# Patient Record
Sex: Female | Born: 1935
Health system: Southern US, Community
[De-identification: ages and names within clinical notes are randomized; demographics above are authoritative.]

## PROBLEM LIST (undated history)

## (undated) DIAGNOSIS — M6790 Unspecified disorder of synovium and tendon, unspecified site: Secondary | ICD-10-CM

## (undated) DIAGNOSIS — E039 Hypothyroidism, unspecified: Secondary | ICD-10-CM

## (undated) DIAGNOSIS — I160 Hypertensive urgency: Secondary | ICD-10-CM

## (undated) DIAGNOSIS — T8459XA Infection and inflammatory reaction due to other internal joint prosthesis, initial encounter: Secondary | ICD-10-CM

## (undated) DIAGNOSIS — F410 Panic disorder [episodic paroxysmal anxiety] without agoraphobia: Secondary | ICD-10-CM

## (undated) DIAGNOSIS — Z96649 Presence of unspecified artificial hip joint: Secondary | ICD-10-CM

## (undated) DIAGNOSIS — J40 Bronchitis, not specified as acute or chronic: Secondary | ICD-10-CM

## (undated) DIAGNOSIS — M9701XA Periprosthetic fracture around internal prosthetic right hip joint, initial encounter: Secondary | ICD-10-CM

## (undated) DIAGNOSIS — Z8719 Personal history of other diseases of the digestive system: Secondary | ICD-10-CM

## (undated) DIAGNOSIS — R112 Nausea with vomiting, unspecified: Secondary | ICD-10-CM

## (undated) DIAGNOSIS — M7989 Other specified soft tissue disorders: Secondary | ICD-10-CM

## (undated) DIAGNOSIS — T8859XA Other complications of anesthesia, initial encounter: Secondary | ICD-10-CM

## (undated) DIAGNOSIS — E785 Hyperlipidemia, unspecified: Secondary | ICD-10-CM

## (undated) DIAGNOSIS — T847XXA Infection and inflammatory reaction due to other internal orthopedic prosthetic devices, implants and grafts, initial encounter: Secondary | ICD-10-CM

## (undated) DIAGNOSIS — M1990 Unspecified osteoarthritis, unspecified site: Secondary | ICD-10-CM

## (undated) DIAGNOSIS — I4891 Unspecified atrial fibrillation: Principal | ICD-10-CM

## (undated) DIAGNOSIS — I10 Essential (primary) hypertension: Secondary | ICD-10-CM

## (undated) DIAGNOSIS — E119 Type 2 diabetes mellitus without complications: Secondary | ICD-10-CM

## (undated) DIAGNOSIS — A498 Other bacterial infections of unspecified site: Secondary | ICD-10-CM

## (undated) DIAGNOSIS — G4700 Insomnia, unspecified: Secondary | ICD-10-CM

## (undated) DIAGNOSIS — K219 Gastro-esophageal reflux disease without esophagitis: Secondary | ICD-10-CM

## (undated) DIAGNOSIS — G709 Myoneural disorder, unspecified: Secondary | ICD-10-CM

## (undated) DIAGNOSIS — F32A Depression, unspecified: Secondary | ICD-10-CM

## (undated) DIAGNOSIS — T4145XA Adverse effect of unspecified anesthetic, initial encounter: Secondary | ICD-10-CM

## (undated) DIAGNOSIS — Z9289 Personal history of other medical treatment: Secondary | ICD-10-CM

## (undated) DIAGNOSIS — S7290XA Unspecified fracture of unspecified femur, initial encounter for closed fracture: Secondary | ICD-10-CM

## (undated) DIAGNOSIS — B029 Zoster without complications: Secondary | ICD-10-CM

## (undated) DIAGNOSIS — S72141A Displaced intertrochanteric fracture of right femur, initial encounter for closed fracture: Secondary | ICD-10-CM

## (undated) DIAGNOSIS — C44300 Unspecified malignant neoplasm of skin of unspecified part of face: Secondary | ICD-10-CM

## (undated) DIAGNOSIS — Z9889 Other specified postprocedural states: Secondary | ICD-10-CM

## (undated) DIAGNOSIS — F329 Major depressive disorder, single episode, unspecified: Secondary | ICD-10-CM

## (undated) DIAGNOSIS — Z974 Presence of external hearing-aid: Secondary | ICD-10-CM

## (undated) HISTORY — DX: Other bacterial infections of unspecified site: A49.8

## (undated) HISTORY — DX: Infection and inflammatory reaction due to other internal orthopedic prosthetic devices, implants and grafts, initial encounter: T84.7XXA

## (undated) HISTORY — DX: Presence of unspecified artificial hip joint: Z96.649

## (undated) HISTORY — DX: Hypertensive urgency: I16.0

## (undated) HISTORY — DX: Unspecified disorder of synovium and tendon, unspecified site: M67.90

## (undated) HISTORY — PX: BREAST BIOPSY: SHX20

## (undated) HISTORY — PX: CARPAL TUNNEL RELEASE: SHX101

## (undated) HISTORY — PX: EYE SURGERY: SHX253

## (undated) HISTORY — PX: JOINT REPLACEMENT: SHX530

## (undated) HISTORY — DX: Infection and inflammatory reaction due to other internal joint prosthesis, initial encounter: T84.59XA

---
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
## 1998-12-12 HISTORY — PX: CATARACT EXTRACTION, BILATERAL: SHX1313

## 1999-04-12 ENCOUNTER — Ambulatory Visit (HOSPITAL_COMMUNITY): Admission: RE | Admit: 1999-04-12 | Discharge: 1999-04-12 | Payer: Self-pay | Admitting: Specialist

## 1999-06-21 ENCOUNTER — Ambulatory Visit (HOSPITAL_COMMUNITY): Admission: RE | Admit: 1999-06-21 | Discharge: 1999-06-21 | Payer: Self-pay | Admitting: Specialist

## 1999-08-13 DIAGNOSIS — C44300 Unspecified malignant neoplasm of skin of unspecified part of face: Secondary | ICD-10-CM

## 1999-08-13 HISTORY — DX: Unspecified malignant neoplasm of skin of unspecified part of face: C44.300

## 1999-08-13 HISTORY — PX: SKIN CANCER EXCISION: SHX779

## 2000-05-05 ENCOUNTER — Other Ambulatory Visit: Admission: RE | Admit: 2000-05-05 | Discharge: 2000-05-05 | Payer: Self-pay | Admitting: *Deleted

## 2001-02-14 ENCOUNTER — Ambulatory Visit (HOSPITAL_COMMUNITY): Admission: RE | Admit: 2001-02-14 | Discharge: 2001-02-14 | Payer: Self-pay | Admitting: Internal Medicine

## 2001-02-14 ENCOUNTER — Encounter: Payer: Self-pay | Admitting: Internal Medicine

## 2003-02-27 ENCOUNTER — Encounter: Payer: Self-pay | Admitting: Internal Medicine

## 2003-02-27 ENCOUNTER — Ambulatory Visit (HOSPITAL_COMMUNITY): Admission: RE | Admit: 2003-02-27 | Discharge: 2003-02-27 | Payer: Self-pay | Admitting: Internal Medicine

## 2003-12-13 HISTORY — PX: TOTAL KNEE ARTHROPLASTY: SHX125

## 2004-09-29 ENCOUNTER — Inpatient Hospital Stay (HOSPITAL_COMMUNITY): Admission: RE | Admit: 2004-09-29 | Discharge: 2004-10-02 | Payer: Self-pay | Admitting: Orthopedic Surgery

## 2004-09-29 ENCOUNTER — Ambulatory Visit: Payer: Self-pay | Admitting: Physical Medicine & Rehabilitation

## 2004-10-02 ENCOUNTER — Inpatient Hospital Stay
Admission: RE | Admit: 2004-10-02 | Discharge: 2004-10-07 | Payer: Self-pay | Admitting: Physical Medicine & Rehabilitation

## 2004-12-07 HISTORY — PX: OTHER SURGICAL HISTORY: SHX169

## 2004-12-14 ENCOUNTER — Ambulatory Visit (HOSPITAL_COMMUNITY): Admission: RE | Admit: 2004-12-14 | Discharge: 2004-12-14 | Payer: Self-pay | Admitting: *Deleted

## 2004-12-23 ENCOUNTER — Ambulatory Visit (HOSPITAL_COMMUNITY): Admission: RE | Admit: 2004-12-23 | Discharge: 2004-12-23 | Payer: Self-pay | Admitting: *Deleted

## 2006-01-03 ENCOUNTER — Ambulatory Visit (HOSPITAL_COMMUNITY): Admission: RE | Admit: 2006-01-03 | Discharge: 2006-01-03 | Payer: Self-pay | Admitting: Internal Medicine

## 2011-12-07 HISTORY — PX: DOPPLER ECHOCARDIOGRAPHY: SHX263

## 2011-12-07 HISTORY — PX: CARDIOVASCULAR STRESS TEST: SHX262

## 2011-12-13 DIAGNOSIS — J40 Bronchitis, not specified as acute or chronic: Secondary | ICD-10-CM

## 2011-12-13 HISTORY — DX: Bronchitis, not specified as acute or chronic: J40

## 2011-12-19 ENCOUNTER — Encounter (HOSPITAL_COMMUNITY): Payer: Self-pay

## 2011-12-26 ENCOUNTER — Other Ambulatory Visit (HOSPITAL_COMMUNITY): Payer: Self-pay

## 2011-12-30 ENCOUNTER — Ambulatory Visit (HOSPITAL_COMMUNITY): Admission: RE | Admit: 2011-12-30 | Payer: Medicare Other | Source: Ambulatory Visit | Admitting: Orthopedic Surgery

## 2011-12-30 ENCOUNTER — Encounter (HOSPITAL_COMMUNITY): Admission: RE | Payer: Self-pay | Source: Ambulatory Visit

## 2011-12-30 SURGERY — ARTHROPLASTY, KNEE, TOTAL
Anesthesia: Regional | Laterality: Left

## 2012-02-20 ENCOUNTER — Encounter (HOSPITAL_COMMUNITY): Payer: Self-pay | Admitting: Pharmacy Technician

## 2012-02-22 ENCOUNTER — Other Ambulatory Visit: Payer: Self-pay | Admitting: Physician Assistant

## 2012-02-22 NOTE — Pre-Procedure Instructions (Signed)
Leah Holland  02/22/2012   Your procedure is scheduled on:  Friday March 22  Report to [facility] at 5:30 AM.  Call this number if you have problems the morning of surgery: [phone]   Remember:   Do not eat food:After Midnight.  May have clear liquids: up to 4 Hours before arrival.  Clear liquids include soda, tea, black coffee, apple or grape juice, broth.  Take these medicines the morning of surgery with A SIP OF WATER: Amlodipine, Norco (pain pill), Synthroid, Metoprolol, Prilosec   Do not wear jewelry, make-up or nail polish.  Do not wear lotions, powders, or perfumes. You may wear deodorant.  Do not shave 48 hours prior to surgery.  Do not bring valuables to the hospital.  Contacts, dentures or bridgework may not be worn into surgery.  Leave suitcase in the car. After surgery it may be brought to your room.  For patients admitted to the hospital, checkout time is 11:00 AM the day of discharge.   Patients discharged the day of surgery will not be allowed to drive home.  Name and phone number of your driver: Venie Montesinos [phone]  Special Instructions: Incentive Spirometry - Practice and bring it with you on the day of surgery. and CHG Shower Use Special Wash: 1/2 bottle night before surgery and 1/2 bottle morning of surgery.   Please read over the following fact sheets that you were given: Pain Booklet, Coughing and Deep Breathing, Blood Transfusion Information, Total Joint Packet and Surgical Site Infection Prevention

## 2012-02-23 ENCOUNTER — Ambulatory Visit (HOSPITAL_COMMUNITY)
Admission: RE | Admit: 2012-02-23 | Discharge: 2012-02-23 | Disposition: A | Discharge status: Home / Self Care | Payer: Medicare Other | Source: Ambulatory Visit | Attending: Anesthesiology | Admitting: Anesthesiology

## 2012-02-23 ENCOUNTER — Encounter (HOSPITAL_COMMUNITY)
Admission: RE | Admit: 2012-02-23 | Discharge: 2012-02-23 | Disposition: A | Discharge status: Home / Self Care | Payer: Medicare Other | Source: Ambulatory Visit | Attending: Orthopedic Surgery | Admitting: Orthopedic Surgery

## 2012-02-23 ENCOUNTER — Encounter (HOSPITAL_COMMUNITY): Payer: Self-pay

## 2012-02-23 DIAGNOSIS — Z01812 Encounter for preprocedural laboratory examination: Secondary | ICD-10-CM | POA: Insufficient documentation

## 2012-02-23 DIAGNOSIS — I10 Essential (primary) hypertension: Secondary | ICD-10-CM | POA: Insufficient documentation

## 2012-02-23 DIAGNOSIS — Z01818 Encounter for other preprocedural examination: Secondary | ICD-10-CM | POA: Insufficient documentation

## 2012-02-23 DIAGNOSIS — K219 Gastro-esophageal reflux disease without esophagitis: Secondary | ICD-10-CM | POA: Insufficient documentation

## 2012-02-23 HISTORY — DX: Major depressive disorder, single episode, unspecified: F32.9

## 2012-02-23 HISTORY — DX: Unspecified osteoarthritis, unspecified site: M19.90

## 2012-02-23 HISTORY — DX: Adverse effect of unspecified anesthetic, initial encounter: T41.45XA

## 2012-02-23 HISTORY — DX: Other complications of anesthesia, initial encounter: T88.59XA

## 2012-02-23 HISTORY — DX: Gastro-esophageal reflux disease without esophagitis: K21.9

## 2012-02-23 HISTORY — DX: Myoneural disorder, unspecified: G70.9

## 2012-02-23 HISTORY — DX: Bronchitis, not specified as acute or chronic: J40

## 2012-02-23 HISTORY — DX: Hypothyroidism, unspecified: E03.9

## 2012-02-23 HISTORY — DX: Hyperlipidemia, unspecified: E78.5

## 2012-02-23 HISTORY — DX: Other specified soft tissue disorders: M79.89

## 2012-02-23 HISTORY — DX: Nausea with vomiting, unspecified: Z98.890

## 2012-02-23 HISTORY — DX: Insomnia, unspecified: G47.00

## 2012-02-23 HISTORY — DX: Nausea with vomiting, unspecified: R11.2

## 2012-02-23 HISTORY — DX: Essential (primary) hypertension: I10

## 2012-02-23 HISTORY — DX: Depression, unspecified: F32.A

## 2012-02-23 LAB — APTT: aPTT: 27 seconds (ref 24–37)

## 2012-02-23 LAB — DIFFERENTIAL
Basophils Relative: 1 % (ref 0–1)
Lymphocytes Relative: 39 % (ref 12–46)
Lymphs Abs: 3.1 10*3/uL (ref 0.7–4.0)
Monocytes Absolute: 0.6 10*3/uL (ref 0.1–1.0)
Monocytes Relative: 7 % (ref 3–12)
Neutro Abs: 4 10*3/uL (ref 1.7–7.7)
Neutrophils Relative %: 49 % (ref 43–77)

## 2012-02-23 LAB — PROTIME-INR: INR: 0.92 (ref 0.00–1.49)

## 2012-02-23 LAB — URINALYSIS, ROUTINE W REFLEX MICROSCOPIC
Bilirubin Urine: NEGATIVE
Ketones, ur: NEGATIVE mg/dL
Nitrite: NEGATIVE
Protein, ur: NEGATIVE mg/dL
pH: 7 (ref 5.0–8.0)

## 2012-02-23 LAB — COMPREHENSIVE METABOLIC PANEL
AST: 19 U/L (ref 0–37)
Albumin: 4.1 g/dL (ref 3.5–5.2)
BUN: 19 mg/dL (ref 6–23)
Chloride: 99 mEq/L (ref 96–112)
Creatinine, Ser: 0.84 mg/dL (ref 0.50–1.10)
Total Protein: 7.5 g/dL (ref 6.0–8.3)

## 2012-02-23 LAB — SURGICAL PCR SCREEN
MRSA, PCR: NEGATIVE
Staphylococcus aureus: NEGATIVE

## 2012-02-23 LAB — CBC
HCT: 43.2 % (ref 36.0–46.0)
MCHC: 34.5 g/dL (ref 30.0–36.0)
MCV: 90.8 fL (ref 78.0–100.0)
Platelets: 235 10*3/uL (ref 150–400)
RDW: 13.5 % (ref 11.5–15.5)
WBC: 8 10*3/uL (ref 4.0–10.5)

## 2012-02-23 NOTE — Progress Notes (Signed)
Requested stress test, echo, ekg, and last office visit notes from Dr. Larey Seat (cardiology)  office [phone].

## 2012-02-24 LAB — URINE CULTURE
Colony Count: 15000
Culture  Setup Time: 201303141223

## 2012-03-01 MED ORDER — CEFAZOLIN SODIUM 1-5 GM-% IV SOLN
1.0000 g | INTRAVENOUS | Status: AC
  Administered 2012-03-02: 1 g via INTRAVENOUS
  Filled 2012-03-01: qty 50

## 2012-03-01 MED ORDER — ACETAMINOPHEN 10 MG/ML IV SOLN
1000.0000 mg | Freq: Four times a day (QID) | INTRAVENOUS | Status: DC
  Administered 2012-03-02: 1000 mg via INTRAVENOUS
  Filled 2012-03-01 (×4): qty 100

## 2012-03-01 NOTE — H&P (Signed)
 NAME: Leah Holland MRN: #[id number] DATE: February 16, 2012 DOB: 01/13/1922  CHIEF COMPLAINT: Follow-up left knee osteoarthritis.   HPI: The patient is an 78-year-old female with a history of left knee osteoarthritis. She has failed several conservative treatments, NSAIDs, steroid injections, visco-supplementation, and bracing. She is here today to discuss further possible total knee arthroplasty. The left knee pain is significantly affecting her quality of life and activities of daily living. It does awaken her from sleep at night.   CURRENT MEDICATION: Hydrocodone 5/325 one half to one p.r.n. pain, Synthroid 100 mg daily, atorvastatin 40 mg daily, benazepril HCL 40 mg daily, amlodipine 10 mg daily, Prilosec 20 mg daily, metoprolol 100 mg one half b.i.d., furosemide 40 mg 1-1/2 daily so a total of 60 mg, Klor-Con M20 2 daily, baby aspirin daily, Macular Protect Complete, Xanax 0.25 mg as needed for sleep, flaxseed oil 100 mg twice a day. No known drug allergies.   PAST MEDICAL HISTORY: Hypertension, cataracts, whooping cough, thyroid problems, and osteoarthritis of the knees.   PAST SURGICAL HISTORY: Hospitalizations x3 for childbirth in 1947, 1948 and 1950. Surgical history includes right total knee replacement by Dr. Caffrey in 2005. She is doing well with this. Also cataract surgeries x2 in 2000.   REVIEW OF SYSTEMS: A 10 point review of systems was obtained and is positive for cataracts, glasses/contacts, decreased hearing, ringing in the ears, dentures, high blood pressure, whooping cough, thyroid problems.   FAMILY HISTORY: Positive for heart attack in her mother, brother and sister. High blood pressure in mother and father. Stroke in her father and cancer in her brother.   SOCIAL HISTORY: The patient is a nonsmoker, nondrinker. She lives alone and is widowed. Two living children, one deceased.   PHYSICAL EXAM: The patient is seated in the exam room in no acute distress. Alert and oriented.  Appears appropriate age. Height 5 foot 9 inches, weight 145 pounds. BMI calculated at 21.5. Temperature 95.3, pulse 60, respiration 18, blood pressure 144/75. Skin: no rashes, no lesions. HEENT: pupils round and reactive to light. She does have upper and lower dentures and hearing aids in both ears. Neck is supple with good range of motion.   Chest: normal breath sounds. Lungs clear to auscultation bilaterally. Heart: regular rate and rhythm. No signs of murmur. Abdomen: soft, nontender, active bowel sounds in all quadrants. Rectal/breast: not indicated for surgery. Neuro: cranial nerves grossly intact. Musculoskeletal examination of the left lower extremity shows that she is neurovascularly intact. She has tenderness to palpation along the medial joint line. Positive patellofemoral crepitus. Limited extension of the knee.   Imaging: No images were obtained today. X-rays taken of the left knee on 06/20/2011 show bone-on-bone osteoarthritis.   ASSESSMENT:  1.    Left knee osteoarthritis end-stage. 2.    Hypertension. 3.    GERD. 4.    Hiatal hernia. 5.    Hypothyroidism.   PLAN: The risks and benefits of left total knee arthroplasty were discussed again today and patient wishes to proceed. She has a preadmission appointment scheduled on 02/23/2012. Surgery is scheduled for 03/02/2012. She may require some rehabilitation in a skilled nursing facility following surgery due to the fact that she does live alone. Otherwise Home Health would be set up with [facility]. She obtained cardiac clearance from her cardiologist Dr. Jonathan Barry, and medical clearance from her primary care physician Dr. Spear who had obtained chest x-ray showing no active lung disease, stable, slight hyperaeration as well as pulmonary function tests   show an FEV1 at 77% predicted. She was given preoperative instructions. We also discussed postoperative DVT prophylaxis and perioperative antibiotics. She will be given Lovenox and Ancef.  She was instructed to discontinue the baby aspirin 7-10 days prior to surgery. If she has any further questions or concerns prior to surgery she may contact our office.    Josh Abryana Lykens P.A.-C/[id number]  Auto-Authenticated by Josh Giordana Weinheimer P.A.-C 

## 2012-03-02 ENCOUNTER — Ambulatory Visit (HOSPITAL_COMMUNITY): Payer: Medicare Other

## 2012-03-02 ENCOUNTER — Encounter (HOSPITAL_COMMUNITY): Payer: Self-pay | Admitting: Anesthesiology

## 2012-03-02 ENCOUNTER — Encounter (HOSPITAL_COMMUNITY)
Admission: RE | Disposition: A | Discharge status: Skilled Nursing Facility | Payer: Self-pay | Source: Ambulatory Visit | Attending: Orthopedic Surgery

## 2012-03-02 ENCOUNTER — Ambulatory Visit (HOSPITAL_COMMUNITY): Payer: Medicare Other | Admitting: Anesthesiology

## 2012-03-02 ENCOUNTER — Encounter (HOSPITAL_COMMUNITY): Payer: Self-pay | Admitting: Surgery

## 2012-03-02 ENCOUNTER — Inpatient Hospital Stay (HOSPITAL_COMMUNITY)
Admission: RE | Admit: 2012-03-02 | Discharge: 2012-03-06 | DRG: 470 | Disposition: A | Discharge status: Skilled Nursing Facility | Payer: Medicare Other | Source: Ambulatory Visit | Attending: Orthopedic Surgery | Admitting: Orthopedic Surgery

## 2012-03-02 DIAGNOSIS — K449 Diaphragmatic hernia without obstruction or gangrene: Secondary | ICD-10-CM | POA: Diagnosis present

## 2012-03-02 DIAGNOSIS — K5900 Constipation, unspecified: Secondary | ICD-10-CM | POA: Diagnosis present

## 2012-03-02 DIAGNOSIS — M216X9 Other acquired deformities of unspecified foot: Secondary | ICD-10-CM | POA: Diagnosis present

## 2012-03-02 DIAGNOSIS — D62 Acute posthemorrhagic anemia: Secondary | ICD-10-CM | POA: Diagnosis not present

## 2012-03-02 DIAGNOSIS — Z823 Family history of stroke: Secondary | ICD-10-CM

## 2012-03-02 DIAGNOSIS — K219 Gastro-esophageal reflux disease without esophagitis: Secondary | ICD-10-CM | POA: Diagnosis present

## 2012-03-02 DIAGNOSIS — Z886 Allergy status to analgesic agent status: Secondary | ICD-10-CM

## 2012-03-02 DIAGNOSIS — Z79899 Other long term (current) drug therapy: Secondary | ICD-10-CM

## 2012-03-02 DIAGNOSIS — G4700 Insomnia, unspecified: Secondary | ICD-10-CM | POA: Diagnosis present

## 2012-03-02 DIAGNOSIS — E039 Hypothyroidism, unspecified: Secondary | ICD-10-CM | POA: Diagnosis present

## 2012-03-02 DIAGNOSIS — I10 Essential (primary) hypertension: Secondary | ICD-10-CM | POA: Diagnosis present

## 2012-03-02 DIAGNOSIS — M1990 Unspecified osteoarthritis, unspecified site: Secondary | ICD-10-CM | POA: Diagnosis present

## 2012-03-02 DIAGNOSIS — Z8249 Family history of ischemic heart disease and other diseases of the circulatory system: Secondary | ICD-10-CM

## 2012-03-02 DIAGNOSIS — Z85820 Personal history of malignant melanoma of skin: Secondary | ICD-10-CM

## 2012-03-02 DIAGNOSIS — M1710 Unilateral primary osteoarthritis, unspecified knee: Principal | ICD-10-CM | POA: Diagnosis present

## 2012-03-02 HISTORY — PX: TOTAL KNEE ARTHROPLASTY: SHX125

## 2012-03-02 SURGERY — ARTHROPLASTY, KNEE, TOTAL
Anesthesia: General | Site: Knee | Laterality: Left | Wound class: Clean

## 2012-03-02 MED ORDER — POTASSIUM CHLORIDE CRYS ER 20 MEQ PO TBCR
20.0000 meq | EXTENDED_RELEASE_TABLET | Freq: Two times a day (BID) | ORAL | Status: DC
  Administered 2012-03-02 – 2012-03-06 (×9): 20 meq via ORAL
  Filled 2012-03-02 (×12): qty 1

## 2012-03-02 MED ORDER — POLYETHYLENE GLYCOL 3350 17 G PO PACK: 17.0000 g | PACK | Freq: Every day | ORAL | Status: DC | PRN

## 2012-03-02 MED ORDER — GLYCOPYRROLATE 0.2 MG/ML IJ SOLN
INTRAMUSCULAR | Status: DC | PRN
  Administered 2012-03-02: .5 mg via INTRAVENOUS

## 2012-03-02 MED ORDER — PROPOFOL 10 MG/ML IV EMUL
INTRAVENOUS | Status: DC | PRN
  Administered 2012-03-02: 90 mg via INTRAVENOUS

## 2012-03-02 MED ORDER — MIDAZOLAM HCL 5 MG/5ML IJ SOLN
INTRAMUSCULAR | Status: DC | PRN
  Administered 2012-03-02: 1 mg via INTRAVENOUS

## 2012-03-02 MED ORDER — EPHEDRINE SULFATE 50 MG/ML IJ SOLN
INTRAMUSCULAR | Status: DC | PRN
  Administered 2012-03-02 (×2): 10 mg via INTRAVENOUS

## 2012-03-02 MED ORDER — LACTATED RINGERS IV SOLN: INTRAVENOUS | Status: DC

## 2012-03-02 MED ORDER — ALPRAZOLAM 0.25 MG PO TABS
0.1250 mg | ORAL_TABLET | Freq: Every evening | ORAL | Status: DC | PRN
  Administered 2012-03-02: 0.125 mg via ORAL
  Filled 2012-03-02: qty 1

## 2012-03-02 MED ORDER — LACTATED RINGERS IV SOLN
INTRAVENOUS | Status: DC | PRN
  Administered 2012-03-02: 07:00:00 via INTRAVENOUS

## 2012-03-02 MED ORDER — METOPROLOL TARTRATE 50 MG PO TABS
50.0000 mg | ORAL_TABLET | Freq: Two times a day (BID) | ORAL | Status: DC
  Administered 2012-03-02 – 2012-03-06 (×8): 50 mg via ORAL
  Filled 2012-03-02 (×10): qty 1

## 2012-03-02 MED ORDER — PANTOPRAZOLE SODIUM 40 MG PO TBEC
40.0000 mg | DELAYED_RELEASE_TABLET | Freq: Every day | ORAL | Status: DC
  Administered 2012-03-03 – 2012-03-05 (×3): 40 mg via ORAL
  Filled 2012-03-02 (×4): qty 1

## 2012-03-02 MED ORDER — CHLORHEXIDINE GLUCONATE 4 % EX LIQD: 60.0000 mL | Freq: Once | CUTANEOUS | Status: DC

## 2012-03-02 MED ORDER — ACETAMINOPHEN 10 MG/ML IV SOLN
1000.0000 mg | Freq: Four times a day (QID) | INTRAVENOUS | Status: DC
  Administered 2012-03-02: 1000 mg via INTRAVENOUS
  Filled 2012-03-02 (×5): qty 100

## 2012-03-02 MED ORDER — ROCURONIUM BROMIDE 100 MG/10ML IV SOLN
INTRAVENOUS | Status: DC | PRN
  Administered 2012-03-02: 50 mg via INTRAVENOUS

## 2012-03-02 MED ORDER — NEOSTIGMINE METHYLSULFATE 1 MG/ML IJ SOLN
INTRAMUSCULAR | Status: DC | PRN
  Administered 2012-03-02: 3 mg via INTRAVENOUS

## 2012-03-02 MED ORDER — ONDANSETRON HCL 4 MG/2ML IJ SOLN
INTRAMUSCULAR | Status: DC | PRN
  Administered 2012-03-02: 4 mg via INTRAVENOUS

## 2012-03-02 MED ORDER — CEFAZOLIN SODIUM 1-5 GM-% IV SOLN
1.0000 g | Freq: Four times a day (QID) | INTRAVENOUS | Status: AC
  Administered 2012-03-02 – 2012-03-03 (×3): 1 g via INTRAVENOUS
  Filled 2012-03-02 (×3): qty 50

## 2012-03-02 MED ORDER — ATORVASTATIN CALCIUM 40 MG PO TABS
40.0000 mg | ORAL_TABLET | Freq: Every day | ORAL | Status: DC
  Administered 2012-03-02 – 2012-03-06 (×5): 40 mg via ORAL
  Filled 2012-03-02 (×5): qty 1

## 2012-03-02 MED ORDER — AMLODIPINE BESYLATE 10 MG PO TABS
10.0000 mg | ORAL_TABLET | Freq: Every day | ORAL | Status: DC
  Administered 2012-03-03 – 2012-03-06 (×4): 10 mg via ORAL
  Filled 2012-03-02 (×5): qty 1

## 2012-03-02 MED ORDER — SODIUM CHLORIDE 0.9 % IR SOLN
Status: DC | PRN
  Administered 2012-03-02: 1000 mL

## 2012-03-02 MED ORDER — FUROSEMIDE 40 MG PO TABS
60.0000 mg | ORAL_TABLET | Freq: Every day | ORAL | Status: DC
  Administered 2012-03-02 – 2012-03-06 (×5): 60 mg via ORAL
  Filled 2012-03-02 (×5): qty 1

## 2012-03-02 MED ORDER — METOCLOPRAMIDE HCL 10 MG PO TABS: 5.0000 mg | ORAL_TABLET | Freq: Three times a day (TID) | ORAL | Status: DC | PRN

## 2012-03-02 MED ORDER — OXYCODONE HCL 5 MG PO TABS
5.0000 mg | ORAL_TABLET | ORAL | Status: DC | PRN
  Administered 2012-03-02 (×2): 5 mg via ORAL
  Administered 2012-03-02 – 2012-03-03 (×3): 10 mg via ORAL
  Filled 2012-03-02 (×3): qty 2
  Filled 2012-03-02 (×2): qty 1

## 2012-03-02 MED ORDER — MENTHOL 3 MG MT LOZG
1.0000 | LOZENGE | OROMUCOSAL | Status: DC | PRN
  Filled 2012-03-02: qty 9

## 2012-03-02 MED ORDER — ACETAMINOPHEN 10 MG/ML IV SOLN
INTRAVENOUS | Status: AC
  Filled 2012-03-02: qty 100

## 2012-03-02 MED ORDER — BUPIVACAINE-EPINEPHRINE PF 0.5-1:200000 % IJ SOLN
INTRAMUSCULAR | Status: DC | PRN
  Administered 2012-03-02: 30 mL

## 2012-03-02 MED ORDER — DOCUSATE SODIUM 100 MG PO CAPS
100.0000 mg | ORAL_CAPSULE | Freq: Two times a day (BID) | ORAL | Status: DC
  Administered 2012-03-02 (×2): 100 mg via ORAL
  Filled 2012-03-02 (×4): qty 1

## 2012-03-02 MED ORDER — SUFENTANIL CITRATE 50 MCG/ML IV SOLN
INTRAVENOUS | Status: DC | PRN
  Administered 2012-03-02: 20 ug via INTRAVENOUS
  Administered 2012-03-02: 10 ug via INTRAVENOUS

## 2012-03-02 MED ORDER — SODIUM CHLORIDE 0.9 % IV SOLN: INTRAVENOUS | Status: DC

## 2012-03-02 MED ORDER — SCOPOLAMINE 1 MG/3DAYS TD PT72
1.0000 | MEDICATED_PATCH | TRANSDERMAL | Status: AC
  Administered 2012-03-02: 1 via TRANSDERMAL
  Filled 2012-03-02: qty 1

## 2012-03-02 MED ORDER — SODIUM CHLORIDE 0.9 % IV SOLN
INTRAVENOUS | Status: DC
  Administered 2012-03-02: 16:00:00 via INTRAVENOUS

## 2012-03-02 MED ORDER — LEVOTHYROXINE SODIUM 100 MCG PO TABS
100.0000 ug | ORAL_TABLET | Freq: Every day | ORAL | Status: DC
  Administered 2012-03-03 – 2012-03-06 (×4): 100 ug via ORAL
  Filled 2012-03-02 (×5): qty 1

## 2012-03-02 MED ORDER — METOCLOPRAMIDE HCL 5 MG/ML IJ SOLN: 5.0000 mg | Freq: Three times a day (TID) | INTRAMUSCULAR | Status: DC | PRN

## 2012-03-02 MED ORDER — ONDANSETRON HCL 4 MG PO TABS: 4.0000 mg | ORAL_TABLET | Freq: Four times a day (QID) | ORAL | Status: DC | PRN

## 2012-03-02 MED ORDER — BENAZEPRIL HCL 40 MG PO TABS
40.0000 mg | ORAL_TABLET | Freq: Every day | ORAL | Status: DC
  Administered 2012-03-03 – 2012-03-06 (×4): 40 mg via ORAL
  Filled 2012-03-02 (×5): qty 1

## 2012-03-02 MED ORDER — ENOXAPARIN SODIUM 30 MG/0.3ML ~~LOC~~ SOLN
30.0000 mg | Freq: Two times a day (BID) | SUBCUTANEOUS | Status: DC
  Administered 2012-03-02 – 2012-03-06 (×8): 30 mg via SUBCUTANEOUS
  Filled 2012-03-02 (×9): qty 0.3

## 2012-03-02 MED ORDER — PHENOL 1.4 % MT LIQD: 1.0000 | OROMUCOSAL | Status: DC | PRN

## 2012-03-02 MED ORDER — FLEET ENEMA 7-19 GM/118ML RE ENEM: 1.0000 | ENEMA | Freq: Once | RECTAL | Status: AC | PRN

## 2012-03-02 MED ORDER — HYDROMORPHONE HCL PF 1 MG/ML IJ SOLN
0.2500 mg | INTRAMUSCULAR | Status: DC | PRN
  Administered 2012-03-02: 0.5 mg via INTRAVENOUS

## 2012-03-02 MED ORDER — ACETAMINOPHEN 10 MG/ML IV SOLN
1000.0000 mg | Freq: Four times a day (QID) | INTRAVENOUS | Status: AC
  Administered 2012-03-02 – 2012-03-03 (×3): 1000 mg via INTRAVENOUS
  Filled 2012-03-02 (×3): qty 100

## 2012-03-02 MED ORDER — ONDANSETRON HCL 4 MG/2ML IJ SOLN
4.0000 mg | Freq: Four times a day (QID) | INTRAMUSCULAR | Status: DC | PRN
  Administered 2012-03-03: 4 mg via INTRAVENOUS
  Filled 2012-03-02: qty 2

## 2012-03-02 MED ORDER — HETASTARCH-ELECTROLYTES 6 % IV SOLN
INTRAVENOUS | Status: DC | PRN
  Administered 2012-03-02: 08:00:00 via INTRAVENOUS

## 2012-03-02 SURGICAL SUPPLY — 60 items
BANDAGE ELASTIC 4 VELCRO ST LF (GAUZE/BANDAGES/DRESSINGS) ×1 IMPLANT
BANDAGE ELASTIC 6 VELCRO ST LF (GAUZE/BANDAGES/DRESSINGS) ×1 IMPLANT
BANDAGE ESMARK 6X9 LF (GAUZE/BANDAGES/DRESSINGS) ×1 IMPLANT
BLADE SAGITTAL 25.0X1.19X90 (BLADE) ×2 IMPLANT
BLADE SAW SAG 90X13X1.27 (BLADE) ×2 IMPLANT
BNDG CMPR 9X6 STRL LF SNTH (GAUZE/BANDAGES/DRESSINGS) ×1
BNDG ESMARK 6X9 LF (GAUZE/BANDAGES/DRESSINGS) ×2
BOWL SMART MIX CTS (DISPOSABLE) ×2 IMPLANT
CEMENT HV SMART SET (Cement) ×4 IMPLANT
CLOTH BEACON ORANGE TIMEOUT ST (SAFETY) ×2 IMPLANT
COVER BACK TABLE 24X17X13 BIG (DRAPES) IMPLANT
COVER SURGICAL LIGHT HANDLE (MISCELLANEOUS) ×2 IMPLANT
CUFF TOURNIQUET SINGLE 34IN LL (TOURNIQUET CUFF) ×2 IMPLANT
CUFF TOURNIQUET SINGLE 44IN (TOURNIQUET CUFF) IMPLANT
DRAPE INCISE IOBAN 66X45 STRL (DRAPES) ×1 IMPLANT
DRAPE ORTHO SPLIT 77X108 STRL (DRAPES) ×4
DRAPE SURG ORHT 6 SPLT 77X108 (DRAPES) ×2 IMPLANT
DRAPE U-SHAPE 47X51 STRL (DRAPES) ×2 IMPLANT
DRSG ADAPTIC 3X8 NADH LF (GAUZE/BANDAGES/DRESSINGS) ×2 IMPLANT
DRSG PAD ABDOMINAL 8X10 ST (GAUZE/BANDAGES/DRESSINGS) ×2 IMPLANT
DURAPREP 26ML APPLICATOR (WOUND CARE) ×2 IMPLANT
ELECT REM PT RETURN 9FT ADLT (ELECTROSURGICAL) ×2
ELECTRODE REM PT RTRN 9FT ADLT (ELECTROSURGICAL) ×1 IMPLANT
EVACUATOR 1/8 PVC DRAIN (DRAIN) ×2 IMPLANT
FACESHIELD LNG OPTICON STERILE (SAFETY) ×3 IMPLANT
FLOSEAL 10ML (HEMOSTASIS) IMPLANT
GLOVE BIOGEL PI IND STRL 8 (GLOVE) ×4 IMPLANT
GLOVE BIOGEL PI INDICATOR 8 (GLOVE) ×4
GLOVE ORTHO TXT STRL SZ7.5 (GLOVE) ×6 IMPLANT
GLOVE SURG ORTHO 8.0 STRL STRW (GLOVE) ×6 IMPLANT
GOWN PREVENTION PLUS XLARGE (GOWN DISPOSABLE) ×5 IMPLANT
GOWN PREVENTION PLUS XXLARGE (GOWN DISPOSABLE) ×2 IMPLANT
GOWN STRL NON-REIN LRG LVL3 (GOWN DISPOSABLE) ×3 IMPLANT
HANDPIECE INTERPULSE COAX TIP (DISPOSABLE) ×2
HOOD PEEL AWAY FACE SHEILD DIS (HOOD) ×2 IMPLANT
IMMOBILIZER KNEE 22 UNIV (SOFTGOODS) ×1 IMPLANT
KIT BASIN OR (CUSTOM PROCEDURE TRAY) ×2 IMPLANT
KIT ROOM TURNOVER OR (KITS) ×2 IMPLANT
MANIFOLD NEPTUNE II (INSTRUMENTS) ×2 IMPLANT
NEEDLE 22X1 1/2 (OR ONLY) (NEEDLE) IMPLANT
NS IRRIG 1000ML POUR BTL (IV SOLUTION) ×2 IMPLANT
PACK TOTAL JOINT (CUSTOM PROCEDURE TRAY) ×2 IMPLANT
PAD ARMBOARD 7.5X6 YLW CONV (MISCELLANEOUS) ×4 IMPLANT
PAD CAST 4YDX4 CTTN HI CHSV (CAST SUPPLIES) ×1 IMPLANT
PADDING CAST COTTON 4X4 STRL (CAST SUPPLIES) ×2
PADDING CAST COTTON 6X4 STRL (CAST SUPPLIES) ×2 IMPLANT
SET HNDPC FAN SPRY TIP SCT (DISPOSABLE) ×1 IMPLANT
SPONGE GAUZE 4X4 12PLY (GAUZE/BANDAGES/DRESSINGS) ×2 IMPLANT
STAPLER VISISTAT 35W (STAPLE) ×2 IMPLANT
SUCTION FRAZIER TIP 10 FR DISP (SUCTIONS) ×2 IMPLANT
SUT ETHIBOND NAB CT1 #1 30IN (SUTURE) ×4 IMPLANT
SUT VIC AB 0 CT1 27 (SUTURE) ×4
SUT VIC AB 0 CT1 27XBRD ANBCTR (SUTURE) ×2 IMPLANT
SUT VIC AB 2-0 CT1 27 (SUTURE) ×4
SUT VIC AB 2-0 CT1 TAPERPNT 27 (SUTURE) ×2 IMPLANT
SYR CONTROL 10ML LL (SYRINGE) IMPLANT
TOWEL OR 17X24 6PK STRL BLUE (TOWEL DISPOSABLE) ×2 IMPLANT
TOWEL OR 17X26 10 PK STRL BLUE (TOWEL DISPOSABLE) ×2 IMPLANT
TRAY FOLEY CATH 14FR (SET/KITS/TRAYS/PACK) ×2 IMPLANT
WATER STERILE IRR 1000ML POUR (IV SOLUTION) ×6 IMPLANT

## 2012-03-02 NOTE — Progress Notes (Signed)
Orthopedic Tech Progress Note Patient Details:  Leah Holland [birth year] [id number]  CPM Left Knee CPM Left Knee: On Left Knee Flexion (Degrees): 90  Left Knee Extension (Degrees): 0    [name] T 03/02/2012, 11:11 AM

## 2012-03-02 NOTE — Preoperative (Signed)
Beta Blockers   Reason not to administer Beta Blockers:pt took BB 450 today

## 2012-03-02 NOTE — Progress Notes (Signed)
Lunch relief provided by [employer].

## 2012-03-02 NOTE — Progress Notes (Signed)
Pt transferred to 5007 from PACU via bed. Pt A&O. PIV to L hand intact with LR @75 . CPM on left leg. ACE wrap to L leg clean, dry and intact. Hemovac to L leg intact. 2L O2 via [place]. Oriented pt to room and placed call bell in reach. Pt denied pain or concerns. Will continue to monitor pt closely.

## 2012-03-02 NOTE — Transfer of Care (Signed)
Immediate Anesthesia Transfer of Care Note  Patient: Leah Holland  Procedure(s) Performed: Procedure(s) (LRB): TOTAL KNEE ARTHROPLASTY (Left)  Patient Location: PACU  Anesthesia Type: General and Regional  Level of Consciousness: awake, alert , oriented, sedated and patient cooperative  Airway & Oxygen Therapy: Patient Spontanous Breathing and Patient connected to nasal cannula oxygen  Post-op Assessment: Report given to PACU RN, Post -op Vital signs reviewed and stable and Patient moving all extremities  Post vital signs: Reviewed and stable  Complications: No apparent anesthesia complications

## 2012-03-02 NOTE — Consult Note (Signed)
Clinical Social Work Department BRIEF PSYCHOSOCIAL ASSESSMENT 03/02/2012  Patient:  Leah Holland, Leah Holland     Account Number:  [id number]     Admit date:  03/02/2012  Clinical Social Worker:  Thomasene Mohair  Date/Time:  03/02/2012 03:00 PM  Referred by:  Physician  Date Referred:  03/02/2012 Referred for  SNF Placement   Other Referral:   Interview type:  Patient Other interview type:    PSYCHOSOCIAL DATA Living Status:  ALONE Admitted from facility:   Level of care:   Primary support name:  [name]/ [phone]  [place]/ [phone] Primary support relationship to patient:  FAMILY Degree of support available:   strong and local    CURRENT CONCERNS Current Concerns  Post-Acute Placement   Other Concerns:    SOCIAL WORK ASSESSMENT / PLAN CSW was referred to Pt to assist with dc to snf.  Pt has talked to [facility] and plans to go there for short-term rehab.  CSW contacted SNF and they are making arrangements for Pt.  CSW will continue to f/u to facilitate transfer at DC.   Assessment/plan status:   Other assessment/ plan:   Information/referral to community resources:    PATIENT'S/FAMILY'S RESPONSE TO PLAN OF CARE: Pt is very pleasant, has good insight into rehab needs, and is agreeable to short-term snf.     Frederico Hamman, LCSW [phone] covering for Bonnye Fava, LCSW

## 2012-03-02 NOTE — Op Note (Signed)
Dictated [phone]

## 2012-03-02 NOTE — Anesthesia Postprocedure Evaluation (Signed)
  Anesthesia Post-op Note  Patient: Leah Holland  Procedure(s) Performed: Procedure(s) (LRB): TOTAL KNEE ARTHROPLASTY (Left)  Patient Location: PACU  Anesthesia Type: GA combined with regional for post-op pain  Level of Consciousness: awake, alert  and oriented  Airway and Oxygen Therapy: Patient Spontanous Breathing and Patient connected to nasal cannula oxygen  Post-op Pain: none  Post-op Assessment: Post-op Vital signs reviewed, Patient's Cardiovascular Status Stable, Respiratory Function Stable, Patent Airway, No signs of Nausea or vomiting and Pain level controlled  Post-op Vital Signs: Reviewed and stable  Complications: No apparent anesthesia complications

## 2012-03-02 NOTE — Op Note (Signed)
NAME:  Leah Holland, Leah Holland              ACCOUNT NO.:  [id number]  MEDICAL RECORD NO.:  [id number]  LOCATION:  5007                         FACILITY:  [facility]  PHYSICIAN:  Dyke Brackett, M.D.    DATE OF BIRTH:  09-08-1922  DATE OF PROCEDURE: DATE OF DISCHARGE:                              OPERATIVE REPORT   INDICATIONS:  This is an 78 year old with intractable knee pain left, status post right total knee, followed for many years with knee with multiple injections, etc., not responding to conservative treatment, thought to be amenable for inpatient hospitalization for a left total knee replacement.  PREOPERATIVE DIAGNOSIS:  Severe osteoarthritis with varus deformity, left knee.  POSTOPERATIVE DIAGNOSIS:  Severe osteoarthritis with varus deformity, left knee.  OPERATION:  Left total knee replacement ([employer] Sigma knee, size 3 tibia and femur, 10-mm bearing, all poly patella).  SURGEON:  Dyke Brackett, MD  ASSISTANTS: 1. Margart Sickles, PA-C 2. Su Hilt, PA  TOURNIQUET TIME:  1 hour and 8 minutes.  DESCRIPTION OF PROCEDURE:  Sterile prep and drape, exsanguination of the leg after Foley catheter inserted and insufflation to 350.  A straight skin incision and medial parapatellar approach to the knee was made. Identified the most diseased varus compartment with stripping of the medial side of the knee including the MCL.  Small osteophytes were removed.  We then cut 2 mm below the most diseased medial compartment. Prior to that, we did a 10-mm, 5-degree valgus cut on the femur.  We balanced this at 0 degrees with a 10-mm spacer.  Attention was next directed to the femur.  We sized the femur to be a size 3.  We inserted the jig to set the rotation and the anterior- posterior cutting dimensions on the femur, followed by placement of 2 pins and then the final cutting block for the anterior and posterior cuts and chamfers.  These were accomplished without difficulty. Osteophytes were  removed from the posteromedial femur and capsule was stripped due to the varus deformity of the knee.  Attention was next directed to the tibia, size 3 tibia was elected with the keel being cut for that.  Trial tibia was placed.  We then cut the box for the femoral component and placed the femoral trial.  The patella was cut leaving about 60 mm of native patella for a 35-mm patella, all poly.  All trials were placed.  Full extension was obtained on the table.  We flexed easily to 90 with no tendency to bearing spin out. Good balancing between medial and lateral ligaments was obtained.  The trials were removed.  The pulsatile lavage was used to irrigate the femur, surfaces were dried.  All surfaces of the components were coated with cement.  Two batches of cement was placed on the components in the doughy state.  Final components were inserted with a trial bearing on the tibia.  Cement was allowed to harden.  Excess cement was removed. We then removed the trial bearing, tourniquet was released, no excessive cement was noted prior to this in the back of the knee nor any excess bleeding.  The final bearing was placed.  Once again, all parameters were deemed to be  acceptable.  After Hemovac was placed, drain was placed superolaterally and then closure was effected with 1 Ethibond, 2-0 Vicryl, and skin clips. Lightly compressive sterile dressing, knee immobilized and applied. Taken to recovery room in stable condition.     Dyke Brackett, M.D.     WDC/MEDQ  D:  03/02/2012  T:  03/02/2012  Job:  [id number]

## 2012-03-02 NOTE — Progress Notes (Signed)
a  CARE MANAGEMENT NOTE 03/02/2012  Patient:  Leah Holland, Leah Holland   Account Number:  [id number]  Date Initiated:  03/02/2012  Documentation initiated by:  Johny Shock  Subjective/Objective Assessment:   Order or rehab     Action/Plan:   Pt to d/c to SNF, CSW working with pt   Anticipated DC Date:  03/05/2012   Anticipated DC Plan:  SKILLED NURSING FACILITY         Choice offered to / List presented to:             Status of service:  Completed, signed off Medicare Important Message given?   (If response is "NO", the following Medicare IM given date fields will be blank) Date Medicare IM given:   Date Additional Medicare IM given:    Discharge Disposition:  SKILLED NURSING FACILITY  Per UR Regulation:  Reviewed for med. necessity/level of care/duration of stay  If discussed at Long Length of Stay Meetings, dates discussed:    Comments:

## 2012-03-02 NOTE — H&P (View-Only) (Signed)
NAME: Leah Holland MRN: #[id number] DATE: February 16, 2012 DOB: 1922-11-28  CHIEF COMPLAINT: Follow-up left knee osteoarthritis.   HPI: The patient is an 78 year old female with a history of left knee osteoarthritis. She has failed several conservative treatments, NSAIDs, steroid injections, visco-supplementation, and bracing. She is here today to discuss further possible total knee arthroplasty. The left knee pain is significantly affecting her quality of life and activities of daily living. It does awaken her from sleep at night.   CURRENT MEDICATION: Hydrocodone 5/325 one half to one p.r.n. pain, Synthroid 100 mg daily, atorvastatin 40 mg daily, benazepril HCL 40 mg daily, amlodipine 10 mg daily, Prilosec 20 mg daily, metoprolol 100 mg one half b.i.d., furosemide 40 mg 1-1/2 daily so a total of 60 mg, Klor-Con M20 2 daily, baby aspirin daily, Macular Protect Complete, Xanax 0.25 mg as needed for sleep, flaxseed oil 100 mg twice a day. No known drug allergies.   PAST MEDICAL HISTORY: Hypertension, cataracts, whooping cough, thyroid problems, and osteoarthritis of the knees.   PAST SURGICAL HISTORY: Hospitalizations x3 for childbirth in 27, 5 and 47. Surgical history includes right total knee replacement by Dr. Madelon Lips in 2005. She is doing well with this. Also cataract surgeries x2 in 2000.   REVIEW OF SYSTEMS: A 10 point review of systems was obtained and is positive for cataracts, glasses/contacts, decreased hearing, ringing in the ears, dentures, high blood pressure, whooping cough, thyroid problems.   FAMILY HISTORY: Positive for heart attack in her mother, brother and sister. High blood pressure in mother and father. Stroke in her father and cancer in her brother.   SOCIAL HISTORY: The patient is a nonsmoker, nondrinker. She lives alone and is widowed. Two living children, one deceased.   PHYSICAL EXAM: The patient is seated in the exam room in no acute distress. Alert and oriented.  Appears appropriate age. Height 5 foot 9 inches, weight 145 pounds. BMI calculated at 21.5. Temperature 95.3, pulse 60, respiration 18, blood pressure 144/75. Skin: no rashes, no lesions. HEENT: pupils round and reactive to light. She does have upper and lower dentures and hearing aids in both ears. Neck is supple with good range of motion.   Chest: normal breath sounds. Lungs clear to auscultation bilaterally. Heart: regular rate and rhythm. No signs of murmur. Abdomen: soft, nontender, active bowel sounds in all quadrants. Rectal/breast: not indicated for surgery. Neuro: cranial nerves grossly intact. Musculoskeletal examination of the left lower extremity shows that she is neurovascularly intact. She has tenderness to palpation along the medial joint line. Positive patellofemoral crepitus. Limited extension of the knee.   Imaging: No images were obtained today. X-rays taken of the left knee on 06/20/2011 show bone-on-bone osteoarthritis.   ASSESSMENT:  1.    Left knee osteoarthritis end-stage. 2.    Hypertension. 3.    GERD. 4.    Hiatal hernia. 5.    Hypothyroidism.   PLAN: The risks and benefits of left total knee arthroplasty were discussed again today and patient wishes to proceed. She has a preadmission appointment scheduled on 02/23/2012. Surgery is scheduled for 03/02/2012. She may require some rehabilitation in a skilled nursing facility following surgery due to the fact that she does live alone. Otherwise Home Health would be set up with [place]. She obtained cardiac clearance from her cardiologist Dr. York Ram, and medical clearance from her primary care physician Dr. Collins Scotland who had obtained chest x-ray showing no active lung disease, stable, slight hyperaeration as well as pulmonary function tests  show an FEV1 at 77% predicted. She was given preoperative instructions. We also discussed postoperative DVT prophylaxis and perioperative antibiotics. She will be given Lovenox and Ancef.  She was instructed to discontinue the baby aspirin 7-10 days prior to surgery. If she has any further questions or concerns prior to surgery she may contact our office.    Josh Javonn Gauger P.A.-C/[id number]  Auto-Authenticated by Estanislado Spire P.A.-C

## 2012-03-02 NOTE — Anesthesia Preprocedure Evaluation (Addendum)
Anesthesia Evaluation  Patient identified by MRN, date of birth, ID band Patient awake    Reviewed: Allergy & Precautions, H&P , NPO status , Patient's Chart, lab work & pertinent test results, reviewed documented beta blocker date and time   History of Anesthesia Complications (+) PONV  Airway Mallampati: II TM Distance: >3 FB Neck ROM: Full    Dental  (+) Edentulous Upper and Edentulous Lower   Pulmonary neg pulmonary ROS,  breath sounds clear to auscultation  Pulmonary exam normal       Cardiovascular hypertension, Pt. on medications and Pt. on home beta blockers Rhythm:Regular Rate:Normal  12/12 myoview:  Normal LVF, no ischemia, EF 86%   Neuro/Psych PSYCHIATRIC DISORDERS Depression negative neurological ROS     GI/Hepatic Neg liver ROS, GERD-  Medicated and Controlled,  Endo/Other  Hypothyroidism   Renal/GU negative Renal ROS     Musculoskeletal   Abdominal   Peds  Hematology negative hematology ROS (+)   Anesthesia Other Findings   Reproductive/Obstetrics                           Anesthesia Physical Anesthesia Plan  ASA: II  Anesthesia Plan: General   Post-op Pain Management:    Induction: Intravenous  Airway Management Planned: Oral ETT  Additional Equipment:   Intra-op Plan:   Post-operative Plan: Extubation in OR  Informed Consent: I have reviewed the patients History and Physical, chart, labs and discussed the procedure including the risks, benefits and alternatives for the proposed anesthesia with the patient or authorized representative who has indicated his/her understanding and acceptance.   Dental advisory given  Plan Discussed with: Surgeon and CRNA  Anesthesia Plan Comments: (Plan routine monitors, GETA with femoral nerve block for post op analgesia)        Anesthesia Quick Evaluation

## 2012-03-02 NOTE — Anesthesia Procedure Notes (Signed)
Anesthesia Regional Block:  Femoral nerve block  Pre-Anesthetic Checklist: ,, timeout performed, Correct Patient, Correct Site, Correct Laterality, Correct Procedure, Correct Position, site marked, Risks and benefits discussed,  Surgical consent,  Pre-op evaluation,  At surgeon's request and post-op pain management  Laterality: Left  Prep: chloraprep       Needles:  Injection technique: Single-shot  Needle Type: Stimulator Needle - 40     Needle Length:cm 4 cm Needle Gauge: 22 and 22 G    Additional Needles:  Procedures: nerve stimulator Femoral nerve block  Nerve Stimulator or Paresthesia:  Response: patellar twitch, 0.45 mA, 0.1 ms,   Additional Responses:   Narrative:  Start time: 03/02/2012 7:05 AM End time: 03/02/2012 7:39 AM Injection made incrementally with aspirations every 5 mL.  Performed by: Personally  Anesthesiologist: Sandford Craze, MD  Additional Notes: Pt identified in Holding room.  Monitors applied. Working IV access confirmed. Sterile prep L groin.  #22ga PNS to patellar twitch at 0.79mA threshold.  30cc 0.5% Bupivacaine with 1:200k epi injected incrementally after negative test dose.  Patient asymptomatic, VSS, no heme aspirated, tolerated well.   Sandford Craze, MD  Femoral nerve block

## 2012-03-02 NOTE — Interval H&P Note (Signed)
History and Physical Interval Note:  03/02/2012 7:18 AM  Leah Holland  has presented today for surgery, with the diagnosis of osteoarthritis left knee  The various methods of treatment have been discussed with the patient and family. After consideration of risks, benefits and other options for treatment, the patient has consented to  Procedure(s) (LRB): TOTAL KNEE ARTHROPLASTY (Left) as a surgical intervention .  The patients' history has been reviewed, patient examined, no change in status, stable for surgery.  I have reviewed the patients' chart and labs.  Questions were answered to the patient's satisfaction.     Gwynneth Fabio JR,W D

## 2012-03-03 DIAGNOSIS — E785 Hyperlipidemia, unspecified: Secondary | ICD-10-CM | POA: Insufficient documentation

## 2012-03-03 DIAGNOSIS — I10 Essential (primary) hypertension: Secondary | ICD-10-CM | POA: Diagnosis present

## 2012-03-03 DIAGNOSIS — E039 Hypothyroidism, unspecified: Secondary | ICD-10-CM | POA: Insufficient documentation

## 2012-03-03 DIAGNOSIS — R112 Nausea with vomiting, unspecified: Secondary | ICD-10-CM | POA: Insufficient documentation

## 2012-03-03 DIAGNOSIS — H269 Unspecified cataract: Secondary | ICD-10-CM | POA: Insufficient documentation

## 2012-03-03 DIAGNOSIS — K219 Gastro-esophageal reflux disease without esophagitis: Secondary | ICD-10-CM | POA: Diagnosis present

## 2012-03-03 DIAGNOSIS — D62 Acute posthemorrhagic anemia: Secondary | ICD-10-CM | POA: Diagnosis not present

## 2012-03-03 DIAGNOSIS — M1990 Unspecified osteoarthritis, unspecified site: Secondary | ICD-10-CM | POA: Diagnosis present

## 2012-03-03 DIAGNOSIS — M7989 Other specified soft tissue disorders: Secondary | ICD-10-CM | POA: Insufficient documentation

## 2012-03-03 DIAGNOSIS — G4700 Insomnia, unspecified: Secondary | ICD-10-CM | POA: Diagnosis present

## 2012-03-03 LAB — BASIC METABOLIC PANEL
CO2: 30 mEq/L (ref 19–32)
Calcium: 8.4 mg/dL (ref 8.4–10.5)
Glucose, Bld: 140 mg/dL — ABNORMAL HIGH (ref 70–99)
Potassium: 4 mEq/L (ref 3.5–5.1)
Sodium: 135 mEq/L (ref 135–145)

## 2012-03-03 LAB — CBC
Hemoglobin: 9.4 g/dL — ABNORMAL LOW (ref 12.0–15.0)
MCH: 29.7 pg (ref 26.0–34.0)
Platelets: 162 10*3/uL (ref 150–400)
RBC: 3.16 MIL/uL — ABNORMAL LOW (ref 3.87–5.11)
WBC: 9.4 10*3/uL (ref 4.0–10.5)

## 2012-03-03 MED ORDER — METOCLOPRAMIDE HCL 5 MG PO TABS
5.0000 mg | ORAL_TABLET | Freq: Three times a day (TID) | ORAL | Status: DC
  Administered 2012-03-03 – 2012-03-05 (×4): 10 mg via ORAL
  Administered 2012-03-05 – 2012-03-06 (×3): 5 mg via ORAL
  Filled 2012-03-03 (×12): qty 2

## 2012-03-03 MED ORDER — METOCLOPRAMIDE HCL 5 MG/ML IJ SOLN
5.0000 mg | Freq: Three times a day (TID) | INTRAMUSCULAR | Status: DC
  Filled 2012-03-03 (×9): qty 2

## 2012-03-03 MED ORDER — ALPRAZOLAM 0.25 MG PO TABS
0.2500 mg | ORAL_TABLET | Freq: Every evening | ORAL | Status: DC | PRN
  Administered 2012-03-06: 0.25 mg via ORAL
  Filled 2012-03-03: qty 1

## 2012-03-03 MED ORDER — BISACODYL 5 MG PO TBEC
10.0000 mg | DELAYED_RELEASE_TABLET | Freq: Every day | ORAL | Status: DC
  Administered 2012-03-03 – 2012-03-05 (×2): 10 mg via ORAL
  Filled 2012-03-03 (×2): qty 2

## 2012-03-03 MED ORDER — SODIUM CHLORIDE 0.9 % IV BOLUS (SEPSIS)
500.0000 mL | Freq: Once | INTRAVENOUS | Status: AC
  Administered 2012-03-03: 500 mL via INTRAVENOUS

## 2012-03-03 MED ORDER — DOCUSATE SODIUM 100 MG PO CAPS
100.0000 mg | ORAL_CAPSULE | Freq: Two times a day (BID) | ORAL | Status: DC
  Administered 2012-03-03 – 2012-03-06 (×6): 100 mg via ORAL
  Filled 2012-03-03 (×6): qty 1

## 2012-03-03 MED ORDER — SODIUM CHLORIDE 0.9 % IV BOLUS (SEPSIS)
500.0000 mL | Freq: Once | INTRAVENOUS | Status: AC
  Administered 2012-03-03: 1000 mL via INTRAVENOUS

## 2012-03-03 MED ORDER — OXYCODONE HCL 5 MG PO TABS
15.0000 mg | ORAL_TABLET | ORAL | Status: DC | PRN
  Administered 2012-03-03 – 2012-03-05 (×8): 15 mg via ORAL
  Filled 2012-03-03 (×8): qty 3

## 2012-03-03 NOTE — Progress Notes (Signed)
PT EVALUATION NOTE  03/03/12 0924  PT Visit Information  Last PT Received On 03/03/12  Patient Stated Goals  Goal #1 return to busy lifestyle  Precautions  Precautions Knee  Precaution Booklet Issued Yes (comment)  Precaution Comments discussed knee precautions  Required Braces or Orthoses No  Restrictions  Weight Bearing Restrictions Yes  LLE Weight Bearing WBAT  Home Living  Lives With Alone;Other (Comment) (pt. plans to go to SNF for rehab before home)  Receives Help From Family  Prior Function  Level of Independence Independent with basic ADLs;Independent with homemaking with ambulation;Independent with gait;Independent with transfers  Able to Take Stairs? Yes  Driving Yes  Cognition  Arousal/Alertness Awake/alert  Overall Cognitive Status Appears within functional limits for tasks assessed  Orientation Level Oriented X4  Sensation  Light Touch Appears Intact (lower legs)  Bed Mobility  Bed Mobility Yes  Supine to Sit 4: Min assist;HOB elevated (Comment degrees);With rails  Supine to Sit Details (indicate cue type and reason) safe technique cues  Transfers  Transfers Yes  Sit to Stand 1: +2 Total assist;Patient percentage (comment);From bed;With upper extremity assist;Other (comment) (pt. 70%)  Sit to Stand Details (indicate cue type and reason) vc's for safe technique and for sequence  Stand to Sit 3: Mod assist;To chair/3-in-1;With upper extremity assist;With armrests  Stand to Sit Details vc's for safe technique and controlled descent  Ambulation/Gait  Ambulation/Gait Yes  Ambulation/Gait Assistance 4: Min assist  Ambulation/Gait Assistance Details (indicate cue type and reason) vc's for safety and for gait sequence  Ambulation Distance (Feet) 12 Feet  Assistive device Rolling walker  Gait Pattern Step-to pattern;Decreased hip/knee flexion - left  RUE Assessment  RUE Assessment Not tested (appears grossly [facility] for use of RW)  LUE Assessment  LUE Assessment Not  tested (appears grossly [facility] for use of RW)  RLE Assessment  RLE Assessment WFL  LLE Assessment  LLE Assessment (good ankle pump; fair quad set; AAROM -10 to 50 degrees)  Exercises  Exercises Total Joint  Total Joint Exercises  Ankle Circles/Pumps AROM;Both;Supine;10 reps  Quad Sets AROM;Left;10 reps;Supine  Knee Flexion AAROM;Left;10 reps;Seated  PT - End of Session  Equipment Utilized During Treatment Gait belt  Activity Tolerance Patient tolerated treatment well;Patient limited by fatigue;Patient limited by pain  Patient left in chair;with call bell in reach  Nurse Communication Mobility status for transfers;Mobility status for ambulation;Weight bearing status  General  Behavior During Session [facility] for tasks performed  Cognition [facility] for tasks performed  PT Assessment  Clinical Impression Statement Pt. is s/p L TKR and has decreased functional mobility, gait, ROM and strength which necessitates PT.  PT Recommendation/Assessment Patient will need skilled PT in the acute care venue  PT Problem List Decreased strength;Decreased range of motion;Decreased activity tolerance;Decreased mobility;Decreased knowledge of use of DME;Decreased knowledge of precautions;Pain  Barriers to Discharge Decreased caregiver support  PT Therapy Diagnosis  Difficulty walking;Acute pain  PT Plan  PT Frequency 7X/week  PT Treatment/Interventions Functional mobility training;Therapeutic activities;Therapeutic exercise;Patient/family education  PT Recommendation  Follow Up Recommendations Skilled nursing facility  Equipment Recommended Defer to next venue  Individuals Consulted  Consulted and Agree with Results and Recommendations Patient  Acute Rehab PT Goals  PT Goal Formulation With patient  Time For Goal Achievement 7 days  Pt will go Supine/Side to Sit with supervision  PT Goal: Supine/Side to Sit - Progress Goal set today  Pt will go Sit to Supine/Side with supervision  PT Goal: Sit to Supine/Side  - Progress  Goal set today  Pt will go Sit to Stand with min assist  PT Goal: Sit to Stand - Progress Goal set today  Pt will Ambulate 51 - 150 feet;with min assist;with rolling walker  PT Goal: Ambulate - Progress Goal set today  Pt will Perform Home Exercise Program with supervision, verbal cues required/provided  PT Goal: Perform Home Exercise Program - Progress Goal set today   Weldon Picking PT Acute Rehab Services [phone] Beeper [phone]

## 2012-03-03 NOTE — Progress Notes (Signed)
[name], female   DOB: [birth year], 78 y.o.   MRN: [id number] [name]  MRN: [id number]  DOB/AGE:  14-Nov-1922 / 78 y.o.  1 Day Post-Op Procedure(s) (LRB): TOTAL KNEE ARTHROPLASTY (Left)    PROGRESS NOTE Subjective: Patient is alert, oriented, yes Nausea, no Vomiting, no passing gas, no Bowel Movement. Taking PO minimal Denies SOB, Chest or Calf Pain. Using Incentive Spirometer, PAS in place. Ambulate to the door, CPM 0-50 Patient reports pain as 6 on 0-10 scale  .    Objective: Vital signs in last 24 hours: Filed Vitals:   03/02/12 1255 03/02/12 1318 03/02/12 2251 03/03/12 0558  BP: 117/64  125/50 161/70  Pulse: 65  75 89  Temp:   97.9 F (36.6 C) 98.1 F (36.7 C)  TempSrc:   Oral   Resp: 16  17 18   SpO2: 92% 92% 98% 93%      Intake/Output from previous day: I/O last 3 completed shifts: In: 2570 [P.O.:220; I.V.:1800; IV Piggyback:550] Out: 1870 [Urine:970; Drains:700; Blood:200]   Intake/Output this shift:     LABORATORY DATA:  Basename 03/03/12 0500  WBC 9.4  HGB 9.4*  HCT 28.8*  PLT 162  NA 135  K 4.0  CL 100  CO2 30  BUN 12  CREATININE 0.95  GLUCOSE 140*  GLUCAP --  INR --  CALCIUM 8.4    Examination: ABD soft Neurovascular intact Sensation intact distally Intact pulses distally Dorsiflexion/Plantar flexion intact Incision: dressing C/D/I}  Assessment:   1 Day Post-Op Procedure(s) (LRB): TOTAL KNEE ARTHROPLASTY (Left) ADDITIONAL DIAGNOSIS:  Acute Blood Loss Anemia, Hypertension and GERD, constipation, Insomnia,   Plan: PT/OT WBAT, CPM 5/hrs day until ROM 0-90 degrees, then D/C CPM DVT Prophylaxis:  SCDx72hr  Lovenox DISCHARGE PLAN: Skilled Nursing Facility/Rehab DISCHARGE NEEDS: to SNF on Monday Colace, dulcolax, fluid bolus,saline lock IV, dressing change, xanax at night for sleep like she does at home     [facility] J 03/03/2012, 10:04 AM

## 2012-03-03 NOTE — Progress Notes (Signed)
Physical Therapy Treatment Patient Details Name: Leah Holland MRN: [id number] DOB: Jun 07, 1922 Today's Date: 03/03/2012  PT Assessment/Plan  PT - Assessment/Plan Comments on Treatment Session: Pt admitted s/p left TKA and is progressing.  Pt able to tolerate left LE therapeutic exercises this pm.   PT Plan: Discharge plan remains appropriate;Frequency remains appropriate PT Frequency: 7X/week Follow Up Recommendations: Skilled nursing facility Equipment Recommended: Defer to next venue PT Goals  Acute Rehab PT Goals PT Goal Formulation: With patient Time For Goal Achievement: 7 days PT Goal: Perform Home Exercise Program - Progress: Progressing toward goal  PT Treatment Precautions/Restrictions  Precautions Precautions: Knee Precaution Booklet Issued: No Precaution Comments: discussed knee precautions Required Braces or Orthoses: No Restrictions Weight Bearing Restrictions: Yes LLE Weight Bearing: Weight bearing as tolerated Pain 4/10 in left knee with exercises.  Pt repositioned. Mobility (including Balance) Bed Mobility Bed Mobility: No Transfers Transfers: No Ambulation/Gait Ambulation/Gait: No Stairs: No Wheelchair Mobility Wheelchair Mobility: No  Posture/Postural Control Posture/Postural Control: No significant limitations Balance Balance Assessed: No Exercise  Total Joint Exercises Ankle Circles/Pumps: AROM;Both;Supine;10 reps Quad Sets: AROM;Left;10 reps;Supine Heel Slides: AAROM;Left;Supine;5 reps Hip ABduction/ADduction: AAROM;Left;10 reps;Supine Straight Leg Raises: AAROM;Left;10 reps;Supine End of Session PT - End of Session Activity Tolerance: Patient tolerated treatment well;Patient limited by fatigue;Patient limited by pain Patient left: in bed;in CPM;with call bell in reach (CPM 0-35 degrees.  CNA aware.) General Behavior During Session: [facility] for tasks performed Cognition: [facility] for tasks performed  Cephus Shelling 03/03/2012, 3:03  PM  03/03/2012 Cephus Shelling, PT, DPT [phone]

## 2012-03-04 LAB — CBC
Hemoglobin: 8.9 g/dL — ABNORMAL LOW (ref 12.0–15.0)
MCV: 91.8 fL (ref 78.0–100.0)
Platelets: 155 10*3/uL (ref 150–400)
RBC: 2.94 MIL/uL — ABNORMAL LOW (ref 3.87–5.11)
WBC: 13.5 10*3/uL — ABNORMAL HIGH (ref 4.0–10.5)

## 2012-03-04 MED ORDER — BISACODYL 10 MG RE SUPP
10.0000 mg | Freq: Two times a day (BID) | RECTAL | Status: DC
  Administered 2012-03-04: 10 mg via RECTAL
  Filled 2012-03-04 (×3): qty 1

## 2012-03-04 NOTE — Progress Notes (Signed)
Physical Therapy Treatment Patient Details Name: Leah Holland MRN: [id number] DOB: 08-12-1922 Today's Date: 03/04/2012  PT Assessment/Plan  PT - Assessment/Plan Comments on Treatment Session: Increased gait distance today and less assistance with mobility required today. PT Plan: Discharge plan remains appropriate;Frequency remains appropriate PT Frequency: 7X/week Follow Up Recommendations: Skilled nursing facility Equipment Recommended: Defer to next venue PT Goals  Acute Rehab PT Goals PT Goal: Supine/Side to Sit - Progress: Progressing toward goal PT Goal: Sit to Stand - Progress: Met PT Goal: Ambulate - Progress: Progressing toward goal PT Goal: Perform Home Exercise Program - Progress: Progressing toward goal  PT Treatment Precautions/Restrictions  Precautions Precautions: Knee Precaution Booklet Issued: No Precaution Comments: discussed knee precautions Required Braces or Orthoses: No Restrictions Weight Bearing Restrictions: Yes LLE Weight Bearing: Weight bearing as tolerated Mobility (including Balance) Bed Mobility Supine to Sit: 4: Min assist;HOB flat Supine to Sit Details (indicate cue type and reason): no rails. assist for left LE only with moving to edge of bed and for support of left LE once edge of bed and scooting out. pt able to elevated trunk and scoot self to edge of bed with min cues for technique. Transfers Sit to Stand: 4: Min assist;From bed;With upper extremity assist (min guard assist) Sit to Stand Details (indicate cue type and reason): min cues for hand placement and for left LE placement with standing. cues for ant wt shift to assist with standing. Stand to Sit: 3: Mod assist;To chair/3-in-1;With upper extremity assist Stand to Sit Details: assit to control descent with sitting down. cues to reach back for chair to use arms to assist with transfers and cues to back all the way to the chair before sitting. Ambulation/Gait Ambulation/Gait  Assistance: 4: Min assist Ambulation/Gait Assistance Details (indicate cue type and reason): mod cues for sequency and to not get too close to RW front with gait. cues for posture, to increase left LE wt bearing and stance time and to increase right LE step length. Ambulation Distance (Feet): 40 Feet Assistive device: Rolling walker Gait Pattern: Step-to pattern;Decreased step length - right;Decreased stance time - left;Decreased stride length;Trunk flexed;Antalgic  Posture/Postural Control Posture/Postural Control: No significant limitations Exercise  Total Joint Exercises Ankle Circles/Pumps: AROM;Both;10 reps;Supine Quad Sets: AAROM;AROM;Strengthening;Left;10 reps;Supine Heel Slides: AAROM;Left;10 reps;Supine;Strengthening (AROM supine 10-70* flexion) Hip ABduction/ADduction: AAROM;Strengthening;Left;10 reps;Supine Straight Leg Raises: AAROM;Strengthening;Left;10 reps;Supine End of Session PT - End of Session Equipment Utilized During Treatment: Gait belt Activity Tolerance: Patient tolerated treatment well Patient left: in chair;with call bell in reach Nurse Communication: Mobility status for transfers;Mobility status for ambulation;Weight bearing status General Behavior During Session: [facility] for tasks performed Cognition: [facility] for tasks performed  Sallyanne Kuster 03/04/2012, 12:19 PM  Sallyanne Kuster, PTA Office- [phone] Pager- [phone]

## 2012-03-04 NOTE — Progress Notes (Signed)
Physical Therapy Treatment Patient Details Name: Leah Holland MRN: [id number] DOB: 1922-10-30 Today's Date: 03/04/2012  PT Assessment/Plan  PT - Assessment/Plan Comments on Treatment Session: Pt with reports of feeling tired this pm and with increased knee pain, therfore limited ambulation performed (from bsc to bed) only.  PT Plan: Discharge plan remains appropriate;Frequency remains appropriate PT Frequency: 7X/week Follow Up Recommendations: Skilled nursing facility Equipment Recommended: Defer to next venue PT Goals  Acute Rehab PT Goals PT Goal: Supine/Side to Sit - Progress: Progressing toward goal PT Goal: Sit to Supine/Side - Progress: Progressing toward goal PT Goal: Sit to Stand - Progress: Progressing toward goal PT Goal: Ambulate - Progress: Progressing toward goal PT Goal: Perform Home Exercise Program - Progress: Progressing toward goal  PT Treatment Precautions/Restrictions  Precautions Precautions: Knee Precaution Booklet Issued: No Precaution Comments: discussed knee precautions Required Braces or Orthoses: No Restrictions Weight Bearing Restrictions: Yes LLE Weight Bearing: Weight bearing as tolerated Mobility (including Balance) Bed Mobility Sit to Supine: 4: Min assist;HOB flat Sit to Supine - Details (indicate cue type and reason): assist to clear bed surface with left LE only, pt able to lower trunk down and raise right LE onto bed without assitance. Scooting to [facility]: 5: Supervision Scooting to [facility] Details (indicate cue type and reason): pt able to use arms and right LE to scoot self up in bed. Transfers Sit to Stand: 4: Min assist;From chair/3-in-1;With upper extremity assist;With armrests (min guard assist) Sit to Stand Details (indicate cue type and reason): VC x1 for left LE placement with standing Stand to Sit: 4: Min assist;To bed;With upper extremity assist (min guard assist) Stand to Sit Details: VC x1 to reach back for bed surface to control  descent with sitting down. Ambulation/Gait Ambulation/Gait Assistance: 4: Min assist Ambulation/Gait Assistance Details (indicate cue type and reason): cues for posture and sequency with gait. Ambulation Distance (Feet): 6 Feet Assistive device: Rolling walker Gait Pattern: Step-to pattern;Decreased stance time - left;Decreased step length - right;Antalgic  Posture/Postural Control Posture/Postural Control: No significant limitations Exercise  Total Joint Exercises Ankle Circles/Pumps: AROM;Both;10 reps;Supine Quad Sets: AROM;Strengthening;Left;10 reps;Supine Heel Slides: AAROM;Strengthening;Left;10 reps;Supine Hip ABduction/ADduction: AAROM;Strengthening;Left;10 reps;Supine Straight Leg Raises: AAROM;Strengthening;Left;10 reps;Supine End of Session PT - End of Session Equipment Utilized During Treatment: Gait belt Activity Tolerance: Patient tolerated treatment well;Patient limited by fatigue Patient left: in bed;with call bell in reach Nurse Communication: Mobility status for transfers;Mobility status for ambulation General Behavior During Session: [facility] for tasks performed Cognition: [facility] for tasks performed  Sallyanne Kuster 03/04/2012, 2:46 PM  Sallyanne Kuster, PTA Office- [phone] Pager- [phone]

## 2012-03-04 NOTE — Progress Notes (Signed)
[name], female   DOB: 02-Oct-1922, 78 y.o.   MRN: [id number] [name]  MRN: [id number]  DOB/AGE:  11-28-1922 / 78 y.o.  2 Days Post-Op Procedure(s) (LRB): TOTAL KNEE ARTHROPLASTY (Left)    PROGRESS NOTE Subjective: Patient is alert, oriented, no Nausea, no Vomiting, yes} passing gas, no Bowel Movement. Taking PO yes. Denies SOB, Chest or Calf Pain. Using Incentive Spirometer, PAS in place. Ambulate well, CPM 0-70 Patient reports pain as 4 on 0-10 scale  .    Objective: Vital signs in last 24 hours: Filed Vitals:   03/03/12 0558 03/03/12 1353 03/03/12 2155 03/04/12 0540  BP: 161/70 135/67 133/60 132/57  Pulse: 89 88 96 97  Temp: 98.1 F (36.7 C) 97.6 F (36.4 C) 98.9 F (37.2 C) 99.5 F (37.5 C)  TempSrc:  Oral    Resp: 18 18 16 16   SpO2: 93% 95% 92% 91%      Intake/Output from previous day: I/O last 3 completed shifts: In: 1200 [P.O.:600; I.V.:600] Out: 1450 [Urine:1400; Drains:50]   Intake/Output this shift:     LABORATORY DATA:  Basename 03/04/12 0520 03/03/12 0500  WBC 13.5* 9.4  HGB 8.9* 9.4*  HCT 27.0* 28.8*  PLT 155 162  NA -- 135  K -- 4.0  CL -- 100  CO2 -- 30  BUN -- 12  CREATININE -- 0.95  GLUCOSE -- 140*  GLUCAP -- --  INR -- --  CALCIUM -- 8.4    Examination: Neurologically intact ABD soft Sensation intact distally Intact pulses distally Dorsiflexion/Plantar flexion intact Incision: dressing C/D/I and scant drainage}  Assessment:   2 Days Post-Op Procedure(s) (LRB): TOTAL KNEE ARTHROPLASTY (Left) ADDITIONAL DIAGNOSIS:  Acute Blood Loss Anemia, Hypertension and GERD, insomnia, constipation  Plan: PT/OT WBAT, CPM 5/hrs day until ROM 0-90 degrees, then D/C CPM DVT Prophylaxis:  SCDx72hr and Lovenox DISCHARGE PLAN: Skilled Nursing Facility/Rehab DISCHARGE NEEDS: dulcolax suppository today, encourage incentive spirometry.  Dr Mahala Menghini to see her tomorrow.     Kaylene Dawn J 03/04/2012,  9:48 AM

## 2012-03-04 NOTE — Progress Notes (Signed)
Occupational Therapy Evaluation Patient Details Name: Leah Holland MRN: [id number] DOB: 21-Sep-1922 Today's Date: 03/04/2012  Problem List:  Patient Active Problem List  Diagnoses  . PONV (postoperative nausea and vomiting)  . Hypertension  . Hyperlipidemia  . Swelling of extremity  . Cataract  . Hypothyroidism  . GERD (gastroesophageal reflux disease)  . Arthritis  . Insomnia  . Postoperative anemia due to acute blood loss    Past Medical History:  Past Medical History  Diagnosis Date  . Complication of anesthesia   . PONV (postoperative nausea and vomiting)   . Hypertension   . Hyperlipidemia   . Swelling of extremity     sees Dr. Allyson Sabal, cardiologist 1 x year [phone]  . Cataract     bilaterally  . Bronchitis     Jan 2013  . Hypothyroidism     takes synthroid  . GERD (gastroesophageal reflux disease)   . Neuromuscular disorder     carpal tunnel in right hand  . Cancer     on face, removed  . Arthritis   . Depression     2005, was on medication at that time  . Insomnia    Past Surgical History:  Past Surgical History  Procedure Date  . Knee arthroplasty     right knee in 2005  . Carpal tunnel release     right side  . Eye surgery     bilateral cataract surgery 2000  . Breast biopsy     left  . Cardiovascular stress test     done 12/07/11  . Doppler echocardiography     12/07/11    OT Assessment/Plan/Recommendation OT Assessment Clinical Impression Statement: Pt s/p L TKA thus affecting PLOF.  Will benefit from acute OT services to addrses below problem list in prep for d/c to SNF (pt requesting [facility]). OT Recommendation/Assessment: Patient will need skilled OT in the acute care venue OT Problem List: Decreased activity tolerance;Pain;Decreased knowledge of use of DME or AE Barriers to Discharge: Decreased caregiver support OT Therapy Diagnosis : Acute pain OT Plan OT Frequency: Min 2X/week OT Treatment/Interventions: Self-care/ADL  training;DME and/or AE instruction;Therapeutic activities;Patient/family education OT Recommendation Follow Up Recommendations: Skilled nursing facility Equipment Recommended: Defer to next venue Individuals Consulted Consulted and Agree with Results and Recommendations: Patient OT Goals Acute Rehab OT Goals OT Goal Formulation: With patient Time For Goal Achievement: 7 days ADL Goals Pt Will Perform Grooming: with supervision;Standing at sink ADL Goal: Grooming - Progress: Goal set today Pt Will Perform Lower Body Bathing: with supervision;Sit to stand from chair;Sit to stand from bed ADL Goal: Lower Body Bathing - Progress: Goal set today Pt Will Transfer to Toilet: with supervision;with DME;3-in-1;Ambulation ADL Goal: Toilet Transfer - Progress: Goal set today Pt Will Perform Toileting - Clothing Manipulation: with supervision;Standing ADL Goal: Toileting - Clothing Manipulation - Progress: Goal set today  OT Evaluation Precautions/Restrictions  Precautions Precautions: Knee Precaution Booklet Issued: No Precaution Comments: discussed knee precautions Required Braces or Orthoses: No Restrictions Weight Bearing Restrictions: Yes LLE Weight Bearing: Weight bearing as tolerated Prior Functioning Home Living Lives With: Alone;Other (Comment) (Pt planning to go to SNF for rehab before home) Receives Help From: Family Type of Home: House Home Layout: One level Bathroom Shower/Tub: Engineer, manufacturing systems: Standard Prior Function Level of Independence: Independent with basic ADLs;Independent with homemaking with ambulation;Independent with gait;Independent with transfers Able to Take Stairs?: Yes Driving: Yes ADL ADL Lower Body Dressing: Performed;Minimal assistance Lower Body Dressing Details (indicate cue type  and reason): Pt required min assist to don bil. shoes.  Pt reports that she typically uses her shoe horn. Where Assessed - Lower Body Dressing: Sitting,  bed Toilet Transfer: Simulated;Moderate assistance Toilet Transfer Details (indicate cue type and reason): assist for anterior translation from sit to stand. cueing for sequencing and hand placement. Toilet Transfer Method: Surveyor, minerals: Other (comment) (bed) Equipment Used: Rolling walker Vision/Perception    Cognition Cognition Arousal/Alertness: Awake/alert Overall Cognitive Status: Appears within functional limits for tasks assessed Orientation Level: Oriented X4 Sensation/Coordination   Extremity Assessment RUE Assessment RUE Assessment: Within Functional Limits LUE Assessment LUE Assessment: Within Functional Limits Mobility  Bed Mobility Bed Mobility: Yes Supine to Sit: 4: Min assist;HOB flat Supine to Sit Details (indicate cue type and reason): assist for LLE OOB. Sitting - Scoot to Edge of Bed: 5: Supervision Sit to Supine: 4: Min assist;HOB flat Sit to Supine - Details (indicate cue type and reason): assist to LLE into bed.   Scooting to [facility]: 5: Supervision Scooting to [facility] Details (indicate cue type and reason): pt able to use arms and right LE to scoot self up in bed. Transfers Sit to Stand: From bed;With upper extremity assist;3: Mod assist Sit to Stand Details (indicate cue type and reason): cueing for sequencing and hand placement.  assist for anterior translation. Stand to Sit: 4: Min assist;To bed;With upper extremity assist Stand to Sit Details: cueing for safe hand placement and to control descent Exercises  End of Session OT - End of Session Equipment Utilized During Treatment: Gait belt Activity Tolerance: Patient limited by fatigue;Patient limited by pain Patient left: in bed;with call bell in reach General Behavior During Session: [facility] for tasks performed Cognition: [facility] for tasks performed  3:25 PM  03/04/2012 Cipriano Mile OTR/L Pager [phone] Office [phone]

## 2012-03-05 LAB — CBC
HCT: 23.1 % — ABNORMAL LOW (ref 36.0–46.0)
MCHC: 32.9 g/dL (ref 30.0–36.0)
Platelets: 145 10*3/uL — ABNORMAL LOW (ref 150–400)
Platelets: 152 10*3/uL (ref 150–400)
RBC: 3.09 MIL/uL — ABNORMAL LOW (ref 3.87–5.11)
RDW: 14.4 % (ref 11.5–15.5)
RDW: 14.7 % (ref 11.5–15.5)
WBC: 12.3 10*3/uL — ABNORMAL HIGH (ref 4.0–10.5)
WBC: 11.4 10*3/uL — ABNORMAL HIGH (ref 4.0–10.5)

## 2012-03-05 MED ORDER — OXYCODONE HCL 5 MG PO TABS
ORAL_TABLET | ORAL | Status: DC
Start: 2012-03-05 — End: 2013-04-26

## 2012-03-05 MED ORDER — ENOXAPARIN SODIUM 30 MG/0.3ML ~~LOC~~ SOLN: 30.0000 mg | Freq: Two times a day (BID) | SUBCUTANEOUS | Status: DC

## 2012-03-05 MED ORDER — DSS 100 MG PO CAPS: 100.0000 mg | ORAL_CAPSULE | Freq: Two times a day (BID) | ORAL | Status: AC

## 2012-03-05 NOTE — Discharge Summary (Signed)
PATIENT ID:      Leah Holland  MRN:     [id number] DOB/AGE:    09/02/1922 / 78 y.o.     DISCHARGE SUMMARY  ADMISSION DATE:    03/02/2012 DISCHARGE DATE:   03/05/2012   ADMISSION DIAGNOSIS: osteoarthritis left knee  (osteoarthritis left knee)  DISCHARGE DIAGNOSIS:  osteoarthritis left knee    SECONDARY DIAGNOSIS: acute blood loss anemia  ADDITIONAL DIAGNOSIS: Principal Problem:  *Arthritis Active Problems:  Hypertension  GERD (gastroesophageal reflux disease)  Insomnia  Postoperative anemia due to acute blood loss  Past Medical History  Diagnosis Date  . Complication of anesthesia   . PONV (postoperative nausea and vomiting)   . Hypertension   . Hyperlipidemia   . Swelling of extremity     sees Dr. Allyson Sabal, cardiologist 1 x year [phone]  . Cataract     bilaterally  . Bronchitis     Jan 2013  . Hypothyroidism     takes synthroid  . GERD (gastroesophageal reflux disease)   . Neuromuscular disorder     carpal tunnel in right hand  . Cancer     on face, removed  . Arthritis   . Depression     2005, was on medication at that time  . Insomnia     PROCEDURE: Procedure(s): TOTAL KNEE ARTHROPLASTY on 03/02/2012  CONSULTS:   None  HISTORY:  See H&P in chart  HOSPITAL COURSE:  Leah Holland is a 78 y.o. admitted on 03/02/2012 and found to have a diagnosis of osteoarthritis left knee.  After appropriate laboratory studies were obtained  they were taken to the operating room on 03/02/2012 and underwent Procedure(s): TOTAL KNEE ARTHROPLASTY.   They were given perioperative antibiotics:  Anti-infectives     Start     Dose/Rate Route Frequency Ordered Stop   03/02/12 1400   ceFAZolin (ANCEF) IVPB 1 g/50 mL premix        1 g 100 mL/hr over 30 Minutes Intravenous Every 6 hours 03/02/12 1317 03/03/12 0309   03/01/12 1416   ceFAZolin (ANCEF) IVPB 1 g/50 mL premix        1 g 100 mL/hr over 30 Minutes Intravenous 60 min pre-op 03/01/12 1416 03/02/12 0740         .  Tolerated the procedure well.  Placed with a foley intraoperatively.  Given Ofirmev at induction and for 48 hours.  PCA for analgesia.   POD #1, allowed out of bed to a chair.  PT for ambulation and exercise program.  Foley D/C'd in morning.  IV saline locked.  O2 discontionued.  POD #2, continued PT and ambulation  POD#3, HGB at 7.6 decided to transfuse 2 units PRBCs  POD#4, HGB improved s/p transfusion . The remainder of the hospital course was dedicated to ambulation and strengthening.   The patient was discharged on 4 days post op in  Stable condition.  Blood products given:2 units PRBCs  DIAGNOSTIC STUDIES: Recent vital signs: Patient Vitals for the past 24 hrs:  BP Temp Temp src Pulse Resp SpO2  03/05/12 0629 134/70 mmHg 99.6 F (37.6 C) - 100  18  96 %  03/05/12 0020 - 99.1 F (37.3 C) Oral - - -  03/04/12 2134 111/54 mmHg 101.2 F (38.4 C) Oral 96  18  93 %  03/04/12 2115 135/55 mmHg - - 88  - -  03/04/12 1356 113/56 mmHg 99 F (37.2 C) Oral 87  18  -  Recent laboratory studies:  Basename 03/05/12 0615 2012/03/31 0520 03/03/12 0500  WBC 12.3* 13.5* 9.4  HGB 7.6* 8.9* 9.4*  HCT 23.1* 27.0* 28.8*  PLT 145* 155 162    Basename 03/03/12 0500  NA 135  K 4.0  CL 100  CO2 30  BUN 12  CREATININE 0.95  GLUCOSE 140*  CALCIUM 8.4   Lab Results  Component Value Date   INR 0.92 02/23/2012     Recent Radiographic Studies :  Dg Chest 2 View  02/23/2012  *RADIOLOGY REPORT*  Clinical Data: Preoperative evaluation for left total knee arthroplasty.  Hypertension and gastroesophageal reflux disease. No current chest complaints  CHEST - 2 VIEW  Comparison: 12/21/2011  Findings: Changes of underlying COPD are identified with a stable degree of hyperinflation noted.  Taking this into consideration, heart size is within normal limits.  A normal mediastinal configuration is seen with the exception of calcification in the thoracic and upper abdominal aorta.  The lung  fields appear clear with no signs of focal infiltrate or congestive failure.  No pleural fluid or significant peribronchial cuffing is seen.  Bony structures demonstrate degenerative osteophytosis of the lower cervical spine and mid and lower thoracic spine.  IMPRESSION: Stable emphysematous changes with no new worrisome focal or acute cardiopulmonary abnormality suggested.  Original Report Authenticated By: Bertha Stakes, M.D.   X-ray Knee Left Port  03/02/2012  *RADIOLOGY REPORT*  Clinical Data: Status post knee replacement.  PORTABLE LEFT KNEE - 1-2 VIEW  Comparison: None.  Findings: The patient has a new left total knee replacement.  The device is located and there is no fracture.  Surgical drain and staples are noted.  IMPRESSION: Left total knee replacement without evidence of complication.  Original Report Authenticated By: Bernadene Bell. D'ALESSIO, M.D.    DISCHARGE INSTRUCTIONS:   DISCHARGE MEDICATIONS:   Medication List  As of 03/05/2012  1:12 PM   STOP taking these medications         aspirin EC 81 MG tablet      HYDROcodone-acetaminophen 5-325 MG per tablet         TAKE these medications         ALPRAZolam 0.25 MG tablet   Commonly known as: XANAX   Take 0.125 mg by mouth at bedtime as needed. For sleep.      amLODipine 10 MG tablet   Commonly known as: NORVASC   Take 10 mg by mouth daily.      atorvastatin 40 MG tablet   Commonly known as: LIPITOR   Take 40 mg by mouth daily.      benazepril 40 MG tablet   Commonly known as: LOTENSIN   Take 40 mg by mouth daily.      DSS 100 MG Caps   Take 100 mg by mouth 2 (two) times daily.      enoxaparin 30 MG/0.3ML injection   Commonly known as: LOVENOX   Inject 0.3 mLs (30 mg total) into the skin every 12 (twelve) hours.      FLAXSEED OIL PO   Take 1 tablet by mouth 2 (two) times daily.      furosemide 40 MG tablet   Commonly known as: LASIX   Take 60 mg by mouth daily.      levothyroxine 100 MCG tablet   Commonly  known as: SYNTHROID, LEVOTHROID   Take 100 mcg by mouth daily.      MACULAR VITAMIN BENEFIT PO   Take 1 tablet by mouth  2 (two) times daily.      metoprolol 100 MG tablet   Commonly known as: LOPRESSOR   Take 50 mg by mouth 2 (two) times daily.      omeprazole 10 MG capsule   Commonly known as: PRILOSEC   Take 10 mg by mouth daily.      oxyCODONE 5 MG immediate release tablet   Commonly known as: Oxy IR/ROXICODONE   Take 1-2 tabs po q4-6hrs prn pain      potassium chloride SA 20 MEQ tablet   Commonly known as: K-DUR,KLOR-CON   Take 20 mEq by mouth 2 (two) times daily.            FOLLOW UP VISIT:  already set up for 2 weeks post op with Dr. Madelon Lips  DISPOSITION:  Skilled nursing facility    CONDITION:  Stable   Spyridon Hornstein, Ivin Booty 03/05/2012, 1:12 PM

## 2012-03-05 NOTE — Progress Notes (Signed)
Subjective: 3 Days Post-Op Procedure(s) (LRB): TOTAL KNEE ARTHROPLASTY (Left) Patient reports pain as mild.  Denies CP, SOB, dizziness/light headedness, fatigue, confusion. HGB at 7.6 today down from 8.9 yesterday.  Objective: Vital signs in last 24 hours: Temp:  [99 F (37.2 C)-101.2 F (38.4 C)] 99.6 F (37.6 C) (03/25 0629) Pulse Rate:  [87-100] 100  (03/25 0629) Resp:  [18] 18  (03/25 0629) BP: (111-135)/(54-70) 134/70 mmHg (03/25 0629) SpO2:  [93 %-96 %] 96 % (03/25 0629)  Intake/Output from previous day: 03/24 0701 - 03/25 0700 In: 1080 [P.O.:1080] Out: -  Intake/Output this shift:     Basename 03/05/12 0615 03/04/12 0520 03/03/12 0500  HGB 7.6* 8.9* 9.4*    Basename 03/05/12 0615 03/04/12 0520  WBC 12.3* 13.5*  RBC 2.54* 2.94*  HCT 23.1* 27.0*  PLT 145* 155    Basename 03/03/12 0500  NA 135  K 4.0  CL 100  CO2 30  BUN 12  CREATININE 0.95  GLUCOSE 140*  CALCIUM 8.4   No results found for this basename: LABPT:2,INR:2 in the last 72 hours  Neurovascular intact Sensation intact distally Intact pulses distally Dorsiflexion/Plantar flexion intact Incision: dressing C/D/I No cellulitis present  Assessment/Plan: 3 Days Post-Op Procedure(s) (LRB): TOTAL KNEE ARTHROPLASTY (Left) Discharge to SNF Transfuse 2 units RBCs for acute blood loss anemia recheck labs may still d/c today if doing well s/p transfusion.  Plan discussed with Dr. Madelon Lips and patient.   Margart Sickles 03/05/2012, 1:00 PM

## 2012-03-05 NOTE — Progress Notes (Signed)
CSW talked with patient's nurse Sharyl Nimrod regarding a blood transfusion that had been ordered for patient before her discharge to [facility].  CSW advised that transfusion began 3:20 pm and it will take 5 hrs for the transfusion, then the patient will need lab work. Donne Hazel, admissions director with [facility] contacted and situation discussed and it was decided that due to the lateness of the hr when transfusion and lab work will be completed, patient will discharge Tuesday morning.  RN will notify MD and CSW contacted patient's son by phone and talked with him. Ms. Blanda will discharge to [facility] on Tuesday morning and be transported by ambulance.  Genelle Bal, MSW, LCSW [phone]

## 2012-03-05 NOTE — Progress Notes (Signed)
OT Cancellation Note  Treatment cancelled today due to patient's refusal to participate. Pt planning to D/C to facility  [facility], OTR/L  [phone] 03/05/2012 03/05/2012, 2:54 PM

## 2012-03-05 NOTE — Progress Notes (Signed)
Physical Therapy Treatment Patient Details Name: HIRA TRENT MRN: [id number] DOB: 04-23-1922 Today's Date: 03/05/2012  PT Assessment/Plan  PT - Assessment/Plan Comments on Treatment Session: Pt very willing to work despite pain; she is hopeful for going to rehab soon PT Plan: Discharge plan remains appropriate;Frequency remains appropriate PT Frequency: 7X/week Follow Up Recommendations: Skilled nursing facility Equipment Recommended: Defer to next venue PT Goals  Acute Rehab PT Goals Time For Goal Achievement: 7 days Pt will go Supine/Side to Sit: with supervision PT Goal: Supine/Side to Sit - Progress: Progressing toward goal Pt will go Sit to Supine/Side: with supervision PT Goal: Sit to Supine/Side - Progress: Progressing toward goal Pt will go Sit to Stand: with min assist PT Goal: Sit to Stand - Progress: Met Pt will Ambulate: 51 - 150 feet;with min assist;with rolling walker PT Goal: Ambulate - Progress: Partly met Pt will Perform Home Exercise Program: with supervision, verbal cues required/provided PT Goal: Perform Home Exercise Program - Progress: Progressing toward goal  PT Treatment Precautions/Restrictions  Precautions Precautions: Knee Precaution Booklet Issued: No Precaution Comments: discussed knee precautions Required Braces or Orthoses:  (KI was in room, and we used it to boost pt') Restrictions Weight Bearing Restrictions: Yes LLE Weight Bearing: Weight bearing as tolerated Mobility (including Balance) Bed Mobility Supine to Sit: 4: Min assist (guard assist) Supine to Sit Details (indicate cue type and reason): cues for safe technique Sitting - Scoot to Edge of Bed: 5: Supervision Sitting - Scoot to [place] of Bed Details (indicate cue type and reason): smooth transition Sit to Supine: 4: Min assist;HOB flat (guard assist) Sit to Supine - Details (indicate cue type and reason): did not require physical assist t get LLE in bed (KI was on) Transfers Sit  to Stand: 4: Min assist;From bed;With upper extremity assist (guared assist) Sit to Stand Details (indicate cue type and reason): cues for safe technique Stand to Sit: 4: Min assist;To bed (guard assist) Stand to Sit Details: cues for control, technique, hand placement Ambulation/Gait Ambulation/Gait: Yes Ambulation/Gait Assistance: 4: Min assist (Guard assist) Ambulation/Gait Assistance Details (indicate cue type and reason): cues for posture and sequence Ambulation Distance (Feet): 25 Feet Assistive device: Rolling walker Gait Pattern: Step-to pattern    Exercise  Total Joint Exercises Quad Sets: AROM;Strengthening;Left;10 reps;Supine Short Arc Quad: AAROM;Strengthening;Left;10 reps;Supine Heel Slides: AAROM;Strengthening;Left;10 reps;Supine Straight Leg Raises: AAROM;Strengthening;Left;10 reps;Supine End of Session PT - End of Session Equipment Utilized During Treatment: Gait belt Activity Tolerance: Patient tolerated treatment well Patient left: in bed;with call bell in reach Nurse Communication: Mobility status for transfers;Mobility status for ambulation General Behavior During Session: [facility] for tasks performed Cognition: [facility] for tasks performed  Van Clines [facility] [place], [place] [phone]  03/05/2012, 12:44 PM

## 2012-03-05 NOTE — Progress Notes (Signed)
UR COMPLETED  

## 2012-03-06 ENCOUNTER — Encounter (HOSPITAL_COMMUNITY): Payer: Self-pay | Admitting: Orthopedic Surgery

## 2012-03-06 LAB — TYPE AND SCREEN
ABO/RH(D): O NEG
Antibody Screen: NEGATIVE
Unit division: 0

## 2012-03-06 LAB — BASIC METABOLIC PANEL
Calcium: 8.4 mg/dL (ref 8.4–10.5)
Chloride: 99 mEq/L (ref 96–112)
Creatinine, Ser: 1 mg/dL (ref 0.50–1.10)
GFR calc Af Amer: 57 mL/min — ABNORMAL LOW (ref >90)
Sodium: 135 mEq/L (ref 135–145)

## 2012-03-06 LAB — CBC
MCH: 29.5 pg (ref 26.0–34.0)
MCV: 88.6 fL (ref 78.0–100.0)
Platelets: 138 10*3/uL — ABNORMAL LOW (ref 150–400)
RDW: 15 % (ref 11.5–15.5)
WBC: 9.7 10*3/uL (ref 4.0–10.5)

## 2012-03-06 NOTE — Progress Notes (Signed)
Physical Therapy Treatment Note   03/06/12 1126  PT Visit Information  Last PT Received On 03/06/12  Precautions  Precautions Knee  Restrictions  LLE Weight Bearing WBAT  Bed Mobility  Bed Mobility (pt received sitting on BSC)  Transfers  Sit to Stand 4: Min assist  Sit to Stand Details (indicate cue type and reason) v/cs for hand placement and safety with RW  Stand to Sit 4: Min assist  Stand to Sit Details cues to reach back for chair to control descent  Ambulation/Gait  Ambulation/Gait Assistance 4: Min assist  Ambulation/Gait Assistance Details (indicate cue type and reason) increased R knee flexion but no episodes of buckling  Ambulation Distance (Feet) 50 Feet  Assistive device Rolling walker  Gait Pattern Step-to pattern;Decreased step length - left;Decreased stance time - left;Antalgic  Gait velocity wfl for surgery  Stairs No  PT - End of Session  Equipment Utilized During Treatment Gait belt  Activity Tolerance Patient tolerated treatment well  Patient left in chair;with call bell in reach (EMT arrived to transport patient to [facility])  General  Behavior During Session [facility] for tasks performed  Cognition [facility] for tasks performed  PT - Assessment/Plan  Comments on Treatment Session Patient with significant increase in ambulation tolerance this date however remains unable to achieve full L knee extension and amb with increased L knee flexion. Patient leaving for [facility] today.  PT Plan Discharge plan remains appropriate  PT Frequency 7X/week  Follow Up Recommendations Skilled nursing facility  Acute Rehab PT Goals  PT Goal: Sit to Supine/Side - Progress Progressing toward goal  PT Goal: Sit to Stand - Progress Met  PT Goal: Ambulate - Progress Progressing toward goal  PT Goal: Perform Home Exercise Program - Progress Progressing toward goal    Pain: 4/10 L knee pain  Lewis Shock, PT, DPT Pager #: [phone] Office #: [phone]

## 2012-03-06 NOTE — Discharge Instructions (Signed)
Diet: As you were doing prior to hospitalization  Activity:  Increase activity slowly as tolerated                  No lifting or driving for 6 weeks  Shower:  May shower without a dressing once there is no drainage from your wound.                   Dressing:  You may change your dressing on Wednesday                    Then change the dressing daily with sterile 4"x4"s gauze dressing                     And TED hose for knees.    Weight Bearing:   weight bearing as taught in physical therapy.  Use a walker or                    Crutches as instructed.  To prevent constipation: you may use a stool softener such as -               Colace ( over the counter) 100 mg by mouth twice a day                Drink plenty of fluids ( prune juice may be helpful) and high fiber foods                Miralax ( over the counter) for constipation as needed.    Precautions:  If you experience chest pain or shortness of breath - call 911 immediately               For transfer to the hospital emergency department!!               If you develop a fever greater that 101 F, purulent drainage from wound,                             increased redness or drainage from wound, or calf pain -- Call the office at                                                 [phone].  Follow- Up Appointment:  Please call for an appointment to be seen as scheduled 2 weeks post op               [facility] - [phone]               [facility] - [phone]               [place] - [phone]

## 2012-03-06 NOTE — Progress Notes (Signed)
Subjective: 4 Days Post-Op Procedure(s) (LRB): TOTAL KNEE ARTHROPLASTY (Left) Patient reports pain as mild.  Improved HGB post transfusion.  Objective: Vital signs in last 24 hours: Temp:  [98.2 F (36.8 C)-100.1 F (37.8 C)] 98.8 F (37.1 C) (03/26 0537) Pulse Rate:  [83-98] 98  (03/26 0537) Resp:  [17-18] 18  (03/26 0537) BP: (109-159)/(60-72) 144/64 mmHg (03/26 0537) SpO2:  [94 %] 94 % (03/26 0537)  Intake/Output from previous day: 03/25 0701 - 03/26 0700 In: 730 [P.O.:480; I.V.:250] Out: -  Intake/Output this shift:     Basename 03/06/12 0515 03/05/12 2134 03/05/12 0615 03/04/12 0520  HGB 9.3* 9.4* 7.6* 8.9*    Basename 03/06/12 0515 03/05/12 2134  WBC 9.7 11.4*  RBC 3.15* 3.09*  HCT 27.9* 27.4*  PLT 138* 152    Basename 03/06/12 0515  NA 135  K 4.0  CL 99  CO2 30  BUN 16  CREATININE 1.00  GLUCOSE 115*  CALCIUM 8.4   No results found for this basename: LABPT:2,INR:2 in the last 72 hours  Incision: dressing C/D/I  Assessment/Plan: 4 Days Post-Op Procedure(s) (LRB): TOTAL KNEE ARTHROPLASTY (Left) Discharge to SNF  Leah Holland 03/06/2012, 7:11 AM

## 2013-04-26 ENCOUNTER — Observation Stay (HOSPITAL_COMMUNITY)
Admission: EM | Admit: 2013-04-26 | Discharge: 2013-04-27 | Disposition: A | Discharge status: Home / Self Care | Payer: Medicare Other | Attending: Internal Medicine | Admitting: Internal Medicine

## 2013-04-26 ENCOUNTER — Encounter (HOSPITAL_COMMUNITY): Payer: Self-pay | Admitting: Neurology

## 2013-04-26 ENCOUNTER — Emergency Department (HOSPITAL_COMMUNITY): Payer: Medicare Other

## 2013-04-26 DIAGNOSIS — J449 Chronic obstructive pulmonary disease, unspecified: Secondary | ICD-10-CM | POA: Insufficient documentation

## 2013-04-26 DIAGNOSIS — R0789 Other chest pain: Principal | ICD-10-CM | POA: Insufficient documentation

## 2013-04-26 DIAGNOSIS — K219 Gastro-esophageal reflux disease without esophagitis: Secondary | ICD-10-CM | POA: Diagnosis present

## 2013-04-26 DIAGNOSIS — E039 Hypothyroidism, unspecified: Secondary | ICD-10-CM | POA: Diagnosis present

## 2013-04-26 DIAGNOSIS — J4489 Other specified chronic obstructive pulmonary disease: Secondary | ICD-10-CM | POA: Insufficient documentation

## 2013-04-26 DIAGNOSIS — E785 Hyperlipidemia, unspecified: Secondary | ICD-10-CM | POA: Diagnosis present

## 2013-04-26 DIAGNOSIS — I10 Essential (primary) hypertension: Secondary | ICD-10-CM | POA: Diagnosis present

## 2013-04-26 DIAGNOSIS — M7989 Other specified soft tissue disorders: Secondary | ICD-10-CM | POA: Diagnosis present

## 2013-04-26 DIAGNOSIS — I4891 Unspecified atrial fibrillation: Secondary | ICD-10-CM

## 2013-04-26 DIAGNOSIS — R079 Chest pain, unspecified: Secondary | ICD-10-CM

## 2013-04-26 DIAGNOSIS — G4700 Insomnia, unspecified: Secondary | ICD-10-CM | POA: Diagnosis present

## 2013-04-26 DIAGNOSIS — R0602 Shortness of breath: Secondary | ICD-10-CM | POA: Insufficient documentation

## 2013-04-26 LAB — TROPONIN I: Troponin I: <0.3 ng/mL (ref <0.30)

## 2013-04-26 LAB — HEPATIC FUNCTION PANEL
Alkaline Phosphatase: 113 U/L (ref 39–117)
Bilirubin, Direct: 0.2 mg/dL (ref 0.0–0.3)
Indirect Bilirubin: 0.7 mg/dL (ref 0.3–0.9)
Total Bilirubin: 0.9 mg/dL (ref 0.3–1.2)
Total Protein: 6.5 g/dL (ref 6.0–8.3)

## 2013-04-26 LAB — CBC
Hemoglobin: 13.7 g/dL (ref 12.0–15.0)
RBC: 4.5 MIL/uL (ref 3.87–5.11)
WBC: 7.9 10*3/uL (ref 4.0–10.5)

## 2013-04-26 LAB — BASIC METABOLIC PANEL
CO2: 29 mEq/L (ref 19–32)
Chloride: 106 mEq/L (ref 96–112)
GFR calc non Af Amer: 53 mL/min — ABNORMAL LOW (ref >90)
Glucose, Bld: 114 mg/dL — ABNORMAL HIGH (ref 70–99)
Potassium: 4 mEq/L (ref 3.5–5.1)
Sodium: 143 mEq/L (ref 135–145)

## 2013-04-26 LAB — LIPID PANEL
LDL Cholesterol: 60 mg/dL (ref 0–99)
VLDL: 11 mg/dL (ref 0–40)

## 2013-04-26 MED ORDER — BENAZEPRIL HCL 40 MG PO TABS
40.0000 mg | ORAL_TABLET | Freq: Every day | ORAL | Status: DC
  Administered 2013-04-26 – 2013-04-27 (×2): 40 mg via ORAL
  Filled 2013-04-26 (×2): qty 1

## 2013-04-26 MED ORDER — ATORVASTATIN CALCIUM 80 MG PO TABS
80.0000 mg | ORAL_TABLET | Freq: Every day | ORAL | Status: DC
  Filled 2013-04-26: qty 1

## 2013-04-26 MED ORDER — AMLODIPINE BESYLATE 10 MG PO TABS
10.0000 mg | ORAL_TABLET | Freq: Every day | ORAL | Status: DC
  Administered 2013-04-26 – 2013-04-27 (×2): 10 mg via ORAL
  Filled 2013-04-26 (×2): qty 1

## 2013-04-26 MED ORDER — ASPIRIN EC 81 MG PO TBEC
81.0000 mg | DELAYED_RELEASE_TABLET | Freq: Every day | ORAL | Status: DC
  Administered 2013-04-27: 81 mg via ORAL
  Filled 2013-04-26 (×2): qty 1

## 2013-04-26 MED ORDER — SODIUM CHLORIDE 0.9 % IJ SOLN
3.0000 mL | Freq: Two times a day (BID) | INTRAMUSCULAR | Status: DC
  Administered 2013-04-26: 3 mL via INTRAVENOUS

## 2013-04-26 MED ORDER — ASPIRIN 81 MG PO CHEW
324.0000 mg | CHEWABLE_TABLET | Freq: Once | ORAL | Status: AC
  Administered 2013-04-26: 324 mg via ORAL
  Filled 2013-04-26: qty 4

## 2013-04-26 MED ORDER — SODIUM CHLORIDE 0.9 % IJ SOLN
3.0000 mL | Freq: Two times a day (BID) | INTRAMUSCULAR | Status: DC
  Administered 2013-04-27: 3 mL via INTRAVENOUS

## 2013-04-26 MED ORDER — ATORVASTATIN CALCIUM 80 MG PO TABS
80.0000 mg | ORAL_TABLET | Freq: Every day | ORAL | Status: DC
  Administered 2013-04-27: 80 mg via ORAL
  Filled 2013-04-26: qty 1

## 2013-04-26 MED ORDER — ENOXAPARIN SODIUM 40 MG/0.4ML ~~LOC~~ SOLN
40.0000 mg | SUBCUTANEOUS | Status: DC
  Administered 2013-04-26: 40 mg via SUBCUTANEOUS
  Filled 2013-04-26 (×2): qty 0.4

## 2013-04-26 MED ORDER — LEVOTHYROXINE SODIUM 100 MCG PO TABS
100.0000 ug | ORAL_TABLET | Freq: Every day | ORAL | Status: DC
  Administered 2013-04-27: 100 ug via ORAL
  Filled 2013-04-26 (×2): qty 1

## 2013-04-26 MED ORDER — ZOLPIDEM TARTRATE 5 MG PO TABS: 5.0000 mg | ORAL_TABLET | Freq: Every evening | ORAL | Status: DC | PRN

## 2013-04-26 MED ORDER — ATORVASTATIN CALCIUM 80 MG PO TABS
80.0000 mg | ORAL_TABLET | Freq: Once | ORAL | Status: AC
  Administered 2013-04-26: 80 mg via ORAL
  Filled 2013-04-26: qty 1

## 2013-04-26 MED ORDER — SODIUM CHLORIDE 0.9 % IV SOLN: 250.0000 mL | INTRAVENOUS | Status: DC | PRN

## 2013-04-26 MED ORDER — METOPROLOL TARTRATE 50 MG PO TABS
50.0000 mg | ORAL_TABLET | Freq: Two times a day (BID) | ORAL | Status: DC
  Administered 2013-04-26 – 2013-04-27 (×2): 50 mg via ORAL
  Filled 2013-04-26 (×3): qty 1

## 2013-04-26 MED ORDER — ONDANSETRON HCL 4 MG PO TABS: 4.0000 mg | ORAL_TABLET | Freq: Four times a day (QID) | ORAL | Status: DC | PRN

## 2013-04-26 MED ORDER — ALPRAZOLAM 0.25 MG PO TABS
0.1250 mg | ORAL_TABLET | Freq: Every evening | ORAL | Status: DC | PRN
  Administered 2013-04-26: 0.125 mg via ORAL
  Filled 2013-04-26: qty 1

## 2013-04-26 MED ORDER — PANTOPRAZOLE SODIUM 40 MG PO TBEC
40.0000 mg | DELAYED_RELEASE_TABLET | Freq: Every day | ORAL | Status: DC
  Administered 2013-04-26 – 2013-04-27 (×2): 40 mg via ORAL
  Filled 2013-04-26 (×2): qty 1

## 2013-04-26 MED ORDER — SODIUM CHLORIDE 0.9 % IJ SOLN: 3.0000 mL | INTRAMUSCULAR | Status: DC | PRN

## 2013-04-26 MED ORDER — LEVOTHYROXINE SODIUM 100 MCG PO TABS
100.0000 ug | ORAL_TABLET | Freq: Every day | ORAL | Status: DC
  Filled 2013-04-26: qty 1

## 2013-04-26 MED ORDER — FUROSEMIDE 40 MG PO TABS
60.0000 mg | ORAL_TABLET | Freq: Every day | ORAL | Status: DC
  Administered 2013-04-26 – 2013-04-27 (×2): 60 mg via ORAL
  Filled 2013-04-26 (×2): qty 1

## 2013-04-26 MED ORDER — ONDANSETRON HCL 4 MG/2ML IJ SOLN: 4.0000 mg | Freq: Four times a day (QID) | INTRAMUSCULAR | Status: DC | PRN

## 2013-04-26 NOTE — ED Provider Notes (Signed)
History     CSN: [id number]  Arrival date & time 04/26/13  1050   First MD Initiated Contact with Patient 04/26/13 1148      Chief Complaint  Patient presents with  . Chest Pain     HPI Pt was seen at 1155.  Per pt, c/o gradual onset and persistence of multiple intermittent episodes of chest "pain" since yesterday.  Pt states approx 10am yesterday she developed left sided chest pain that radiated into her mid-back.  States she took ASA, motrin and placed a heating pad on her back.  Pt states the discomfort resolved after several hours, but returned later on yesterday evening several hours after eatingdinner.  States the discomfort again lasted several hours until she went to sleep.  Pt woke up this morning without discomfort. Describes the discomfort as "a grabbing pain." Denies palpitations, no SOB/cough, no abd pain, no N/V/D, no flank pain, no fevers, no rash.    Past Medical History  Diagnosis Date  . Complication of anesthesia   . PONV (postoperative nausea and vomiting)   . Hypertension   . Hyperlipidemia   . Swelling of extremity     sees Dr. Allyson Sabal, cardiologist 1 x year [phone]  . Cataract     bilaterally  . Bronchitis     Jan 2013  . Hypothyroidism     takes synthroid  . GERD (gastroesophageal reflux disease)   . Neuromuscular disorder     carpal tunnel in right hand  . Cancer     on face, removed  . Arthritis   . Depression     2005, was on medication at that time  . Insomnia     Past Surgical History  Procedure Laterality Date  . Knee arthroplasty      right knee in 2005  . Carpal tunnel release      right side  . Eye surgery      bilateral cataract surgery 2000  . Breast biopsy      left  . Cardiovascular stress test      done 12/07/11  . Doppler echocardiography      12/07/11  . Total knee arthroplasty  03/02/2012    Procedure: TOTAL KNEE ARTHROPLASTY;  Surgeon: Thera Flake., MD;  Location: [facility] OR;  Service: Orthopedics;  Laterality: Left;      History  Substance Use Topics  . Smoking status: Never Smoker   . Smokeless tobacco: Not on file  . Alcohol Use: No      Review of Systems ROS: Statement: All systems negative except as marked or noted in the HPI; Constitutional: Negative for fever and chills. ; ; Eyes: Negative for eye pain, redness and discharge. ; ; ENMT: Negative for ear pain, hoarseness, nasal congestion, sinus pressure and sore throat. ; ; Cardiovascular: +CP. Negative for palpitations, diaphoresis, dyspnea and peripheral edema. ; ; Respiratory: Negative for cough, wheezing and stridor. ; ; Gastrointestinal: Negative for nausea, vomiting, diarrhea, abdominal pain, blood in stool, hematemesis, jaundice and rectal bleeding. . ; ; Genitourinary: Negative for dysuria, flank pain and hematuria. ; ; Musculoskeletal: Negative for back pain and neck pain. Negative for swelling and trauma.; ; Skin: Negative for pruritus, rash, abrasions, blisters, bruising and skin lesion.; ; Neuro: Negative for headache, lightheadedness and neck stiffness. Negative for weakness, altered level of consciousness , altered mental status, extremity weakness, paresthesias, involuntary movement, seizure and syncope.       Allergies  Morphine and related  Home Medications  Current Outpatient Rx  Name  Route  Sig  Dispense  Refill  . ALPRAZolam (XANAX) 0.25 MG tablet   Oral   Take 0.125 mg by mouth at bedtime as needed. For sleep.         Marland Kitchen amLODipine (NORVASC) 10 MG tablet   Oral   Take 10 mg by mouth daily.          Marland Kitchen aspirin EC 81 MG tablet   Oral   Take 81 mg by mouth daily.         Marland Kitchen atorvastatin (LIPITOR) 40 MG tablet   Oral   Take 80 mg by mouth daily.          . benazepril (LOTENSIN) 40 MG tablet   Oral   Take 40 mg by mouth daily.           . eszopiclone (LUNESTA) 2 MG TABS   Oral   Take 2 mg by mouth at bedtime. Take immediately before bedtime         . Flaxseed, Linseed, (FLAXSEED OIL PO)   Oral    Take 1 tablet by mouth 2 (two) times daily.          . furosemide (LASIX) 40 MG tablet   Oral   Take 60 mg by mouth daily.          Marland Kitchen ibuprofen (ADVIL,MOTRIN) 200 MG tablet   Oral   Take 400 mg by mouth every 4 (four) hours as needed for pain.         Marland Kitchen levothyroxine (SYNTHROID, LEVOTHROID) 100 MCG tablet   Oral   Take 100 mcg by mouth daily.          . metoprolol (LOPRESSOR) 50 MG tablet   Oral   Take 50 mg by mouth 2 (two) times daily.         . Multiple Vitamins-Minerals (MACULAR VITAMIN BENEFIT PO)   Oral   Take 1 tablet by mouth 4 (four) times daily.          Marland Kitchen omeprazole (PRILOSEC) 10 MG capsule   Oral   Take 10 mg by mouth daily.          . potassium chloride SA (K-DUR,KLOR-CON) 20 MEQ tablet   Oral   Take 20 mEq by mouth 2 (two) times daily.             BP 176/75  Pulse 82  Temp(Src) 97.5 F (36.4 C) (Oral)  Resp 23  SpO2 98%  Physical Exam 1200: Physical examination:  Nursing notes reviewed; Vital signs and O2 SAT reviewed;  Constitutional: Well developed, Well nourished, Well hydrated, In no acute distress; Head:  Normocephalic, atraumatic; Eyes: EOMI, PERRL, No scleral icterus; ENMT: Mouth and pharynx normal, Mucous membranes moist; Neck: Supple, Full range of motion, No lymphadenopathy; Cardiovascular: Regular rate and rhythm, No gallop; Respiratory: Breath sounds clear & equal bilaterally, No rales, rhonchi, wheezes.  Speaking full sentences with ease, Normal respiratory effort/excursion; Chest: Nontender, Movement normal; Abdomen: Soft, Nontender, Nondistended, Normal bowel sounds; Genitourinary: No CVA tenderness; Spine:  No midline CS, TS, LS tenderness. No rash. No paraspinal tenderness to palp.;; Extremities: Pulses normal, No tenderness, No edema, No calf edema or asymmetry.; Neuro: AA&Ox3, Major CN grossly intact.  Speech clear. No gross focal motor or sensory deficits in extremities.; Skin: Color normal, Warm, Dry.   ED Course   Procedures     MDM  MDM Reviewed: previous chart, nursing note and vitals Interpretation: ECG, labs and  x-ray    Date: 04/26/2013  Rate: 66  Rhythm: normal sinus rhythm  QRS Axis: normal  Intervals: normal  ST/T Wave abnormalities: normal  Conduction Disutrbances:none  Narrative Interpretation:   Old EKG Reviewed: none available.  Results for orders placed during the hospital encounter of 04/26/13  CBC      Result Value Range   WBC 7.9  4.0 - 10.5 K/uL   RBC 4.50  3.87 - 5.11 MIL/uL   Hemoglobin 13.7  12.0 - 15.0 g/dL   HCT 30.8  65.7 - 84.6 %   MCV 90.7  78.0 - 100.0 fL   MCH 30.4  26.0 - 34.0 pg   MCHC 33.6  30.0 - 36.0 g/dL   RDW 96.2  95.2 - 84.1 %   Platelets 168  150 - 400 K/uL  BASIC METABOLIC PANEL      Result Value Range   Sodium 143  135 - 145 mEq/L   Potassium 4.0  3.5 - 5.1 mEq/L   Chloride 106  96 - 112 mEq/L   CO2 29  19 - 32 mEq/L   Glucose, Bld 114 (*) 70 - 99 mg/dL   BUN 12  6 - 23 mg/dL   Creatinine, Ser 3.24  0.50 - 1.10 mg/dL   Calcium 9.8  8.4 - 40.1 mg/dL   GFR calc non Af Amer 53 (*) >90 mL/min   GFR calc Af Amer 61 (*) >90 mL/min  POCT I-STAT TROPONIN I      Result Value Range   Troponin i, poc 0.00  0.00 - 0.08 ng/mL   Comment 3            Dg Chest 2 View 04/26/2013   *RADIOLOGY REPORT*  Clinical Data: Chest pain  CHEST - 2 VIEW  Comparison: 02/23/2012  Findings: COPD with pulmonary hyperinflation.  Lungs are clear without infiltrate or effusion.  No mass lesion.  Negative for heart failure.  Aortic calcification.  IMPRESSION: Chronic lung disease.  No acute abnormality.   Original Report Authenticated By: Janeece Riggers, M.D.    1425:  Pt continues to deny CP while in the ED.  ASA already given. Dx and testing d/w pt and family.  Questions answered.  Verb understanding, agreeable to observation admit.  T/C to [facility] Resident, case discussed, including:  HPI, pertinent PM/SHx, VS/PE, dx testing, ED course and treatment:  Agreeable to  observation admit.          Laray Anger, DO 04/28/13 2237

## 2013-04-26 NOTE — ED Notes (Signed)
Per EMS- Pt reports cp since yesterday at 1000. Intermittent, central, radiation to back. Gave herself aspirin, ibuprofen with no relief. Went to PCP today, was having CP, when EMS arrived no CP present. EKG SR. 18 Left AC. BP 154/74, HR 68. Pt ambulatory from stretcher to bed. Denies any pain at this time. Skin warm and dry.

## 2013-04-26 NOTE — H&P (Signed)
Hospital Admission Note Date: 04/26/2013  Patient name: Leah Holland Medical record number: [id number] Date of birth: [birth year] Age: 79 y.o. Gender: female PCP: Herb Grays, MD  Medical Service: IMTS  Attending physician:  Dr. Kem Kays    1st Contact: Dr. Burtis Junes   Pager: [phone] 2nd Contact: Dr. Dierdre Searles    Pager: [phone] After 5 pm or weekends: 1st Contact:      Pager: [phone] 2nd Contact:      Pager: [phone]  Chief Complaint: CP  History of Present Illness: Leah Holland is an 79 yo woman pmh of HTN, HLD, and PTSD presents after 3 days of CP. CP extends from right diaphragm to left w/o extension into jaw or arms, 5/10 intensity, no exacerbated or associated with activity and not changed with rest, and constant for 3 days with full/partial relief with heat and NSAIDS. No associated SOB, diaphoresis, or nausea. Pt stated that she visited her sister whom has dementia at the nursing home who was recently diagnosed with PNA and therefore pt had concern for PNA. She denied any symptoms of fever/chills/nausea/vomiting/diarrhea/sob/diaphoresis/HA or blurry vision. Pt has been in usual, if "not best" state of health even during CP incident. She followed up with PCP office where she continued to complain of some CP and was sent to ED for evaluation. Since being in the ED pt has not had pain and feels better.   Meds: Current Outpatient Rx  Name  Route  Sig  Dispense  Refill  . ALPRAZolam (XANAX) 0.25 MG tablet   Oral   Take 0.125 mg by mouth at bedtime as needed. For sleep.         Marland Kitchen amLODipine (NORVASC) 10 MG tablet   Oral   Take 10 mg by mouth daily.          Marland Kitchen aspirin EC 81 MG tablet   Oral   Take 81 mg by mouth daily.         Marland Kitchen atorvastatin (LIPITOR) 40 MG tablet   Oral   Take 80 mg by mouth daily.          . benazepril (LOTENSIN) 40 MG tablet   Oral   Take 40 mg by mouth daily.           . eszopiclone (LUNESTA) 2 MG TABS   Oral   Take 2 mg by mouth at bedtime. Take  immediately before bedtime         . Flaxseed, Linseed, (FLAXSEED OIL PO)   Oral   Take 1 tablet by mouth 2 (two) times daily.          . furosemide (LASIX) 40 MG tablet   Oral   Take 60 mg by mouth daily.          Marland Kitchen ibuprofen (ADVIL,MOTRIN) 200 MG tablet   Oral   Take 400 mg by mouth every 4 (four) hours as needed for pain.         Marland Kitchen levothyroxine (SYNTHROID, LEVOTHROID) 100 MCG tablet   Oral   Take 100 mcg by mouth daily.          . metoprolol (LOPRESSOR) 50 MG tablet   Oral   Take 50 mg by mouth 2 (two) times daily.         . Multiple Vitamins-Minerals (MACULAR VITAMIN BENEFIT PO)   Oral   Take 1 tablet by mouth 4 (four) times daily.          Marland Kitchen omeprazole (PRILOSEC) 10 MG capsule  Oral   Take 10 mg by mouth daily.          . potassium chloride SA (K-DUR,KLOR-CON) 20 MEQ tablet   Oral   Take 20 mEq by mouth 2 (two) times daily.             Allergies: Allergies as of 04/26/2013 - Review Complete 04/26/2013  Allergen Reaction Noted  . Morphine and related Other (See Comments) 12/19/2011   Past Medical History  Diagnosis Date  . Complication of anesthesia   . PONV (postoperative nausea and vomiting)   . Hypertension   . Hyperlipidemia   . Swelling of extremity     sees Dr. Allyson Sabal, cardiologist 1 x year [phone]  . Cataract     bilaterally  . Bronchitis     Jan 2013  . Hypothyroidism     takes synthroid  . GERD (gastroesophageal reflux disease)   . Neuromuscular disorder     carpal tunnel in right hand  . Cancer     on face, removed  . Arthritis   . Depression     2005, was on medication at that time  . Insomnia    Past Surgical History  Procedure Laterality Date  . Knee arthroplasty      right knee in 2005  . Carpal tunnel release      right side  . Eye surgery      bilateral cataract surgery 2000  . Breast biopsy      left  . Cardiovascular stress test      done 12/07/11  . Doppler echocardiography      12/07/11  .  Total knee arthroplasty  03/02/2012    Procedure: TOTAL KNEE ARTHROPLASTY;  Surgeon: Thera Flake., MD;  Location: [facility] OR;  Service: Orthopedics;  Laterality: Left;   No family history on file. History   Social History  . Marital Status: Widowed    Spouse Name: N/A    Number of Children: N/A  . Years of Education: N/A   Occupational History  . Not on file.   Social History Main Topics  . Smoking status: Never Smoker   . Smokeless tobacco: Not on file  . Alcohol Use: No  . Drug Use: No  . Sexually Active:    Other Topics Concern  . Not on file   Social History Narrative  . No narrative on file    Review of Systems: Pertinent items are noted in HPI.  Physical Exam: Blood pressure 176/75, pulse 82, temperature 97.5 F (36.4 C), temperature source Oral, resp. rate 23, SpO2 98.00%. General: resting in bed, NAD, very pleasant HEENT: PERRL, EOMI, no scleral icterus, MMM Cardiac: RRR, no rubs, murmurs or gallops, slight pain upon palpation across right diaphragm and left pectoralis muscle Pulm: clear to auscultation bilaterally, moving normal volumes of air Abd: soft, nontender, nondistended, BS present Ext: warm and well perfused, no pedal edema Neuro: alert and oriented X3, cranial nerves II-XII grossly intact  Lab results: Basic Metabolic Panel:  Recent Labs  09/81/19 1122  NA 143  K 4.0  CL 106  CO2 29  GLUCOSE 114*  BUN 12  CREATININE 0.94  CALCIUM 9.8   CBC:  Recent Labs  04/26/13 1122  WBC 7.9  HGB 13.7  HCT 40.8  MCV 90.7  PLT 168    Imaging results:  Dg Chest 2 View  04/26/2013   *RADIOLOGY REPORT*  Clinical Data: Chest pain  CHEST - 2 VIEW  Comparison: 02/23/2012  Findings: COPD with pulmonary hyperinflation.  Lungs are clear without infiltrate or effusion.  No mass lesion.  Negative for heart failure.  Aortic calcification.  IMPRESSION: Chronic lung disease.  No acute abnormality.   Original Report Authenticated By: Janeece Riggers, M.D.     Other results: EKG: normal EKG, normal sinus rhythm, unchanged from previous tracings, low voltage.  Assessment & Plan by Problem: 1. Chest Pain: Pt pretest probability for typical CP very low given hx and description of pain along with initial negative troponin. Most likely MSK in nature given relief with heat and NSAIDS. Pt also has a hx of GERD but reports compliance with meds. EKG with no acute changes from baseline and no new signs of ischemia or pericarditis. Pt reports having had full cardiology workup for TKR in 2013 by her cardiologist Dr. Gery Pray (these records are not available). Pt has not had any recent prolonged travel, hx of DVT/PE, or any hormonal medications. CXR showed some hyperinflation but no infiltrates or acute cardiopulmonary process along with not meeting SIRS criteria makes PNA less likely although pt had direct exposure. -EKG in AM -trops x3 -tele  2. HTN: pt has hx of known HTN especially whitecoat associated per the patient.  -cont home meds  3. Insomnia: pt self reports long standing hx of PTSD relating to death of son in [place] war and death of husband from traumatic ICU admission and needing to use Xanax to sleep. Pt has anxiety about being left alone as well.  -Xanax 0.5mg  QHS  Dispo: Disposition is deferred at this time, awaiting improvement of current medical problems. Anticipated discharge in approximately 1 day(s).   The patient does have a current PCP (SPEAR, TAMMY, MD), therefore will be requiring [facility] follow-up after discharge.   The patient does not have transportation limitations that hinder transportation to clinic appointments.  Signed: Christen Bame 04/26/2013, 2:50 PM  Pgr: [phone]

## 2013-04-26 NOTE — ED Notes (Addendum)
Attempted to give report, nurse to call back in 5 mins.

## 2013-04-27 ENCOUNTER — Observation Stay (HOSPITAL_COMMUNITY): Payer: Medicare Other

## 2013-04-27 DIAGNOSIS — R079 Chest pain, unspecified: Secondary | ICD-10-CM

## 2013-04-27 DIAGNOSIS — I10 Essential (primary) hypertension: Secondary | ICD-10-CM

## 2013-04-27 LAB — TSH: TSH: 1.812 u[IU]/mL (ref 0.350–4.500)

## 2013-04-27 LAB — CBC
MCH: 30.6 pg (ref 26.0–34.0)
MCHC: 34.2 g/dL (ref 30.0–36.0)
MCV: 89.5 fL (ref 78.0–100.0)
Platelets: 166 10*3/uL (ref 150–400)
RDW: 13.3 % (ref 11.5–15.5)

## 2013-04-27 LAB — BASIC METABOLIC PANEL
BUN: 12 mg/dL (ref 6–23)
Calcium: 9.2 mg/dL (ref 8.4–10.5)
Creatinine, Ser: 0.82 mg/dL (ref 0.50–1.10)
GFR calc Af Amer: 72 mL/min — ABNORMAL LOW (ref >90)

## 2013-04-27 MED ORDER — POTASSIUM CHLORIDE 10 MEQ/100ML IV SOLN
10.0000 meq | INTRAVENOUS | Status: AC
  Administered 2013-04-27 (×6): 10 meq via INTRAVENOUS
  Filled 2013-04-27 (×6): qty 100

## 2013-04-27 MED ORDER — ENSURE COMPLETE PO LIQD: 237.0000 mL | Freq: Two times a day (BID) | ORAL | Status: DC

## 2013-04-27 MED ORDER — POTASSIUM CITRATE ER 10 MEQ (1080 MG) PO TBCR
20.0000 meq | EXTENDED_RELEASE_TABLET | Freq: Three times a day (TID) | ORAL | Status: DC
  Administered 2013-04-27: 20 meq via ORAL
  Filled 2013-04-27 (×5): qty 2

## 2013-04-27 MED ORDER — IOHEXOL 350 MG/ML SOLN
100.0000 mL | Freq: Once | INTRAVENOUS | Status: AC | PRN
  Administered 2013-04-27: 100 mL via INTRAVENOUS

## 2013-04-27 MED ORDER — MAGNESIUM SULFATE 40 MG/ML IJ SOLN
2.0000 g | Freq: Once | INTRAMUSCULAR | Status: AC
  Administered 2013-04-27: 2 g via INTRAVENOUS
  Filled 2013-04-27: qty 50

## 2013-04-27 MED ORDER — ALPRAZOLAM 0.25 MG PO TABS
0.1250 mg | ORAL_TABLET | Freq: Once | ORAL | Status: AC
  Administered 2013-04-27: 0.125 mg via ORAL
  Filled 2013-04-27: qty 1

## 2013-04-27 NOTE — Consult Note (Addendum)
CONSULT NOTE  Date: 04/27/2013               Patient Name:  Leah Holland MRN: [id number]  DOB: 06-15-1922 Age / Sex: 79 y.o., female        PCP: Herb Grays Primary Cardiologist: Andree Coss, MD            Referring Physician: Dr. Thomos Lemons, DO              Reason for Consult: Chest pain            History of Present Illness: Patient is a 79 y.o. female with a PMHx of HTN, hyperlipidemia , who was admitted to [facility] on 04/26/2013 for evaluation of an episode of chest pressure that occurred yesterday. The chest pain started when she was sitting down in the car. The pain radiated across her chest.  It was like a quick catch in her chest.   The pain last all morning.  She went home and applied a heating pad.  She took a nap, woke up and the pain was still there.  She took several Advil.  The pain lasted 5 hours.    She ate dinner.  The pain returned and lasted for 12 hours.   Not bad pain - just knew the discomfort was there.  She went to Dr. Alda Berthold office.  There was no  shortness of breath. She had never experienced this pain before.   No cough, no fever.   She has had intermittant leg swelling in the past.  She sees Dr. Andree Coss once a year.    She did not have any EKG changes. Her cardiac enzymes are negative.   + family history of heart problems on her mothers side.   Medications: Outpatient medications: Prescriptions prior to admission  Medication Sig Dispense Refill  . ALPRAZolam (XANAX) 0.25 MG tablet Take 0.125 mg by mouth at bedtime as needed. For sleep.      Marland Kitchen amLODipine (NORVASC) 10 MG tablet Take 10 mg by mouth daily.       Marland Kitchen aspirin EC 81 MG tablet Take 81 mg by mouth daily.      Marland Kitchen atorvastatin (LIPITOR) 40 MG tablet Take 80 mg by mouth daily.       . benazepril (LOTENSIN) 40 MG tablet Take 40 mg by mouth daily.        . eszopiclone (LUNESTA) 2 MG TABS Take 2 mg by mouth at bedtime. Take immediately before bedtime      . Flaxseed, Linseed, (FLAXSEED  OIL PO) Take 1 tablet by mouth 2 (two) times daily.       . furosemide (LASIX) 40 MG tablet Take 60 mg by mouth daily.       Marland Kitchen ibuprofen (ADVIL,MOTRIN) 200 MG tablet Take 400 mg by mouth every 4 (four) hours as needed for pain.      Marland Kitchen levothyroxine (SYNTHROID, LEVOTHROID) 100 MCG tablet Take 100 mcg by mouth daily.       . metoprolol (LOPRESSOR) 50 MG tablet Take 50 mg by mouth 2 (two) times daily.      . Multiple Vitamins-Minerals (MACULAR VITAMIN BENEFIT PO) Take 1 tablet by mouth 4 (four) times daily.       Marland Kitchen omeprazole (PRILOSEC) 10 MG capsule Take 10 mg by mouth daily.       . potassium chloride SA (K-DUR,KLOR-CON) 20 MEQ tablet Take 20 mEq by mouth 2 (two) times daily.  Current medications: Current Facility-Administered Medications  Medication Dose Route Frequency Provider Last Rate Last Dose  . 0.9 %  sodium chloride infusion  250 mL Intravenous PRN Na Dierdre Searles, MD      . ALPRAZolam Prudy Feeler) tablet 0.125 mg  0.125 mg Oral QHS PRN Na Dierdre Searles, MD   0.125 mg at 04/26/13 2246  . amLODipine (NORVASC) tablet 10 mg  10 mg Oral Daily Na Li, MD   10 mg at 04/27/13 0932  . aspirin EC tablet 81 mg  81 mg Oral Daily Na Li, MD   81 mg at 04/27/13 0932  . atorvastatin (LIPITOR) tablet 80 mg  80 mg Oral q1800 Lars Mage, MD      . benazepril (LOTENSIN) tablet 40 mg  40 mg Oral Daily Na Li, MD   40 mg at 04/27/13 0936  . enoxaparin (LOVENOX) injection 40 mg  40 mg Subcutaneous Q24H Na Li, MD   40 mg at 04/26/13 1839  . feeding supplement (ENSURE COMPLETE) liquid 237 mL  237 mL Oral BID BM Heather Cornelison Pitts, RD      . furosemide (LASIX) tablet 60 mg  60 mg Oral Daily Na Li, MD   60 mg at 04/27/13 0932  . levothyroxine (SYNTHROID, LEVOTHROID) tablet 100 mcg  100 mcg Oral QAC breakfast Lars Mage, MD   100 mcg at 04/27/13 0834  . metoprolol (LOPRESSOR) tablet 50 mg  50 mg Oral BID Na Li, MD   50 mg at 04/27/13 0935  . ondansetron (ZOFRAN) tablet 4 mg  4 mg Oral Q6H PRN Na Li, MD       Or  .  ondansetron (ZOFRAN) injection 4 mg  4 mg Intravenous Q6H PRN Na Li, MD      . pantoprazole (PROTONIX) EC tablet 40 mg  40 mg Oral Daily Na Li, MD   40 mg at 04/27/13 0932  . potassium chloride 10 mEq in 100 mL IVPB  10 mEq Intravenous Q1 Hr x 6 Alejandro Paya, DO   10 mEq at 04/27/13 1349  . sodium chloride 0.9 % injection 3 mL  3 mL Intravenous Q12H Na Li, MD   3 mL at 04/26/13 2246  . sodium chloride 0.9 % injection 3 mL  3 mL Intravenous Q12H Na Li, MD   3 mL at 04/27/13 0835  . sodium chloride 0.9 % injection 3 mL  3 mL Intravenous PRN Na Li, MD      . zolpidem (AMBIEN) tablet 5 mg  5 mg Oral QHS PRN Dede Query, MD         Allergies  Allergen Reactions  . Morphine And Related Other (See Comments)    Made pt "wild"     Past Medical History  Diagnosis Date  . Complication of anesthesia   . PONV (postoperative nausea and vomiting)   . Hypertension   . Hyperlipidemia   . Swelling of extremity     sees Dr. Allyson Sabal, cardiologist 1 x year [phone]  . Cataract     bilaterally  . Bronchitis     Jan 2013  . Hypothyroidism     takes synthroid  . GERD (gastroesophageal reflux disease)   . Neuromuscular disorder     carpal tunnel in right hand  . Arthritis   . Depression     2005, was on medication at that time  . Insomnia     Past Surgical History  Procedure Laterality Date  . Knee arthroplasty      right knee  in 2005  . Carpal tunnel release      right side  . Eye surgery      bilateral cataract surgery 2000  . Breast biopsy      left  . Cardiovascular stress test      done 12/07/11  . Doppler echocardiography      12/07/11  . Total knee arthroplasty  03/02/2012    Procedure: TOTAL KNEE ARTHROPLASTY;  Surgeon: Thera Flake., MD;  Location: [facility] OR;  Service: Orthopedics;  Laterality: Left;    No family history on file.  Social History:  reports that she has never smoked. She does not have any smokeless tobacco history on file. She reports that she does not drink  alcohol or use illicit drugs.   Review of Systems: Constitutional:  denies fever, chills, diaphoresis, appetite change and fatigue.  HEENT: denies photophobia, eye pain, redness, hearing loss, ear pain, congestion, sore throat, rhinorrhea, sneezing, neck pain, neck stiffness and tinnitus.  Respiratory: denies SOB, DOE, cough, chest tightness, and wheezing.  Cardiovascular: admits to chest pain,    Gastrointestinal: denies nausea, vomiting, abdominal pain, diarrhea, constipation, blood in stool.  Genitourinary: denies dysuria, urgency, frequency, hematuria, flank pain and difficulty urinating.  Musculoskeletal: denies  myalgias, back pain, joint swelling, arthralgias and gait problem.   Skin: denies pallor, rash and wound.  Neurological: denies dizziness, seizures, syncope, weakness, light-headedness, numbness and headaches.   Hematological: denies adenopathy, easy bruising, personal or family bleeding history.  Psychiatric/ Behavioral: denies suicidal ideation, mood changes, confusion, nervousness, sleep disturbance and agitation.    Physical Exam: BP 129/55  Pulse 66  Temp(Src) 98 F (36.7 C) (Oral)  Resp 17  Ht 5\' 4"  (1.626 m)  Wt 124 lb 14.4 oz (56.654 kg)  BMI 21.43 kg/m2  SpO2 93%  General: Vital signs reviewed and noted. Well-developed, well-nourished, in no acute distress; alert, appropriate and cooperative throughout examination.  Head: Normocephalic, atraumatic, sclera anicteric, mucus membranes are moist  Neck: Supple. Negative for carotid bruits. JVD not elevated.  Lungs:  Clear bilaterally to auscultation without wheezes, rales, or rhonchi. Breathing is unlabored.  Heart: RRR with S1 S2. No murmurs, rubs, or gallops appreciated.  Abdomen:  Soft, non-tender, non-distended with normoactive bowel sounds. No hepatomegaly. No rebound/guarding. No obvious abdominal masses  MSK: Strength and the appear normal for age.  Extremities: No clubbing or cyanosis. No edema.  Distal  pedal pulses are 2+ and equal bilaterally.  Neurologic: Alert and oriented X 3. Moves all extremities spontaneously.  Psych: Responds to questions appropriately with a normal affect.    Lab results: Basic Metabolic Panel:  Recent Labs Lab 04/26/13 1122 04/26/13 1905 04/27/13 0120  NA 143  --  143  K 4.0  --  2.9*  CL 106  --  104  CO2 29  --  30  GLUCOSE 114*  --  102*  BUN 12  --  12  CREATININE 0.94  --  0.82  CALCIUM 9.8  --  9.2  MG  --  1.7  --     Liver Function Tests:  Recent Labs Lab 04/26/13 1905  AST 13  ALT 19  ALKPHOS 113  BILITOT 0.9  PROT 6.5  ALBUMIN 4.1   No results found for this basename: LIPASE, AMYLASE,  in the last 168 hours No results found for this basename: AMMONIA,  in the last 168 hours  CBC:  Recent Labs Lab 04/26/13 1122 04/27/13 0120  WBC 7.9 9.1  HGB 13.7  12.8  HCT 40.8 37.4  MCV 90.7 89.5  PLT 168 166    Cardiac Enzymes:  Recent Labs Lab 04/26/13 1922 04/27/13 0121  TROPONINI <0.30 <0.30    BNP: No components found with this basename: POCBNP,   CBG: No results found for this basename: GLUCAP,  in the last 168 hours  Coagulation Studies: No results found for this basename: LABPROT, INR,  in the last 72 hours   Other results: EKG NSR  , normal, no ST or T wave changes.   Imaging: Dg Chest 2 View  04/26/2013   *RADIOLOGY REPORT*  Clinical Data: Chest pain  CHEST - 2 VIEW  Comparison: 02/23/2012  Findings: COPD with pulmonary hyperinflation.  Lungs are clear without infiltrate or effusion.  No mass lesion.  Negative for heart failure.  Aortic calcification.  IMPRESSION: Chronic lung disease.  No acute abnormality.   Original Report Authenticated By: Janeece Riggers, M.D.   Ct Angio Chest Pe W/cm &/or Wo Cm  04/27/2013   *RADIOLOGY REPORT*  Clinical Data: 79 year old female with chest pain and shortness of breath.  CT ANGIOGRAPHY CHEST  Technique:  Multidetector CT imaging of the chest using the standard protocol  during bolus administration of intravenous contrast. Multiplanar reconstructed images including MIPs were obtained and reviewed to evaluate the vascular anatomy.  Contrast: OMNIPAQUE IOHEXOL 350 MG/ML SOLN  Comparison: 04/26/2013 and prior chest radiographs  Findings: This is a technically satisfactory study.  No pulmonary emboli are identified. Cardiomegaly is identified. Coronary artery and thoracic aortic atherosclerotic calcifications are present without thoracic aortic aneurysm. There are no pleural or pericardial effusions. No enlarged lymph nodes are identified.  Minimal dependent and basilar atelectasis noted. There is no evidence of airspace disease, consolidation, suspicious nodule, mass or endobronchial/endotracheal lesion.  The visualized upper abdomen is unremarkable. No acute or suspicious bony abnormalities are identified.  IMPRESSION: No evidence of pulmonary emboli or thoracic aortic aneurysm.  Minimal dependent and bibasilar atelectasis/scarring.  Cardiomegaly and coronary artery disease.   Original Report Authenticated By: Harmon Pier, M.D.      Assessment & Plan:  1. Chest tightness:   Ms. Smart presents with episodes of atypical chest pain. She had about 5 hours of chest pain on Thursday and another 12 hours of chest pain on Thursday night. Despite this, her cardiac enzymes are negative.  The chest pain is fairly atypical. She does have a family history of chest pain  She's   not having any further episodes of chest pain at this time.  Using the HEART scoring system, she has a score of 3. (2 points for being age 22, 1 point for  hyperlipidemia/ HTN)   I think she could go home an be followed [place] as OP.  She sees Dr. Andree Coss  at [facility] yearly and should see him in follow up if needed  2. Hypokalemia / hypomagnesemia  The medical team is addressing  3. HTN:  Well controlled.    DVT PPX -   Alvia Grove., MD, [facility] 04/27/2013, 2:03  PM

## 2013-04-27 NOTE — Progress Notes (Signed)
INITIAL NUTRITION ASSESSMENT  DOCUMENTATION CODES Per approved criteria  -Not Applicable   INTERVENTION: 1. Ensure Complete po BID, each supplement provides 350 kcal and 13 grams of protein.  2. Encouraged pt to disucss menu alternates with [facility] and to provide them feedback on current menu offerings.   NUTRITION DIAGNOSIS: Inadequate oral intake related to decreased appetite as evidenced by meal completion < 50%.   Goal: Pt to meet >/= 90% of their estimated nutrition needs.   Monitor:  PO intake, supplement acceptance, weight trend, labs  Reason for Assessment: Pt identified as at nutrition risk on the Malnutrition Screen Tool  79 y.o. female  Admitting Dx: Chest pain  ASSESSMENT: Pt reports weight loss after having knee replacement in 02/2012. Pt then moved out of her home she lived in for 35 years and into [facility]. Pt has not gained back much of the weight she lost due to not liking the food at [facility]. Pt reports being a stressful person who loses weight quickly.  Pt with 13% weight loss x 14 months.  Nutrition Focused Physical Exam:  Subcutaneous Fat:  Orbital Region: WNL Upper Arm Region: WNL - large skin flap likely due to weight loss Thoracic and Lumbar Region: WNL  Muscle:  Temple Region: mild-moderate wasting Clavicle Bone Region: severe wasting Clavicle and Acromion Bone Region: severe wasting Scapular Bone Region: severe wasting Dorsal Hand: WNL Patellar Region: WNL Anterior Thigh Region: mild-moderate wasting Posterior Calf Region: mild-moderate wasting  Edema: not present   Height: Ht Readings from Last 1 Encounters:  04/26/13 5\' 4"  (1.626 m)    Weight: Wt Readings from Last 1 Encounters:  04/27/13 124 lb 14.4 oz (56.654 kg)    Ideal Body Weight: 54.5 kg  % Ideal Body Weight: 104%  Wt Readings from Last 10 Encounters:  04/27/13 124 lb 14.4 oz (56.654 kg)  02/23/12 142 lb 14.4 oz (64.819 kg)    Usual Body Weight: 142  lb   % Usual Body Weight: 87%  BMI:  Body mass index is 21.43 kg/(m^2). WNL   Estimated Nutritional Needs: Kcal: 1300-1500 Protein: 65-75 grams Fluid: >1.5 L/day  Skin: no issues noted  Diet Order: Cardiac Meal Completion: 40%   EDUCATION NEEDS: -No education needs identified at this time   Intake/Output Summary (Last 24 hours) at 04/27/13 0913 Last data filed at 04/26/13 1900  Gross per 24 hour  Intake    240 ml  Output      0 ml  Net    240 ml    Last BM: PTA   Labs:   Recent Labs Lab 04/26/13 1122 04/26/13 1905 04/27/13 0120  NA 143  --  143  K 4.0  --  2.9*  CL 106  --  104  CO2 29  --  30  BUN 12  --  12  CREATININE 0.94  --  0.82  CALCIUM 9.8  --  9.2  MG  --  1.7  --   GLUCOSE 114*  --  102*    CBG (last 3)  No results found for this basename: GLUCAP,  in the last 72 hours  Scheduled Meds: . amLODipine  10 mg Oral Daily  . aspirin EC  81 mg Oral Daily  . atorvastatin  80 mg Oral q1800  . benazepril  40 mg Oral Daily  . enoxaparin (LOVENOX) injection  40 mg Subcutaneous Q24H  . furosemide  60 mg Oral Daily  . levothyroxine  100 mcg Oral QAC  breakfast  . magnesium sulfate 1 - 4 g bolus IVPB  2 g Intravenous Once  . metoprolol  50 mg Oral BID  . pantoprazole  40 mg Oral Daily  . potassium chloride  10 mEq Intravenous Q1 Hr x 6  . sodium chloride  3 mL Intravenous Q12H  . sodium chloride  3 mL Intravenous Q12H    Continuous Infusions:   Past Medical History  Diagnosis Date  . Complication of anesthesia   . PONV (postoperative nausea and vomiting)   . Hypertension   . Hyperlipidemia   . Swelling of extremity     sees Dr. Allyson Sabal, cardiologist 1 x year [phone]  . Cataract     bilaterally  . Bronchitis     Jan 2013  . Hypothyroidism     takes synthroid  . GERD (gastroesophageal reflux disease)   . Neuromuscular disorder     carpal tunnel in right hand  . Arthritis   . Depression     2005, was on medication at that time  .  Insomnia     Past Surgical History  Procedure Laterality Date  . Knee arthroplasty      right knee in 2005  . Carpal tunnel release      right side  . Eye surgery      bilateral cataract surgery 2000  . Breast biopsy      left  . Cardiovascular stress test      done 12/07/11  . Doppler echocardiography      12/07/11  . Total knee arthroplasty  03/02/2012    Procedure: TOTAL KNEE ARTHROPLASTY;  Surgeon: Thera Flake., MD;  Location: [facility] OR;  Service: Orthopedics;  Laterality: Left;    Kendell Bane RD, LDN, CNSC [phone] Pager [phone] After Hours Pager

## 2013-04-27 NOTE — Progress Notes (Signed)
Notified Dr. Zada Girt of K level 2.9. No critical call was received from the lab. New orders received. Patient has remained NSR with no ectopy. Will continue to monitor. Earnest Conroy RN

## 2013-04-27 NOTE — Progress Notes (Signed)
UR Completed Phoebie Shad Graves-Bigelow, RN,BSN [phone]  

## 2013-04-27 NOTE — Discharge Summary (Signed)
Physician Discharge Summary  Patient ID: AXEL MEAS MRN: [id number] DOB/AGE: 77/02/1922 77 y.o.  Admit date: 04/26/2013 Discharge date: 04/27/2013  Admission Diagnoses: Atypical Chest Pain  Discharge Diagnoses:  Principal Problem:   Chest pain Active Problems:   Hypertension   Hyperlipidemia   Swelling of extremity   Hypothyroidism   GERD (gastroesophageal reflux disease)   Insomnia   Discharged Condition: good  Hospital Course: 79 yr. Old WF w/ hx of HTN, HL, PTSD?, presented with CP. She states she noticed an abrupt onset on pressure like CP while she was sitting down talking in her car. She was ruled out for ACS while in the hospital. A CTA of her chest was performed which did not show any evidence of PE. She was evaluated by cardiology Dr. Melburn Popper who feels she can follow up with her cardiologist Dr. Allyson Sabal as an outpatient. She has not had any further episodes of CP and is hemodynamically stable.   Consults: cardiology  Significant Diagnostic Studies: labs:  Cardiac Panel (last 3 results)  Recent Labs  04/26/13 1922 04/27/13 0121  TROPONINI <0.30 <0.30    and radiology: Dg Chest 2 View  04/26/2013   *RADIOLOGY REPORT*  Clinical Data: Chest pain  CHEST - 2 VIEW  Comparison: 02/23/2012  Findings: COPD with pulmonary hyperinflation.  Lungs are clear without infiltrate or effusion.  No mass lesion.  Negative for heart failure.  Aortic calcification.  IMPRESSION: Chronic lung disease.  No acute abnormality.   Original Report Authenticated By: Janeece Riggers, M.D.   Ct Angio Chest Pe W/cm &/or Wo Cm  04/27/2013   *RADIOLOGY REPORT*  Clinical Data: 79 year old female with chest pain and shortness of breath.  CT ANGIOGRAPHY CHEST  Technique:  Multidetector CT imaging of the chest using the standard protocol during bolus administration of intravenous contrast. Multiplanar reconstructed images including MIPs were obtained and reviewed to evaluate the vascular anatomy.  Contrast:  OMNIPAQUE IOHEXOL 350 MG/ML SOLN  Comparison: 04/26/2013 and prior chest radiographs  Findings: This is a technically satisfactory study.  No pulmonary emboli are identified. Cardiomegaly is identified. Coronary artery and thoracic aortic atherosclerotic calcifications are present without thoracic aortic aneurysm. There are no pleural or pericardial effusions. No enlarged lymph nodes are identified.  Minimal dependent and basilar atelectasis noted. There is no evidence of airspace disease, consolidation, suspicious nodule, mass or endobronchial/endotracheal lesion.  The visualized upper abdomen is unremarkable. No acute or suspicious bony abnormalities are identified.  IMPRESSION: No evidence of pulmonary emboli or thoracic aortic aneurysm.  Minimal dependent and bibasilar atelectasis/scarring.  Cardiomegaly and coronary artery disease.   Original Report Authenticated By: Harmon Pier, M.D.     Discharge Exam: Blood pressure 129/55, pulse 66, temperature 98 F (36.7 C), temperature source Oral, resp. rate 17, height 5\' 4"  (1.626 m), weight 124 lb 14.4 oz (56.654 kg), SpO2 93.00%.  Disposition: home  Discharge Orders   Future Orders Complete By Expires     Discharge patient  As directed     Comments:      After potassium is replaced IV.        Medication List    TAKE these medications       ALPRAZolam 0.25 MG tablet  Commonly known as:  XANAX  Take 0.125 mg by mouth at bedtime as needed. For sleep.     amLODipine 10 MG tablet  Commonly known as:  NORVASC  Take 10 mg by mouth daily.     aspirin EC 81 MG tablet  Take 81 mg by mouth daily.     atorvastatin 40 MG tablet  Commonly known as:  LIPITOR  Take 80 mg by mouth daily.     benazepril 40 MG tablet  Commonly known as:  LOTENSIN  Take 40 mg by mouth daily.     eszopiclone 2 MG Tabs  Commonly known as:  LUNESTA  Take 2 mg by mouth at bedtime. Take immediately before bedtime     FLAXSEED OIL PO  Take 1 tablet by mouth 2  (two) times daily.     furosemide 40 MG tablet  Commonly known as:  LASIX  Take 60 mg by mouth daily.     ibuprofen 200 MG tablet  Commonly known as:  ADVIL,MOTRIN  Take 400 mg by mouth every 4 (four) hours as needed for pain.     levothyroxine 100 MCG tablet  Commonly known as:  SYNTHROID, LEVOTHROID  Take 100 mcg by mouth daily.     MACULAR VITAMIN BENEFIT PO  Take 1 tablet by mouth 4 (four) times daily.     metoprolol 50 MG tablet  Commonly known as:  LOPRESSOR  Take 50 mg by mouth 2 (two) times daily.     omeprazole 10 MG capsule  Commonly known as:  PRILOSEC  Take 10 mg by mouth daily.     potassium chloride SA 20 MEQ tablet  Commonly known as:  K-DUR,KLOR-CON  Take 20 mEq by mouth 2 (two) times daily.           Follow-up Information   Follow up with Herb Grays, MD.   Contact information:   [address] [place] [address] [phone]       Follow up with Runell Gess, MD In 2 weeks.   Contact information:   [address] [place] [address] [phone]       Signed: Jonah Blue 04/27/2013, 2:47 PM

## 2013-04-27 NOTE — H&P (Signed)
INTERNAL MEDICINE TEACHING SERVICE Attending Admission Note  Date: 04/27/2013  Patient name: Leah Holland  Medical record number: [id number]  Date of birth: 06/17/1922    I have seen and evaluated [name] and discussed their care with the Residency Team.  89 yr. Old WF w/ hx of HTN, HL, PTSD?, presented with CP. She states she noticed an abrupt onset on pressure like CP while she was sitting down talking in her car.  She states the pain radiated from her right shoulder/arm, across her chest, to her left shoulder/arm. She states no real SOB, but the pain was odd and she has never had this before. She stated she got up to walk towards her apartment and her pain got worse. It slowly went away over time with rest. She states she tried some OTC analgesics and a hot pack to her back and got some relief.  She was seen by her PCP who recommended she be seen in the ED right away. On admission, she was noted to have no significant ST changes on EKG. No tachycardia, but she is on a BB.  She was noted to be tachypneic up to 28 bpm.  No hypoxia noted.  She states she has not "really" had CP overnight. She has had three negative Trop I. I discussed with her the possible etiologies of her pain. Her TIMI Risk score is 3. Her revised Geneva score is 4. She agrees to undergo a CTA of her chest and to discuss with cardiology stress testing if this is negative.  Physical Exam: Blood pressure 155/69, pulse 86, temperature 98.3 F (36.8 C), temperature source Oral, resp. rate 18, height 5\' 4"  (1.626 m), weight 124 lb 14.4 oz (56.654 kg), SpO2 95.00%.  General: Vital signs reviewed and noted. Well-developed, well-nourished, in no acute distress; alert, appropriate and cooperative throughout examination.  Head: Normocephalic, atraumatic.  Eyes: PERRL, EOMI, No signs of anemia or jaundince.  Nose: Mucous membranes moist, not inflammed, nonerythematous.  Throat: Oropharynx nonerythematous, no exudate  appreciated.   Neck: No deformities, masses, or tenderness noted.Supple, No carotid Bruits, no JVD.  Lungs:  Normal respiratory effort. Clear to auscultation BL without crackles or wheezes.  Heart: RRR. S1 and S2 normal without gallop, murmur, or rubs.  Abdomen:  BS normoactive. Soft, Nondistended, non-tender.  No masses or organomegaly.  Extremities: No pretibial edema.  Neurologic: A&O X3, CN II - XII are grossly intact. Motor strength is 5/5 in the all 4 extremities, Sensations intact to light touch, Cerebellar signs negative.  Skin: No visible rashes, scars.    Lab results: Results for orders placed during the hospital encounter of 04/26/13 (from the past 24 hour(s))  CBC     Status: None   Collection Time    04/26/13 11:22 AM      Result Value Range   WBC 7.9  4.0 - 10.5 K/uL   RBC 4.50  3.87 - 5.11 MIL/uL   Hemoglobin 13.7  12.0 - 15.0 g/dL   HCT 62.9  52.8 - 41.3 %   MCV 90.7  78.0 - 100.0 fL   MCH 30.4  26.0 - 34.0 pg   MCHC 33.6  30.0 - 36.0 g/dL   RDW 24.4  01.0 - 27.2 %   Platelets 168  150 - 400 K/uL  BASIC METABOLIC PANEL     Status: Abnormal   Collection Time    04/26/13 11:22 AM      Result Value Range   Sodium 143  135 - 145 mEq/L   Potassium 4.0  3.5 - 5.1 mEq/L   Chloride 106  96 - 112 mEq/L   CO2 29  19 - 32 mEq/L   Glucose, Bld 114 (*) 70 - 99 mg/dL   BUN 12  6 - 23 mg/dL   Creatinine, Ser 1.47  0.50 - 1.10 mg/dL   Calcium 9.8  8.4 - 82.9 mg/dL   GFR calc non Af Amer 53 (*) >90 mL/min   GFR calc Af Amer 61 (*) >90 mL/min  POCT I-STAT TROPONIN I     Status: None   Collection Time    04/26/13 11:35 AM      Result Value Range   Troponin i, poc 0.00  0.00 - 0.08 ng/mL   Comment 3           MAGNESIUM     Status: None   Collection Time    04/26/13  7:05 PM      Result Value Range   Magnesium 1.7  1.5 - 2.5 mg/dL  HEPATIC FUNCTION PANEL     Status: None   Collection Time    04/26/13  7:05 PM      Result Value Range   Total Protein 6.5  6.0 - 8.3 g/dL    Albumin 4.1  3.5 - 5.2 g/dL   AST 13  0 - 37 U/L   ALT 19  0 - 35 U/L   Alkaline Phosphatase 113  39 - 117 U/L   Total Bilirubin 0.9  0.3 - 1.2 mg/dL   Bilirubin, Direct 0.2  0.0 - 0.3 mg/dL   Indirect Bilirubin 0.7  0.3 - 0.9 mg/dL  TSH     Status: None   Collection Time    04/26/13  7:05 PM      Result Value Range   TSH 1.812  0.350 - 4.500 uIU/mL  HEMOGLOBIN A1C     Status: Abnormal   Collection Time    04/26/13  7:05 PM      Result Value Range   Hemoglobin A1C 5.8 (*) <5.7 %   Mean Plasma Glucose 120 (*) <117 mg/dL  TROPONIN I     Status: None   Collection Time    04/26/13  7:22 PM      Result Value Range   Troponin I <0.30  <0.30 ng/mL  LIPID PANEL     Status: None   Collection Time    04/26/13  8:59 PM      Result Value Range   Cholesterol 116  0 - 200 mg/dL   Triglycerides 56  <562 mg/dL   HDL 45  >13 mg/dL   Total CHOL/HDL Ratio 2.6     VLDL 11  0 - 40 mg/dL   LDL Cholesterol 60  0 - 99 mg/dL  CBC     Status: None   Collection Time    04/27/13  1:20 AM      Result Value Range   WBC 9.1  4.0 - 10.5 K/uL   RBC 4.18  3.87 - 5.11 MIL/uL   Hemoglobin 12.8  12.0 - 15.0 g/dL   HCT 08.6  57.8 - 46.9 %   MCV 89.5  78.0 - 100.0 fL   MCH 30.6  26.0 - 34.0 pg   MCHC 34.2  30.0 - 36.0 g/dL   RDW 62.9  52.8 - 41.3 %   Platelets 166  150 - 400 K/uL  BASIC METABOLIC PANEL     Status: Abnormal  Collection Time    04/27/13  1:20 AM      Result Value Range   Sodium 143  135 - 145 mEq/L   Potassium 2.9 (*) 3.5 - 5.1 mEq/L   Chloride 104  96 - 112 mEq/L   CO2 30  19 - 32 mEq/L   Glucose, Bld 102 (*) 70 - 99 mg/dL   BUN 12  6 - 23 mg/dL   Creatinine, Ser 1.61  0.50 - 1.10 mg/dL   Calcium 9.2  8.4 - 09.6 mg/dL   GFR calc non Af Amer 62 (*) >90 mL/min   GFR calc Af Amer 72 (*) >90 mL/min  TROPONIN I     Status: None   Collection Time    04/27/13  1:21 AM      Result Value Range   Troponin I <0.30  <0.30 ng/mL    Imaging results:  Dg Chest 2 View  04/26/2013    *RADIOLOGY REPORT*  Clinical Data: Chest pain  CHEST - 2 VIEW  Comparison: 02/23/2012  Findings: COPD with pulmonary hyperinflation.  Lungs are clear without infiltrate or effusion.  No mass lesion.  Negative for heart failure.  Aortic calcification.  IMPRESSION: Chronic lung disease.  No acute abnormality.   Original Report Authenticated By: Janeece Riggers, M.D.     Assessment and Plan: I agree with the formulated Assessment and Plan with the following changes: 89 yr. Old WF w/ hx of HTN, HL, PTSD?, presented with CP. -History is concerning for cardiac origin given worsening of pain with exertion. We must also exclude PE at this time given her intermediate score. Agrees to CTA today and possibly cardiology consult. We will discuss later today possible discharge if not significant findings. -rest per resident note.   The treatment plan was discussed in detail with the patient.  Alternatives to treatment, side effects, risks and benefits, and complications were discussed with the patient. Informed consent was obtained. The patient agrees to proceed with the current treatment plan.  Jonah Blue, DO 5/17/201410:36 AM

## 2013-08-03 ENCOUNTER — Emergency Department (HOSPITAL_COMMUNITY)
Admission: EM | Admit: 2013-08-03 | Discharge: 2013-08-03 | Disposition: A | Discharge status: Home / Self Care | Payer: Medicare Other | Attending: Emergency Medicine | Admitting: Emergency Medicine

## 2013-08-03 ENCOUNTER — Encounter (HOSPITAL_COMMUNITY): Payer: Self-pay

## 2013-08-03 ENCOUNTER — Emergency Department (HOSPITAL_COMMUNITY): Payer: Medicare Other

## 2013-08-03 DIAGNOSIS — Z79899 Other long term (current) drug therapy: Secondary | ICD-10-CM | POA: Insufficient documentation

## 2013-08-03 DIAGNOSIS — R197 Diarrhea, unspecified: Secondary | ICD-10-CM | POA: Insufficient documentation

## 2013-08-03 DIAGNOSIS — N390 Urinary tract infection, site not specified: Secondary | ICD-10-CM | POA: Insufficient documentation

## 2013-08-03 DIAGNOSIS — F329 Major depressive disorder, single episode, unspecified: Secondary | ICD-10-CM | POA: Insufficient documentation

## 2013-08-03 DIAGNOSIS — Z8669 Personal history of other diseases of the nervous system and sense organs: Secondary | ICD-10-CM | POA: Insufficient documentation

## 2013-08-03 DIAGNOSIS — E039 Hypothyroidism, unspecified: Secondary | ICD-10-CM | POA: Insufficient documentation

## 2013-08-03 DIAGNOSIS — F3289 Other specified depressive episodes: Secondary | ICD-10-CM | POA: Insufficient documentation

## 2013-08-03 DIAGNOSIS — I10 Essential (primary) hypertension: Secondary | ICD-10-CM | POA: Insufficient documentation

## 2013-08-03 DIAGNOSIS — E785 Hyperlipidemia, unspecified: Secondary | ICD-10-CM | POA: Insufficient documentation

## 2013-08-03 DIAGNOSIS — Z8709 Personal history of other diseases of the respiratory system: Secondary | ICD-10-CM | POA: Insufficient documentation

## 2013-08-03 DIAGNOSIS — Z7982 Long term (current) use of aspirin: Secondary | ICD-10-CM | POA: Insufficient documentation

## 2013-08-03 DIAGNOSIS — M129 Arthropathy, unspecified: Secondary | ICD-10-CM | POA: Insufficient documentation

## 2013-08-03 DIAGNOSIS — K219 Gastro-esophageal reflux disease without esophagitis: Secondary | ICD-10-CM | POA: Insufficient documentation

## 2013-08-03 LAB — CBC WITH DIFFERENTIAL/PLATELET
Basophils Absolute: 0.1 10*3/uL (ref 0.0–0.1)
Basophils Relative: 1 % (ref 0–1)
HCT: 36.8 % (ref 36.0–46.0)
Hemoglobin: 12.4 g/dL (ref 12.0–15.0)
Lymphocytes Relative: 9 % — ABNORMAL LOW (ref 12–46)
Monocytes Absolute: 0.5 10*3/uL (ref 0.1–1.0)
Neutro Abs: 4.7 10*3/uL (ref 1.7–7.7)
Neutrophils Relative %: 79 % — ABNORMAL HIGH (ref 43–77)
RDW: 13.7 % (ref 11.5–15.5)
WBC: 6 10*3/uL (ref 4.0–10.5)

## 2013-08-03 LAB — COMPREHENSIVE METABOLIC PANEL
ALT: 49 U/L — ABNORMAL HIGH (ref 0–35)
AST: 39 U/L — ABNORMAL HIGH (ref 0–37)
Alkaline Phosphatase: 131 U/L — ABNORMAL HIGH (ref 39–117)
CO2: 25 mEq/L (ref 19–32)
Chloride: 106 mEq/L (ref 96–112)
GFR calc non Af Amer: 57 mL/min — ABNORMAL LOW (ref >90)
Sodium: 140 mEq/L (ref 135–145)
Total Bilirubin: 0.7 mg/dL (ref 0.3–1.2)

## 2013-08-03 LAB — URINALYSIS, ROUTINE W REFLEX MICROSCOPIC
Glucose, UA: NEGATIVE mg/dL
Hgb urine dipstick: NEGATIVE
Specific Gravity, Urine: 1.027 (ref 1.005–1.030)

## 2013-08-03 LAB — URINE MICROSCOPIC-ADD ON

## 2013-08-03 MED ORDER — ESOMEPRAZOLE MAGNESIUM 40 MG PO CPDR: DELAYED_RELEASE_CAPSULE | ORAL | Status: DC

## 2013-08-03 MED ORDER — GI COCKTAIL ~~LOC~~
30.0000 mL | Freq: Once | ORAL | Status: AC
  Administered 2013-08-03: 30 mL via ORAL
  Filled 2013-08-03: qty 30

## 2013-08-03 MED ORDER — CEPHALEXIN 500 MG PO CAPS
500.0000 mg | ORAL_CAPSULE | Freq: Once | ORAL | Status: AC
  Administered 2013-08-03: 500 mg via ORAL
  Filled 2013-08-03: qty 1

## 2013-08-03 MED ORDER — FAMOTIDINE 20 MG PO TABS
20.0000 mg | ORAL_TABLET | Freq: Once | ORAL | Status: AC
  Administered 2013-08-03: 20 mg via ORAL
  Filled 2013-08-03: qty 1

## 2013-08-03 MED ORDER — CEPHALEXIN 500 MG PO CAPS: 500.0000 mg | ORAL_CAPSULE | Freq: Three times a day (TID) | ORAL | Status: DC

## 2013-08-03 NOTE — ED Notes (Signed)
Patient refused food at this time but wanted apple juice.

## 2013-08-03 NOTE — ED Notes (Addendum)
PER EMS pt from [facility]. Called 911 for the chest burning sensation that has been going on for 3 days. Taking mylanta which relieves it for short periods. Thursday also running fever, having diarrhea, decreased apetitte. Received mylanta before EMS arrived which relieved burning.

## 2013-08-03 NOTE — ED Notes (Addendum)
Patient received 324mg  ASA, 1000mg  of Tylenol PO. NS bolus. cbg 111. Fever 101.5 po

## 2013-08-03 NOTE — ED Notes (Signed)
Patient states that she just recently moved to a living facility. It has been difficult to adjust to the food and watching older people eat. She also has concerns about some property that she owns that may have chemical on it causing her possibly not being able to lease/rent it out. She states that she worries about that.

## 2013-08-03 NOTE — ED Provider Notes (Signed)
CSN: [id number]     Arrival date & time 08/03/13  9528 History     First MD Initiated Contact with Patient 08/03/13 [id number]     Chief Complaint  Patient presents with  . Chest Pain  . Gastrophageal Reflux   (Consider location/radiation/quality/duration/timing/severity/associated sxs/prior Treatment) HPI  Patient reports she has been on Prilosec and other stomach acid medications for years, most recently Prilosec. She states the last few weeks she's been noticing she is getting minor burning in her lower chest. She states 2 days ago in the morning she had a temperature of 101.3 and started getting persistent burning in her lower chest. She denies reflux coming up into her throat. She reports by noon her temp is 99.4. Yesterday the following day she felt okay however when she went to dinner last night and about 30 minutes after eating macaroni and cheese, fried okra, and apples she started having the lower chest burning again. She states it felt a little bit better when she drank cold tea. She was given Mylanta about 12:30 last night and was finally able to fall asleep. She states sometimes when she takes a big deep breath she gets a pain in the same area and she states sometimes the pain hurts when she moves a certain way. She reports she has no energy. She states she does not have black stools. She states she did have diarrhea recently however she took probiotics and that helped however yesterday she did have 5 episodes of watery diarrhea. She states that since she moved to independent living from her private home she has lost weight and has lost appetite because she does not like the food that is served.  She states she wants all the testing to be done today to find out what's wrong with her including endoscopy and colonoscopy. She feels like she is having an ulcer.   PCP Dr Hildred Alamin GI Dr Renelda Mom last colonscopy was in 2006  Past Medical History  Diagnosis Date  . Complication of anesthesia   .  PONV (postoperative nausea and vomiting)   . Hypertension   . Hyperlipidemia   . Swelling of extremity     sees Dr. Allyson Sabal, cardiologist 1 x year [phone]  . Cataract     bilaterally  . Bronchitis     Jan 2013  . Hypothyroidism     takes synthroid  . GERD (gastroesophageal reflux disease)   . Neuromuscular disorder     carpal tunnel in right hand  . Arthritis   . Depression     2005, was on medication at that time  . Insomnia   . Hyperlipidemia    Past Surgical History  Procedure Laterality Date  . Knee arthroplasty      right knee in 2005  . Carpal tunnel release      right side  . Eye surgery      bilateral cataract surgery 2000  . Breast biopsy      left  . Cardiovascular stress test      done 12/07/11  . Doppler echocardiography      12/07/11  . Total knee arthroplasty  03/02/2012    Procedure: TOTAL KNEE ARTHROPLASTY;  Surgeon: Thera Flake., MD;  Location: [facility] OR;  Service: Orthopedics;  Laterality: Left;  . Joint replacement      bilateral knee replacement   No family history on file. History  Substance Use Topics  . Smoking status: Never Smoker   . Smokeless tobacco: Not  on file  . Alcohol Use: No  lives in independent living since Feb   OB History   Grav Para Term Preterm Abortions TAB SAB Ect Mult Living                 Review of Systems  All other systems reviewed and are negative.    Allergies  Morphine and related  Home Medications   Current Outpatient Rx  Name  Route  Sig  Dispense  Refill  . ALPRAZolam (XANAX) 0.25 MG tablet   Oral   Take 0.125 mg by mouth at bedtime. For sleep.         Marland Kitchen amLODipine (NORVASC) 10 MG tablet   Oral   Take 10 mg by mouth daily.          Marland Kitchen aspirin EC 81 MG tablet   Oral   Take 81 mg by mouth daily.         Marland Kitchen atorvastatin (LIPITOR) 40 MG tablet   Oral   Take 40 mg by mouth daily.          . benazepril (LOTENSIN) 40 MG tablet   Oral   Take 40 mg by mouth daily.           .  Flaxseed, Linseed, (FLAXSEED OIL PO)   Oral   Take 1 tablet by mouth daily.          . furosemide (LASIX) 40 MG tablet   Oral   Take 60 mg by mouth daily.          Marland Kitchen ibuprofen (ADVIL,MOTRIN) 200 MG tablet   Oral   Take 400 mg by mouth every 4 (four) hours as needed for pain.         Marland Kitchen levothyroxine (SYNTHROID, LEVOTHROID) 100 MCG tablet   Oral   Take 100 mcg by mouth daily.          . metoprolol (LOPRESSOR) 50 MG tablet   Oral   Take 50 mg by mouth 2 (two) times daily.         . Multiple Vitamins-Minerals (MACULAR VITAMIN BENEFIT PO)   Oral   Take 1 tablet by mouth 4 (four) times daily.          Marland Kitchen omeprazole (PRILOSEC) 10 MG capsule   Oral   Take 10 mg by mouth daily.          . potassium chloride SA (K-DUR,KLOR-CON) 20 MEQ tablet   Oral   Take 20 mEq by mouth daily.           BP 103/56  Pulse 72  Temp(Src) 99.4 F (37.4 C) (Oral)  Resp 15  SpO2 94%  Vital signs normal   Physical Exam  Nursing note and vitals reviewed. Constitutional: She is oriented to person, place, and time.  Non-toxic appearance. She does not appear ill. No distress.  Elderly frail female  HENT:  Head: Normocephalic and atraumatic.  Right Ear: External ear normal.  Left Ear: External ear normal.  Nose: Nose normal. No mucosal edema or rhinorrhea.  Mouth/Throat: Oropharynx is clear and moist and mucous membranes are normal. No dental abscesses or edematous.  Eyes: Conjunctivae and EOM are normal. Pupils are equal, round, and reactive to light.  Neck: Normal range of motion and full passive range of motion without pain. Neck supple.  Cardiovascular: Normal rate, regular rhythm and normal heart sounds.  Exam reveals no gallop and no friction rub.   No murmur heard. Pulmonary/Chest: Effort normal and  breath sounds normal. No respiratory distress. She has no wheezes. She has no rhonchi. She has no rales. She exhibits no tenderness and no crepitus.    Area of pain noted    Abdominal: Soft. Normal appearance and bowel sounds are normal. She exhibits no distension. There is no tenderness. There is no rebound and no guarding.  Musculoskeletal: Normal range of motion. She exhibits no edema and no tenderness.  Moves all extremities well.   Neurological: She is alert and oriented to person, place, and time. She has normal strength. No cranial nerve deficit.  Skin: Skin is warm, dry and intact. No rash noted. No erythema. No pallor.  Psychiatric: She has a normal mood and affect. Her speech is normal and behavior is normal. Her mood appears not anxious.    ED Course    Medications  cephALEXin (KEFLEX) capsule 500 mg (not administered)  gi cocktail (Maalox,Lidocaine,Donnatal) (30 mLs Oral Given 08/03/13 1026)  famotidine (PEPCID) tablet 20 mg (20 mg Oral Given 08/03/13 1026)       Procedures (including critical care time)  Review of her records show she was admitted to the hospital in May for chest pain and had a cardiac violation that was normal at that time. She also had a CT in June of chest on May 17 that showed no pulmonary embolus and basically no acute changes.   10:45 pt informed of need for 2 view Chest Xray--son here, states patient has some SOB when walking up a small hill from the garden to his house when she was visiting him and taking care of her beans.  11:37 D/W Dr Tyron Russell about her Chest Xray, pt had CT angio chest in May, he reviewed her chest CT and recommends repeat CXR in a few months instead of chest CT today.   ` Discharge although patient states she has been on Prilosec for many years she also states she was on Protonix recently and it made her have diarrhea. She states she feels like the Prilosec is not working anymore. She has been at [facility] in the past and wants to go back to be seen. She states her pain is gone.   Results for orders placed during the hospital encounter of 08/03/13  CBC WITH DIFFERENTIAL      Result Value Range   WBC  6.0  4.0 - 10.5 K/uL   RBC 4.06  3.87 - 5.11 MIL/uL   Hemoglobin 12.4  12.0 - 15.0 g/dL   HCT 78.2  95.6 - 21.3 %   MCV 90.6  78.0 - 100.0 fL   MCH 30.5  26.0 - 34.0 pg   MCHC 33.7  30.0 - 36.0 g/dL   RDW 08.6  57.8 - 46.9 %   Platelets 143 (*) 150 - 400 K/uL   Neutrophils Relative % 79 (*) 43 - 77 %   Neutro Abs 4.7  1.7 - 7.7 K/uL   Lymphocytes Relative 9 (*) 12 - 46 %   Lymphs Abs 0.6 (*) 0.7 - 4.0 K/uL   Monocytes Relative 9  3 - 12 %   Monocytes Absolute 0.5  0.1 - 1.0 K/uL   Eosinophils Relative 3  0 - 5 %   Eosinophils Absolute 0.2  0.0 - 0.7 K/uL   Basophils Relative 1  0 - 1 %   Basophils Absolute 0.1  0.0 - 0.1 K/uL  COMPREHENSIVE METABOLIC PANEL      Result Value Range   Sodium 140  135 - 145  mEq/L   Potassium 3.5  3.5 - 5.1 mEq/L   Chloride 106  96 - 112 mEq/L   CO2 25  19 - 32 mEq/L   Glucose, Bld 134 (*) 70 - 99 mg/dL   BUN 12  6 - 23 mg/dL   Creatinine, Ser 4.09  0.50 - 1.10 mg/dL   Calcium 8.8  8.4 - 81.1 mg/dL   Total Protein 5.8 (*) 6.0 - 8.3 g/dL   Albumin 2.9 (*) 3.5 - 5.2 g/dL   AST 39 (*) 0 - 37 U/L   ALT 49 (*) 0 - 35 U/L   Alkaline Phosphatase 131 (*) 39 - 117 U/L   Total Bilirubin 0.7  0.3 - 1.2 mg/dL   GFR calc non Af Amer 57 (*) >90 mL/min   GFR calc Af Amer 66 (*) >90 mL/min  MAGNESIUM      Result Value Range   Magnesium 1.5  1.5 - 2.5 mg/dL  TROPONIN I      Result Value Range   Troponin I <0.30  <0.30 ng/mL  URINALYSIS, ROUTINE W REFLEX MICROSCOPIC      Result Value Range   Color, Urine YELLOW  YELLOW   APPearance CLEAR  CLEAR   Specific Gravity, Urine 1.027  1.005 - 1.030   pH 5.0  5.0 - 8.0   Glucose, UA NEGATIVE  NEGATIVE mg/dL   Hgb urine dipstick NEGATIVE  NEGATIVE   Bilirubin Urine NEGATIVE  NEGATIVE   Ketones, ur NEGATIVE  NEGATIVE mg/dL   Protein, ur NEGATIVE  NEGATIVE mg/dL   Urobilinogen, UA 0.2  0.0 - 1.0 mg/dL   Nitrite NEGATIVE  NEGATIVE   Leukocytes, UA SMALL (*) NEGATIVE  URINE MICROSCOPIC-ADD ON      Result Value  Range   Squamous Epithelial / LPF RARE  RARE   WBC, UA 3-6  <3 WBC/hpf   Bacteria, UA RARE  RARE   Laboratory interpretation all normal except  Possible uti   Dg Chest 2 View  08/03/2013   *RADIOLOGY REPORT*  Clinical Data: Epigastric and chest pain, gastroesophageal reflux, abnormal portable chest radiograph  CHEST - 2 VIEW  Comparison: Preceding 08/03/2013 portable chest radiograph at 1029 hours  Findings: Normal heart size. Calcified tortuous aorta. Hila appear less prominent in size versus preceding portable chest radiograph, question prominence of the central pulmonary arteries versus mild adenopathy. Lungs clear. No pleural effusion or pneumothorax. Bones demineralized.  IMPRESSION: Emphysematous changes consistent with COPD. Mild hilar prominence bilaterally could reflect prominent central pulmonary arteries or adenopathy particularly on the left; recommend CT with contrast to exclude left hilar adenopathy.   Original Report Authenticated By: Ulyses Southward, M.D.   Dg Chest Portable 1 View  08/03/2013   *RADIOLOGY REPORT*  Clinical Data: Epigastric and chest pain  PORTABLE CHEST - 1 VIEW  Comparison: 04/26/2013  Findings: New right hilar enlargement suggesting adenopathy. Patchy right perihilar airspace opacities.  Heart size upper limits normal.  Atheromatous aortic arch.  Left lung clear.  No effusion.  IMPRESSION:  Right perihilar airspace infiltrates with possible right hilar adenopathy.  Consider CT chest versus a standard two-view CXR exam once the  patient is stable for further evaluation.   Original Report Authenticated By: D. Andria Rhein, MD    Date: 08/03/2013  Rate: 78  Rhythm: normal sinus rhythm, PAC's  QRS Axis: normal  Intervals: normal  ST/T Wave abnormalities: normal  Conduction Disutrbances:none  Narrative Interpretation:   Old EKG Reviewed: unchanged from 04/27/2013    1. GERD (  gastroesophageal reflux disease)   2. UTI (urinary tract infection)      New  Prescriptions   CEPHALEXIN (KEFLEX) 500 MG CAPSULE    Take 1 capsule (500 mg total) by mouth 3 (three) times daily.   ESOMEPRAZOLE (NEXIUM) 40 MG CAPSULE    Take 1 po BID x 2 weeks then once a day    Plan discharge   Devoria Albe, MD, FACEP    MDM    Ward Givens, MD 08/03/13 [id number]

## 2013-08-13 ENCOUNTER — Observation Stay (HOSPITAL_COMMUNITY)
Admission: AD | Admit: 2013-08-13 | Discharge: 2013-08-14 | Disposition: A | Discharge status: Home / Self Care | Payer: Medicare Other | Source: Ambulatory Visit | Attending: Cardiovascular Disease | Admitting: Cardiovascular Disease

## 2013-08-13 ENCOUNTER — Ambulatory Visit (INDEPENDENT_AMBULATORY_CARE_PROVIDER_SITE_OTHER): Payer: Medicare Other | Admitting: Physician Assistant

## 2013-08-13 ENCOUNTER — Telehealth: Payer: Self-pay | Admitting: Cardiovascular Disease

## 2013-08-13 ENCOUNTER — Encounter: Payer: Self-pay | Admitting: Physician Assistant

## 2013-08-13 ENCOUNTER — Encounter (HOSPITAL_COMMUNITY): Payer: Self-pay | Admitting: General Practice

## 2013-08-13 VITALS — BP 102/64 | HR 137 | Ht 63.0 in | Wt 120.5 lb

## 2013-08-13 DIAGNOSIS — K219 Gastro-esophageal reflux disease without esophagitis: Secondary | ICD-10-CM | POA: Insufficient documentation

## 2013-08-13 DIAGNOSIS — R0789 Other chest pain: Secondary | ICD-10-CM

## 2013-08-13 DIAGNOSIS — E785 Hyperlipidemia, unspecified: Secondary | ICD-10-CM | POA: Insufficient documentation

## 2013-08-13 DIAGNOSIS — I4891 Unspecified atrial fibrillation: Secondary | ICD-10-CM

## 2013-08-13 DIAGNOSIS — I10 Essential (primary) hypertension: Secondary | ICD-10-CM | POA: Insufficient documentation

## 2013-08-13 HISTORY — DX: Personal history of other medical treatment: Z92.89

## 2013-08-13 HISTORY — DX: Unspecified malignant neoplasm of skin of unspecified part of face: C44.300

## 2013-08-13 HISTORY — DX: Personal history of other diseases of the digestive system: Z87.19

## 2013-08-13 HISTORY — DX: Unspecified atrial fibrillation: I48.91

## 2013-08-13 HISTORY — DX: Type 2 diabetes mellitus without complications: E11.9

## 2013-08-13 HISTORY — DX: Panic disorder (episodic paroxysmal anxiety): F41.0

## 2013-08-13 LAB — CBC
HCT: 35.7 % — ABNORMAL LOW (ref 36.0–46.0)
Platelets: 321 10*3/uL (ref 150–400)
RBC: 4.04 MIL/uL (ref 3.87–5.11)
RDW: 14.7 % (ref 11.5–15.5)
WBC: 8.4 10*3/uL (ref 4.0–10.5)

## 2013-08-13 LAB — BASIC METABOLIC PANEL
CO2: 24 mEq/L (ref 19–32)
Calcium: 9.2 mg/dL (ref 8.4–10.5)
Chloride: 103 mEq/L (ref 96–112)
Creatinine, Ser: 1.09 mg/dL (ref 0.50–1.10)
GFR calc Af Amer: 51 mL/min — ABNORMAL LOW (ref >90)
Sodium: 139 mEq/L (ref 135–145)

## 2013-08-13 MED ORDER — DILTIAZEM LOAD VIA INFUSION
10.0000 mg | Freq: Once | INTRAVENOUS | Status: AC
  Administered 2013-08-13: 10 mg via INTRAVENOUS
  Filled 2013-08-13: qty 10

## 2013-08-13 MED ORDER — ONDANSETRON HCL 4 MG/2ML IJ SOLN: 4.0000 mg | Freq: Four times a day (QID) | INTRAMUSCULAR | Status: DC | PRN

## 2013-08-13 MED ORDER — TEMAZEPAM 15 MG PO CAPS: 30.0000 mg | ORAL_CAPSULE | Freq: Every evening | ORAL | Status: DC | PRN

## 2013-08-13 MED ORDER — ZOLPIDEM TARTRATE 5 MG PO TABS: 5.0000 mg | ORAL_TABLET | Freq: Every evening | ORAL | Status: DC | PRN

## 2013-08-13 MED ORDER — ASPIRIN EC 81 MG PO TBEC
81.0000 mg | DELAYED_RELEASE_TABLET | Freq: Every day | ORAL | Status: DC
  Administered 2013-08-13 – 2013-08-14 (×2): 81 mg via ORAL
  Filled 2013-08-13 (×2): qty 1

## 2013-08-13 MED ORDER — TEMAZEPAM 15 MG PO CAPS
30.0000 mg | ORAL_CAPSULE | Freq: Every evening | ORAL | Status: DC | PRN
  Administered 2013-08-13: 30 mg via ORAL
  Filled 2013-08-13: qty 2

## 2013-08-13 MED ORDER — METOPROLOL TARTRATE 50 MG PO TABS
50.0000 mg | ORAL_TABLET | Freq: Two times a day (BID) | ORAL | Status: DC
  Administered 2013-08-13 – 2013-08-14 (×2): 50 mg via ORAL
  Filled 2013-08-13 (×4): qty 1

## 2013-08-13 MED ORDER — APIXABAN 2.5 MG PO TABS
2.5000 mg | ORAL_TABLET | Freq: Two times a day (BID) | ORAL | Status: DC
  Administered 2013-08-13 – 2013-08-14 (×2): 2.5 mg via ORAL
  Filled 2013-08-13 (×4): qty 1

## 2013-08-13 MED ORDER — BENAZEPRIL HCL 40 MG PO TABS
40.0000 mg | ORAL_TABLET | Freq: Every day | ORAL | Status: DC
  Administered 2013-08-14: 40 mg via ORAL
  Filled 2013-08-13 (×2): qty 1

## 2013-08-13 MED ORDER — ATORVASTATIN CALCIUM 80 MG PO TABS
80.0000 mg | ORAL_TABLET | Freq: Every day | ORAL | Status: DC
  Administered 2013-08-14: 80 mg via ORAL
  Filled 2013-08-13 (×2): qty 1

## 2013-08-13 MED ORDER — LEVOTHYROXINE SODIUM 100 MCG PO TABS
100.0000 ug | ORAL_TABLET | Freq: Every day | ORAL | Status: DC
Start: 2013-08-14 — End: 2013-08-14
  Administered 2013-08-14: 100 ug via ORAL
  Filled 2013-08-13 (×2): qty 1

## 2013-08-13 MED ORDER — ACETAMINOPHEN 325 MG PO TABS: 650.0000 mg | ORAL_TABLET | ORAL | Status: DC | PRN

## 2013-08-13 MED ORDER — FLUOXETINE HCL 10 MG PO CAPS
10.0000 mg | ORAL_CAPSULE | Freq: Every day | ORAL | Status: DC
  Administered 2013-08-14: 10 mg via ORAL
  Filled 2013-08-13 (×2): qty 1

## 2013-08-13 MED ORDER — PANTOPRAZOLE SODIUM 40 MG PO TBEC
40.0000 mg | DELAYED_RELEASE_TABLET | Freq: Every day | ORAL | Status: DC
  Administered 2013-08-13 – 2013-08-14 (×2): 40 mg via ORAL
  Filled 2013-08-13 (×2): qty 1

## 2013-08-13 MED ORDER — DILTIAZEM HCL 100 MG IV SOLR
5.0000 mg/h | INTRAVENOUS | Status: DC
  Administered 2013-08-13: 15 mg/h via INTRAVENOUS
  Administered 2013-08-13: 5 mg/h via INTRAVENOUS
  Filled 2013-08-13: qty 100

## 2013-08-13 NOTE — Patient Instructions (Signed)
Go directly to [facility] for admission.

## 2013-08-13 NOTE — Progress Notes (Signed)
The patient has arrived from [facility] to [facility] for admission for treatment of new onset atrial fibrillation w/ RVR. Pt was seen by Wilburt Finlay, PA-C, earlier today. Full H&P is pending.  She is roomed and stable. Her only complaint currently is fatigue. She denies CP, SOB, dizziness and syncope/near syncope   Will place on telemetry and IV Cardizem for rate control. Full admission orders will be completed by Wilburt Finlay.  Allayne Butcher, PA-C [facility]

## 2013-08-13 NOTE — Progress Notes (Signed)
When pt arrived, hr in the 150's. I initiated diltizem gtt at a rate of 5. After 30 minutes, hr didn't improve but to 130s so I increased gtt rate to 10. BP at that time 124/78. No orders for bolus due to blood pressure being on the lower end of normal on admission. I paged Nada Boozer to see if any change in plan. Pt asymptomatic. Will continue to monitor

## 2013-08-13 NOTE — H&P (Signed)
Date:  08/13/2013   ID:  Leah Holland, DOB September 07, 1922, MRN [id number]  PCP:  Herb Grays, MD  Primary Cardiologist:  Allyson Sabal     History of Present Illness: Leah Holland is a 79 y.o. Leah Holland is a 79 y.o. widowed, Caucasian female, mother of two living children (one deceased), grandmother to five grandchildren, who was last seen by Dr. Allyson Sabal on 07/30/12. She has a history of hypertension and hyperlipidemia, as well as family history of heart disease.  She had a remote right total knee replacement and a recent left one by Dr. Madelon Lips. She had a Myoview stress test performed in our office on August 07, 2011, which was low risk. Recent lab work performed by Dr. Yehuda Budd revealed a total cholesterol of 133, HDL of 71, and HDL of 39.   Patient presents today with apparent new onset atrial fibrillation with RVR.  She was just seen at [facility] on 08/03/2013 for chest pain.  She was diagnosed with gastroesophageal reflux disease and a UTI.  At that time she was in normal sinus rhythm at a rate of 79 beats per minute and with PACs.   She reports dizziness, feeling tired and a racing HR since ~Sat/Sun.  She otherwise denies nausea, vomiting, fever, chest pain, dizziness, PND, cough, congestion, abdominal pain, hematochezia, melena, lower extremity edema, claudication.  No recent GIB.    Wt Readings from Last 3 Encounters:  08/13/13 120 lb 8 oz (54.658 kg)  04/27/13 124 lb 14.4 oz (56.654 kg)  02/23/12 142 lb 14.4 oz (64.819 kg)    Family History  Problem Relation Age of Onset  . Arrhythmia Mother   . Arrhythmia Sister   . Arrhythmia Brother        Past Medical History  Diagnosis Date  . Complication of anesthesia   . PONV (postoperative nausea and vomiting)   . Hypertension   . Hyperlipidemia   . Swelling of extremity     sees Dr. Allyson Sabal, cardiologist 1 x year [phone]  . Cataract     bilaterally  . Bronchitis     Jan 2013  . Hypothyroidism     takes  synthroid  . GERD (gastroesophageal reflux disease)   . Neuromuscular disorder     carpal tunnel in right hand  . Arthritis   . Depression     2005, was on medication at that time  . Insomnia   . Hyperlipidemia     No current outpatient prescriptions on file.   No current facility-administered medications for this visit.    Allergies:    Allergies  Allergen Reactions  . Morphine And Related Other (See Comments)    Made pt "wild"    Social History:  The patient  reports that she has never smoked. She does not have any smokeless tobacco history on file. She reports that she does not drink alcohol or use illicit drugs.   Family history:   Family History  Problem Relation Age of Onset  . Arrhythmia Mother   . Arrhythmia Sister   . Arrhythmia Brother     ROS:  Please see the history of present illness.  All other systems reviewed and negative.   PHYSICAL EXAM: VS:  BP 102/64  Pulse 137  Ht 5\' 3"  (1.6 m)  Wt 120 lb 8 oz (54.658 kg)  BMI 21.35 kg/m2 Well nourished, well developed, in no acute distress HEENT: Pupils are equal round react to light accommodation extraocular  movements are intact.  Neck: no JVDNo cervical lymphadenopathy. Cardiac: Irregular rate and rhythm without murmurs rubs or gallops. Lungs:  clear to auscultation bilaterally, no wheezing, rhonchi or rales Abd: soft, nontender, positive bowel sounds all quadrants, no hepatosplenomegaly Ext: no lower extremity edema.  2+ radial and dorsalis pedis pulses. Skin: warm and dry Neuro:  Grossly normal  EKG:    Atrial fibrillation with rapid ventricular response 137 beats per minute   ASSESSMENT AND PLAN:  Problem List Items Addressed This Visit   Atrial fibrillation with rapid ventricular response     The patient will be admitted to [facility] to the telemetry unit. We'll start her on IV diltiazem and discontinue amlodipine as well as make other dosage adjustments to her other hypertension  medicine. We'll also start her on Eliquis for stroke prophylaxis.  Hopefully she will convert on her own otherwise, we'll need to consider TEE/DCCv the     Other Visit Diagnoses   Atrial fibrillation with RVR    -  Primary      HAGER, BRYAN 4:29 PM  I have seen and evaluated the patient this PM along with Wilburt Finlay, PA. I agree with his findings, examination as well as impression recommendations.  Very pleasant elderly lady who now presents with a new Dx of Afib - with RVR.  She is quite symptomatic & has probably been in the rhythm for ~ 48 hrs + based upon onset of symptoms.  Based upon her symptoms & age, I agree that the best plan is to admit for rate control with IV CCB, d/c Amlodipine & initiate anticoagulation with Eliquis for CVA prophylaxis.  I did spent at least 15 minutes in direct consultation with the patient & her niece explaining Afib, the Rx - rate vs. Rhythm control & CVA prophylaxis.  I explained that Eliquis is quite likely the safest option for anticoagulation.    MD Time with pt: 15 min  Michoel Kunin W, M.D., M.S. [facility] [address] [place], [place]  [address]  [phone] Pager # [phone] 08/13/2013 6:01 PM

## 2013-08-13 NOTE — Progress Notes (Signed)
Date:  08/13/2013   ID:  Leah Holland, DOB November 22, 1922, MRN [id number]  PCP:  Herb Grays, MD  Primary Cardiologist:  Allyson Sabal     History of Present Illness: Leah Holland is a 79 y.o. Leah Holland is a 79 y.o. widowed, Caucasian female, mother of two living children (one deceased), grandmother to five grandchildren, who was last seen by Dr. Allyson Sabal on 07/30/12. She has a history of hypertension and hyperlipidemia, as well as family history of heart disease.  She had a remote right total knee replacement and a recent left one by Dr. Madelon Lips. She had a Myoview stress test performed in our office on August 07, 2011, which was low risk. Recent lab work performed by Dr. Yehuda Budd revealed a total cholesterol of 133, HDL of 71, and HDL of 39.   Patient presents today with apparent new onset atrial fibrillation with RVR.  She was just seen at [facility] on 08/03/2013 for chest pain.  She was diagnosed with gastroesophageal reflux disease and a UTI.  At that time she was in normal sinus rhythm at a rate of 79 beats per minute and with PACs.   She reports dizziness, feeling tired and a racing HR since ~Sat/Sun.  She otherwise denies nausea, vomiting, fever, chest pain, dizziness, PND, cough, congestion, abdominal pain, hematochezia, melena, lower extremity edema, claudication.  No recent GIB.    Wt Readings from Last 3 Encounters:  08/13/13 120 lb 8 oz (54.658 kg)  04/27/13 124 lb 14.4 oz (56.654 kg)  02/23/12 142 lb 14.4 oz (64.819 kg)    Family History  Problem Relation Age of Onset  . Arrhythmia Mother   . Arrhythmia Sister   . Arrhythmia Brother        Past Medical History  Diagnosis Date  . Complication of anesthesia   . PONV (postoperative nausea and vomiting)   . Hypertension   . Hyperlipidemia   . Swelling of extremity     sees Dr. Allyson Sabal, cardiologist 1 x year [phone]  . Cataract     bilaterally  . Bronchitis     Jan 2013  . Hypothyroidism     takes  synthroid  . GERD (gastroesophageal reflux disease)   . Neuromuscular disorder     carpal tunnel in right hand  . Arthritis   . Depression     2005, was on medication at that time  . Insomnia   . Hyperlipidemia     No current outpatient prescriptions on file.   No current facility-administered medications for this visit.    Allergies:    Allergies  Allergen Reactions  . Morphine And Related Other (See Comments)    Made pt "wild"    Social History:  The patient  reports that she has never smoked. She does not have any smokeless tobacco history on file. She reports that she does not drink alcohol or use illicit drugs.   Family history:   Family History  Problem Relation Age of Onset  . Arrhythmia Mother   . Arrhythmia Sister   . Arrhythmia Brother     ROS:  Please see the history of present illness.  All other systems reviewed and negative.   PHYSICAL EXAM: VS:  BP 102/64  Pulse 137  Ht 5\' 3"  (1.6 m)  Wt 120 lb 8 oz (54.658 kg)  BMI 21.35 kg/m2 Well nourished, well developed, in no acute distress HEENT: Pupils are equal round react to light accommodation extraocular movements  are intact.  Neck: no JVDNo cervical lymphadenopathy. Cardiac: Irregular rate and rhythm without murmurs rubs or gallops. Lungs:  clear to auscultation bilaterally, no wheezing, rhonchi or rales Abd: soft, nontender, positive bowel sounds all quadrants, no hepatosplenomegaly Ext: no lower extremity edema.  2+ radial and dorsalis pedis pulses. Skin: warm and dry Neuro:  Grossly normal  EKG:    Atrial fibrillation with rapid ventricular response 137 beats per minute   ASSESSMENT AND PLAN:  Problem List Items Addressed This Visit   Atrial fibrillation with rapid ventricular response     The patient will be admitted to [facility] to the telemetry unit. We'll start her on IV diltiazem and discontinue amlodipine as well as make other dosage adjustments to her other hypertension  medicine. We'll also start her on Eliquis for stroke prophylaxis.  Hopefully she will convert on her own otherwise, we'll need to consider TEE/DCCv the     Other Visit Diagnoses   Atrial fibrillation with RVR    -  Primary

## 2013-08-13 NOTE — Assessment & Plan Note (Addendum)
The patient will be admitted to [facility] to the telemetry unit. We'll start her on IV diltiazem and discontinue amlodipine as well as make other dosage adjustments to her other hypertension medicine. We'll also start her on Eliquis for stroke prophylaxis.  Hopefully she will convert on her own otherwise, we'll need to consider TEE/DCCv the

## 2013-08-13 NOTE — Telephone Encounter (Signed)
Appointment requested today for new onset a-fib. Patient will see Judie Grieve today for evaluation.

## 2013-08-14 ENCOUNTER — Telehealth: Payer: Self-pay | Admitting: *Deleted

## 2013-08-14 DIAGNOSIS — I10 Essential (primary) hypertension: Secondary | ICD-10-CM

## 2013-08-14 LAB — BASIC METABOLIC PANEL
CO2: 27 mEq/L (ref 19–32)
Chloride: 103 mEq/L (ref 96–112)
GFR calc Af Amer: 50 mL/min — ABNORMAL LOW (ref >90)
Potassium: 3.8 mEq/L (ref 3.5–5.1)

## 2013-08-14 MED ORDER — APIXABAN 2.5 MG PO TABS: 2.5000 mg | ORAL_TABLET | Freq: Two times a day (BID) | ORAL | Status: DC

## 2013-08-14 MED ORDER — DILTIAZEM HCL ER COATED BEADS 120 MG PO CP24: 120.0000 mg | ORAL_CAPSULE | Freq: Every day | ORAL | Status: DC

## 2013-08-14 MED ORDER — POTASSIUM CHLORIDE ER 10 MEQ PO TBCR: 10.0000 meq | EXTENDED_RELEASE_TABLET | ORAL | Status: DC | PRN

## 2013-08-14 MED ORDER — DILTIAZEM HCL ER COATED BEADS 120 MG PO CP24
120.0000 mg | ORAL_CAPSULE | Freq: Every day | ORAL | Status: DC
  Administered 2013-08-14: 120 mg via ORAL
  Filled 2013-08-14: qty 1

## 2013-08-14 MED ORDER — FUROSEMIDE 20 MG PO TABS: 20.0000 mg | ORAL_TABLET | ORAL | Status: DC | PRN

## 2013-08-14 NOTE — Telephone Encounter (Signed)
PA for Eliquis 2.5mg  tablets BID approved until 08/14/2014 under Medicare Part D benefit.

## 2013-08-14 NOTE — Discharge Summary (Signed)
Physician Discharge Summary  [name] MRN: [id number] DOB/AGE: February 23, 1922 79 y.o.  Admit date: 08/13/2013 Discharge date: 08/14/2013  Admission Diagnoses:  Atrial fib with RVR  Discharge Diagnoses:  Active Problems:   Atrial fibrillation with rapid ventricular response   Discharged Condition: stable  Hospital Course:  Leah Holland is a 79 y.o. Leah Holland is a 79 y.o. widowed, Caucasian female, mother of two living children (one deceased), grandmother to five grandchildren, who was last seen by Dr. Allyson Sabal on 07/30/12. She has a history of hypertension and hyperlipidemia, as well as family history of heart disease. She had a remote right total knee replacement and a recent left one by Dr. Madelon Lips. She had a Myoview stress test performed in our office on August 07, 2011, which was low risk. Recent lab work performed by Dr. Yehuda Budd revealed a total cholesterol of 133, HDL of 71, and HDL of 39.   Patient presented with apparent new onset atrial fibrillation with RVR. She was just seen at [facility] on 08/03/2013 for chest pain. She was diagnosed with gastroesophageal reflux disease and a UTI. At that time she was in normal sinus rhythm at a rate of 79 beats per minute and with PACs. She reported dizziness, feeling tired and a racing HR since ~Sat/Sun. She otherwise denied nausea, vomiting, fever, chest pain, dizziness, PND, cough, congestion, abdominal pain, hematochezia, melena, lower extremity edema, claudication. No recent GIB.   She was admitted and started on IV diltiazem  5mg /hr and Eliquis 2.5mg  twice daily.  She converted to NSR at 0246hrs.  She was changed to PO Cardizem 120mg  daily.  She's been taking lasix 60mg  daily for LEE for a long time.  This was changed to 20mg  daily as needed a long with as needed potassium. This may be easier on her kidneys.  The patient was seen by Dr. Herbie Baltimore who felt she was stable for DC home.  FU was  arranged.   Consults: None  Significant Diagnostic Studies:  CBC    Component Value Date/Time   WBC 8.4 08/13/2013 2006   RBC 4.04 08/13/2013 2006   HGB 12.4 08/13/2013 2006   HCT 35.7* 08/13/2013 2006   PLT 321 08/13/2013 2006   MCV 88.4 08/13/2013 2006   MCH 30.7 08/13/2013 2006   MCHC 34.7 08/13/2013 2006   RDW 14.7 08/13/2013 2006    BMET    Component Value Date/Time   NA 139 08/14/2013 0537   K 3.8 08/14/2013 0537   CL 103 08/14/2013 0537   CO2 27 08/14/2013 0537   GLUCOSE 107* 08/14/2013 0537   BUN 13 08/14/2013 0537   CREATININE 1.11* 08/14/2013 0537   CALCIUM 9.1 08/14/2013 0537   GFRNONAA 43* 08/14/2013 0537   GFRAA 50* 08/14/2013 0537     Treatments: See above  Discharge Exam: Blood pressure 116/54, pulse 66, temperature 98.5 F (36.9 C), temperature source Oral, resp. rate 18, height 5\' 3"  (1.6 m), weight 120 lb 8 oz (54.658 kg), SpO2 94.00%.   Disposition: 01-Home or Self Care  Discharge Orders   Future Appointments Provider Department Dept Phone   09/05/2013 9:45 AM Runell Gess, MD [facility] [place] [phone]   Future Orders Complete By Expires   Diet - low sodium heart healthy  As directed        Medication List    STOP taking these medications       amLODipine 10 MG tablet  Commonly  known as:  NORVASC      TAKE these medications       apixaban 2.5 MG Tabs tablet  Commonly known as:  ELIQUIS  Take 1 tablet (2.5 mg total) by mouth 2 (two) times daily.     aspirin EC 81 MG tablet  Take 81 mg by mouth daily.     atorvastatin 40 MG tablet  Commonly known as:  LIPITOR  Take 80 mg by mouth every evening.     benazepril 40 MG tablet  Commonly known as:  LOTENSIN  Take 40 mg by mouth every evening.     diltiazem 120 MG 24 hr capsule  Commonly known as:  CARDIZEM CD  Take 1 capsule (120 mg total) by mouth daily.     esomeprazole 40 MG capsule  Commonly known as:  NEXIUM  Take 40 mg by mouth 2 (two) times daily. Take 1 po BID x  2 weeks then once a day     FLAXSEED OIL PO  Take 1 tablet by mouth daily.     FLUoxetine 10 MG capsule  Commonly known as:  PROZAC  Take 10 mg by mouth daily.     furosemide 20 MG tablet  Commonly known as:  LASIX  Take 1 tablet (20 mg total) by mouth as needed for edema.     levofloxacin 500 MG tablet  Commonly known as:  LEVAQUIN  Take 500 mg by mouth daily. Has 2 days left     levothyroxine 100 MCG tablet  Commonly known as:  SYNTHROID, LEVOTHROID  Take 100 mcg by mouth daily.     MACULAR VITAMIN BENEFIT PO  Take 1 tablet by mouth 4 (four) times daily.     metoprolol 50 MG tablet  Commonly known as:  LOPRESSOR  Take 50 mg by mouth 2 (two) times daily.     potassium chloride 10 MEQ tablet  Commonly known as:  K-DUR  Take 1 tablet (10 mEq total) by mouth as needed.     PROBIOTIC Caps  Take 1 capsule by mouth daily.     temazepam 30 MG capsule  Commonly known as:  RESTORIL  Take 30 mg by mouth at bedtime as needed for sleep.     TYLENOL 325 MG tablet  Generic drug:  acetaminophen  Take 650 mg by mouth every 6 (six) hours as needed for pain.           Follow-up Information   Follow up with Runell Gess, MD On 09/05/2013. (9:45 am.  Your appt for 08/16/13 was change to the 25th.)    Specialty:  Cardiology   Contact information:   [address] [place] [address] [phone]       Signed: Wilburt Finlay 08/14/2013, 11:38 AM

## 2013-08-14 NOTE — H&P (Addendum)
I saw & examined the patient @ [facility] along with [name]. Please refer to his Clinic "progress note" -- H&P was done, but currently not in chart.  79 y/o woman ~48 hr into new onset Afib with RVR - profoundly weak & sob.   Exam otherwise benign.  Agree with plan to admit for rate control with hope for spontaneous conversion.   With CHA2DS2Vasc Score - 4, will need long term Anticoagulation.  Safety & efficacy would dictate that Eliquis (low dose) would be best option.  She was very concerned about the potential need for warfarin due to monitoring, dietary & medication interactions.  Leah Lex, MD 08/13/2013 1445

## 2013-08-14 NOTE — Discharge Summary (Signed)
Admitted for o/n obs to Rx Afib RVR - spontaneously converted on IV Diltiazem.   AC with Eliquis.  Feels much better in NSR.  OK for d/c.  I agree with the d/c summary.  Marykay Lex, MD

## 2013-08-14 NOTE — Care Management Note (Addendum)
  Page 1 of 1   08/14/2013     10:12:02 AM   CARE MANAGEMENT NOTE 08/14/2013  Patient:  Leah Holland, Leah Holland   Account Number:  [id number]  Date Initiated:  08/14/2013  Documentation initiated by:  GRAVES-BIGELOW,Jodiann Ognibene  Subjective/Objective Assessment:   Pt admitted for new onset Afib with RVR - profoundly weak & sob. Pt initiated on cardizem gtt andconverting to NSR. Pt planned for d/c on Eliquis.     Action/Plan:   Pt uses Eliquis and she gets her meds from [facility]. Medication is available.   Anticipated DC Date:  08/14/2013   Anticipated DC Plan:  HOME/SELF CARE      DC Planning Services  CM consult      Choice offered to / List presented to:             Status of service:  Completed, signed off Medicare Important Message given?   (If response is "NO", the following Medicare IM given date fields will be blank) Date Medicare IM given:   Date Additional Medicare IM given:    Discharge Disposition:  HOME/SELF CARE  Per UR Regulation:  Reviewed for med. necessity/level of care/duration of stay  If discussed at Long Length of Stay Meetings, dates discussed:    Comments:   Eliquis $40 / copay for a 30 day supply - Prior Berkley Harvey is required - phone # to call is [phone] - must use preferred drug to get that price. Wilburt Finlay aware to call. Pt notified of cost. No further needs from CM at this time. Tomi Bamberger, RN,BSN [phone]   08-14-13 [address], [place] [phone] CM did provide pt with a 30 day free eliquis card. MD please write Rx for 30 day free no refills and then the original Rx with refills. Benefits check in process. CM will make pt aware of cost once complete.  No further needs from CM at this time.

## 2013-08-14 NOTE — Progress Notes (Signed)
Subjective: Feels better.  Objective: Vital signs in last 24 hours: Temp:  [98.2 F (36.8 C)-98.8 F (37.1 C)] 98.5 F (36.9 C) (09/03 0451) Pulse Rate:  [66-139] 66 (09/03 0451) Resp:  [18-20] 18 (09/03 0451) BP: (102-126)/(54-79) 116/54 mmHg (09/03 0451) SpO2:  [94 %-96 %] 94 % (09/03 0451) Weight:  [120 lb 8 oz (54.658 kg)] 120 lb 8 oz (54.658 kg) (09/02 1945) Last BM Date: 08/13/13  Intake/Output from previous day: 09/02 0701 - 09/03 0700 In: 1198.3 [I.V.:958.3] Out: -  Intake/Output this shift:    Medications Current Facility-Administered Medications  Medication Dose Route Frequency Provider Last Rate Last Dose  . acetaminophen (TYLENOL) tablet 650 mg  650 mg Oral Q4H PRN Wilburt Finlay, PA-C      . apixaban (ELIQUIS) tablet 2.5 mg  2.5 mg Oral BID Wilburt Finlay, PA-C   2.5 mg at 08/13/13 2100  . aspirin EC tablet 81 mg  81 mg Oral Daily Wilburt Finlay, PA-C   81 mg at 08/13/13 1742  . atorvastatin (LIPITOR) tablet 80 mg  80 mg Oral Daily Wilburt Finlay, PA-C      . benazepril (LOTENSIN) tablet 40 mg  40 mg Oral Daily Wilburt Finlay, PA-C      . diltiazem (CARDIZEM) 100 mg in dextrose 5 % 100 mL infusion  5-20 mg/hr Intravenous Titrated Wilburt Finlay, PA-C 5 mL/hr at 08/14/13 0453 5 mg/hr at 08/14/13 0453  . FLUoxetine (PROZAC) capsule 10 mg  10 mg Oral Daily Wilburt Finlay, PA-C      . levothyroxine (SYNTHROID, LEVOTHROID) tablet 100 mcg  100 mcg Oral QAC breakfast Wilburt Finlay, PA-C      . metoprolol (LOPRESSOR) tablet 50 mg  50 mg Oral BID Wilburt Finlay, PA-C   50 mg at 08/13/13 2100  . ondansetron (ZOFRAN) injection 4 mg  4 mg Intravenous Q6H PRN Wilburt Finlay, PA-C      . pantoprazole (PROTONIX) EC tablet 40 mg  40 mg Oral Daily Wilburt Finlay, PA-C   40 mg at 08/13/13 1715  . temazepam (RESTORIL) capsule 30 mg  30 mg Oral QHS PRN Thurmon Fair, MD   30 mg at 08/13/13 2148    PE: General appearance: alert, cooperative and no distress Lungs: clear to auscultation bilaterally Heart:  regular rate and rhythm, S1, S2 normal, no murmur, click, rub or gallop Extremities: No LEE Pulses: 2+ and symmetric  Lab Results:   Recent Labs  08/13/13 2006  WBC 8.4  HGB 12.4  HCT 35.7*  PLT 321   BMET  Recent Labs  08/13/13 2006 08/14/13 0537  NA 139 139  K 3.8 3.8  CL 103 103  CO2 24 27  GLUCOSE 200* 107*  BUN 14 13  CREATININE 1.09 1.11*  CALCIUM 9.2 9.1     Assessment/Plan   Active Problems:   Atrial fibrillation with rapid ventricular response  Plan:  She converted to NSR at 0246 hrs.  Change to cardizem CD 120mg .  Continue Eliquis 2.5mg .  BP stable.  She has been on 60mg  of lasix for a long time for LEE.  I am going to change that to 20mg  PRN plus of K+.  This would be better for her renal function and potentially give [place] more room with BP for cardizem.  DC today FU in 2-3 weeks .   LOS: 1 day    HAGER, BRYAN 08/14/2013 9:18 AM  I have seen and evaluated the patient this AM along with  Wilburt Finlay, PA.  I agree with his findings, examination as well as impression recommendations.  She converted to NSR ~ 2:46 AM - feels much better this AM. Has been ambulating without difficulty.  I agree with converting to PO Diltiazem - will need to monitor briefly after.  Needs Eliquis Rx card for d/c.   Should be ready for d/c by mid afternoon if she remains in NSR & stable.  Can ROV with Mr. Leron Croak next week, then [facility] after.   MD Time with pt: 15 min  HARDING,DAVID W, M.D., M.S. [facility] [address] [place], [place]  [address]  [phone] Pager # [phone] 08/14/2013 10:38 AM

## 2013-08-14 NOTE — Progress Notes (Signed)
Nutrition Brief Note  Patient identified on the Malnutrition Screening Tool (MST) Report. Pt reports recent poor appetite 2/2 UTI but that her intake is now improving. Discussed ways to increase oral intake when d/c - small frequent meals, oral nutrition supplements, etc.   Pt and chart state that pt is leaving to go home today.  Wt Readings from Last 15 Encounters:  08/13/13 120 lb 8 oz (54.658 kg)  08/13/13 120 lb 8 oz (54.658 kg)  04/27/13 124 lb 14.4 oz (56.654 kg)  02/23/12 142 lb 14.4 oz (64.819 kg)    Body mass index is 21.35 kg/(m^2). Patient meets criteria for WNL based on current BMI.   Current diet order is Heart Healthy, patient is consuming approximately 50% of meals at this time. Labs and medications reviewed.   No nutrition interventions warranted at this time. If nutrition issues arise, please consult RD.   Jarold Motto MS, RD, LDN Pager: [phone] After-hours pager: [phone],

## 2013-08-16 ENCOUNTER — Encounter: Payer: Self-pay | Admitting: Cardiovascular Disease

## 2013-08-16 ENCOUNTER — Ambulatory Visit: Payer: Medicare Other | Admitting: Cardiovascular Disease

## 2013-08-20 ENCOUNTER — Other Ambulatory Visit: Payer: Self-pay | Admitting: Gastroenterology

## 2013-08-20 DIAGNOSIS — R1013 Epigastric pain: Secondary | ICD-10-CM

## 2013-08-28 ENCOUNTER — Other Ambulatory Visit: Payer: Self-pay | Admitting: Gastroenterology

## 2013-08-28 ENCOUNTER — Ambulatory Visit
Admission: RE | Admit: 2013-08-28 | Discharge: 2013-08-28 | Disposition: A | Discharge status: Home / Self Care | Payer: Medicare Other | Source: Ambulatory Visit | Attending: Gastroenterology | Admitting: Gastroenterology

## 2013-08-28 DIAGNOSIS — R1013 Epigastric pain: Secondary | ICD-10-CM

## 2013-09-05 ENCOUNTER — Telehealth: Payer: Self-pay | Admitting: Cardiovascular Disease

## 2013-09-05 ENCOUNTER — Ambulatory Visit: Payer: Medicare Other | Admitting: Cardiovascular Disease

## 2013-09-05 NOTE — Telephone Encounter (Signed)
Please call-blood pressure been running high.

## 2013-09-05 NOTE — Telephone Encounter (Signed)
Take another Diltiazem 120 mg now. Keep appointment with Dr Allyson Sabal in am.

## 2013-09-05 NOTE — Telephone Encounter (Signed)
Returned call and informed pt per instructions by MD/PA.  Pt verbalized understanding and agreed w/ plan.  

## 2013-09-05 NOTE — Telephone Encounter (Signed)
Returned call.  Pt stated she has an appt w/ Dr. Allyson Sabal on Monday.  Stated since she's been out of the hospital her BP has been creeping up.  See following BPs.  Su: 174/81 Mo: 174/82 Tu: 163/71 W: 192/77 Th: 194/87  Pt wanted to know if it's okay for he to wait until Monday or if she should see Dr. Allyson Sabal sooner.  Pt also c/o dull HA that she stated feels like pressure.  Pt advised to be seen sooner and appt scheduled for tomorrow at 2pm w/ Dr. Allyson Sabal.  Pt also informed provider will be notified to review BPs since they have been quite elevated for the past week and may want to advise med adjustment.  Pt verbalized understanding and agreed w/ plan.  Message forwarded to Hinda Glatter, PA-C for further instructions.

## 2013-09-06 ENCOUNTER — Encounter: Payer: Self-pay | Admitting: Cardiovascular Disease

## 2013-09-06 ENCOUNTER — Ambulatory Visit (INDEPENDENT_AMBULATORY_CARE_PROVIDER_SITE_OTHER): Payer: Medicare Other | Admitting: Cardiovascular Disease

## 2013-09-06 VITALS — BP 200/80 | HR 67 | Ht 63.0 in | Wt 123.0 lb

## 2013-09-06 DIAGNOSIS — I10 Essential (primary) hypertension: Secondary | ICD-10-CM

## 2013-09-06 DIAGNOSIS — E785 Hyperlipidemia, unspecified: Secondary | ICD-10-CM

## 2013-09-06 DIAGNOSIS — I4891 Unspecified atrial fibrillation: Secondary | ICD-10-CM

## 2013-09-06 DIAGNOSIS — R079 Chest pain, unspecified: Secondary | ICD-10-CM

## 2013-09-06 MED ORDER — APIXABAN 2.5 MG PO TABS: 2.5000 mg | ORAL_TABLET | Freq: Two times a day (BID) | ORAL | Status: DC

## 2013-09-06 MED ORDER — DILTIAZEM HCL ER COATED BEADS 180 MG PO CP24: 180.0000 mg | ORAL_CAPSULE | Freq: Every day | ORAL | Status: DC

## 2013-09-06 NOTE — Assessment & Plan Note (Signed)
Admitted to the hospital 08/13/13 discharge the following day. She converted to sinus rhythm on IV diltiazem. Eliquis anticoagulation was begun and her amlodipine was changed to diltiazem. She has maintained sinus rhythm.

## 2013-09-06 NOTE — Progress Notes (Signed)
09/06/2013 Leah Holland   21-Nov-1922  [id number]  Primary Physician Herb Grays, MD Primary Cardiologist: Runell Gess MD [place], [place]   HPI: Leah Holland is a 79 y.o. Leah Holland is a 79 y.o. widowed, Caucasian female, mother of two living children (one deceased), grandmother to five grandchildren, who was last seen by Dr. Allyson Sabal on 07/30/12. She has a history of hypertension and hyperlipidemia, as well as family history of heart disease. She had a remote right total knee replacement and a recent left one by Dr. Madelon Lips. She had a Myoview stress test performed in our office on August 07, 2011, which was low risk. Recent lab work performed by Dr. Yehuda Budd revealed a total cholesterol of 133, HDL of 71, and HDL of 39.  Patient presented with apparent new onset atrial fibrillation with RVR. She was just seen [facility] on 08/03/2013 for chest pain. She was diagnosed with gastroesophageal reflux disease and a UTI. At that time she was in normal sinus rhythm at a rate of 79 beats per minute and with PACs. She reported dizziness, feeling tired and a racing HR since ~Sat/Sun. She otherwise denied nausea, vomiting, fever, chest pain, dizziness, PND, cough, congestion, abdominal pain, hematochezia, melena, lower extremity edema, claudication. No recent GIB.  She was admitted and started on IV diltiazem 5mg /hr and Eliquis 2.5mg  twice daily. She converted to NSR at 0246hrs. She was changed to PO Cardizem 120mg  daily. She's been taking lasix 60mg  daily for LEE for a long time. This was changed to 20mg  daily as needed a long with as needed potassium. This may be easier on her kidneys. Since discharge from the hospital she's remained fairly stable and maintaining sinus rhythm. Her blood pressure however has been significantly elevated as she does admit to anxiety related to situations at home.  Current Outpatient Prescriptions  Medication Sig Dispense Refill  .  acetaminophen (TYLENOL) 325 MG tablet Take 650 mg by mouth every 6 (six) hours as needed for pain.      Marland Kitchen apixaban (ELIQUIS) 2.5 MG TABS tablet Take 1 tablet (2.5 mg total) by mouth 2 (two) times daily.  90 tablet  3  . aspirin EC 81 MG tablet Take 81 mg by mouth daily.      Marland Kitchen atorvastatin (LIPITOR) 40 MG tablet Take 80 mg by mouth every evening.      . benazepril (LOTENSIN) 40 MG tablet Take 40 mg by mouth every evening.      . diltiazem (CARDIZEM CD) 180 MG 24 hr capsule Take 1 capsule (180 mg total) by mouth daily.  30 capsule  5  . esomeprazole (NEXIUM) 40 MG capsule Take 40 mg by mouth 2 (two) times daily. Take 1 po BID x 2 weeks then once a day      . Flaxseed, Linseed, (FLAXSEED OIL PO) Take 1 tablet by mouth daily.       Marland Kitchen FLUoxetine (PROZAC) 10 MG capsule Take 10 mg by mouth daily.      . furosemide (LASIX) 20 MG tablet Take 1 tablet (20 mg total) by mouth as needed for edema.  30 tablet  5  . levothyroxine (SYNTHROID, LEVOTHROID) 100 MCG tablet Take 100 mcg by mouth daily.       . metoprolol (LOPRESSOR) 50 MG tablet Take 50 mg by mouth 2 (two) times daily.      . Multiple Vitamins-Minerals (MACULAR VITAMIN BENEFIT PO) Take 1 tablet by mouth 4 (four) times daily.       Marland Kitchen  potassium chloride (K-DUR) 10 MEQ tablet Take 1 tablet (10 mEq total) by mouth as needed.  30 tablet  5  . PROBIOTIC CAPS Take 1 capsule by mouth daily.      . temazepam (RESTORIL) 30 MG capsule Take 30 mg by mouth at bedtime as needed for sleep.       No current facility-administered medications for this visit.    Allergies  Allergen Reactions  . Morphine And Related Other (See Comments)    Made pt "wild"    History   Social History  . Marital Status: Widowed    Spouse Name: N/A    Number of Children: N/A  . Years of Education: N/A   Occupational History  . Not on file.   Social History Main Topics  . Smoking status: Never Smoker   . Smokeless tobacco: Never Used  . Alcohol Use: No  . Drug Use: No    . Sexual Activity: No   Other Topics Concern  . Not on file   Social History Narrative  . No narrative on file     Review of Systems: General: negative for chills, fever, night sweats or weight changes.  Cardiovascular: negative for chest pain, dyspnea on exertion, edema, orthopnea, palpitations, paroxysmal nocturnal dyspnea or shortness of breath Dermatological: negative for rash Respiratory: negative for cough or wheezing Urologic: negative for hematuria Abdominal: negative for nausea, vomiting, diarrhea, bright red blood per rectum, melena, or hematemesis Neurologic: negative for visual changes, syncope, or dizziness All other systems reviewed and are otherwise negative except as noted above.    Blood pressure 200/80, pulse 67, height 5\' 3"  (1.6 m), weight 123 lb (55.792 kg).  General appearance: alert and no distress  EKG normal sinus rhythm at 67 without ST or T wave changes  ASSESSMENT AND PLAN:   Atrial fibrillation with rapid ventricular response Admitted to the hospital 08/13/13 discharge the following day. She converted to sinus rhythm on IV diltiazem. Eliquis anticoagulation was begun and her amlodipine was changed to diltiazem. She has maintained sinus rhythm.  Hypertension Poorly controlled today the systolic blood pressure 200. I'm going to increase her diltiazem from 120 mg to 180 mg. She will check daily blood pressures become and will be seen by Belenda Cruise , or Pharm.D., in one month for BP check and medicine titration.  Hyperlipidemia On statin therapy with recent lipid profile performed 04/26/13 revealing a total pressure 116, LDL of 60 and HDL of 45      Runell Gess MD [facility], [facility] 09/06/2013 2:25 PM

## 2013-09-06 NOTE — Assessment & Plan Note (Signed)
On statin therapy with recent lipid profile performed 04/26/13 revealing a total pressure 116, LDL of 60 and HDL of 45

## 2013-09-06 NOTE — Patient Instructions (Addendum)
Follow up with Belenda Cruise in 1 month for blood pressure check.  Follow up with an extender in 3 months and 6 months with Dr Allyson Sabal.  Increase the Cardizem to 180mg  daily

## 2013-09-06 NOTE — Assessment & Plan Note (Signed)
Poorly controlled today the systolic blood pressure 200. I'm going to increase her diltiazem from 120 mg to 180 mg. She will check daily blood pressures become and will be seen by Belenda Cruise , or Pharm.D., in one month for BP check and medicine titration.

## 2013-09-09 ENCOUNTER — Ambulatory Visit: Payer: Medicare Other | Admitting: Cardiovascular Disease

## 2013-10-07 ENCOUNTER — Encounter: Payer: Self-pay | Admitting: Pharmacist Clinician (PhC)/ Clinical Pharmacy Specialist

## 2013-10-07 ENCOUNTER — Ambulatory Visit (INDEPENDENT_AMBULATORY_CARE_PROVIDER_SITE_OTHER): Payer: Medicare Other | Admitting: Pharmacist Clinician (PhC)/ Clinical Pharmacy Specialist

## 2013-10-07 VITALS — BP 172/74 | HR 64

## 2013-10-07 DIAGNOSIS — I10 Essential (primary) hypertension: Secondary | ICD-10-CM

## 2013-10-07 MED ORDER — CHLORTHALIDONE 25 MG PO TABS: 25.0000 mg | ORAL_TABLET | Freq: Every day | ORAL | Status: DC

## 2013-10-07 NOTE — Progress Notes (Signed)
10/07/2013 Leah Holland [birth year] [id number]   HPI:  Leah Holland is a 79 y.o. female with a PMH below who presents today for a blood pressure check.  Per patient she has a long history of hypertension, going back to her 20s-30s.   She had been well controlled for many years on benazepril and amlodipine, but after developing atrial fibrillation almost 2 months ago, the amlodipine was switched to diltiazem for better rate control.  Since then her BP has been elevated, with home readings between 150-180 systolic and mostly 70s diastolic.  She has a strong family history of heart disease, including 5 of her 9 siblings and both of her sons had MIs while in their 52s.  She is compliant with her medications and exercises approx 30 minutes every other day in the gym at [facility].  She states that her stress levels have been fairly high this year, moving to [facility] in February, then having some issues with a family business.  She is very alert.     Current Outpatient Prescriptions  Medication Sig Dispense Refill  . acetaminophen (TYLENOL) 325 MG tablet Take 650 mg by mouth every 6 (six) hours as needed for pain.      Marland Kitchen apixaban (ELIQUIS) 2.5 MG TABS tablet Take 1 tablet (2.5 mg total) by mouth 2 (two) times daily.  90 tablet  3  . aspirin EC 81 MG tablet Take 81 mg by mouth daily.      Marland Kitchen atorvastatin (LIPITOR) 40 MG tablet Take 80 mg by mouth every evening.      . benazepril (LOTENSIN) 40 MG tablet Take 40 mg by mouth every evening.      . chlorthalidone (HYGROTON) 25 MG tablet Take 1 tablet (25 mg total) by mouth daily.  30 tablet  3  . diltiazem (CARDIZEM CD) 180 MG 24 hr capsule Take 1 capsule (180 mg total) by mouth daily.  30 capsule  5  . esomeprazole (NEXIUM) 40 MG capsule Take 40 mg by mouth 2 (two) times daily. Take 1 po BID x 2 weeks then once a day      . Flaxseed, Linseed, (FLAXSEED OIL PO) Take 1 tablet by mouth daily.       Marland Kitchen FLUoxetine (PROZAC) 10 MG capsule Take 10  mg by mouth daily.      . furosemide (LASIX) 20 MG tablet Take 1 tablet (20 mg total) by mouth as needed for edema.  30 tablet  5  . levothyroxine (SYNTHROID, LEVOTHROID) 100 MCG tablet Take 100 mcg by mouth daily.       . metoprolol (LOPRESSOR) 50 MG tablet Take 50 mg by mouth 2 (two) times daily.      . Multiple Vitamins-Minerals (MACULAR VITAMIN BENEFIT PO) Take 1 tablet by mouth 4 (four) times daily.       . potassium chloride (K-DUR) 10 MEQ tablet Take 1 tablet (10 mEq total) by mouth as needed.  30 tablet  5  . PROBIOTIC CAPS Take 1 capsule by mouth daily.      . temazepam (RESTORIL) 30 MG capsule Take 30 mg by mouth at bedtime as needed for sleep.       No current facility-administered medications for this visit.    Allergies  Allergen Reactions  . Morphine And Related Other (See Comments)    Made pt "wild"    Past Medical History  Diagnosis Date  . Complication of anesthesia   . PONV (postoperative nausea and  vomiting)   . Hypertension   . Hyperlipidemia   . Swelling of extremity     sees Dr. Allyson Sabal, cardiologist 1 x year [phone]  . Bronchitis 12/2011  . Hypothyroidism     takes synthroid  . GERD (gastroesophageal reflux disease)   . Neuromuscular disorder     carpal tunnel in right hand  . Insomnia   . Hyperlipidemia   . Skin cancer of face 2000's    "one on each side of my face and one on my nose" (08/13/2013)  . Diabetes mellitus type 2, diet-controlled   . History of blood transfusion     "w/both knee OR's" (08/13/2013)  . H/O hiatal hernia   . Arthritis     "probably" (08/13/2013)  . Atrial fibrillation     new onset/pt report 08/13/2013  . Depression   . Anxiety attack     "take RX prn" (08/13/2013)    Blood pressure 172/74, pulse 64. Weight 123 lb   ASSESSMENT AND PLAN:  [name]'s blood pressure is still not adequately controlled.  At her last visit with Dr. Allyson Sabal, the diltiazem was increased from 120mg  to 180mg , however this did not affect her BP.  I  will add chlorthalidone 25 mg once daily (am) and have her get a BMP in 2 weeks at [facility].  She bought a new blood pressure cuff this morning (her old one "died" yesterday) and it is accurate.  I have asked her to continue with daily BP checks at home and will have her return in 1 month for a follow up check.

## 2013-10-07 NOTE — Patient Instructions (Signed)
Come back in 4 weeks for a blood pressure check.  Go to the lab in 2 weeks for blood work.  Your blood pressure today is high at 172/74.  We will add chlorthalidone 25mg  once each morning to your schedule Check your blood pressure at home daily (if able) and keep record of the readings.   Bring all of your meds, your BP cuff and your record of home blood pressures to your next appointment.  Exercise as you're able, try to walk approximately 30 minutes per day.  Keep salt intake to a minimum, especially watch canned and prepared boxed foods.  Eat more fresh fruits and vegetables and fewer canned items.  Avoid eating in fast food restaurants.   HOW TO TAKE YOUR BLOOD PRESSURE:   Rest 5 minutes before taking your blood pressure.    Don't smoke or drink caffeinated beverages for at least 30 minutes before.   Take your blood pressure before (not after) you eat.   Sit comfortably with your back supported and both feet on the floor (don't cross your legs).   Elevate your arm to heart level on a table or a desk.   Use the proper sized cuff. It should fit smoothly and snugly around your bare upper arm. There should be enough room to slip a fingertip under the cuff. The bottom edge of the cuff should be 1 inch above the crease of the elbow. Ideally, take 3 measurements at one sitting and record the average.

## 2013-11-11 ENCOUNTER — Encounter: Payer: Medicare Other | Admitting: Pharmacist Clinician (PhC)/ Clinical Pharmacy Specialist

## 2013-11-18 ENCOUNTER — Encounter: Payer: Self-pay | Admitting: Physician Assistant

## 2013-11-18 ENCOUNTER — Ambulatory Visit (INDEPENDENT_AMBULATORY_CARE_PROVIDER_SITE_OTHER): Payer: Medicare Other | Admitting: Physician Assistant

## 2013-11-18 VITALS — BP 170/80 | HR 62 | Ht 64.0 in | Wt 124.0 lb

## 2013-11-18 DIAGNOSIS — I10 Essential (primary) hypertension: Secondary | ICD-10-CM

## 2013-11-18 DIAGNOSIS — E785 Hyperlipidemia, unspecified: Secondary | ICD-10-CM

## 2013-11-18 DIAGNOSIS — R0989 Other specified symptoms and signs involving the circulatory and respiratory systems: Secondary | ICD-10-CM | POA: Insufficient documentation

## 2013-11-18 DIAGNOSIS — I4891 Unspecified atrial fibrillation: Secondary | ICD-10-CM

## 2013-11-18 NOTE — Assessment & Plan Note (Addendum)
BP is elevated today but she brought in a diary showing her pressures over the last month are well controlled for the most part.  She will check her BP at home and if it is elevated, will call me back so I call adjust her medications.

## 2013-11-18 NOTE — Assessment & Plan Note (Signed)
She has a loud right carotid bruit and as near I can tell, no prior doppler evaluation.  We will schedule the study at the patient's request to early January.  Follow up with Dr. Allyson Sabal in 6 months or sooner if the study warrants it.

## 2013-11-18 NOTE — Assessment & Plan Note (Signed)
Hr is controlled and regular.  We discussed what symptoms night warrant a trip to the ER and when she can call in to the office.

## 2013-11-18 NOTE — Progress Notes (Signed)
Date:  11/18/2013   ID:  Leah Holland, DOB [birth year], MRN [id number]  PCP:  Herb Grays, MD  Primary Cardiologist:  Allyson Sabal     History of Present Illness: Leah Holland is a 79 y.o.  widowed, Caucasian female, mother of two living children (one deceased), grandmother to five grandchildren, who was last seen by Dr. Allyson Sabal on 07/30/12. She has a history of hypertension and hyperlipidemia, as well as family history of heart disease. She had a remote right total knee replacement and a recent left one by Dr. Madelon Lips. She had a Myoview stress test performed in our office on August 07, 2011, which was low risk. Recent lab work performed by Dr. Yehuda Budd revealed a total cholesterol of 133, HDL of 71, and HDL of 39.   Patient presented with apparent new onset atrial fibrillation with RVR. She was just seen at [facility] on 08/03/2013 for chest pain. She was diagnosed with gastroesophageal reflux disease and a UTI. At that time she was in normal sinus rhythm at a rate of 79 beats per minute and with PACs. She reported dizziness, feeling tired and a racing HR since ~Sat/Sun. She otherwise denied nausea, vomiting, fever, chest pain, dizziness, PND, cough, congestion, abdominal pain, hematochezia, melena, lower extremity edema, claudication. No recent GIB.   She was admitted and started on IV diltiazem 5mg /hr and Eliquis 2.5mg  twice daily. She converted to NSR at 0246hrs. She was changed to PO Cardizem 120mg  daily. She's been taking lasix 60mg  daily for LEE for a long time. This was changed to 20mg  daily as needed a long with as needed potassium. This may be easier on her kidneys.   Since discharge from the hospital she's remained fairly stable and maintaining sinus rhythm.   The patient presents today for six month evaluation.  She states she feels great and is very active and on the go.  No episodes of palpitations, SOB or dizziness.  She also denies nausea, vomiting, fever, chest pain,  shortness of breath, orthopnea, dizziness, PND, cough, congestion, abdominal pain, hematochezia, melena, lower extremity edema, claudication.  Wt Readings from Last 3 Encounters:  11/18/13 124 lb (56.246 kg)  09/06/13 123 lb (55.792 kg)  08/13/13 120 lb 8 oz (54.658 kg)     Past Medical History  Diagnosis Date  . Complication of anesthesia   . PONV (postoperative nausea and vomiting)   . Hypertension   . Hyperlipidemia   . Swelling of extremity     sees Dr. Allyson Sabal, cardiologist 1 x year [phone]  . Bronchitis 12/2011  . Hypothyroidism     takes synthroid  . GERD (gastroesophageal reflux disease)   . Neuromuscular disorder     carpal tunnel in right hand  . Insomnia   . Hyperlipidemia   . Skin cancer of face 2000's    "one on each side of my face and one on my nose" (08/13/2013)  . Diabetes mellitus type 2, diet-controlled   . History of blood transfusion     "w/both knee OR's" (08/13/2013)  . H/O hiatal hernia   . Arthritis     "probably" (08/13/2013)  . Atrial fibrillation     new onset/pt report 08/13/2013  . Depression   . Anxiety attack     "take RX prn" (08/13/2013)    Current Outpatient Prescriptions  Medication Sig Dispense Refill  . acetaminophen (TYLENOL) 325 MG tablet Take 650 mg by mouth every 6 (six) hours as needed for pain.      Marland Kitchen  apixaban (ELIQUIS) 2.5 MG TABS tablet Take 1 tablet (2.5 mg total) by mouth 2 (two) times daily.  90 tablet  3  . aspirin EC 81 MG tablet Take 81 mg by mouth daily.      Marland Kitchen atorvastatin (LIPITOR) 40 MG tablet Take 80 mg by mouth every evening.      . benazepril (LOTENSIN) 40 MG tablet Take 40 mg by mouth every evening.      . chlorthalidone (HYGROTON) 25 MG tablet Take 1 tablet (25 mg total) by mouth daily.  30 tablet  3  . diltiazem (CARDIZEM CD) 180 MG 24 hr capsule Take 1 capsule (180 mg total) by mouth daily.  30 capsule  5  . esomeprazole (NEXIUM) 40 MG capsule Take 40 mg by mouth 2 (two) times daily. Take 1 po BID x 2 weeks then  once a day      . Flaxseed, Linseed, (FLAXSEED OIL PO) Take 1 tablet by mouth daily.       Marland Kitchen FLUoxetine (PROZAC) 10 MG capsule Take 10 mg by mouth daily.      . furosemide (LASIX) 20 MG tablet Take 1 tablet (20 mg total) by mouth as needed for edema.  30 tablet  5  . levothyroxine (SYNTHROID, LEVOTHROID) 100 MCG tablet Take 100 mcg by mouth daily.       . metoprolol (LOPRESSOR) 50 MG tablet Take 50 mg by mouth 2 (two) times daily.      . Multiple Vitamins-Minerals (MACULAR VITAMIN BENEFIT PO) Take 1 tablet by mouth 4 (four) times daily.       . potassium chloride (K-DUR) 10 MEQ tablet Take 1 tablet (10 mEq total) by mouth as needed.  30 tablet  5  . PROBIOTIC CAPS Take 1 capsule by mouth daily.      . temazepam (RESTORIL) 30 MG capsule Take 30 mg by mouth at bedtime as needed for sleep.       No current facility-administered medications for this visit.    Allergies:    Allergies  Allergen Reactions  . Morphine And Related Other (See Comments)    Made pt "wild"    Social History:  The patient  reports that she has never smoked. She has never used smokeless tobacco. She reports that she does not drink alcohol or use illicit drugs.   Family history:   Family History  Problem Relation Age of Onset  . Arrhythmia Mother   . Heart disease Mother   . Arrhythmia Sister   . Arrhythmia Brother   . Heart disease Brother   . Stroke Father 39  . Heart disease Brother   . Heart disease Brother   . Cancer Brother     Colon cancer  . Heart disease Sister   . Heart disease Sister   . Stroke Sister   . Diabetes Sister   . Heart disease Child 46  . Heart disease Child 43    ROS:  Please see the history of present illness.  All other systems reviewed and negative.   PHYSICAL EXAM: VS:  BP 170/80  Pulse 62  Ht 5\' 4"  (1.626 m)  Wt 124 lb (56.246 kg)  BMI 21.27 kg/m2 Well nourished, well developed, in no acute distress HEENT: Pupils are equal round react to light accommodation  extraocular movements are intact.  Neck: no JVDNo cervical lymphadenopathy.  Loud right carotid Bruit. Cardiac: Regular rate and rhythm without murmurs rubs or gallops. Lungs:  clear to auscultation bilaterally, no wheezing, rhonchi or  rales Ext: no lower extremity edema.  2+ radial and dorsalis pedis pulses. Skin: warm and dry Neuro:  Grossly normal    ASSESSMENT AND PLAN:  Problem List Items Addressed This Visit   Atrial fibrillation with rapid ventricular response     Hr is controlled and regular.  We discussed what symptoms night warrant a trip to the ER and when she can call in to the office.    Hypertension     BP is elevated today but she brought in a diary showing her pressures over the last month are well controlled for the most part.  She will check her BP at home and if it is elevated, will call me back so I call adjust her medications.      Hyperlipidemia   Right carotid bruit - Primary     She has a loud right carotid bruit and as near I can tell, no prior doppler evaluation.  We will schedule the study at the patient's request to early January.  Follow up with Dr. Allyson Sabal in 6 months or sooner if the study warrants it.    Relevant Orders      Carotid duplex

## 2013-11-18 NOTE — Patient Instructions (Signed)
1.  Check blood pressure at home.  If it is around 130/80 or less, great.  If it is still 160's or higher on the top number, call the office and I will adjust your medications.  2.  I will also schedule a ultrasound of your neck.  3.  Follow up with Dr. Allyson Sabal in 6 months or we will schedule it sooner depending on the results of the study.

## 2013-12-17 ENCOUNTER — Ambulatory Visit (HOSPITAL_COMMUNITY)
Admission: RE | Admit: 2013-12-17 | Discharge: 2013-12-17 | Disposition: A | Discharge status: Home / Self Care | Payer: Medicare Other | Source: Ambulatory Visit | Attending: Cardiovascular Disease | Admitting: Cardiovascular Disease

## 2013-12-17 DIAGNOSIS — R0989 Other specified symptoms and signs involving the circulatory and respiratory systems: Secondary | ICD-10-CM | POA: Diagnosis not present

## 2013-12-17 NOTE — Progress Notes (Signed)
Carotid Duplex Completed. Chastin Riesgo, BS, RDMS, RVT  

## 2013-12-25 ENCOUNTER — Encounter: Payer: Self-pay | Admitting: *Deleted

## 2013-12-31 DIAGNOSIS — I10 Essential (primary) hypertension: Secondary | ICD-10-CM | POA: Diagnosis not present

## 2013-12-31 DIAGNOSIS — E785 Hyperlipidemia, unspecified: Secondary | ICD-10-CM | POA: Diagnosis not present

## 2014-01-01 ENCOUNTER — Telehealth: Payer: Self-pay | Admitting: Cardiovascular Disease

## 2014-01-01 NOTE — Telephone Encounter (Signed)
Left message that the BP reading that she got is ok. If she is in a-fib, then it is possible that all of the beats were not being recorded. As long as she isn't experiencing any abnormal symptoms she should be fine. If the dyastolic # continues to drop she is to call back to inform. Also left message that a letter with her test results has been mailed out to her. If she does not receive it within a few days she can feel free to call back to get verbal results.

## 2014-01-01 NOTE — Telephone Encounter (Signed)
Calling about her bp .Leah KitchenHer bp 138/54 and is concerned that the bottom number is low , also had some test done 12/17/13 and has not heard anything .Leah Holland Please call  Can leave message if she is not there ...    Thanks

## 2014-01-06 DIAGNOSIS — H353190 Nonexudative age-related macular degeneration, unspecified eye, stage unspecified: Secondary | ICD-10-CM | POA: Diagnosis not present

## 2014-02-04 ENCOUNTER — Other Ambulatory Visit: Payer: Self-pay | Admitting: *Deleted

## 2014-02-04 MED ORDER — CHLORTHALIDONE 25 MG PO TABS: 25.0000 mg | ORAL_TABLET | Freq: Every day | ORAL | Status: DC

## 2014-03-05 ENCOUNTER — Encounter: Payer: Self-pay | Admitting: Cardiovascular Disease

## 2014-03-05 ENCOUNTER — Ambulatory Visit (INDEPENDENT_AMBULATORY_CARE_PROVIDER_SITE_OTHER): Payer: Medicare Other | Admitting: Cardiovascular Disease

## 2014-03-05 VITALS — BP 132/60 | HR 57 | Ht 64.0 in | Wt 131.9 lb

## 2014-03-05 DIAGNOSIS — I10 Essential (primary) hypertension: Secondary | ICD-10-CM | POA: Diagnosis not present

## 2014-03-05 DIAGNOSIS — E785 Hyperlipidemia, unspecified: Secondary | ICD-10-CM | POA: Diagnosis not present

## 2014-03-05 DIAGNOSIS — M129 Arthropathy, unspecified: Secondary | ICD-10-CM | POA: Diagnosis not present

## 2014-03-05 DIAGNOSIS — M1990 Unspecified osteoarthritis, unspecified site: Secondary | ICD-10-CM

## 2014-03-05 DIAGNOSIS — I4891 Unspecified atrial fibrillation: Secondary | ICD-10-CM | POA: Diagnosis not present

## 2014-03-05 MED ORDER — DILTIAZEM HCL ER COATED BEADS 180 MG PO CP24: 180.0000 mg | ORAL_CAPSULE | Freq: Every day | ORAL | Status: DC

## 2014-03-05 NOTE — Assessment & Plan Note (Signed)
Well-controlled on current medications 

## 2014-03-05 NOTE — Patient Instructions (Signed)
Your physician wants you to follow-up in: 6 months with Bryan Hager and 1 year with Dr.Berry. You will receive a reminder letter in the mail two months in advance. If you don't receive a letter, please call our office to schedule the follow-up appointment.  

## 2014-03-05 NOTE — Assessment & Plan Note (Signed)
On statin therapy with her most recent lipid profile performed 04/26/13 revealing a glucose of 116, LDL 60 and HDL of 45

## 2014-03-05 NOTE — Progress Notes (Signed)
03/05/2014 Leah Holland   1922-07-19  [id number]  Primary Physician Florina Ou, MD Primary Cardiologist: Lorretta Harp MD [place], [place]   HPI:  Leah Holland is a 80y.o. Leah Holland is a 80 y.o. widowed, Caucasian female, mother of two living children (one deceased), grandmother to five grandchildren, who was last seen by Dr. Gwenlyn Found on 07/30/12.she is the youngest of 10 children. Her sister, [name] was also placed in mind that has passed away. She had one son who died again on, one son who is now a retired Lexicographer in one son who is a Biomedical scientist in [employer]. She has a history of hypertension and hyperlipidemia, as well as family history of heart disease. She had a remote right total knee replacement and a recent left one by Dr. French Ana. She had a Myoview stress test performed in our office on August 07, 2011, which was low risk. Recent lab work performed by Dr. Greta Doom revealed a total cholesterol of 133, HDL of 71, and HDL of 39.  Patient presented with apparent new onset atrial fibrillation with RVR. She was just [facility] on 08/03/2013 for chest pain. She was diagnosed with gastroesophageal reflux disease and a UTI. At that time she was in normal sinus rhythm at a rate of 79 beats per minute and with PACs. She reported dizziness, feeling tired and a racing HR since ~Sat/Sun. She otherwise denied nausea, vomiting, fever, chest pain, dizziness, PND, cough, congestion, abdominal pain, hematochezia, melena, lower extremity edema, claudication. No recent GIB.  She was admitted and started on IV diltiazem 5mg /hr and Eliquis 2.5mg  twice daily. She converted to NSR at 0246hrs. She was changed to PO Cardizem 120mg  daily. She's been taking lasix 60mg  daily for LEE for a long time. This was changed to 20mg  daily as needed a long with 75meq as needed potassium. This may be easier on her kidneys.  I last saw her in the office 9/26 was 14.  She was seen since that time by Tenny Craw PA-C on 11/18/13. She has remained completely asymptomatic and maintain sinus rhythm.     Current Outpatient Prescriptions  Medication Sig Dispense Refill  . acetaminophen (TYLENOL) 325 MG tablet Take 650 mg by mouth every 6 (six) hours as needed for pain.      Marland Kitchen apixaban (ELIQUIS) 2.5 MG TABS tablet Take 1 tablet (2.5 mg total) by mouth 2 (two) times daily.  90 tablet  3  . aspirin EC 81 MG tablet Take 81 mg by mouth daily.      Marland Kitchen atorvastatin (LIPITOR) 80 MG tablet Take 80 mg by mouth daily.      . benazepril (LOTENSIN) 40 MG tablet Take 40 mg by mouth every evening.      . chlorthalidone (HYGROTON) 25 MG tablet Take 1 tablet (25 mg total) by mouth daily.  30 tablet  5  . diltiazem (CARDIZEM CD) 180 MG 24 hr capsule Take 1 capsule (180 mg total) by mouth daily.  30 capsule  5  . esomeprazole (NEXIUM) 40 MG capsule Take 40 mg by mouth 2 (two) times daily. Take 1 po BID x 2 weeks then once a day      . Flaxseed, Linseed, (FLAXSEED OIL PO) Take 1 tablet by mouth daily.       Marland Kitchen FLUoxetine (PROZAC) 10 MG capsule Take 10 mg by mouth daily.      . furosemide (LASIX) 20 MG tablet Take 1  tablet (20 mg total) by mouth as needed for edema.  30 tablet  5  . levothyroxine (SYNTHROID, LEVOTHROID) 100 MCG tablet Take 100 mcg by mouth daily.       . metoprolol (LOPRESSOR) 50 MG tablet Take 50 mg by mouth 2 (two) times daily.      . Multiple Vitamins-Minerals (MACULAR VITAMIN BENEFIT PO) Take 1 tablet by mouth 4 (four) times daily.       . potassium chloride (K-DUR) 10 MEQ tablet Take 1 tablet (10 mEq total) by mouth as needed.  30 tablet  5  . PROBIOTIC CAPS Take 1 capsule by mouth daily.      . temazepam (RESTORIL) 30 MG capsule Take 30 mg by mouth at bedtime as needed for sleep.       No current facility-administered medications for this visit.    Allergies  Allergen Reactions  . Morphine And Related Other (See Comments)    Made pt "wild"    History    Social History  . Marital Status: Widowed    Spouse Name: N/A    Number of Children: N/A  . Years of Education: N/A   Occupational History  . Not on file.   Social History Main Topics  . Smoking status: Never Smoker   . Smokeless tobacco: Never Used  . Alcohol Use: No  . Drug Use: No  . Sexual Activity: No   Other Topics Concern  . Not on file   Social History Narrative  . No narrative on file     Review of Systems: General: negative for chills, fever, night sweats or weight changes.  Cardiovascular: negative for chest pain, dyspnea on exertion, edema, orthopnea, palpitations, paroxysmal nocturnal dyspnea or shortness of breath Dermatological: negative for rash Respiratory: negative for cough or wheezing Urologic: negative for hematuria Abdominal: negative for nausea, vomiting, diarrhea, bright red blood per rectum, melena, or hematemesis Neurologic: negative for visual changes, syncope, or dizziness All other systems reviewed and are otherwise negative except as noted above.    Blood pressure 132/60, pulse 57, height 5\' 4"  (1.626 m), weight 131 lb 14.4 oz (59.829 kg).  General appearance: alert and no distress Neck: no adenopathy, no carotid bruit, no JVD, supple, symmetrical, trachea midline and thyroid not enlarged, symmetric, no tenderness/mass/nodules Lungs: clear to auscultation bilaterally Heart: regular rate and rhythm, S1, S2 normal, no murmur, click, rub or gallop Extremities: extremities normal, atraumatic, no cyanosis or edema  EKG sinus bradycardia at 57 without ST or T wave changes  ASSESSMENT AND PLAN:   Arthritis Dictating sinus rhythm on Eliquis anticoagulation  Hyperlipidemia On statin therapy with her most recent lipid profile performed 04/26/13 revealing a glucose of 116, LDL 60 and HDL of 45  Hypertension Well-controlled on current medications      Lorretta Harp MD [facility], [facility] [place] [facility] 03/05/2014 2:05 PM

## 2014-03-05 NOTE — Assessment & Plan Note (Signed)
Dictating sinus rhythm on Eliquis anticoagulation

## 2014-03-11 ENCOUNTER — Other Ambulatory Visit: Payer: Self-pay | Admitting: *Deleted

## 2014-03-12 ENCOUNTER — Other Ambulatory Visit: Payer: Self-pay

## 2014-03-12 DIAGNOSIS — I4891 Unspecified atrial fibrillation: Secondary | ICD-10-CM

## 2014-03-12 MED ORDER — APIXABAN 2.5 MG PO TABS: 2.5000 mg | ORAL_TABLET | Freq: Two times a day (BID) | ORAL | Status: DC

## 2014-03-12 NOTE — Telephone Encounter (Deleted)
Rx was sent to pharmacy electronically. 

## 2014-03-25 DIAGNOSIS — F3289 Other specified depressive episodes: Secondary | ICD-10-CM | POA: Diagnosis not present

## 2014-03-25 DIAGNOSIS — F4001 Agoraphobia with panic disorder: Secondary | ICD-10-CM | POA: Diagnosis not present

## 2014-03-25 DIAGNOSIS — I10 Essential (primary) hypertension: Secondary | ICD-10-CM | POA: Diagnosis not present

## 2014-03-25 DIAGNOSIS — F329 Major depressive disorder, single episode, unspecified: Secondary | ICD-10-CM | POA: Diagnosis not present

## 2014-03-25 DIAGNOSIS — G4700 Insomnia, unspecified: Secondary | ICD-10-CM | POA: Diagnosis not present

## 2014-07-10 DIAGNOSIS — H353190 Nonexudative age-related macular degeneration, unspecified eye, stage unspecified: Secondary | ICD-10-CM | POA: Diagnosis not present

## 2014-07-21 ENCOUNTER — Telehealth: Payer: Self-pay | Admitting: Cardiovascular Disease

## 2014-07-21 NOTE — Telephone Encounter (Signed)
Closed encounter °

## 2014-07-22 DIAGNOSIS — E785 Hyperlipidemia, unspecified: Secondary | ICD-10-CM | POA: Diagnosis not present

## 2014-07-22 DIAGNOSIS — H9190 Unspecified hearing loss, unspecified ear: Secondary | ICD-10-CM | POA: Diagnosis not present

## 2014-07-22 DIAGNOSIS — I10 Essential (primary) hypertension: Secondary | ICD-10-CM | POA: Diagnosis not present

## 2014-07-22 DIAGNOSIS — E038 Other specified hypothyroidism: Secondary | ICD-10-CM | POA: Diagnosis not present

## 2014-07-28 ENCOUNTER — Other Ambulatory Visit: Payer: Self-pay | Admitting: Cardiovascular Disease

## 2014-07-29 NOTE — Telephone Encounter (Signed)
Rx was sent to pharmacy electronically. 

## 2014-08-14 DIAGNOSIS — H9319 Tinnitus, unspecified ear: Secondary | ICD-10-CM | POA: Diagnosis not present

## 2014-08-14 DIAGNOSIS — H903 Sensorineural hearing loss, bilateral: Secondary | ICD-10-CM | POA: Diagnosis not present

## 2014-08-25 ENCOUNTER — Other Ambulatory Visit: Payer: Self-pay | Admitting: Cardiovascular Disease

## 2014-08-25 NOTE — Telephone Encounter (Signed)
Rx refill sent to patient pharmacy   

## 2014-09-01 ENCOUNTER — Ambulatory Visit (INDEPENDENT_AMBULATORY_CARE_PROVIDER_SITE_OTHER): Payer: Medicare Other | Admitting: Physician Assistant

## 2014-09-01 ENCOUNTER — Encounter: Payer: Self-pay | Admitting: Physician Assistant

## 2014-09-01 VITALS — BP 171/71 | HR 53 | Ht 64.0 in | Wt 139.3 lb

## 2014-09-01 DIAGNOSIS — I480 Paroxysmal atrial fibrillation: Secondary | ICD-10-CM | POA: Insufficient documentation

## 2014-09-01 DIAGNOSIS — E785 Hyperlipidemia, unspecified: Secondary | ICD-10-CM

## 2014-09-01 DIAGNOSIS — I4891 Unspecified atrial fibrillation: Secondary | ICD-10-CM | POA: Diagnosis not present

## 2014-09-01 DIAGNOSIS — I10 Essential (primary) hypertension: Secondary | ICD-10-CM | POA: Diagnosis not present

## 2014-09-01 NOTE — Assessment & Plan Note (Signed)
On statin.

## 2014-09-01 NOTE — Progress Notes (Signed)
Date:  09/01/2014   ID:  Leah Holland, DOB 01-Sep-1922, MRN [id number]  PCP:  Florina Ou, MD  Primary Cardiologist:  Gwenlyn Found    History of Present Illness:  Leah Holland is a 80 y.o. female widowed, Caucasian female, mother of two living children (one deceased), grandmother to five grandchildren, who was last seen by Dr. Gwenlyn Found on 03/05/14.  She is the youngest of 10 children.  She had one son who died again on, one son who is now a retired Lexicographer in one son who is a Biomedical scientist in [employer]. She has a history of hypertension and hyperlipidemia, as well as family history of heart disease. She had a remote right total knee replacement and a recent left one by Dr. French Ana. She had a Myoview stress test performed in our office on August 07, 2011, which was low risk. Recent lab work performed by Dr. Greta Doom revealed a total cholesterol of 133, HDL of 71, and HDL of 39.   Patient presented with apparent new onset atrial fibrillation with RVR. She was just seen at [facility] on 08/03/2013 for chest pain. She was diagnosed with gastroesophageal reflux disease and a UTI. At that time she was in normal sinus rhythm at a rate of 79 beats per minute and with PACs.   Patient presents today for her six-month evaluation. She reports doing quite well her and her children recently went for a long walk [place].  She had no problems with walking.  She is staying very active and is having her 89th birthday party this coming weekend.  She currently denies nausea, vomiting, fever, chest pain, shortness of breath, orthopnea, dizziness, PND, cough, congestion, abdominal pain, hematochezia, melena, lower extremity edema, claudication.  Wt Readings from Last 3 Encounters:  09/01/14 139 lb 4.8 oz (63.186 kg)  03/05/14 131 lb 14.4 oz (59.829 kg)  11/18/13 124 lb (56.246 kg)     Past Medical History  Diagnosis Date  . Complication of anesthesia   .  PONV (postoperative nausea and vomiting)   . Hypertension   . Hyperlipidemia   . Swelling of extremity     sees Dr. Gwenlyn Found, cardiologist 1 x year [phone]  . Bronchitis 12/2011  . Hypothyroidism     takes synthroid  . GERD (gastroesophageal reflux disease)   . Neuromuscular disorder     carpal tunnel in right hand  . Insomnia   . Hyperlipidemia   . Skin cancer of face 2000's    "one on each side of my face and one on my nose" (08/13/2013)  . Diabetes mellitus type 2, diet-controlled   . History of blood transfusion     "w/both knee OR's" (08/13/2013)  . H/O hiatal hernia   . Arthritis     "probably" (08/13/2013)  . Atrial fibrillation     new onset/pt report 08/13/2013  . Depression   . Anxiety attack     "take RX prn" (08/13/2013)    Current Outpatient Prescriptions  Medication Sig Dispense Refill  . acetaminophen (TYLENOL) 325 MG tablet Take 650 mg by mouth every 6 (six) hours as needed for pain.      Marland Kitchen apixaban (ELIQUIS) 2.5 MG TABS tablet Take 1 tablet (2.5 mg total) by mouth 2 (two) times daily.  180 tablet  2  . aspirin EC 81 MG tablet Take 81 mg by mouth daily.      Marland Kitchen atorvastatin (LIPITOR)  80 MG tablet Take 80 mg by mouth daily.      . benazepril (LOTENSIN) 40 MG tablet Take 40 mg by mouth every evening.      . chlorthalidone (HYGROTON) 25 MG tablet TAKE 1 TABLET ONCE DAILY.  30 tablet  7  . diltiazem (CARDIZEM CD) 180 MG 24 hr capsule TAKE 1 CAPSULE DAILY.  30 capsule  5  . esomeprazole (NEXIUM) 40 MG capsule Take 40 mg by mouth 2 (two) times daily. Take 1 po BID x 2 weeks then once a day      . Flaxseed, Linseed, (FLAXSEED OIL PO) Take 1 tablet by mouth daily.       Marland Kitchen FLUoxetine (PROZAC) 10 MG capsule Take 10 mg by mouth daily.      . furosemide (LASIX) 20 MG tablet Take 1 tablet (20 mg total) by mouth as needed for edema.  30 tablet  5  . levothyroxine (SYNTHROID, LEVOTHROID) 100 MCG tablet Take 100 mcg by mouth daily.       . metoprolol (LOPRESSOR) 50 MG tablet Take 50  mg by mouth 2 (two) times daily.      . Multiple Vitamins-Minerals (MACULAR VITAMIN BENEFIT PO) Take 1 tablet by mouth 4 (four) times daily.       . potassium chloride (K-DUR) 10 MEQ tablet Take 1 tablet (10 mEq total) by mouth as needed.  30 tablet  5  . PROBIOTIC CAPS Take 1 capsule by mouth daily.      . temazepam (RESTORIL) 30 MG capsule Take 30 mg by mouth at bedtime as needed for sleep.       No current facility-administered medications for this visit.    Allergies:    Allergies  Allergen Reactions  . Morphine And Related Other (See Comments)    Made pt "wild"    Social History:  The patient  reports that she has never smoked. She has never used smokeless tobacco. She reports that she does not drink alcohol or use illicit drugs.   Family history:   Family History  Problem Relation Age of Onset  . Arrhythmia Mother   . Heart disease Mother   . Arrhythmia Sister   . Arrhythmia Brother   . Heart disease Brother   . Stroke Father 26  . Heart disease Brother   . Heart disease Brother   . Cancer Brother     Colon cancer  . Heart disease Sister   . Heart disease Sister   . Stroke Sister   . Diabetes Sister   . Heart disease Child 19  . Heart disease Child 43    ROS:  Please see the history of present illness.  All other systems reviewed and negative.   PHYSICAL EXAM: VS:  BP 171/71  Pulse 53  Ht 5\' 4"  (1.626 m)  Wt 139 lb 4.8 oz (63.186 kg)  BMI 23.90 kg/m2 Well nourished, well developed, in no acute distress HEENT: Pupils are equal round react to light accommodation extraocular movements are intact.  Neck: no JVDNo cervical lymphadenopathy. Cardiac: Regular rate and rhythm without murmurs rubs or gallops. Lungs:  clear to auscultation bilaterally, no wheezing, rhonchi or rales Ext: no lower extremity edema.  2+ radial and dorsalis pedis pulses. Skin: warm and dry Neuro:  Grossly normal  EKG:  Sinus bradycardia 54 beats per minute  ASSESSMENT AND  PLAN:  Problem List Items Addressed This Visit   Hypertension     Blood pressure is elevated today, however, it ranges 124-145/50-62  at home for the last 4 days.  no changes    Hyperlipidemia     On statin    Paroxysmal atrial fibrillation - Primary     Maintain a sinus bradycardia rate of 54 beats per minute. Continue current therapy    Relevant Orders      EKG 12-Lead

## 2014-09-01 NOTE — Assessment & Plan Note (Signed)
Blood pressure is elevated today, however, it ranges 124-145/50-62 at home for the last 4 days.  no changes

## 2014-09-01 NOTE — Assessment & Plan Note (Signed)
Maintain a sinus bradycardia rate of 54 beats per minute. Continue current therapy

## 2014-09-01 NOTE — Patient Instructions (Signed)
1.  Follow up with Dr. Berry in 6 months. 

## 2014-10-06 DIAGNOSIS — R899 Unspecified abnormal finding in specimens from other organs, systems and tissues: Secondary | ICD-10-CM | POA: Diagnosis not present

## 2014-10-07 DIAGNOSIS — R05 Cough: Secondary | ICD-10-CM | POA: Diagnosis not present

## 2014-10-07 DIAGNOSIS — R0981 Nasal congestion: Secondary | ICD-10-CM | POA: Diagnosis not present

## 2014-10-13 DIAGNOSIS — I700 Atherosclerosis of aorta: Secondary | ICD-10-CM | POA: Diagnosis not present

## 2014-10-13 DIAGNOSIS — I509 Heart failure, unspecified: Secondary | ICD-10-CM | POA: Diagnosis not present

## 2014-10-14 ENCOUNTER — Telehealth: Payer: Self-pay | Admitting: Cardiovascular Disease

## 2014-10-14 DIAGNOSIS — R079 Chest pain, unspecified: Secondary | ICD-10-CM | POA: Diagnosis not present

## 2014-10-14 DIAGNOSIS — M940 Chondrocostal junction syndrome [Tietze]: Secondary | ICD-10-CM | POA: Diagnosis not present

## 2014-10-14 NOTE — Telephone Encounter (Signed)
She can see her primary care physician to evaluate and if they feel she needs cardiac eval I be happy to say

## 2014-10-14 NOTE — Telephone Encounter (Signed)
Patient states that she has had chest pressure x 2 weeks.   She states that she had a chest x-ray yesterday but has not gotten the results.

## 2014-10-14 NOTE — Telephone Encounter (Signed)
Spoke with patient. She states for 2 weeks she has had a "light" chest pressure that is described as feeling like congestion and fullness. She states it feels like it is breaking up - after recommendation from D. Cobb (APP) at Dr. Teofilo Pod office to use mucus 12 hr extended release and flonase twice daily. She states she had some lab work and a CXR ordered by PCP office and has not heard from results. She has NO fever, denies chest pain, denies shortness of breath, denies lightheadedness/dizziness. She c/o dull headache in AM.   RN advised her to either schedule appmt with PCP or use walk-in clinic near her house [facility] [address]) for what sounds like some sort of upper respiratory issue.   Will defer to Dr. Gwenlyn Found to advise further as necessary.

## 2014-10-23 DIAGNOSIS — I10 Essential (primary) hypertension: Secondary | ICD-10-CM | POA: Diagnosis not present

## 2014-10-23 DIAGNOSIS — I4891 Unspecified atrial fibrillation: Secondary | ICD-10-CM | POA: Diagnosis not present

## 2014-10-23 DIAGNOSIS — Z23 Encounter for immunization: Secondary | ICD-10-CM | POA: Diagnosis not present

## 2014-10-23 DIAGNOSIS — F329 Major depressive disorder, single episode, unspecified: Secondary | ICD-10-CM | POA: Diagnosis not present

## 2014-10-23 DIAGNOSIS — G4700 Insomnia, unspecified: Secondary | ICD-10-CM | POA: Diagnosis not present

## 2014-10-23 DIAGNOSIS — R600 Localized edema: Secondary | ICD-10-CM | POA: Diagnosis not present

## 2014-10-23 DIAGNOSIS — R079 Chest pain, unspecified: Secondary | ICD-10-CM | POA: Diagnosis not present

## 2014-10-23 DIAGNOSIS — E785 Hyperlipidemia, unspecified: Secondary | ICD-10-CM | POA: Diagnosis not present

## 2014-12-15 ENCOUNTER — Other Ambulatory Visit: Payer: Self-pay | Admitting: Cardiovascular Disease

## 2014-12-29 DIAGNOSIS — R300 Dysuria: Secondary | ICD-10-CM | POA: Diagnosis not present

## 2015-01-23 DIAGNOSIS — E039 Hypothyroidism, unspecified: Secondary | ICD-10-CM | POA: Diagnosis not present

## 2015-01-23 DIAGNOSIS — I10 Essential (primary) hypertension: Secondary | ICD-10-CM | POA: Diagnosis not present

## 2015-01-23 DIAGNOSIS — G4700 Insomnia, unspecified: Secondary | ICD-10-CM | POA: Diagnosis not present

## 2015-01-23 DIAGNOSIS — N9489 Other specified conditions associated with female genital organs and menstrual cycle: Secondary | ICD-10-CM | POA: Diagnosis not present

## 2015-01-23 DIAGNOSIS — E785 Hyperlipidemia, unspecified: Secondary | ICD-10-CM | POA: Diagnosis not present

## 2015-01-23 DIAGNOSIS — I4891 Unspecified atrial fibrillation: Secondary | ICD-10-CM | POA: Diagnosis not present

## 2015-01-23 DIAGNOSIS — F329 Major depressive disorder, single episode, unspecified: Secondary | ICD-10-CM | POA: Diagnosis not present

## 2015-01-29 DIAGNOSIS — H3531 Nonexudative age-related macular degeneration: Secondary | ICD-10-CM | POA: Diagnosis not present

## 2015-01-29 DIAGNOSIS — H524 Presbyopia: Secondary | ICD-10-CM | POA: Diagnosis not present

## 2015-01-29 DIAGNOSIS — H04123 Dry eye syndrome of bilateral lacrimal glands: Secondary | ICD-10-CM | POA: Diagnosis not present

## 2015-02-23 ENCOUNTER — Other Ambulatory Visit: Payer: Self-pay | Admitting: Cardiovascular Disease

## 2015-02-23 NOTE — Telephone Encounter (Signed)
Rx has been sent to the pharmacy electronically. ° °

## 2015-03-02 DIAGNOSIS — N952 Postmenopausal atrophic vaginitis: Secondary | ICD-10-CM | POA: Diagnosis not present

## 2015-03-02 DIAGNOSIS — R339 Retention of urine, unspecified: Secondary | ICD-10-CM | POA: Diagnosis not present

## 2015-03-02 DIAGNOSIS — N393 Stress incontinence (female) (male): Secondary | ICD-10-CM | POA: Diagnosis not present

## 2015-03-03 ENCOUNTER — Ambulatory Visit (INDEPENDENT_AMBULATORY_CARE_PROVIDER_SITE_OTHER): Payer: Medicare Other | Admitting: Cardiovascular Disease

## 2015-03-03 ENCOUNTER — Encounter: Payer: Self-pay | Admitting: Cardiovascular Disease

## 2015-03-03 VITALS — BP 146/68 | HR 53 | Ht 64.0 in | Wt 141.9 lb

## 2015-03-03 DIAGNOSIS — I10 Essential (primary) hypertension: Secondary | ICD-10-CM

## 2015-03-03 DIAGNOSIS — I480 Paroxysmal atrial fibrillation: Secondary | ICD-10-CM

## 2015-03-03 DIAGNOSIS — E785 Hyperlipidemia, unspecified: Secondary | ICD-10-CM | POA: Diagnosis not present

## 2015-03-03 DIAGNOSIS — I4891 Unspecified atrial fibrillation: Secondary | ICD-10-CM | POA: Diagnosis not present

## 2015-03-03 NOTE — Patient Instructions (Signed)
Your physician recommends that you schedule a follow-up appointment in: One year with Dr.Berry   

## 2015-03-03 NOTE — Assessment & Plan Note (Signed)
History of hyperlipidemia on atorvastatin 80 mg a day followed by her PCP 

## 2015-03-03 NOTE — Assessment & Plan Note (Signed)
History of PAF maintaining normal sinus rhythm on Eliquis t oral and a coagulation.

## 2015-03-03 NOTE — Progress Notes (Signed)
03/03/2015 LYNORE COSCIA   1922-03-22  [id number]  Primary Physician Florina Ou, MD Primary Cardiologist: Lorretta Harp MD [place], [place]   HPI:   Leah Holland is a 81y.o. Leah Holland is a 81 y.o. widowed, Caucasian female, mother of two living children (one deceased), grandmother to five grandchildren, who I last saw 03/05/14. She is the youngest of 10 children. Her sister, [name] was also placed in mind that has passed away. She had one son who died again on, one son who is now a retired Lexicographer in one son who is a Biomedical scientist in [employer]. She has a history of hypertension and hyperlipidemia, as well as family history of heart disease. She had a remote right total knee replacement and a recent left one by Dr. French Ana. She had a Myoview stress test performed in our office on August 07, 2011, which was low risk. Recent lab work performed by Dr. Greta Doom revealed a total cholesterol of 133, HDL of 71, and HDL of 39.  Patient presented with apparent new onset atrial fibrillation with RVR. She was just [facility] on 08/03/2013 for chest pain. She was diagnosed with gastroesophageal reflux disease and a UTI. At that time she was in normal sinus rhythm at a rate of 79 beats per minute and with PACs. She reported dizziness, feeling tired and a racing HR since ~Sat/Sun. She otherwise denied nausea, vomiting, fever, chest pain, dizziness, PND, cough, congestion, abdominal pain, hematochezia, melena, lower extremity edema, claudication. No recent GIB.  She was admitted and started on IV diltiazem 5mg /hr and Eliquis 2.5mg  twice daily. She converted to NSR at 0246hrs. She was changed to PO Cardizem 120mg  daily. She's been taking lasix 60mg  daily for LEE for a long time. This was changed to 20mg  daily as needed a long with 50meq as needed potassium.  Since I saw her a year ago she's remained clinically stable specifically denying chest pain or  shortness of breath.   Current Outpatient Prescriptions  Medication Sig Dispense Refill  . acetaminophen (TYLENOL) 325 MG tablet Take 650 mg by mouth every 6 (six) hours as needed for pain.    Marland Kitchen aspirin EC 81 MG tablet Take 81 mg by mouth daily.    Marland Kitchen atorvastatin (LIPITOR) 80 MG tablet Take 80 mg by mouth daily.    . benazepril (LOTENSIN) 40 MG tablet Take 40 mg by mouth every evening.    . chlorthalidone (HYGROTON) 25 MG tablet TAKE 1 TABLET ONCE DAILY. 30 tablet 7  . diltiazem (CARDIZEM CD) 180 MG 24 hr capsule TAKE 1 CAPSULE DAILY. 30 capsule 2  . ELIQUIS 2.5 MG TABS tablet TAKE 1 TABLET TWICE DAILY. 180 tablet 1  . Flaxseed, Linseed, (FLAXSEED OIL PO) Take 1 tablet by mouth daily.     Marland Kitchen FLUoxetine (PROZAC) 20 MG capsule Take 1 capsule by mouth daily.    . furosemide (LASIX) 20 MG tablet Take 1 tablet (20 mg total) by mouth as needed for edema. 30 tablet 5  . levothyroxine (SYNTHROID, LEVOTHROID) 100 MCG tablet Take 100 mcg by mouth daily.     . metoprolol (LOPRESSOR) 50 MG tablet Take 50 mg by mouth 2 (two) times daily.    . Multiple Vitamins-Minerals (MACULAR VITAMIN BENEFIT PO) Take 1 tablet by mouth 2 (two) times daily.     . potassium chloride (K-DUR) 10 MEQ tablet Take 1 tablet (10 mEq total) by mouth as needed. 30 tablet 5  .  PROBIOTIC CAPS Take 1 capsule by mouth daily as needed (diarrhea).     . temazepam (RESTORIL) 30 MG capsule Take 30 mg by mouth at bedtime as needed for sleep.     No current facility-administered medications for this visit.    Allergies  Allergen Reactions  . Morphine And Related Other (See Comments)    Made pt "wild"    History   Social History  . Marital Status: Widowed    Spouse Name: N/A  . Number of Children: N/A  . Years of Education: N/A   Occupational History  . Not on file.   Social History Main Topics  . Smoking status: Never Smoker   . Smokeless tobacco: Never Used  . Alcohol Use: No  . Drug Use: No  . Sexual Activity: No    Other Topics Concern  . Not on file   Social History Narrative     Review of Systems: General: negative for chills, fever, night sweats or weight changes.  Cardiovascular: negative for chest pain, dyspnea on exertion, edema, orthopnea, palpitations, paroxysmal nocturnal dyspnea or shortness of breath Dermatological: negative for rash Respiratory: negative for cough or wheezing Urologic: negative for hematuria Abdominal: negative for nausea, vomiting, diarrhea, bright red blood per rectum, melena, or hematemesis Neurologic: negative for visual changes, syncope, or dizziness All other systems reviewed and are otherwise negative except as noted above.    Blood pressure 146/68, pulse 53, height 5\' 4"  (1.626 m), weight 141 lb 14.4 oz (64.365 kg).  General appearance: alert and no distress Neck: no adenopathy, no carotid bruit, no JVD, supple, symmetrical, trachea midline and thyroid not enlarged, symmetric, no tenderness/mass/nodules Lungs: clear to auscultation bilaterally Heart: regular rate and rhythm, S1, S2 normal, no murmur, click, rub or gallop Extremities: extremities normal, atraumatic, no cyanosis or edema  EKG sinus bradycardia 53 without ST or T-wave changes. I personally reviewed this EKG  ASSESSMENT AND PLAN:   Paroxysmal atrial fibrillation History of PAF maintaining normal sinus rhythm on Eliquis t oral and a coagulation.   Hypertension History of hypertension with blood pressure measurements at 146/68. She is on Lotensin, chlorthalidone, diltiazem and metoprolol. Continue current meds at current dosing   Hyperlipidemia History of hyperlipidemia on atorvastatin 80 mg a day followed by her PCP       Lorretta Harp MD [facility], [facility] 03/03/2015 3:16 PM

## 2015-03-03 NOTE — Assessment & Plan Note (Signed)
History of hypertension with blood pressure measurements at 146/68. She is on Lotensin, chlorthalidone, diltiazem and metoprolol. Continue current meds at current dosing

## 2015-03-30 ENCOUNTER — Other Ambulatory Visit: Payer: Self-pay | Admitting: Cardiovascular Disease

## 2015-03-30 NOTE — Telephone Encounter (Signed)
Rx refill sent to patient pharmacy   

## 2015-04-27 ENCOUNTER — Other Ambulatory Visit: Payer: Self-pay | Admitting: Cardiovascular Disease

## 2015-04-27 NOTE — Telephone Encounter (Signed)
Rx has been sent to the pharmacy electronically. ° °

## 2015-05-25 ENCOUNTER — Other Ambulatory Visit: Payer: Self-pay | Admitting: Cardiovascular Disease

## 2015-05-25 NOTE — Telephone Encounter (Signed)
E-SENT TO PHARMACY AND SPOKE TO PHARMACIST. DILTIAZEM REFILLED CHLORTHALIDONE DECLINED , REFILLED 03/2015 WITH 10 REFILLS

## 2015-06-09 DIAGNOSIS — I4891 Unspecified atrial fibrillation: Secondary | ICD-10-CM | POA: Diagnosis not present

## 2015-06-09 DIAGNOSIS — G4700 Insomnia, unspecified: Secondary | ICD-10-CM | POA: Diagnosis not present

## 2015-06-16 ENCOUNTER — Other Ambulatory Visit: Payer: Self-pay | Admitting: Cardiovascular Disease

## 2015-07-02 DIAGNOSIS — N952 Postmenopausal atrophic vaginitis: Secondary | ICD-10-CM | POA: Diagnosis not present

## 2015-07-02 DIAGNOSIS — N393 Stress incontinence (female) (male): Secondary | ICD-10-CM | POA: Diagnosis not present

## 2015-07-16 DIAGNOSIS — H3531 Nonexudative age-related macular degeneration: Secondary | ICD-10-CM | POA: Diagnosis not present

## 2015-07-30 ENCOUNTER — Inpatient Hospital Stay (HOSPITAL_COMMUNITY)
Admission: EM | Admit: 2015-07-30 | Discharge: 2015-08-03 | DRG: 481 | Disposition: A | Discharge status: Skilled Nursing Facility | Payer: Medicare Other | Attending: Internal Medicine | Admitting: Internal Medicine

## 2015-07-30 ENCOUNTER — Encounter (HOSPITAL_COMMUNITY): Payer: Self-pay | Admitting: *Deleted

## 2015-07-30 ENCOUNTER — Emergency Department (HOSPITAL_COMMUNITY): Payer: Medicare Other

## 2015-07-30 DIAGNOSIS — Z85828 Personal history of other malignant neoplasm of skin: Secondary | ICD-10-CM

## 2015-07-30 DIAGNOSIS — S72001D Fracture of unspecified part of neck of right femur, subsequent encounter for closed fracture with routine healing: Secondary | ICD-10-CM | POA: Diagnosis not present

## 2015-07-30 DIAGNOSIS — R1311 Dysphagia, oral phase: Secondary | ICD-10-CM | POA: Diagnosis not present

## 2015-07-30 DIAGNOSIS — Z9889 Other specified postprocedural states: Secondary | ICD-10-CM | POA: Insufficient documentation

## 2015-07-30 DIAGNOSIS — Z96653 Presence of artificial knee joint, bilateral: Secondary | ICD-10-CM | POA: Diagnosis present

## 2015-07-30 DIAGNOSIS — N179 Acute kidney failure, unspecified: Secondary | ICD-10-CM | POA: Diagnosis not present

## 2015-07-30 DIAGNOSIS — H269 Unspecified cataract: Secondary | ICD-10-CM | POA: Diagnosis present

## 2015-07-30 DIAGNOSIS — F411 Generalized anxiety disorder: Secondary | ICD-10-CM | POA: Diagnosis present

## 2015-07-30 DIAGNOSIS — E870 Hyperosmolality and hypernatremia: Secondary | ICD-10-CM | POA: Diagnosis not present

## 2015-07-30 DIAGNOSIS — R1313 Dysphagia, pharyngeal phase: Secondary | ICD-10-CM | POA: Diagnosis not present

## 2015-07-30 DIAGNOSIS — S72001A Fracture of unspecified part of neck of right femur, initial encounter for closed fracture: Secondary | ICD-10-CM | POA: Diagnosis not present

## 2015-07-30 DIAGNOSIS — Z885 Allergy status to narcotic agent status: Secondary | ICD-10-CM | POA: Diagnosis not present

## 2015-07-30 DIAGNOSIS — I10 Essential (primary) hypertension: Secondary | ICD-10-CM | POA: Diagnosis present

## 2015-07-30 DIAGNOSIS — E785 Hyperlipidemia, unspecified: Secondary | ICD-10-CM | POA: Diagnosis present

## 2015-07-30 DIAGNOSIS — S8991XA Unspecified injury of right lower leg, initial encounter: Secondary | ICD-10-CM | POA: Diagnosis not present

## 2015-07-30 DIAGNOSIS — Z791 Long term (current) use of non-steroidal anti-inflammatories (NSAID): Secondary | ICD-10-CM | POA: Diagnosis not present

## 2015-07-30 DIAGNOSIS — Z8249 Family history of ischemic heart disease and other diseases of the circulatory system: Secondary | ICD-10-CM

## 2015-07-30 DIAGNOSIS — S72141A Displaced intertrochanteric fracture of right femur, initial encounter for closed fracture: Secondary | ICD-10-CM | POA: Diagnosis not present

## 2015-07-30 DIAGNOSIS — T4145XA Adverse effect of unspecified anesthetic, initial encounter: Secondary | ICD-10-CM | POA: Diagnosis not present

## 2015-07-30 DIAGNOSIS — Z79899 Other long term (current) drug therapy: Secondary | ICD-10-CM

## 2015-07-30 DIAGNOSIS — G4700 Insomnia, unspecified: Secondary | ICD-10-CM | POA: Diagnosis present

## 2015-07-30 DIAGNOSIS — T148 Other injury of unspecified body region: Secondary | ICD-10-CM | POA: Diagnosis not present

## 2015-07-30 DIAGNOSIS — Z7982 Long term (current) use of aspirin: Secondary | ICD-10-CM | POA: Diagnosis not present

## 2015-07-30 DIAGNOSIS — D72829 Elevated white blood cell count, unspecified: Secondary | ICD-10-CM | POA: Diagnosis not present

## 2015-07-30 DIAGNOSIS — I480 Paroxysmal atrial fibrillation: Secondary | ICD-10-CM | POA: Diagnosis not present

## 2015-07-30 DIAGNOSIS — D500 Iron deficiency anemia secondary to blood loss (chronic): Secondary | ICD-10-CM | POA: Diagnosis present

## 2015-07-30 DIAGNOSIS — E119 Type 2 diabetes mellitus without complications: Secondary | ICD-10-CM | POA: Diagnosis present

## 2015-07-30 DIAGNOSIS — W19XXXA Unspecified fall, initial encounter: Secondary | ICD-10-CM | POA: Diagnosis present

## 2015-07-30 DIAGNOSIS — S72101A Unspecified trochanteric fracture of right femur, initial encounter for closed fracture: Secondary | ICD-10-CM | POA: Diagnosis not present

## 2015-07-30 DIAGNOSIS — M84359S Stress fracture, hip, unspecified, sequela: Secondary | ICD-10-CM | POA: Diagnosis not present

## 2015-07-30 DIAGNOSIS — E860 Dehydration: Secondary | ICD-10-CM | POA: Diagnosis not present

## 2015-07-30 DIAGNOSIS — R498 Other voice and resonance disorders: Secondary | ICD-10-CM | POA: Diagnosis not present

## 2015-07-30 DIAGNOSIS — D689 Coagulation defect, unspecified: Secondary | ICD-10-CM | POA: Diagnosis not present

## 2015-07-30 DIAGNOSIS — C44300 Unspecified malignant neoplasm of skin of unspecified part of face: Secondary | ICD-10-CM | POA: Diagnosis present

## 2015-07-30 DIAGNOSIS — T8859XA Other complications of anesthesia, initial encounter: Secondary | ICD-10-CM

## 2015-07-30 DIAGNOSIS — R531 Weakness: Secondary | ICD-10-CM | POA: Diagnosis not present

## 2015-07-30 DIAGNOSIS — E039 Hypothyroidism, unspecified: Secondary | ICD-10-CM | POA: Diagnosis present

## 2015-07-30 DIAGNOSIS — M25551 Pain in right hip: Secondary | ICD-10-CM | POA: Diagnosis not present

## 2015-07-30 DIAGNOSIS — S5011XA Contusion of right forearm, initial encounter: Secondary | ICD-10-CM | POA: Diagnosis not present

## 2015-07-30 DIAGNOSIS — M6281 Muscle weakness (generalized): Secondary | ICD-10-CM | POA: Diagnosis not present

## 2015-07-30 DIAGNOSIS — S50811A Abrasion of right forearm, initial encounter: Secondary | ICD-10-CM | POA: Diagnosis not present

## 2015-07-30 DIAGNOSIS — F319 Bipolar disorder, unspecified: Secondary | ICD-10-CM | POA: Diagnosis present

## 2015-07-30 DIAGNOSIS — R2681 Unsteadiness on feet: Secondary | ICD-10-CM | POA: Diagnosis not present

## 2015-07-30 DIAGNOSIS — Z967 Presence of other bone and tendon implants: Secondary | ICD-10-CM | POA: Diagnosis not present

## 2015-07-30 DIAGNOSIS — S299XXA Unspecified injury of thorax, initial encounter: Secondary | ICD-10-CM | POA: Diagnosis not present

## 2015-07-30 DIAGNOSIS — K219 Gastro-esophageal reflux disease without esophagitis: Secondary | ICD-10-CM | POA: Diagnosis present

## 2015-07-30 DIAGNOSIS — Z8781 Personal history of (healed) traumatic fracture: Secondary | ICD-10-CM

## 2015-07-30 DIAGNOSIS — E038 Other specified hypothyroidism: Secondary | ICD-10-CM | POA: Diagnosis not present

## 2015-07-30 DIAGNOSIS — S72141D Displaced intertrochanteric fracture of right femur, subsequent encounter for closed fracture with routine healing: Secondary | ICD-10-CM | POA: Diagnosis not present

## 2015-07-30 DIAGNOSIS — R0989 Other specified symptoms and signs involving the circulatory and respiratory systems: Secondary | ICD-10-CM | POA: Diagnosis present

## 2015-07-30 HISTORY — DX: Displaced intertrochanteric fracture of right femur, initial encounter for closed fracture: S72.141A

## 2015-07-30 LAB — URINALYSIS, ROUTINE W REFLEX MICROSCOPIC
Bilirubin Urine: NEGATIVE
GLUCOSE, UA: NEGATIVE mg/dL
Hgb urine dipstick: NEGATIVE
KETONES UR: NEGATIVE mg/dL
LEUKOCYTES UA: NEGATIVE
Nitrite: NEGATIVE
PH: 7.5 (ref 5.0–8.0)
Protein, ur: NEGATIVE mg/dL
SPECIFIC GRAVITY, URINE: 1.018 (ref 1.005–1.030)
Urobilinogen, UA: 1 mg/dL (ref 0.0–1.0)

## 2015-07-30 LAB — CBC WITH DIFFERENTIAL/PLATELET
Basophils Absolute: 0 10*3/uL (ref 0.0–0.1)
Basophils Relative: 0 % (ref 0–1)
EOS PCT: 1 % (ref 0–5)
Eosinophils Absolute: 0.1 10*3/uL (ref 0.0–0.7)
HEMATOCRIT: 42.8 % (ref 36.0–46.0)
Hemoglobin: 14.5 g/dL (ref 12.0–15.0)
LYMPHS ABS: 1.5 10*3/uL (ref 0.7–4.0)
LYMPHS PCT: 12 % (ref 12–46)
MCH: 30.7 pg (ref 26.0–34.0)
MCHC: 33.9 g/dL (ref 30.0–36.0)
MCV: 90.7 fL (ref 78.0–100.0)
MONO ABS: 0.7 10*3/uL (ref 0.1–1.0)
Monocytes Relative: 5 % (ref 3–12)
Neutro Abs: 10.2 10*3/uL — ABNORMAL HIGH (ref 1.7–7.7)
Neutrophils Relative %: 82 % — ABNORMAL HIGH (ref 43–77)
PLATELETS: 187 10*3/uL (ref 150–400)
RBC: 4.72 MIL/uL (ref 3.87–5.11)
RDW: 12.8 % (ref 11.5–15.5)
WBC: 12.4 10*3/uL — ABNORMAL HIGH (ref 4.0–10.5)

## 2015-07-30 LAB — COMPREHENSIVE METABOLIC PANEL
ALBUMIN: 4.3 g/dL (ref 3.5–5.0)
ALT: 21 U/L (ref 14–54)
AST: 25 U/L (ref 15–41)
Alkaline Phosphatase: 104 U/L (ref 38–126)
Anion gap: 10 (ref 5–15)
BUN: 20 mg/dL (ref 6–20)
CHLORIDE: 96 mmol/L — AB (ref 101–111)
CO2: 30 mmol/L (ref 22–32)
Calcium: 10 mg/dL (ref 8.9–10.3)
Creatinine, Ser: 0.83 mg/dL (ref 0.44–1.00)
GFR calc Af Amer: >60 mL/min (ref >60)
GFR calc non Af Amer: >60 mL/min (ref >60)
GLUCOSE: 138 mg/dL — AB (ref 65–99)
POTASSIUM: 3.5 mmol/L (ref 3.5–5.1)
SODIUM: 136 mmol/L (ref 135–145)
Total Bilirubin: 1.5 mg/dL — ABNORMAL HIGH (ref 0.3–1.2)
Total Protein: 7.2 g/dL (ref 6.5–8.1)

## 2015-07-30 LAB — PROTIME-INR
INR: 1.12 (ref 0.00–1.49)
Prothrombin Time: 14.6 seconds (ref 11.6–15.2)

## 2015-07-30 MED ORDER — LEVOTHYROXINE SODIUM 100 MCG PO TABS
100.0000 ug | ORAL_TABLET | Freq: Every day | ORAL | Status: DC
  Administered 2015-07-31 – 2015-08-03 (×3): 100 ug via ORAL
  Filled 2015-07-30 (×5): qty 1

## 2015-07-30 MED ORDER — APIXABAN 2.5 MG PO TABS: 2.5000 mg | ORAL_TABLET | Freq: Two times a day (BID) | ORAL | Status: DC

## 2015-07-30 MED ORDER — METOPROLOL TARTRATE 50 MG PO TABS
50.0000 mg | ORAL_TABLET | Freq: Two times a day (BID) | ORAL | Status: DC
  Administered 2015-07-30 – 2015-08-02 (×5): 50 mg via ORAL
  Filled 2015-07-30 (×7): qty 1

## 2015-07-30 MED ORDER — MORPHINE SULFATE (PF) 2 MG/ML IV SOLN: 0.5000 mg | INTRAVENOUS | Status: DC | PRN

## 2015-07-30 MED ORDER — HYDROCODONE-ACETAMINOPHEN 5-325 MG PO TABS
1.0000 | ORAL_TABLET | Freq: Four times a day (QID) | ORAL | Status: DC | PRN
  Administered 2015-07-30 – 2015-08-01 (×5): 1 via ORAL
  Filled 2015-07-30: qty 1
  Filled 2015-07-30: qty 2
  Filled 2015-07-30 (×3): qty 1

## 2015-07-30 MED ORDER — RISAQUAD PO CAPS
1.0000 | ORAL_CAPSULE | Freq: Every day | ORAL | Status: DC | PRN
  Filled 2015-07-30: qty 1

## 2015-07-30 MED ORDER — ASPIRIN EC 81 MG PO TBEC
81.0000 mg | DELAYED_RELEASE_TABLET | Freq: Every day | ORAL | Status: DC
  Administered 2015-07-31 – 2015-08-03 (×3): 81 mg via ORAL
  Filled 2015-07-30 (×4): qty 1

## 2015-07-30 MED ORDER — POLYETHYLENE GLYCOL 3350 17 G PO PACK: 17.0000 g | PACK | Freq: Every day | ORAL | Status: DC | PRN

## 2015-07-30 MED ORDER — SORBITOL 70 % SOLN
30.0000 mL | Freq: Every day | Status: DC | PRN
Start: 2015-07-30 — End: 2015-08-01

## 2015-07-30 MED ORDER — FUROSEMIDE 20 MG PO TABS: 20.0000 mg | ORAL_TABLET | ORAL | Status: DC | PRN

## 2015-07-30 MED ORDER — PANTOPRAZOLE SODIUM 40 MG PO TBEC
80.0000 mg | DELAYED_RELEASE_TABLET | Freq: Every day | ORAL | Status: DC
  Administered 2015-07-31 – 2015-08-03 (×4): 80 mg via ORAL
  Filled 2015-07-30 (×4): qty 2

## 2015-07-30 MED ORDER — ONDANSETRON HCL 4 MG/5ML PO SOLN
4.0000 mg | Freq: Once | ORAL | Status: DC
  Administered 2015-07-30: 4 mg via ORAL
  Filled 2015-07-30: qty 5

## 2015-07-30 MED ORDER — FENTANYL CITRATE (PF) 100 MCG/2ML IJ SOLN
50.0000 ug | Freq: Once | INTRAMUSCULAR | Status: AC
  Administered 2015-07-30: 50 ug via INTRAVENOUS
  Filled 2015-07-30: qty 2

## 2015-07-30 MED ORDER — DILTIAZEM HCL ER COATED BEADS 180 MG PO CP24
180.0000 mg | ORAL_CAPSULE | Freq: Every day | ORAL | Status: DC
  Administered 2015-07-31 – 2015-08-03 (×3): 180 mg via ORAL
  Filled 2015-07-30 (×4): qty 1

## 2015-07-30 MED ORDER — DOCUSATE SODIUM 100 MG PO CAPS
100.0000 mg | ORAL_CAPSULE | Freq: Two times a day (BID) | ORAL | Status: DC
  Administered 2015-07-30 – 2015-07-31 (×3): 100 mg via ORAL
  Filled 2015-07-30 (×5): qty 1

## 2015-07-30 MED ORDER — ONDANSETRON 8 MG PO TBDP: 8.0000 mg | ORAL_TABLET | Freq: Once | ORAL | Status: DC

## 2015-07-30 MED ORDER — ACETAMINOPHEN 325 MG PO TABS
650.0000 mg | ORAL_TABLET | Freq: Once | ORAL | Status: AC
  Administered 2015-07-30: 650 mg via ORAL
  Filled 2015-07-30: qty 2

## 2015-07-30 MED ORDER — FLAXSEED OIL 1200 MG PO CAPS: ORAL_CAPSULE | Freq: Two times a day (BID) | ORAL | Status: DC

## 2015-07-30 MED ORDER — HYDROCODONE-ACETAMINOPHEN 5-325 MG PO TABS: 1.0000 | ORAL_TABLET | Freq: Four times a day (QID) | ORAL | Status: DC | PRN

## 2015-07-30 MED ORDER — FLUOXETINE HCL 20 MG PO CAPS
20.0000 mg | ORAL_CAPSULE | Freq: Every day | ORAL | Status: DC
  Administered 2015-07-31 – 2015-08-03 (×3): 20 mg via ORAL
  Filled 2015-07-30 (×4): qty 1

## 2015-07-30 MED ORDER — ACETAMINOPHEN 325 MG PO TABS
650.0000 mg | ORAL_TABLET | Freq: Four times a day (QID) | ORAL | Status: DC | PRN
  Administered 2015-07-31: 650 mg via ORAL
  Filled 2015-07-30: qty 2

## 2015-07-30 MED ORDER — BENAZEPRIL HCL 40 MG PO TABS
40.0000 mg | ORAL_TABLET | Freq: Every evening | ORAL | Status: DC
  Administered 2015-07-30 – 2015-08-01 (×3): 40 mg via ORAL
  Filled 2015-07-30: qty 1
  Filled 2015-07-30: qty 4
  Filled 2015-07-30 (×4): qty 1

## 2015-07-30 MED ORDER — CHLORTHALIDONE 25 MG PO TABS
25.0000 mg | ORAL_TABLET | Freq: Every day | ORAL | Status: DC
  Administered 2015-07-31 – 2015-08-03 (×3): 25 mg via ORAL
  Filled 2015-07-30 (×4): qty 1

## 2015-07-30 MED ORDER — BISACODYL 5 MG PO TBEC: 5.0000 mg | DELAYED_RELEASE_TABLET | Freq: Every day | ORAL | Status: DC | PRN

## 2015-07-30 MED ORDER — FLEET ENEMA 7-19 GM/118ML RE ENEM: 1.0000 | ENEMA | Freq: Once | RECTAL | Status: DC | PRN

## 2015-07-30 MED ORDER — FENTANYL CITRATE (PF) 100 MCG/2ML IJ SOLN: 50.0000 ug | Freq: Once | INTRAMUSCULAR | Status: DC

## 2015-07-30 MED ORDER — ATORVASTATIN CALCIUM 80 MG PO TABS
80.0000 mg | ORAL_TABLET | Freq: Every day | ORAL | Status: DC
  Administered 2015-07-30 – 2015-08-02 (×4): 80 mg via ORAL
  Filled 2015-07-30 (×2): qty 1
  Filled 2015-07-30: qty 2
  Filled 2015-07-30 (×2): qty 1

## 2015-07-30 MED ORDER — TEMAZEPAM 15 MG PO CAPS
30.0000 mg | ORAL_CAPSULE | Freq: Every evening | ORAL | Status: DC | PRN
  Administered 2015-07-30 – 2015-08-02 (×4): 30 mg via ORAL
  Filled 2015-07-30 (×4): qty 2

## 2015-07-30 MED ORDER — ONDANSETRON HCL 4 MG/2ML IJ SOLN
4.0000 mg | INTRAMUSCULAR | Status: AC | PRN
  Administered 2015-07-30: 4 mg via INTRAVENOUS
  Filled 2015-07-30: qty 2

## 2015-07-30 MED ORDER — PROBIOTIC PO CAPS: 1.0000 | ORAL_CAPSULE | Freq: Every day | ORAL | Status: DC | PRN

## 2015-07-30 NOTE — ED Provider Notes (Signed)
CSN: [id number]     Arrival date & time 07/30/15  1335 History   First MD Initiated Contact with Patient 07/30/15 1441     Chief Complaint  Patient presents with  . Hip Pain    right, post fall     (Consider location/radiation/quality/duration/timing/severity/associated sxs/prior Treatment) HPI Comments: Very pleasant 81-yo patient suffering a mechanical fall just PTA after tripping on a curb while stepping up onto it. She fell onto her right side hitting her forearm and hip. She denies hitting her head and there was no LOC. She denies abdominal/chest/neck pain. There is a right forearm wound with swelling but no other lacerations/abrasions. She complains of right forearm and hip pain only. She has a history of atrial fibrillation on Eliquis, HYN, GERD, T2DM (diet controlled) and has had a right knee replacement (2013, Dr. French Ana).   Patient is a 81 y.o. female presenting with hip pain. The history is provided by the patient and a relative. No language interpreter was used.  Hip Pain This is a new problem. The current episode started today. Pertinent negatives include no abdominal pain, chest pain, chills, fever, headaches or neck pain.    Past Medical History  Diagnosis Date  . Complication of anesthesia   . PONV (postoperative nausea and vomiting)   . Hypertension   . Hyperlipidemia   . Swelling of extremity     sees Dr. Gwenlyn Found, cardiologist 1 x year [phone]  . Bronchitis 12/2011  . Hypothyroidism     takes synthroid  . GERD (gastroesophageal reflux disease)   . Neuromuscular disorder     carpal tunnel in right hand  . Insomnia   . Hyperlipidemia   . Skin cancer of face 2000's    "one on each side of my face and one on my nose" (08/13/2013)  . Diabetes mellitus type 2, diet-controlled   . History of blood transfusion     "w/both knee OR's" (08/13/2013)  . H/O hiatal hernia   . Arthritis     "probably" (08/13/2013)  . Atrial fibrillation     new onset/pt report 08/13/2013  .  Depression   . Anxiety attack     "take RX prn" (08/13/2013)   Past Surgical History  Procedure Laterality Date  . Total knee arthroplasty Right 2005  . Carpal tunnel release Right ~ 2005  . Eye surgery    . Breast biopsy Left ?1970's  . Cardiovascular stress test  12/07/11  . Doppler echocardiography  12/07/11  . Total knee arthroplasty  03/02/2012    Procedure: TOTAL KNEE ARTHROPLASTY;  Surgeon: Yvette Rack., MD;  Location: [facility];  Service: Orthopedics;  Laterality: Left;  . Joint replacement      bilateral knee replacement  . Cataract extraction, bilateral Bilateral 2000  . Skin cancer excision  2000's    "off my nose" (08/13/2013)  . Dilation and curettage of uterus  1950's  . Cardiovascular stress test  12/07/2011    LEXISCAN, normal scan, no significant wall abnormalities noted  . 2d echocardiogram  12/07/2004    EF 48%, moderate pulmonary hypertension   Family History  Problem Relation Age of Onset  . Arrhythmia Mother   . Heart disease Mother   . Arrhythmia Sister   . Arrhythmia Brother   . Heart disease Brother   . Stroke Father 21  . Heart disease Brother   . Heart disease Brother   . Cancer Brother     Colon cancer  . Heart disease Sister   .  Heart disease Sister   . Stroke Sister   . Diabetes Sister   . Heart disease Child 46  . Heart disease Child 28   Social History  Substance Use Topics  . Smoking status: Never Smoker   . Smokeless tobacco: Never Used  . Alcohol Use: No   OB History    No data available     Review of Systems  Constitutional: Negative for fever and chills.  Respiratory: Negative.  Negative for shortness of breath.   Cardiovascular: Negative.  Negative for chest pain.  Gastrointestinal: Negative.  Negative for abdominal pain.  Musculoskeletal: Negative for back pain and neck pain.       See HPI.  Skin: Positive for wound.  Neurological: Negative.  Negative for syncope and headaches.  Hematological: Bruises/bleeds easily.   Psychiatric/Behavioral: Negative for confusion.      Allergies  Morphine and related  Home Medications   Prior to Admission medications   Medication Sig Start Date End Date Taking? Authorizing Provider  acetaminophen (TYLENOL) 325 MG tablet Take 650 mg by mouth every 6 (six) hours as needed for pain.   Yes Historical Provider, MD  aspirin EC 81 MG tablet Take 81 mg by mouth daily.   Yes Historical Provider, MD  atorvastatin (LIPITOR) 80 MG tablet Take 80 mg by mouth daily.   Yes Historical Provider, MD  benazepril (LOTENSIN) 40 MG tablet Take 40 mg by mouth every evening.   Yes Historical Provider, MD  chlorthalidone (HYGROTON) 25 MG tablet TAKE 1 TABLET ONCE DAILY. 03/30/15  Yes Lorretta Harp, MD  diltiazem (CARDIZEM CD) 180 MG 24 hr capsule TAKE 1 CAPSULE DAILY. 05/25/15  Yes Lorretta Harp, MD  ELIQUIS 2.5 MG TABS tablet TAKE 1 TABLET TWICE DAILY. 06/17/15  Yes Lorretta Harp, MD  esomeprazole (NEXIUM) 20 MG capsule Take 20 mg by mouth daily at 12 noon.   Yes Historical Provider, MD  Flaxseed, Linseed, (FLAXSEED OIL PO) Take 1 tablet by mouth 2 (two) times daily.    Yes Historical Provider, MD  FLUoxetine (PROZAC) 20 MG capsule Take 1 capsule by mouth daily. 01/12/15  Yes Historical Provider, MD  furosemide (LASIX) 20 MG tablet Take 1 tablet (20 mg total) by mouth as needed for edema. Patient taking differently: Take 20 mg by mouth as needed for edema (gain 3LBS or more).  08/14/13  Yes Brett Canales, PA-C  levothyroxine (SYNTHROID, LEVOTHROID) 100 MCG tablet Take 100 mcg by mouth daily.    Yes Historical Provider, MD  metoprolol (LOPRESSOR) 50 MG tablet Take 50 mg by mouth 2 (two) times daily.   Yes Historical Provider, MD  Multiple Vitamins-Minerals (MACULAR VITAMIN BENEFIT PO) Take 1 tablet by mouth 2 (two) times daily.    Yes Historical Provider, MD  Polyethyl Glycol-Propyl Glycol (SYSTANE) 0.4-0.3 % SOLN Apply 1 drop to eye 3 (three) times daily as needed (dry eye).   Yes  Historical Provider, MD  potassium chloride (K-DUR) 10 MEQ tablet Take 1 tablet (10 mEq total) by mouth as needed. Patient taking differently: Take 10 mEq by mouth as needed (when taking lasix).  08/14/13  Yes Brett Canales, PA-C  PROBIOTIC CAPS Take 1 capsule by mouth daily as needed (diarrhea).    Yes Historical Provider, MD  temazepam (RESTORIL) 30 MG capsule Take 30 mg by mouth at bedtime as needed for sleep.   Yes Historical Provider, MD   BP 174/69 mmHg  Pulse 58  Resp 18  SpO2 92% Physical Exam  Constitutional: She is oriented to person, place, and time. She appears well-developed and well-nourished. No distress.  HENT:  Head: Normocephalic and atraumatic.  Hearing aids present.  Eyes: Conjunctivae are normal.  Neck: Normal range of motion. Neck supple.  Cardiovascular: Normal rate, regular rhythm and intact distal pulses.   No murmur heard. Pulmonary/Chest: Effort normal and breath sounds normal. She has no wheezes.  Abdominal: Soft. Bowel sounds are normal. There is no tenderness. There is no rebound and no guarding.  Musculoskeletal: Normal range of motion.  Right lower extremity shortened and externally rotated. FROM of distal LE. Right forearm has a deep abrasion coursing from dorsal wrist to lateral proximal arm with associated hematoma without bony deformity. FROM.   Neurological: She is alert and oriented to person, place, and time. Coordination normal.  Neurovascularly intact.  Skin: Skin is warm and dry. No rash noted.  Psychiatric: She has a normal mood and affect.    ED Course  Procedures (including critical care time) Labs Review Labs Reviewed  COMPREHENSIVE METABOLIC PANEL  CBC WITH DIFFERENTIAL/PLATELET  URINALYSIS, ROUTINE W REFLEX MICROSCOPIC (NOT AT [facility])  PROTIME-INR   Results for orders placed or performed during the hospital encounter of 07/30/15  MRSA PCR Screening  Result Value Ref Range   MRSA by PCR NEGATIVE NEGATIVE  Surgical pcr screen   Result Value Ref Range   MRSA, PCR NEGATIVE NEGATIVE   Staphylococcus aureus NEGATIVE NEGATIVE  Comprehensive metabolic panel  Result Value Ref Range   Sodium 136 135 - 145 mmol/L   Potassium 3.5 3.5 - 5.1 mmol/L   Chloride 96 (L) 101 - 111 mmol/L   CO2 30 22 - 32 mmol/L   Glucose, Bld 138 (H) 65 - 99 mg/dL   BUN 20 6 - 20 mg/dL   Creatinine, Ser 0.83 0.44 - 1.00 mg/dL   Calcium 10.0 8.9 - 10.3 mg/dL   Total Protein 7.2 6.5 - 8.1 g/dL   Albumin 4.3 3.5 - 5.0 g/dL   AST 25 15 - 41 U/L   ALT 21 14 - 54 U/L   Alkaline Phosphatase 104 38 - 126 U/L   Total Bilirubin 1.5 (H) 0.3 - 1.2 mg/dL   GFR calc non Af Amer >60 >60 mL/min   GFR calc Af Amer >60 >60 mL/min   Anion gap 10 5 - 15  CBC with Differential  Result Value Ref Range   WBC 12.4 (H) 4.0 - 10.5 K/uL   RBC 4.72 3.87 - 5.11 MIL/uL   Hemoglobin 14.5 12.0 - 15.0 g/dL   HCT 42.8 36.0 - 46.0 %   MCV 90.7 78.0 - 100.0 fL   MCH 30.7 26.0 - 34.0 pg   MCHC 33.9 30.0 - 36.0 g/dL   RDW 12.8 11.5 - 15.5 %   Platelets 187 150 - 400 K/uL   Neutrophils Relative % 82 (H) 43 - 77 %   Neutro Abs 10.2 (H) 1.7 - 7.7 K/uL   Lymphocytes Relative 12 12 - 46 %   Lymphs Abs 1.5 0.7 - 4.0 K/uL   Monocytes Relative 5 3 - 12 %   Monocytes Absolute 0.7 0.1 - 1.0 K/uL   Eosinophils Relative 1 0 - 5 %   Eosinophils Absolute 0.1 0.0 - 0.7 K/uL   Basophils Relative 0 0 - 1 %   Basophils Absolute 0.0 0.0 - 0.1 K/uL  Urinalysis, Routine w reflex microscopic (not at [facility])  Result Value Ref Range   Color, Urine YELLOW YELLOW   APPearance  CLEAR CLEAR   Specific Gravity, Urine 1.018 1.005 - 1.030   pH 7.5 5.0 - 8.0   Glucose, UA NEGATIVE NEGATIVE mg/dL   Hgb urine dipstick NEGATIVE NEGATIVE   Bilirubin Urine NEGATIVE NEGATIVE   Ketones, ur NEGATIVE NEGATIVE mg/dL   Protein, ur NEGATIVE NEGATIVE mg/dL   Urobilinogen, UA 1.0 0.0 - 1.0 mg/dL   Nitrite NEGATIVE NEGATIVE   Leukocytes, UA NEGATIVE NEGATIVE  Protime-INR  Result Value Ref Range    Prothrombin Time 14.6 11.6 - 15.2 seconds   INR 1.12 0.00 - 1.49  CBC  Result Value Ref Range   WBC 11.9 (H) 4.0 - 10.5 K/uL   RBC 4.31 3.87 - 5.11 MIL/uL   Hemoglobin 13.3 12.0 - 15.0 g/dL   HCT 39.4 36.0 - 46.0 %   MCV 91.4 78.0 - 100.0 fL   MCH 30.9 26.0 - 34.0 pg   MCHC 33.8 30.0 - 36.0 g/dL   RDW 13.1 11.5 - 15.5 %   Platelets 189 150 - 400 K/uL  Basic metabolic panel  Result Value Ref Range   Sodium 134 (L) 135 - 145 mmol/L   Potassium 3.5 3.5 - 5.1 mmol/L   Chloride 94 (L) 101 - 111 mmol/L   CO2 33 (H) 22 - 32 mmol/L   Glucose, Bld 137 (H) 65 - 99 mg/dL   BUN 18 6 - 20 mg/dL   Creatinine, Ser 0.79 0.44 - 1.00 mg/dL   Calcium 9.6 8.9 - 10.3 mg/dL   GFR calc non Af Amer >60 >60 mL/min   GFR calc Af Amer >60 >60 mL/min   Anion gap 7 5 - 15  Glucose, capillary  Result Value Ref Range   Glucose-Capillary 138 (H) 65 - 99 mg/dL  Glucose, capillary  Result Value Ref Range   Glucose-Capillary 120 (H) 65 - 99 mg/dL  Glucose, capillary  Result Value Ref Range   Glucose-Capillary 155 (H) 65 - 99 mg/dL  CBC  Result Value Ref Range   WBC 11.4 (H) 4.0 - 10.5 K/uL   RBC 3.13 (L) 3.87 - 5.11 MIL/uL   Hemoglobin 9.8 (L) 12.0 - 15.0 g/dL   HCT 29.2 (L) 36.0 - 46.0 %   MCV 93.3 78.0 - 100.0 fL   MCH 31.3 26.0 - 34.0 pg   MCHC 33.6 30.0 - 36.0 g/dL   RDW 13.6 11.5 - 15.5 %   Platelets 129 (L) 150 - 400 K/uL  Basic metabolic panel  Result Value Ref Range   Sodium 131 (L) 135 - 145 mmol/L   Potassium 3.8 3.5 - 5.1 mmol/L   Chloride 92 (L) 101 - 111 mmol/L   CO2 33 (H) 22 - 32 mmol/L   Glucose, Bld 125 (H) 65 - 99 mg/dL   BUN 24 (H) 6 - 20 mg/dL   Creatinine, Ser 1.15 (H) 0.44 - 1.00 mg/dL   Calcium 8.9 8.9 - 10.3 mg/dL   GFR calc non Af Amer 41 (L) >60 mL/min   GFR calc Af Amer 47 (L) >60 mL/min   Anion gap 6 5 - 15  Osmolality  Result Value Ref Range   Osmolality 281 275 - 300 mOsm/kg  Sodium, urine, random  Result Value Ref Range   Sodium, Ur <10 mmol/L  Uric acid   Result Value Ref Range   Uric Acid, Serum 5.1 2.3 - 6.6 mg/dL  Urinalysis, Routine w reflex microscopic (not at [facility])  Result Value Ref Range   Color, Urine YELLOW YELLOW   APPearance CLEAR  CLEAR   Specific Gravity, Urine 1.019 1.005 - 1.030   pH 5.0 5.0 - 8.0   Glucose, UA NEGATIVE NEGATIVE mg/dL   Hgb urine dipstick NEGATIVE NEGATIVE   Bilirubin Urine NEGATIVE NEGATIVE   Ketones, ur NEGATIVE NEGATIVE mg/dL   Protein, ur NEGATIVE NEGATIVE mg/dL   Urobilinogen, UA 0.2 0.0 - 1.0 mg/dL   Nitrite NEGATIVE NEGATIVE   Leukocytes, UA NEGATIVE NEGATIVE  Osmolality, urine  Result Value Ref Range   Osmolality, Ur 501 390 - 1090 mOsm/kg  Creatinine, urine, random  Result Value Ref Range   Creatinine, Urine 163.62 mg/dL     Imaging Review Dg Chest 1 View  07/30/2015   CLINICAL DATA:  Status post fall today.  Hip pain.  EXAM: CHEST  1 VIEW  COMPARISON:  PA and lateral chest 08/03/2013.  CT chest 04/27/2013.  FINDINGS: The lungs are clear. Heart size is normal. No pneumothorax or pleural effusion. No focal bony abnormality is seen. Aortic atherosclerosis is noted.  IMPRESSION: No acute disease.   Electronically Signed   By: Inge Rise M.D.   On: 07/30/2015 15:22   Dg Forearm Right  07/30/2015   CLINICAL DATA:  Patient fell earlier today landing on right arm.  EXAM: RIGHT FOREARM - 2 VIEW  COMPARISON:  None.  FINDINGS: Frontal and lateral views were obtained. There are areas of soft tissue prominence dorsally, most likely representing soft tissue hematomas. No acute fracture or dislocation is apparent. There is extensive calcification in the radiocarpal joint. There is extensive osteoarthritic change in the scaphotrapezial and first carpal -metacarpal joints. There are foci of arterial vascular calcification.  IMPRESSION: No acute fracture or dislocation. Soft tissue hematomas dorsally. 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 Foci of arterial vascular calcification, primarily in  the ulnar artery.   Electronically Signed   By: Lowella Grip III M.D.   On: 07/30/2015 15:20   Dg Knee Complete 4 Views Right  07/30/2015   CLINICAL DATA:  Fall today. Right hip pain. Acute intertrochanteric fracture of the right hip.  EXAM: RIGHT KNEE - COMPLETE 4+ VIEW  COMPARISON:  09/29/2004  FINDINGS: Right total knee prosthesis observed without visible fracture in the vicinity of the knee. No hardware complication identified.  IMPRESSION: 1. Total knee prosthesis, without complicating feature observed.   Electronically Signed   By: Van Clines M.D.   On: 07/30/2015 15:21   Dg Hips Bilat With Pelvis 3-4 Views  07/30/2015   CLINICAL DATA:  Right hip pain and limited range of motion after falling when walking into a restaurant today.  EXAM: DG HIP (WITH OR WITHOUT PELVIS) 3-4V BILAT  COMPARISON:  None.  FINDINGS: Comminuted right intertrochanteric fracture with varus angulation and mild distraction of the major fragments. Mild left hip degenerative changes. Lower lumbar spine degenerative changes.  IMPRESSION: 1. Comminuted right intertrochanteric fracture with varus angulation. 2. Mild left hip and lower lumbar spine degenerative changes.   Electronically Signed   By: Claudie Revering M.D.   On: 07/30/2015 15:19   I have personally reviewed and evaluated these images and lab results as part of my medical decision-making.   EKG Interpretation None      MDM   Final diagnoses:  Fall  Right hip fracture, intertrochanteric Right forearm abrasion.   The patient is in NAD, is completely oriented and comfortable. She has had surgery in the past by Dr. French Ana who will be consulted for current right hip fracture. Will admit to medicine ([facility]) per hip  fracture protocol. VSS. EKG showing NSR, no current a-fib.      Charlann Lange, PA-C 08/03/15 [place], MD 08/08/15 [phone]

## 2015-07-30 NOTE — H&P (Signed)
[facility] History and Physical  DARRYL BLUMENSTEIN DGU:[id number] DOB: 04-Aug-1922 DOA: 07/30/2015  Referring physician: ED-[place], PA PCP: Florina Ou, MD  Specialists:  Ortho consulted by ED  81 y/o ? P Afib with some bradycardia since 08/03/13 Chad2Vasc2 > 4 on elliquis Htn Hld Dm ty 2 on  Gerd Insomnia Bipolar myoview stress 8/26/12low risk S/p Multiple knee surgeries Dr. French Ana  Very independent still driving and doing complete IADLs at home without assistance  Sr. passed away 08/20/23. She was with family going to new restaurant [employer] at around 1:00 this afternoon after the service with family members and stepped wrong with her high heel shoes into one of the cracks on the pavement and fell backward onto her right forearm as well as onto her right hip. She sustained immediate pain. She did not have any syncope or presyncopal episodes, did not feel weak and did not feel any chest pain or shortness of breath or any other issues at that time This was purely a mechanical fall  She states that her pain is currently 5/10  - Chills, - fever, - rash, - melanoma, - hematemesis, - cough, - recent fall, - blurred vision, recent double vision, - chest pain, - other symptoms she does have headache however   Past Medical History  Diagnosis Date  . Complication of anesthesia   . PONV (postoperative nausea and vomiting)   . Hypertension   . Hyperlipidemia   . Swelling of extremity     sees Dr. Gwenlyn Found, cardiologist 1 x year [phone]  . Bronchitis 12/2011  . Hypothyroidism     takes synthroid  . GERD (gastroesophageal reflux disease)   . Neuromuscular disorder     carpal tunnel in right hand  . Insomnia   . Hyperlipidemia   . Skin cancer of face 2000's    "one on each side of my face and one on my nose" (08/13/2013)  . Diabetes mellitus type 2, diet-controlled   . History of blood transfusion     "w/both knee OR's" (08/13/2013)  . H/O hiatal hernia   . Arthritis      "probably" (08/13/2013)  . Atrial fibrillation     new onset/pt report 08/13/2013  . Depression   . Anxiety attack     "take RX prn" (08/13/2013)   Past Surgical History  Procedure Laterality Date  . Total knee arthroplasty Right 2005  . Carpal tunnel release Right ~ 2005  . Eye surgery    . Breast biopsy Left ?1970's  . Cardiovascular stress test  12/07/11  . Doppler echocardiography  12/07/11  . Total knee arthroplasty  03/02/2012    Procedure: TOTAL KNEE ARTHROPLASTY;  Surgeon: Yvette Rack., MD;  Location: [facility];  Service: Orthopedics;  Laterality: Left;  . Joint replacement      bilateral knee replacement  . Cataract extraction, bilateral Bilateral 2000  . Skin cancer excision  2000's    "off my nose" (08/13/2013)  . Dilation and curettage of uterus  1950's  . Cardiovascular stress test  12/07/2011    LEXISCAN, normal scan, no significant wall abnormalities noted  . 2d echocardiogram  12/07/2004    EF 48%, moderate pulmonary hypertension   Social History:  Social History   Social History Narrative    Allergies  Allergen Reactions  . Morphine And Related Other (See Comments)    Made pt "wild"    Family History  Problem Relation Age of Onset  . Arrhythmia Mother   .  Heart disease Mother   . Arrhythmia Sister   . Arrhythmia Brother   . Heart disease Brother   . Stroke Father 45  . Heart disease Brother   . Heart disease Brother   . Cancer Brother     Colon cancer  . Heart disease Sister   . Heart disease Sister   . Stroke Sister   . Diabetes Sister   . Heart disease Child 60  . Heart disease Child 43    Prior to Admission medications   Medication Sig Start Date End Date Taking? Authorizing Provider  acetaminophen (TYLENOL) 325 MG tablet Take 650 mg by mouth every 6 (six) hours as needed for pain.   Yes Historical Provider, MD  aspirin EC 81 MG tablet Take 81 mg by mouth daily.   Yes Historical Provider, MD  atorvastatin (LIPITOR) 80 MG tablet Take 80 mg  by mouth daily.   Yes Historical Provider, MD  benazepril (LOTENSIN) 40 MG tablet Take 40 mg by mouth every evening.   Yes Historical Provider, MD  chlorthalidone (HYGROTON) 25 MG tablet TAKE 1 TABLET ONCE DAILY. 03/30/15  Yes Lorretta Harp, MD  diltiazem (CARDIZEM CD) 180 MG 24 hr capsule TAKE 1 CAPSULE DAILY. 05/25/15  Yes Lorretta Harp, MD  ELIQUIS 2.5 MG TABS tablet TAKE 1 TABLET TWICE DAILY. 06/17/15  Yes Lorretta Harp, MD  esomeprazole (NEXIUM) 20 MG capsule Take 20 mg by mouth daily at 12 noon.   Yes Historical Provider, MD  Flaxseed, Linseed, (FLAXSEED OIL PO) Take 1 tablet by mouth 2 (two) times daily.    Yes Historical Provider, MD  FLUoxetine (PROZAC) 20 MG capsule Take 1 capsule by mouth daily. 01/12/15  Yes Historical Provider, MD  furosemide (LASIX) 20 MG tablet Take 1 tablet (20 mg total) by mouth as needed for edema. Patient taking differently: Take 20 mg by mouth as needed for edema (gain 3LBS or more).  08/14/13  Yes Brett Canales, PA-C  levothyroxine (SYNTHROID, LEVOTHROID) 100 MCG tablet Take 100 mcg by mouth daily.    Yes Historical Provider, MD  metoprolol (LOPRESSOR) 50 MG tablet Take 50 mg by mouth 2 (two) times daily.   Yes Historical Provider, MD  Multiple Vitamins-Minerals (MACULAR VITAMIN BENEFIT PO) Take 1 tablet by mouth 2 (two) times daily.    Yes Historical Provider, MD  Polyethyl Glycol-Propyl Glycol (SYSTANE) 0.4-0.3 % SOLN Apply 1 drop to eye 3 (three) times daily as needed (dry eye).   Yes Historical Provider, MD  potassium chloride (K-DUR) 10 MEQ tablet Take 1 tablet (10 mEq total) by mouth as needed. Patient taking differently: Take 10 mEq by mouth as needed (when taking lasix).  08/14/13  Yes Brett Canales, PA-C  PROBIOTIC CAPS Take 1 capsule by mouth daily as needed (diarrhea).    Yes Historical Provider, MD  temazepam (RESTORIL) 30 MG capsule Take 30 mg by mouth at bedtime as needed for sleep.   Yes Historical Provider, MD   Physical Exam: Filed Vitals:    07/30/15 1526 07/30/15 1602  BP: 174/69 177/68  Pulse: 58 62  Resp: 18 17  SpO2: 92% 95%   EOMI NCAT no pallor no icterus pleasant euthymic No JVD, no bruit, S1-S2 regular rate rhythm with no murmur Abdomen soft nontender nondistended no rebound Her right arm has a 10 cm laceration with losing however disc closed by primary intention and there is a hematoma over the glucagon process as well as over the radial process it is  not painful to her however Her right lower extremity is externally rotated   Labs on Admission:  Basic Metabolic Panel:  Recent Labs Lab 07/30/15 1539  NA 136  K 3.5  CL 96*  CO2 30  GLUCOSE 138*  BUN 20  CREATININE 0.83  CALCIUM 10.0   Liver Function Tests:  Recent Labs Lab 07/30/15 1539  AST 25  ALT 21  ALKPHOS 104  BILITOT 1.5*  PROT 7.2  ALBUMIN 4.3   No results for input(s): LIPASE, AMYLASE in the last 168 hours. No results for input(s): AMMONIA in the last 168 hours. CBC:  Recent Labs Lab 07/30/15 1539  WBC 12.4*  NEUTROABS 10.2*  HGB 14.5  HCT 42.8  MCV 90.7  PLT 187   Cardiac Enzymes: No results for input(s): CKTOTAL, CKMB, CKMBINDEX, TROPONINI in the last 168 hours.  BNP (last 3 results) No results for input(s): BNP in the last 8760 hours.  ProBNP (last 3 results) No results for input(s): PROBNP in the last 8760 hours.  CBG: No results for input(s): GLUCAP in the last 168 hours.  Radiological Exams on Admission: Dg Chest 1 View  07/30/2015   CLINICAL DATA:  Status post fall today.  Hip pain.  EXAM: CHEST  1 VIEW  COMPARISON:  PA and lateral chest 08/03/2013.  CT chest 04/27/2013.  FINDINGS: The lungs are clear. Heart size is normal. No pneumothorax or pleural effusion. No focal bony abnormality is seen. Aortic atherosclerosis is noted.  IMPRESSION: No acute disease.   Electronically Signed   By: Inge Rise M.D.   On: 07/30/2015 15:22   Dg Forearm Right  07/30/2015   CLINICAL DATA:  Patient fell earlier today  landing on right arm.  EXAM: RIGHT FOREARM - 2 VIEW  COMPARISON:  None.  FINDINGS: Frontal and lateral views were obtained. There are areas of soft tissue prominence dorsally, most likely representing soft tissue hematomas. No acute fracture or dislocation is apparent. There is extensive calcification in the radiocarpal joint. There is extensive osteoarthritic change in the scaphotrapezial and first carpal -metacarpal joints. There are foci of arterial vascular calcification.  IMPRESSION: No acute fracture or dislocation. Soft tissue hematomas dorsally. 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 Foci of arterial vascular calcification, primarily in the ulnar artery.   Electronically Signed   By: Lowella Grip III M.D.   On: 07/30/2015 15:20   Dg Knee Complete 4 Views Right  07/30/2015   CLINICAL DATA:  Fall today. Right hip pain. Acute intertrochanteric fracture of the right hip.  EXAM: RIGHT KNEE - COMPLETE 4+ VIEW  COMPARISON:  09/29/2004  FINDINGS: Right total knee prosthesis observed without visible fracture in the vicinity of the knee. No hardware complication identified.  IMPRESSION: 1. Total knee prosthesis, without complicating feature observed.   Electronically Signed   By: Van Clines M.D.   On: 07/30/2015 15:21   Dg Hips Bilat With Pelvis 3-4 Views  07/30/2015   CLINICAL DATA:  Right hip pain and limited range of motion after falling when walking into a restaurant today.  EXAM: DG HIP (WITH OR WITHOUT PELVIS) 3-4V BILAT  COMPARISON:  None.  FINDINGS: Comminuted right intertrochanteric fracture with varus angulation and mild distraction of the major fragments. Mild left hip degenerative changes. Lower lumbar spine degenerative changes.  IMPRESSION: 1. Comminuted right intertrochanteric fracture with varus angulation. 2. Mild left hip and lower lumbar spine degenerative changes.   Electronically Signed   By: Claudie Revering M.D.   On: 07/30/2015  15:19    EKG: Independently  reviewed. Rate controlled  Assessment/Plan Active Problems:   Hypertension   Cataract   Hypothyroidism   Insomnia   Right carotid bruit   Paroxysmal atrial fibrillation   Coagulopathy   Skin cancer of face   Hip fracture   Complication of anesthesia   Right hip fracture -Dr. French Ana of orthopedics has been consulted and will evaluate the patient for potential repair -Elliquis will wash out ~ 24 hours so patient does not need anticoagulation at present but should get SCDs  -She is high functioning and performs at least 3 met with of exercise daily-Risk of surgery is not prohibitive but is moderate risk  1. per discretion Ortho services-appreciate their input into   Anticoagulation post-op  Weight bearing tolerance for therapy services  Wound care  Pain management  Follow-up services required  Paroxysmal atrial fibrillation, [place] score above 4 on anticoagulation -Continue home medications of Cardizem 180 every 24, metoprolol 50 -She is currently in sinus rhythm with sinus and will need to be on telemetry  Hypertension -Continue chlorthalidone 25 daily -Continue Lotensin 40 daily  Diabetes mellitus type 2- Appears to be diet-controlled? Not on any medication currently Cover her sugars if they go above 180 with sliding scale otherwise will not check aggressively  Bipolar with insomnia -Continue fluoxetine 20 daily -Continue temazepam 30 daily at bedtime when necessary bedtime  Hyperlipidemia -Continue Lipitor 80 daily high intensity dosing -Continue flaxseed oil  Lower extremity swelling -Patient takes when necessary Lasix we will continue the same

## 2015-07-30 NOTE — ED Notes (Addendum)
Per EMS - patient comes from a local restaurant after falling at a [employer].  Patient got her shoe stuck in a hole on the sidewalk.  Patient fell on her right side, striking hip against walk.  Patient c/o right hip pain.  No obvious rotation noted.  Patient also scraped her right arm on sidewalk as well.  She has small skin tear to right forearm.  Bleeding controlled.  Vitals on scene, 164/77, HR 60, RR 20.  CBG 122.  Patient in on Eliquis for a-fib.  Patient denies LOC or hitting her head during fall.

## 2015-07-30 NOTE — Progress Notes (Signed)
PHARMACIST - PHYSICIAN ORDER COMMUNICATION  CONCERNING: P&T Medication Policy on Herbal Medications  DESCRIPTION:  This patient's order for:  Flaxseed Oil  has been noted.  This product(s) is classified as an "herbal" or natural product. Due to a lack of definitive safety studies or FDA approval, nonstandard manufacturing practices, plus the potential risk of unknown drug-drug interactions while on inpatient medications, the Pharmacy and Therapeutics Committee does not permit the use of "herbal" or natural products of this type within [facility].   ACTION TAKEN: The pharmacy department is unable to verify this order at this time and your patient has been informed of this safety policy. Please reevaluate patient's clinical condition at discharge and address if the herbal or natural product(s) should be resumed at that time.   

## 2015-07-30 NOTE — Progress Notes (Signed)
Spoke to Dr Cherie Dark was to go to [employer] for [employer]. Awaiting Orders

## 2015-07-30 NOTE — ED Provider Notes (Signed)
MSE was initiated and I personally evaluated the patient and placed orders (if any) at  2:39 PM on July 30, 2015.    HPI: Patient is a pleasant 81 year old female who presents after sustaining a fall while walking into a restaurant. Transported via EMS.  She states she did no hit her head and had no LOC.  She states she landed on her right arm as well as her right hip.  Pain in right forearm and right hip with limited movement.  Denies vomiting, numbness, or tingling.  Physical Exam: General: Alert and no acute distress HEENT: atraumatic Respiratory: normal effort, no retractions, no use of accessory muscles Cardiac: regular rate and rhythm Abdominal: nondistended, soft Neuro: no gross deficit Musculoskeletal: Palpable pulse on right, capillary refill less than 3 seconds, foot is warm.  Full range of motion with dorsiflexion and plantar flexion.  Full range of motion with flexion and extension of right wrist and elbow.  Skin: Moderate hematoma on right wrist.  Multiple abrasions to the right arm. Psych: normal speech, appropriate mood and affect        Plan: -Right forearm, hip, and knee xrays ordered and results pending.  CXR for possible surgical clearance.  -50 mcg fentanyl ordered for pain management -Zofran ordered for vague nausea -Unable to visualize right hip and knee, patient is still clothed and in hallway bed.  Re-exam once in room.   The patient appears stable so that the remainder of the MSE may be completed by another provider.  Gloriann Loan, [place] 07/30/15 1507  Charlesetta Shanks, MD 08/04/15 1726

## 2015-07-30 NOTE — ED Notes (Signed)
Patient states she fell outside of a local restaurant earlier after catching her foot in an indentation on the sidewalk.  Patient fell, landing on her right side.  Patient scraped her right elbow and struck right hip on walk.  Patient presents with some external rotation of right leg.  +2 pulse palpated to RLE.  Patient denies hitting head and LOC.  Patient denies dizziness.  Patient states she was mildly nauseated earlier, but denies currently.

## 2015-07-30 NOTE — Progress Notes (Signed)
81 yr old female Leah Holland pt Living a friends home after sustaining a fall while walking into a restaurant. Transported via EMS. She states she did not hit her head and had no LOC. She states she landed on her right arm as well as her right hip. Pain in right forearm and right hip with limited movement.  CM spoke with pt to see what DME she has Pt confirms having a cane and a borrowed standard walker at home Pt interested in a rolator

## 2015-07-30 NOTE — Consult Note (Signed)
Reason for Consult:R intertroch hip fracture Referring Physician: D[facility]  Leah Holland is an 81 y.o. female.  HPI: Very pleasant 65-yo patient history of knee replacement with Dr. French Ana, suffering a mechanical fall just PTA after tripping on a curb while stepping up onto it. She fell onto her right side hitting her forearm and hip. She denies hitting her head and there was no LOC. She denies abdominal/chest/neck pain. There is a right forearm wound with swelling but no other lacerations/abrasions. She complains of right forearm and hip pain only. She has a history of atrial fibrillation on Eliquis diagnosed within the last year, HTN, GERD, T2DM (diet controlled) and has had a right knee replacement (2013, Dr. French Ana).   Hip Pain This is a new problem. The current episode started today. Pertinent negatives include no abdominal pain, chest pain, chills, fever, headaches or neck pain.   Past Medical History  Diagnosis Date  . Complication of anesthesia   . PONV (postoperative nausea and vomiting)   . Hypertension   . Hyperlipidemia   . Swelling of extremity     sees Dr. Gwenlyn Found, cardiologist 1 x year [phone]  . Bronchitis 12/2011  . Hypothyroidism     takes synthroid  . GERD (gastroesophageal reflux disease)   . Neuromuscular disorder     carpal tunnel in right hand  . Insomnia   . Hyperlipidemia   . Skin cancer of face 2000's    "one on each side of my face and one on my nose" (08/13/2013)  . Diabetes mellitus type 2, diet-controlled   . History of blood transfusion     "w/both knee OR's" (08/13/2013)  . H/O hiatal hernia   . Arthritis     "probably" (08/13/2013)  . Atrial fibrillation     new onset/pt report 08/13/2013  . Depression   . Anxiety attack     "take RX prn" (08/13/2013)    Past Surgical History  Procedure Laterality Date  . Total knee arthroplasty Right 2005  . Carpal tunnel release Right ~ 2005  . Eye surgery    . Breast biopsy Left ?1970's  . Cardiovascular  stress test  12/07/11  . Doppler echocardiography  12/07/11  . Total knee arthroplasty  03/02/2012    Procedure: TOTAL KNEE ARTHROPLASTY;  Surgeon: Yvette Rack., MD;  Location: [facility];  Service: Orthopedics;  Laterality: Left;  . Joint replacement      bilateral knee replacement  . Cataract extraction, bilateral Bilateral 2000  . Skin cancer excision  2000's    "off my nose" (08/13/2013)  . Dilation and curettage of uterus  1950's  . Cardiovascular stress test  12/07/2011    LEXISCAN, normal scan, no significant wall abnormalities noted  . 2d echocardiogram  12/07/2004    EF 48%, moderate pulmonary hypertension    Family History  Problem Relation Age of Onset  . Arrhythmia Mother   . Heart disease Mother   . Arrhythmia Sister   . Arrhythmia Brother   . Heart disease Brother   . Stroke Father 41  . Heart disease Brother   . Heart disease Brother   . Cancer Brother     Colon cancer  . Heart disease Sister   . Heart disease Sister   . Stroke Sister   . Diabetes Sister   . Heart disease Child 51  . Heart disease Child 33    Social History:  reports that she has never smoked. She has never used smokeless tobacco.  She reports that she does not drink alcohol or use illicit drugs.  Allergies:  Allergies  Allergen Reactions  . Morphine And Related Other (See Comments)    Made pt "wild"    Medications: I have reviewed the patient's current medications.  Results for orders placed or performed during the hospital encounter of 07/30/15 (from the past 48 hour(s))  Comprehensive metabolic panel     Status: Abnormal   Collection Time: 07/30/15  3:39 PM  Result Value Ref Range   Sodium 136 135 - 145 mmol/L   Potassium 3.5 3.5 - 5.1 mmol/L   Chloride 96 (L) 101 - 111 mmol/L   CO2 30 22 - 32 mmol/L   Glucose, Bld 138 (H) 65 - 99 mg/dL   BUN 20 6 - 20 mg/dL   Creatinine, Ser 0.83 0.44 - 1.00 mg/dL   Calcium 10.0 8.9 - 10.3 mg/dL   Total Protein 7.2 6.5 - 8.1 g/dL   Albumin 4.3  3.5 - 5.0 g/dL   AST 25 15 - 41 U/L   ALT 21 14 - 54 U/L   Alkaline Phosphatase 104 38 - 126 U/L   Total Bilirubin 1.5 (H) 0.3 - 1.2 mg/dL   GFR calc non Af Amer >60 >60 mL/min   GFR calc Af Amer >60 >60 mL/min    Comment: (NOTE) The eGFR has been calculated using the CKD EPI equation. This calculation has not been validated in all clinical situations. eGFR's persistently <60 mL/min signify possible Chronic Kidney Disease.    Anion gap 10 5 - 15  CBC with Differential     Status: Abnormal   Collection Time: 07/30/15  3:39 PM  Result Value Ref Range   WBC 12.4 (H) 4.0 - 10.5 K/uL   RBC 4.72 3.87 - 5.11 MIL/uL   Hemoglobin 14.5 12.0 - 15.0 g/dL   HCT 42.8 36.0 - 46.0 %   MCV 90.7 78.0 - 100.0 fL   MCH 30.7 26.0 - 34.0 pg   MCHC 33.9 30.0 - 36.0 g/dL   RDW 12.8 11.5 - 15.5 %   Platelets 187 150 - 400 K/uL   Neutrophils Relative % 82 (H) 43 - 77 %   Neutro Abs 10.2 (H) 1.7 - 7.7 K/uL   Lymphocytes Relative 12 12 - 46 %   Lymphs Abs 1.5 0.7 - 4.0 K/uL   Monocytes Relative 5 3 - 12 %   Monocytes Absolute 0.7 0.1 - 1.0 K/uL   Eosinophils Relative 1 0 - 5 %   Eosinophils Absolute 0.1 0.0 - 0.7 K/uL   Basophils Relative 0 0 - 1 %   Basophils Absolute 0.0 0.0 - 0.1 K/uL  Protime-INR     Status: None   Collection Time: 07/30/15  3:51 PM  Result Value Ref Range   Prothrombin Time 14.6 11.6 - 15.2 seconds   INR 1.12 0.00 - 1.49    Dg Chest 1 View  07/30/2015   CLINICAL DATA:  Status post fall today.  Hip pain.  EXAM: CHEST  1 VIEW  COMPARISON:  PA and lateral chest 08/03/2013.  CT chest 04/27/2013.  FINDINGS: The lungs are clear. Heart size is normal. No pneumothorax or pleural effusion. No focal bony abnormality is seen. Aortic atherosclerosis is noted.  IMPRESSION: No acute disease.   Electronically Signed   By: Inge Rise M.D.   On: 07/30/2015 15:22   Dg Forearm Right  07/30/2015   CLINICAL DATA:  Patient fell earlier today landing on right arm.  EXAM: RIGHT FOREARM - 2  VIEW  COMPARISON:  None.  FINDINGS: Frontal and lateral views were obtained. There are areas of soft tissue prominence dorsally, most likely representing soft tissue hematomas. No acute fracture or dislocation is apparent. There is extensive calcification in the radiocarpal joint. There is extensive osteoarthritic change in the scaphotrapezial and first carpal -metacarpal joints. There are foci of arterial vascular calcification.  IMPRESSION: No acute fracture or dislocation. Soft tissue hematomas dorsally. 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 Foci of arterial vascular calcification, primarily in the ulnar artery.   Electronically Signed   By: Lowella Grip III M.D.   On: 07/30/2015 15:20   Dg Knee Complete 4 Views Right  07/30/2015   CLINICAL DATA:  Fall today. Right hip pain. Acute intertrochanteric fracture of the right hip.  EXAM: RIGHT KNEE - COMPLETE 4+ VIEW  COMPARISON:  09/29/2004  FINDINGS: Right total knee prosthesis observed without visible fracture in the vicinity of the knee. No hardware complication identified.  IMPRESSION: 1. Total knee prosthesis, without complicating feature observed.   Electronically Signed   By: Van Clines M.D.   On: 07/30/2015 15:21   Dg Hips Bilat With Pelvis 3-4 Views  07/30/2015   CLINICAL DATA:  Right hip pain and limited range of motion after falling when walking into a restaurant today.  EXAM: DG HIP (WITH OR WITHOUT PELVIS) 3-4V BILAT  COMPARISON:  None.  FINDINGS: Comminuted right intertrochanteric fracture with varus angulation and mild distraction of the major fragments. Mild left hip degenerative changes. Lower lumbar spine degenerative changes.  IMPRESSION: 1. Comminuted right intertrochanteric fracture with varus angulation. 2. Mild left hip and lower lumbar spine degenerative changes.   Electronically Signed   By: Claudie Revering M.D.   On: 07/30/2015 15:19    Review of Systems  Constitutional: Negative for fever and chills.   HENT: Negative for congestion, nosebleeds and sore throat.   Eyes: Negative for blurred vision and double vision.  Respiratory: Negative.  Negative for cough, hemoptysis, sputum production, shortness of breath and wheezing.   Cardiovascular: Negative for chest pain, palpitations, orthopnea and leg swelling.  Gastrointestinal: Positive for nausea. Negative for vomiting and abdominal pain.  Genitourinary: Negative for dysuria and hematuria.  Musculoskeletal: Positive for myalgias, joint pain and falls.  Skin: Negative.   Neurological: Negative for dizziness, tingling, sensory change, seizures, loss of consciousness and headaches.  Endo/Heme/Allergies: Bruises/bleeds easily.  Psychiatric/Behavioral: Negative.    Blood pressure 184/72, pulse 66, resp. rate 17, SpO2 98 %. Physical Exam  Constitutional: She is oriented to person, place, and time. She appears well-developed and well-nourished. No distress.  HENT:  Head: Normocephalic and atraumatic.  Nose: Nose normal.  Eyes: Conjunctivae and EOM are normal. Pupils are equal, round, and reactive to light.  Neck: Normal range of motion. Neck supple.  Cardiovascular: Intact distal pulses.  Bradycardia present.   Respiratory: Effort normal.  GI: Soft. She exhibits no distension. There is no tenderness.  Musculoskeletal:  RUE- NVI, TTP right forearm, abrasions/bruising/hematomas present, Wrist ROM intact without pain, good grip strength, shoulder/elbow exam no acute findings.    RLE- NVI, strong distal pulses, no calf TTP, dorsiflexion/plantarflexion ankle intact without pain, knee no acute findings, leg is slightly shortened and externally rotated, slight ecchymosis post hip which is TTP.  C-spine, LUE, LLE- good ROM no TTP, no c/o pain  Lymphadenopathy:    She has no cervical adenopathy.  Neurological: She is alert and oriented to person, place, and time.  Skin: Skin is warm and dry. Abrasion, bruising and ecchymosis noted. No rash noted. No  erythema.  Abrasions/bruising to right forearm, which was cleaned and dressed, hematomas present  Psychiatric: She has a normal mood and affect. Her behavior is normal.    Assessment/Plan: Right 3 part intertrochanteric hip fracture following a fall today  Admitted to med per hip fracture protocol NWB, bedrest SCD's for dvt proph, hold eliquis, will likely restart after surgery  Will plan on transfer to [facility] hospital for surgery possibly tomorrow or Sat. once she has been adequately off eliquis (likely right hip trochanteric IM femoral nail.)  Risks and benefits of surgery discussed and patient wishes to proceed as soon as possible  Pain med as ordered prn Patient discussed with Dr. French Ana as well as Dr. [name] who is on call for our group this weekend.   NPO after midnight tonight for potential surgery tomorrow Discharge planning:  Lives at [facility] she feels there may be rehab available there, she was sent to [facility] for short stay following her last knee replacement in 2013.  Chriss Czar 07/30/2015, 5:20 PM     Patient seen and agree with above  Johnny Bridge, MD 7:40 PM 07/31/2015

## 2015-07-31 DIAGNOSIS — C44310 Basal cell carcinoma of skin of unspecified parts of face: Secondary | ICD-10-CM

## 2015-07-31 DIAGNOSIS — H269 Unspecified cataract: Secondary | ICD-10-CM

## 2015-07-31 LAB — MRSA PCR SCREENING: MRSA BY PCR: NEGATIVE

## 2015-07-31 LAB — CBC
HCT: 39.4 % (ref 36.0–46.0)
HEMOGLOBIN: 13.3 g/dL (ref 12.0–15.0)
MCH: 30.9 pg (ref 26.0–34.0)
MCHC: 33.8 g/dL (ref 30.0–36.0)
MCV: 91.4 fL (ref 78.0–100.0)
Platelets: 189 10*3/uL (ref 150–400)
RBC: 4.31 MIL/uL (ref 3.87–5.11)
RDW: 13.1 % (ref 11.5–15.5)
WBC: 11.9 10*3/uL — ABNORMAL HIGH (ref 4.0–10.5)

## 2015-07-31 LAB — BASIC METABOLIC PANEL
Anion gap: 7 (ref 5–15)
BUN: 18 mg/dL (ref 6–20)
CHLORIDE: 94 mmol/L — AB (ref 101–111)
CO2: 33 mmol/L — ABNORMAL HIGH (ref 22–32)
Calcium: 9.6 mg/dL (ref 8.9–10.3)
Creatinine, Ser: 0.79 mg/dL (ref 0.44–1.00)
GFR calc Af Amer: >60 mL/min (ref >60)
GFR calc non Af Amer: >60 mL/min (ref >60)
GLUCOSE: 137 mg/dL — AB (ref 65–99)
POTASSIUM: 3.5 mmol/L (ref 3.5–5.1)
Sodium: 134 mmol/L — ABNORMAL LOW (ref 135–145)

## 2015-07-31 LAB — GLUCOSE, CAPILLARY: GLUCOSE-CAPILLARY: 138 mg/dL — AB (ref 65–99)

## 2015-07-31 MED ORDER — CEFAZOLIN SODIUM-DEXTROSE 2-3 GM-% IV SOLR
2.0000 g | INTRAVENOUS | Status: AC
  Administered 2015-08-01: 2 g via INTRAVENOUS
  Filled 2015-07-31: qty 50

## 2015-07-31 MED ORDER — KETOROLAC TROMETHAMINE 15 MG/ML IJ SOLN
15.0000 mg | Freq: Four times a day (QID) | INTRAMUSCULAR | Status: DC | PRN
  Administered 2015-07-31 – 2015-08-02 (×3): 15 mg via INTRAVENOUS
  Filled 2015-07-31 (×3): qty 1

## 2015-07-31 NOTE — Progress Notes (Signed)
Report called to [facility] Spoke to [name] pt to go to room #29C.Dr Verlon Au here to see pt discussed POC

## 2015-07-31 NOTE — Progress Notes (Signed)
CSW received consult that patient was admitted from facility - CSW confirmed that patient is from [facility] at [facility] (ph#: [phone]).   If patient will likely need higher level of care at discharge, please order PT evaluation.      Raynaldo Opitz, [facility] Clinical Social Worker cell #: [phone]

## 2015-07-31 NOTE — Progress Notes (Signed)
[facility] here to transport to [facility] son at bedside.Pain med given for transport.

## 2015-07-31 NOTE — Progress Notes (Signed)
Report Given to [facility].Son at bedside.

## 2015-07-31 NOTE — Progress Notes (Signed)
Patient just arrived to 3e29 via [facility] transport.  Pt stable. Denies pain and discomfort.

## 2015-07-31 NOTE — Progress Notes (Signed)
Leah Holland OVF:[id number] DOB: [birth year] DOA: 07/30/2015 PCP: Florina Ou, MD  Brief narrative:  81 y/o ? P Afib with some bradycardia since 08/03/13 Chad2Vasc2 > 4 on elliquis Htn Hld Dm ty 2 on  Gerd Insomnia Bipolar myoview stress 8/26/12low risk S/p Multiple knee surgeries Dr. French Ana  Very independent still driving and doing complete IADLs at home without assistance  Sustained a mechanical fall 8/18 with a refracture of her right hip-she had previously had a repair by Dr. French Ana in the past on the same hip No prodrome nor symptoms suggestive of neurocardiogenic etiology this was a simple mechanical fall  Orthopedics requested patient transferred to [facility] for further management    Consultants:  Orthopedics  Procedures:  None yet  Antibiotics:  None   Subjective  Doing fair. Pain level = 4/10. No nausea no vomiting. Has not eaten this morning Awaiting surgery   Objective    Interim History:   Telemetry:    Objective: Filed Vitals:   07/30/15 1754 07/30/15 2127 07/31/15 0152 07/31/15 0527  BP:  159/62 162/59 154/61  Pulse:  65 65 63  Temp:  98.1 F (36.7 C) 97.5 F (36.4 C) 98 F (36.7 C)  TempSrc:  Oral Oral Oral  Resp:  '18 18 20  ' Height:      Weight: 63 kg (138 lb 14.2 oz)     SpO2:  91% 97% 95%    Intake/Output Summary (Last 24 hours) at 07/31/15 0931 Last data filed at 07/31/15 0714  Gross per 24 hour  Intake    360 ml  Output    350 ml  Net     10 ml    Exam:  General: EOMI NCAT Cardiovascular: S1-S2 no murmur rub or gallop, telemetry benign with some PVCs only Respiratory: Clinically clear with no added sound Abdomen: Abdomen soft nontender nondistended no rebound no guarding Skin no lower extremity edema Neuro neurologically intact  Data Reviewed: Basic Metabolic Panel:  Recent Labs Lab 07/30/15 1539 07/31/15 0526  NA 136 134*  K 3.5 3.5  CL 96* 94*  CO2 30 33*  GLUCOSE 138* 137*  BUN 20  18  CREATININE 0.83 0.79  CALCIUM 10.0 9.6   Liver Function Tests:  Recent Labs Lab 07/30/15 1539  AST 25  ALT 21  ALKPHOS 104  BILITOT 1.5*  PROT 7.2  ALBUMIN 4.3   No results for input(s): LIPASE, AMYLASE in the last 168 hours. No results for input(s): AMMONIA in the last 168 hours. CBC:  Recent Labs Lab 07/30/15 1539 07/31/15 0526  WBC 12.4* 11.9*  NEUTROABS 10.2*  --   HGB 14.5 13.3  HCT 42.8 39.4  MCV 90.7 91.4  PLT 187 189   Cardiac Enzymes: No results for input(s): CKTOTAL, CKMB, CKMBINDEX, TROPONINI in the last 168 hours. BNP: Invalid input(s): POCBNP CBG: No results for input(s): GLUCAP in the last 168 hours.  No results found for this or any previous visit (from the past 240 hour(s)).   Studies:              All Imaging reviewed and is as per above notation   Scheduled Meds: . aspirin EC  81 mg Oral Daily  . atorvastatin  80 mg Oral q1800  . benazepril  40 mg Oral QPM  . chlorthalidone  25 mg Oral Daily  . diltiazem  180 mg Oral Daily  . docusate sodium  100 mg Oral BID  . FLUoxetine  20 mg Oral Daily  .  levothyroxine  100 mcg Oral QAC breakfast  . metoprolol  50 mg Oral BID  . pantoprazole  80 mg Oral Q1200   Continuous Infusions:    Assessment/Plan:  Right hip fracture -Dr. French Ana of orthopedics has been consulte -Dr. [name] of orthopedics will perform operative repair on 08/01/15 and [facility]? Right hip trochanteric and femoral nailing -We will feed the patient today -Elliquis will wash out ~ 24 hours so patient does not need anticoagulation at present but should get SCDs  -She is high functioning and performs at least 3 met with of exercise daily-Risk of surgery is not prohibitive but is moderate risk  1. per discretion Ortho services-appreciate their input into   Anticoagulation post-op  Weight bearing tolerance for therapy services  Wound care  Pain management  Follow-up services required  Paroxysmal atrial  fibrillation, [place] score above 4 on anticoagulation -Continue home medications of Cardizem 180 every 24, metoprolol 50 -She is currently in sinus rhythm telemetry has been benign so I have discontinued her telemetryaily -Continue Lotensin 40 daily  Diabetes mellitus type 2- Appears to be diet-controlled? Not on any medication currently Cover her sugars if they go above 180 with sliding scale otherwise will not check aggressively  Bipolar with insomnia -Continue fluoxetine 20 daily -Continue temazepam 30 daily at bedtime when necessary bedtime  Hyperlipidemia -Continue Lipitor 80 daily high intensity dosing -Continue flaxseed oil  Lower extremity swelling -Patient takes when necessary Lasix we will continue the same  Appt with PCP: Requested Code Status:  Full CODE STATUS confirmed at bedside Family Communication: no family present  Disposition PProbably will need to return to SNF Prphylaxis SCD Consultants: Ortho  Verneita Griffes, MD  [facility] Pager [phone] 07/31/2015, 9:31 AM    LOS: 1 day

## 2015-07-31 NOTE — Progress Notes (Signed)
Confirmed w/ Dr [name] that surgery tomorrow 08/01/15 at [facility] hospital.Dr Samtani made aware and called nurse Quetta at receiving hsop to make aware.Pt updated.

## 2015-07-31 NOTE — Progress Notes (Signed)
Patient seen and examined.  Right intertrochanteric hip fracture.  Plan is for surgical intervention tomorrow, once she has been optimized.  The risks benefits and alternatives were discussed with the patient and her family including but not limited to the risks of nonoperative treatment, versus surgical intervention including infection, bleeding, nerve injury, malunion, nonunion, the need for revision surgery, hardware prominence, hardware failure, the need for hardware removal, blood clots, cardiopulmonary complications, morbidity, mortality, among others, and they were willing to proceed.     Leah Bridge, MD

## 2015-08-01 ENCOUNTER — Inpatient Hospital Stay (HOSPITAL_COMMUNITY): Payer: Medicare Other

## 2015-08-01 ENCOUNTER — Encounter (HOSPITAL_COMMUNITY): Payer: Self-pay | Admitting: Orthopedic Surgery

## 2015-08-01 ENCOUNTER — Inpatient Hospital Stay (HOSPITAL_COMMUNITY): Payer: Medicare Other | Admitting: Certified Registered Nurse Anesthetist

## 2015-08-01 ENCOUNTER — Encounter (HOSPITAL_COMMUNITY)
Admission: EM | Disposition: A | Discharge status: Skilled Nursing Facility | Payer: Self-pay | Source: Home / Self Care | Attending: Internal Medicine

## 2015-08-01 DIAGNOSIS — S72141A Displaced intertrochanteric fracture of right femur, initial encounter for closed fracture: Principal | ICD-10-CM

## 2015-08-01 HISTORY — PX: FEMUR IM NAIL: SHX1597

## 2015-08-01 LAB — SURGICAL PCR SCREEN
MRSA, PCR: NEGATIVE
Staphylococcus aureus: NEGATIVE

## 2015-08-01 LAB — GLUCOSE, CAPILLARY
GLUCOSE-CAPILLARY: 120 mg/dL — AB (ref 65–99)
GLUCOSE-CAPILLARY: 155 mg/dL — AB (ref 65–99)

## 2015-08-01 SURGERY — INSERTION, INTRAMEDULLARY ROD, FEMUR
Anesthesia: General | Site: Hip | Laterality: Right

## 2015-08-01 MED ORDER — DOCUSATE SODIUM 100 MG PO CAPS
100.0000 mg | ORAL_CAPSULE | Freq: Two times a day (BID) | ORAL | Status: DC
  Administered 2015-08-01 – 2015-08-03 (×5): 100 mg via ORAL
  Filled 2015-08-01 (×4): qty 1

## 2015-08-01 MED ORDER — HYDROCODONE-ACETAMINOPHEN 5-325 MG PO TABS: 1.0000 | ORAL_TABLET | Freq: Four times a day (QID) | ORAL | Status: DC | PRN

## 2015-08-01 MED ORDER — NEOSTIGMINE METHYLSULFATE 10 MG/10ML IV SOLN
INTRAVENOUS | Status: DC | PRN
  Administered 2015-08-01: 4 mg via INTRAVENOUS

## 2015-08-01 MED ORDER — SENNA-DOCUSATE SODIUM 8.6-50 MG PO TABS
2.0000 | ORAL_TABLET | Freq: Every day | ORAL | Status: DC
Start: 2015-08-01 — End: 2016-03-16

## 2015-08-01 MED ORDER — CEFAZOLIN SODIUM-DEXTROSE 2-3 GM-% IV SOLR
2.0000 g | Freq: Four times a day (QID) | INTRAVENOUS | Status: AC
  Administered 2015-08-01 (×2): 2 g via INTRAVENOUS
  Filled 2015-08-01 (×2): qty 50

## 2015-08-01 MED ORDER — APIXABAN 2.5 MG PO TABS
2.5000 mg | ORAL_TABLET | Freq: Two times a day (BID) | ORAL | Status: DC
  Administered 2015-08-01 – 2015-08-03 (×5): 2.5 mg via ORAL
  Filled 2015-08-01 (×6): qty 1

## 2015-08-01 MED ORDER — LIDOCAINE HCL (CARDIAC) 20 MG/ML IV SOLN
INTRAVENOUS | Status: AC
  Filled 2015-08-01: qty 5

## 2015-08-01 MED ORDER — EPHEDRINE SULFATE 50 MG/ML IJ SOLN
INTRAMUSCULAR | Status: DC | PRN
  Administered 2015-08-01: 15 mg via INTRAVENOUS

## 2015-08-01 MED ORDER — LACTATED RINGERS IV SOLN
INTRAVENOUS | Status: DC | PRN
  Administered 2015-08-01: 08:00:00 via INTRAVENOUS

## 2015-08-01 MED ORDER — NEOSTIGMINE METHYLSULFATE 10 MG/10ML IV SOLN
INTRAVENOUS | Status: AC
  Filled 2015-08-01: qty 1

## 2015-08-01 MED ORDER — GLYCOPYRROLATE 0.2 MG/ML IJ SOLN
INTRAMUSCULAR | Status: DC | PRN
  Administered 2015-08-01: .6 mg via INTRAVENOUS

## 2015-08-01 MED ORDER — FENTANYL CITRATE (PF) 250 MCG/5ML IJ SOLN
INTRAMUSCULAR | Status: DC | PRN
  Administered 2015-08-01: 100 ug via INTRAVENOUS

## 2015-08-01 MED ORDER — BISACODYL 10 MG RE SUPP
10.0000 mg | Freq: Every day | RECTAL | Status: DC | PRN
  Administered 2015-08-02: 10 mg via RECTAL
  Filled 2015-08-01: qty 1

## 2015-08-01 MED ORDER — FENTANYL CITRATE (PF) 100 MCG/2ML IJ SOLN: 25.0000 ug | INTRAMUSCULAR | Status: DC | PRN

## 2015-08-01 MED ORDER — 0.9 % SODIUM CHLORIDE (POUR BTL) OPTIME
TOPICAL | Status: DC | PRN
  Administered 2015-08-01: 1000 mL

## 2015-08-01 MED ORDER — SUCCINYLCHOLINE CHLORIDE 20 MG/ML IJ SOLN
INTRAMUSCULAR | Status: AC
  Filled 2015-08-01: qty 1

## 2015-08-01 MED ORDER — DEXTROSE 5 % IV SOLN
10.0000 mg | INTRAVENOUS | Status: DC | PRN
  Administered 2015-08-01: 20 ug/min via INTRAVENOUS

## 2015-08-01 MED ORDER — PROPOFOL 10 MG/ML IV BOLUS
INTRAVENOUS | Status: DC | PRN
  Administered 2015-08-01: 150 mg via INTRAVENOUS

## 2015-08-01 MED ORDER — SODIUM CHLORIDE 0.9 % IV SOLN
75.0000 mL/h | INTRAVENOUS | Status: DC
Start: 2015-08-01 — End: 2015-08-02

## 2015-08-01 MED ORDER — POLYETHYLENE GLYCOL 3350 17 G PO PACK: 17.0000 g | PACK | Freq: Every day | ORAL | Status: DC | PRN

## 2015-08-01 MED ORDER — MORPHINE SULFATE (PF) 2 MG/ML IV SOLN: 2.0000 mg | INTRAVENOUS | Status: DC | PRN

## 2015-08-01 MED ORDER — ONDANSETRON HCL 4 MG/2ML IJ SOLN
INTRAMUSCULAR | Status: DC | PRN
  Administered 2015-08-01: 4 mg via INTRAVENOUS

## 2015-08-01 MED ORDER — PROPOFOL 10 MG/ML IV BOLUS
INTRAVENOUS | Status: AC
  Filled 2015-08-01: qty 20

## 2015-08-01 MED ORDER — GLYCOPYRROLATE 0.2 MG/ML IJ SOLN
INTRAMUSCULAR | Status: AC
  Filled 2015-08-01: qty 4

## 2015-08-01 MED ORDER — FERROUS SULFATE 325 (65 FE) MG PO TABS
325.0000 mg | ORAL_TABLET | Freq: Three times a day (TID) | ORAL | Status: DC
  Administered 2015-08-01 – 2015-08-03 (×6): 325 mg via ORAL
  Filled 2015-08-01 (×8): qty 1

## 2015-08-01 MED ORDER — PHENOL 1.4 % MT LIQD: 1.0000 | OROMUCOSAL | Status: DC | PRN

## 2015-08-01 MED ORDER — ACETAMINOPHEN 325 MG PO TABS: 650.0000 mg | ORAL_TABLET | Freq: Four times a day (QID) | ORAL | Status: DC | PRN

## 2015-08-01 MED ORDER — MAGNESIUM CITRATE PO SOLN: 1.0000 | Freq: Once | ORAL | Status: DC | PRN

## 2015-08-01 MED ORDER — ONDANSETRON HCL 4 MG/2ML IJ SOLN: 4.0000 mg | Freq: Four times a day (QID) | INTRAMUSCULAR | Status: DC | PRN

## 2015-08-01 MED ORDER — CEFAZOLIN SODIUM-DEXTROSE 2-3 GM-% IV SOLR
INTRAVENOUS | Status: AC
  Filled 2015-08-01: qty 50

## 2015-08-01 MED ORDER — PROMETHAZINE HCL 25 MG/ML IJ SOLN: 6.2500 mg | INTRAMUSCULAR | Status: DC | PRN

## 2015-08-01 MED ORDER — MENTHOL 3 MG MT LOZG
1.0000 | LOZENGE | OROMUCOSAL | Status: DC | PRN
  Filled 2015-08-01: qty 9

## 2015-08-01 MED ORDER — ALUM & MAG HYDROXIDE-SIMETH 200-200-20 MG/5ML PO SUSP
30.0000 mL | ORAL | Status: DC | PRN
  Administered 2015-08-02: 30 mL via ORAL
  Filled 2015-08-01: qty 30

## 2015-08-01 MED ORDER — APIXABAN 2.5 MG PO TABS: 2.5000 mg | ORAL_TABLET | Freq: Two times a day (BID) | ORAL | Status: DC

## 2015-08-01 MED ORDER — LACTATED RINGERS IV SOLN: INTRAVENOUS | Status: DC

## 2015-08-01 MED ORDER — HYDROCODONE-ACETAMINOPHEN 5-325 MG PO TABS
1.0000 | ORAL_TABLET | Freq: Four times a day (QID) | ORAL | Status: DC | PRN
  Administered 2015-08-01 (×2): 1 via ORAL
  Administered 2015-08-02 – 2015-08-03 (×3): 2 via ORAL
  Filled 2015-08-01: qty 2
  Filled 2015-08-01 (×2): qty 1
  Filled 2015-08-01: qty 2
  Filled 2015-08-01 (×2): qty 1

## 2015-08-01 MED ORDER — ROCURONIUM BROMIDE 100 MG/10ML IV SOLN
INTRAVENOUS | Status: DC | PRN
  Administered 2015-08-01: 35 mg via INTRAVENOUS

## 2015-08-01 MED ORDER — SENNA 8.6 MG PO TABS
1.0000 | ORAL_TABLET | Freq: Two times a day (BID) | ORAL | Status: DC
  Administered 2015-08-01 – 2015-08-03 (×5): 8.6 mg via ORAL
  Filled 2015-08-01 (×6): qty 1

## 2015-08-01 MED ORDER — ACETAMINOPHEN 650 MG RE SUPP: 650.0000 mg | Freq: Four times a day (QID) | RECTAL | Status: DC | PRN

## 2015-08-01 MED ORDER — FENTANYL CITRATE (PF) 250 MCG/5ML IJ SOLN
INTRAMUSCULAR | Status: AC
  Filled 2015-08-01: qty 5

## 2015-08-01 MED ORDER — MEPERIDINE HCL 25 MG/ML IJ SOLN: 6.2500 mg | INTRAMUSCULAR | Status: DC | PRN

## 2015-08-01 MED ORDER — ONDANSETRON HCL 4 MG PO TABS: 4.0000 mg | ORAL_TABLET | Freq: Four times a day (QID) | ORAL | Status: DC | PRN

## 2015-08-01 MED ORDER — LIDOCAINE HCL (CARDIAC) 20 MG/ML IV SOLN
INTRAVENOUS | Status: DC | PRN
  Administered 2015-08-01: 60 mg via INTRAVENOUS

## 2015-08-01 MED ORDER — PHENYLEPHRINE 40 MCG/ML (10ML) SYRINGE FOR IV PUSH (FOR BLOOD PRESSURE SUPPORT)
PREFILLED_SYRINGE | INTRAVENOUS | Status: AC
  Filled 2015-08-01: qty 10

## 2015-08-01 MED ORDER — ROCURONIUM BROMIDE 50 MG/5ML IV SOLN
INTRAVENOUS | Status: AC
  Filled 2015-08-01: qty 1

## 2015-08-01 SURGICAL SUPPLY — 42 items
APL SKNCLS STERI-STRIP NONHPOA (GAUZE/BANDAGES/DRESSINGS) ×1
BENZOIN TINCTURE PRP APPL 2/3 (GAUZE/BANDAGES/DRESSINGS) ×3 IMPLANT
BLADE HELICAL TFNA STRL (Anchor) ×2 IMPLANT
BOOTCOVER CLEANROOM LRG (PROTECTIVE WEAR) ×2 IMPLANT
CLOSURE STERI-STRIP 1/2X4 (GAUZE/BANDAGES/DRESSINGS) ×1
CLSR STERI-STRIP ANTIMIC 1/2X4 (GAUZE/BANDAGES/DRESSINGS) ×2 IMPLANT
COVER PERINEAL POST (MISCELLANEOUS) ×3 IMPLANT
COVER SURGICAL LIGHT HANDLE (MISCELLANEOUS) ×3 IMPLANT
DRAPE STERI IOBAN 125X83 (DRAPES) ×3 IMPLANT
DRSG MEPILEX BORDER 4X4 (GAUZE/BANDAGES/DRESSINGS) ×5 IMPLANT
DRSG MEPILEX BORDER 4X8 (GAUZE/BANDAGES/DRESSINGS) ×2 IMPLANT
DURAPREP 26ML APPLICATOR (WOUND CARE) ×3 IMPLANT
ELECT CAUTERY BLADE 6.4 (BLADE) ×3 IMPLANT
ELECT REM PT RETURN 9FT ADLT (ELECTROSURGICAL) ×3
ELECTRODE REM PT RTRN 9FT ADLT (ELECTROSURGICAL) ×1 IMPLANT
EVACUATOR 1/8 PVC DRAIN (DRAIN) IMPLANT
FACESHIELD WRAPAROUND (MASK) IMPLANT
FACESHIELD WRAPAROUND OR TEAM (MASK) ×2 IMPLANT
GAUZE XEROFORM 5X9 LF (GAUZE/BANDAGES/DRESSINGS) ×1 IMPLANT
GLOVE BIOGEL PI ORTHO PRO SZ8 (GLOVE) ×4
GLOVE ORTHO TXT STRL SZ7.5 (GLOVE) ×4 IMPLANT
GLOVE PI ORTHO PRO STRL SZ8 (GLOVE) ×2 IMPLANT
GLOVE SURG ORTHO 8.0 STRL STRW (GLOVE) ×4 IMPLANT
GOWN STRL REUS W/ TWL XL LVL3 (GOWN DISPOSABLE) ×1 IMPLANT
GOWN STRL REUS W/TWL 2XL LVL3 (GOWN DISPOSABLE) ×2 IMPLANT
GOWN STRL REUS W/TWL XL LVL3 (GOWN DISPOSABLE) ×3
KIT ROOM TURNOVER OR (KITS) ×3 IMPLANT
LINER BOOT UNIVERSAL DISP (MISCELLANEOUS) ×3 IMPLANT
MANIFOLD NEPTUNE II (INSTRUMENTS) ×3 IMPLANT
NAIL TI CANN 11MM 130D HIP (Nail) ×2 IMPLANT
NS IRRIG 1000ML POUR BTL (IV SOLUTION) ×3 IMPLANT
PACK GENERAL/GYN (CUSTOM PROCEDURE TRAY) ×3 IMPLANT
PAD ARMBOARD 7.5X6 YLW CONV (MISCELLANEOUS) ×6 IMPLANT
STAPLER VISISTAT 35W (STAPLE) ×1 IMPLANT
SUT VIC AB 0 CTB1 27 (SUTURE) ×3 IMPLANT
SUT VIC AB 2-0 FS1 27 (SUTURE) ×3 IMPLANT
SUT VIC AB 2-0 SH 27 (SUTURE)
SUT VIC AB 2-0 SH 27XBRD (SUTURE) IMPLANT
SUT VIC AB 3-0 SH 8-18 (SUTURE) ×3 IMPLANT
TOWEL OR 17X24 6PK STRL BLUE (TOWEL DISPOSABLE) ×3 IMPLANT
TOWEL OR 17X26 10 PK STRL BLUE (TOWEL DISPOSABLE) ×3 IMPLANT
WATER STERILE IRR 1000ML POUR (IV SOLUTION) ×3 IMPLANT

## 2015-08-01 NOTE — Progress Notes (Signed)
ANTICOAGULATION CONSULT NOTE - Initial Consult  Pharmacy Consult for Eliquis Indication: atrial fibrillation  Allergies  Allergen Reactions  . Morphine And Related Other (See Comments)    Made pt "wild"    Patient Measurements: Height: 5\' 4"  (162.6 cm) Weight: 138 lb 14 oz (62.994 kg) IBW/kg (Calculated) : 54.7 Heparin Dosing Weight:    Vital Signs: Temp: 97.7 F (36.5 C) (08/20 1100) Temp Source: Oral (08/20 0624) BP: 146/56 mmHg (08/20 1054) Pulse Rate: 63 (08/20 1100)  Labs:  Recent Labs  07/30/15 1539 07/30/15 1551 07/31/15 0526  HGB 14.5  --  13.3  HCT 42.8  --  39.4  PLT 187  --  189  LABPROT  --  14.6  --   INR  --  1.12  --   CREATININE 0.83  --  0.79    Estimated Creatinine Clearance: 40.4 mL/min (by C-G formula based on Cr of 0.79).   Medical History: Past Medical History  Diagnosis Date  . Complication of anesthesia   . PONV (postoperative nausea and vomiting)   . Hypertension   . Hyperlipidemia   . Swelling of extremity     sees Dr. Gwenlyn Found, cardiologist 1 x year [phone]  . Bronchitis 12/2011  . Hypothyroidism     takes synthroid  . GERD (gastroesophageal reflux disease)   . Neuromuscular disorder     carpal tunnel in right hand  . Insomnia   . Hyperlipidemia   . Skin cancer of face 2000's    "one on each side of my face and one on my nose" (08/13/2013)  . Diabetes mellitus type 2, diet-controlled   . History of blood transfusion     "w/both knee OR's" (08/13/2013)  . H/O hiatal hernia   . Arthritis     "probably" (08/13/2013)  . Atrial fibrillation     new onset/pt report 08/13/2013  . Depression   . Anxiety attack     "take RX prn" (08/13/2013)  . Intertrochanteric fracture of right hip 07/30/2015    Medications:  Prescriptions prior to admission  Medication Sig Dispense Refill Last Dose  . acetaminophen (TYLENOL) 325 MG tablet Take 650 mg by mouth every 6 (six) hours as needed for pain.   PRN  . aspirin EC 81 MG tablet Take 81 mg by  mouth daily.   07/30/2015 at Unknown time  . atorvastatin (LIPITOR) 80 MG tablet Take 80 mg by mouth daily.   07/29/2015 at Unknown time  . benazepril (LOTENSIN) 40 MG tablet Take 40 mg by mouth every evening.   07/29/2015 at Unknown time  . chlorthalidone (HYGROTON) 25 MG tablet TAKE 1 TABLET ONCE DAILY. 30 tablet 10 07/30/2015 at Unknown time  . diltiazem (CARDIZEM CD) 180 MG 24 hr capsule TAKE 1 CAPSULE DAILY. 30 capsule 6 07/30/2015 at Unknown time  . esomeprazole (NEXIUM) 20 MG capsule Take 20 mg by mouth daily at 12 noon.   07/30/2015 at Unknown time  . Flaxseed, Linseed, (FLAXSEED OIL PO) Take 1 tablet by mouth 2 (two) times daily.    07/30/2015 at Unknown time  . FLUoxetine (PROZAC) 20 MG capsule Take 1 capsule by mouth daily.   07/30/2015 at Unknown time  . furosemide (LASIX) 20 MG tablet Take 1 tablet (20 mg total) by mouth as needed for edema. (Patient taking differently: Take 20 mg by mouth as needed for edema (gain 3LBS or more). ) 30 tablet 5 07/29/2015 at Unknown time  . levothyroxine (SYNTHROID, LEVOTHROID) 100 MCG tablet Take 100 mcg  by mouth daily.    07/30/2015 at Unknown time  . metoprolol (LOPRESSOR) 50 MG tablet Take 50 mg by mouth 2 (two) times daily.   07/30/2015 at [place]  . Multiple Vitamins-Minerals (MACULAR VITAMIN BENEFIT PO) Take 1 tablet by mouth 2 (two) times daily.    07/30/2015 at Unknown time  . Polyethyl Glycol-Propyl Glycol (SYSTANE) 0.4-0.3 % SOLN Apply 1 drop to eye 3 (three) times daily as needed (dry eye).   Past Week at Unknown time  . potassium chloride (K-DUR) 10 MEQ tablet Take 1 tablet (10 mEq total) by mouth as needed. (Patient taking differently: Take 10 mEq by mouth as needed (when taking lasix). ) 30 tablet 5 07/29/2015 at Unknown time  . PROBIOTIC CAPS Take 1 capsule by mouth daily as needed (diarrhea).    PRN  . temazepam (RESTORIL) 30 MG capsule Take 30 mg by mouth at bedtime as needed for sleep.   07/29/2015 at Unknown time  . [DISCONTINUED] ELIQUIS 2.5 MG TABS  tablet TAKE 1 TABLET TWICE DAILY. 180 tablet 1 07/30/2015 at 0745    Assessment: Transferred from [facility] to [facility] for ORIF hip 8/20 s/p fall.  Anticoagulation: Pta Eliquis for PAF 2.5mg  BID, but her med rec says this was discontinued? Hgb 13.3 post-op. Would still need x 35d for VTE prophx s/p THR.  Goal of Therapy:  Therapeutic oral anticoagulation Monitor platelets by anticoagulation protocol: Yes   Plan:  Eliquis 2.5mg  BID    Varvara Legault S. Alford Highland, PharmD, BCPS Clinical Staff Pharmacist Pager [phone]  Eilene Ghazi Stillinger 08/01/2015,12:24 PM

## 2015-08-01 NOTE — Transfer of Care (Signed)
Immediate Anesthesia Transfer of Care Note  Patient: Leah Holland  Procedure(s) Performed: Procedure(s): INTRAMEDULLARY (IM) NAIL FEMORAL (Right)  Patient Location: PACU  Anesthesia Type:General  Level of Consciousness: awake, alert  and oriented  Airway & Oxygen Therapy: Patient Spontanous Breathing and Patient connected to nasal cannula oxygen  Post-op Assessment: Report given to RN and Post -op Vital signs reviewed and stable  Post vital signs: Reviewed and stable  Last Vitals:  Filed Vitals:   08/01/15 0624  BP: 152/68  Pulse: 60  Temp: 36.8 C  Resp: 18    Complications: No apparent anesthesia complications

## 2015-08-01 NOTE — Anesthesia Preprocedure Evaluation (Addendum)
Anesthesia Evaluation  Patient identified by MRN, date of birth, ID band Patient awake    Reviewed: Allergy & Precautions, NPO status , Patient's Chart, lab work & pertinent test results, reviewed documented beta blocker date and time   History of Anesthesia Complications (+) PONV  Airway Mallampati: I  TM Distance: >3 FB Neck ROM: Full    Dental  (+) Lower Dentures, Upper Dentures   Pulmonary neg pulmonary ROS,  breath sounds clear to auscultation        Cardiovascular hypertension, Pt. on medications Rhythm:Regular Rate:Normal     Neuro/Psych PSYCHIATRIC DISORDERS Anxiety Depression  Neuromuscular disease    GI/Hepatic Neg liver ROS, hiatal hernia, GERD-  Medicated,  Endo/Other  diabetes, Well Controlled, Type 2Hypothyroidism   Renal/GU negative Renal ROS  negative genitourinary   Musculoskeletal  (+) Arthritis -, Osteoarthritis,    Abdominal   Peds negative pediatric ROS (+)  Hematology   Anesthesia Other Findings   Reproductive/Obstetrics negative OB ROS                            Lab Results  Component Value Date   WBC 11.9* 07/31/2015   HGB 13.3 07/31/2015   HCT 39.4 07/31/2015   MCV 91.4 07/31/2015   PLT 189 07/31/2015   Lab Results  Component Value Date   CREATININE 0.79 07/31/2015   BUN 18 07/31/2015   NA 134* 07/31/2015   K 3.5 07/31/2015   CL 94* 07/31/2015   CO2 33* 07/31/2015   Lab Results  Component Value Date   INR 1.12 07/30/2015   INR 0.92 02/23/2012   07/30/2015  EKG: normal sinus rhythm.  Most recent echo 2005.  Anesthesia Physical Anesthesia Plan  ASA: III  Anesthesia Plan: General   Post-op Pain Management:    Induction: Intravenous  Airway Management Planned: Oral ETT  Additional Equipment:   Intra-op Plan:   Post-operative Plan: Extubation in OR  Informed Consent: I have reviewed the patients History and Physical, chart, labs and  discussed the procedure including the risks, benefits and alternatives for the proposed anesthesia with the patient or authorized representative who has indicated his/her understanding and acceptance.   Dental advisory given  Plan Discussed with: CRNA  Anesthesia Plan Comments:         Anesthesia Quick Evaluation

## 2015-08-01 NOTE — Anesthesia Procedure Notes (Signed)
Procedure Name: Intubation Performed by: Mariea Clonts Patient Re-evaluated:Patient Re-evaluated prior to inductionOxygen Delivery Method: Circle system utilized Preoxygenation: Pre-oxygenation with 100% oxygen Intubation Type: IV induction Ventilation: Mask ventilation without difficulty Laryngoscope Size: Miller and 2 Grade View: Grade I Tube type: Oral Tube size: 7.0 mm Number of attempts: 1 Placement Confirmation: breath sounds checked- equal and bilateral,  positive ETCO2 and ETT inserted through vocal cords under direct vision Tube secured with: Tape Dental Injury: Teeth and Oropharynx as per pre-operative assessment

## 2015-08-01 NOTE — Progress Notes (Signed)
Patient Demographics:    Leah Holland, is a 81 y.o. female, DOB - [birth year], DSK:[id number]  Admit date - 07/30/2015   Admitting Physician Nita Sells, MD  Outpatient Primary MD for the patient is Florina Ou, MD  LOS - 2   Chief Complaint  Patient presents with  . Hip Pain    right, post fall        Subjective:    [name] today has, No headache, No chest pain, No abdominal pain - No Nausea, No new weakness tingling or numbness, No Cough - SOB.     Assessment  & Plan :     1. Mechanical fall with right femoral intertrochanteric fracture. Status post open reduction internal fixation by Dr. [name] on 08/01/2015, seems to have tolerated the procedure well. Weightbearing as tolerated, Eliquis for DVT prophylaxis confirmed with Dr. [name] to be started now. PT. may require SNF.   2. Reactionary leukocytosis due to #1 above. Stable no acute issues. Remains afebrile with monitor.   3. Proximal atrial fibrillation with [place] Vasc 2 of > 3 - resume diltiazem, resume beta blocker. Eliquis to be restarted confirmed with Dr. [name]. Pharmacy to monitor.   4. Dyslipidemia. Continue home dose statin.   5. Essential hypertension. Stable on combination of Cardizem, beta blocker along with diuretic and ACE inhibitor.   6. Bipolar disorder. Continue fluoxetine and temazepam.   7. DM type II diet controlled.  CBG (last 3)   Recent Labs  07/31/15 1012 08/01/15 0802 08/01/15 1025  GLUCAP 138* 120* 155*      Code Status : Full  Family Communication  : None present  Disposition Plan  : SNF  Consults  :  Orthopedics  Procedures  :   Right hip open reduction internal fixation by Dr. [name] 08/01/2015  DVT Prophylaxis  :  Eliquis  Lab Results  Component Value Date   PLT 189 07/31/2015    Inpatient Medications  Scheduled Meds: . aspirin EC  81 mg Oral Daily  . atorvastatin  80 mg Oral q1800  . benazepril  40 mg Oral QPM  . chlorthalidone  25 mg Oral Daily  . diltiazem  180 mg Oral Daily  . docusate sodium  100 mg Oral BID  . FLUoxetine  20 mg Oral Daily  . levothyroxine  100 mcg Oral QAC breakfast  . metoprolol  50 mg Oral BID  . pantoprazole  80 mg Oral Q1200   Continuous Infusions:  PRN Meds:.acetaminophen, acidophilus, bisacodyl, furosemide, HYDROcodone-acetaminophen, ketorolac, menthol-cetylpyridinium, polyethylene glycol, sodium phosphate, sorbitol, temazepam  Antibiotics  :    Anti-infectives    Start     Dose/Rate Route Frequency Ordered Stop   08/01/15 0800  [MAR Hold]  ceFAZolin (ANCEF) IVPB 2 g/50 mL premix     (MAR Hold since 08/01/15 0741)   2 g 100 mL/hr over 30 Minutes Intravenous To [facility] 07/31/15 1945 08/01/15 0858        Objective:   Filed Vitals:   08/01/15 1039 08/01/15 1045 08/01/15 1054 08/01/15 1100  BP: 136/49  146/56   Pulse: 60 59 61 63  Temp:    97.7 F (36.5 C)  TempSrc:      Resp: 15 15 15 18   Height:  Weight:      SpO2: 97% 97% 97% 94%    Wt Readings from Last 3 Encounters:  08/01/15 62.994 kg (138 lb 14 oz)  03/03/15 64.365 kg (141 lb 14.4 oz)  09/01/14 63.186 kg (139 lb 4.8 oz)     Intake/Output Summary (Last 24 hours) at 08/01/15 1217 Last data filed at 08/01/15 1106  Gross per 24 hour  Intake   1500 ml  Output    750 ml  Net    750 ml     Physical Exam  Awake Alert, Oriented X 3, No new F.N deficits, Normal affect [place].AT,PERRAL Supple Neck,No JVD, No cervical lymphadenopathy appriciated.  Symmetrical Chest wall movement, Good air movement bilaterally, CTAB RRR,No Gallops,Rubs or new Murmurs, No Parasternal Heave +ve B.Sounds, Abd Soft, No tenderness, No organomegaly appriciated, No rebound - guarding or rigidity. No Cyanosis, Clubbing or edema, No new Rash or  bruise, right hip post op site appears clean    Data Review:   Micro Results Recent Results (from the past 240 hour(s))  MRSA PCR Screening     Status: None   Collection Time: 07/31/15  4:54 AM  Result Value Ref Range Status   MRSA by PCR NEGATIVE NEGATIVE Final    Comment:        The GeneXpert MRSA Assay (FDA approved for NASAL specimens only), is one component of a comprehensive MRSA colonization surveillance program. It is not intended to diagnose MRSA infection nor to guide or monitor treatment for MRSA infections.   Surgical pcr screen     Status: None   Collection Time: 08/01/15  7:30 AM  Result Value Ref Range Status   MRSA, PCR NEGATIVE NEGATIVE Final   Staphylococcus aureus NEGATIVE NEGATIVE Final    Comment:        The Xpert SA Assay (FDA approved for NASAL specimens in patients over 63 years of age), is one component of a comprehensive surveillance program.  Test performance has been validated by [facility] for patients greater than or equal to 30 year old. It is not intended to diagnose infection nor to guide or monitor treatment.     Radiology Reports Dg Chest 1 View  07/30/2015   CLINICAL DATA:  Status post fall today.  Hip pain.  EXAM: CHEST  1 VIEW  COMPARISON:  PA and lateral chest 08/03/2013.  CT chest 04/27/2013.  FINDINGS: The lungs are clear. Heart size is normal. No pneumothorax or pleural effusion. No focal bony abnormality is seen. Aortic atherosclerosis is noted.  IMPRESSION: No acute disease.   Electronically Signed   By: Inge Rise M.D.   On: 07/30/2015 15:22   Dg Forearm Right  07/30/2015   CLINICAL DATA:  Patient fell earlier today landing on right arm.  EXAM: RIGHT FOREARM - 2 VIEW  COMPARISON:  None.  FINDINGS: Frontal and lateral views were obtained. There are areas of soft tissue prominence dorsally, most likely representing soft tissue hematomas. No acute fracture or dislocation is apparent. There is extensive calcification in  the radiocarpal joint. There is extensive osteoarthritic change in the scaphotrapezial and first carpal -metacarpal joints. There are foci of arterial vascular calcification.  IMPRESSION: No acute fracture or dislocation. Soft tissue hematomas dorsally. 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 Foci of arterial vascular calcification, primarily in the ulnar artery.   Electronically Signed   By: Lowella Grip III M.D.   On: 07/30/2015 15:20   Pelvis Portable  08/01/2015   CLINICAL DATA:  Status  post ORIF  EXAM: PORTABLE PELVIS 1-2 VIEWS  COMPARISON:  None.  FINDINGS: Right medullary rod with proximal fixation screw is noted. The fracture fragments are in near anatomic alignment. No other focal abnormality is seen.  IMPRESSION: Status post ORIF of proximal right femoral fracture.   Electronically Signed   By: Inez Catalina M.D.   On: 08/01/2015 11:32   Dg Knee Complete 4 Views Right  07/30/2015   CLINICAL DATA:  Fall today. Right hip pain. Acute intertrochanteric fracture of the right hip.  EXAM: RIGHT KNEE - COMPLETE 4+ VIEW  COMPARISON:  09/29/2004  FINDINGS: Right total knee prosthesis observed without visible fracture in the vicinity of the knee. No hardware complication identified.  IMPRESSION: 1. Total knee prosthesis, without complicating feature observed.   Electronically Signed   By: Van Clines M.D.   On: 07/30/2015 15:21   Dg Hip Operative Unilat With Pelvis Right  08/01/2015   CLINICAL DATA:  Right proximal femoral fracture  EXAM: OPERATIVE right HIP (WITH PELVIS IF PERFORMED) 4 VIEWS  TECHNIQUE: Fluoroscopic spot image(s) were submitted for interpretation post-operatively.  COMPARISON:  None.  FINDINGS: Spot films were obtained reveal a medullary rod with fixation screw in the proximal femur. Fracture fragments are in near anatomic alignment.  IMPRESSION: Status post ORIF of proximal right femoral fracture.   Electronically Signed   By: Inez Catalina M.D.   On: 08/01/2015  10:27   Dg Femur Port, 1v Right  08/01/2015   CLINICAL DATA:  Intra medullary nail  EXAM: RIGHT FEMUR PORTABLE 1 VIEW  COMPARISON:  07/30/2015  FINDINGS: An intra medullary rod is now in place across and intertrochanteric femur fracture. The fracture is not clearly visualized on this study. Total knee arthroplasty is also noted.  IMPRESSION: ORIF right intertrochanteric femur fracture.   Electronically Signed   By: Marybelle Killings M.D.   On: 08/01/2015 11:23   Dg Hips Bilat With Pelvis 3-4 Views  07/30/2015   CLINICAL DATA:  Right hip pain and limited range of motion after falling when walking into a restaurant today.  EXAM: DG HIP (WITH OR WITHOUT PELVIS) 3-4V BILAT  COMPARISON:  None.  FINDINGS: Comminuted right intertrochanteric fracture with varus angulation and mild distraction of the major fragments. Mild left hip degenerative changes. Lower lumbar spine degenerative changes.  IMPRESSION: 1. Comminuted right intertrochanteric fracture with varus angulation. 2. Mild left hip and lower lumbar spine degenerative changes.   Electronically Signed   By: Claudie Revering M.D.   On: 07/30/2015 15:19     CBC  Recent Labs Lab 07/30/15 1539 07/31/15 0526  WBC 12.4* 11.9*  HGB 14.5 13.3  HCT 42.8 39.4  PLT 187 189  MCV 90.7 91.4  MCH 30.7 30.9  MCHC 33.9 33.8  RDW 12.8 13.1  LYMPHSABS 1.5  --   MONOABS 0.7  --   EOSABS 0.1  --   BASOSABS 0.0  --     Chemistries   Recent Labs Lab 07/30/15 1539 07/31/15 0526  NA 136 134*  K 3.5 3.5  CL 96* 94*  CO2 30 33*  GLUCOSE 138* 137*  BUN 20 18  CREATININE 0.83 0.79  CALCIUM 10.0 9.6  AST 25  --   ALT 21  --   ALKPHOS 104  --   BILITOT 1.5*  --    ------------------------------------------------------------------------------------------------------------------ estimated creatinine clearance is 40.4 mL/min (by C-G formula based on Cr of  0.79). ------------------------------------------------------------------------------------------------------------------ No results for input(s): HGBA1C in the last 72 hours. ------------------------------------------------------------------------------------------------------------------  No results for input(s): CHOL, HDL, LDLCALC, TRIG, CHOLHDL, LDLDIRECT in the last 72 hours. ------------------------------------------------------------------------------------------------------------------ No results for input(s): TSH, T4TOTAL, T3FREE, THYROIDAB in the last 72 hours.  Invalid input(s): FREET3 ------------------------------------------------------------------------------------------------------------------ No results for input(s): VITAMINB12, FOLATE, FERRITIN, TIBC, IRON, RETICCTPCT in the last 72 hours.  Coagulation profile  Recent Labs Lab 07/30/15 1551  INR 1.12    No results for input(s): DDIMER in the last 72 hours.  Cardiac Enzymes No results for input(s): CKMB, TROPONINI, MYOGLOBIN in the last 168 hours.  Invalid input(s): CK ------------------------------------------------------------------------------------------------------------------ Invalid input(s): POCBNP   Time Spent in minutes   35   SINGH,PRASHANT K M.D on 08/01/2015 at 12:17 PM  Between 7am to 7pm - Pager - [phone]  After 7pm go to [email or web address] - password [facility]  [facility] -  Office  [phone]

## 2015-08-01 NOTE — Clinical Social Work Note (Signed)
Clinical Social Work Assessment  Patient Details  Name: Leah Holland MRN: [id number] Date of Birth: 1922-07-29  Date of referral:  08/01/15               Reason for consult:  Facility Placement                Permission sought to share information with:  Facility Art therapist granted to share information::     Name::        Agency::     Relationship::   ([facility] facilities)  Contact Information:     Housing/Transportation Living arrangements for the past 2 months:  Charity fundraiser of Information:  Adult Children Patient Interpreter Needed:  None Criminal Activity/Legal Involvement Pertinent to Current Situation/Hospitalization:  No - Comment as needed Significant Relationships:  Adult Children, Friend, [place] Lives with:  Self Do you feel safe going back to the place where you live?  No Need for family participation in patient care:  No (Coment)  Care giving concerns: Pt lives alone in [place] at [facility].  Pt will need rehab before returning to her apartment.They will not have a SNF bed for pt when she is d/c, but there may be a bed at [facility].  Social Worker assessment / plan: CSW discussed role of CSW with pt's son while pt was in surgery.  Per sons, pt has had rehab in the past at [facility] and would be agreeable to her returning there at d/c, if [facility] did not have a bed for her.  CSW will begin bed search for pt and facilitate NH tx as appropriate. Employment status:  Retired Forensic scientist:  Medicare PT Recommendations:    Information / Referral to community resources:     Patient/Family's Response to care:  [name] agreeable to [employer] with [employer] and [employer] being their first and second choices.  Patient/Family's Understanding of and Emotional Response to Diagnosis, Current Treatment, and Prognosis: Pt's sons realize that pt will need rehab prior to returning home alone.  They describe pt as being very  independent and active in her daily life and expect her to be able to return to this level of independence after rehab. Emotional Assessment Appearance:   (pt in surgery) Attitude/Demeanor/Rapport:    Affect (typically observed):  Unable to Assess Orientation:  Oriented to Self, Oriented to Place, Oriented to Situation Alcohol / Substance use:  Not Applicable Psych involvement (Current and /or in the community):  No (Comment)  Discharge Needs  Concerns to be addressed:  Discharge Planning Concerns Readmission within the last 30 days:  No Current discharge risk:  None Barriers to Discharge:  No Barriers Identified   Leah Holland M, LCSW 08/01/2015, 4:20 PM

## 2015-08-01 NOTE — Op Note (Signed)
DATE OF SURGERY:  08/01/2015  TIME: 10:15 AM  PATIENT NAME:  Leah Holland  AGE: 81 y.o.  PRE-OPERATIVE DIAGNOSIS:  RIGHT FEMUR FRACTURE  POST-OPERATIVE DIAGNOSIS:  SAME  PROCEDURE:  INTRAMEDULLARY (IM) NAIL FEMORAL  SURGEON:  Nilani Hugill P  ASSISTANT:  Joya Gaskins, OPA-C, present and scrubbed throughout the case, critical for assistance with exposure, retraction, instrumentation, and closure.  OPERATIVE IMPLANTS: Synthes trochanteric femoral nail with interlocking helical blade into the femoral head.  UNIQUE ASPECTS OF THE CASE:  She had a distal total knee replacement, and I did not perform a distal lock to minimize stress risers distally and I felt the fracture was rotationally stable, she did have fairly good diaphyseal interference fit, with some conferred rotational stability.  PREOPERATIVE INDICATIONS:  Leah Holland is a 81 y.o. year old who fell and suffered a hip fracture. She was brought into the ER and then admitted and optimized and then elected for surgical intervention.    The risks benefits and alternatives were discussed with the patient including but not limited to the risks of nonoperative treatment, versus surgical intervention including infection, bleeding, nerve injury, malunion, nonunion, hardware prominence, hardware failure, need for hardware removal, blood clots, cardiopulmonary complications, morbidity, mortality, among others, and they were willing to proceed.    OPERATIVE PROCEDURE:  The patient was brought to the operating room and placed in the supine position. General anesthesia was administered, with a foley. She was placed on the fracture table.  Closed reduction was performed under C-arm guidance. The length of the femur was also measured using fluoroscopy. Time out was then performed after sterile prep and drape. She received preoperative antibiotics.  Incision was made proximal to the greater trochanter. A guidewire was placed in the  appropriate position. Confirmation was made on AP and lateral views. The above-named nail was opened. I opened the proximal femur with a reamer. I then placed the nail by hand easily down. I did not need to ream the femur.  Once the nail was completely seated, I placed a guidepin into the femoral head into the center center position. I measured the length, and then reamed the lateral cortex and up into the head. I then placed the helical blade. Slight compression was applied. Anatomic fixation achieved. Bone quality was mediocre.  I then secured the proximal interlocking bolt, and took off a half a turn, and then removed the instruments, and took final C-arm pictures AP and lateral the entire length of the leg. Anatomic reconstruction was achieved, and the wounds were irrigated copiously and closed with Vicryl followed by Steri-Strips and sterile gauze for the skin. The patient was awakened and returned to PACU in stable and satisfactory condition. There no complications and the patient tolerated the procedure well.  She will be weightbearing as tolerated, and will be on Lovenox  for a period of three weeks after discharge.   Marchia Bond, M.D.

## 2015-08-01 NOTE — Anesthesia Postprocedure Evaluation (Signed)
  Anesthesia Post-op Note  Patient: Leah Holland  Procedure(s) Performed: Procedure(s): INTRAMEDULLARY (IM) NAIL FEMORAL (Right)  Patient Location: PACU  Anesthesia Type:General  Level of Consciousness: awake and alert   Airway and Oxygen Therapy: Patient Spontanous Breathing  Post-op Pain: mild  Post-op Assessment: Post-op Vital signs reviewed and Patient's Cardiovascular Status Stable     RLE Motor Response: Purposeful movement RLE Sensation: Full sensation      Post-op Vital Signs: Reviewed and stable  Last Vitals:  Filed Vitals:   08/01/15 1015  BP:   Pulse: 65  Temp:   Resp: 14    Complications: No apparent anesthesia complications

## 2015-08-01 NOTE — Clinical Social Work Placement (Signed)
   CLINICAL SOCIAL WORK PLACEMENT  NOTE  Date:  08/01/2015  Patient Details  Name: YLIANNA ALMANZAR MRN: [id number] Date of Birth: Oct 24, 1922  Clinical Social Work is seeking post-discharge placement for this patient at the [place] level of care (*CSW will initial, date and re-position this form in  chart as items are completed):  Yes   Patient/family provided with [facility] Work Department's list of facilities offering this level of care within the geographic area requested by the patient (or if unable, by the patient's family).  Yes   Patient/family informed of their freedom to choose among providers that offer the needed level of care, that participate in Medicare, Medicaid or managed care program needed by the patient, have an available bed and are willing to accept the patient.  Yes   Patient/family informed of [facility]'s ownership interest in [facility] and [facility], as well as of the fact that they are under no obligation to receive care at these facilities.  PASRR submitted to EDS on       PASRR number received on  (previously issued)     Existing PASRR number confirmed on       FL2 transmitted to all facilities in geographic area requested by pt/family on       FL2 transmitted to all facilities within larger geographic area on 08/01/15     Patient informed that his/her managed care company has contracts with or will negotiate with certain facilities, including the following:            Patient/family informed of bed offers received.  Patient chooses bed at       Physician recommends and patient chooses bed at      Patient to be transferred to   on  .  Patient to be transferred to facility by       Patient family notified on   of transfer.  Name of family member notified:        PHYSICIAN       Additional Comment:    _______________________________________________ Roanna Raider, LCSW 08/01/2015, 4:27 PM

## 2015-08-01 NOTE — Progress Notes (Signed)
The patient has been re-examined, and the chart reviewed, and there have been no interval changes to the documented history and physical.    The risks, benefits, and alternatives have been discussed at length, and the patient is willing to proceed.    Johnny Bridge, MD

## 2015-08-01 NOTE — Progress Notes (Signed)
Pt returned from OR, report received at the bedside pt AAOx3 family at the bedside.

## 2015-08-02 ENCOUNTER — Encounter (HOSPITAL_COMMUNITY): Payer: Self-pay | Admitting: Orthopedic Surgery

## 2015-08-02 DIAGNOSIS — I480 Paroxysmal atrial fibrillation: Secondary | ICD-10-CM

## 2015-08-02 DIAGNOSIS — E038 Other specified hypothyroidism: Secondary | ICD-10-CM

## 2015-08-02 DIAGNOSIS — I10 Essential (primary) hypertension: Secondary | ICD-10-CM

## 2015-08-02 LAB — CBC
HEMATOCRIT: 29.2 % — AB (ref 36.0–46.0)
HEMOGLOBIN: 9.8 g/dL — AB (ref 12.0–15.0)
MCH: 31.3 pg (ref 26.0–34.0)
MCHC: 33.6 g/dL (ref 30.0–36.0)
MCV: 93.3 fL (ref 78.0–100.0)
Platelets: 129 10*3/uL — ABNORMAL LOW (ref 150–400)
RBC: 3.13 MIL/uL — ABNORMAL LOW (ref 3.87–5.11)
RDW: 13.6 % (ref 11.5–15.5)
WBC: 11.4 10*3/uL — AB (ref 4.0–10.5)

## 2015-08-02 LAB — URINALYSIS, ROUTINE W REFLEX MICROSCOPIC
Bilirubin Urine: NEGATIVE
Glucose, UA: NEGATIVE mg/dL
Hgb urine dipstick: NEGATIVE
Ketones, ur: NEGATIVE mg/dL
LEUKOCYTES UA: NEGATIVE
NITRITE: NEGATIVE
PROTEIN: NEGATIVE mg/dL
SPECIFIC GRAVITY, URINE: 1.019 (ref 1.005–1.030)
UROBILINOGEN UA: 0.2 mg/dL (ref 0.0–1.0)
pH: 5 (ref 5.0–8.0)

## 2015-08-02 LAB — BASIC METABOLIC PANEL
ANION GAP: 6 (ref 5–15)
BUN: 24 mg/dL — ABNORMAL HIGH (ref 6–20)
CHLORIDE: 92 mmol/L — AB (ref 101–111)
CO2: 33 mmol/L — AB (ref 22–32)
Calcium: 8.9 mg/dL (ref 8.9–10.3)
Creatinine, Ser: 1.15 mg/dL — ABNORMAL HIGH (ref 0.44–1.00)
GFR calc non Af Amer: 41 mL/min — ABNORMAL LOW (ref >60)
GFR, EST AFRICAN AMERICAN: 47 mL/min — AB (ref >60)
Glucose, Bld: 125 mg/dL — ABNORMAL HIGH (ref 65–99)
POTASSIUM: 3.8 mmol/L (ref 3.5–5.1)
Sodium: 131 mmol/L — ABNORMAL LOW (ref 135–145)

## 2015-08-02 LAB — URIC ACID: Uric Acid, Serum: 5.1 mg/dL (ref 2.3–6.6)

## 2015-08-02 LAB — OSMOLALITY, URINE: OSMOLALITY UR: 501 mosm/kg (ref 390–1090)

## 2015-08-02 LAB — OSMOLALITY: OSMOLALITY: 281 mosm/kg (ref 275–300)

## 2015-08-02 LAB — SODIUM, URINE, RANDOM

## 2015-08-02 LAB — CREATININE, URINE, RANDOM: CREATININE, URINE: 163.62 mg/dL

## 2015-08-02 MED ORDER — METOPROLOL TARTRATE 25 MG PO TABS
25.0000 mg | ORAL_TABLET | Freq: Two times a day (BID) | ORAL | Status: DC
  Administered 2015-08-02 – 2015-08-03 (×2): 25 mg via ORAL
  Filled 2015-08-02 (×2): qty 1

## 2015-08-02 MED ORDER — SODIUM CHLORIDE 0.9 % IV SOLN
INTRAVENOUS | Status: DC
  Administered 2015-08-02: 11:00:00 via INTRAVENOUS

## 2015-08-02 NOTE — Progress Notes (Signed)
Patient Demographics:    Leah Holland, is a 81 y.o. female, DOB - 06-05-1922, CWU:[id number]  Admit date - 07/30/2015   Admitting Physician Nita Sells, MD  Outpatient Primary MD for the patient is Florina Ou, MD  LOS - 3   Chief Complaint  Patient presents with  . Hip Pain    right, post fall        Subjective:    Leah Holland today has, No headache, No chest pain, No abdominal pain - No Nausea, No new weakness tingling or numbness, No Cough - SOB.     Assessment  & Plan :     1. Mechanical fall with right femoral intertrochanteric fracture. Status post open reduction internal fixation by Dr. [name] on 08/01/2015, seems to have tolerated the procedure well. Weightbearing as tolerated, Eliquis for DVT prophylaxis confirmed with Dr. [name] to be started now. PT. Will require SNF.   2. Reactionary leukocytosis due to #1 above. Stable no acute issues. Remains afebrile with monitor.   3. Proximal atrial fibrillation with [place] Vasc 2 of > 3 - resume diltiazem, resume beta blocker. Eliquis to be restarted confirmed with Dr. [name]. Pharmacy to monitor.   4. Dyslipidemia. Continue home dose statin.   5. Essential hypertension. Stable on combination of Cardizem, beta blocker along with diuretic and ACE inhibitor.   6. Bipolar disorder. Continue fluoxetine and temazepam.   7. Hypernatremia with mild AKI due to dehydration. Urine sodium less than 10, gentle normal saline and repeat BMP in the morning.    8. DM type II diet controlled.  CBG (last 3)   Recent Labs  07/31/15 1012 08/01/15 0802 08/01/15 1025  GLUCAP 138* 120* 155*      Code Status : Full  Family Communication  : None present  Disposition Plan  : SNF  Consults  :  Orthopedics  Procedures  :   Right  hip open reduction internal fixation by Dr. [name] 08/01/2015  DVT Prophylaxis  :  Eliquis  Lab Results  Component Value Date   PLT 129* 08/02/2015    Inpatient Medications  Scheduled Meds: . apixaban  2.5 mg Oral BID  . aspirin EC  81 mg Oral Daily  . atorvastatin  80 mg Oral q1800  . benazepril  40 mg Oral QPM  . chlorthalidone  25 mg Oral Daily  . diltiazem  180 mg Oral Daily  . docusate sodium  100 mg Oral BID  . ferrous sulfate  325 mg Oral TID PC  . FLUoxetine  20 mg Oral Daily  . levothyroxine  100 mcg Oral QAC breakfast  . metoprolol  50 mg Oral BID  . pantoprazole  80 mg Oral Q1200  . senna  1 tablet Oral BID   Continuous Infusions:  PRN Meds:.acetaminophen **OR** acetaminophen, acidophilus, alum & mag hydroxide-simeth, bisacodyl, HYDROcodone-acetaminophen, ketorolac, magnesium citrate, menthol-cetylpyridinium **OR** phenol, menthol-cetylpyridinium, morphine injection, ondansetron **OR** ondansetron (ZOFRAN) IV, polyethylene glycol, temazepam  Antibiotics  :    Anti-infectives    Start     Dose/Rate Route Frequency Ordered Stop   08/01/15 1545  ceFAZolin (ANCEF) IVPB 2 g/50 mL premix     2 g 100 mL/hr over 30 Minutes Intravenous Every 6 hours 08/01/15 1531 08/01/15 2338  08/01/15 0800  [MAR Hold]  ceFAZolin (ANCEF) IVPB 2 g/50 mL premix     (MAR Hold since 08/01/15 0741)   2 g 100 mL/hr over 30 Minutes Intravenous To [facility] 07/31/15 1945 08/01/15 0858        Objective:   Filed Vitals:   08/02/15 0135 08/02/15 0500 08/02/15 0905 08/02/15 0954  BP: 133/50 122/41 115/40 104/42  Pulse: 74 69 76 73  Temp: 97.8 F (36.6 C) 98.1 F (36.7 C)  98.2 F (36.8 C)  TempSrc: Oral Oral  Oral  Resp: 20 20 20    Height:      Weight:  66.5 kg (146 lb 9.7 oz)    SpO2: 95% 99% 91% 96%    Wt Readings from Last 3 Encounters:  08/02/15 66.5 kg (146 lb 9.7 oz)  03/03/15 64.365 kg (141 lb 14.4 oz)  09/01/14 63.186 kg (139 lb 4.8 oz)     Intake/Output  Summary (Last 24 hours) at 08/02/15 1058 Last data filed at 08/02/15 1040  Gross per 24 hour  Intake    680 ml  Output    450 ml  Net    230 ml     Physical Exam  Awake Alert, Oriented X 3, No new F.N deficits, Normal affect [place].AT,PERRAL Supple Neck,No JVD, No cervical lymphadenopathy appriciated.  Symmetrical Chest wall movement, Good air movement bilaterally, CTAB RRR,No Gallops,Rubs or new Murmurs, No Parasternal Heave +ve B.Sounds, Abd Soft, No tenderness, No organomegaly appriciated, No rebound - guarding or rigidity. No Cyanosis, Clubbing or edema, No new Rash or bruise, right hip post op site appears clean    Data Review:   Micro Results Recent Results (from the past 240 hour(s))  MRSA PCR Screening     Status: None   Collection Time: 07/31/15  4:54 AM  Result Value Ref Range Status   MRSA by PCR NEGATIVE NEGATIVE Final    Comment:        The GeneXpert MRSA Assay (FDA approved for NASAL specimens only), is one component of a comprehensive MRSA colonization surveillance program. It is not intended to diagnose MRSA infection nor to guide or monitor treatment for MRSA infections.   Surgical pcr screen     Status: None   Collection Time: 08/01/15  7:30 AM  Result Value Ref Range Status   MRSA, PCR NEGATIVE NEGATIVE Final   Staphylococcus aureus NEGATIVE NEGATIVE Final    Comment:        The Xpert SA Assay (FDA approved for NASAL specimens in patients over 14 years of age), is one component of a comprehensive surveillance program.  Test performance has been validated by [facility] for patients greater than or equal to 23 year old. It is not intended to diagnose infection nor to guide or monitor treatment.     Radiology Reports Dg Chest 1 View  07/30/2015   CLINICAL DATA:  Status post fall today.  Hip pain.  EXAM: CHEST  1 VIEW  COMPARISON:  PA and lateral chest 08/03/2013.  CT chest 04/27/2013.  FINDINGS: The lungs are clear. Heart size is normal. No  pneumothorax or pleural effusion. No focal bony abnormality is seen. Aortic atherosclerosis is noted.  IMPRESSION: No acute disease.   Electronically Signed   By: Inge Rise M.D.   On: 07/30/2015 15:22   Dg Forearm Right  07/30/2015   CLINICAL DATA:  Patient fell earlier today landing on right arm.  EXAM: RIGHT FOREARM - 2 VIEW  COMPARISON:  None.  FINDINGS: Frontal and lateral views were obtained. There are areas of soft tissue prominence dorsally, most likely representing soft tissue hematomas. No acute fracture or dislocation is apparent. There is extensive calcification in the radiocarpal joint. There is extensive osteoarthritic change in the scaphotrapezial and first carpal -metacarpal joints. There are foci of arterial vascular calcification.  IMPRESSION: No acute fracture or dislocation. Soft tissue hematomas dorsally. 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 Foci of arterial vascular calcification, primarily in the ulnar artery.   Electronically Signed   By: Lowella Grip III M.D.   On: 07/30/2015 15:20   Pelvis Portable  08/01/2015   CLINICAL DATA:  Status post ORIF  EXAM: PORTABLE PELVIS 1-2 VIEWS  COMPARISON:  None.  FINDINGS: Right medullary rod with proximal fixation screw is noted. The fracture fragments are in near anatomic alignment. No other focal abnormality is seen.  IMPRESSION: Status post ORIF of proximal right femoral fracture.   Electronically Signed   By: Inez Catalina M.D.   On: 08/01/2015 11:32   Dg Knee Complete 4 Views Right  07/30/2015   CLINICAL DATA:  Fall today. Right hip pain. Acute intertrochanteric fracture of the right hip.  EXAM: RIGHT KNEE - COMPLETE 4+ VIEW  COMPARISON:  09/29/2004  FINDINGS: Right total knee prosthesis observed without visible fracture in the vicinity of the knee. No hardware complication identified.  IMPRESSION: 1. Total knee prosthesis, without complicating feature observed.   Electronically Signed   By: Van Clines M.D.    On: 07/30/2015 15:21   Dg Hip Operative Unilat With Pelvis Right  08/01/2015   CLINICAL DATA:  Right proximal femoral fracture  EXAM: OPERATIVE right HIP (WITH PELVIS IF PERFORMED) 4 VIEWS  TECHNIQUE: Fluoroscopic spot image(s) were submitted for interpretation post-operatively.  COMPARISON:  None.  FINDINGS: Spot films were obtained reveal a medullary rod with fixation screw in the proximal femur. Fracture fragments are in near anatomic alignment.  IMPRESSION: Status post ORIF of proximal right femoral fracture.   Electronically Signed   By: Inez Catalina M.D.   On: 08/01/2015 10:27   Dg Femur Port, 1v Right  08/01/2015   CLINICAL DATA:  Intra medullary nail  EXAM: RIGHT FEMUR PORTABLE 1 VIEW  COMPARISON:  07/30/2015  FINDINGS: An intra medullary rod is now in place across and intertrochanteric femur fracture. The fracture is not clearly visualized on this study. Total knee arthroplasty is also noted.  IMPRESSION: ORIF right intertrochanteric femur fracture.   Electronically Signed   By: Marybelle Killings M.D.   On: 08/01/2015 11:23   Dg Hips Bilat With Pelvis 3-4 Views  07/30/2015   CLINICAL DATA:  Right hip pain and limited range of motion after falling when walking into a restaurant today.  EXAM: DG HIP (WITH OR WITHOUT PELVIS) 3-4V BILAT  COMPARISON:  None.  FINDINGS: Comminuted right intertrochanteric fracture with varus angulation and mild distraction of the major fragments. Mild left hip degenerative changes. Lower lumbar spine degenerative changes.  IMPRESSION: 1. Comminuted right intertrochanteric fracture with varus angulation. 2. Mild left hip and lower lumbar spine degenerative changes.   Electronically Signed   By: Claudie Revering M.D.   On: 07/30/2015 15:19     CBC  Recent Labs Lab 07/30/15 1539 07/31/15 0526 08/02/15 0418  WBC 12.4* 11.9* 11.4*  HGB 14.5 13.3 9.8*  HCT 42.8 39.4 29.2*  PLT 187 189 129*  MCV 90.7 91.4 93.3  MCH 30.7 30.9 31.3  MCHC 33.9 33.8 33.6  RDW 12.8 13.1  13.6   LYMPHSABS 1.5  --   --   MONOABS 0.7  --   --   EOSABS 0.1  --   --   BASOSABS 0.0  --   --     Chemistries   Recent Labs Lab 07/30/15 1539 07/31/15 0526 08/02/15 0418  NA 136 134* 131*  K 3.5 3.5 3.8  CL 96* 94* 92*  CO2 30 33* 33*  GLUCOSE 138* 137* 125*  BUN 20 18 24*  CREATININE 0.83 0.79 1.15*  CALCIUM 10.0 9.6 8.9  AST 25  --   --   ALT 21  --   --   ALKPHOS 104  --   --   BILITOT 1.5*  --   --    ------------------------------------------------------------------------------------------------------------------ estimated creatinine clearance is 30.5 mL/min (by C-G formula based on Cr of 1.15). ------------------------------------------------------------------------------------------------------------------ No results for input(s): HGBA1C in the last 72 hours. ------------------------------------------------------------------------------------------------------------------ No results for input(s): CHOL, HDL, LDLCALC, TRIG, CHOLHDL, LDLDIRECT in the last 72 hours. ------------------------------------------------------------------------------------------------------------------ No results for input(s): TSH, T4TOTAL, T3FREE, THYROIDAB in the last 72 hours.  Invalid input(s): FREET3 ------------------------------------------------------------------------------------------------------------------ No results for input(s): VITAMINB12, FOLATE, FERRITIN, TIBC, IRON, RETICCTPCT in the last 72 hours.  Coagulation profile  Recent Labs Lab 07/30/15 1551  INR 1.12    No results for input(s): DDIMER in the last 72 hours.  Cardiac Enzymes No results for input(s): CKMB, TROPONINI, MYOGLOBIN in the last 168 hours.  Invalid input(s): CK ------------------------------------------------------------------------------------------------------------------ Invalid input(s): POCBNP   Time Spent in minutes   35   Avyaan Summer K M.D on 08/02/2015 at 10:58 AM  Between 7am to  7pm - Pager - [phone]  After 7pm go to [email or web address] - password [facility]  [facility] -  Office  [phone]

## 2015-08-02 NOTE — Discharge Instructions (Addendum)
Shower:  May shower but keep the wounds dry, use an occlusive plastic wrap, NO SOAKING IN TUB.  If the bandage gets wet, change with a clean dry gauze.  If you have a splint on, leave the splint in place and keep the splint dry with a plastic bag.  Dressing:  You may change your dressing 3-5 days after surgery, unless you have a splint.  If you have a splint, then just leave the splint in place and we will change your bandages during your first follow-up appointment.    If you had hand or foot surgery, we will plan to remove your stitches in about 2 weeks in the office.  For all other surgeries, there are sticky tapes (steri-strips) on your wounds and all the stitches are absorbable.  Leave the steri-strips in place when changing your dressings, they will peel off with time, usually 2-3 weeks.  Activity:  Increase activity slowly as tolerated, but follow the weight bearing instructions below.  The rules on driving is that you can not be taking narcotics while you drive, and you must feel in control of the vehicle.    Weight Bearing:   As tolerated.    Follow with Primary Leah Holland Leah Holland, TAMMY, Leah Holland in 7 days   Get CBC, CMP, 2 view Chest X ray checked  by SNF Leah Holland in 4 days.   Disposition SNF   Diet: Heart Healthy  with feeding assistance and aspiration precautions.  For Heart failure patients - Check your Weight same time everyday, if you gain over 2 pounds, or you develop in leg swelling, experience more shortness of breath or chest pain, call your Primary Leah Holland immediately. Follow Cardiac Low Salt Diet and 1.5 lit/day fluid restriction.   On your next visit with your primary care physician please Get Medicines reviewed and adjusted.   Please request your Prim.Leah Holland to go over all Hospital Tests and Procedure/Radiological results at the follow up, please get all Hospital records sent to your Prim Leah Holland by signing hospital release before you go home.   If you experience worsening of your admission symptoms,  develop shortness of breath, life threatening emergency, suicidal or homicidal thoughts you must seek medical attention immediately by calling 911 or calling your Leah Holland immediately  if symptoms less severe.  You Must read complete instructions/literature along with all the possible adverse reactions/side effects for all the Medicines you take and that have been prescribed to you. Take any new Medicines after you have completely understood and accpet all the possible adverse reactions/side effects.   Do not drive, operating heavy machinery, perform activities at heights, swimming or participation in water activities or provide baby sitting services if your were admitted for syncope or siezures until you have seen by Primary Leah Holland or a Neurologist and advised to do so again.  Do not drive when taking Pain medications.    Do not take more than prescribed Pain, Sleep and Anxiety Medications  Special Instructions: If you have smoked or chewed Tobacco  in the last 2 yrs please stop smoking, stop any regular Alcohol  and or any Recreational drug use.  Wear Seat belts while driving.   Please note  You were cared for by a hospitalist during your hospital stay. If you have any questions about your discharge medications or the care you received while you were in the hospital after you are discharged, you can call the unit and asked to speak with the hospitalist on call if the hospitalist that took  care of you is not available. Once you are discharged, your primary care physician will handle any further medical issues. Please note that NO REFILLS for any discharge medications will be authorized once you are discharged, as it is imperative that you return to your primary care physician (or establish a relationship with a primary care physician if you do not have one) for your aftercare needs so that they can reassess your need for medications and monitor your lab values.

## 2015-08-02 NOTE — Evaluation (Signed)
Physical Therapy Evaluation Patient Details Name: Leah Holland MRN: [id number] DOB: Nov 09, 1922 Today's Date: 08/02/2015   History of Present Illness  Patient is a 81 yo female admitted 07/30/15 following mechanical fall, with Rt hip fx.  Patient now s/p IM nailing on 08/01/15.    PMH:  Afib, HTN, DM  Clinical Impression  Patient presents with problems listed below.  Will benefit from acute PT to maximize functional mobility prior to discharge.  Patient very active and independent pta.  Patient lives alone. Recommend SNF at discharge for continued therapy.    Follow Up Recommendations SNF;Supervision/Assistance - 24 hour    Equipment Recommendations  3in1 (PT)    Recommendations for Other Services       Precautions / Restrictions Precautions Precautions: Fall Restrictions Weight Bearing Restrictions: Yes RLE Weight Bearing: Weight bearing as tolerated      Mobility  Bed Mobility Overal bed mobility: Needs Assistance Bed Mobility: Supine to Sit     Supine to sit: Min assist     General bed mobility comments: Verbal cues for technique.  Assist to move RLE to edge of bed.  Assist to raise trunk to sitting position.  Transfers Overall transfer level: Needs assistance Equipment used: Rolling walker (2 wheeled) Transfers: Sit to/from [facility] Sit to Stand: Max assist Stand pivot transfers: Mod assist;+2 physical assistance       General transfer comment: Verbal cues for hand placement and technique.  Patient required max assist to power up to standing and to maintain balance.  Patient with posterior lean in stance.  Required mod assist of 2 to pivot toward chair.  Patient with difficulty using UE's on RW to help with stepping.  Assist to control descent into chair.  Patient reported increased pain and nausea during transfer.  Ambulation/Gait                Stairs            Wheelchair Mobility    Modified Rankin (Stroke Patients  Only)       Balance Overall balance assessment: Needs assistance Sitting-balance support: Bilateral upper extremity supported;Feet supported Sitting balance-Leahy Scale: Fair     Standing balance support: Bilateral upper extremity supported Standing balance-Leahy Scale: Poor Standing balance comment: Patient with posterior lean during stance.                             Pertinent Vitals/Pain Pain Assessment: 0-10 Pain Score: 10-Worst pain ever (During transfers/mobility) Pain Location: Rt hip Pain Descriptors / Indicators: Aching;Sore;Sharp Pain Intervention(s): Limited activity within patient's tolerance;Monitored during session;Premedicated before session;Repositioned    Home Living Family/patient expects to be discharged to:: Skilled nursing facility Living Arrangements: Alone (In ILF)             Home Equipment: Walker - 2 wheels;Cane - single point;Grab bars - tub/shower;Other (comment) (Life Alert)      Prior Function Level of Independence: Independent with assistive device(s)         Comments: Patient used cane outside on unlevel surface (in grass or garden)     Hand Dominance        Extremity/Trunk Assessment   Upper Extremity Assessment: Generalized weakness           Lower Extremity Assessment: Generalized weakness;RLE deficits/detail RLE Deficits / Details: Decreased strength and ROM due to injury/pain    Cervical / Trunk Assessment: Normal  Communication   Communication: No difficulties  Cognition  Arousal/Alertness: Awake/alert Behavior During Therapy: Anxious Overall Cognitive Status: Within Functional Limits for tasks assessed                      General Comments      Exercises General Exercises - Lower Extremity Ankle Circles/Pumps: AROM;Both;10 reps;Seated      Assessment/Plan    PT Assessment Patient needs continued PT services  PT Diagnosis Difficulty walking;Generalized weakness;Acute pain   PT  Problem List Decreased strength;Decreased activity tolerance;Decreased balance;Decreased range of motion;Decreased mobility;Decreased knowledge of use of DME;Pain  PT Treatment Interventions DME instruction;Gait training;Functional mobility training;Therapeutic activities;Therapeutic exercise;Patient/family education   PT Goals (Current goals can be found in the Care Plan section) Acute Rehab PT Goals Patient Stated Goal: To decrease pain PT Goal Formulation: With patient Time For Goal Achievement: 08/16/15 Potential to Achieve Goals: Good    Frequency Min 5X/week   Barriers to discharge Decreased caregiver support Patient lives alone.    Co-evaluation               End of Session Equipment Utilized During Treatment: Gait belt Activity Tolerance: Patient limited by pain;Patient limited by fatigue Patient left: in chair;with call bell/phone within reach Nurse Communication: Mobility status         Time: 4481-8563 PT Time Calculation (min) (ACUTE ONLY): 20 min   Charges:   PT Evaluation $Initial PT Evaluation Tier I: 1 Procedure     PT G CodesDespina Pole 08/09/15, 3:23 PM Carita Pian. Sanjuana Kava, [place] Pager [phone]

## 2015-08-02 NOTE — Progress Notes (Signed)
Pt OOB to [facility] pt stated she was dizzy and blacked out for a few moments. Pt returned back to bed.  BP 115/40 Spo2 91% 2L HR 76.

## 2015-08-02 NOTE — Progress Notes (Signed)
Utilization Review Completed.Leah Holland T8/21/2016  

## 2015-08-02 NOTE — Progress Notes (Signed)
[name], female   DOB: [birth year], 81 y.o.   MRN: [id number]     Subjective:  Patient reports pain as mild to moderate.  Patient reports improvement but continues to have pain  Objective:   VITALS:   Filed Vitals:   08/02/15 0135 08/02/15 0500 08/02/15 0905 08/02/15 0954  BP: 133/50 122/41 115/40 104/42  Pulse: 74 69 76 73  Temp: 97.8 F (36.6 C) 98.1 F (36.7 C)  98.2 F (36.8 C)  TempSrc: Oral Oral  Oral  Resp: 20 20 20    Height:      Weight:  66.5 kg (146 lb 9.7 oz)    SpO2: 95% 99% 91% 96%    ABD soft Sensation intact distally Dorsiflexion/Plantar flexion intact Incision: dressing C/D/I and scant drainage Good foot and ankle motion  Lab Results  Component Value Date   WBC 11.4* 08/02/2015   HGB 9.8* 08/02/2015   HCT 29.2* 08/02/2015   MCV 93.3 08/02/2015   PLT 129* 08/02/2015   BMET    Component Value Date/Time   NA 131* 08/02/2015 0418   K 3.8 08/02/2015 0418   CL 92* 08/02/2015 0418   CO2 33* 08/02/2015 0418   GLUCOSE 125* 08/02/2015 0418   BUN 24* 08/02/2015 0418   CREATININE 1.15* 08/02/2015 0418   CALCIUM 8.9 08/02/2015 0418   GFRNONAA 41* 08/02/2015 0418   GFRAA 47* 08/02/2015 0418     Assessment/Plan: 1 Day Post-Op   Principal Problem:   Intertrochanteric fracture of right hip Active Problems:   Hypertension   Cataract   Hypothyroidism   Insomnia   Right carotid bruit   Paroxysmal atrial fibrillation   Coagulopathy   Skin cancer of face   Complication of anesthesia   Advance diet Up with therapy Continue plan per medicine  WBAT Dry dressing PRN    DOUGLAS PARRY, BRANDON 08/02/2015, 11:08 AM  Discussed and agree with above.   Marchia Bond, MD Cell [phone]

## 2015-08-03 DIAGNOSIS — D689 Coagulation defect, unspecified: Secondary | ICD-10-CM

## 2015-08-03 DIAGNOSIS — Z8781 Personal history of (healed) traumatic fracture: Secondary | ICD-10-CM | POA: Diagnosis not present

## 2015-08-03 DIAGNOSIS — R2681 Unsteadiness on feet: Secondary | ICD-10-CM | POA: Diagnosis not present

## 2015-08-03 DIAGNOSIS — M84359S Stress fracture, hip, unspecified, sequela: Secondary | ICD-10-CM | POA: Diagnosis not present

## 2015-08-03 DIAGNOSIS — S72141A Displaced intertrochanteric fracture of right femur, initial encounter for closed fracture: Secondary | ICD-10-CM | POA: Insufficient documentation

## 2015-08-03 DIAGNOSIS — Z7901 Long term (current) use of anticoagulants: Secondary | ICD-10-CM | POA: Diagnosis not present

## 2015-08-03 DIAGNOSIS — M25551 Pain in right hip: Secondary | ICD-10-CM | POA: Diagnosis not present

## 2015-08-03 DIAGNOSIS — F3161 Bipolar disorder, current episode mixed, mild: Secondary | ICD-10-CM | POA: Diagnosis not present

## 2015-08-03 DIAGNOSIS — E871 Hypo-osmolality and hyponatremia: Secondary | ICD-10-CM | POA: Diagnosis not present

## 2015-08-03 DIAGNOSIS — Z967 Presence of other bone and tendon implants: Secondary | ICD-10-CM

## 2015-08-03 DIAGNOSIS — R1311 Dysphagia, oral phase: Secondary | ICD-10-CM | POA: Diagnosis not present

## 2015-08-03 DIAGNOSIS — E785 Hyperlipidemia, unspecified: Secondary | ICD-10-CM | POA: Diagnosis not present

## 2015-08-03 DIAGNOSIS — E119 Type 2 diabetes mellitus without complications: Secondary | ICD-10-CM | POA: Diagnosis not present

## 2015-08-03 DIAGNOSIS — R4189 Other symptoms and signs involving cognitive functions and awareness: Secondary | ICD-10-CM | POA: Diagnosis not present

## 2015-08-03 DIAGNOSIS — M1990 Unspecified osteoarthritis, unspecified site: Secondary | ICD-10-CM | POA: Diagnosis not present

## 2015-08-03 DIAGNOSIS — I480 Paroxysmal atrial fibrillation: Secondary | ICD-10-CM | POA: Diagnosis not present

## 2015-08-03 DIAGNOSIS — K219 Gastro-esophageal reflux disease without esophagitis: Secondary | ICD-10-CM | POA: Diagnosis not present

## 2015-08-03 DIAGNOSIS — R0602 Shortness of breath: Secondary | ICD-10-CM | POA: Diagnosis not present

## 2015-08-03 DIAGNOSIS — I10 Essential (primary) hypertension: Secondary | ICD-10-CM | POA: Diagnosis not present

## 2015-08-03 DIAGNOSIS — G4700 Insomnia, unspecified: Secondary | ICD-10-CM | POA: Diagnosis not present

## 2015-08-03 DIAGNOSIS — E039 Hypothyroidism, unspecified: Secondary | ICD-10-CM | POA: Diagnosis not present

## 2015-08-03 DIAGNOSIS — Z4789 Encounter for other orthopedic aftercare: Secondary | ICD-10-CM | POA: Diagnosis not present

## 2015-08-03 DIAGNOSIS — D62 Acute posthemorrhagic anemia: Secondary | ICD-10-CM | POA: Diagnosis not present

## 2015-08-03 DIAGNOSIS — R498 Other voice and resonance disorders: Secondary | ICD-10-CM | POA: Diagnosis not present

## 2015-08-03 DIAGNOSIS — S72141D Displaced intertrochanteric fracture of right femur, subsequent encounter for closed fracture with routine healing: Secondary | ICD-10-CM | POA: Diagnosis not present

## 2015-08-03 DIAGNOSIS — Z9889 Other specified postprocedural states: Secondary | ICD-10-CM | POA: Insufficient documentation

## 2015-08-03 DIAGNOSIS — K5900 Constipation, unspecified: Secondary | ICD-10-CM | POA: Diagnosis not present

## 2015-08-03 DIAGNOSIS — M6281 Muscle weakness (generalized): Secondary | ICD-10-CM | POA: Diagnosis not present

## 2015-08-03 DIAGNOSIS — R29898 Other symptoms and signs involving the musculoskeletal system: Secondary | ICD-10-CM | POA: Diagnosis not present

## 2015-08-03 DIAGNOSIS — R1313 Dysphagia, pharyngeal phase: Secondary | ICD-10-CM | POA: Diagnosis not present

## 2015-08-03 DIAGNOSIS — R531 Weakness: Secondary | ICD-10-CM | POA: Diagnosis not present

## 2015-08-03 LAB — BASIC METABOLIC PANEL
Anion gap: 7 (ref 5–15)
BUN: 30 mg/dL — AB (ref 6–20)
CHLORIDE: 94 mmol/L — AB (ref 101–111)
CO2: 30 mmol/L (ref 22–32)
CREATININE: 1.13 mg/dL — AB (ref 0.44–1.00)
Calcium: 8.2 mg/dL — ABNORMAL LOW (ref 8.9–10.3)
GFR, EST AFRICAN AMERICAN: 48 mL/min — AB (ref >60)
GFR, EST NON AFRICAN AMERICAN: 41 mL/min — AB (ref >60)
Glucose, Bld: 117 mg/dL — ABNORMAL HIGH (ref 65–99)
POTASSIUM: 3.3 mmol/L — AB (ref 3.5–5.1)
SODIUM: 131 mmol/L — AB (ref 135–145)

## 2015-08-03 LAB — VITAMIN B12: VITAMIN B 12: 512 pg/mL (ref 180–914)

## 2015-08-03 LAB — URINE CULTURE: Culture: NO GROWTH

## 2015-08-03 LAB — IRON AND TIBC
Iron: 16 ug/dL — ABNORMAL LOW (ref 28–170)
Saturation Ratios: 7 % — ABNORMAL LOW (ref 10.4–31.8)
TIBC: 238 ug/dL — ABNORMAL LOW (ref 250–450)
UIBC: 222 ug/dL

## 2015-08-03 LAB — CBC
HCT: 22.5 % — ABNORMAL LOW (ref 36.0–46.0)
HEMOGLOBIN: 7.8 g/dL — AB (ref 12.0–15.0)
MCH: 31.3 pg (ref 26.0–34.0)
MCHC: 34.7 g/dL (ref 30.0–36.0)
MCV: 90.4 fL (ref 78.0–100.0)
PLATELETS: 116 10*3/uL — AB (ref 150–400)
RBC: 2.49 MIL/uL — AB (ref 3.87–5.11)
RDW: 13.4 % (ref 11.5–15.5)
WBC: 9.9 10*3/uL (ref 4.0–10.5)

## 2015-08-03 LAB — RETICULOCYTES
RBC.: 2.62 MIL/uL — AB (ref 3.87–5.11)
RETIC COUNT ABSOLUTE: 57.6 10*3/uL (ref 19.0–186.0)
RETIC CT PCT: 2.2 % (ref 0.4–3.1)

## 2015-08-03 LAB — FERRITIN: Ferritin: 151 ng/mL (ref 11–307)

## 2015-08-03 LAB — PREPARE RBC (CROSSMATCH)

## 2015-08-03 LAB — FOLATE: FOLATE: 20.5 ng/mL (ref >5.9)

## 2015-08-03 MED ORDER — ACETAMINOPHEN 325 MG PO TABS
650.0000 mg | ORAL_TABLET | Freq: Once | ORAL | Status: AC
  Administered 2015-08-03: 650 mg via ORAL
  Filled 2015-08-03: qty 2

## 2015-08-03 MED ORDER — FLAXSEED OIL 1000 MG PO CAPS: ORAL_CAPSULE | ORAL | Status: DC

## 2015-08-03 MED ORDER — FERROUS SULFATE 325 (65 FE) MG PO TABS: 325.0000 mg | ORAL_TABLET | Freq: Two times a day (BID) | ORAL | Status: DC

## 2015-08-03 MED ORDER — HYDROCODONE-ACETAMINOPHEN 5-325 MG PO TABS: 1.0000 | ORAL_TABLET | Freq: Four times a day (QID) | ORAL | Status: DC | PRN

## 2015-08-03 MED ORDER — FUROSEMIDE 10 MG/ML IJ SOLN
20.0000 mg | Freq: Once | INTRAMUSCULAR | Status: AC
  Administered 2015-08-03: 20 mg via INTRAVENOUS
  Filled 2015-08-03: qty 2

## 2015-08-03 MED ORDER — POTASSIUM CHLORIDE CRYS ER 20 MEQ PO TBCR
40.0000 meq | EXTENDED_RELEASE_TABLET | Freq: Once | ORAL | Status: AC
  Administered 2015-08-03: 40 meq via ORAL
  Filled 2015-08-03: qty 2

## 2015-08-03 MED ORDER — BENAZEPRIL HCL 40 MG PO TABS: 40.0000 mg | ORAL_TABLET | Freq: Every evening | ORAL | Status: DC

## 2015-08-03 MED ORDER — MACULAR VITAMIN BENEFIT PO TABS
ORAL_TABLET | ORAL | Status: DC
Start: 2015-08-03 — End: 2017-02-03

## 2015-08-03 MED ORDER — TEMAZEPAM 30 MG PO CAPS: 30.0000 mg | ORAL_CAPSULE | Freq: Every evening | ORAL | Status: DC | PRN

## 2015-08-03 MED ORDER — SODIUM CHLORIDE 0.9 % IV SOLN
Freq: Once | INTRAVENOUS | Status: AC
  Administered 2015-08-03: 10 mL/h via INTRAVENOUS

## 2015-08-03 MED ORDER — ESOMEPRAZOLE MAGNESIUM 40 MG PO CPDR: 40.0000 mg | DELAYED_RELEASE_CAPSULE | Freq: Every day | ORAL | Status: DC

## 2015-08-03 MED ORDER — ALUM & MAG HYDROXIDE-SIMETH 200-200-20 MG/5ML PO SUSP
30.0000 mL | Freq: Once | ORAL | Status: AC
  Administered 2015-08-03: 30 mL via ORAL
  Filled 2015-08-03: qty 30

## 2015-08-03 NOTE — Clinical Social Work Placement (Signed)
   CLINICAL SOCIAL WORK PLACEMENT  NOTE  Date:  08/03/2015  Patient Details  Name: Leah Holland MRN: [id number] Date of Birth: [birth year]  Clinical Social Work is seeking post-discharge placement for this patient at the [place] level of care (*CSW will initial, date and re-position this form in  chart as items are completed):  Yes   Patient/family provided with [facility] [place] Work Department's list of facilities offering this level of care within the geographic area requested by the patient (or if unable, by the patient's family).  Yes   Patient/family informed of their freedom to choose among providers that offer the needed level of care, that participate in Medicare, Medicaid or managed care program needed by the patient, have an available bed and are willing to accept the patient.  Yes   Patient/family informed of [facility]'s ownership interest in [facility] and [facility], as well as of the fact that they are under no obligation to receive care at these facilities.  PASRR submitted to EDS on       PASRR number received on  (previously issued)     Existing PASRR number confirmed on 08/03/15     FL2 transmitted to all facilities in geographic area requested by pt/family on 08/03/15     FL2 transmitted to all facilities within larger geographic area on 08/01/15     Patient informed that his/her managed care company has contracts with or will negotiate with certain facilities, including the following:        Yes   Patient/family informed of bed offers received.  Patient chooses bed at [facility] [address]     Physician recommends and patient chooses bed at      Patient to be transferred to [facility] on 08/03/15.  Patient to be transferred to facility by [facility]     Patient family notified on 08/03/15 of transfer.  Name of family member notified:  Ponciano Ort     PHYSICIAN       Additional Comment:     _______________________________________________ [name], [place]

## 2015-08-03 NOTE — Progress Notes (Signed)
OT Cancellation Note  Patient Details Name: Leah Holland MRN: [id number] DOB: 03/27/1922   Cancelled Treatment:    Reason Eval/Treat Not Completed: Other (comment) Pt is Medicare and current D/C plan is SNF. No apparent immediate acute care OT needs, therefore will defer OT to SNF. If OT eval is needed please call Acute Rehab Dept. at [phone] or text page OT at [phone].    Almon Register [id number] 08/03/2015, 7:03 AM

## 2015-08-03 NOTE — Discharge Summary (Addendum)
Leah Holland, is a 81 y.o. female  DOB 08-08-1922  MRN [id number].  Admission date:  07/30/2015  Admitting Physician  Nita Sells, MD  Discharge Date:  08/03/2015   Primary MD  Florina Ou, MD  Recommendations for primary care physician for things to follow:   Check CBC, BMP in 4-5 days. Monitor blood pressure closely.  Check anemia panel in 1 week.   Admission Diagnosis  Fall [W19.XXXA] Fracture, intertrochanteric, right femur, closed, initial encounter [S72.141A]   Discharge Diagnosis  Fall [W19.XXXA] Fracture, intertrochanteric, right femur, closed, initial encounter [S72.141A]     Principal Problem:   Intertrochanteric fracture of right hip Active Problems:   Hypertension   Cataract   Hypothyroidism   Insomnia   Right carotid bruit   Paroxysmal atrial fibrillation   Coagulopathy   Skin cancer of face   Complication of anesthesia   Fracture, intertrochanteric, right femur   S/P ORIF (open reduction internal fixation) fracture      Past Medical History  Diagnosis Date  . Complication of anesthesia   . PONV (postoperative nausea and vomiting)   . Hypertension   . Hyperlipidemia   . Swelling of extremity     sees Dr. Gwenlyn Found, cardiologist 1 x year [phone]  . Bronchitis 12/2011  . Hypothyroidism     takes synthroid  . GERD (gastroesophageal reflux disease)   . Neuromuscular disorder     carpal tunnel in right hand  . Insomnia   . Hyperlipidemia   . Skin cancer of face 2000's    "one on each side of my face and one on my nose" (08/13/2013)  . Diabetes mellitus type 2, diet-controlled   . History of blood transfusion     "w/both knee OR's" (08/13/2013)  . H/O hiatal hernia   . Arthritis     "probably" (08/13/2013)  . Atrial fibrillation     new onset/pt report 08/13/2013  .  Depression   . Anxiety attack     "take RX prn" (08/13/2013)  . Intertrochanteric fracture of right hip 07/30/2015    Past Surgical History  Procedure Laterality Date  . Total knee arthroplasty Right 2005  . Carpal tunnel release Right ~ 2005  . Eye surgery    . Breast biopsy Left ?1970's  . Cardiovascular stress test  12/07/11  . Doppler echocardiography  12/07/11  . Total knee arthroplasty  03/02/2012    Procedure: TOTAL KNEE ARTHROPLASTY;  Surgeon: Yvette Rack., MD;  Location: [facility];  Service: Orthopedics;  Laterality: Left;  . Joint replacement      bilateral knee replacement  . Cataract extraction, bilateral Bilateral 2000  . Skin cancer excision  2000's    "off my nose" (08/13/2013)  . Dilation and curettage of uterus  1950's  . Cardiovascular stress test  12/07/2011    LEXISCAN, normal scan, no significant wall abnormalities noted  . 2d echocardiogram  12/07/2004    EF 48%, moderate pulmonary hypertension  . Femur im nail Right 08/01/2015    Procedure:  INTRAMEDULLARY (IM) NAIL FEMORAL;  Surgeon: Marchia Bond, MD;  Location: [facility];  Service: Orthopedics;  Laterality: Right;       HPI  from the history and physical done on the day of admission:    She was with family going to new restaurant [employer]" at around 1:00 this afternoon after the service with family members and stepped wrong with her high heel shoes into one of the cracks on the pavement and fell backward onto her right forearm as well as onto her right hip. She sustained immediate pain. She did not have any syncope or presyncopal episodes, did not feel weak and did not feel any chest pain or shortness of breath or any other issues at that time, This was purely a mechanical fall.     Hospital Course:     1. Mechanical fall with right femoral intertrochanteric fracture. Status post open reduction internal fixation by Dr. [name] on 08/01/2015, seems to have tolerated the procedure well. Weightbearing as  tolerated, Eliquis for DVT prophylaxis confirmed with Dr. [name] to be started now. PT. Will require SNF.   Did have some preoperative blood loss anemia, also element of hemodilution from IV fluids. No bleeding around the urge to go scar hemoglobin still close to 7.8. We'll place on oral iron. Request SNF M.D. to check CBC and BMP in the next 3-4 days. Repeat anemia panel in a week.   2. Reactionary leukocytosis due to #1 above. Stable no acute issues. Remained afebrile with stable UA and chest x-ray. Leukocytosis has resolved.   3. Proximal atrial fibrillation with [place] Vasc 2 of > 3 - resume diltiazem, resume beta blocker. Eliquis to be restarted confirmed with Dr. [name]. Pharmacy to monitor.   4. Dyslipidemia. Continue home dose statin.   5. Essential hypertension. Stable on combination of Cardizem, beta blocker along with diuretic and ACE inhibitor.   6. Bipolar disorder. Continue fluoxetine and temazepam.   7. Hypernatremia with mild AKI due to dehydration. Urine sodium less than 10, which improved with hydration, skip ACE inhibitor another 3 days. Monitor blood pressure and BMP closely.   8. DM type II diet controlled. Stable.       Discharge Condition: Stable  Follow UP  Follow-up Information    Follow up with Johnny Bridge, MD. Schedule an appointment as soon as possible for a visit in 2 weeks.   Specialty:  Orthopedic Surgery   Contact information:   [address] [place] [id number] [phone]       Follow up with Florina Ou, MD. Schedule an appointment as soon as possible for a visit in 1 week.   Specialty:  Family Medicine   Contact information:   [address] [place] [address] [phone]       Please follow up.   Why:  MD AT SNF WILL FOLLOW       Consults obtained - Ortho  Diet and Activity recommendation: See Discharge Instructions below  Discharge Instructions           Discharge Instructions    Diet - low sodium heart healthy    Complete by:  As directed      Discharge instructions    Complete by:  As directed   Shower:  May shower but keep the wounds dry, use an occlusive plastic wrap, NO SOAKING IN TUB.  If the bandage gets wet, change with a clean dry gauze.  If you have a  splint on, leave the splint in place and keep the splint dry with a plastic bag.  Dressing:  You may change your dressing 3-5 days after surgery, unless you have a splint.  If you have a splint, then just leave the splint in place and we will change your bandages during your first follow-up appointment.    If you had hand or foot surgery, we will plan to remove your stitches in about 2 weeks in the office.  For all other surgeries, there are sticky tapes (steri-strips) on your wounds and all the stitches are absorbable.  Leave the steri-strips in place when changing your dressings, they will peel off with time, usually 2-3 weeks.  Activity:  Increase activity slowly as tolerated, but follow the weight bearing instructions below.  The rules on driving is that you can not be taking narcotics while you drive, and you must feel in control of the vehicle.    Weight Bearing:   As tolerated.    Follow with Primary MD Modena Morrow, TAMMY, MD in 7 days   Get CBC, CMP, 2 view Chest X ray checked  by SNF MD in 4 days.   Disposition SNF   Diet: Heart Healthy  with feeding assistance and aspiration precautions.  For Heart failure patients - Check your Weight same time everyday, if you gain over 2 pounds, or you develop in leg swelling, experience more shortness of breath or chest pain, call your Primary MD immediately. Follow Cardiac Low Salt Diet and 1.5 lit/day fluid restriction.   On your next visit with your primary care physician please Get Medicines reviewed and adjusted.   Please request your Prim.MD to go over all Hospital Tests and Procedure/Radiological results at the follow up, please  get all Hospital records sent to your Prim MD by signing hospital release before you go home.   If you experience worsening of your admission symptoms, develop shortness of breath, life threatening emergency, suicidal or homicidal thoughts you must seek medical attention immediately by calling 911 or calling your MD immediately  if symptoms less severe.  You Must read complete instructions/literature along with all the possible adverse reactions/side effects for all the Medicines you take and that have been prescribed to you. Take any new Medicines after you have completely understood and accpet all the possible adverse reactions/side effects.   Do not drive, operating heavy machinery, perform activities at heights, swimming or participation in water activities or provide baby sitting services if your were admitted for syncope or siezures until you have seen by Primary MD or a Neurologist and advised to do so again.  Do not drive when taking Pain medications.    Do not take more than prescribed Pain, Sleep and Anxiety Medications  Special Instructions: If you have smoked or chewed Tobacco  in the last 2 yrs please stop smoking, stop any regular Alcohol  and or any Recreational drug use.  Wear Seat belts while driving.   Please note  You were cared for by a hospitalist during your hospital stay. If you have any questions about your discharge medications or the care you received while you were in the hospital after you are discharged, you can call the unit and asked to speak with the hospitalist on call if the hospitalist that took care of you is not available. Once you are discharged, your primary care physician will handle any further medical issues. Please note that NO REFILLS for any discharge medications will be authorized once you are  discharged, as it is imperative that you return to your primary care physician (or establish a relationship with a primary care physician if you do not have  one) for your aftercare needs so that they can reassess your need for medications and monitor your lab values.     Increase activity slowly    Complete by:  As directed      Weight bearing as tolerated    Complete by:  As directed              Discharge Medications       Medication List    TAKE these medications        apixaban 2.5 MG Tabs tablet  Commonly known as:  ELIQUIS  Take 1 tablet (2.5 mg total) by mouth 2 (two) times daily.     aspirin EC 81 MG tablet  Take 81 mg by mouth daily.     atorvastatin 80 MG tablet  Commonly known as:  LIPITOR  Take 80 mg by mouth daily.     benazepril 40 MG tablet  Commonly known as:  LOTENSIN  Take 1 tablet (40 mg total) by mouth every evening.  Start taking on:  08/06/2015     chlorthalidone 25 MG tablet  Commonly known as:  HYGROTON  TAKE 1 TABLET ONCE DAILY.     diltiazem 180 MG 24 hr capsule  Commonly known as:  CARDIZEM CD  TAKE 1 CAPSULE DAILY.     esomeprazole 40 MG capsule  Commonly known as:  NEXIUM  Take 1 capsule (40 mg total) by mouth daily at 12 noon.     ferrous sulfate 325 (65 FE) MG tablet  Take 1 tablet (325 mg total) by mouth 2 (two) times daily with a meal.     Flaxseed Oil 1000 MG Caps  1 pill po bid     FLUoxetine 20 MG capsule  Commonly known as:  PROZAC  Take 1 capsule by mouth daily.     furosemide 20 MG tablet  Commonly known as:  LASIX  Take 1 tablet (20 mg total) by mouth as needed for edema.     HYDROcodone-acetaminophen 5-325 MG per tablet  Commonly known as:  NORCO  Take 1 tablet by mouth every 6 (six) hours as needed for moderate pain. MAXIMUM TOTAL ACETAMINOPHEN DOSE IS 4000 MG PER DAY     levothyroxine 100 MCG tablet  Commonly known as:  SYNTHROID, LEVOTHROID  Take 100 mcg by mouth daily.     MACULAR VITAMIN BENEFIT Tabs  1 pill by mouth twice a day. There is no dose for multivitamin.     metoprolol 50 MG tablet  Commonly known as:  LOPRESSOR  Take 50 mg by mouth 2 (two)  times daily.     potassium chloride 10 MEQ tablet  Commonly known as:  K-DUR  Take 1 tablet (10 mEq total) by mouth as needed.     Probiotic Caps  Take 1 capsule by mouth daily as needed (diarrhea).     sennosides-docusate sodium 8.6-50 MG tablet  Commonly known as:  SENOKOT-S  Take 2 tablets by mouth daily.     SYSTANE 0.4-0.3 % Soln  Generic drug:  Polyethyl Glycol-Propyl Glycol  Apply 1 drop to eye 3 (three) times daily as needed (dry eye).     temazepam 30 MG capsule  Commonly known as:  RESTORIL  Take 1 capsule (30 mg total) by mouth at bedtime as needed for sleep.     TYLENOL  325 MG tablet  Generic drug:  acetaminophen  Take 650 mg by mouth every 6 (six) hours as needed for pain.        Major procedures and Radiology Reports - PLEASE review detailed and final reports for all details, in brief -   ORIF R Femur   Dg Chest 1 View  07/30/2015   CLINICAL DATA:  Status post fall today.  Hip pain.  EXAM: CHEST  1 VIEW  COMPARISON:  PA and lateral chest 08/03/2013.  CT chest 04/27/2013.  FINDINGS: The lungs are clear. Heart size is normal. No pneumothorax or pleural effusion. No focal bony abnormality is seen. Aortic atherosclerosis is noted.  IMPRESSION: No acute disease.   Electronically Signed   By: Inge Rise M.D.   On: 07/30/2015 15:22   Dg Forearm Right  07/30/2015   CLINICAL DATA:  Patient fell earlier today landing on right arm.  EXAM: RIGHT FOREARM - 2 VIEW  COMPARISON:  None.  FINDINGS: Frontal and lateral views were obtained. There are areas of soft tissue prominence dorsally, most likely representing soft tissue hematomas. No acute fracture or dislocation is apparent. There is extensive calcification in the radiocarpal joint. There is extensive osteoarthritic change in the scaphotrapezial and first carpal -metacarpal joints. There are foci of arterial vascular calcification.  IMPRESSION: No acute fracture or dislocation. Soft tissue hematomas dorsally. Multifocal  osteoarthritic change in the wrist region. Foci of arterial vascular calcification, primarily in the ulnar artery.   Electronically Signed   By: Lowella Grip III M.D.   On: 07/30/2015 15:20   Pelvis Portable  08/01/2015   CLINICAL DATA:  Status post ORIF  EXAM: PORTABLE PELVIS 1-2 VIEWS  COMPARISON:  None.  FINDINGS: Right medullary rod with proximal fixation screw is noted. The fracture fragments are in near anatomic alignment. No other focal abnormality is seen.  IMPRESSION: Status post ORIF of proximal right femoral fracture.   Electronically Signed   By: Inez Catalina M.D.   On: 08/01/2015 11:32   Dg Knee Complete 4 Views Right  07/30/2015   CLINICAL DATA:  Fall today. Right hip pain. Acute intertrochanteric fracture of the right hip.  EXAM: RIGHT KNEE - COMPLETE 4+ VIEW  COMPARISON:  09/29/2004  FINDINGS: Right total knee prosthesis observed without visible fracture in the vicinity of the knee. No hardware complication identified.  IMPRESSION: 1. Total knee prosthesis, without complicating feature observed.   Electronically Signed   By: Van Clines M.D.   On: 07/30/2015 15:21   Dg Hip Operative Unilat With Pelvis Right  08/01/2015   CLINICAL DATA:  Right proximal femoral fracture  EXAM: OPERATIVE right HIP (WITH PELVIS IF PERFORMED) 4 VIEWS  TECHNIQUE: Fluoroscopic spot image(s) were submitted for interpretation post-operatively.  COMPARISON:  None.  FINDINGS: Spot films were obtained reveal a medullary rod with fixation screw in the proximal femur. Fracture fragments are in near anatomic alignment.  IMPRESSION: Status post ORIF of proximal right femoral fracture.   Electronically Signed   By: Inez Catalina M.D.   On: 08/01/2015 10:27   Dg Femur Port, 1v Right  08/01/2015   CLINICAL DATA:  Intra medullary nail  EXAM: RIGHT FEMUR PORTABLE 1 VIEW  COMPARISON:  07/30/2015  FINDINGS: An intra medullary rod is now in place across and intertrochanteric femur fracture. The fracture is not clearly  visualized on this study. Total knee arthroplasty is also noted.  IMPRESSION: ORIF right intertrochanteric femur fracture.   Electronically Signed   By: Arnell Sieving  Hoss M.D.   On: 08/01/2015 11:23   Dg Hips Bilat With Pelvis 3-4 Views  07/30/2015   CLINICAL DATA:  Right hip pain and limited range of motion after falling when walking into a restaurant today.  EXAM: DG HIP (WITH OR WITHOUT PELVIS) 3-4V BILAT  COMPARISON:  None.  FINDINGS: Comminuted right intertrochanteric fracture with varus angulation and mild distraction of the major fragments. Mild left hip degenerative changes. Lower lumbar spine degenerative changes.  IMPRESSION: 1. Comminuted right intertrochanteric fracture with varus angulation. 2. Mild left hip and lower lumbar spine degenerative changes.   Electronically Signed   By: Claudie Revering M.D.   On: 07/30/2015 15:19    Micro Results      Recent Results (from the past 240 hour(s))  MRSA PCR Screening     Status: None   Collection Time: 07/31/15  4:54 AM  Result Value Ref Range Status   MRSA by PCR NEGATIVE NEGATIVE Final    Comment:        The GeneXpert MRSA Assay (FDA approved for NASAL specimens only), is one component of a comprehensive MRSA colonization surveillance program. It is not intended to diagnose MRSA infection nor to guide or monitor treatment for MRSA infections.   Surgical pcr screen     Status: None   Collection Time: 08/01/15  7:30 AM  Result Value Ref Range Status   MRSA, PCR NEGATIVE NEGATIVE Final   Staphylococcus aureus NEGATIVE NEGATIVE Final    Comment:        The Xpert SA Assay (FDA approved for NASAL specimens in patients over 67 years of age), is one component of a comprehensive surveillance program.  Test performance has been validated by [facility] for patients greater than or equal to 48 year old. It is not intended to diagnose infection nor to guide or monitor treatment.        Today   Subjective    Giannina Bartolome  today has no headache,no chest abdominal pain,no new weakness tingling or numbness, feels much better.    Objective   Blood pressure 117/45, pulse 68, temperature 98.3 F (36.8 C), temperature source Oral, resp. rate 18, height 5\' 4"  (1.626 m), weight 65.93 kg (145 lb 5.6 oz), SpO2 99 %.   Intake/Output Summary (Last 24 hours) at 08/03/15 1137 Last data filed at 08/03/15 1041  Gross per 24 hour  Intake    595 ml  Output    303 ml  Net    292 ml    Exam Awake Alert, Oriented x 3, No new F.N deficits, Normal affect [place].AT,PERRAL Supple Neck,No JVD, No cervical lymphadenopathy appriciated.  Symmetrical Chest wall movement, Good air movement bilaterally, CTAB RRR,No Gallops,Rubs or new Murmurs, No Parasternal Heave +ve B.Sounds, Abd Soft, Non tender, No organomegaly appriciated, No rebound -guarding or rigidity. No Cyanosis, Clubbing or edema, No new Rash or bruise, right hip postop scar appears clean   Data Review   CBC w Diff:  Lab Results  Component Value Date   WBC 9.9 08/03/2015   HGB 7.8* 08/03/2015   HCT 22.5* 08/03/2015   PLT 116* 08/03/2015   LYMPHOPCT 12 07/30/2015   MONOPCT 5 07/30/2015   EOSPCT 1 07/30/2015   BASOPCT 0 07/30/2015    CMP:  Lab Results  Component Value Date   NA 131* 08/03/2015   K 3.3* 08/03/2015   CL 94* 08/03/2015   CO2 30 08/03/2015   BUN 30* 08/03/2015   CREATININE 1.13* 08/03/2015   PROT  7.2 07/30/2015   ALBUMIN 4.3 07/30/2015   BILITOT 1.5* 07/30/2015   ALKPHOS 104 07/30/2015   AST 25 07/30/2015   ALT 21 07/30/2015  .   Total Time in preparing paper work, data evaluation and todays exam - 35 minutes  Thurnell Lose M.D on 08/03/2015 at 11:37 AM  [facility]   Office  [phone]

## 2015-08-04 ENCOUNTER — Encounter: Payer: Self-pay | Admitting: Nurse Practitioner

## 2015-08-04 ENCOUNTER — Non-Acute Institutional Stay (SKILLED_NURSING_FACILITY): Payer: Medicare Other | Admitting: Nurse Practitioner

## 2015-08-04 DIAGNOSIS — K219 Gastro-esophageal reflux disease without esophagitis: Secondary | ICD-10-CM

## 2015-08-04 DIAGNOSIS — I10 Essential (primary) hypertension: Secondary | ICD-10-CM | POA: Diagnosis not present

## 2015-08-04 DIAGNOSIS — I480 Paroxysmal atrial fibrillation: Secondary | ICD-10-CM | POA: Diagnosis not present

## 2015-08-04 DIAGNOSIS — E039 Hypothyroidism, unspecified: Secondary | ICD-10-CM | POA: Diagnosis not present

## 2015-08-04 DIAGNOSIS — D62 Acute posthemorrhagic anemia: Secondary | ICD-10-CM

## 2015-08-04 DIAGNOSIS — F3161 Bipolar disorder, current episode mixed, mild: Secondary | ICD-10-CM | POA: Diagnosis not present

## 2015-08-04 DIAGNOSIS — Z8781 Personal history of (healed) traumatic fracture: Secondary | ICD-10-CM | POA: Diagnosis not present

## 2015-08-04 DIAGNOSIS — E871 Hypo-osmolality and hyponatremia: Secondary | ICD-10-CM | POA: Diagnosis not present

## 2015-08-04 DIAGNOSIS — Z967 Presence of other bone and tendon implants: Secondary | ICD-10-CM | POA: Diagnosis not present

## 2015-08-04 DIAGNOSIS — K5900 Constipation, unspecified: Secondary | ICD-10-CM | POA: Diagnosis not present

## 2015-08-04 DIAGNOSIS — Z9889 Other specified postprocedural states: Secondary | ICD-10-CM

## 2015-08-04 DIAGNOSIS — E119 Type 2 diabetes mellitus without complications: Secondary | ICD-10-CM | POA: Diagnosis not present

## 2015-08-04 DIAGNOSIS — F319 Bipolar disorder, unspecified: Secondary | ICD-10-CM | POA: Insufficient documentation

## 2015-08-04 DIAGNOSIS — E785 Hyperlipidemia, unspecified: Secondary | ICD-10-CM | POA: Diagnosis not present

## 2015-08-04 LAB — TYPE AND SCREEN
ABO/RH(D): O NEG
ANTIBODY SCREEN: NEGATIVE
UNIT DIVISION: 0
UNIT DIVISION: 0

## 2015-08-04 NOTE — Assessment & Plan Note (Signed)
Take Synthroid, update TSH in setting of mild low Na and hx of DMII

## 2015-08-04 NOTE — Assessment & Plan Note (Signed)
Stable, continue Nexium.  °

## 2015-08-04 NOTE — Assessment & Plan Note (Signed)
Essential hypertension. Stable on combination of Cardizem, beta blocker along with diuretic and ACE inhibitor.

## 2015-08-04 NOTE — Progress Notes (Signed)
[name], female   DOB: July 17, 1922, 81 y.o.   MRN: [id number]  Location:  SNF [facility] Provider:  Marlana Latus NP  Code Status:  DNR Goals of care: Advanced Directive information    Chief Complaint  Patient presents with  . Medical Management of Chronic Issues  . Acute Visit  . Hospitalization Follow-up     HPI: Patient is a 81 y.o. female seen in the SNF at [facility] today for evaluation of  S/p R femur fx ORIF, past op anemia, Afib, HTN, bipolar disorder, GERD, and hypothyroidism.   The patient was hospitalized from 07/30/15 to 08/03/15 for the right femur fx, s/p ORIF, here for rehab, [place] to return [place] when [place] able. .  Review of Systems:  Review of Systems  Constitutional: Negative for fever, chills, weight loss, malaise/fatigue and diaphoresis.  HENT: Positive for hearing loss. Negative for congestion, ear discharge, ear pain, nosebleeds, sore throat and tinnitus.   Eyes: Negative for blurred vision, double vision, photophobia, pain, discharge and redness.  Respiratory: Negative for cough, hemoptysis, sputum production, shortness of breath, wheezing and stridor.   Cardiovascular: Positive for leg swelling. Negative for chest pain, palpitations, orthopnea and claudication.       Trace edema RLE  Gastrointestinal: Positive for constipation. Negative for heartburn, nausea, vomiting, abdominal pain, diarrhea and blood in stool.       Hx of GERD  Genitourinary: Negative for dysuria, urgency, frequency, hematuria and flank pain.  Musculoskeletal: Positive for joint pain. Negative for myalgias, back pain and neck pain.       R hip  Skin: Negative for itching and rash.       R+L knee surgical scars, right hip surgical incision intact, mild erythema peri wound, right fore arm to elbow bruise.   Neurological: Negative for dizziness, tingling, tremors, sensory change, speech change, focal weakness, seizures, loss of consciousness, weakness and headaches.    Endo/Heme/Allergies: Negative for environmental allergies and polydipsia. Does not bruise/bleed easily.  Psychiatric/Behavioral: Negative for depression, suicidal ideas, hallucinations, memory loss and substance abuse. The patient is nervous/anxious and has insomnia.     Past Medical History  Diagnosis Date  . Complication of anesthesia   . PONV (postoperative nausea and vomiting)   . Hypertension   . Hyperlipidemia   . Swelling of extremity     sees Dr. Gwenlyn Found, cardiologist 1 x year [phone]  . Bronchitis 12/2011  . Hypothyroidism     takes synthroid  . GERD (gastroesophageal reflux disease)   . Neuromuscular disorder     carpal tunnel in right hand  . Insomnia   . Hyperlipidemia   . Skin cancer of face 2000's    "one on each side of my face and one on my nose" (08/13/2013)  . Diabetes mellitus type 2, diet-controlled   . History of blood transfusion     "w/both knee OR's" (08/13/2013)  . H/O hiatal hernia   . Arthritis     "probably" (08/13/2013)  . Atrial fibrillation     new onset/pt report 08/13/2013  . Depression   . Anxiety attack     "take RX prn" (08/13/2013)  . Intertrochanteric fracture of right hip 07/30/2015    Patient Active Problem List   Diagnosis Date Noted  . Bipolar disorder 08/04/2015  . Hyponatremia 08/04/2015  . Constipation 08/04/2015  . Fracture, intertrochanteric, right femur   . S/P ORIF (open reduction internal fixation) fracture   . Coagulopathy 07/30/2015  . Intertrochanteric  fracture of right hip 07/30/2015  . Complication of anesthesia 07/30/2015  . Diabetes mellitus type 2, diet-controlled   . Skin cancer of face   . Paroxysmal atrial fibrillation 09/01/2014  . Right carotid bruit 11/18/2013  . Atrial fibrillation with rapid ventricular response 08/13/2013  . Chest pain 04/26/2013  . Postoperative anemia due to acute blood loss 03/03/2012  . PONV (postoperative nausea and vomiting)   . Hypertension   . Hyperlipidemia   . Swelling of  extremity   . Cataract   . Hypothyroidism   . GERD (gastroesophageal reflux disease)   . Arthritis   . Insomnia   . Bronchitis 12/13/2011    Allergies  Allergen Reactions  . Morphine And Related Other (See Comments)    Made pt "wild"    Medications: Patient's Medications  New Prescriptions   No medications on file  Previous Medications   ACETAMINOPHEN (TYLENOL) 325 MG TABLET    Take 650 mg by mouth every 6 (six) hours as needed for pain.   APIXABAN (ELIQUIS) 2.5 MG TABS TABLET    Take 1 tablet (2.5 mg total) by mouth 2 (two) times daily.   ASPIRIN EC 81 MG TABLET    Take 81 mg by mouth daily.   ATORVASTATIN (LIPITOR) 80 MG TABLET    Take 80 mg by mouth daily.   BENAZEPRIL (LOTENSIN) 40 MG TABLET    Take 1 tablet (40 mg total) by mouth every evening.   CHLORTHALIDONE (HYGROTON) 25 MG TABLET    TAKE 1 TABLET ONCE DAILY.   DILTIAZEM (CARDIZEM CD) 180 MG 24 HR CAPSULE    TAKE 1 CAPSULE DAILY.   ESOMEPRAZOLE (NEXIUM) 40 MG CAPSULE    Take 1 capsule (40 mg total) by mouth daily at 12 noon.   FERROUS SULFATE 325 (65 FE) MG TABLET    Take 1 tablet (325 mg total) by mouth 2 (two) times daily with a meal.   FLAXSEED, LINSEED, (FLAXSEED OIL) 1000 MG CAPS    1 pill po bid   FLUOXETINE (PROZAC) 20 MG CAPSULE    Take 1 capsule by mouth daily.   FUROSEMIDE (LASIX) 20 MG TABLET    Take 1 tablet (20 mg total) by mouth as needed for edema.   HYDROCODONE-ACETAMINOPHEN (NORCO) 5-325 MG PER TABLET    Take 1 tablet by mouth every 6 (six) hours as needed for moderate pain. MAXIMUM TOTAL ACETAMINOPHEN DOSE IS 4000 MG PER DAY   LEVOTHYROXINE (SYNTHROID, LEVOTHROID) 100 MCG TABLET    Take 100 mcg by mouth daily.    METOPROLOL (LOPRESSOR) 50 MG TABLET    Take 50 mg by mouth 2 (two) times daily.   MULTIPLE VITAMINS-MINERALS (MACULAR VITAMIN BENEFIT) TABS    1 pill by mouth twice a day. There is no dose for multivitamin.   POLYETHYL GLYCOL-PROPYL GLYCOL (SYSTANE) 0.4-0.3 % SOLN    Apply 1 drop to eye 3  (three) times daily as needed (dry eye).   POTASSIUM CHLORIDE (K-DUR) 10 MEQ TABLET    Take 1 tablet (10 mEq total) by mouth as needed.   PROBIOTIC CAPS    Take 1 capsule by mouth daily as needed (diarrhea).    SENNOSIDES-DOCUSATE SODIUM (SENOKOT-S) 8.6-50 MG TABLET    Take 2 tablets by mouth daily.   TEMAZEPAM (RESTORIL) 30 MG CAPSULE    Take 1 capsule (30 mg total) by mouth at bedtime as needed for sleep.  Modified Medications   No medications on file  Discontinued Medications   No medications  on file    Physical Exam: Filed Vitals:   08/04/15 1258  BP: 120/52  Pulse: 75  Temp: 98.6 F (37 C)  TempSrc: Tympanic  Resp: 18   There is no weight on file to calculate BMI.  Physical Exam  Constitutional: She is oriented to person, place, and time. She appears well-developed and well-nourished. No distress.  HENT:  Head: Normocephalic and atraumatic.  Right Ear: External ear normal.  Left Ear: External ear normal.  Nose: Nose normal.  Mouth/Throat: Oropharynx is clear and moist.  Eyes: Conjunctivae and EOM are normal. Pupils are equal, round, and reactive to light.  Neck: Normal range of motion. Neck supple. No JVD present. No tracheal deviation present. No thyromegaly present.  Cardiovascular: Normal rate, regular rhythm, normal heart sounds and intact distal pulses.   Pulmonary/Chest: Effort normal and breath sounds normal. No stridor. No respiratory distress. She has no wheezes. She has no rales. She exhibits no tenderness.  Abdominal: Bowel sounds are normal. She exhibits no distension and no mass. There is no tenderness. There is no rebound and no guarding. No hernia.  Genitourinary: Guaiac negative stool. No vaginal discharge found.  Musculoskeletal: She exhibits edema and tenderness.  RLE mild edema. R hip pain with ROM and weight bearing. Prn Norco available to her  Lymphadenopathy:    She has no cervical adenopathy.  Neurological: She is alert and oriented to person,  place, and time. She has normal reflexes. She displays normal reflexes. No cranial nerve deficit. She exhibits normal muscle tone. Coordination normal.  Skin: Skin is warm and dry. No rash noted. She is not diaphoretic. No erythema. No pallor.  Psychiatric: She has a normal mood and affect. Her behavior is normal. Judgment and thought content normal.  Insomnia, home med Temazepam nightly     Labs reviewed: Basic Metabolic Panel:  Recent Labs  07/31/15 0526 08/02/15 0418 08/03/15 0350  NA 134* 131* 131*  K 3.5 3.8 3.3*  CL 94* 92* 94*  CO2 33* 33* 30  GLUCOSE 137* 125* 117*  BUN 18 24* 30*  CREATININE 0.79 1.15* 1.13*  CALCIUM 9.6 8.9 8.2*    Liver Function Tests:  Recent Labs  07/30/15 1539  AST 25  ALT 21  ALKPHOS 104  BILITOT 1.5*  PROT 7.2  ALBUMIN 4.3    CBC:  Recent Labs  07/30/15 1539 07/31/15 0526 08/02/15 0418 08/03/15 0350  WBC 12.4* 11.9* 11.4* 9.9  NEUTROABS 10.2*  --   --   --   HGB 14.5 13.3 9.8* 7.8*  HCT 42.8 39.4 29.2* 22.5*  MCV 90.7 91.4 93.3 90.4  PLT 187 189 129* 116*    Lab Results  Component Value Date   TSH 0.803 08/03/2013   Lab Results  Component Value Date   HGBA1C 5.8* 04/26/2013   Lab Results  Component Value Date   CHOL 116 04/26/2013   HDL 45 04/26/2013   LDLCALC 60 04/26/2013   TRIG 56 04/26/2013   CHOLHDL 2.6 04/26/2013    Significant Diagnostic Results since last visit: none  Patient Care Team: Florina Ou, MD as PCP - General (Family Medicine)  Assessment/Plan Problem List Items Addressed This Visit    Hypertension    Essential hypertension. Stable on combination of Cardizem, beta blocker along with diuretic and ACE inhibitor.      Hyperlipidemia    Continue home dose statin.      Hypothyroidism    Take Synthroid, update TSH in setting of mild low Na and  hx of DMII      GERD (gastroesophageal reflux disease)    Stable, continue Nexium.       Postoperative anemia due to acute blood loss     Hgb 7.8. placed on oral iron. CBC and BMP in the next 3-4 days. Repeat anemia panel in a week.       Paroxysmal atrial fibrillation    Proximal atrial fibrillation, resume diltiazem, resume beta blocker. Eliquis was restarted       Diabetes mellitus type 2, diet-controlled    Diet control       S/P ORIF (open reduction internal fixation) fracture    Mechanical fall with right femoral intertrochanteric fracture. Status post open reduction internal fixation by Dr. [name] on 08/01/2015, seems to have tolerated the procedure well. Weightbearing as tolerated, Eliquis for DVT prophylaxis confirmed with Dr. [name] to be started now. PT      Bipolar disorder    Continue fluoxetine and temazepam.      Hyponatremia     Monitor blood pressure and BMP closely. Na 131 08/03/15.        Constipation - Primary    MiraLax daily.           Family/ staff Communication: here for rehab.   Labs/tests ordered:  TSH, CBC, BMP, anemia panel.   [facility] Kristena Wilhelmi NP Geriatrics [facility] [phone], [place] [address] On Call:  [phone] & follow prompts after 5pm & weekends Office Phone:  [phone] Office Fax:  [phone]

## 2015-08-04 NOTE — Assessment & Plan Note (Signed)
MiraLax daily.

## 2015-08-04 NOTE — Assessment & Plan Note (Signed)
Hgb 7.8. placed on oral iron. CBC and BMP in the next 3-4 days. Repeat anemia panel in a week.

## 2015-08-04 NOTE — Assessment & Plan Note (Addendum)
Proximal atrial fibrillation, resume diltiazem, resume beta blocker. Eliquis was restarted

## 2015-08-04 NOTE — Assessment & Plan Note (Signed)
Continue home dose statin.

## 2015-08-04 NOTE — Assessment & Plan Note (Signed)
Diet control

## 2015-08-04 NOTE — Assessment & Plan Note (Signed)
Monitor blood pressure and BMP closely. Na 131 08/03/15.

## 2015-08-04 NOTE — Assessment & Plan Note (Signed)
Mechanical fall with right femoral intertrochanteric fracture. Status post open reduction internal fixation by Dr. [name] on 08/01/2015, seems to have tolerated the procedure well. Weightbearing as tolerated, Eliquis for DVT prophylaxis confirmed with Dr. [name] to be started now. PT

## 2015-08-04 NOTE — Assessment & Plan Note (Signed)
Continue fluoxetine and temazepam.

## 2015-08-06 ENCOUNTER — Encounter: Payer: Self-pay | Admitting: Internal Medicine

## 2015-08-06 ENCOUNTER — Non-Acute Institutional Stay (SKILLED_NURSING_FACILITY): Payer: Medicare Other | Admitting: Internal Medicine

## 2015-08-06 DIAGNOSIS — E119 Type 2 diabetes mellitus without complications: Secondary | ICD-10-CM | POA: Diagnosis not present

## 2015-08-06 DIAGNOSIS — E785 Hyperlipidemia, unspecified: Secondary | ICD-10-CM | POA: Diagnosis not present

## 2015-08-06 DIAGNOSIS — D62 Acute posthemorrhagic anemia: Secondary | ICD-10-CM

## 2015-08-06 DIAGNOSIS — E039 Hypothyroidism, unspecified: Secondary | ICD-10-CM

## 2015-08-06 DIAGNOSIS — G4700 Insomnia, unspecified: Secondary | ICD-10-CM

## 2015-08-06 DIAGNOSIS — M1990 Unspecified osteoarthritis, unspecified site: Secondary | ICD-10-CM

## 2015-08-06 DIAGNOSIS — E871 Hypo-osmolality and hyponatremia: Secondary | ICD-10-CM

## 2015-08-06 DIAGNOSIS — I10 Essential (primary) hypertension: Secondary | ICD-10-CM | POA: Diagnosis not present

## 2015-08-06 DIAGNOSIS — Z8781 Personal history of (healed) traumatic fracture: Secondary | ICD-10-CM

## 2015-08-06 DIAGNOSIS — Z967 Presence of other bone and tendon implants: Secondary | ICD-10-CM

## 2015-08-06 DIAGNOSIS — I480 Paroxysmal atrial fibrillation: Secondary | ICD-10-CM | POA: Diagnosis not present

## 2015-08-06 DIAGNOSIS — Z9889 Other specified postprocedural states: Secondary | ICD-10-CM

## 2015-08-06 DIAGNOSIS — F3161 Bipolar disorder, current episode mixed, mild: Secondary | ICD-10-CM | POA: Diagnosis not present

## 2015-08-06 DIAGNOSIS — Z7901 Long term (current) use of anticoagulants: Secondary | ICD-10-CM

## 2015-08-06 NOTE — Progress Notes (Signed)
[name], female   DOB: 1922-12-09, 81 y.o.   MRN: [id number]    HISTORY AND PHYSICAL  Location:  [facility] Room Number: N 37 Place of Service: SNF (31)   Extended Emergency Contact Information Primary Emergency Contact: Manka,[name] Address: [address]          [place], [place] [address] [place] Phone: [phone] Relation: Son Secondary Emergency Contact: [place], [place] [address] [place] of [place] Phone: [phone] Relation: Son  Advanced Directive information Does patient have an advance directive?: Yes, Type of Advance Directive: Living will;Out of facility DNR (pink MOST or yellow form), Pre-existing out of facility DNR order (yellow form or pink MOST form): Yellow form placed in chart (order not valid for inpatient use), Does patient want to make changes to advanced directive?: No - Patient declined  Chief Complaint  Patient presents with  . New Admit To SNF    Following hospitalization    HPI:  New admission to skilled nursing facility [id number] following hospitalization from 07/30/2015 through 08/03/2015. Patient had a fall and fractured her right femur. Dr. Carter Kitten did open reduction internal fixation with screws. It is anticipated she'll be here for short-term rehabilitative care.  Patient has a history of paroxysmal atrial fibrillation. This occurred during her hospital stay. She had a resumption of diltiazem and beta blocker. Eliquis was started as well. Patient previously was taking aspirin and she did remain on this.  Other problems include hypertension, diabetes mellitus, GERD, hiatal hernia, hypothyroidism, constipation, chronic insomnia.  Patient had acute blood loss anemia with a fall in her hemoglobin to 9.9 after surgery. Remains on ferrous sulfate.  Past Medical History  Diagnosis Date  . Complication of anesthesia   . PONV (postoperative nausea  and vomiting)   . Hypertension   . Hyperlipidemia   . Swelling of extremity     sees Dr. Gwenlyn Found, cardiologist 1 x year [phone]  . Bronchitis 12/2011  . Hypothyroidism     takes synthroid  . GERD (gastroesophageal reflux disease)   . Neuromuscular disorder     carpal tunnel in right hand  . Insomnia   . Hyperlipidemia   . Skin cancer of face 2000's    "one on each side of my face and one on my nose" (08/13/2013)  . Diabetes mellitus type 2, diet-controlled   . History of blood transfusion     "w/both knee OR's" (08/13/2013)  . H/O hiatal hernia   . Arthritis     "probably" (08/13/2013)  . Atrial fibrillation     new onset/pt report 08/13/2013  . Depression   . Anxiety attack     "take RX prn" (08/13/2013)  . Intertrochanteric fracture of right hip 07/30/2015    Past Surgical History  Procedure Laterality Date  . Total knee arthroplasty Right 2005  . Carpal tunnel release Right ~ 2005  . Eye surgery    . Breast biopsy Left ?1970's  . Cardiovascular stress test  12/07/11  . Doppler echocardiography  12/07/11  . Total knee arthroplasty  03/02/2012    Procedure: TOTAL KNEE ARTHROPLASTY;  Surgeon: Yvette Rack., MD;  Location: [facility];  Service: Orthopedics;  Laterality: Left;  . Joint replacement      bilateral knee replacement  . Cataract extraction, bilateral Bilateral 2000  . Skin cancer excision  2000's    "off my nose" (  08/13/2013)  . Dilation and curettage of uterus  1950's  . Cardiovascular stress test  12/07/2011    LEXISCAN, normal scan, no significant wall abnormalities noted  . 2d echocardiogram  12/07/2004    EF 48%, moderate pulmonary hypertension  . Femur im nail Right 08/01/2015    Procedure: INTRAMEDULLARY (IM) NAIL FEMORAL;  Surgeon: Marchia Bond, MD;  Location: [facility];  Service: Orthopedics;  Laterality: Right;    Patient Care Team: Estill Dooms, MD as PCP - General (Internal Medicine) Earlie Server, MD as Consulting Physician (Orthopedic Surgery) Lorretta Harp, MD as Consulting Physician (Cardiology) Calvert Cantor, MD as Consulting Physician (Ophthalmology)  Social History   Social History  . Marital Status: Widowed    Spouse Name: N/A  . Number of Children: N/A  . Years of Education: N/A   Occupational History  . Not on file.   Social History Main Topics  . Smoking status: Never Smoker   . Smokeless tobacco: Never Used  . Alcohol Use: No  . Drug Use: No  . Sexual Activity: No   Other Topics Concern  . Not on file   Social History Narrative   Widowed. Lives at [facility]. Son died in the [place] War and she has not slept well since then.     reports that she has never smoked. She has never used smokeless tobacco. She reports that she does not drink alcohol or use illicit drugs.  Family History  Problem Relation Age of Onset  . Arrhythmia Mother   . Heart disease Mother   . Arrhythmia Sister   . Arrhythmia Brother   . Heart disease Brother   . Stroke Father 73  . Heart disease Brother   . Heart disease Brother   . Cancer Brother     Colon cancer  . Heart disease Sister   . Heart disease Sister   . Stroke Sister   . Diabetes Sister   . Heart disease Child 59  . Heart disease Child 21   Family Status  Relation Status Death Age  . Mother Deceased 59  . Sister Deceased 66  . Father Deceased 71  . Brother Deceased 45  . Brother Deceased 92  . Brother Deceased 44  . Child Deceased 62    Immunization History  Administered Date(s) Administered  . Influenza Whole 03/05/2014    Allergies  Allergen Reactions  . Morphine And Related Other (See Comments)    Made pt "wild"    Medications: Patient's Medications  New Prescriptions   No medications on file  Previous Medications   ACETAMINOPHEN (TYLENOL) 325 MG TABLET    Take 650 mg by mouth every 6 (six) hours as needed for pain.   APIXABAN (ELIQUIS) 2.5 MG TABS TABLET    Take 1 tablet (2.5 mg total) by mouth 2 (two) times daily.   ASPIRIN EC 81 MG  TABLET    Take 81 mg by mouth daily.   ATORVASTATIN (LIPITOR) 80 MG TABLET    Take 80 mg by mouth daily.   BENAZEPRIL (LOTENSIN) 40 MG TABLET    Take 1 tablet (40 mg total) by mouth every evening.   CHLORTHALIDONE (HYGROTON) 25 MG TABLET    TAKE 1 TABLET ONCE DAILY.   DILTIAZEM (CARDIZEM CD) 180 MG 24 HR CAPSULE    TAKE 1 CAPSULE DAILY.   ESOMEPRAZOLE (NEXIUM) 40 MG CAPSULE    Take 1 capsule (40 mg total) by mouth daily at 12 noon.   FERROUS  SULFATE 325 (65 FE) MG TABLET    Take 1 tablet (325 mg total) by mouth 2 (two) times daily with a meal.   FLAXSEED, LINSEED, (FLAXSEED OIL) 1000 MG CAPS    1 pill po bid   FLUOXETINE (PROZAC) 20 MG CAPSULE    Take 1 capsule by mouth daily.   FUROSEMIDE (LASIX) 20 MG TABLET    Take 1 tablet (20 mg total) by mouth as needed for edema.   HYDROCODONE-ACETAMINOPHEN (NORCO) 5-325 MG PER TABLET    Take 1 tablet by mouth every 6 (six) hours as needed for moderate pain. MAXIMUM TOTAL ACETAMINOPHEN DOSE IS 4000 MG PER DAY   LEVOTHYROXINE (SYNTHROID, LEVOTHROID) 100 MCG TABLET    Take 100 mcg by mouth daily.    METOPROLOL (LOPRESSOR) 50 MG TABLET    Take 50 mg by mouth 2 (two) times daily.   MULTIPLE VITAMINS-MINERALS (MACULAR VITAMIN BENEFIT) TABS    1 pill by mouth twice a day. There is no dose for multivitamin.   POLYETHYL GLYCOL-PROPYL GLYCOL (SYSTANE) 0.4-0.3 % SOLN    Apply 1 drop to eye 3 (three) times daily as needed (dry eye).   POTASSIUM CHLORIDE (K-DUR) 10 MEQ TABLET    Take 1 tablet (10 mEq total) by mouth as needed.   PROBIOTIC CAPS    Take 1 capsule by mouth daily as needed (diarrhea).    SENNOSIDES-DOCUSATE SODIUM (SENOKOT-S) 8.6-50 MG TABLET    Take 2 tablets by mouth daily.   TEMAZEPAM (RESTORIL) 30 MG CAPSULE    Take 1 capsule (30 mg total) by mouth at bedtime as needed for sleep.  Modified Medications   No medications on file  Discontinued Medications   No medications on file    Review of Systems  Constitutional: Negative for fever, chills and  diaphoresis.  HENT: Positive for hearing loss. Negative for congestion, ear discharge, ear pain, nosebleeds, sore throat and tinnitus.   Eyes: Negative for photophobia, pain, discharge and redness.  Respiratory: Negative for cough, shortness of breath, wheezing and stridor.   Cardiovascular: Positive for leg swelling. Negative for chest pain and palpitations.       History PAF with RVR. Trace edema RLE  Gastrointestinal: Positive for constipation. Negative for nausea, vomiting, abdominal pain, diarrhea and blood in stool.       Hx of GERD and hiatal hernia.  Endocrine: Negative for polydipsia.       History of diabetes mellitus and hypothyroidism  Genitourinary: Negative for dysuria, urgency, frequency, hematuria and flank pain.  Musculoskeletal: Negative for myalgias, back pain and neck pain.       Intertrochanteric fracture R femur 07/30/15. Subsequent open reduction internal fixation with screws by Dr. Carter Kitten.  Skin: Negative for rash.       R+L knee surgical scars, right hip surgical incision intact, mild erythema peri wound, right fore arm to elbow bruise and superficial laceration.   Allergic/Immunologic: Negative for environmental allergies.  Neurological: Negative for dizziness, tremors, seizures, weakness and headaches.  Hematological: Does not bruise/bleed easily.       Acute blood loss anemia following surgical repair of her right femur  Psychiatric/Behavioral: Positive for suicidal ideas and sleep disturbance (Chronic insomnia). Negative for hallucinations. The patient is nervous/anxious.     Filed Vitals:   08/06/15 1723  BP: 124/60  Pulse: 72  Temp: 98 F (36.7 C)  Resp: 18  Height: '5\' 3"'  (1.6 m)  Weight: 138 lb (62.596 kg)   Body mass index is 24.45 kg/(m^2).  Physical Exam  Constitutional: She is oriented to person, place, and time. She appears well-developed and well-nourished. No distress.  HENT:  Head: Normocephalic and atraumatic.  Right Ear: External ear  normal.  Left Ear: External ear normal.  Nose: Nose normal.  Mouth/Throat: Oropharynx is clear and moist.  Eyes: Conjunctivae and EOM are normal. Pupils are equal, round, and reactive to light.  Corrective lenses  Neck: Normal range of motion. Neck supple. No JVD present. No tracheal deviation present. No thyromegaly present.  Cardiovascular: Normal rate, regular rhythm, normal heart sounds and intact distal pulses.   Pulmonary/Chest: Effort normal and breath sounds normal. No stridor. No respiratory distress. She has no wheezes. She has no rales. She exhibits no tenderness.  Abdominal: Bowel sounds are normal. She exhibits no distension and no mass. There is no tenderness. There is no rebound and no guarding. No hernia.  Genitourinary: Guaiac negative stool. No vaginal discharge found.  Musculoskeletal: She exhibits edema and tenderness.  RLE mild edema. R hip pain with ROM and weight bearing.   Lymphadenopathy:    She has no cervical adenopathy.  Neurological: She is alert and oriented to person, place, and time. She has normal reflexes. She displays normal reflexes. No cranial nerve deficit. She exhibits normal muscle tone. Coordination normal.  Skin: Skin is warm and dry. No rash noted. She is not diaphoretic. No erythema. No pallor.  Psychiatric: She has a normal mood and affect. Her behavior is normal. Judgment and thought content normal.  Insomnia, home med Temazepam nightly      Labs reviewed: Admission on 07/30/2015, Discharged on 08/03/2015  Component Date Value Ref Range Status  . Sodium 07/30/2015 136  135 - 145 mmol/L Final  . Potassium 07/30/2015 3.5  3.5 - 5.1 mmol/L Final  . Chloride 07/30/2015 96* 101 - 111 mmol/L Final  . CO2 07/30/2015 30  22 - 32 mmol/L Final  . Glucose, Bld 07/30/2015 138* 65 - 99 mg/dL Final  . BUN 07/30/2015 20  6 - 20 mg/dL Final  . Creatinine, Ser 07/30/2015 0.83  0.44 - 1.00 mg/dL Final  . Calcium 07/30/2015 10.0  8.9 - 10.3 mg/dL Final  .  Total Protein 07/30/2015 7.2  6.5 - 8.1 g/dL Final  . Albumin 07/30/2015 4.3  3.5 - 5.0 g/dL Final  . AST 07/30/2015 25  15 - 41 U/L Final  . ALT 07/30/2015 21  14 - 54 U/L Final  . Alkaline Phosphatase 07/30/2015 104  38 - 126 U/L Final  . Total Bilirubin 07/30/2015 1.5* 0.3 - 1.2 mg/dL Final  . GFR calc non Af Amer 07/30/2015 >60  >60 mL/min Final  . GFR calc Af Amer 07/30/2015 >60  >60 mL/min Final   Comment: (NOTE) The eGFR has been calculated using the CKD EPI equation. This calculation has not been validated in all clinical situations. eGFR's persistently <60 mL/min signify possible Chronic Kidney Disease.   . Anion gap 07/30/2015 10  5 - 15 Final  . WBC 07/30/2015 12.4* 4.0 - 10.5 K/uL Final  . RBC 07/30/2015 4.72  3.87 - 5.11 MIL/uL Final  . Hemoglobin 07/30/2015 14.5  12.0 - 15.0 g/dL Final  . HCT 07/30/2015 42.8  36.0 - 46.0 % Final  . MCV 07/30/2015 90.7  78.0 - 100.0 fL Final  . MCH 07/30/2015 30.7  26.0 - 34.0 pg Final  . MCHC 07/30/2015 33.9  30.0 - 36.0 g/dL Final  . RDW 07/30/2015 12.8  11.5 - 15.5 % Final  . Platelets  07/30/2015 187  150 - 400 K/uL Final  . Neutrophils Relative % 07/30/2015 82* 43 - 77 % Final  . Neutro Abs 07/30/2015 10.2* 1.7 - 7.7 K/uL Final  . Lymphocytes Relative 07/30/2015 12  12 - 46 % Final  . Lymphs Abs 07/30/2015 1.5  0.7 - 4.0 K/uL Final  . Monocytes Relative 07/30/2015 5  3 - 12 % Final  . Monocytes Absolute 07/30/2015 0.7  0.1 - 1.0 K/uL Final  . Eosinophils Relative 07/30/2015 1  0 - 5 % Final  . Eosinophils Absolute 07/30/2015 0.1  0.0 - 0.7 K/uL Final  . Basophils Relative 07/30/2015 0  0 - 1 % Final  . Basophils Absolute 07/30/2015 0.0  0.0 - 0.1 K/uL Final  . Color, Urine 07/30/2015 YELLOW  YELLOW Final  . APPearance 07/30/2015 CLEAR  CLEAR Final  . Specific Gravity, Urine 07/30/2015 1.018  1.005 - 1.030 Final  . pH 07/30/2015 7.5  5.0 - 8.0 Final  . Glucose, UA 07/30/2015 NEGATIVE  NEGATIVE mg/dL Final  . Hgb urine dipstick  07/30/2015 NEGATIVE  NEGATIVE Final  . Bilirubin Urine 07/30/2015 NEGATIVE  NEGATIVE Final  . Ketones, ur 07/30/2015 NEGATIVE  NEGATIVE mg/dL Final  . Protein, ur 07/30/2015 NEGATIVE  NEGATIVE mg/dL Final  . Urobilinogen, UA 07/30/2015 1.0  0.0 - 1.0 mg/dL Final  . Nitrite 07/30/2015 NEGATIVE  NEGATIVE Final  . Leukocytes, UA 07/30/2015 NEGATIVE  NEGATIVE Final   MICROSCOPIC NOT DONE ON URINES WITH NEGATIVE PROTEIN, BLOOD, LEUKOCYTES, NITRITE, OR GLUCOSE <1000 mg/dL.  Marland Kitchen Prothrombin Time 07/30/2015 14.6  11.6 - 15.2 seconds Final  . INR 07/30/2015 1.12  0.00 - 1.49 Final  . WBC 07/31/2015 11.9* 4.0 - 10.5 K/uL Final  . RBC 07/31/2015 4.31  3.87 - 5.11 MIL/uL Final  . Hemoglobin 07/31/2015 13.3  12.0 - 15.0 g/dL Final  . HCT 07/31/2015 39.4  36.0 - 46.0 % Final  . MCV 07/31/2015 91.4  78.0 - 100.0 fL Final  . MCH 07/31/2015 30.9  26.0 - 34.0 pg Final  . MCHC 07/31/2015 33.8  30.0 - 36.0 g/dL Final  . RDW 07/31/2015 13.1  11.5 - 15.5 % Final  . Platelets 07/31/2015 189  150 - 400 K/uL Final  . Sodium 07/31/2015 134* 135 - 145 mmol/L Final  . Potassium 07/31/2015 3.5  3.5 - 5.1 mmol/L Final  . Chloride 07/31/2015 94* 101 - 111 mmol/L Final  . CO2 07/31/2015 33* 22 - 32 mmol/L Final  . Glucose, Bld 07/31/2015 137* 65 - 99 mg/dL Final  . BUN 07/31/2015 18  6 - 20 mg/dL Final  . Creatinine, Ser 07/31/2015 0.79  0.44 - 1.00 mg/dL Final  . Calcium 07/31/2015 9.6  8.9 - 10.3 mg/dL Final  . GFR calc non Af Amer 07/31/2015 >60  >60 mL/min Final  . GFR calc Af Amer 07/31/2015 >60  >60 mL/min Final   Comment: (NOTE) The eGFR has been calculated using the CKD EPI equation. This calculation has not been validated in all clinical situations. eGFR's persistently <60 mL/min signify possible Chronic Kidney Disease.   . Anion gap 07/31/2015 7  5 - 15 Final  . MRSA by PCR 07/31/2015 NEGATIVE  NEGATIVE Final   Comment:        The GeneXpert MRSA Assay (FDA approved for NASAL specimens only), is one  component of a comprehensive MRSA colonization surveillance program. It is not intended to diagnose MRSA infection nor to guide or monitor treatment for MRSA infections.   Edmonia Lynch  07/31/2015 138* 65 - 99 mg/dL Final  . MRSA, PCR 08/01/2015 NEGATIVE  NEGATIVE Final  . Staphylococcus aureus 08/01/2015 NEGATIVE  NEGATIVE Final   Comment:        The Xpert SA Assay (FDA approved for NASAL specimens in patients over 41 years of age), is one component of a comprehensive surveillance program.  Test performance has been validated by [facility] for patients greater than or equal to 66 year old. It is not intended to diagnose infection nor to guide or monitor treatment.   . Glucose-Capillary 08/01/2015 120* 65 - 99 mg/dL Final  . Glucose-Capillary 08/01/2015 155* 65 - 99 mg/dL Final  . WBC 08/02/2015 11.4* 4.0 - 10.5 K/uL Final  . RBC 08/02/2015 3.13* 3.87 - 5.11 MIL/uL Final  . Hemoglobin 08/02/2015 9.8* 12.0 - 15.0 g/dL Final  . HCT 08/02/2015 29.2* 36.0 - 46.0 % Final  . MCV 08/02/2015 93.3  78.0 - 100.0 fL Final  . MCH 08/02/2015 31.3  26.0 - 34.0 pg Final  . MCHC 08/02/2015 33.6  30.0 - 36.0 g/dL Final  . RDW 08/02/2015 13.6  11.5 - 15.5 % Final  . Platelets 08/02/2015 129* 150 - 400 K/uL Final  . Sodium 08/02/2015 131* 135 - 145 mmol/L Final  . Potassium 08/02/2015 3.8  3.5 - 5.1 mmol/L Final  . Chloride 08/02/2015 92* 101 - 111 mmol/L Final  . CO2 08/02/2015 33* 22 - 32 mmol/L Final  . Glucose, Bld 08/02/2015 125* 65 - 99 mg/dL Final  . BUN 08/02/2015 24* 6 - 20 mg/dL Final  . Creatinine, Ser 08/02/2015 1.15* 0.44 - 1.00 mg/dL Final  . Calcium 08/02/2015 8.9  8.9 - 10.3 mg/dL Final  . GFR calc non Af Amer 08/02/2015 41* >60 mL/min Final  . GFR calc Af Amer 08/02/2015 47* >60 mL/min Final   Comment: (NOTE) The eGFR has been calculated using the CKD EPI equation. This calculation has not been validated in all clinical situations. eGFR's persistently <60 mL/min  signify possible Chronic Kidney Disease.   . Anion gap 08/02/2015 6  5 - 15 Final  . Osmolality 08/02/2015 281  275 - 300 mOsm/kg Final   Performed at [facility]  . Sodium, Ur 08/02/2015 <10   Final  . Uric Acid, Serum 08/02/2015 5.1  2.3 - 6.6 mg/dL Final  . Specimen Description 08/02/2015 URINE, CATHETERIZED   Final  . Special Requests 08/02/2015 NONE   Final  . Culture 08/02/2015 NO GROWTH 1 DAY   Final  . Report Status 08/02/2015 08/03/2015 FINAL   Final  . Color, Urine 08/02/2015 YELLOW  YELLOW Final  . APPearance 08/02/2015 CLEAR  CLEAR Final  . Specific Gravity, Urine 08/02/2015 1.019  1.005 - 1.030 Final  . pH 08/02/2015 5.0  5.0 - 8.0 Final  . Glucose, UA 08/02/2015 NEGATIVE  NEGATIVE mg/dL Final  . Hgb urine dipstick 08/02/2015 NEGATIVE  NEGATIVE Final  . Bilirubin Urine 08/02/2015 NEGATIVE  NEGATIVE Final  . Ketones, ur 08/02/2015 NEGATIVE  NEGATIVE mg/dL Final  . Protein, ur 08/02/2015 NEGATIVE  NEGATIVE mg/dL Final  . Urobilinogen, UA 08/02/2015 0.2  0.0 - 1.0 mg/dL Final  . Nitrite 08/02/2015 NEGATIVE  NEGATIVE Final  . Leukocytes, UA 08/02/2015 NEGATIVE  NEGATIVE Final   MICROSCOPIC NOT DONE ON URINES WITH NEGATIVE PROTEIN, BLOOD, LEUKOCYTES, NITRITE, OR GLUCOSE <1000 mg/dL.  . Osmolality, Ur 08/02/2015 501  390 - 1090 mOsm/kg Final   Performed at [facility]  . Creatinine, Urine 08/02/2015 163.62  Final  . WBC 08/03/2015 9.9  4.0 - 10.5 K/uL Final  . RBC 08/03/2015 2.49* 3.87 - 5.11 MIL/uL Final  . Hemoglobin 08/03/2015 7.8* 12.0 - 15.0 g/dL Final   Comment: REPEATED TO VERIFY DELTA CHECK NOTED RESULT CALLED TO, READ BACK BY AND VERIFIED WITH: Cathlean Marseilles AT 5188 08/03/15. K.[email or web address]   . HCT 08/03/2015 22.5* 36.0 - 46.0 % Final  . MCV 08/03/2015 90.4  78.0 - 100.0 fL Final  . MCH 08/03/2015 31.3  26.0 - 34.0 pg Final  . MCHC 08/03/2015 34.7  30.0 - 36.0 g/dL Final  . RDW 08/03/2015 13.4  11.5 - 15.5 % Final  . Platelets 08/03/2015 116* 150 -  400 K/uL Final   CONSISTENT WITH PREVIOUS RESULT  . Sodium 08/03/2015 131* 135 - 145 mmol/L Final  . Potassium 08/03/2015 3.3* 3.5 - 5.1 mmol/L Final  . Chloride 08/03/2015 94* 101 - 111 mmol/L Final  . CO2 08/03/2015 30  22 - 32 mmol/L Final  . Glucose, Bld 08/03/2015 117* 65 - 99 mg/dL Final  . BUN 08/03/2015 30* 6 - 20 mg/dL Final  . Creatinine, Ser 08/03/2015 1.13* 0.44 - 1.00 mg/dL Final  . Calcium 08/03/2015 8.2* 8.9 - 10.3 mg/dL Final  . GFR calc non Af Amer 08/03/2015 41* >60 mL/min Final  . GFR calc Af Amer 08/03/2015 48* >60 mL/min Final   Comment: (NOTE) The eGFR has been calculated using the CKD EPI equation. This calculation has not been validated in all clinical situations. eGFR's persistently <60 mL/min signify possible Chronic Kidney Disease.   . Anion gap 08/03/2015 7  5 - 15 Final  . ABO/RH(D) 08/03/2015 O NEG   Final  . Antibody Screen 08/03/2015 NEG   Final  . Sample Expiration 08/03/2015 08/06/2015   Final  . Unit Number 08/03/2015 [id number]   Final  . Blood Component Type 08/03/2015 RED CELLS,LR   Final  . Unit division 08/03/2015 00   Final  . Status of Unit 08/03/2015 REL FROM [facility]   Final  . Transfusion Status 08/03/2015 OK TO TRANSFUSE   Final  . Crossmatch Result 08/03/2015 Compatible   Final  . Unit Number 08/03/2015 [id number]   Final  . Blood Component Type 08/03/2015 RED CELLS,LR   Final  . Unit division 08/03/2015 00   Final  . Status of Unit 08/03/2015 REL FROM [facility]   Final  . Transfusion Status 08/03/2015 OK TO TRANSFUSE   Final  . Crossmatch Result 08/03/2015 Compatible   Final  . Order Confirmation 08/03/2015 ORDER PROCESSED BY BLOOD BANK   Final  . Vitamin B-12 08/03/2015 512  180 - 914 pg/mL Final   Comment: (NOTE) This assay is not validated for testing neonatal or myeloproliferative syndrome specimens for Vitamin B12 levels.   . Folate 08/03/2015 20.5  >5.9 ng/mL Final  . Iron 08/03/2015 16* 28 - 170 ug/dL Final  . TIBC  08/03/2015 238* 250 - 450 ug/dL Final  . Saturation Ratios 08/03/2015 7* 10.4 - 31.8 % Final  . UIBC 08/03/2015 222   Final  . Ferritin 08/03/2015 151  11 - 307 ng/mL Final  . Retic Ct Pct 08/03/2015 2.2  0.4 - 3.1 % Final  . RBC. 08/03/2015 2.62* 3.87 - 5.11 MIL/uL Final  . Retic Count, Manual 08/03/2015 57.6  19.0 - 186.0 K/uL Final    Dg Chest 1 View  07/30/2015   CLINICAL DATA:  Status post fall today.  Hip pain.  EXAM: CHEST  1 VIEW  COMPARISON:  PA and lateral chest 08/03/2013.  CT chest 04/27/2013.  FINDINGS: The lungs are clear. Heart size is normal. No pneumothorax or pleural effusion. No focal bony abnormality is seen. Aortic atherosclerosis is noted.  IMPRESSION: No acute disease.   Electronically Signed   By: Inge Rise M.D.   On: 07/30/2015 15:22   Dg Forearm Right  07/30/2015   CLINICAL DATA:  Patient fell earlier today landing on right arm.  EXAM: RIGHT FOREARM - 2 VIEW  COMPARISON:  None.  FINDINGS: Frontal and lateral views were obtained. There are areas of soft tissue prominence dorsally, most likely representing soft tissue hematomas. No acute fracture or dislocation is apparent. There is extensive calcification in the radiocarpal joint. There is extensive osteoarthritic change in the scaphotrapezial and first carpal -metacarpal joints. There are foci of arterial vascular calcification.  IMPRESSION: No acute fracture or dislocation. Soft tissue hematomas dorsally. 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 Foci of arterial vascular calcification, primarily in the ulnar artery.   Electronically Signed   By: Lowella Grip III M.D.   On: 07/30/2015 15:20   Pelvis Portable  08/01/2015   CLINICAL DATA:  Status post ORIF  EXAM: PORTABLE PELVIS 1-2 VIEWS  COMPARISON:  None.  FINDINGS: Right medullary rod with proximal fixation screw is noted. The fracture fragments are in near anatomic alignment. No other focal abnormality is seen.  IMPRESSION: Status post ORIF of  proximal right femoral fracture.   Electronically Signed   By: Inez Catalina M.D.   On: 08/01/2015 11:32   Dg Knee Complete 4 Views Right  07/30/2015   CLINICAL DATA:  Fall today. Right hip pain. Acute intertrochanteric fracture of the right hip.  EXAM: RIGHT KNEE - COMPLETE 4+ VIEW  COMPARISON:  09/29/2004  FINDINGS: Right total knee prosthesis observed without visible fracture in the vicinity of the knee. No hardware complication identified.  IMPRESSION: 1. Total knee prosthesis, without complicating feature observed.   Electronically Signed   By: Van Clines M.D.   On: 07/30/2015 15:21   Dg Hip Operative Unilat With Pelvis Right  08/01/2015   CLINICAL DATA:  Right proximal femoral fracture  EXAM: OPERATIVE right HIP (WITH PELVIS IF PERFORMED) 4 VIEWS  TECHNIQUE: Fluoroscopic spot image(s) were submitted for interpretation post-operatively.  COMPARISON:  None.  FINDINGS: Spot films were obtained reveal a medullary rod with fixation screw in the proximal femur. Fracture fragments are in near anatomic alignment.  IMPRESSION: Status post ORIF of proximal right femoral fracture.   Electronically Signed   By: Inez Catalina M.D.   On: 08/01/2015 10:27   Dg Femur Port, 1v Right  08/01/2015   CLINICAL DATA:  Intra medullary nail  EXAM: RIGHT FEMUR PORTABLE 1 VIEW  COMPARISON:  07/30/2015  FINDINGS: An intra medullary rod is now in place across and intertrochanteric femur fracture. The fracture is not clearly visualized on this study. Total knee arthroplasty is also noted.  IMPRESSION: ORIF right intertrochanteric femur fracture.   Electronically Signed   By: Marybelle Killings M.D.   On: 08/01/2015 11:23   Dg Hips Bilat With Pelvis 3-4 Views  07/30/2015   CLINICAL DATA:  Right hip pain and limited range of motion after falling when walking into a restaurant today.  EXAM: DG HIP (WITH OR WITHOUT PELVIS) 3-4V BILAT  COMPARISON:  None.  FINDINGS: Comminuted right intertrochanteric fracture with varus angulation  and mild distraction of the major fragments. Mild left hip degenerative changes. Lower lumbar spine degenerative changes.  IMPRESSION:  1. Comminuted right intertrochanteric fracture with varus angulation. 2. Mild left hip and lower lumbar spine degenerative changes.   Electronically Signed   By: Claudie Revering M.D.   On: 07/30/2015 15:19     Assessment/Plan  1. S/P ORIF (open reduction internal fixation) fracture Patient is engaged in physical therapy and occupational therapy. She is cheerful and progressing well. Pain control is good.  2. Diabetes mellitus type 2, diet-controlled Continue dietary control. -A1c, future  3. Postoperative anemia due to acute blood loss Patient had evaluation of her anemia in the hospital which included B12 levels, folate, and iron studies. All were normal except for low serum iron. -CBC  4. Essential hypertension Controlled Continue Lotensin 40 mg daily, metoprolol 50 mg twice daily, diltiazem 180 mg daily, and chlorthalidone 25 mg daily  5. Hyperlipidemia Routine lab follow-up in the future  6. Hyponatremia -CMP next week  7. Hypothyroidism, unspecified hypothyroidism type -TSH future  8. Insomnia Continue Restoril  9. Chronic anticoagulation Currently on Eliquis and aspirin. I think because of her GI problems past the aspirin should be discontinued.  10. Bipolar disorder, current episode mixed, mild Continue fluoxetine  11. Paroxysmal atrial fibrillation Continue metoprolol 50 mg twice daily and diltiazem 180 mg daily  12. Arthritis Watchful observance for possible needs for increase in pain medication

## 2015-08-07 ENCOUNTER — Encounter: Payer: Self-pay | Admitting: Nurse Practitioner

## 2015-08-07 ENCOUNTER — Non-Acute Institutional Stay (SKILLED_NURSING_FACILITY): Payer: Medicare Other | Admitting: Nurse Practitioner

## 2015-08-07 DIAGNOSIS — E039 Hypothyroidism, unspecified: Secondary | ICD-10-CM

## 2015-08-07 DIAGNOSIS — E871 Hypo-osmolality and hyponatremia: Secondary | ICD-10-CM

## 2015-08-07 DIAGNOSIS — I10 Essential (primary) hypertension: Secondary | ICD-10-CM

## 2015-08-07 DIAGNOSIS — D62 Acute posthemorrhagic anemia: Secondary | ICD-10-CM

## 2015-08-07 DIAGNOSIS — I480 Paroxysmal atrial fibrillation: Secondary | ICD-10-CM | POA: Diagnosis not present

## 2015-08-07 NOTE — Assessment & Plan Note (Signed)
Heart rate is in control, taking Eliquis

## 2015-08-07 NOTE — Progress Notes (Signed)
[name], female   DOB: 02-04-1922, 81 y.o.   MRN: [id number]  Location:  [facility] Provider:  Marlana Latus NP  Code Status:  DNR Goals of care: Advanced Directive information    Chief Complaint  Patient presents with  . Medical Management of Chronic Issues  . Acute Visit    IDA, elevated TSH     HPI: Patient is a 81 y.o. female seen in the SNF at [facility] today for evaluation of  Elevated TSH, her home Levothyroxine 137mcg, TSH 08/06/15 showed 8.396, will need increase Levothyroxine and f/u TSH to supplement adequately. Na has been normalized, 136 08/06/15. Post Op anemia, Iron 30, Hgb 7.9 08/06/15, she has been taking Iron since the surgery, no apparent bleeding noted, need close monitoring.   Review of Systems:  Review of Systems  Constitutional: Negative for fever, chills and diaphoresis.  HENT: Positive for hearing loss. Negative for congestion, ear discharge, ear pain, nosebleeds, sore throat and tinnitus.   Eyes: Negative for photophobia, pain, discharge and redness.  Respiratory: Negative for cough, shortness of breath, wheezing and stridor.   Cardiovascular: Positive for leg swelling. Negative for chest pain and palpitations.       History PAF with RVR. Trace edema RLE  Gastrointestinal: Positive for constipation. Negative for nausea, vomiting, abdominal pain, diarrhea and blood in stool.       Hx of GERD and hiatal hernia.  Genitourinary: Negative for dysuria, urgency, frequency, hematuria and flank pain.  Musculoskeletal: Negative for myalgias, back pain and neck pain.       Intertrochanteric fracture R femur 07/30/15. Subsequent open reduction internal fixation with screws by Dr. Carter Kitten.  Skin: Negative for rash.       R+L knee surgical scars, right hip surgical incision intact, mild erythema peri wound, right fore arm to elbow bruise and superficial laceration.   Neurological: Negative for dizziness, tremors, seizures, weakness and headaches.    Endo/Heme/Allergies: Negative for environmental allergies and polydipsia. Does not bruise/bleed easily.       Acute blood loss anemia following surgical repair of her right femur  Psychiatric/Behavioral: Negative for suicidal ideas and hallucinations. The patient is nervous/anxious.     Past Medical History  Diagnosis Date  . Complication of anesthesia   . PONV (postoperative nausea and vomiting)   . Hypertension   . Hyperlipidemia   . Swelling of extremity     sees Dr. Gwenlyn Found, cardiologist 1 x year [phone]  . Bronchitis 12/2011  . Hypothyroidism     takes synthroid  . GERD (gastroesophageal reflux disease)   . Neuromuscular disorder     carpal tunnel in right hand  . Insomnia   . Hyperlipidemia   . Skin cancer of face 2000's    "one on each side of my face and one on my nose" (08/13/2013)  . Diabetes mellitus type 2, diet-controlled   . History of blood transfusion     "w/both knee OR's" (08/13/2013)  . H/O hiatal hernia   . Arthritis     "probably" (08/13/2013)  . Atrial fibrillation     new onset/pt report 08/13/2013  . Depression   . Anxiety attack     "take RX prn" (08/13/2013)  . Intertrochanteric fracture of right hip 07/30/2015    Patient Active Problem List   Diagnosis Date Noted  . Chronic anticoagulation 08/06/2015  . Bipolar disorder 08/04/2015  . Hyponatremia 08/04/2015  . Constipation 08/04/2015  . Fracture, intertrochanteric, right femur   .  S/P ORIF (open reduction internal fixation) fracture   . Complication of anesthesia 07/30/2015  . Diabetes mellitus type 2, diet-controlled   . Skin cancer of face   . Paroxysmal atrial fibrillation 09/01/2014  . Right carotid bruit 11/18/2013  . Postoperative anemia due to acute blood loss 03/03/2012  . Hypertension   . Hyperlipidemia   . Swelling of extremity   . Cataract   . Hypothyroidism   . GERD (gastroesophageal reflux disease)   . Arthritis   . Insomnia     Allergies  Allergen Reactions  . Morphine  And Related Other (See Comments)    Made pt "wild"    Medications: Patient's Medications  New Prescriptions   No medications on file  Previous Medications   ACETAMINOPHEN (TYLENOL) 325 MG TABLET    Take 650 mg by mouth every 6 (six) hours as needed for pain.   APIXABAN (ELIQUIS) 2.5 MG TABS TABLET    Take 1 tablet (2.5 mg total) by mouth 2 (two) times daily.   ASPIRIN EC 81 MG TABLET    Take 81 mg by mouth daily.   ATORVASTATIN (LIPITOR) 80 MG TABLET    Take 80 mg by mouth daily.   BENAZEPRIL (LOTENSIN) 40 MG TABLET    Take 1 tablet (40 mg total) by mouth every evening.   CHLORTHALIDONE (HYGROTON) 25 MG TABLET    TAKE 1 TABLET ONCE DAILY.   DILTIAZEM (CARDIZEM CD) 180 MG 24 HR CAPSULE    TAKE 1 CAPSULE DAILY.   ESOMEPRAZOLE (NEXIUM) 40 MG CAPSULE    Take 1 capsule (40 mg total) by mouth daily at 12 noon.   FERROUS SULFATE 325 (65 FE) MG TABLET    Take 1 tablet (325 mg total) by mouth 2 (two) times daily with a meal.   FLAXSEED, LINSEED, (FLAXSEED OIL) 1000 MG CAPS    1 pill po bid   FLUOXETINE (PROZAC) 20 MG CAPSULE    Take 1 capsule by mouth daily.   FUROSEMIDE (LASIX) 20 MG TABLET    Take 1 tablet (20 mg total) by mouth as needed for edema.   HYDROCODONE-ACETAMINOPHEN (NORCO) 5-325 MG PER TABLET    Take 1 tablet by mouth every 6 (six) hours as needed for moderate pain. MAXIMUM TOTAL ACETAMINOPHEN DOSE IS 4000 MG PER DAY   LEVOTHYROXINE (SYNTHROID, LEVOTHROID) 100 MCG TABLET    Take 100 mcg by mouth daily.    METOPROLOL (LOPRESSOR) 50 MG TABLET    Take 50 mg by mouth 2 (two) times daily.   MULTIPLE VITAMINS-MINERALS (MACULAR VITAMIN BENEFIT) TABS    1 pill by mouth twice a day. There is no dose for multivitamin.   POLYETHYL GLYCOL-PROPYL GLYCOL (SYSTANE) 0.4-0.3 % SOLN    Apply 1 drop to eye 3 (three) times daily as needed (dry eye).   POTASSIUM CHLORIDE (K-DUR) 10 MEQ TABLET    Take 1 tablet (10 mEq total) by mouth as needed.   PROBIOTIC CAPS    Take 1 capsule by mouth daily as needed  (diarrhea).    SENNOSIDES-DOCUSATE SODIUM (SENOKOT-S) 8.6-50 MG TABLET    Take 2 tablets by mouth daily.   TEMAZEPAM (RESTORIL) 30 MG CAPSULE    Take 1 capsule (30 mg total) by mouth at bedtime as needed for sleep.  Modified Medications   No medications on file  Discontinued Medications   No medications on file    Physical Exam: Filed Vitals:   08/07/15 0942  BP: 118/58  Pulse: 65  Temp: 98.9 F (  37.2 C)  TempSrc: Tympanic  Resp: 20   There is no weight on file to calculate BMI.  Physical Exam  Constitutional: She is oriented to person, place, and time. She appears well-developed and well-nourished. No distress.  HENT:  Head: Normocephalic and atraumatic.  Right Ear: External ear normal.  Left Ear: External ear normal.  Nose: Nose normal.  Mouth/Throat: Oropharynx is clear and moist.  Eyes: Conjunctivae and EOM are normal. Pupils are equal, round, and reactive to light.  Corrective lenses  Neck: Normal range of motion. Neck supple. No JVD present. No tracheal deviation present. No thyromegaly present.  Cardiovascular: Normal rate, regular rhythm, normal heart sounds and intact distal pulses.   Pulmonary/Chest: Effort normal and breath sounds normal. No stridor. No respiratory distress. She has no wheezes. She has no rales. She exhibits no tenderness.  Abdominal: Bowel sounds are normal. She exhibits no distension and no mass. There is no tenderness. There is no rebound and no guarding. No hernia.  Genitourinary: Guaiac negative stool. No vaginal discharge found.  Musculoskeletal: She exhibits edema and tenderness.  RLE mild edema. R hip pain with ROM and weight bearing.   Lymphadenopathy:    She has no cervical adenopathy.  Neurological: She is alert and oriented to person, place, and time. She has normal reflexes. She displays normal reflexes. No cranial nerve deficit. She exhibits normal muscle tone. Coordination normal.  Skin: Skin is warm and dry. No rash noted. She is  not diaphoretic. No erythema. No pallor.  Psychiatric: She has a normal mood and affect. Her behavior is normal. Judgment and thought content normal.  Insomnia, home med Temazepam nightly     Labs reviewed: Basic Metabolic Panel:  Recent Labs  07/31/15 0526 08/02/15 0418 08/03/15 0350  NA 134* 131* 131*  K 3.5 3.8 3.3*  CL 94* 92* 94*  CO2 33* 33* 30  GLUCOSE 137* 125* 117*  BUN 18 24* 30*  CREATININE 0.79 1.15* 1.13*  CALCIUM 9.6 8.9 8.2*    Liver Function Tests:  Recent Labs  07/30/15 1539  AST 25  ALT 21  ALKPHOS 104  BILITOT 1.5*  PROT 7.2  ALBUMIN 4.3    CBC:  Recent Labs  07/30/15 1539 07/31/15 0526 08/02/15 0418 08/03/15 0350  WBC 12.4* 11.9* 11.4* 9.9  NEUTROABS 10.2*  --   --   --   HGB 14.5 13.3 9.8* 7.8*  HCT 42.8 39.4 29.2* 22.5*  MCV 90.7 91.4 93.3 90.4  PLT 187 189 129* 116*    Lab Results  Component Value Date   TSH 0.803 08/03/2013   Lab Results  Component Value Date   HGBA1C 5.8* 04/26/2013   Lab Results  Component Value Date   CHOL 116 04/26/2013   HDL 45 04/26/2013   LDLCALC 60 04/26/2013   TRIG 56 04/26/2013   CHOLHDL 2.6 04/26/2013    Significant Diagnostic Results since last visit: none  Patient Care Team: Estill Dooms, MD as PCP - General (Internal Medicine) Earlie Server, MD as Consulting Physician (Orthopedic Surgery) Lorretta Harp, MD as Consulting Physician (Cardiology) Calvert Cantor, MD as Consulting Physician (Ophthalmology)  Assessment/Plan Problem List Items Addressed This Visit    Hypertension - Primary (Chronic)    Controlled, continue combination of Cardizem, beta blocker along with diuretic and ACE inhibitor.      Hypothyroidism (Chronic)    08/06/15 TSH 8.396 08/07/15 increase Levothyroxine to 157mcg, update TSH in 6 weeks.       Postoperative anemia due  to acute blood loss (Chronic)    Taking Fe bid, Hgb 7.9 08/06/15, Vit B12 811, Folate 11, Iron 30, will repeat CBC in one week       Paroxysmal atrial fibrillation (Chronic)    Heart rate is in control, taking Eliquis       Hyponatremia (Chronic)    Resolved, serum Na 136 08/06/15          Family/ staff Communication: adjust medications.   Labs/tests ordered: CBC, CMP, TSH, anemia panel, CXR done 08/06/15. Update CBC one week, TSH 6 weeks.   [facility] Mast NP Geriatrics [facility] [phone], [place] [address] On Call:  [phone] & follow prompts after 5pm & weekends Office Phone:  [phone] Office Fax:  [phone]

## 2015-08-07 NOTE — Assessment & Plan Note (Signed)
Taking Fe bid, Hgb 7.9 08/06/15, Vit B12 811, Folate 11, Iron 30, will repeat CBC in one week

## 2015-08-07 NOTE — Assessment & Plan Note (Signed)
Controlled, continue combination of Cardizem, beta blocker along with diuretic and ACE inhibitor.

## 2015-08-07 NOTE — Assessment & Plan Note (Signed)
08/06/15 TSH 8.396 08/07/15 increase Levothyroxine to 137mcg, update TSH in 6 weeks.

## 2015-08-07 NOTE — Assessment & Plan Note (Signed)
Resolved, serum Na 136 08/06/15

## 2015-08-10 LAB — HEMOGLOBIN A1C: Hgb A1c MFr Bld: 5.9 % (ref 4.0–6.0)

## 2015-08-11 ENCOUNTER — Other Ambulatory Visit: Payer: Self-pay | Admitting: Nurse Practitioner

## 2015-08-11 DIAGNOSIS — F3177 Bipolar disorder, in partial remission, most recent episode mixed: Secondary | ICD-10-CM

## 2015-08-11 DIAGNOSIS — E119 Type 2 diabetes mellitus without complications: Secondary | ICD-10-CM

## 2015-08-12 DIAGNOSIS — S72141D Displaced intertrochanteric fracture of right femur, subsequent encounter for closed fracture with routine healing: Secondary | ICD-10-CM | POA: Diagnosis not present

## 2015-08-13 LAB — CBC AND DIFFERENTIAL
HCT: 25 % — AB (ref 36–46)
Hemoglobin: 8.4 g/dL — AB (ref 12.0–16.0)
Platelets: 476 10*3/uL — AB (ref 150–399)
WBC: 9 10^3/mL

## 2015-08-14 ENCOUNTER — Other Ambulatory Visit: Payer: Self-pay | Admitting: Nurse Practitioner

## 2015-08-14 DIAGNOSIS — D62 Acute posthemorrhagic anemia: Secondary | ICD-10-CM

## 2015-08-21 ENCOUNTER — Other Ambulatory Visit: Payer: Self-pay | Admitting: *Deleted

## 2015-08-21 MED ORDER — HYDROCODONE-ACETAMINOPHEN 5-325 MG PO TABS: ORAL_TABLET | ORAL | Status: DC

## 2015-08-21 NOTE — Telephone Encounter (Signed)
[facility] 

## 2015-09-09 DIAGNOSIS — S72141D Displaced intertrochanteric fracture of right femur, subsequent encounter for closed fracture with routine healing: Secondary | ICD-10-CM | POA: Diagnosis not present

## 2015-09-14 DIAGNOSIS — R2681 Unsteadiness on feet: Secondary | ICD-10-CM | POA: Diagnosis not present

## 2015-09-14 DIAGNOSIS — R29898 Other symptoms and signs involving the musculoskeletal system: Secondary | ICD-10-CM | POA: Diagnosis not present

## 2015-09-14 DIAGNOSIS — M6281 Muscle weakness (generalized): Secondary | ICD-10-CM | POA: Diagnosis not present

## 2015-09-14 DIAGNOSIS — R4189 Other symptoms and signs involving cognitive functions and awareness: Secondary | ICD-10-CM | POA: Diagnosis not present

## 2015-09-15 DIAGNOSIS — R29898 Other symptoms and signs involving the musculoskeletal system: Secondary | ICD-10-CM | POA: Diagnosis not present

## 2015-09-15 DIAGNOSIS — M6281 Muscle weakness (generalized): Secondary | ICD-10-CM | POA: Diagnosis not present

## 2015-09-15 DIAGNOSIS — R2681 Unsteadiness on feet: Secondary | ICD-10-CM | POA: Diagnosis not present

## 2015-09-15 DIAGNOSIS — R4189 Other symptoms and signs involving cognitive functions and awareness: Secondary | ICD-10-CM | POA: Diagnosis not present

## 2015-09-17 DIAGNOSIS — R2681 Unsteadiness on feet: Secondary | ICD-10-CM | POA: Diagnosis not present

## 2015-09-17 DIAGNOSIS — R29898 Other symptoms and signs involving the musculoskeletal system: Secondary | ICD-10-CM | POA: Diagnosis not present

## 2015-09-17 DIAGNOSIS — M6281 Muscle weakness (generalized): Secondary | ICD-10-CM | POA: Diagnosis not present

## 2015-09-17 DIAGNOSIS — R4189 Other symptoms and signs involving cognitive functions and awareness: Secondary | ICD-10-CM | POA: Diagnosis not present

## 2015-09-18 DIAGNOSIS — M6281 Muscle weakness (generalized): Secondary | ICD-10-CM | POA: Diagnosis not present

## 2015-09-18 DIAGNOSIS — R4189 Other symptoms and signs involving cognitive functions and awareness: Secondary | ICD-10-CM | POA: Diagnosis not present

## 2015-09-18 DIAGNOSIS — R29898 Other symptoms and signs involving the musculoskeletal system: Secondary | ICD-10-CM | POA: Diagnosis not present

## 2015-09-18 DIAGNOSIS — R2681 Unsteadiness on feet: Secondary | ICD-10-CM | POA: Diagnosis not present

## 2015-09-21 DIAGNOSIS — R29898 Other symptoms and signs involving the musculoskeletal system: Secondary | ICD-10-CM | POA: Diagnosis not present

## 2015-09-21 DIAGNOSIS — M6281 Muscle weakness (generalized): Secondary | ICD-10-CM | POA: Diagnosis not present

## 2015-09-21 DIAGNOSIS — R2681 Unsteadiness on feet: Secondary | ICD-10-CM | POA: Diagnosis not present

## 2015-09-21 DIAGNOSIS — R4189 Other symptoms and signs involving cognitive functions and awareness: Secondary | ICD-10-CM | POA: Diagnosis not present

## 2015-09-23 DIAGNOSIS — M6281 Muscle weakness (generalized): Secondary | ICD-10-CM | POA: Diagnosis not present

## 2015-09-23 DIAGNOSIS — R4189 Other symptoms and signs involving cognitive functions and awareness: Secondary | ICD-10-CM | POA: Diagnosis not present

## 2015-09-23 DIAGNOSIS — R29898 Other symptoms and signs involving the musculoskeletal system: Secondary | ICD-10-CM | POA: Diagnosis not present

## 2015-09-23 DIAGNOSIS — R2681 Unsteadiness on feet: Secondary | ICD-10-CM | POA: Diagnosis not present

## 2015-09-24 ENCOUNTER — Other Ambulatory Visit: Payer: Self-pay

## 2015-09-24 DIAGNOSIS — R29898 Other symptoms and signs involving the musculoskeletal system: Secondary | ICD-10-CM | POA: Diagnosis not present

## 2015-09-24 DIAGNOSIS — M6281 Muscle weakness (generalized): Secondary | ICD-10-CM | POA: Diagnosis not present

## 2015-09-24 DIAGNOSIS — R4189 Other symptoms and signs involving cognitive functions and awareness: Secondary | ICD-10-CM | POA: Diagnosis not present

## 2015-09-24 DIAGNOSIS — R2681 Unsteadiness on feet: Secondary | ICD-10-CM | POA: Diagnosis not present

## 2015-09-24 MED ORDER — LEVOTHYROXINE SODIUM 125 MCG PO TABS: 125.0000 ug | ORAL_TABLET | Freq: Every day | ORAL | Status: DC

## 2015-09-25 DIAGNOSIS — R29898 Other symptoms and signs involving the musculoskeletal system: Secondary | ICD-10-CM | POA: Diagnosis not present

## 2015-09-25 DIAGNOSIS — M6281 Muscle weakness (generalized): Secondary | ICD-10-CM | POA: Diagnosis not present

## 2015-09-25 DIAGNOSIS — R2681 Unsteadiness on feet: Secondary | ICD-10-CM | POA: Diagnosis not present

## 2015-09-25 DIAGNOSIS — R4189 Other symptoms and signs involving cognitive functions and awareness: Secondary | ICD-10-CM | POA: Diagnosis not present

## 2015-09-28 DIAGNOSIS — R4189 Other symptoms and signs involving cognitive functions and awareness: Secondary | ICD-10-CM | POA: Diagnosis not present

## 2015-09-28 DIAGNOSIS — R29898 Other symptoms and signs involving the musculoskeletal system: Secondary | ICD-10-CM | POA: Diagnosis not present

## 2015-09-28 DIAGNOSIS — R2681 Unsteadiness on feet: Secondary | ICD-10-CM | POA: Diagnosis not present

## 2015-09-28 DIAGNOSIS — M6281 Muscle weakness (generalized): Secondary | ICD-10-CM | POA: Diagnosis not present

## 2015-09-29 DIAGNOSIS — M6281 Muscle weakness (generalized): Secondary | ICD-10-CM | POA: Diagnosis not present

## 2015-09-29 DIAGNOSIS — R4189 Other symptoms and signs involving cognitive functions and awareness: Secondary | ICD-10-CM | POA: Diagnosis not present

## 2015-09-29 DIAGNOSIS — R29898 Other symptoms and signs involving the musculoskeletal system: Secondary | ICD-10-CM | POA: Diagnosis not present

## 2015-09-29 DIAGNOSIS — R2681 Unsteadiness on feet: Secondary | ICD-10-CM | POA: Diagnosis not present

## 2015-09-30 DIAGNOSIS — M6281 Muscle weakness (generalized): Secondary | ICD-10-CM | POA: Diagnosis not present

## 2015-09-30 DIAGNOSIS — R29898 Other symptoms and signs involving the musculoskeletal system: Secondary | ICD-10-CM | POA: Diagnosis not present

## 2015-09-30 DIAGNOSIS — R4189 Other symptoms and signs involving cognitive functions and awareness: Secondary | ICD-10-CM | POA: Diagnosis not present

## 2015-09-30 DIAGNOSIS — R2681 Unsteadiness on feet: Secondary | ICD-10-CM | POA: Diagnosis not present

## 2015-10-02 DIAGNOSIS — R29898 Other symptoms and signs involving the musculoskeletal system: Secondary | ICD-10-CM | POA: Diagnosis not present

## 2015-10-02 DIAGNOSIS — R2681 Unsteadiness on feet: Secondary | ICD-10-CM | POA: Diagnosis not present

## 2015-10-02 DIAGNOSIS — R4189 Other symptoms and signs involving cognitive functions and awareness: Secondary | ICD-10-CM | POA: Diagnosis not present

## 2015-10-02 DIAGNOSIS — M6281 Muscle weakness (generalized): Secondary | ICD-10-CM | POA: Diagnosis not present

## 2015-10-06 DIAGNOSIS — R4189 Other symptoms and signs involving cognitive functions and awareness: Secondary | ICD-10-CM | POA: Diagnosis not present

## 2015-10-06 DIAGNOSIS — M6281 Muscle weakness (generalized): Secondary | ICD-10-CM | POA: Diagnosis not present

## 2015-10-06 DIAGNOSIS — R29898 Other symptoms and signs involving the musculoskeletal system: Secondary | ICD-10-CM | POA: Diagnosis not present

## 2015-10-06 DIAGNOSIS — R2681 Unsteadiness on feet: Secondary | ICD-10-CM | POA: Diagnosis not present

## 2015-10-07 DIAGNOSIS — S72141D Displaced intertrochanteric fracture of right femur, subsequent encounter for closed fracture with routine healing: Secondary | ICD-10-CM | POA: Diagnosis not present

## 2015-10-08 DIAGNOSIS — M6281 Muscle weakness (generalized): Secondary | ICD-10-CM | POA: Diagnosis not present

## 2015-10-08 DIAGNOSIS — R29898 Other symptoms and signs involving the musculoskeletal system: Secondary | ICD-10-CM | POA: Diagnosis not present

## 2015-10-08 DIAGNOSIS — R2681 Unsteadiness on feet: Secondary | ICD-10-CM | POA: Diagnosis not present

## 2015-10-08 DIAGNOSIS — R4189 Other symptoms and signs involving cognitive functions and awareness: Secondary | ICD-10-CM | POA: Diagnosis not present

## 2015-10-09 DIAGNOSIS — R4189 Other symptoms and signs involving cognitive functions and awareness: Secondary | ICD-10-CM | POA: Diagnosis not present

## 2015-10-09 DIAGNOSIS — M6281 Muscle weakness (generalized): Secondary | ICD-10-CM | POA: Diagnosis not present

## 2015-10-09 DIAGNOSIS — R2681 Unsteadiness on feet: Secondary | ICD-10-CM | POA: Diagnosis not present

## 2015-10-09 DIAGNOSIS — R29898 Other symptoms and signs involving the musculoskeletal system: Secondary | ICD-10-CM | POA: Diagnosis not present

## 2015-10-12 DIAGNOSIS — R4189 Other symptoms and signs involving cognitive functions and awareness: Secondary | ICD-10-CM | POA: Diagnosis not present

## 2015-10-12 DIAGNOSIS — R2681 Unsteadiness on feet: Secondary | ICD-10-CM | POA: Diagnosis not present

## 2015-10-12 DIAGNOSIS — M6281 Muscle weakness (generalized): Secondary | ICD-10-CM | POA: Diagnosis not present

## 2015-10-12 DIAGNOSIS — R29898 Other symptoms and signs involving the musculoskeletal system: Secondary | ICD-10-CM | POA: Diagnosis not present

## 2015-10-15 DIAGNOSIS — R2681 Unsteadiness on feet: Secondary | ICD-10-CM | POA: Diagnosis not present

## 2015-10-15 DIAGNOSIS — M6281 Muscle weakness (generalized): Secondary | ICD-10-CM | POA: Diagnosis not present

## 2015-10-15 DIAGNOSIS — Z4789 Encounter for other orthopedic aftercare: Secondary | ICD-10-CM | POA: Diagnosis not present

## 2015-10-20 DIAGNOSIS — Z974 Presence of external hearing-aid: Secondary | ICD-10-CM | POA: Diagnosis not present

## 2015-10-20 DIAGNOSIS — I4891 Unspecified atrial fibrillation: Secondary | ICD-10-CM | POA: Diagnosis not present

## 2015-10-20 DIAGNOSIS — E039 Hypothyroidism, unspecified: Secondary | ICD-10-CM | POA: Diagnosis not present

## 2015-10-20 DIAGNOSIS — R600 Localized edema: Secondary | ICD-10-CM | POA: Diagnosis not present

## 2015-10-20 DIAGNOSIS — H9190 Unspecified hearing loss, unspecified ear: Secondary | ICD-10-CM | POA: Diagnosis not present

## 2015-10-20 DIAGNOSIS — R2241 Localized swelling, mass and lump, right lower limb: Secondary | ICD-10-CM | POA: Diagnosis not present

## 2015-10-20 DIAGNOSIS — R2681 Unsteadiness on feet: Secondary | ICD-10-CM | POA: Diagnosis not present

## 2015-10-20 DIAGNOSIS — G4700 Insomnia, unspecified: Secondary | ICD-10-CM | POA: Diagnosis not present

## 2015-10-20 DIAGNOSIS — I10 Essential (primary) hypertension: Secondary | ICD-10-CM | POA: Diagnosis not present

## 2015-10-20 DIAGNOSIS — Z0000 Encounter for general adult medical examination without abnormal findings: Secondary | ICD-10-CM | POA: Diagnosis not present

## 2015-10-20 DIAGNOSIS — Z23 Encounter for immunization: Secondary | ICD-10-CM | POA: Diagnosis not present

## 2015-10-20 DIAGNOSIS — E785 Hyperlipidemia, unspecified: Secondary | ICD-10-CM | POA: Diagnosis not present

## 2015-10-21 DIAGNOSIS — M6281 Muscle weakness (generalized): Secondary | ICD-10-CM | POA: Diagnosis not present

## 2015-10-21 DIAGNOSIS — R2681 Unsteadiness on feet: Secondary | ICD-10-CM | POA: Diagnosis not present

## 2015-10-21 DIAGNOSIS — Z4789 Encounter for other orthopedic aftercare: Secondary | ICD-10-CM | POA: Diagnosis not present

## 2015-10-27 DIAGNOSIS — S72141D Displaced intertrochanteric fracture of right femur, subsequent encounter for closed fracture with routine healing: Secondary | ICD-10-CM | POA: Diagnosis not present

## 2015-10-27 DIAGNOSIS — M25551 Pain in right hip: Secondary | ICD-10-CM | POA: Diagnosis not present

## 2015-10-28 DIAGNOSIS — M6281 Muscle weakness (generalized): Secondary | ICD-10-CM | POA: Diagnosis not present

## 2015-10-28 DIAGNOSIS — Z4789 Encounter for other orthopedic aftercare: Secondary | ICD-10-CM | POA: Diagnosis not present

## 2015-10-28 DIAGNOSIS — R2681 Unsteadiness on feet: Secondary | ICD-10-CM | POA: Diagnosis not present

## 2015-10-29 DIAGNOSIS — M6281 Muscle weakness (generalized): Secondary | ICD-10-CM | POA: Diagnosis not present

## 2015-10-29 DIAGNOSIS — Z4789 Encounter for other orthopedic aftercare: Secondary | ICD-10-CM | POA: Diagnosis not present

## 2015-10-29 DIAGNOSIS — R2681 Unsteadiness on feet: Secondary | ICD-10-CM | POA: Diagnosis not present

## 2015-11-03 DIAGNOSIS — M6281 Muscle weakness (generalized): Secondary | ICD-10-CM | POA: Diagnosis not present

## 2015-11-03 DIAGNOSIS — Z4789 Encounter for other orthopedic aftercare: Secondary | ICD-10-CM | POA: Diagnosis not present

## 2015-11-03 DIAGNOSIS — R2681 Unsteadiness on feet: Secondary | ICD-10-CM | POA: Diagnosis not present

## 2015-11-10 DIAGNOSIS — M76899 Other specified enthesopathies of unspecified lower limb, excluding foot: Secondary | ICD-10-CM | POA: Diagnosis not present

## 2015-11-17 DIAGNOSIS — S72141D Displaced intertrochanteric fracture of right femur, subsequent encounter for closed fracture with routine healing: Secondary | ICD-10-CM | POA: Diagnosis not present

## 2015-11-26 DIAGNOSIS — S72141D Displaced intertrochanteric fracture of right femur, subsequent encounter for closed fracture with routine healing: Secondary | ICD-10-CM | POA: Diagnosis not present

## 2015-12-18 DIAGNOSIS — M76891 Other specified enthesopathies of right lower limb, excluding foot: Secondary | ICD-10-CM | POA: Diagnosis not present

## 2015-12-24 DIAGNOSIS — M76891 Other specified enthesopathies of right lower limb, excluding foot: Secondary | ICD-10-CM | POA: Diagnosis not present

## 2015-12-25 DIAGNOSIS — M76891 Other specified enthesopathies of right lower limb, excluding foot: Secondary | ICD-10-CM | POA: Diagnosis not present

## 2015-12-28 DIAGNOSIS — M76899 Other specified enthesopathies of unspecified lower limb, excluding foot: Secondary | ICD-10-CM | POA: Diagnosis not present

## 2015-12-30 DIAGNOSIS — M76891 Other specified enthesopathies of right lower limb, excluding foot: Secondary | ICD-10-CM | POA: Diagnosis not present

## 2015-12-30 DIAGNOSIS — S72141D Displaced intertrochanteric fracture of right femur, subsequent encounter for closed fracture with routine healing: Secondary | ICD-10-CM | POA: Diagnosis not present

## 2016-01-01 DIAGNOSIS — M76899 Other specified enthesopathies of unspecified lower limb, excluding foot: Secondary | ICD-10-CM | POA: Diagnosis not present

## 2016-01-04 DIAGNOSIS — M7631 Iliotibial band syndrome, right leg: Secondary | ICD-10-CM | POA: Diagnosis not present

## 2016-01-13 DIAGNOSIS — R2681 Unsteadiness on feet: Secondary | ICD-10-CM | POA: Diagnosis not present

## 2016-01-13 DIAGNOSIS — Z4789 Encounter for other orthopedic aftercare: Secondary | ICD-10-CM | POA: Diagnosis not present

## 2016-01-13 DIAGNOSIS — M25551 Pain in right hip: Secondary | ICD-10-CM | POA: Diagnosis not present

## 2016-01-13 DIAGNOSIS — M6281 Muscle weakness (generalized): Secondary | ICD-10-CM | POA: Diagnosis not present

## 2016-01-13 DIAGNOSIS — M21751 Unequal limb length (acquired), right femur: Secondary | ICD-10-CM | POA: Diagnosis not present

## 2016-01-18 DIAGNOSIS — M25551 Pain in right hip: Secondary | ICD-10-CM | POA: Diagnosis not present

## 2016-01-18 DIAGNOSIS — M21751 Unequal limb length (acquired), right femur: Secondary | ICD-10-CM | POA: Diagnosis not present

## 2016-01-18 DIAGNOSIS — R2681 Unsteadiness on feet: Secondary | ICD-10-CM | POA: Diagnosis not present

## 2016-01-18 DIAGNOSIS — M6281 Muscle weakness (generalized): Secondary | ICD-10-CM | POA: Diagnosis not present

## 2016-01-18 DIAGNOSIS — Z4789 Encounter for other orthopedic aftercare: Secondary | ICD-10-CM | POA: Diagnosis not present

## 2016-01-20 ENCOUNTER — Other Ambulatory Visit: Payer: Self-pay | Admitting: Orthopedic Surgery

## 2016-01-20 DIAGNOSIS — M25551 Pain in right hip: Secondary | ICD-10-CM | POA: Diagnosis not present

## 2016-01-20 DIAGNOSIS — S72141D Displaced intertrochanteric fracture of right femur, subsequent encounter for closed fracture with routine healing: Secondary | ICD-10-CM | POA: Diagnosis not present

## 2016-01-20 DIAGNOSIS — T148XXA Other injury of unspecified body region, initial encounter: Secondary | ICD-10-CM

## 2016-01-25 ENCOUNTER — Ambulatory Visit
Admission: RE | Admit: 2016-01-25 | Discharge: 2016-01-25 | Disposition: A | Discharge status: Home / Self Care | Payer: Medicare Other | Source: Ambulatory Visit | Attending: Orthopedic Surgery | Admitting: Orthopedic Surgery

## 2016-01-25 DIAGNOSIS — S72121K Displaced fracture of lesser trochanter of right femur, subsequent encounter for closed fracture with nonunion: Secondary | ICD-10-CM | POA: Diagnosis not present

## 2016-01-25 DIAGNOSIS — T148XXA Other injury of unspecified body region, initial encounter: Secondary | ICD-10-CM

## 2016-01-25 DIAGNOSIS — M21761 Unequal limb length (acquired), right tibia: Secondary | ICD-10-CM | POA: Diagnosis not present

## 2016-01-29 DIAGNOSIS — M21751 Unequal limb length (acquired), right femur: Secondary | ICD-10-CM | POA: Diagnosis not present

## 2016-01-29 DIAGNOSIS — M25551 Pain in right hip: Secondary | ICD-10-CM | POA: Diagnosis not present

## 2016-01-29 DIAGNOSIS — R2681 Unsteadiness on feet: Secondary | ICD-10-CM | POA: Diagnosis not present

## 2016-01-29 DIAGNOSIS — Z4789 Encounter for other orthopedic aftercare: Secondary | ICD-10-CM | POA: Diagnosis not present

## 2016-01-29 DIAGNOSIS — M6281 Muscle weakness (generalized): Secondary | ICD-10-CM | POA: Diagnosis not present

## 2016-02-03 DIAGNOSIS — Z4789 Encounter for other orthopedic aftercare: Secondary | ICD-10-CM | POA: Diagnosis not present

## 2016-02-03 DIAGNOSIS — M6281 Muscle weakness (generalized): Secondary | ICD-10-CM | POA: Diagnosis not present

## 2016-02-03 DIAGNOSIS — M21751 Unequal limb length (acquired), right femur: Secondary | ICD-10-CM | POA: Diagnosis not present

## 2016-02-03 DIAGNOSIS — R2681 Unsteadiness on feet: Secondary | ICD-10-CM | POA: Diagnosis not present

## 2016-02-03 DIAGNOSIS — M25551 Pain in right hip: Secondary | ICD-10-CM | POA: Diagnosis not present

## 2016-02-09 ENCOUNTER — Other Ambulatory Visit: Payer: Self-pay | Admitting: Cardiovascular Disease

## 2016-02-09 NOTE — Telephone Encounter (Signed)
REFILL 

## 2016-02-19 DIAGNOSIS — M25551 Pain in right hip: Secondary | ICD-10-CM | POA: Diagnosis not present

## 2016-02-19 DIAGNOSIS — M810 Age-related osteoporosis without current pathological fracture: Secondary | ICD-10-CM | POA: Diagnosis not present

## 2016-02-19 DIAGNOSIS — R5383 Other fatigue: Secondary | ICD-10-CM | POA: Diagnosis not present

## 2016-02-19 DIAGNOSIS — E559 Vitamin D deficiency, unspecified: Secondary | ICD-10-CM | POA: Diagnosis not present

## 2016-02-25 DIAGNOSIS — M810 Age-related osteoporosis without current pathological fracture: Secondary | ICD-10-CM | POA: Diagnosis not present

## 2016-02-26 DIAGNOSIS — M810 Age-related osteoporosis without current pathological fracture: Secondary | ICD-10-CM | POA: Diagnosis not present

## 2016-02-26 DIAGNOSIS — M25551 Pain in right hip: Secondary | ICD-10-CM | POA: Diagnosis not present

## 2016-02-29 ENCOUNTER — Other Ambulatory Visit: Payer: Self-pay | Admitting: Internal Medicine

## 2016-02-29 DIAGNOSIS — S72141D Displaced intertrochanteric fracture of right femur, subsequent encounter for closed fracture with routine healing: Secondary | ICD-10-CM | POA: Diagnosis not present

## 2016-03-02 ENCOUNTER — Telehealth: Payer: Self-pay

## 2016-03-02 NOTE — Telephone Encounter (Signed)
Paperwork received from Raliegh Ip for clearance for Right hip surgery scheduled on 04/26/16; pt need annual appt with Dr. Gwenlyn Found.  Pt contacted and scheduled on 4/25 @ 11:30pm for cardiac clearance.

## 2016-03-08 ENCOUNTER — Telehealth: Payer: Self-pay | Admitting: Cardiovascular Disease

## 2016-03-08 ENCOUNTER — Ambulatory Visit (INDEPENDENT_AMBULATORY_CARE_PROVIDER_SITE_OTHER): Payer: Medicare Other | Admitting: Physician Assistant

## 2016-03-08 ENCOUNTER — Encounter: Payer: Self-pay | Admitting: Physician Assistant

## 2016-03-08 VITALS — BP 130/60 | HR 61 | Ht 63.0 in | Wt 130.0 lb

## 2016-03-08 DIAGNOSIS — Z01810 Encounter for preprocedural cardiovascular examination: Secondary | ICD-10-CM | POA: Diagnosis not present

## 2016-03-08 DIAGNOSIS — I480 Paroxysmal atrial fibrillation: Secondary | ICD-10-CM | POA: Diagnosis not present

## 2016-03-08 DIAGNOSIS — E785 Hyperlipidemia, unspecified: Secondary | ICD-10-CM

## 2016-03-08 DIAGNOSIS — E119 Type 2 diabetes mellitus without complications: Secondary | ICD-10-CM

## 2016-03-08 DIAGNOSIS — Z01818 Encounter for other preprocedural examination: Secondary | ICD-10-CM | POA: Insufficient documentation

## 2016-03-08 DIAGNOSIS — I10 Essential (primary) hypertension: Secondary | ICD-10-CM | POA: Diagnosis not present

## 2016-03-08 DIAGNOSIS — Z7901 Long term (current) use of anticoagulants: Secondary | ICD-10-CM

## 2016-03-08 NOTE — Telephone Encounter (Signed)
Paged Trish at [phone] for Leane Call on Friday 03-11-16 for [name].

## 2016-03-08 NOTE — Progress Notes (Signed)
[name], female   DOB: 09-24-1922, 82 y.o.   MRN: [id number]    Date:  03/08/2016   ID:  Leah Holland, DOB 08-24-1922, MRN [id number]  PCP:  Anthoney Harada, MD  Primary Cardiologist:  Gwenlyn Found  Chief Complaint  Patient presents with  . Surgical Clearance    hip replacement currently scheduled for May 16th performed by Dr [name]. patient reports no complaints.     History of Present Illness: Leah Holland is a 82 y.o. female widowed, Caucasian female, mother of two living children (one deceased), grandmother to five grandchildren. She is the youngest of 10 children.  One son who is now a retired Lexicographer and one son who is a Biomedical scientist in [employer]. She has a history of hypertension and hyperlipidemia, PAF on Eliquis as well as family history of heart disease. She had a remote right total knee replacement and a recent left one by Dr. French Ana. She had a Myoview stress test performed in our office on August 07, 2011, which was low risk. Recent lab work performed by Dr. Greta Doom revealed a total cholesterol of 133, HDL of 71, and HDL of 39.   She presented with apparent new onset atrial fibrillation with RVR. She was just seen at the [facility] emergency room on 08/03/2013 for chest pain. She was diagnosed with gastroesophageal reflux disease and a UTI. At that time she was in normal sinus rhythm at a rate of 79 beats per minute and with PACs. She reported dizziness, feeling tired and a racing HR since ~Sat/Sun.    She was admitted Sept 2014 with afib RVR and started on IV diltiazem 5mg /hr and Eliquis 2.5mg  twice daily. She converted to NSR at 0246hrs. She was changed to PO Cardizem 120mg  daily. She takes lasix 20mg  daily as needed a long with 99meq as needed potassium.   She presents for preop clearance.  She had a right hip fracture last August and it is not healing properly so she needs to have another surgery.  She is normally a very active person; drives her  friends to lunch, etc... She has been in a lot of pain because of the fracture.  She has had some right lower extremity edema and has been taking Lasix as needed.  Seems to be a little bit better in the morning.  The patient currently denies nausea, vomiting, fever, chest pain, shortness of breath, orthopnea, dizziness, PND, cough, congestion, abdominal pain, hematochezia, melena, , claudication.  Wt Readings from Last 3 Encounters:  03/08/16 130 lb (58.968 kg)  08/06/15 138 lb (62.596 kg)  08/03/15 145 lb 5.6 oz (65.93 kg)     Past Medical History  Diagnosis Date  . Complication of anesthesia   . PONV (postoperative nausea and vomiting)   . Hypertension   . Hyperlipidemia   . Swelling of extremity     sees Dr. Gwenlyn Found, cardiologist 1 x year [phone]  . Bronchitis 12/2011  . Hypothyroidism     takes synthroid  . GERD (gastroesophageal reflux disease)   . Neuromuscular disorder ([place])     carpal tunnel in right hand  . Insomnia   . Hyperlipidemia   . Skin cancer of face 2000's    "one on each side of my face and one on my nose" (08/13/2013)  . Diabetes mellitus type 2, diet-controlled ([place])   . History of blood transfusion     "w/both knee OR's" (08/13/2013)  . H/O hiatal hernia   . Arthritis     "  probably" (08/13/2013)  . Atrial fibrillation ([place])     new onset/pt report 08/13/2013  . Depression   . Anxiety attack     "take RX prn" (08/13/2013)  . Intertrochanteric fracture of right hip ([place]) 07/30/2015    Current Outpatient Prescriptions  Medication Sig Dispense Refill  . acetaminophen (TYLENOL) 325 MG tablet Take 650 mg by mouth every 6 (six) hours as needed for pain.    Marland Kitchen apixaban (ELIQUIS) 2.5 MG TABS tablet Take 1 tablet (2.5 mg total) by mouth 2 (two) times daily. 60 tablet 0  . aspirin EC 81 MG tablet Take 81 mg by mouth daily.    Marland Kitchen atorvastatin (LIPITOR) 80 MG tablet Take 80 mg by mouth daily.    . benazepril (LOTENSIN) 40 MG tablet Take 1 tablet (40 mg total) by mouth  every evening.    . chlorthalidone (HYGROTON) 25 MG tablet TAKE 1 TABLET ONCE DAILY. 30 tablet 10  . diltiazem (CARDIZEM CD) 180 MG 24 hr capsule Take 1 capsule (180 mg total) by mouth daily. NEED OV. 30 capsule 0  . esomeprazole (NEXIUM) 40 MG capsule Take 1 capsule (40 mg total) by mouth daily at 12 noon.    . ferrous sulfate 325 (65 FE) MG tablet Take 1 tablet (325 mg total) by mouth 2 (two) times daily with a meal. 60 tablet 0  . Flaxseed, Linseed, (FLAXSEED OIL) 1000 MG CAPS 1 pill po bid  0  . FLUoxetine (PROZAC) 20 MG capsule Take 1 capsule by mouth daily.    Marland Kitchen FLUoxetine (PROZAC) 40 MG capsule Take 1 capsule by mouth daily.    . furosemide (LASIX) 20 MG tablet Take 1 tablet (20 mg total) by mouth as needed for edema. (Patient taking differently: Take 20 mg by mouth as needed for edema (gain 3LBS or more). ) 30 tablet 5  . HYDROcodone-acetaminophen (NORCO) 5-325 MG per tablet Take one to two tablets by mouth every 6 hours as needed for moderate pain 240 tablet 0  . metoprolol (LOPRESSOR) 50 MG tablet Take 50 mg by mouth 2 (two) times daily.    . Multiple Vitamins-Minerals (MACULAR VITAMIN BENEFIT) TABS 1 pill by mouth twice a day. There is no dose for multivitamin.    Marland Kitchen oxyCODONE-acetaminophen (PERCOCET/ROXICET) 5-325 MG tablet Take 1 tablet by mouth every 6 (six) hours as needed.    Vladimir Faster Glycol-Propyl Glycol (SYSTANE) 0.4-0.3 % SOLN Apply 1 drop to eye 3 (three) times daily as needed (dry eye).    . potassium chloride (K-DUR) 10 MEQ tablet Take 1 tablet (10 mEq total) by mouth as needed. (Patient taking differently: Take 10 mEq by mouth as needed (when taking lasix). ) 30 tablet 5  . PROBIOTIC CAPS Take 1 capsule by mouth daily as needed (diarrhea).     . sennosides-docusate sodium (SENOKOT-S) 8.6-50 MG tablet Take 2 tablets by mouth daily. 30 tablet 1  . SYNTHROID 125 MCG tablet TAKE 1 TABLET ONCE DAILY BEFORE BREAKFAST. 30 tablet 2  . temazepam (RESTORIL) 30 MG capsule Take 1  capsule (30 mg total) by mouth at bedtime as needed for sleep. 10 capsule 0   No current facility-administered medications for this visit.    Allergies:    Allergies  Allergen Reactions  . Morphine And Related Other (See Comments)    Made pt "wild"    Social History:  The patient  reports that she has never smoked. She has never used smokeless tobacco. She reports that she does not drink alcohol  or use illicit drugs.   Family history:   Family History  Problem Relation Age of Onset  . Arrhythmia Mother   . Heart disease Mother   . Arrhythmia Sister   . Arrhythmia Brother   . Heart disease Brother   . Stroke Father 105  . Heart disease Brother   . Heart disease Brother   . Cancer Brother     Colon cancer  . Heart disease Sister   . Heart disease Sister   . Stroke Sister   . Diabetes Sister   . Heart disease Child 14  . Heart disease Child 43    ROS:  Please see the history of present illness.  All other systems reviewed and negative.   PHYSICAL EXAM: VS:  BP 130/60 mmHg  Pulse 61  Ht 5\' 3"  (1.6 m)  Wt 130 lb (58.968 kg)  BMI 23.03 kg/m2 Well nourished, well developed, in no acute distress HEENT: Pupils are equal round react to light accommodation extraocular movements are intact.  Neck: no JVDNo cervical lymphadenopathy. Cardiac: Regular rate and rhythm without murmurs rubs or gallops. Lungs:  clear to auscultation bilaterally, no wheezing, rhonchi or rales Abd: soft, nontender, positive bowel sounds all quadrants, no hepatosplenomegaly Ext: no lower extremity edema.  2+ radial and dorsalis pedis pulses. Skin: warm and dry Neuro:  Grossly normal  EKG:  Normal sinus rhythm with PVCs septal Q waves. 61 bpm   ASSESSMENT AND PLAN:  Problem List Items Addressed This Visit    Preoperative clearance   Paroxysmal atrial fibrillation (HCC) (Chronic)   Hypertension (Chronic)   Hyperlipidemia (Chronic)   Diabetes mellitus type 2, diet-controlled (HCC) (Chronic)    Chronic anticoagulation (Chronic)    Other Visit Diagnoses    Encounter for pre-operative cardiovascular clearance    -  Primary    Relevant Orders    EKG 12-Lead    Myocardial Perfusion Imaging    PAF (paroxysmal atrial fibrillation) (HCC)        Relevant Orders    EKG 12-Lead    Myocardial Perfusion Imaging       His been 5 years since Mrs. Muratalla at her last stress test. I think it is reasonable to repeat this prior to giving clearance for her surgery.  She is currently maintaining normal sinus rhythm rate of 61 bpm. She is chronically anticoagulated with Eliquis 2.5mg  BID.  Blood pressure is controlled.  She currently takes Lipitor for hyperlipidemia.  I recommended if she continues to have some swelling in her right lower extremity in the morning then to take the Lasix. If it is resolving by morning that she does not need to take it. Is low likelihood she has a DVT because she is on Eliquis.  Surgeon:  Dr. [name] at [facility]

## 2016-03-08 NOTE — Patient Instructions (Addendum)
Your physician recommends that you continue on your current medications as directed. Please refer to the Current Medication list given to you today.  Your physician has requested that you have a lexiscan myoview. For further information please visit [email or web address]. Please follow instruction sheet, as given.  Tarri Fuller, PA-C, recommends that you schedule a follow-up appointment in 6 months with Dr Gwenlyn Found. You will receive a reminder letter in the mail two months in advance. If you don't receive a letter, please call our office to schedule the follow-up appointment.  If you need a refill on your cardiac medications before your next appointment, please call your pharmacy.

## 2016-03-10 DIAGNOSIS — M25551 Pain in right hip: Secondary | ICD-10-CM | POA: Diagnosis not present

## 2016-03-10 DIAGNOSIS — I4891 Unspecified atrial fibrillation: Secondary | ICD-10-CM | POA: Diagnosis not present

## 2016-03-10 DIAGNOSIS — R2681 Unsteadiness on feet: Secondary | ICD-10-CM | POA: Diagnosis not present

## 2016-03-10 DIAGNOSIS — E039 Hypothyroidism, unspecified: Secondary | ICD-10-CM | POA: Diagnosis not present

## 2016-03-10 DIAGNOSIS — E785 Hyperlipidemia, unspecified: Secondary | ICD-10-CM | POA: Diagnosis not present

## 2016-03-10 DIAGNOSIS — D509 Iron deficiency anemia, unspecified: Secondary | ICD-10-CM | POA: Diagnosis not present

## 2016-03-10 DIAGNOSIS — I10 Essential (primary) hypertension: Secondary | ICD-10-CM | POA: Diagnosis not present

## 2016-03-10 DIAGNOSIS — Z01818 Encounter for other preprocedural examination: Secondary | ICD-10-CM | POA: Diagnosis not present

## 2016-03-11 ENCOUNTER — Encounter (HOSPITAL_COMMUNITY)
Admission: RE | Admit: 2016-03-11 | Discharge: 2016-03-11 | Disposition: A | Discharge status: Home / Self Care | Payer: Medicare Other | Source: Ambulatory Visit | Attending: Physician Assistant | Admitting: Physician Assistant

## 2016-03-11 DIAGNOSIS — R079 Chest pain, unspecified: Secondary | ICD-10-CM | POA: Diagnosis not present

## 2016-03-11 DIAGNOSIS — Z01810 Encounter for preprocedural cardiovascular examination: Secondary | ICD-10-CM | POA: Diagnosis not present

## 2016-03-11 DIAGNOSIS — E119 Type 2 diabetes mellitus without complications: Secondary | ICD-10-CM

## 2016-03-11 DIAGNOSIS — I10 Essential (primary) hypertension: Secondary | ICD-10-CM | POA: Insufficient documentation

## 2016-03-11 DIAGNOSIS — E785 Hyperlipidemia, unspecified: Secondary | ICD-10-CM | POA: Insufficient documentation

## 2016-03-11 LAB — NM MYOCAR MULTI W/SPECT W/WALL MOTION / EF
Peak HR: 88 {beats}/min
Rest HR: 69 {beats}/min

## 2016-03-11 MED ORDER — TECHNETIUM TC 99M SESTAMIBI GENERIC - CARDIOLITE
30.0000 | Freq: Once | INTRAVENOUS | Status: AC | PRN
  Administered 2016-03-11: 30 via INTRAVENOUS

## 2016-03-11 MED ORDER — TECHNETIUM TC 99M SESTAMIBI GENERIC - CARDIOLITE
10.0000 | Freq: Once | INTRAVENOUS | Status: AC | PRN
  Administered 2016-03-11: 10 via INTRAVENOUS

## 2016-03-11 MED ORDER — SODIUM CHLORIDE 0.9 % IJ SOLN
80.0000 mg | INTRAVENOUS | Status: AC
  Administered 2016-03-11: 40 mg via INTRAVENOUS
  Filled 2016-03-11: qty 3.2

## 2016-03-11 MED ORDER — REGADENOSON 0.4 MG/5ML IV SOLN
0.4000 mg | Freq: Once | INTRAVENOUS | Status: AC
  Administered 2016-03-11: 0.4 mg via INTRAVENOUS

## 2016-03-11 MED ORDER — REGADENOSON 0.4 MG/5ML IV SOLN
INTRAVENOUS | Status: AC
  Filled 2016-03-11: qty 5

## 2016-03-15 ENCOUNTER — Telehealth: Payer: Self-pay | Admitting: Cardiovascular Disease

## 2016-03-15 DIAGNOSIS — S72141D Displaced intertrochanteric fracture of right femur, subsequent encounter for closed fracture with routine healing: Secondary | ICD-10-CM | POA: Diagnosis not present

## 2016-03-15 NOTE — Telephone Encounter (Signed)
Spoke with brandon, the pt has broken her hip and they are going to directly admit today. The pt is on eliquis and they plan to do surgery tomorrow at 2 pm. They would like for [place] to consult once she has been admitted. Advised she should not take her eliquis today. Spoke with trish at the hosp and consult information given.

## 2016-03-15 NOTE — Progress Notes (Signed)
Spoke to [place] at [employer] office. Pt w/ repeat hip fracture. Scheduled for surgery on 03/16/16. In a lot of pain and trying to get admitted tonight for cardiac and pain control clearance for surgery at 14:00 tomorrow. No beds available at this time. [place] to coordinate w/ bed control. If condition deteriorates pt to go to ED. Pt may have to wait until preop registration on 03/16/16.   Linna Darner, MD [facility] 03/15/2016, 4:19 PM

## 2016-03-15 NOTE — Telephone Encounter (Signed)
Please call asap,trying to admit pt. Pt needs surgery tomorrow and she is on Eliquis.

## 2016-03-16 ENCOUNTER — Inpatient Hospital Stay (HOSPITAL_COMMUNITY): Payer: Medicare Other

## 2016-03-16 ENCOUNTER — Encounter (HOSPITAL_COMMUNITY)
Admission: RE | Disposition: A | Discharge status: Skilled Nursing Facility | Payer: Self-pay | Source: Ambulatory Visit | Attending: Internal Medicine

## 2016-03-16 ENCOUNTER — Encounter (HOSPITAL_COMMUNITY): Payer: Self-pay | Admitting: Orthopedic Surgery

## 2016-03-16 ENCOUNTER — Inpatient Hospital Stay (HOSPITAL_COMMUNITY): Payer: Medicare Other | Admitting: Anesthesiology

## 2016-03-16 ENCOUNTER — Inpatient Hospital Stay (HOSPITAL_COMMUNITY)
Admission: RE | Admit: 2016-03-16 | Discharge: 2016-03-18 | DRG: 469 | Disposition: A | Discharge status: Skilled Nursing Facility | Payer: Medicare Other | Source: Ambulatory Visit | Attending: Internal Medicine | Admitting: Internal Medicine

## 2016-03-16 ENCOUNTER — Inpatient Hospital Stay (HOSPITAL_COMMUNITY): Admission: RE | Admit: 2016-03-16 | Payer: Medicare Other | Source: Ambulatory Visit

## 2016-03-16 DIAGNOSIS — R1311 Dysphagia, oral phase: Secondary | ICD-10-CM | POA: Diagnosis not present

## 2016-03-16 DIAGNOSIS — Z471 Aftercare following joint replacement surgery: Secondary | ICD-10-CM | POA: Diagnosis not present

## 2016-03-16 DIAGNOSIS — S72144K Nondisplaced intertrochanteric fracture of right femur, subsequent encounter for closed fracture with nonunion: Secondary | ICD-10-CM

## 2016-03-16 DIAGNOSIS — Z7982 Long term (current) use of aspirin: Secondary | ICD-10-CM | POA: Diagnosis not present

## 2016-03-16 DIAGNOSIS — T8489XA Other specified complication of internal orthopedic prosthetic devices, implants and grafts, initial encounter: Secondary | ICD-10-CM | POA: Diagnosis not present

## 2016-03-16 DIAGNOSIS — Z9841 Cataract extraction status, right eye: Secondary | ICD-10-CM | POA: Diagnosis not present

## 2016-03-16 DIAGNOSIS — I10 Essential (primary) hypertension: Secondary | ICD-10-CM | POA: Diagnosis present

## 2016-03-16 DIAGNOSIS — F411 Generalized anxiety disorder: Secondary | ICD-10-CM | POA: Diagnosis present

## 2016-03-16 DIAGNOSIS — E785 Hyperlipidemia, unspecified: Secondary | ICD-10-CM | POA: Diagnosis present

## 2016-03-16 DIAGNOSIS — K219 Gastro-esophageal reflux disease without esophagitis: Secondary | ICD-10-CM

## 2016-03-16 DIAGNOSIS — Z96649 Presence of unspecified artificial hip joint: Secondary | ICD-10-CM

## 2016-03-16 DIAGNOSIS — W1830XA Fall on same level, unspecified, initial encounter: Secondary | ICD-10-CM | POA: Diagnosis present

## 2016-03-16 DIAGNOSIS — T84194A Other mechanical complication of internal fixation device of right femur, initial encounter: Secondary | ICD-10-CM | POA: Diagnosis not present

## 2016-03-16 DIAGNOSIS — M25559 Pain in unspecified hip: Secondary | ICD-10-CM | POA: Diagnosis not present

## 2016-03-16 DIAGNOSIS — Z96641 Presence of right artificial hip joint: Secondary | ICD-10-CM | POA: Diagnosis not present

## 2016-03-16 DIAGNOSIS — Z01818 Encounter for other preprocedural examination: Secondary | ICD-10-CM | POA: Diagnosis not present

## 2016-03-16 DIAGNOSIS — Z8781 Personal history of (healed) traumatic fracture: Secondary | ICD-10-CM

## 2016-03-16 DIAGNOSIS — Z9842 Cataract extraction status, left eye: Secondary | ICD-10-CM | POA: Diagnosis not present

## 2016-03-16 DIAGNOSIS — S7221XA Displaced subtrochanteric fracture of right femur, initial encounter for closed fracture: Secondary | ICD-10-CM | POA: Diagnosis not present

## 2016-03-16 DIAGNOSIS — M25551 Pain in right hip: Secondary | ICD-10-CM | POA: Diagnosis not present

## 2016-03-16 DIAGNOSIS — R2681 Unsteadiness on feet: Secondary | ICD-10-CM | POA: Diagnosis not present

## 2016-03-16 DIAGNOSIS — S72141K Displaced intertrochanteric fracture of right femur, subsequent encounter for closed fracture with nonunion: Secondary | ICD-10-CM | POA: Diagnosis not present

## 2016-03-16 DIAGNOSIS — Z79899 Other long term (current) drug therapy: Secondary | ICD-10-CM | POA: Diagnosis not present

## 2016-03-16 DIAGNOSIS — M169 Osteoarthritis of hip, unspecified: Secondary | ICD-10-CM | POA: Diagnosis not present

## 2016-03-16 DIAGNOSIS — F3177 Bipolar disorder, in partial remission, most recent episode mixed: Secondary | ICD-10-CM

## 2016-03-16 DIAGNOSIS — M9701XA Periprosthetic fracture around internal prosthetic right hip joint, initial encounter: Secondary | ICD-10-CM | POA: Diagnosis not present

## 2016-03-16 DIAGNOSIS — Z96653 Presence of artificial knee joint, bilateral: Secondary | ICD-10-CM | POA: Diagnosis present

## 2016-03-16 DIAGNOSIS — G4700 Insomnia, unspecified: Secondary | ICD-10-CM | POA: Diagnosis present

## 2016-03-16 DIAGNOSIS — I480 Paroxysmal atrial fibrillation: Secondary | ICD-10-CM | POA: Diagnosis not present

## 2016-03-16 DIAGNOSIS — D62 Acute posthemorrhagic anemia: Secondary | ICD-10-CM | POA: Diagnosis not present

## 2016-03-16 DIAGNOSIS — S72009A Fracture of unspecified part of neck of unspecified femur, initial encounter for closed fracture: Secondary | ICD-10-CM | POA: Insufficient documentation

## 2016-03-16 DIAGNOSIS — M84359S Stress fracture, hip, unspecified, sequela: Secondary | ICD-10-CM | POA: Diagnosis not present

## 2016-03-16 DIAGNOSIS — M96661 Fracture of femur following insertion of orthopedic implant, joint prosthesis, or bone plate, right leg: Secondary | ICD-10-CM | POA: Diagnosis present

## 2016-03-16 DIAGNOSIS — Z885 Allergy status to narcotic agent status: Secondary | ICD-10-CM | POA: Diagnosis not present

## 2016-03-16 DIAGNOSIS — F329 Major depressive disorder, single episode, unspecified: Secondary | ICD-10-CM | POA: Diagnosis present

## 2016-03-16 DIAGNOSIS — I4891 Unspecified atrial fibrillation: Secondary | ICD-10-CM | POA: Diagnosis not present

## 2016-03-16 DIAGNOSIS — F319 Bipolar disorder, unspecified: Secondary | ICD-10-CM | POA: Diagnosis present

## 2016-03-16 DIAGNOSIS — T84114A Breakdown (mechanical) of internal fixation device of right femur, initial encounter: Principal | ICD-10-CM | POA: Diagnosis present

## 2016-03-16 DIAGNOSIS — E119 Type 2 diabetes mellitus without complications: Secondary | ICD-10-CM

## 2016-03-16 DIAGNOSIS — Z9889 Other specified postprocedural states: Secondary | ICD-10-CM

## 2016-03-16 DIAGNOSIS — E039 Hypothyroidism, unspecified: Secondary | ICD-10-CM | POA: Diagnosis present

## 2016-03-16 DIAGNOSIS — Z09 Encounter for follow-up examination after completed treatment for conditions other than malignant neoplasm: Secondary | ICD-10-CM

## 2016-03-16 DIAGNOSIS — G8929 Other chronic pain: Secondary | ICD-10-CM | POA: Diagnosis present

## 2016-03-16 DIAGNOSIS — M6281 Muscle weakness (generalized): Secondary | ICD-10-CM | POA: Diagnosis not present

## 2016-03-16 DIAGNOSIS — R262 Difficulty in walking, not elsewhere classified: Secondary | ICD-10-CM | POA: Diagnosis not present

## 2016-03-16 DIAGNOSIS — M9701XD Periprosthetic fracture around internal prosthetic right hip joint, subsequent encounter: Secondary | ICD-10-CM | POA: Diagnosis not present

## 2016-03-16 DIAGNOSIS — S72001A Fracture of unspecified part of neck of right femur, initial encounter for closed fracture: Secondary | ICD-10-CM

## 2016-03-16 DIAGNOSIS — R1313 Dysphagia, pharyngeal phase: Secondary | ICD-10-CM | POA: Diagnosis not present

## 2016-03-16 HISTORY — DX: Periprosthetic fracture around internal prosthetic right hip joint, initial encounter: M97.01XA

## 2016-03-16 HISTORY — PX: TOTAL HIP ARTHROPLASTY: SHX124

## 2016-03-16 HISTORY — PX: HARDWARE REMOVAL: SHX979

## 2016-03-16 LAB — COMPREHENSIVE METABOLIC PANEL
ALBUMIN: 3.6 g/dL (ref 3.5–5.0)
ALT: 19 U/L (ref 14–54)
ANION GAP: 12 (ref 5–15)
AST: 19 U/L (ref 15–41)
Alkaline Phosphatase: 141 U/L — ABNORMAL HIGH (ref 38–126)
BUN: 8 mg/dL (ref 6–20)
CO2: 29 mmol/L (ref 22–32)
Calcium: 9.9 mg/dL (ref 8.9–10.3)
Chloride: 93 mmol/L — ABNORMAL LOW (ref 101–111)
Creatinine, Ser: 0.68 mg/dL (ref 0.44–1.00)
GFR calc non Af Amer: >60 mL/min (ref >60)
GLUCOSE: 109 mg/dL — AB (ref 65–99)
Potassium: 3.6 mmol/L (ref 3.5–5.1)
SODIUM: 134 mmol/L — AB (ref 135–145)
TOTAL PROTEIN: 6.3 g/dL — AB (ref 6.5–8.1)
Total Bilirubin: 1.1 mg/dL (ref 0.3–1.2)

## 2016-03-16 LAB — URINALYSIS, ROUTINE W REFLEX MICROSCOPIC
Bilirubin Urine: NEGATIVE
GLUCOSE, UA: NEGATIVE mg/dL
HGB URINE DIPSTICK: NEGATIVE
Ketones, ur: 15 mg/dL — AB
LEUKOCYTES UA: NEGATIVE
Nitrite: NEGATIVE
Protein, ur: NEGATIVE mg/dL
SPECIFIC GRAVITY, URINE: 1.016 (ref 1.005–1.030)
pH: 7.5 (ref 5.0–8.0)

## 2016-03-16 LAB — GLUCOSE, CAPILLARY
GLUCOSE-CAPILLARY: 93 mg/dL (ref 65–99)
Glucose-Capillary: 127 mg/dL — ABNORMAL HIGH (ref 65–99)

## 2016-03-16 LAB — CBC
HEMATOCRIT: 38.8 % (ref 36.0–46.0)
HEMOGLOBIN: 13.2 g/dL (ref 12.0–15.0)
MCH: 31.3 pg (ref 26.0–34.0)
MCHC: 34 g/dL (ref 30.0–36.0)
MCV: 91.9 fL (ref 78.0–100.0)
Platelets: 274 10*3/uL (ref 150–400)
RBC: 4.22 MIL/uL (ref 3.87–5.11)
RDW: 13.2 % (ref 11.5–15.5)
WBC: 8.2 10*3/uL (ref 4.0–10.5)

## 2016-03-16 LAB — PROTIME-INR
INR: 1.14 (ref 0.00–1.49)
Prothrombin Time: 14.8 seconds (ref 11.6–15.2)

## 2016-03-16 LAB — SURGICAL PCR SCREEN
MRSA, PCR: NEGATIVE
STAPHYLOCOCCUS AUREUS: NEGATIVE

## 2016-03-16 LAB — TSH: TSH: 2.304 u[IU]/mL (ref 0.350–4.500)

## 2016-03-16 LAB — PREPARE RBC (CROSSMATCH)

## 2016-03-16 SURGERY — ARTHROPLASTY, HIP, TOTAL,POSTERIOR APPROACH
Anesthesia: General | Site: Hip | Laterality: Right

## 2016-03-16 MED ORDER — ONDANSETRON HCL 4 MG/2ML IJ SOLN
4.0000 mg | Freq: Four times a day (QID) | INTRAMUSCULAR | Status: DC | PRN
  Administered 2016-03-16 (×2): 4 mg via INTRAVENOUS
  Filled 2016-03-16: qty 2

## 2016-03-16 MED ORDER — TRANEXAMIC ACID 1000 MG/10ML IV SOLN
2000.0000 mg | INTRAVENOUS | Status: DC | PRN
  Administered 2016-03-16: 2000 mg via TOPICAL

## 2016-03-16 MED ORDER — BENAZEPRIL HCL 20 MG PO TABS
40.0000 mg | ORAL_TABLET | Freq: Every evening | ORAL | Status: DC
  Administered 2016-03-17: 40 mg via ORAL
  Filled 2016-03-16: qty 2

## 2016-03-16 MED ORDER — ALBUMIN HUMAN 5 % IV SOLN
INTRAVENOUS | Status: DC | PRN
  Administered 2016-03-16 (×2): via INTRAVENOUS

## 2016-03-16 MED ORDER — 0.9 % SODIUM CHLORIDE (POUR BTL) OPTIME
TOPICAL | Status: DC | PRN
  Administered 2016-03-16: 1000 mL

## 2016-03-16 MED ORDER — CHLORHEXIDINE GLUCONATE 4 % EX LIQD: 60.0000 mL | Freq: Once | CUTANEOUS | Status: DC

## 2016-03-16 MED ORDER — LACTATED RINGERS IV SOLN
INTRAVENOUS | Status: DC | PRN
  Administered 2016-03-16 (×2): via INTRAVENOUS

## 2016-03-16 MED ORDER — TRANEXAMIC ACID 1000 MG/10ML IV SOLN
2000.0000 mg | INTRAVENOUS | Status: DC
  Filled 2016-03-16: qty 20

## 2016-03-16 MED ORDER — ACETAMINOPHEN 160 MG/5ML PO SOLN
325.0000 mg | ORAL | Status: DC | PRN
  Filled 2016-03-16: qty 20.3

## 2016-03-16 MED ORDER — NEOSTIGMINE METHYLSULFATE 10 MG/10ML IV SOLN
INTRAVENOUS | Status: AC
  Filled 2016-03-16: qty 1

## 2016-03-16 MED ORDER — LACTATED RINGERS IV SOLN: INTRAVENOUS | Status: DC

## 2016-03-16 MED ORDER — ACETAMINOPHEN 650 MG RE SUPP: 650.0000 mg | Freq: Four times a day (QID) | RECTAL | Status: DC | PRN

## 2016-03-16 MED ORDER — ONDANSETRON HCL 4 MG PO TABS: 4.0000 mg | ORAL_TABLET | Freq: Four times a day (QID) | ORAL | Status: DC | PRN

## 2016-03-16 MED ORDER — DOCUSATE SODIUM 100 MG PO CAPS
100.0000 mg | ORAL_CAPSULE | Freq: Two times a day (BID) | ORAL | Status: DC
  Administered 2016-03-16 – 2016-03-18 (×4): 100 mg via ORAL
  Filled 2016-03-16 (×4): qty 1

## 2016-03-16 MED ORDER — LIDOCAINE HCL (CARDIAC) 20 MG/ML IV SOLN
INTRAVENOUS | Status: AC
  Filled 2016-03-16: qty 5

## 2016-03-16 MED ORDER — FENTANYL 25 MCG/HR TD PT72
25.0000 ug | MEDICATED_PATCH | TRANSDERMAL | Status: DC
  Administered 2016-03-18: 25 ug via TRANSDERMAL
  Filled 2016-03-16: qty 1

## 2016-03-16 MED ORDER — HYDROCODONE-ACETAMINOPHEN 5-325 MG PO TABS
1.0000 | ORAL_TABLET | Freq: Four times a day (QID) | ORAL | Status: DC | PRN
  Administered 2016-03-16: 1 via ORAL
  Administered 2016-03-17 – 2016-03-18 (×4): 2 via ORAL
  Filled 2016-03-16: qty 1
  Filled 2016-03-16 (×4): qty 2

## 2016-03-16 MED ORDER — HYDROMORPHONE HCL 1 MG/ML IJ SOLN: 0.5000 mg | INTRAMUSCULAR | Status: DC | PRN

## 2016-03-16 MED ORDER — ONDANSETRON HCL 4 MG/2ML IJ SOLN
INTRAMUSCULAR | Status: AC
  Filled 2016-03-16: qty 2

## 2016-03-16 MED ORDER — TRANEXAMIC ACID 1000 MG/10ML IV SOLN
1000.0000 mg | INTRAVENOUS | Status: AC
  Administered 2016-03-16: 1000 mg via INTRAVENOUS
  Filled 2016-03-16: qty 10

## 2016-03-16 MED ORDER — PROPOFOL 10 MG/ML IV BOLUS
INTRAVENOUS | Status: AC
  Filled 2016-03-16: qty 20

## 2016-03-16 MED ORDER — ONDANSETRON HCL 4 MG/2ML IJ SOLN
4.0000 mg | Freq: Four times a day (QID) | INTRAMUSCULAR | Status: DC | PRN
  Administered 2016-03-16 – 2016-03-17 (×3): 4 mg via INTRAVENOUS
  Filled 2016-03-16 (×3): qty 2

## 2016-03-16 MED ORDER — ACETAMINOPHEN 325 MG PO TABS: 650.0000 mg | ORAL_TABLET | Freq: Four times a day (QID) | ORAL | Status: DC | PRN

## 2016-03-16 MED ORDER — POLYETHYLENE GLYCOL 3350 17 G PO PACK: 17.0000 g | PACK | Freq: Every day | ORAL | Status: DC | PRN

## 2016-03-16 MED ORDER — TEMAZEPAM 15 MG PO CAPS
30.0000 mg | ORAL_CAPSULE | Freq: Every evening | ORAL | Status: DC | PRN
  Administered 2016-03-16 – 2016-03-17 (×2): 30 mg via ORAL
  Filled 2016-03-16 (×2): qty 2

## 2016-03-16 MED ORDER — OXYCODONE HCL 5 MG PO TABS: 5.0000 mg | ORAL_TABLET | Freq: Once | ORAL | Status: DC | PRN

## 2016-03-16 MED ORDER — BUPIVACAINE HCL (PF) 0.5 % IJ SOLN
INTRAMUSCULAR | Status: AC
  Filled 2016-03-16: qty 30

## 2016-03-16 MED ORDER — POLYETHYLENE GLYCOL 3350 17 G PO PACK
17.0000 g | PACK | ORAL | Status: DC
  Administered 2016-03-18: 17 g via ORAL
  Filled 2016-03-16: qty 1

## 2016-03-16 MED ORDER — ASPIRIN EC 81 MG PO TBEC: 81.0000 mg | DELAYED_RELEASE_TABLET | Freq: Every day | ORAL | Status: DC

## 2016-03-16 MED ORDER — POVIDONE-IODINE 10 % EX SWAB: 2.0000 "application " | Freq: Once | CUTANEOUS | Status: DC

## 2016-03-16 MED ORDER — FLUOXETINE HCL 20 MG PO CAPS: 20.0000 mg | ORAL_CAPSULE | Freq: Every day | ORAL | Status: DC

## 2016-03-16 MED ORDER — ROCURONIUM BROMIDE 50 MG/5ML IV SOLN
INTRAVENOUS | Status: AC
  Filled 2016-03-16: qty 1

## 2016-03-16 MED ORDER — PHENOL 1.4 % MT LIQD: 1.0000 | OROMUCOSAL | Status: DC | PRN

## 2016-03-16 MED ORDER — CEFAZOLIN SODIUM-DEXTROSE 2-4 GM/100ML-% IV SOLN
2.0000 g | INTRAVENOUS | Status: AC
  Administered 2016-03-16: 2 g via INTRAVENOUS

## 2016-03-16 MED ORDER — ALUM & MAG HYDROXIDE-SIMETH 200-200-20 MG/5ML PO SUSP: 30.0000 mL | ORAL | Status: DC | PRN

## 2016-03-16 MED ORDER — CHLORTHALIDONE 25 MG PO TABS
25.0000 mg | ORAL_TABLET | Freq: Every day | ORAL | Status: DC
  Administered 2016-03-17 – 2016-03-18 (×2): 25 mg via ORAL
  Filled 2016-03-16 (×2): qty 1

## 2016-03-16 MED ORDER — CEFAZOLIN SODIUM-DEXTROSE 2-4 GM/100ML-% IV SOLN
2.0000 g | Freq: Four times a day (QID) | INTRAVENOUS | Status: AC
Start: 2016-03-16 — End: 2016-03-17
  Administered 2016-03-16 – 2016-03-17 (×2): 2 g via INTRAVENOUS
  Filled 2016-03-16 (×2): qty 100

## 2016-03-16 MED ORDER — SUGAMMADEX SODIUM 500 MG/5ML IV SOLN
INTRAVENOUS | Status: AC
  Filled 2016-03-16: qty 5

## 2016-03-16 MED ORDER — CHLORHEXIDINE GLUCONATE 4 % EX LIQD
60.0000 mL | Freq: Once | CUTANEOUS | Status: DC
  Filled 2016-03-16: qty 60

## 2016-03-16 MED ORDER — EPHEDRINE SULFATE 50 MG/ML IJ SOLN
INTRAMUSCULAR | Status: DC | PRN
  Administered 2016-03-16 (×2): 10 mg via INTRAVENOUS
  Administered 2016-03-16 (×2): 5 mg via INTRAVENOUS

## 2016-03-16 MED ORDER — CEFAZOLIN SODIUM-DEXTROSE 2-4 GM/100ML-% IV SOLN: 2.0000 g | INTRAVENOUS | Status: DC

## 2016-03-16 MED ORDER — SODIUM CHLORIDE 0.9 % IV SOLN: 10.0000 mL/h | Freq: Once | INTRAVENOUS | Status: DC

## 2016-03-16 MED ORDER — MORPHINE SULFATE (PF) 2 MG/ML IV SOLN: 1.0000 mg | INTRAVENOUS | Status: DC | PRN

## 2016-03-16 MED ORDER — SUGAMMADEX SODIUM 500 MG/5ML IV SOLN: INTRAVENOUS | Status: DC | PRN

## 2016-03-16 MED ORDER — APIXABAN 2.5 MG PO TABS
2.5000 mg | ORAL_TABLET | Freq: Two times a day (BID) | ORAL | Status: DC
  Administered 2016-03-16 – 2016-03-18 (×4): 2.5 mg via ORAL
  Filled 2016-03-16 (×4): qty 1

## 2016-03-16 MED ORDER — FERROUS SULFATE 325 (65 FE) MG PO TABS
325.0000 mg | ORAL_TABLET | Freq: Two times a day (BID) | ORAL | Status: DC
  Administered 2016-03-17 – 2016-03-18 (×2): 325 mg via ORAL
  Filled 2016-03-16 (×2): qty 1

## 2016-03-16 MED ORDER — METOPROLOL TARTRATE 50 MG PO TABS
50.0000 mg | ORAL_TABLET | Freq: Two times a day (BID) | ORAL | Status: DC
  Administered 2016-03-16 – 2016-03-18 (×5): 50 mg via ORAL
  Filled 2016-03-16 (×5): qty 1

## 2016-03-16 MED ORDER — LIDOCAINE HCL (CARDIAC) 20 MG/ML IV SOLN
INTRAVENOUS | Status: DC | PRN
  Administered 2016-03-16: 40 mg via INTRAVENOUS

## 2016-03-16 MED ORDER — SODIUM CHLORIDE 0.9% FLUSH
3.0000 mL | Freq: Two times a day (BID) | INTRAVENOUS | Status: DC
  Administered 2016-03-16 – 2016-03-17 (×3): 3 mL via INTRAVENOUS

## 2016-03-16 MED ORDER — PROPOFOL 10 MG/ML IV BOLUS
INTRAVENOUS | Status: DC | PRN
  Administered 2016-03-16: 30 mg via INTRAVENOUS
  Administered 2016-03-16: 100 mg via INTRAVENOUS

## 2016-03-16 MED ORDER — OXYCODONE-ACETAMINOPHEN 5-325 MG PO TABS: 1.0000 | ORAL_TABLET | Freq: Four times a day (QID) | ORAL | Status: DC | PRN

## 2016-03-16 MED ORDER — LEVOTHYROXINE SODIUM 25 MCG PO TABS
125.0000 ug | ORAL_TABLET | Freq: Every day | ORAL | Status: DC
  Administered 2016-03-17 – 2016-03-18 (×2): 125 ug via ORAL
  Filled 2016-03-16 (×2): qty 1

## 2016-03-16 MED ORDER — FENTANYL CITRATE (PF) 100 MCG/2ML IJ SOLN
INTRAMUSCULAR | Status: AC
  Filled 2016-03-16: qty 2

## 2016-03-16 MED ORDER — SODIUM CHLORIDE 0.9 % IV SOLN
INTRAVENOUS | Status: DC
  Administered 2016-03-16: 11:00:00 via INTRAVENOUS

## 2016-03-16 MED ORDER — ACETAMINOPHEN 650 MG RE SUPP
650.0000 mg | Freq: Four times a day (QID) | RECTAL | Status: DC | PRN
Start: 2016-03-16 — End: 2016-03-16

## 2016-03-16 MED ORDER — SENNOSIDES-DOCUSATE SODIUM 8.6-50 MG PO TABS: 1.0000 | ORAL_TABLET | Freq: Every evening | ORAL | Status: DC | PRN

## 2016-03-16 MED ORDER — MORPHINE SULFATE (PF) 2 MG/ML IV SOLN
2.0000 mg | INTRAVENOUS | Status: DC | PRN
  Administered 2016-03-17: 2 mg via INTRAVENOUS
  Filled 2016-03-16: qty 1

## 2016-03-16 MED ORDER — OXYCODONE HCL 5 MG/5ML PO SOLN: 5.0000 mg | Freq: Once | ORAL | Status: DC | PRN

## 2016-03-16 MED ORDER — FENTANYL CITRATE (PF) 250 MCG/5ML IJ SOLN
INTRAMUSCULAR | Status: AC
  Filled 2016-03-16: qty 5

## 2016-03-16 MED ORDER — MAGNESIUM CITRATE PO SOLN: 1.0000 | Freq: Once | ORAL | Status: DC | PRN

## 2016-03-16 MED ORDER — FLUOXETINE HCL 20 MG PO CAPS
40.0000 mg | ORAL_CAPSULE | Freq: Every day | ORAL | Status: DC
  Administered 2016-03-17 – 2016-03-18 (×2): 40 mg via ORAL
  Filled 2016-03-16 (×2): qty 2

## 2016-03-16 MED ORDER — CEFAZOLIN SODIUM-DEXTROSE 2-4 GM/100ML-% IV SOLN
INTRAVENOUS | Status: AC
  Filled 2016-03-16: qty 100

## 2016-03-16 MED ORDER — MENTHOL 3 MG MT LOZG: 1.0000 | LOZENGE | OROMUCOSAL | Status: DC | PRN

## 2016-03-16 MED ORDER — BUPIVACAINE HCL (PF) 0.25 % IJ SOLN
INTRAMUSCULAR | Status: AC
  Filled 2016-03-16: qty 30

## 2016-03-16 MED ORDER — PHENYLEPHRINE HCL 10 MG/ML IJ SOLN
INTRAMUSCULAR | Status: DC | PRN
  Administered 2016-03-16: 80 ug via INTRAVENOUS
  Administered 2016-03-16 (×2): 40 ug via INTRAVENOUS
  Administered 2016-03-16 (×4): 80 ug via INTRAVENOUS

## 2016-03-16 MED ORDER — FENTANYL CITRATE (PF) 100 MCG/2ML IJ SOLN
25.0000 ug | INTRAMUSCULAR | Status: DC | PRN
  Administered 2016-03-16 (×3): 25 ug via INTRAVENOUS

## 2016-03-16 MED ORDER — FENTANYL CITRATE (PF) 100 MCG/2ML IJ SOLN
INTRAMUSCULAR | Status: DC | PRN
  Administered 2016-03-16 (×3): 50 ug via INTRAVENOUS
  Administered 2016-03-16: 100 ug via INTRAVENOUS
  Administered 2016-03-16 (×2): 50 ug via INTRAVENOUS

## 2016-03-16 MED ORDER — PANTOPRAZOLE SODIUM 40 MG PO TBEC
80.0000 mg | DELAYED_RELEASE_TABLET | Freq: Every day | ORAL | Status: DC
Start: 2016-03-17 — End: 2016-03-18
  Administered 2016-03-17 – 2016-03-18 (×2): 80 mg via ORAL
  Filled 2016-03-16 (×2): qty 2

## 2016-03-16 MED ORDER — VECURONIUM BROMIDE 10 MG IV SOLR
INTRAVENOUS | Status: DC | PRN
  Administered 2016-03-16 (×2): 2 mg via INTRAVENOUS

## 2016-03-16 MED ORDER — SENNA 8.6 MG PO TABS
1.0000 | ORAL_TABLET | Freq: Two times a day (BID) | ORAL | Status: DC
  Administered 2016-03-16 – 2016-03-17 (×3): 8.6 mg via ORAL
  Filled 2016-03-16 (×4): qty 1

## 2016-03-16 MED ORDER — ROCURONIUM BROMIDE 100 MG/10ML IV SOLN
INTRAVENOUS | Status: DC | PRN
Start: 2016-03-16 — End: 2016-03-16
  Administered 2016-03-16: 30 mg via INTRAVENOUS
  Administered 2016-03-16: 20 mg via INTRAVENOUS

## 2016-03-16 MED ORDER — ACETAMINOPHEN 325 MG PO TABS: 325.0000 mg | ORAL_TABLET | ORAL | Status: DC | PRN

## 2016-03-16 MED ORDER — INSULIN ASPART 100 UNIT/ML ~~LOC~~ SOLN
0.0000 [IU] | Freq: Three times a day (TID) | SUBCUTANEOUS | Status: DC
  Administered 2016-03-17 – 2016-03-18 (×4): 1 [IU] via SUBCUTANEOUS

## 2016-03-16 MED ORDER — SUGAMMADEX SODIUM 500 MG/5ML IV SOLN
INTRAVENOUS | Status: DC | PRN
  Administered 2016-03-16: 118 mg via INTRAVENOUS

## 2016-03-16 MED ORDER — SODIUM CHLORIDE 0.9 % IV SOLN
75.0000 mL/h | INTRAVENOUS | Status: DC
  Administered 2016-03-16: 75 mL/h via INTRAVENOUS

## 2016-03-16 MED ORDER — BISACODYL 10 MG RE SUPP: 10.0000 mg | Freq: Every day | RECTAL | Status: DC | PRN

## 2016-03-16 MED ORDER — GLYCOPYRROLATE 0.2 MG/ML IJ SOLN
INTRAMUSCULAR | Status: AC
  Filled 2016-03-16: qty 4

## 2016-03-16 SURGICAL SUPPLY — 91 items
BAG DECANTER FOR FLEXI CONT (MISCELLANEOUS) IMPLANT
BANDAGE ELASTIC 6 VELCRO ST LF (GAUZE/BANDAGES/DRESSINGS) IMPLANT
BANDAGE ESMARK 6X9 LF (GAUZE/BANDAGES/DRESSINGS) IMPLANT
BIT DRILL 5/64X5 DISP (BIT) ×3 IMPLANT
BLADE SAW SAG 73X25 THK (BLADE) ×2
BLADE SAW SGTL 73X25 THK (BLADE) ×1 IMPLANT
BLADE SURG 15 STRL LF DISP TIS (BLADE) ×1 IMPLANT
BLADE SURG 15 STRL SS (BLADE) ×3
BNDG CMPR 9X6 STRL LF SNTH (GAUZE/BANDAGES/DRESSINGS)
BNDG ESMARK 6X9 LF (GAUZE/BANDAGES/DRESSINGS)
BRUSH FEMORAL CANAL (MISCELLANEOUS) IMPLANT
CANISTER SUCTION 1200CC (MISCELLANEOUS) ×3 IMPLANT
CAPT HIP HEMI 2 ×2 IMPLANT
CLOSURE STERI-STRIP 1/2X4 (GAUZE/BANDAGES/DRESSINGS) ×2
CLSR STERI-STRIP ANTIMIC 1/2X4 (GAUZE/BANDAGES/DRESSINGS) ×4 IMPLANT
CONT SPEC 4OZ CLIKSEAL STRL BL (MISCELLANEOUS) ×4 IMPLANT
COVER SURGICAL LIGHT HANDLE (MISCELLANEOUS) ×3 IMPLANT
CUFF TOURNIQUET SINGLE 18IN (TOURNIQUET CUFF) IMPLANT
CUFF TOURNIQUET SINGLE 34IN LL (TOURNIQUET CUFF) IMPLANT
DRAPE INCISE IOBAN 66X45 STRL (DRAPES) ×6 IMPLANT
DRAPE ORTHO SPLIT 77X108 STRL (DRAPES) ×6
DRAPE PROXIMA HALF (DRAPES) ×3 IMPLANT
DRAPE SURG ORHT 6 SPLT 77X108 (DRAPES) ×2 IMPLANT
DRAPE U-SHAPE 47X51 STRL (DRAPES) ×3 IMPLANT
DRSG MEPILEX BORDER 4X12 (GAUZE/BANDAGES/DRESSINGS) ×2 IMPLANT
DRSG MEPILEX BORDER 4X8 (GAUZE/BANDAGES/DRESSINGS) IMPLANT
DURAPREP 26ML APPLICATOR (WOUND CARE) ×3 IMPLANT
ELECT CAUTERY BLADE 6.4 (BLADE) ×3 IMPLANT
ELECT REM PT RETURN 9FT ADLT (ELECTROSURGICAL) ×3
ELECTRODE REM PT RTRN 9FT ADLT (ELECTROSURGICAL) ×1 IMPLANT
EXTRACTION HOOK FOR TI CANNULATED NAILS ×2 IMPLANT
GAUZE SPONGE 4X4 12PLY STRL (GAUZE/BANDAGES/DRESSINGS) IMPLANT
GAUZE XEROFORM 1X8 LF (GAUZE/BANDAGES/DRESSINGS) IMPLANT
GLOVE BIOGEL PI IND STRL 8 (GLOVE) ×1 IMPLANT
GLOVE BIOGEL PI INDICATOR 8 (GLOVE) ×6
GLOVE BIOGEL PI ORTHO PRO SZ8 (GLOVE) ×4
GLOVE ORTHO TXT STRL SZ7.5 (GLOVE) ×7 IMPLANT
GLOVE PI ORTHO PRO STRL SZ8 (GLOVE) ×2 IMPLANT
GLOVE SURG ORTHO 8.0 STRL STRW (GLOVE) ×3 IMPLANT
GOWN STRL REUS W/ TWL XL LVL3 (GOWN DISPOSABLE) ×1 IMPLANT
GOWN STRL REUS W/TWL 2XL LVL3 (GOWN DISPOSABLE) ×3 IMPLANT
GOWN STRL REUS W/TWL XL LVL3 (GOWN DISPOSABLE) ×3
HANDPIECE INTERPULSE COAX TIP (DISPOSABLE)
HOOD PEEL AWAY FACE SHEILD DIS (HOOD) ×6 IMPLANT
HOOK EXTRACTION FOR CANN NAIL (Orthopedic Implant) ×2 IMPLANT
IMMOBILIZER KNEE 24 THIGH 36 (MISCELLANEOUS) ×1 IMPLANT
IMMOBILIZER KNEE 24 UNIV (MISCELLANEOUS) ×3
KIT BASIN OR (CUSTOM PROCEDURE TRAY) ×3 IMPLANT
KIT ROOM TURNOVER OR (KITS) ×3 IMPLANT
MANIFOLD NEPTUNE II (INSTRUMENTS) ×3 IMPLANT
NDL SAFETY ECLIPSE 18X1.5 (NEEDLE) ×1 IMPLANT
NDL SUT .5 MAYO 1.404X.05X (NEEDLE) ×1 IMPLANT
NDL SUT 6 .5 CRC .975X.05 MAYO (NEEDLE) IMPLANT
NEEDLE HYPO 18GX1.5 SHARP (NEEDLE) ×3
NEEDLE MAYO TAPER (NEEDLE) ×3
NS IRRIG 1000ML POUR BTL (IV SOLUTION) ×3 IMPLANT
PACK GENERAL/GYN (CUSTOM PROCEDURE TRAY) ×3 IMPLANT
PACK TOTAL JOINT (CUSTOM PROCEDURE TRAY) ×3 IMPLANT
PAD ARMBOARD 7.5X6 YLW CONV (MISCELLANEOUS) ×6 IMPLANT
PADDING CAST COTTON 6X4 STRL (CAST SUPPLIES) IMPLANT
PILLOW ABDUCTION HIP (SOFTGOODS) ×3 IMPLANT
PRESSURIZER FEMORAL UNIV (MISCELLANEOUS) IMPLANT
RETRIEVER SUT HEWSON (MISCELLANEOUS) ×3 IMPLANT
SET HNDPC FAN SPRY TIP SCT (DISPOSABLE) IMPLANT
SHEET MEDIUM DRAPE 40X70 STRL (DRAPES) ×3 IMPLANT
SPONGE LAP 18X18 X RAY DECT (DISPOSABLE) ×8 IMPLANT
SPONGE LAP 4X18 X RAY DECT (DISPOSABLE) ×3 IMPLANT
STAPLER VISISTAT (STAPLE) ×2 IMPLANT
STAPLER VISISTAT 35W (STAPLE) IMPLANT
SUCTION FRAZIER HANDLE 10FR (MISCELLANEOUS) ×2
SUCTION TUBE FRAZIER 10FR DISP (MISCELLANEOUS) ×1 IMPLANT
SUT FIBERWIRE #2 38 REV NDL BL (SUTURE) ×9
SUT MNCRL AB 4-0 PS2 18 (SUTURE) ×3 IMPLANT
SUT VIC AB 0 CT1 27 (SUTURE) ×3
SUT VIC AB 0 CT1 27XBRD ANBCTR (SUTURE) ×1 IMPLANT
SUT VIC AB 0 SH 27 (SUTURE) IMPLANT
SUT VIC AB 2-0 CT1 27 (SUTURE) ×3
SUT VIC AB 2-0 CT1 TAPERPNT 27 (SUTURE) ×1 IMPLANT
SUT VIC AB 2-0 SH 27 (SUTURE)
SUT VIC AB 2-0 SH 27XBRD (SUTURE) IMPLANT
SUT VIC AB 3-0 SH 8-18 (SUTURE) ×3 IMPLANT
SUT VICRYL 3-0 CR8 SH (SUTURE) ×3 IMPLANT
SUTURE FIBERWR#2 38 REV NDL BL (SUTURE) ×3 IMPLANT
SYR BULB 3OZ (MISCELLANEOUS) ×3 IMPLANT
SYR CONTROL 10ML LL (SYRINGE) ×3 IMPLANT
TOWEL OR 17X24 6PK STRL BLUE (TOWEL DISPOSABLE) ×3 IMPLANT
TOWEL OR 17X26 10 PK STRL BLUE (TOWEL DISPOSABLE) ×3 IMPLANT
TOWER CARTRIDGE SMART MIX (DISPOSABLE) IMPLANT
TRAY FOLEY CATH 14FR (SET/KITS/TRAYS/PACK) ×2 IMPLANT
UNDERPAD 30X30 INCONTINENT (UNDERPADS AND DIAPERS) ×3 IMPLANT
WATER STERILE IRR 1000ML POUR (IV SOLUTION) ×6 IMPLANT

## 2016-03-16 NOTE — Transfer of Care (Signed)
Immediate Anesthesia Transfer of Care Note  Patient: Leah Holland  Procedure(s) Performed: Procedure(s): TOTAL HIP ARTHROPLASTY (Right) HARDWARE REMOVAL (Right)  Patient Location: PACU  Anesthesia Type:General  Level of Consciousness: awake, alert , oriented and patient cooperative  Airway & Oxygen Therapy: Patient Spontanous Breathing and Patient connected to nasal cannula oxygen  Post-op Assessment: Report given to RN and Post -op Vital signs reviewed and stable  Post vital signs: Reviewed and stable  Last Vitals:  Filed Vitals:   03/16/16 1250 03/16/16 1330  BP: 188/95 198/78  Pulse: 79 72  Temp:  36.8 C  Resp:  12    Complications: No apparent anesthesia complications

## 2016-03-16 NOTE — Anesthesia Postprocedure Evaluation (Signed)
Anesthesia Post Note  Patient: Leah Holland  Procedure(s) Performed: Procedure(s) (LRB): TOTAL HIP ARTHROPLASTY (Right) HARDWARE REMOVAL (Right)  Patient location during evaluation: PACU Anesthesia Type: General Level of consciousness: awake and alert Pain management: pain level controlled Vital Signs Assessment: post-procedure vital signs reviewed and stable Respiratory status: spontaneous breathing, nonlabored ventilation and respiratory function stable Cardiovascular status: blood pressure returned to baseline and stable Postop Assessment: no signs of nausea or vomiting Anesthetic complications: no    Last Vitals:  Filed Vitals:   03/16/16 1906 03/16/16 2013  BP: 133/66 98/53  Pulse: 88 97  Temp: 36.7 C 36.6 C  Resp: 16 16    Last Pain:  Filed Vitals:   03/16/16 2014  PainSc: 0-No pain                 [name] A

## 2016-03-16 NOTE — H&P (Signed)
[facility] History and Physical  Leah Holland [id number] DOB: Aug 23, 1922 DOA: 03/16/2016  Referring physician: [name] PCP: Anthoney Harada, MD   Chief Complaint: right hip pain  HPI: Leah Holland is a delightful 82 y.o. female with past medical history that includes, attention, hyperlipidemia, PAF on Eliquis, diabetes, bipolar presents to room 20 on 5 N. chief complaint of right hip pain. She is being admitted for right hip replacement status post hip fracture earlier this year and more recent fall altering and broken hardware.  Patient remembers mechanical fall when the heel of her shoe got caught in a crack in the sidewalk. She fell immediately did not hit her head did not lose consciousness. She immediately realized right hip was worse. EMS was called and she was transported to the emergency department. He describes the pain as sharp constant worse with movement located in her right hip and right lower back. Ports inability to bear weight at all. She denies any chest pain palpitations headache dizziness syncope or near-syncope. She denies any abdominal pain nausea vomiting diarrhea constipation. She denies any dysuria hematuria frequency or urgency.  On admission she's afebrile hemodynamically stable and not hypoxic. She is evaluated by orthopedics who recommend surgical intervention with removal of the broken nail and hip replacement. Of note originally she had elective surgery already scheduled so she was seen by cardiology last week and was cleared for surgery.  Review of Systems:  10 point review of systems complete and all systems are negative except as indicated in the history of present illness Past Medical History  Diagnosis Date  . Complication of anesthesia   . PONV (postoperative nausea and vomiting)   . Hypertension   . Hyperlipidemia   . Swelling of extremity     sees Dr. Gwenlyn Found, cardiologist 1 x year [phone]  . Bronchitis 12/2011  . Hypothyroidism      takes synthroid  . GERD (gastroesophageal reflux disease)   . Neuromuscular disorder ([place])     carpal tunnel in right hand  . Insomnia   . Hyperlipidemia   . Skin cancer of face 2000's    "one on each side of my face and one on my nose" (08/13/2013)  . Diabetes mellitus type 2, diet-controlled ([place])   . History of blood transfusion     "w/both knee OR's" (08/13/2013)  . H/O hiatal hernia   . Arthritis     "probably" (08/13/2013)  . Atrial fibrillation ([place])     new onset/pt report 08/13/2013  . Depression   . Anxiety attack     "take RX prn" (08/13/2013)  . Intertrochanteric fracture of right hip ([place]) 07/30/2015  . Periprosthetic fracture around internal prosthetic right hip joint ([place]), nonunion basicervical femoral neck with broken hardware and subtrochanteric fracture 03/16/2016   Past Surgical History  Procedure Laterality Date  . Total knee arthroplasty Right 2005  . Carpal tunnel release Right ~ 2005  . Eye surgery    . Breast biopsy Left ?1970's  . Cardiovascular stress test  12/07/11  . Doppler echocardiography  12/07/11  . Total knee arthroplasty  03/02/2012    Procedure: TOTAL KNEE ARTHROPLASTY;  Surgeon: Yvette Rack., MD;  Location: [facility];  Service: Orthopedics;  Laterality: Left;  . Joint replacement      bilateral knee replacement  . Cataract extraction, bilateral Bilateral 2000  . Skin cancer excision  2000's    "off my nose" (08/13/2013)  . Dilation and curettage of uterus  1950's  .  Cardiovascular stress test  12/07/2011    LEXISCAN, normal scan, no significant wall abnormalities noted  . 2d echocardiogram  12/07/2004    EF 48%, moderate pulmonary hypertension  . Femur im nail Right 08/01/2015    Procedure: INTRAMEDULLARY (IM) NAIL FEMORAL;  Surgeon: Marchia Bond, MD;  Location: [facility];  Service: Orthopedics;  Laterality: Right;   Social History:  reports that she has never smoked. She has never used smokeless tobacco. She reports that she does not drink alcohol  or use illicit drugs. She lives at [employer] alone her husband died in 03/27/2004. She's quite independent with ADLs she still drives Allergies  Allergen Reactions  . Morphine And Related Other (See Comments)    Made pt "wild"    Family History  Problem Relation Age of Onset  . Arrhythmia Mother   . Heart disease Mother   . Arrhythmia Sister   . Arrhythmia Brother   . Heart disease Brother   . Stroke Father 28  . Heart disease Brother   . Heart disease Brother   . Cancer Brother     Colon cancer  . Heart disease Sister   . Heart disease Sister   . Stroke Sister   . Diabetes Sister   . Heart disease Child 58  . Heart disease Child 43     Prior to Admission medications   Medication Sig Start Date End Date Taking? Authorizing Provider  acetaminophen (TYLENOL) 325 MG tablet Take 650 mg by mouth every 6 (six) hours as needed for pain.    Historical Provider, MD  apixaban (ELIQUIS) 2.5 MG TABS tablet Take 1 tablet (2.5 mg total) by mouth 2 (two) times daily. 08/01/15   Marchia Bond, MD  aspirin EC 81 MG tablet Take 81 mg by mouth daily.    Historical Provider, MD  atorvastatin (LIPITOR) 80 MG tablet Take 80 mg by mouth daily.    Historical Provider, MD  benazepril (LOTENSIN) 40 MG tablet Take 1 tablet (40 mg total) by mouth every evening. 08/06/15   Thurnell Lose, MD  chlorthalidone (HYGROTON) 25 MG tablet TAKE 1 TABLET ONCE DAILY. 03/30/15   Lorretta Harp, MD  diltiazem (CARDIZEM CD) 180 MG 24 hr capsule Take 1 capsule (180 mg total) by mouth daily. NEED OV. 02/09/16   Lorretta Harp, MD  esomeprazole (NEXIUM) 40 MG capsule Take 1 capsule (40 mg total) by mouth daily at 12 noon. 08/03/15   Thurnell Lose, MD  ferrous sulfate 325 (65 FE) MG tablet Take 1 tablet (325 mg total) by mouth 2 (two) times daily with a meal. 08/03/15   Thurnell Lose, MD  Flaxseed, Linseed, (FLAXSEED OIL) 1000 MG CAPS 1 pill po bid 08/03/15   Thurnell Lose, MD  FLUoxetine (PROZAC) 20 MG  capsule Take 1 capsule by mouth daily. 01/12/15   Historical Provider, MD  FLUoxetine (PROZAC) 40 MG capsule Take 1 capsule by mouth daily. 03/03/16   Historical Provider, MD  furosemide (LASIX) 20 MG tablet Take 1 tablet (20 mg total) by mouth as needed for edema. Patient taking differently: Take 20 mg by mouth as needed for edema (gain 3LBS or more).  08/14/13   Brett Canales, PA-C  HYDROcodone-acetaminophen (NORCO) 5-325 MG per tablet Take one to two tablets by mouth every 6 hours as needed for moderate pain 08/21/15   Tiffany L Reed, DO  metoprolol (LOPRESSOR) 50 MG tablet Take 50 mg by mouth 2 (two) times daily.  Historical Provider, MD  Multiple Vitamins-Minerals (MACULAR VITAMIN BENEFIT) TABS 1 pill by mouth twice a day. There is no dose for multivitamin. 08/03/15   Thurnell Lose, MD  oxyCODONE-acetaminophen (PERCOCET/ROXICET) 5-325 MG tablet Take 1 tablet by mouth every 6 (six) hours as needed. 03/04/16   Historical Provider, MD  Polyethyl Glycol-Propyl Glycol (SYSTANE) 0.4-0.3 % SOLN Apply 1 drop to eye 3 (three) times daily as needed (dry eye).    Historical Provider, MD  potassium chloride (K-DUR) 10 MEQ tablet Take 1 tablet (10 mEq total) by mouth as needed. Patient taking differently: Take 10 mEq by mouth as needed (when taking lasix).  08/14/13   Brett Canales, PA-C  PROBIOTIC CAPS Take 1 capsule by mouth daily as needed (diarrhea).     Historical Provider, MD  sennosides-docusate sodium (SENOKOT-S) 8.6-50 MG tablet Take 2 tablets by mouth daily. 08/01/15   Marchia Bond, MD  SYNTHROID 125 MCG tablet TAKE 1 TABLET ONCE DAILY BEFORE BREAKFAST. 02/29/16   Gildardo Cranker, DO  temazepam (RESTORIL) 30 MG capsule Take 1 capsule (30 mg total) by mouth at bedtime as needed for sleep. 08/03/15   Thurnell Lose, MD   Physical Exam: Filed Vitals:   03/16/16 0700  BP: 204/64  Pulse: 65  Temp: 97.9 F (36.6 C)  TempSrc: Oral  SpO2: 95%    Wt Readings from Last 3 Encounters:  03/08/16 58.968 kg  (130 lb)  08/06/15 62.596 kg (138 lb)  08/03/15 65.93 kg (145 lb 5.6 oz)    General:  Appears calm and comfortable Eyes: PERRL, normal lids, irises & conjunctiva ENT: grossly normal hearing, lips & tongue Neck: no LAD, masses or thyromegaly Cardiovascular: RRR, no m/r/g. Trace LE edema.  Respiratory: CTA bilaterally, no w/r/r. Normal respiratory effort. Abdomen: soft, ntnd is a bowel sounds Skin: no rash or induration seen on limited exam Musculoskeletal: grossly normal tone BUE/BLE, right leg shorter and rotated. Pedal pulses present Psychiatric: grossly normal mood and affect, speech fluent and appropriate Neurologic: grossly non-focal. Speech clear facial symmetry alert and oriented 3           Labs on Admission:  Basic Metabolic Panel: No results for input(s): NA, K, CL, CO2, GLUCOSE, BUN, CREATININE, CALCIUM, MG, PHOS in the last 168 hours. Liver Function Tests: No results for input(s): AST, ALT, ALKPHOS, BILITOT, PROT, ALBUMIN in the last 168 hours. No results for input(s): LIPASE, AMYLASE in the last 168 hours. No results for input(s): AMMONIA in the last 168 hours. CBC: No results for input(s): WBC, NEUTROABS, HGB, HCT, MCV, PLT in the last 168 hours. Cardiac Enzymes: No results for input(s): CKTOTAL, CKMB, CKMBINDEX, TROPONINI in the last 168 hours.  BNP (last 3 results) No results for input(s): BNP in the last 8760 hours.  ProBNP (last 3 results) No results for input(s): PROBNP in the last 8760 hours.  CBG: No results for input(s): GLUCAP in the last 168 hours.  Radiological Exams on Admission: No results found.  EKG: Pending on admission  Assessment/Plan Principal Problem:   Periprosthetic fracture around internal prosthetic right hip joint ([place]), nonunion basicervical femoral neck with broken hardware and subtrochanteric fracture Active Problems:   Hypertension   Hyperlipidemia   Hypothyroidism   GERD (gastroesophageal reflux disease)   Paroxysmal  atrial fibrillation ([place])   Diabetes mellitus type 2, diet-controlled ([place])   Bipolar disorder ([place])   Hip fx ([place])  #1. Periprosthetic fracture around internal prosthetic right hip joint nonunion basicervical femoral neck with broken hardware secondary  to mechanical fall. -Scheduled for surgery today per orthopedics -Pain control -We'll obtain comprehensive metabolic panel, CBC, INR/ PT, EKG and chest xray -Hold home medications until postop  #2. Hypertension. Fair control. Home medications include benazapril, diltiazem Lasix and metoprolol -plan to resume home meds postop as indicated -When necessary hydralazine preop  #3. Hypothyroidism. -Obtain a TSH -We will resume Synthroid  #4 paroxysmal A. fib. On Eliquis. EKG done on March 28 with sinus rhythm and occasional pvc -Repeat EKG -Hold Eliquis for now  5. Diabetes. Diet controlled. -Obtain hemoglobin A1c -monitor her CBGs  #6. Bipolar. Appears stable at baseline. -Monitor  7. GERD. Stable at baseline  [place]   Code Status: full DVT Prophylaxis: Family Communication: none present Disposition Plan: back to facility  Time spent: 65 minutes  [facility]

## 2016-03-16 NOTE — Op Note (Signed)
03/16/2016  4:30 PM  PATIENT:  Leah Holland   MRN: [id number]  PRE-OPERATIVE DIAGNOSIS:  Right intertrochanteric proximal femur nonunion with hardware failure and subtrochanteric femur fracture  POST-OPERATIVE DIAGNOSIS:  Same  PROCEDURE:  Procedure(s): Right hip and femur removal of intramedullary nail, complex, with treatment of intertrochanteric hip fracture nonunion with prosthetic replacement  PREOPERATIVE INDICATIONS:  Leah Holland is an 82 y.o. female who was admitted 03/16/2016 with a diagnosis of right intertrochanteric proximal femur nonunion who progressed to ultimately break her intramedullary nail and elected for surgical management.   The risks benefits and alternatives were discussed with the patient including but not limited to the risks of nonoperative treatment, versus surgical intervention including infection, bleeding, nerve injury, periprosthetic fracture, the need for revision surgery, dislocation, leg length discrepancy, blood clots, cardiopulmonary complications, morbidity, mortality, among others, and they were willing to proceed.  Predicted outcome is good, although there will be at least a six to nine month expected recovery. We also discussed the potential for persistent limp, inability to regain ambulatory function, among others.  OPERATIVE REPORT     Primary surgeon: Frederik Pear, M.D.  CO-SURGEON:  Marchia Bond, MD, required for complex medical decision-making, assistance with instrumentation, given the complex revision nature of this operation with broken hardware.    ASSISTANT:  Joya Gaskins, OPA-C  (Present throughout the entire procedure,  necessary for completion of procedure in a timely manner, assisting with retraction, instrumentation, and closure)     ANESTHESIA:  General    COMPLICATIONS:  None.     Estimated blood loss: 1000 mL  COMPONENTS:  Depuy solution size 16.5 femoral stem diaphyseal fixation with a -3 femoral neck and a 44 mm head  ball.  Cultures: Trochanteric bursal cultures 1, and deep femoral bone cultures 1. There was no evidence clinically of infection.  PROCEDURE IN DETAIL: The patient was met in the holding area and identified.  The appropriate hip  was marked at the operative site. The patient was then transported to the OR and  placed under general anesthesia.  At that point, the patient was  placed in the lateral decubitus position with the operative side up and  secured to the operating room table and all bony prominences padded.     The operative lower extremity was prepped from the iliac crest to the toes.  Sterile draping was performed.  Time out was performed prior to incision.      A routine posterolateral approach was utilized via sharp dissection  carried down to the subcutaneous tissue.  Gross bleeders were Bovie  coagulated.  The iliotibial band was identified and incised  along the length of the skin incision.  Self-retaining retractors were  inserted.    The helical blade was exposed and removed with the appropriate screwdriver.  We were then able to perform a capsular release, and exposed the femoral head, and actually dislocated the hip as a single unit.  The cutting jig was utilized to resect the femoral neck, although landmarks were fairly challenging to recognize given the absence of the lesser trochanter.  I shelled out the inner portion of the greater trochanter in order to allow for positioning of the ultimate prosthesis, and during this process I also exposed the entry point and the proximal fractured portion of the nail. Ultimately I was able to access this with some difficulty, and removed the proximal aspect of the nail.  I then introduced a hooked nail extraction device down across the nail within  the canal full-length of the femur, and back slapped the nail out. This is also somewhat challenging.  I then exposed the deep acetabulum, cleared out any tissue including the ligamentum  teres. The acetabulum was inspected and was still in remarkably good condition given her age.  The proximal femur had healed adequately for a fibrous union, and surprisingly I was able to instrument and prepared the proximal femur through the intertrochanteric region and down into the canal..      I began with the reamer, and went up to a size 16.5 before I had good cortical feel. There was a fair amount of sclerotic bone within the metaphysis, and after broaching I was not able to get the 16 5 broach down, so I used combination of the reamers set on drill to excavate the metaphysis to allow for diaphyseal fixation of the 16.5.  I was ultimately able to get the trial broach down appropriately, and trialed with a neutral which did not go in, so I went to a -3 which had appropriate restoration of leg length and stability. I could not get the broach down any further and did not feel like removing more proximal bone was appropriate, and the -3 gave [place] excellent stability and restoration of leg length. The femoral head sized to a 44. The trial was reduced, had excellent stability with functional range of motion. The trial components were then removed.  I then place the real implant and I impacted the real head ball into place. The hip was then reduced and taken through functional range of motion and found to have excellent stability. Leg lengths were restored.  I then used a 2 mm drill bits to pass the FiberWire suture from the capsule and piriformis through the greater trochanter, and secured this.  there was a fair amount of destruction to the trochanter, so I actually only used one FiberWire through the trochanter, and the superior limb of the FiberWire I brought through the abductors repairing the posterior soft tissue sleeve.   I then irrigated the hip copiously again with pulse lavage, and repaired the fascia with Vicryl, followed by Vicryl for the subcutaneous tissue, Monocryl for the skin,  Steri-Strips and sterile gauze. The wounds were injected. The patient was then awakened and returned to PACU in stable and satisfactory condition. There were no complications.  Marchia Bond, MD Orthopedic Surgeon [phone]   03/16/2016 4:30 PM

## 2016-03-16 NOTE — Anesthesia Procedure Notes (Signed)
Procedure Name: Intubation Date/Time: 03/16/2016 2:25 PM Performed by: Ignacia Bayley Pre-anesthesia Checklist: Patient identified, Emergency Drugs available, Suction available and Patient being monitored Patient Re-evaluated:Patient Re-evaluated prior to inductionOxygen Delivery Method: Circle system utilized Preoxygenation: Pre-oxygenation with 100% oxygen Intubation Type: IV induction Ventilation: Mask ventilation without difficulty Laryngoscope Size: Miller and 2 Grade View: Grade I Tube type: Oral Tube size: 7.0 mm Number of attempts: 1 Airway Equipment and Method: Stylet Placement Confirmation: ETT inserted through vocal cords under direct vision,  positive ETCO2 and breath sounds checked- equal and bilateral Secured at: 20 cm Tube secured with: Tape Dental Injury: Teeth and Oropharynx as per pre-operative assessment

## 2016-03-16 NOTE — Anesthesia Preprocedure Evaluation (Signed)
Anesthesia Evaluation  Patient identified by MRN, date of birth, ID band Patient awake    Reviewed: Allergy & Precautions, NPO status , Patient's Chart, lab work & pertinent test results  History of Anesthesia Complications (+) PONV and history of anesthetic complications  Airway Mallampati: II  TM Distance: >3 FB Neck ROM: Full    Dental  (+) Upper Dentures, Lower Dentures   Pulmonary    breath sounds clear to auscultation       Cardiovascular hypertension, Pt. on medications + dysrhythmias Atrial Fibrillation  Rhythm:Regular     Neuro/Psych PSYCHIATRIC DISORDERS Anxiety Depression Bipolar Disorder  Neuromuscular disease    GI/Hepatic hiatal hernia, GERD  Medicated and Controlled,  Endo/Other  diabetes, Type 2Hypothyroidism   Renal/GU      Musculoskeletal  (+) Arthritis ,   Abdominal   Peds  Hematology  (+) anemia ,   Anesthesia Other Findings   Reproductive/Obstetrics                             Anesthesia Physical Anesthesia Plan  ASA: III  Anesthesia Plan: General   Post-op Pain Management:    Induction: Intravenous  Airway Management Planned: Oral ETT  Additional Equipment: None  Intra-op Plan:   Post-operative Plan: Extubation in OR  Informed Consent: I have reviewed the patients History and Physical, chart, labs and discussed the procedure including the risks, benefits and alternatives for the proposed anesthesia with the patient or authorized representative who has indicated his/her understanding and acceptance.   Dental advisory given  Plan Discussed with: CRNA and Surgeon  Anesthesia Plan Comments:         Anesthesia Quick Evaluation

## 2016-03-16 NOTE — Progress Notes (Signed)
Patient has arrived to unit.  She is a direct admit from home.  She came with her son.  She is alert and oriented.  Has right hip pain with movement - 2 person assist.  Called on call to inform that patient has arrived and will need orders; waiting for call back.  Patient's son has her rings; earrings, and watch.  Patient has her bilateral hearing aids at the bedside; her dentures, and her glasses.  She refuses to take them out until necessary.  Will pass information to day shift staff to follow up.

## 2016-03-16 NOTE — Progress Notes (Signed)
RN received patient at [facility]. Patient received 1 unit PRBC in the PACU, started at 1648 in the OR, finished at 1825. Unit # [id number]  Vitals WDL, patient has no complaints at this time.

## 2016-03-16 NOTE — Consult Note (Signed)
ORTHOPAEDIC CONSULTATION  REQUESTING PHYSICIAN: Marchia Bond, MD  Chief Complaint: Right hip pain  HPI: Leah Holland is a 82 y.o. female who complains of  acute on chronic right hip pain. She broke her hip earlier this year, and underwent intramedullary nailing. This was a basocervical femoral neck fracture. She unfortunately has progressed on to nonunion, and recently broke her hardware. She was actually scheduled for elective surgery, but having broken the hardware has now progressed this to an urgent situation. She can no longer walk. Any movement of the right hip causes severe pain. Unable to bear weight. She is normally highly functional, and wants to get back to being independent.  Past Medical History  Diagnosis Date  . Complication of anesthesia   . PONV (postoperative nausea and vomiting)   . Hypertension   . Hyperlipidemia   . Swelling of extremity     sees Dr. Gwenlyn Found, cardiologist 1 x year [phone]  . Bronchitis 12/2011  . Hypothyroidism     takes synthroid  . GERD (gastroesophageal reflux disease)   . Neuromuscular disorder ([place])     carpal tunnel in right hand  . Insomnia   . Hyperlipidemia   . Skin cancer of face 2000's    "one on each side of my face and one on my nose" (08/13/2013)  . Diabetes mellitus type 2, diet-controlled ([place])   . History of blood transfusion     "w/both knee OR's" (08/13/2013)  . H/O hiatal hernia   . Arthritis     "probably" (08/13/2013)  . Atrial fibrillation ([place])     new onset/pt report 08/13/2013  . Depression   . Anxiety attack     "take RX prn" (08/13/2013)  . Intertrochanteric fracture of right hip ([place]) 07/30/2015  . Periprosthetic fracture around internal prosthetic right hip joint ([place]), nonunion basicervical femoral neck with broken hardware and subtrochanteric fracture 03/16/2016   Past Surgical History  Procedure Laterality Date  . Total knee arthroplasty Right 2005  . Carpal tunnel release Right ~ 2005  . Eye surgery    .  Breast biopsy Left ?1970's  . Cardiovascular stress test  12/07/11  . Doppler echocardiography  12/07/11  . Total knee arthroplasty  03/02/2012    Procedure: TOTAL KNEE ARTHROPLASTY;  Surgeon: Yvette Rack., MD;  Location: [facility];  Service: Orthopedics;  Laterality: Left;  . Joint replacement      bilateral knee replacement  . Cataract extraction, bilateral Bilateral 2000  . Skin cancer excision  2000's    "off my nose" (08/13/2013)  . Dilation and curettage of uterus  1950's  . Cardiovascular stress test  12/07/2011    LEXISCAN, normal scan, no significant wall abnormalities noted  . 2d echocardiogram  12/07/2004    EF 48%, moderate pulmonary hypertension  . Femur im nail Right 08/01/2015    Procedure: INTRAMEDULLARY (IM) NAIL FEMORAL;  Surgeon: Marchia Bond, MD;  Location: [facility];  Service: Orthopedics;  Laterality: Right;   Social History   Social History  . Marital Status: Widowed    Spouse Name: N/A  . Number of Children: N/A  . Years of Education: N/A   Social History Main Topics  . Smoking status: Never Smoker   . Smokeless tobacco: Never Used  . Alcohol Use: No  . Drug Use: No  . Sexual Activity: No   Other Topics Concern  . Not on file   Social History Narrative   Widowed. Lives at [facility]. Son died in  the [place] War and she has not slept well since then.   Family History  Problem Relation Age of Onset  . Arrhythmia Mother   . Heart disease Mother   . Arrhythmia Sister   . Arrhythmia Brother   . Heart disease Brother   . Stroke Father 68  . Heart disease Brother   . Heart disease Brother   . Cancer Brother     Colon cancer  . Heart disease Sister   . Heart disease Sister   . Stroke Sister   . Diabetes Sister   . Heart disease Child 33  . Heart disease Child 43   Allergies  Allergen Reactions  . Morphine And Related Other (See Comments)    Made pt "wild"     Positive ROS: All other systems have been reviewed and were otherwise negative  with the exception of those mentioned in the HPI and as above.  Physical Exam: General: Alert, no acute distress Cardiovascular: Mild bilateral pedal edema, 1+  Respiratory: No cyanosis, no use of accessory musculature GI: No organomegaly, abdomen is soft and non-tender Skin: No lesions in the area of chief complaint Neurologic: Sensation intact distally Psychiatric: Patient is competent for consent with normal mood and affect Lymphatic: No axillary or cervical lymphadenopathy  MUSCULOSKELETAL: right knee has well-healed surgical wounds from a previous total knee replacement. Right hip also has well-healed surgical wounds, EHL and FHL are intact, the right leg is significantly shortened.   Assessment: Principal Problem:   Periprosthetic fracture around internal prosthetic right hip joint ([place]), nonunion basicervical femoral neck with broken hardware and subtrochanteric fracture  She also has multiple coexisting medical problems as listed above.  Plan: This is now a severe problem, with extremely high risk. I recommended surgical intervention with removal of the broken nail, and conversion to hemiarthroplasty versus total hip arthroplasty depending on the degree of articular damage on the acetabular side.  The risks benefits and alternatives were discussed with the patient including but not limited to the risks of nonoperative treatment, versus surgical intervention including infection, bleeding, nerve injury, periprosthetic fracture, the need for revision surgery, dislocation, leg length discrepancy, blood clots, cardiopulmonary complications, morbidity, mortality, among others, and they were willing to proceed.   I also discussed the potential that she may need a blood transfusion. Will plan for surgery later today. I appreciate the [facility] hospitalist service help with her complex medical management. She was also being evaluated by her cardiologist.    Johnny Bridge, MD Cell [phone]   03/16/2016 8:26 AM

## 2016-03-17 ENCOUNTER — Encounter (HOSPITAL_COMMUNITY): Payer: Self-pay | Admitting: Orthopedic Surgery

## 2016-03-17 DIAGNOSIS — I480 Paroxysmal atrial fibrillation: Secondary | ICD-10-CM

## 2016-03-17 DIAGNOSIS — I10 Essential (primary) hypertension: Secondary | ICD-10-CM

## 2016-03-17 DIAGNOSIS — E119 Type 2 diabetes mellitus without complications: Secondary | ICD-10-CM

## 2016-03-17 DIAGNOSIS — M9701XD Periprosthetic fracture around internal prosthetic right hip joint, subsequent encounter: Secondary | ICD-10-CM

## 2016-03-17 LAB — TYPE AND SCREEN
ABO/RH(D): O NEG
ANTIBODY SCREEN: NEGATIVE
UNIT DIVISION: 0
Unit division: 0

## 2016-03-17 LAB — BASIC METABOLIC PANEL
ANION GAP: 13 (ref 5–15)
BUN: 14 mg/dL (ref 6–20)
CHLORIDE: 94 mmol/L — AB (ref 101–111)
CO2: 26 mmol/L (ref 22–32)
Calcium: 8.5 mg/dL — ABNORMAL LOW (ref 8.9–10.3)
Creatinine, Ser: 0.94 mg/dL (ref 0.44–1.00)
GFR calc Af Amer: 60 mL/min — ABNORMAL LOW (ref >60)
GFR calc non Af Amer: 51 mL/min — ABNORMAL LOW (ref >60)
Glucose, Bld: 138 mg/dL — ABNORMAL HIGH (ref 65–99)
POTASSIUM: 3.8 mmol/L (ref 3.5–5.1)
SODIUM: 133 mmol/L — AB (ref 135–145)

## 2016-03-17 LAB — CBC
HCT: 28.7 % — ABNORMAL LOW (ref 36.0–46.0)
HEMOGLOBIN: 9.8 g/dL — AB (ref 12.0–15.0)
MCH: 30.6 pg (ref 26.0–34.0)
MCHC: 34.1 g/dL (ref 30.0–36.0)
MCV: 89.7 fL (ref 78.0–100.0)
PLATELETS: 247 10*3/uL (ref 150–400)
RBC: 3.2 MIL/uL — ABNORMAL LOW (ref 3.87–5.11)
RDW: 15 % (ref 11.5–15.5)
WBC: 15.1 10*3/uL — AB (ref 4.0–10.5)

## 2016-03-17 LAB — GLUCOSE, CAPILLARY
GLUCOSE-CAPILLARY: 132 mg/dL — AB (ref 65–99)
GLUCOSE-CAPILLARY: 127 mg/dL — AB (ref 65–99)
Glucose-Capillary: 142 mg/dL — ABNORMAL HIGH (ref 65–99)
Glucose-Capillary: 148 mg/dL — ABNORMAL HIGH (ref 65–99)

## 2016-03-17 MED ORDER — MORPHINE SULFATE (PF) 2 MG/ML IV SOLN: 1.0000 mg | INTRAVENOUS | Status: DC | PRN

## 2016-03-17 MED ORDER — DILTIAZEM HCL ER COATED BEADS 180 MG PO CP24
180.0000 mg | ORAL_CAPSULE | Freq: Every day | ORAL | Status: DC
  Administered 2016-03-17 – 2016-03-18 (×2): 180 mg via ORAL
  Filled 2016-03-17 (×2): qty 1

## 2016-03-17 MED ORDER — TERIPARATIDE (RECOMBINANT) 600 MCG/2.4ML ~~LOC~~ SOLN: 20.0000 ug | Freq: Every day | SUBCUTANEOUS | Status: DC

## 2016-03-17 MED ORDER — ATORVASTATIN CALCIUM 80 MG PO TABS
80.0000 mg | ORAL_TABLET | Freq: Every day | ORAL | Status: DC
  Administered 2016-03-17 – 2016-03-18 (×2): 80 mg via ORAL
  Filled 2016-03-17 (×2): qty 1

## 2016-03-17 NOTE — Progress Notes (Addendum)
[name], female   DOB: 1922/07/06, 82 y.o.   MRN: [id number] [name]  MRN: [id number]  DOB/AGE:  July 23, 1922 / 82 y.o.  1 Day Post-Op Procedure(s) (LRB): TOTAL HIP ARTHROPLASTY (Right) HARDWARE REMOVAL (Right)    PROGRESS NOTE Subjective: Patient is alert, oriented, x2 Nausea, no Vomiting, yes passing gas, . Taking PO sips. Denies SOB, Chest or Calf Pain. Using Incentive Spirometer, PAS in place. Ambulate WBAT Patient reports pain as  3/10. Reviewed actual surgery with patient and showed her photograph of her new hemi-hip explained that she can be weightbearing as tolerated. For confidence, applied 50 pounds pressure to her foot and she reported pain was much less than it was preop.  Objective: Vital signs in last 24 hours: Filed Vitals:   03/16/16 1845 03/16/16 1906 03/16/16 2013 03/17/16 0500  BP: 147/76 133/66 98/53 129/62  Pulse: 90 88 97 89  Temp:  98.1 F (36.7 C) 97.9 F (36.6 C) 98.6 F (37 C)  TempSrc:   Oral Oral  Resp: 16 16 16 16   SpO2: 100% 98% 97% 99%      Intake/Output from previous day: I/O last 3 completed shifts: In: 2930 [I.V.:1650; Blood:670; IV Piggyback:610] Out: 1985 [Urine:600; Other:335; Blood:1050]   Intake/Output this shift:     LABORATORY DATA:  Recent Labs  03/16/16 1028 03/16/16 1311 03/16/16 2244 03/17/16 0627 03/17/16 0655  WBC 8.2  --   --  15.1*  --   HGB 13.2  --   --  9.8*  --   HCT 38.8  --   --  28.7*  --   PLT 274  --   --  247  --   NA 134*  --   --   --   --   K 3.6  --   --   --   --   CL 93*  --   --   --   --   CO2 29  --   --   --   --   BUN 8  --   --   --   --   CREATININE 0.68  --   --   --   --   GLUCOSE 109*  --   --   --   --   GLUCAP  --  93 127*  --  132*  INR 1.14  --   --   --   --   CALCIUM 9.9  --   --   --   --     Examination: Neurologically intact ABD soft Neurovascular intact Sensation intact distally Intact pulses distally Dorsiflexion/Plantar flexion  intact Incision: scant drainage No cellulitis present Compartment soft} XR AP&Lat of hip shows well placed\fixed THA  Assessment:   1 Day Post-Op Procedure(s) (LRB): TOTAL HIP ARTHROPLASTY (Right) HARDWARE REMOVAL (Right) ADDITIONAL DIAGNOSIS:  Expected Acute Blood Loss Anemia, Diabetes and Hypertension, GERD  Plan: PT/OT WBAT, Hemihip, Posterior Precautions  DVT Prophylaxis: SCDx72 hrs, eliquis  DISCHARGE PLAN: Skilled Nursing Facility/Rehab  DISCHARGE NEEDS: HHPT, Walker and 3-in-1 comode seat

## 2016-03-17 NOTE — Progress Notes (Signed)
[facility] PROGRESS NOTE  Leah STITELER [id number] DOB: [birth year] DOA: 03/16/2016 PCP: Anthoney Harada, MD  82 year old independent female resident of an assist living with history of hypertension, hypothyroidism, A. fib on Eliquis, diabetes mellitus presented with worsened chronic right hip pain. She had a hip fracture earlier this year and underwent IM nailing. This progressed into a nonunion and then broke her hardware. She was taken for an elective surgery but since her pain was severe and unable to bear weight or ambulate she was admitted to hospital service for surgery. Patient underwent removal of intramedullary nail with treatment of the intertrochanteric hip fracture nonunion with prosthetic right hip replacement.   Assessment/Plan: Periprosthetic right hip fracture with nonunion of the femoral neck with broken hardware and subsequent trochanteric fracture Status post surgical repair with a right hip hemiarthroplasty. Tolerated well. Foley discontinued. Pain control with when necessary IV morphine and Vicodin. She already on Eliquis for A. Fib, does not need further DVT prophylaxis. PT evaluation. Will likely need skilled nursing facility.  Postoperative  blood loss anemia Continue iron supplement. Monitor H&H.  Paroxysmal A. fib Stable. Continue metoprolol for rate control. Eliquis resumed.   Diabetes mellitus Diet controlled. Continue monitoring with sliding scale coverage.  Hypothyroidism Continue Synthroid  Essential Hypertension Stable. Continue home medications (metoprolol, Cardizem, Lasix and benazepril)  DVT prophylaxis: Eliquis Diet: Diabetic  Code Status: Full code Family Communication: None at bedside Disposition Plan: Skilled nursing facility possibly tomorrow.   Consultants:  Orthopedics (Dr. [name])  Procedures:  ProstHEtic right hip replacement  Antibiotics:  None  HPI/Subjective: Seen and examined. Reports her right hip  pain to be better and painful only on movement.  Objective: Filed Vitals:   03/17/16 0500 03/17/16 1031  BP: 129/62 129/62  Pulse: 89   Temp: 98.6 F (37 C)   Resp: 16     Intake/Output Summary (Last 24 hours) at 03/17/16 1102 Last data filed at 03/17/16 0500  Gross per 24 hour  Intake   2930 ml  Output   1985 ml  Net    945 ml   There were no vitals filed for this visit.  Exam:   General:  Elderly Female not in distress  HEENT: No pallor, moist mucosa, supple neck  Chest: Clear bilaterally  Cardiovascular: S1 and S2, no murmurs  GI: Soft, nondistended, nontender, bowel sounds present  Musculoskeletal: Warm, dressing over right hip  CNS: Alert and oriented  Data Reviewed: Basic Metabolic Panel:  Recent Labs Lab 03/16/16 1028 03/17/16 0627  NA 134* 133*  K 3.6 3.8  CL 93* 94*  CO2 29 26  GLUCOSE 109* 138*  BUN 8 14  CREATININE 0.68 0.94  CALCIUM 9.9 8.5*   Liver Function Tests:  Recent Labs Lab 03/16/16 1028  AST 19  ALT 19  ALKPHOS 141*  BILITOT 1.1  PROT 6.3*  ALBUMIN 3.6   No results for input(s): LIPASE, AMYLASE in the last 168 hours. No results for input(s): AMMONIA in the last 168 hours. CBC:  Recent Labs Lab 03/16/16 1028 03/17/16 0627  WBC 8.2 15.1*  HGB 13.2 9.8*  HCT 38.8 28.7*  MCV 91.9 89.7  PLT 274 247   Cardiac Enzymes: No results for input(s): CKTOTAL, CKMB, CKMBINDEX, TROPONINI in the last 168 hours. BNP (last 3 results) No results for input(s): BNP in the last 8760 hours.  ProBNP (last 3 results) No results for input(s): PROBNP in the last 8760 hours.  CBG:  Recent Labs Lab 03/16/16 1311 03/16/16 2244  03/17/16 0655  GLUCAP 93 127* 132*    Recent Results (from the past 240 hour(s))  Surgical pcr screen     Status: None   Collection Time: 03/16/16  1:15 PM  Result Value Ref Range Status   MRSA, PCR NEGATIVE NEGATIVE Final   Staphylococcus aureus NEGATIVE NEGATIVE Final    Comment:        The Xpert  SA Assay (FDA approved for NASAL specimens in patients over 47 years of age), is one component of a comprehensive surveillance program.  Test performance has been validated by [facility] for patients greater than or equal to 37 year old. It is not intended to diagnose infection nor to guide or monitor treatment.   Anaerobic culture     Status: None (Preliminary result)   Collection Time: 03/16/16  2:58 PM  Result Value Ref Range Status   Specimen Description TISSUE  Final   Special Requests LEFT TROCHANTERIC BURSA  Final   Gram Stain   Final    NO WBC SEEN NO ORGANISMS SEEN Performed at [facility]    Culture PENDING  Incomplete   Report Status PENDING  Incomplete  Tissue culture     Status: None (Preliminary result)   Collection Time: 03/16/16  2:58 PM  Result Value Ref Range Status   Specimen Description TISSUE  Final   Special Requests LEFT TROCHANTERIC BURSA  Final   Gram Stain   Final    NO WBC SEEN NO ORGANISMS SEEN Performed at [facility]    Culture PENDING  Incomplete   Report Status PENDING  Incomplete  Anaerobic culture     Status: None (Preliminary result)   Collection Time: 03/16/16  3:18 PM  Result Value Ref Range Status   Specimen Description TISSUE  Final   Special Requests DEEP FEMUR  Final   Gram Stain   Final    NO WBC SEEN NO ORGANISMS SEEN Performed at [facility]    Culture PENDING  Incomplete   Report Status PENDING  Incomplete  Tissue culture     Status: None (Preliminary result)   Collection Time: 03/16/16  3:18 PM  Result Value Ref Range Status   Specimen Description TISSUE  Final   Special Requests DEEP FEMUR  Final   Gram Stain   Final    NO WBC SEEN NO ORGANISMS SEEN Performed at [facility]    Culture PENDING  Incomplete   Report Status PENDING  Incomplete     Studies: Pelvis Portable  03/16/2016  CLINICAL DATA:  Revision of right hip ORIF. EXAM: PORTABLE PELVIS 1-2 VIEWS COMPARISON:  CTA  on 01/25/2016 FINDINGS: Frontal projection radiographs show placement of a bipolar hemiarthroplasty on the right which shows normal alignment. Mildly displaced trochanteric fragments remain. IMPRESSION: Normal alignment of right hip bipolar arthroplasty. Electronically Signed   By: Aletta Edouard M.D.   On: 03/16/2016 18:20   Dg Chest Port 1 View  03/16/2016  CLINICAL DATA:  Preop, right hip surgery EXAM: PORTABLE CHEST 1 VIEW COMPARISON:  07/30/2015 FINDINGS: Cardiomediastinal silhouette is stable. Mild hyperinflation again noted. Atherosclerotic calcifications of thoracic aorta. No infiltrate or pulmonary edema. IMPRESSION: No active disease. Electronically Signed   By: Lahoma Crocker M.D.   On: 03/16/2016 10:35    Scheduled Meds: . apixaban  2.5 mg Oral BID  . benazepril  40 mg Oral QPM  . chlorthalidone  25 mg Oral Daily  . docusate sodium  100 mg Oral BID  . [  START ON 03/18/2016] fentaNYL  25 mcg Transdermal Q3 days  . ferrous sulfate  325 mg Oral BID WC  . FLUoxetine  40 mg Oral Daily  . insulin aspart  0-9 Units Subcutaneous TID WC  . levothyroxine  125 mcg Oral QAC breakfast  . metoprolol  50 mg Oral BID  . pantoprazole  80 mg Oral Q1200  . polyethylene glycol  17 g Oral QODAY  . senna  1 tablet Oral BID  . sodium chloride flush  3 mL Intravenous Q12H   Continuous Infusions: . sodium chloride 75 mL/hr (03/16/16 2135)      Time spent: 25 minutes    Larance Ratledge, [place]  [facility] Pager [phone]. If 7PM-7AM, please contact night-coverage at [email or web address], password [facility] 03/17/2016, 11:02 AM  LOS: 1 day

## 2016-03-17 NOTE — Progress Notes (Signed)
     Subjective:  Patient reports pain as moderate.  She does not like her hip abduction pillow. She is in the chair. Overall looking well.  Objective:   VITALS:   Filed Vitals:   03/16/16 1906 03/16/16 2013 03/17/16 0500 03/17/16 1031  BP: 133/66 98/53 129/62 129/62  Pulse: 88 97 89   Temp: 98.1 F (36.7 C) 97.9 F (36.6 C) 98.6 F (37 C)   TempSrc:  Oral Oral   Resp: 16 16 16    SpO2: 98% 97% 99%     Neurologically intact Dorsiflexion/Plantar flexion intact Incision: dressing C/D/I and scant drainage   Lab Results  Component Value Date   WBC 15.1* 03/17/2016   HGB 9.8* 03/17/2016   HCT 28.7* 03/17/2016   MCV 89.7 03/17/2016   PLT 247 03/17/2016   BMET    Component Value Date/Time   NA 133* 03/17/2016 0627   K 3.8 03/17/2016 0627   CL 94* 03/17/2016 0627   CO2 26 03/17/2016 0627   GLUCOSE 138* 03/17/2016 0627   BUN 14 03/17/2016 0627   CREATININE 0.94 03/17/2016 0627   CALCIUM 8.5* 03/17/2016 0627   GFRNONAA 51* 03/17/2016 0627   GFRAA 60* 03/17/2016 0627     Assessment/Plan: 1 Day Post-Op   Principal Problem:   Periprosthetic fracture around internal prosthetic right hip joint ([place]), nonunion basicervical femoral neck with broken hardware and subtrochanteric fracture Active Problems:   Hypertension   Hyperlipidemia   Hypothyroidism   GERD (gastroesophageal reflux disease)   Paroxysmal atrial fibrillation ([place])   Diabetes mellitus type 2, diet-controlled ([place])   Bipolar disorder ([place])   Hip fx ([place])   Advance diet Up with therapy Discharge to SNF Acute blood loss anemia, improved and maintained with the blood transfusion at the completion of the operation yesterday. Anticipate further loss over the next day or 2. Monitor hemoglobin.  We can discontinue the abduction pillow, and simply use pillows if needed while in bed.  She will likely need skilled nursing facility, possibly over the weekend if available, otherwise on  Monday.   Jiayi Lengacher P 03/17/2016, 1:38 PM   Marchia Bond, MD Cell [phone]

## 2016-03-17 NOTE — Evaluation (Addendum)
Physical Therapy Evaluation Patient Details Name: Leah Holland MRN: [id number] DOB: [birth year] Today's Date: 03/17/2016   History of Present Illness  82 yo admitted after fall with right femur fx s/p hip hemiarthroplasty. PMHx: fall, right hip fx Aug 2016, HTN, HLD, Afib, DM, bipolar, bil TKA  Clinical Impression  Pt very pleasant and able to recall 2/3 precautions end of session but requires cues throughout mobility to maintain. Pt with decreased strength, transfers, gait and function who will benefit from acute therapy to maximize mobility, function, strength and independence to decrease burden of care. Pt educated for all precautions, HEP, transfers and gait. Abduction pillow in place end of session with NT notified of mobility and precautions. Handouts for precautions and HEP provided.     Follow Up Recommendations SNF;Supervision/Assistance - 24 hour    Equipment Recommendations  None recommended by PT    Recommendations for Other Services       Precautions / Restrictions Precautions Precautions: Fall;Posterior Hip Restrictions RLE Weight Bearing: Weight bearing as tolerated      Mobility  Bed Mobility Overal bed mobility: Needs Assistance Bed Mobility: Supine to Sit     Supine to sit: Min guard     General bed mobility comments: guarding to prevent RLE internal rotation, cues for sequence  Transfers Overall transfer level: Needs assistance   Transfers: Sit to/from Stand Sit to Stand: Min guard         General transfer comment: cues for hand and RLE positioning with transfers as well as guarding RLE to prevent internal rotation  Ambulation/Gait Ambulation/Gait assistance: Min guard Ambulation Distance (Feet): 15 Feet Assistive device: Rolling walker (2 wheeled) Gait Pattern/deviations: Step-to pattern;Decreased stride length   Gait velocity interpretation: Below normal speed for age/gender General Gait Details: cues for sequence, posture, RW use, safety  and to turn out RLE with turning right  Stairs            Wheelchair Mobility    Modified Rankin (Stroke Patients Only)       Balance Overall balance assessment: Needs assistance   Sitting balance-Leahy Scale: Good       Standing balance-Leahy Scale: Poor                               Pertinent Vitals/Pain Pain Assessment: 0-10 Pain Score: 6  Pain Location: right hip Pain Intervention(s): Limited activity within patient's tolerance;Monitored during session;Premedicated before session;Repositioned    Home Living Family/patient expects to be discharged to:: Skilled nursing facility (independent living) Living Arrangements: Alone Available Help at Discharge: Personal care attendant Type of Home: Apartment       Home Layout: One level Home Equipment: Walker - 2 wheels;Cane - single point;Grab bars - tub/shower;Other (comment);Bedside commode;Wheelchair - [employer] - 4 wheels Additional Comments: Pt resides at independent living at [facility] and plans on D/C to [facility]    Prior Function Level of Independence: Needs assistance   Gait / Transfers Assistance Needed: uses rollator for gait, WC to dining hall   ADL's / [employer] Assistance Needed: aide 2 hrs a day        Hand Dominance        Extremity/Trunk Assessment   Upper Extremity Assessment: Generalized weakness           Lower Extremity Assessment: Generalized weakness      Cervical / Trunk Assessment: Normal  Communication   Communication: HOH  Cognition Arousal/Alertness: Awake/alert Behavior During  Therapy: WFL for tasks assessed/performed         Memory: Decreased recall of precautions              General Comments      Exercises Total Joint Exercises Heel Slides: AROM;Right;5 reps;Supine Hip ABduction/ADduction: AROM;5 reps;Right;Supine      Assessment/Plan    PT Assessment Patient needs continued PT services  PT Diagnosis Difficulty  walking;Acute pain;Generalized weakness   PT Problem List Decreased strength;Decreased range of motion;Decreased activity tolerance;Decreased balance;Decreased mobility;Pain;Decreased knowledge of precautions;Decreased knowledge of use of DME  PT Treatment Interventions Gait training;DME instruction;Patient/family education;Therapeutic activities;Therapeutic exercise;Functional mobility training   PT Goals (Current goals can be found in the Care Plan section) Acute Rehab PT Goals Patient Stated Goal: return to my apartment after I get better PT Goal Formulation: With patient Time For Goal Achievement: 03/24/16 Potential to Achieve Goals: Good    Frequency Min 5X/week   Barriers to discharge Decreased caregiver support      Co-evaluation               End of Session Equipment Utilized During Treatment: Gait belt Activity Tolerance: Patient tolerated treatment well Patient left: with call bell/phone within reach;with chair alarm set;in chair Nurse Communication: Mobility status;Precautions;Weight bearing status         Time: [id number] PT Time Calculation (min) (ACUTE ONLY): 29 min   Charges:   PT Evaluation $PT Eval Moderate Complexity: 1 Procedure PT Treatments $Gait Training: 8-22 mins   PT G CodesMelford Aase 03/17/2016, 12:19 PM Elwyn Reach, [place]

## 2016-03-17 NOTE — Discharge Instructions (Signed)
INSTRUCTIONS AFTER JOINT REPLACEMENT  ° °o Remove items at home which could result in a fall. This includes throw rugs or furniture in walking pathways °o ICE to the affected joint every three hours while awake for 30 minutes at a time, for at least the first 3-5 days, and then as needed for pain and swelling.  Continue to use ice for pain and swelling. You may notice swelling that will progress down to the foot and ankle.  This is normal after surgery.  Elevate your leg when you are not up walking on it.   °o Continue to use the breathing machine you got in the hospital (incentive spirometer) which will help keep your temperature down.  It is common for your temperature to cycle up and down following surgery, especially at night when you are not up moving around and exerting yourself.  The breathing machine keeps your lungs expanded and your temperature down. ° ° °DIET:  As you were doing prior to hospitalization, we recommend a well-balanced diet. ° °DRESSING / WOUND CARE / SHOWERING ° °You may change your dressing 3-5 days after surgery.  Then change the dressing every day with sterile gauze.  Please use good hand washing techniques before changing the dressing.  Do not use any lotions or creams on the incision until instructed by your surgeon. ° °ACTIVITY ° °o Increase activity slowly as tolerated, but follow the weight bearing instructions below.   °o No driving for 6 weeks or until further direction given by your physician.  You cannot drive while taking narcotics.  °o No lifting or carrying greater than 10 lbs. until further directed by your surgeon. °o Avoid periods of inactivity such as sitting longer than an hour when not asleep. This helps prevent blood clots.  °o You may return to work once you are authorized by your doctor.  ° ° ° °WEIGHT BEARING  ° °Weight bearing as tolerated with assist device (walker, cane, etc) as directed, use it as long as suggested by your surgeon or therapist, typically at  least 4-6 weeks. ° ° °EXERCISES ° °Results after joint replacement surgery are often greatly improved when you follow the exercise, range of motion and muscle strengthening exercises prescribed by your doctor. Safety measures are also important to protect the joint from further injury. Any time any of these exercises cause you to have increased pain or swelling, decrease what you are doing until you are comfortable again and then slowly increase them. If you have problems or questions, call your caregiver or physical therapist for advice.  ° °Rehabilitation is important following a joint replacement. After just a few days of immobilization, the muscles of the leg can become weakened and shrink (atrophy).  These exercises are designed to build up the tone and strength of the thigh and leg muscles and to improve motion. Often times heat used for twenty to thirty minutes before working out will loosen up your tissues and help with improving the range of motion but do not use heat for the first two weeks following surgery (sometimes heat can increase post-operative swelling).  ° °These exercises can be done on a training (exercise) mat, on the floor, on a table or on a bed. Use whatever works the best and is most comfortable for you.    Use music or television while you are exercising so that the exercises are a pleasant break in your day. This will make your life better with the exercises acting as a break   in your routine that you can look forward to.   Perform all exercises about fifteen times, three times per day or as directed.  You should exercise both the operative leg and the other leg as well. ° °Exercises include: °  °• Quad Sets - Tighten up the muscle on the front of the thigh (Quad) and hold for 5-10 seconds.   °• Straight Leg Raises - With your knee straight (if you were given a brace, keep it on), lift the leg to 60 degrees, hold for 3 seconds, and slowly lower the leg.  Perform this exercise against  resistance later as your leg gets stronger.  °• Leg Slides: Lying on your back, slowly slide your foot toward your buttocks, bending your knee up off the floor (only go as far as is comfortable). Then slowly slide your foot back down until your leg is flat on the floor again.  °• Angel Wings: Lying on your back spread your legs to the side as far apart as you can without causing discomfort.  °• Hamstring Strength:  Lying on your back, push your heel against the floor with your leg straight by tightening up the muscles of your buttocks.  Repeat, but this time bend your knee to a comfortable angle, and push your heel against the floor.  You may put a pillow under the heel to make it more comfortable if necessary.  ° °A rehabilitation program following joint replacement surgery can speed recovery and prevent re-injury in the future due to weakened muscles. Contact your doctor or a physical therapist for more information on knee rehabilitation.  ° ° °CONSTIPATION ° °Constipation is defined medically as fewer than three stools per week and severe constipation as less than one stool per week.  Even if you have a regular bowel pattern at home, your normal regimen is likely to be disrupted due to multiple reasons following surgery.  Combination of anesthesia, postoperative narcotics, change in appetite and fluid intake all can affect your bowels.  ° °YOU MUST use at least one of the following options; they are listed in order of increasing strength to get the job done.  They are all available over the counter, and you may need to use some, POSSIBLY even all of these options:   ° °Drink plenty of fluids (prune juice may be helpful) and high fiber foods °Colace 100 mg by mouth twice a day  °Senokot for constipation as directed and as needed Dulcolax (bisacodyl), take with full glass of water  °Miralax (polyethylene glycol) once or twice a day as needed. ° °If you have tried all these things and are unable to have a bowel  movement in the first 3-4 days after surgery call either your surgeon or your primary doctor.   ° °If you experience loose stools or diarrhea, hold the medications until you stool forms back up.  If your symptoms do not get better within 1 week or if they get worse, check with your doctor.  If you experience "the worst abdominal pain ever" or develop nausea or vomiting, please contact the office immediately for further recommendations for treatment. ° ° °ITCHING:  If you experience itching with your medications, try taking only a single pain pill, or even half a pain pill at a time.  You can also use Benadryl over the counter for itching or also to help with sleep.  ° °TED HOSE STOCKINGS:  Use stockings on both legs until for at least 2 weeks or as   directed by physician office. They may be removed at night for sleeping. ° °MEDICATIONS:  See your medication summary on the “After Visit Summary” that nursing will review with you.  You may have some home medications which will be placed on hold until you complete the course of blood thinner medication.  It is important for you to complete the blood thinner medication as prescribed. ° °PRECAUTIONS:  If you experience chest pain or shortness of breath - call 911 immediately for transfer to the hospital emergency department.  ° °If you develop a fever greater that 101 F, purulent drainage from wound, increased redness or drainage from wound, foul odor from the wound/dressing, or calf pain - CONTACT YOUR SURGEON.   °                                                °FOLLOW-UP APPOINTMENTS:  If you do not already have a post-op appointment, please call the office for an appointment to be seen by your surgeon.  Guidelines for how soon to be seen are listed in your “After Visit Summary”, but are typically between 1-4 weeks after surgery. ° °OTHER INSTRUCTIONS:  ° °Knee Replacement:  Do not place pillow under knee, focus on keeping the knee straight while resting. CPM  instructions: 0-90 degrees, 2 hours in the morning, 2 hours in the afternoon, and 2 hours in the evening. Place foam block, curve side up under heel at all times except when in CPM or when walking.  DO NOT modify, tear, cut, or change the foam block in any way. ° °MAKE SURE YOU:  °• Understand these instructions.  °• Get help right away if you are not doing well or get worse.  ° ° °Thank you for letting us be a part of your medical care team.  It is a privilege we respect greatly.  We hope these instructions will help you stay on track for a fast and full recovery!  ° °Information on my medicine - ELIQUIS® (apixaban) ° °Why was Eliquis® prescribed for you? °Eliquis® was prescribed for you to reduce the risk of forming blood clots that can cause a stroke if you have a medical condition called atrial fibrillation (a type of irregular heartbeat) OR to reduce the risk of a blood clots forming after orthopedic surgery. ° °What do You need to know about Eliquis® ? °Take your Eliquis® TWICE DAILY - one tablet in the morning and one tablet in the evening with or without food.  It would be best to take the doses about the same time each day. ° °If you have difficulty swallowing the tablet whole please discuss with your pharmacist how to take the medication safely. ° °Take Eliquis® exactly as prescribed by your doctor and DO NOT stop taking Eliquis® without talking to the doctor who prescribed the medication.  Stopping may increase your risk of developing a new clot or stroke.  Refill your prescription before you run out. ° °After discharge, you should have regular check-up appointments with your healthcare provider that is prescribing your Eliquis®.  In the future your dose may need to be changed if your kidney function or weight changes by a significant amount or as you get older. ° °What do you do if you miss a dose? °If you miss a dose, take it as soon as you remember on   the same day and resume taking twice daily.  Do not  take more than one dose of ELIQUIS at the same time. ° °Important Safety Information °A possible side effect of Eliquis® is bleeding. You should call your healthcare provider right away if you experience any of the following: °? Bleeding from an injury or your nose that does not stop. °? Unusual colored urine (red or dark brown) or unusual colored stools (red or black). °? Unusual bruising for unknown reasons. °? A serious fall or if you hit your head (even if there is no bleeding). ° °Some medicines may interact with Eliquis® and might increase your risk of bleeding or clotting while on Eliquis®. To help avoid this, consult your healthcare provider or pharmacist prior to using any new prescription or non-prescription medications, including herbals, vitamins, non-steroidal anti-inflammatory drugs (NSAIDs) and supplements. ° °This website has more information on Eliquis® (apixaban): www.Eliquis.com. ° ° ° ° °

## 2016-03-17 NOTE — Progress Notes (Signed)
Occupational Therapy Evaluation Patient Details Name: Leah Holland MRN: [id number] DOB: [birth year] Today's Date: 03/17/2016    History of Present Illness 82 yo admitted after fall with right femur fx s/p hip hemiarthroplasty. PMHx: fall, right hip fx Aug 2016, HTN, HLD, Afib, DM, bipolar, bil TKA   Clinical Impression   Pt admitted with the above diagnoses and presents with below problem list. Pt will benefit from continued acute OT to address the below listed deficits and maximize independence with BADLs prior to d/c to venue below. PTA pt was independent with most ADLs, supervision from personal care aide for showering. Pt is currently min to mod A for ADLs. Pt limited by nausea and pain this session. Verbal and tactile cueing needed at times for adherence to posterior precautions. Session details below.     Follow Up Recommendations  SNF    Equipment Recommendations  Other (comment) (defer to next venue)    Recommendations for Other Services       Precautions / Restrictions Precautions Precautions: Fall;Posterior Hip Restrictions Weight Bearing Restrictions: Yes RLE Weight Bearing: Weight bearing as tolerated      Mobility Bed Mobility Overal bed mobility: Needs Assistance Bed Mobility: Supine to Sit     Supine to sit: Min guard     General bed mobility comments: in recliner  Transfers Overall transfer level: Needs assistance Equipment used: Rolling walker (2 wheeled) Transfers: Sit to/from Stand Sit to Stand: Mod assist         General transfer comment: mod A to steady and assist with power up and control of descent. Pt reporting transfer was more difficult from recliner than EOB. Verbal and tactile cues for technique during sit<>stand. Pt guarding RLE. Pt with some nausea.    Balance Overall balance assessment: Needs assistance Sitting-balance support: Bilateral upper extremity supported;Feet supported Sitting balance-Leahy Scale: Good     Standing  balance support: Bilateral upper extremity supported;During functional activity Standing balance-Leahy Scale: Poor Standing balance comment: rw and light min A for balance in standing position. Stood about 1 minute some weighshifting incorporated with pt guarding RLE.                            ADL Overall ADL's : Needs assistance/impaired Eating/Feeding: Set up;Sitting   Grooming: Set up;Sitting;Cueing for compensatory techniques   Upper Body Bathing: Sitting;Set up;Cueing for compensatory techniques   Lower Body Bathing: Moderate assistance;Sit to/from stand;Cueing for compensatory techniques   Upper Body Dressing : Set up;Cueing for compensatory techniques;Sitting   Lower Body Dressing: Moderate assistance;Cueing for compensatory techniques;Sit to/from stand   Toilet Transfer: Moderate assistance;Stand-pivot;BSC;RW   Toileting- Clothing Manipulation and Hygiene: Moderate assistance;Cueing for compensatory techniques;Sit to/from stand         General ADL Comments: Pt c/o discomfort with abduction wedge. MD in room for part of session and instructed pt to use folded pillow instead. Pt c/o nausea and pain. Pt completed sit<>stnad from recliner with mod A for stability during power up needed. Stood with light min A for about 1 minute before requesting to sit down, weight shifting incorporated. Pt presenting with generalized weakness. Repositioned pt in recliner with folded pillow between LEs, feet elevated and back partially reclined. ADL education reviewed. Educated on posterior hip precautions using visual demonstration and examples of compensatory strategies for ADLs and functional transfers/mobility.      Vision     Perception     Praxis      Pertinent  Vitals/Pain Pain Assessment: 0-10 Pain Score: 5  Pain Location: right hip Pain Descriptors / Indicators: Aching;Sore Pain Intervention(s): Limited activity within patient's tolerance;Monitored during  session;Repositioned;Ice applied     Hand Dominance     Extremity/Trunk Assessment Upper Extremity Assessment Upper Extremity Assessment: Generalized weakness   Lower Extremity Assessment Lower Extremity Assessment: Defer to PT evaluation   Cervical / Trunk Assessment Cervical / Trunk Assessment: Normal   Communication Communication Communication: HOH   Cognition Arousal/Alertness: Awake/alert Behavior During Therapy: WFL for tasks assessed/performed;Anxious Overall Cognitive Status: Within Functional Limits for tasks assessed       Memory: Decreased recall of precautions             General Comments       Exercises       Shoulder Instructions      Home Living Family/patient expects to be discharged to:: Other (Comment) (independent living) Living Arrangements: Alone Available Help at Discharge: Personal care attendant Type of Home: [place]: One level     Bathroom Shower/Tub: Occupational psychologist: Standard     Home Equipment: Environmental consultant - 2 wheels;Cane - single point;Grab bars - tub/shower;Other (comment);Bedside commode;Wheelchair - [employer] - 4 wheels   Additional Comments: Pt resides at independent living at [facility] and plans on D/C to [place]      Prior Functioning/Environment Level of Independence: Needs assistance  Gait / Transfers Assistance Needed: uses rollator for gait, WC to dining hall  ADL's / [employer] Assistance Needed: aide 2 hrs a day assist with household tasks, meal prep, supervision for showering   Comments: Patient used cane outside on unlevel surface (in grass or garden)    OT Diagnosis: Generalized weakness;Acute pain   OT Problem List: Impaired balance (sitting and/or standing);Decreased knowledge of use of DME or AE;Decreased knowledge of precautions;Pain   OT Treatment/Interventions: Self-care/ADL training;DME and/or AE instruction;Therapeutic activities;Patient/family  education;Balance training    OT Goals(Current goals can be found in the care plan section) Acute Rehab OT Goals Patient Stated Goal: return to my apartment after I get better OT Goal Formulation: With patient Time For Goal Achievement: 03/24/16 Potential to Achieve Goals: Good ADL Goals Pt Will Perform Lower Body Bathing: with adaptive equipment;with modified independence;sit to/from stand Pt Will Perform Lower Body Dressing: with modified independence;with adaptive equipment;sit to/from stand Pt Will Transfer to Toilet: with modified independence;ambulating (3n1 over toilet) Pt Will Perform Toileting - Clothing Manipulation and hygiene: with modified independence;sit to/from stand  OT Frequency: Min 2X/week   Barriers to D/C:            Co-evaluation              End of Session Equipment Utilized During Treatment: Gait belt;Rolling walker  Activity Tolerance: Other (comment);No increased pain (nausea) Patient left: in chair;with call bell/phone within reach;with chair alarm set   Time: [id number] OT Time Calculation (min): 22 min Charges:  OT General Charges $OT Visit: 1 Procedure OT Evaluation $OT Eval Low Complexity: 1 Procedure G-Codes:    Hortencia Pilar 31-Mar-2016, 2:10 PM

## 2016-03-18 DIAGNOSIS — M9701XS Periprosthetic fracture around internal prosthetic right hip joint, sequela: Secondary | ICD-10-CM | POA: Diagnosis not present

## 2016-03-18 DIAGNOSIS — D62 Acute posthemorrhagic anemia: Secondary | ICD-10-CM | POA: Diagnosis not present

## 2016-03-18 DIAGNOSIS — I10 Essential (primary) hypertension: Secondary | ICD-10-CM | POA: Diagnosis not present

## 2016-03-18 DIAGNOSIS — R2681 Unsteadiness on feet: Secondary | ICD-10-CM | POA: Diagnosis not present

## 2016-03-18 DIAGNOSIS — K219 Gastro-esophageal reflux disease without esophagitis: Secondary | ICD-10-CM | POA: Diagnosis not present

## 2016-03-18 DIAGNOSIS — G4700 Insomnia, unspecified: Secondary | ICD-10-CM | POA: Diagnosis not present

## 2016-03-18 DIAGNOSIS — I480 Paroxysmal atrial fibrillation: Secondary | ICD-10-CM | POA: Diagnosis not present

## 2016-03-18 DIAGNOSIS — R509 Fever, unspecified: Secondary | ICD-10-CM | POA: Diagnosis not present

## 2016-03-18 DIAGNOSIS — Z967 Presence of other bone and tendon implants: Secondary | ICD-10-CM | POA: Diagnosis not present

## 2016-03-18 DIAGNOSIS — Z7901 Long term (current) use of anticoagulants: Secondary | ICD-10-CM | POA: Diagnosis not present

## 2016-03-18 DIAGNOSIS — R262 Difficulty in walking, not elsewhere classified: Secondary | ICD-10-CM | POA: Diagnosis not present

## 2016-03-18 DIAGNOSIS — M84359S Stress fracture, hip, unspecified, sequela: Secondary | ICD-10-CM | POA: Diagnosis not present

## 2016-03-18 DIAGNOSIS — M9701XD Periprosthetic fracture around internal prosthetic right hip joint, subsequent encounter: Secondary | ICD-10-CM | POA: Diagnosis not present

## 2016-03-18 DIAGNOSIS — M7989 Other specified soft tissue disorders: Secondary | ICD-10-CM | POA: Diagnosis not present

## 2016-03-18 DIAGNOSIS — Z471 Aftercare following joint replacement surgery: Secondary | ICD-10-CM | POA: Diagnosis not present

## 2016-03-18 DIAGNOSIS — R05 Cough: Secondary | ICD-10-CM | POA: Diagnosis not present

## 2016-03-18 DIAGNOSIS — E871 Hypo-osmolality and hyponatremia: Secondary | ICD-10-CM | POA: Diagnosis not present

## 2016-03-18 DIAGNOSIS — M6281 Muscle weakness (generalized): Secondary | ICD-10-CM | POA: Diagnosis not present

## 2016-03-18 DIAGNOSIS — D72829 Elevated white blood cell count, unspecified: Secondary | ICD-10-CM | POA: Diagnosis not present

## 2016-03-18 DIAGNOSIS — E119 Type 2 diabetes mellitus without complications: Secondary | ICD-10-CM | POA: Diagnosis not present

## 2016-03-18 DIAGNOSIS — R1313 Dysphagia, pharyngeal phase: Secondary | ICD-10-CM | POA: Diagnosis not present

## 2016-03-18 DIAGNOSIS — M25559 Pain in unspecified hip: Secondary | ICD-10-CM | POA: Diagnosis not present

## 2016-03-18 DIAGNOSIS — K5900 Constipation, unspecified: Secondary | ICD-10-CM | POA: Diagnosis not present

## 2016-03-18 DIAGNOSIS — F310 Bipolar disorder, current episode hypomanic: Secondary | ICD-10-CM | POA: Diagnosis not present

## 2016-03-18 DIAGNOSIS — Z8781 Personal history of (healed) traumatic fracture: Secondary | ICD-10-CM | POA: Diagnosis not present

## 2016-03-18 DIAGNOSIS — E039 Hypothyroidism, unspecified: Secondary | ICD-10-CM | POA: Diagnosis not present

## 2016-03-18 DIAGNOSIS — R1311 Dysphagia, oral phase: Secondary | ICD-10-CM | POA: Diagnosis not present

## 2016-03-18 LAB — BASIC METABOLIC PANEL
Anion gap: 10 (ref 5–15)
BUN: 23 mg/dL — ABNORMAL HIGH (ref 6–20)
CHLORIDE: 93 mmol/L — AB (ref 101–111)
CO2: 28 mmol/L (ref 22–32)
Calcium: 8.8 mg/dL — ABNORMAL LOW (ref 8.9–10.3)
Creatinine, Ser: 1.13 mg/dL — ABNORMAL HIGH (ref 0.44–1.00)
GFR calc non Af Amer: 41 mL/min — ABNORMAL LOW (ref >60)
GFR, EST AFRICAN AMERICAN: 48 mL/min — AB (ref >60)
Glucose, Bld: 113 mg/dL — ABNORMAL HIGH (ref 65–99)
POTASSIUM: 3.8 mmol/L (ref 3.5–5.1)
SODIUM: 131 mmol/L — AB (ref 135–145)

## 2016-03-18 LAB — CBC
HEMATOCRIT: 25.1 % — AB (ref 36.0–46.0)
HEMOGLOBIN: 8.3 g/dL — AB (ref 12.0–15.0)
MCH: 30 pg (ref 26.0–34.0)
MCHC: 33.1 g/dL (ref 30.0–36.0)
MCV: 90.6 fL (ref 78.0–100.0)
Platelets: 215 10*3/uL (ref 150–400)
RBC: 2.77 MIL/uL — AB (ref 3.87–5.11)
RDW: 14.8 % (ref 11.5–15.5)
WBC: 13.8 10*3/uL — ABNORMAL HIGH (ref 4.0–10.5)

## 2016-03-18 LAB — GLUCOSE, CAPILLARY
Glucose-Capillary: 125 mg/dL — ABNORMAL HIGH (ref 65–99)
Glucose-Capillary: 134 mg/dL — ABNORMAL HIGH (ref 65–99)

## 2016-03-18 MED ORDER — FENTANYL 25 MCG/HR TD PT72: 25.0000 ug | MEDICATED_PATCH | TRANSDERMAL | Status: DC

## 2016-03-18 NOTE — Discharge Planning (Signed)
Report called to [facility] at [facility].

## 2016-03-18 NOTE — Care Management Note (Signed)
Case Management Note  Patient Details  Name: Leah Holland MRN: [id number] Date of Birth: [birth year]  Subjective/Objective:                 S/p right hip hemi-arthroplasty   Action/Plan: PT/OT recommended SNF, referred to [facility]. Per CSW patient to discharge to [facility].   Expected Discharge Date:                  Expected Discharge Plan:  Skilled Nursing Facility  In-House Referral:  Clinical Social Work  Discharge planning Services  CM Consult  Post Acute Care Choice:    Choice offered to:     DME Arranged:    DME Agency:     HH Arranged:    [place] Agency:     Status of Service:  Completed, signed off  Medicare Important Message Given:    Date Medicare IM Given:    Medicare IM give by:    Date Additional Medicare IM Given:    Additional Medicare Important Message give by:     If discussed at [place] of Stay Meetings, dates discussed:    Additional Comments:  Nila Nephew, RN 03/18/2016, 10:04 AM

## 2016-03-18 NOTE — NC FL2 (Addendum)
[place] LEVEL OF CARE SCREENING TOOL     IDENTIFICATION  Patient Name: Leah Holland Birthdate: [birth year] Sex: female Admission Date (Current Location): 03/16/2016  [facility] and [place] Number:  Herbalist and Address:  [facility] [place], [place] [address] [place],  [address]      Provider Number: [id number]  Attending Physician Name and Address:  Louellen Molder, MD  Relative Name and Phone Number:       Current Level of Care: Hospital Recommended Level of Care: [place] Prior Approval Number:    Date Approved/Denied:   PASRR Number: [id number]  Discharge Plan: SNF    Current Diagnoses: Patient Active Problem List   Diagnosis Date Noted  . Periprosthetic fracture around internal prosthetic right hip joint ([place]), nonunion basicervical femoral neck with broken hardware and subtrochanteric fracture 03/16/2016  . Hip fx ([place]) 03/16/2016  . Preoperative clearance 03/08/2016  . Chronic anticoagulation 08/06/2015  . Bipolar disorder ([place]) 08/04/2015  . Hyponatremia 08/04/2015  . Constipation 08/04/2015  . Fracture, intertrochanteric, right femur ([place])   . S/P ORIF (open reduction internal fixation) fracture   . Complication of anesthesia 07/30/2015  . Diabetes mellitus type 2, diet-controlled ([place])   . Skin cancer of face   . Paroxysmal atrial fibrillation ([place]) 09/01/2014  . Right carotid bruit 11/18/2013  . Postoperative anemia due to acute blood loss 03/03/2012  . Hypertension   . Hyperlipidemia   . Swelling of extremity   . Cataract   . Hypothyroidism   . GERD (gastroesophageal reflux disease)   . Arthritis   . Insomnia     Orientation RESPIRATION BLADDER Height & Weight     Self, Time, Situation, Place  Normal Continent Weight: 138 lb (62.596 kg) Height:     BEHAVIORAL SYMPTOMS/MOOD NEUROLOGICAL BOWEL NUTRITION STATUS      Continent Diet (Please see discharge summary.)  AMBULATORY STATUS  COMMUNICATION OF NEEDS Skin   Limited Assist Verbally Surgical wounds                       Personal Care Assistance Level of Assistance  Bathing, Feeding, Dressing Bathing Assistance: Limited assistance Feeding assistance: Independent Dressing Assistance: Limited assistance     Functional Limitations Info             SPECIAL CARE FACTORS FREQUENCY  PT (By licensed PT), OT (By licensed OT)      5 days per week (PT/OT)              Contractures      Additional Factors Info  Code Status, Allergies Code Status Info: FULL Allergies Info: Morphine and related           Current Medications (03/18/2016):  This is the current hospital active medication list Current Facility-Administered Medications  Medication Dose Route Frequency Provider Last Rate Last Dose  . 0.9 %  sodium chloride infusion  75 mL/hr Intravenous Continuous Marchia Bond, MD 75 mL/hr at 03/16/16 2135 75 mL/hr at 03/16/16 2135  . acetaminophen (TYLENOL) tablet 650 mg  650 mg Oral Q6H PRN Marchia Bond, MD       Or  . acetaminophen (TYLENOL) suppository 650 mg  650 mg Rectal Q6H PRN Marchia Bond, MD      . alum & mag hydroxide-simeth (MAALOX/MYLANTA) 200-200-20 MG/5ML suspension 30 mL  30 mL Oral Q4H PRN Marchia Bond, MD      . apixaban (ELIQUIS) tablet 2.5 mg  2.5  mg Oral BID Marchia Bond, MD   2.5 mg at 03/18/16 1053  . atorvastatin (LIPITOR) tablet 80 mg  80 mg Oral Daily Marchia Bond, MD   80 mg at 03/18/16 1052  . benazepril (LOTENSIN) tablet 40 mg  40 mg Oral QPM Radene Gunning, NP   40 mg at 03/17/16 1817  . bisacodyl (DULCOLAX) suppository 10 mg  10 mg Rectal Daily PRN Radene Gunning, NP      . chlorthalidone (HYGROTON) tablet 25 mg  25 mg Oral Daily Radene Gunning, NP   25 mg at 03/18/16 1052  . diltiazem (CARDIZEM CD) 24 hr capsule 180 mg  180 mg Oral Daily Marchia Bond, MD   180 mg at 03/18/16 1052  . docusate sodium (COLACE) capsule 100 mg  100 mg Oral BID Marchia Bond, MD   100 mg at  03/18/16 1053  . fentaNYL (DURAGESIC - dosed mcg/hr) patch 25 mcg  25 mcg Transdermal Q3 days Marchia Bond, MD      . ferrous sulfate tablet 325 mg  325 mg Oral BID WC Radene Gunning, NP   325 mg at 03/18/16 0800  . FLUoxetine (PROZAC) capsule 40 mg  40 mg Oral Daily Radene Gunning, NP   40 mg at 03/18/16 1052  . HYDROcodone-acetaminophen (NORCO/VICODIN) 5-325 MG per tablet 1-2 tablet  1-2 tablet Oral Q6H PRN Marchia Bond, MD   2 tablet at 03/18/16 0533  . HYDROmorphone (DILAUDID) injection 0.5 mg  0.5 mg Intravenous Q2H PRN Marchia Bond, MD      . insulin aspart (novoLOG) injection 0-9 Units  0-9 Units Subcutaneous TID WC Radene Gunning, NP   1 Units at 03/18/16 [phone]  . levothyroxine (SYNTHROID, LEVOTHROID) tablet 125 mcg  125 mcg Oral QAC breakfast Radene Gunning, NP   125 mcg at 03/18/16 0724  . magnesium citrate solution 1 Bottle  1 Bottle Oral Once PRN Lezlie Octave Black, NP      . menthol-cetylpyridinium (CEPACOL) lozenge 3 mg  1 lozenge Oral PRN Marchia Bond, MD       Or  . phenol (CHLORASEPTIC) mouth spray 1 spray  1 spray Mouth/Throat PRN Marchia Bond, MD      . metoprolol (LOPRESSOR) tablet 50 mg  50 mg Oral BID Radene Gunning, NP   50 mg at 03/18/16 1053  . morphine 2 MG/ML injection 1 mg  1 mg Intravenous Q4H PRN Nishant Dhungel, MD      . ondansetron (ZOFRAN) tablet 4 mg  4 mg Oral Q6H PRN Marchia Bond, MD       Or  . ondansetron [facility]) injection 4 mg  4 mg Intravenous Q6H PRN Marchia Bond, MD   4 mg at 03/17/16 1030  . pantoprazole (PROTONIX) EC tablet 80 mg  80 mg Oral Q1200 Marchia Bond, MD   80 mg at 03/17/16 1817  . polyethylene glycol (MIRALAX / GLYCOLAX) packet 17 g  17 g Oral QODAY Marchia Bond, MD   17 g at 03/18/16 1055  . polyethylene glycol (MIRALAX / GLYCOLAX) packet 17 g  17 g Oral Daily PRN Marchia Bond, MD      . senna [facility]) tablet 8.6 mg  1 tablet Oral BID Marchia Bond, MD   8.6 mg at 03/17/16 2156  . sodium chloride flush (NS) 0.9 % injection 3 mL  3 mL  Intravenous Q12H Radene Gunning, NP   3 mL at 03/17/16 2157  . temazepam (RESTORIL) capsule 30 mg  30  mg Oral QHS PRN Radene Gunning, NP   30 mg at 03/17/16 2156  . Teriparatide (Recombinant) SOLN 20 mcg  20 mcg Subcutaneous Daily Marchia Bond, MD   20 mcg at 03/17/16 1856     Discharge Medications: Please see discharge summary for a list of discharge medications.  Relevant Imaging Results:  Relevant Lab Results:   Additional Information SSN: [id number]  Caroline Sauger, LCSW

## 2016-03-18 NOTE — Clinical Social Work Note (Signed)
Bundle patient from [place]. Patient to be discharged to [facility] SNF. EMS to transport patient at discharge.  RN report number: [place], [place] Orthopedics: [phone] Surgical: [phone]

## 2016-03-18 NOTE — Discharge Summary (Addendum)
Physician Discharge Summary  Leah Holland [id number] DOB: 1922-05-03 DOA: 03/16/2016  PCP: Anthoney Harada, MD  Admit date: 03/16/2016 Discharge date: 03/18/2016  Time spent: 30 minutes  Recommendations for Outpatient Follow-up:  -Discharge to skilled nursing facility for ongoing physical therapy. Recommend weightbearing as tolerated.  -Follow-up with Dr. [name] in 2 weeks  Discharge Diagnoses:  Principal Problem:   Periprosthetic fracture around internal prosthetic right hip joint ([place]), nonunion basicervical femoral  neck with broken hardware and subtrochanteric fracture   Active Problems:   Hypertension   Hyperlipidemia   Hypothyroidism   GERD (gastroesophageal reflux disease)   Paroxysmal atrial fibrillation (HCC)   Diabetes mellitus type 2, diet-controlled ([place])   Bipolar disorder ([place])   Discharge Condition: Fair  Diet recommendation: Diabetic  CODE STATUS: Full code  Filed Weights   03/18/16 0700  Weight: 62.596 kg (138 lb)    History of present illness:  Please refer to admission H&P for details, in brief,82 year old independent female resident of an assist living with history of hypertension, hypothyroidism, A. fib on Eliquis, diabetes mellitus presented with worsened chronic right hip pain. She had a hip fracture earlier this year and underwent IM nailing. This progressed into a nonunion and then broke her hardware. She was taken for an elective surgery but since her pain was severe and unable to bear weight or ambulate she was admitted to hospital service for surgery. Patient underwent removal of intramedullary nail with treatment of the intertrochanteric hip fracture nonunion with prosthetic right hip replacement.  Hospital Course:  Periprosthetic right hip fracture with nonunion of the femoral neck with broken hardware and subsequent trochanteric fracture -Status post surgical repair with a right hip hemiarthroplasty. Tolerated well. Foley  discontinued. Pain control with when necessary percocet .Patient also using  fentanyl patch prior to admission. -She already on Eliquis for A. Fib, does not need further DVT prophylaxis. Also is on baby aspirin. -Seen by physical therapy and recommended skilled nursing facility. -Patient is stable for discharge.  Postoperative blood loss anemia Continue iron supplement. Monitor H&H at the facility.  Paroxysmal A. fib Stable. Continue metoprolol for rate control. Eliquis resumed.   Diabetes mellitus Diet controlled. Stable.  Hypothyroidism Continue Synthroid  Essential Hypertension Stable. Continue home medications (metoprolol, Cardizem, Lasix and benazepril)  DVT prophylaxis: Eliquis Diet: Diabetic  Code Status: Full code Family Communication: None at bedside Disposition Plan: Skilled nursing facility possibly tomorrow.   Consultants:  Orthopedics (Dr. [name])  Procedures:  Prosthetic right hip replacement  Antibiotics:  None  Discharge Exam: Filed Vitals:   03/17/16 2208 03/18/16 0700  BP: 137/41 122/46  Pulse: 89 93  Temp: 98.2 F (36.8 C) 98.2 F (36.8 C)  Resp: 18 16     General: Elderly Female not in distress  HEENT: No pallor, moist mucosa, supple neck  Chest: Clear bilaterally  Cardiovascular: S1 and S2, no murmurs  GI: Soft, nondistended, nontender, bowel sounds present  Musculoskeletal: Warm, dressing over right hip  CNS: Alert and oriented Discharge Instructions   Discharge Instructions    Posterior total hip precautions    Complete by:  As directed      Weight bearing as tolerated    Complete by:  As directed           Current Discharge Medication List    CONTINUE these medications which have CHANGED   Details  fentaNYL (DURAGESIC - DOSED MCG/HR) 25 MCG/HR patch Place 1 patch (25 mcg total) onto the skin every 3 (three) days. Qty:  3 patch, Refills: 0    oxyCODONE-acetaminophen (PERCOCET/ROXICET) 5-325 MG tablet Take 1-2  tablets by mouth every 6 (six) hours as needed (pain). Qty: 30 tablet, Refills: 0      CONTINUE these medications which have NOT CHANGED   Details  aspirin EC 81 MG tablet Take 81 mg by mouth daily.    atorvastatin (LIPITOR) 80 MG tablet Take 80 mg by mouth daily.    benazepril (LOTENSIN) 40 MG tablet Take 1 tablet (40 mg total) by mouth every evening.    chlorthalidone (HYGROTON) 25 MG tablet TAKE 1 TABLET ONCE DAILY. Qty: 30 tablet, Refills: 10    diltiazem (CARDIZEM CD) 180 MG 24 hr capsule Take 1 capsule (180 mg total) by mouth daily. NEED OV. Qty: 30 capsule, Refills: 0    Esomeprazole Magnesium (NEXIUM PO) Take 22.3 mg by mouth daily before breakfast. OTC    Flaxseed, Linseed, (FLAXSEED OIL) 1000 MG CAPS 1 pill po bid Refills: 0    FLUoxetine (PROZAC) 40 MG capsule Take 40 mg by mouth daily.     furosemide (LASIX) 20 MG tablet Take 1 tablet (20 mg total) by mouth as needed for edema. Qty: 30 tablet, Refills: 5    metoprolol (LOPRESSOR) 50 MG tablet Take 50 mg by mouth 2 (two) times daily.    Multiple Vitamins-Minerals (MACULAR VITAMIN BENEFIT) TABS 1 pill by mouth twice a day. There is no dose for multivitamin.    polyethylene glycol (MIRALAX / GLYCOLAX) packet Take 17 g by mouth every other day. Mix in 8 oz water and drink    potassium chloride (K-DUR) 10 MEQ tablet Take 1 tablet (10 mEq total) by mouth as needed. Qty: 30 tablet, Refills: 5    SYNTHROID 125 MCG tablet TAKE 1 TABLET ONCE DAILY BEFORE BREAKFAST. Qty: 30 tablet, Refills: 2    temazepam (RESTORIL) 30 MG capsule Take 1 capsule (30 mg total) by mouth at bedtime as needed for sleep. Qty: 10 capsule, Refills: 0    Teriparatide, Recombinant, (FORTEO) 600 MCG/2.4ML SOLN Inject 20 mcg into the skin daily.    apixaban (ELIQUIS) 2.5 MG TABS tablet Take 1 tablet (2.5 mg total) by mouth 2 (two) times daily. Qty: 60 tablet, Refills: 0      STOP taking these medications     acetaminophen (TYLENOL) 325 MG  tablet      ferrous sulfate 325 (65 FE) MG tablet        Allergies  Allergen Reactions  . Morphine And Related Other (See Comments)    Made pt "wild"   Follow-up Information    Follow up with Johnny Bridge, MD. Schedule an appointment as soon as possible for a visit in 2 weeks.   Specialty:  Orthopedic Surgery   Contact information:   [address] [address] [phone]       Please follow up.   Why:  MD at SNF in 1 week       The results of significant diagnostics from this hospitalization (including imaging, microbiology, ancillary and laboratory) are listed below for reference.    Significant Diagnostic Studies: Pelvis Portable  03/16/2016  CLINICAL DATA:  Revision of right hip ORIF. EXAM: PORTABLE PELVIS 1-2 VIEWS COMPARISON:  CTA on 01/25/2016 FINDINGS: Frontal projection radiographs show placement of a bipolar hemiarthroplasty on the right which shows normal alignment. Mildly displaced trochanteric fragments remain. IMPRESSION: Normal alignment of right hip bipolar arthroplasty. Electronically Signed   By: Aletta Edouard M.D.   On:  03/16/2016 18:20   Nm Myocar Multi W/spect W/wall Motion / Ef  03/11/2016  CLINICAL DATA:  Preoperative clearance for hip surgery. Diabetes, hypertension, atrial fibrillation, and chest pain. EXAM: MYOCARDIAL IMAGING WITH SPECT (REST AND PHARMACOLOGIC-STRESS) GATED LEFT VENTRICULAR WALL MOTION STUDY LEFT VENTRICULAR EJECTION FRACTION TECHNIQUE: Standard myocardial SPECT imaging was performed after resting intravenous injection of 10 mCi Tc-103m sestamibi. Subsequently, intravenous infusion of Lexiscan was performed under the supervision of the Cardiology staff. At peak effect of the drug, 30 mCi Tc-82m sestamibi was injected intravenously and standard myocardial SPECT imaging was performed. Quantitative gated imaging was also performed to evaluate left ventricular wall motion, and estimate left ventricular ejection  fraction. COMPARISON:  None. FINDINGS: Perfusion: No decreased activity in the left ventricle on stress imaging to suggest reversible ischemia or infarction. Wall Motion: Normal left ventricular wall motion. No left ventricular dilation. Left Ventricular Ejection Fraction: 77 % End diastolic volume 73 ml End systolic volume 16 ml IMPRESSION: 1. No reversible ischemia or infarction. 2. Normal left ventricular wall motion. 3. Left ventricular ejection fraction 77% 4. Low-risk stress test findings*. *2012 Appropriate Use Criteria for Coronary Revascularization Focused Update: J Am Coll Cardiol. N6492421. http://content.airportbarriers.com.aspx?articleid=1201161 Electronically Signed   By: San Morelle M.D.   On: 03/11/2016 14:20   Dg Chest Port 1 View  03/16/2016  CLINICAL DATA:  Preop, right hip surgery EXAM: PORTABLE CHEST 1 VIEW COMPARISON:  07/30/2015 FINDINGS: Cardiomediastinal silhouette is stable. Mild hyperinflation again noted. Atherosclerotic calcifications of thoracic aorta. No infiltrate or pulmonary edema. IMPRESSION: No active disease. Electronically Signed   By: Lahoma Crocker M.D.   On: 03/16/2016 10:35    Microbiology: Recent Results (from the past 240 hour(s))  Surgical pcr screen     Status: None   Collection Time: 03/16/16  1:15 PM  Result Value Ref Range Status   MRSA, PCR NEGATIVE NEGATIVE Final   Staphylococcus aureus NEGATIVE NEGATIVE Final    Comment:        The Xpert SA Assay (FDA approved for NASAL specimens in patients over 65 years of age), is one component of a comprehensive surveillance program.  Test performance has been validated by [facility] for patients greater than or equal to 4 year old. It is not intended to diagnose infection nor to guide or monitor treatment.   Anaerobic culture     Status: None (Preliminary result)   Collection Time: 03/16/16  2:58 PM  Result Value Ref Range Status   Specimen Description TISSUE  Final   Special  Requests LEFT TROCHANTERIC BURSA  Final   Gram Stain   Final    NO WBC SEEN NO ORGANISMS SEEN Performed at [facility]    Culture PENDING  Incomplete   Report Status PENDING  Incomplete  Tissue culture     Status: None (Preliminary result)   Collection Time: 03/16/16  2:58 PM  Result Value Ref Range Status   Specimen Description TISSUE  Final   Special Requests LEFT TROCHANTERIC BURSA  Final   Gram Stain   Final    NO WBC SEEN NO ORGANISMS SEEN Performed at [facility]    Culture   Final    FEW GRAM NEGATIVE RODS Performed at [facility]    Report Status PENDING  Incomplete  Anaerobic culture     Status: None (Preliminary result)   Collection Time: 03/16/16  3:18 PM  Result Value Ref Range Status   Specimen Description TISSUE  Final   Special Requests DEEP  FEMUR  Final   Gram Stain   Final    NO WBC SEEN NO ORGANISMS SEEN Performed at [facility]    Culture PENDING  Incomplete   Report Status PENDING  Incomplete  Tissue culture     Status: None (Preliminary result)   Collection Time: 03/16/16  3:18 PM  Result Value Ref Range Status   Specimen Description TISSUE  Final   Special Requests DEEP FEMUR  Final   Gram Stain   Final    NO WBC SEEN NO ORGANISMS SEEN Performed at [facility]    Culture   Final    FEW GRAM NEGATIVE RODS Performed at [facility]    Report Status PENDING  Incomplete     Labs: Basic Metabolic Panel:  Recent Labs Lab 03/16/16 1028 03/17/16 0627 03/18/16 0338  NA 134* 133* 131*  K 3.6 3.8 3.8  CL 93* 94* 93*  CO2 29 26 28   GLUCOSE 109* 138* 113*  BUN 8 14 23*  CREATININE 0.68 0.94 1.13*  CALCIUM 9.9 8.5* 8.8*   Liver Function Tests:  Recent Labs Lab 03/16/16 1028  AST 19  ALT 19  ALKPHOS 141*  BILITOT 1.1  PROT 6.3*  ALBUMIN 3.6   No results for input(s): LIPASE, AMYLASE in the last 168 hours. No results for input(s): AMMONIA in the last 168 hours. CBC:  Recent  Labs Lab 03/16/16 1028 03/17/16 0627 03/18/16 0338  WBC 8.2 15.1* 13.8*  HGB 13.2 9.8* 8.3*  HCT 38.8 28.7* 25.1*  MCV 91.9 89.7 90.6  PLT 274 247 215   Cardiac Enzymes: No results for input(s): CKTOTAL, CKMB, CKMBINDEX, TROPONINI in the last 168 hours. BNP: BNP (last 3 results) No results for input(s): BNP in the last 8760 hours.  ProBNP (last 3 results) No results for input(s): PROBNP in the last 8760 hours.  CBG:  Recent Labs Lab 03/17/16 0655 03/17/16 1159 03/17/16 1637 03/17/16 2345 03/18/16 0706  GLUCAP 132* 127* 148* 142* 125*       Signed:  Louellen Molder MD.  [facility] 03/18/2016, 9:56 AM

## 2016-03-18 NOTE — Progress Notes (Signed)
Utilization review completed.  

## 2016-03-18 NOTE — Progress Notes (Signed)
[name], female   DOB: [birth year], 82 y.o.   MRN: [id number]     Subjective:  Patient reports pain as mild.  Patient in the bed and in no acute distress  Objective:   VITALS:   Filed Vitals:   03/17/16 1817 03/17/16 2208 03/18/16 0700 03/18/16 1052  BP: 139/50 137/41 122/46 124/50  Pulse:  89 93   Temp:  98.2 F (36.8 C) 98.2 F (36.8 C)   TempSrc:  Oral Oral   Resp:  18 16   Weight:   62.596 kg (138 lb)   SpO2:  95% 93%     ABD soft Sensation intact distally Dorsiflexion/Plantar flexion intact Incision: dressing C/D/I and no drainage   Lab Results  Component Value Date   WBC 13.8* 03/18/2016   HGB 8.3* 03/18/2016   HCT 25.1* 03/18/2016   MCV 90.6 03/18/2016   PLT 215 03/18/2016   BMET    Component Value Date/Time   NA 131* 03/18/2016 0338   K 3.8 03/18/2016 0338   CL 93* 03/18/2016 0338   CO2 28 03/18/2016 0338   GLUCOSE 113* 03/18/2016 0338   BUN 23* 03/18/2016 0338   CREATININE 1.13* 03/18/2016 0338   CALCIUM 8.8* 03/18/2016 0338   GFRNONAA 41* 03/18/2016 0338   GFRAA 48* 03/18/2016 0338     Assessment/Plan: 2 Days Post-Op   Principal Problem:   Periprosthetic fracture around internal prosthetic right hip joint ([place]), nonunion basicervical femoral neck with broken hardware and subtrochanteric fracture Active Problems:   Hypertension   Hyperlipidemia   Hypothyroidism   GERD (gastroesophageal reflux disease)   Paroxysmal atrial fibrillation ([place])   Diabetes mellitus type 2, diet-controlled ([place])   Bipolar disorder ([place])   Advance diet Up with therapy  ABLA monitoring Continue plan per medicine Okay for SNF WBAT Dry dressing PRN   Remonia Richter 03/18/2016, 11:53 AM   Marchia Bond, MD Cell [phone]

## 2016-03-18 NOTE — Care Management Important Message (Signed)
Important Message  Patient Details  Name: Leah Holland MRN: [id number] Date of Birth: 11/25/1922   Medicare Important Message Given:  Yes    Nathen May 03/18/2016, 10:41 AM

## 2016-03-18 NOTE — Progress Notes (Signed)
Physical Therapy Treatment Patient Details Name: Leah Holland MRN: [id number] DOB: January 22, 1922 Today's Date: 03/18/2016    History of Present Illness 82 yo admitted after fall with right femur fx s/p hip hemiarthroplasty. PMHx: fall, right hip fx Aug 2016, HTN, HLD, Afib, DM, bipolar, bil TKA    PT Comments    Pt continues to progress slowly with gait limited by fatigue. Pt able to recall 2/3 precautions with education for 3/3 and continues to need cues to follow with mobility. Pt educated for HEP, function and progression. Pillow between legs end of session.   Follow Up Recommendations  SNF;Supervision/Assistance - 24 hour     Equipment Recommendations       Recommendations for Other Services       Precautions / Restrictions Precautions Precautions: Fall;Posterior Hip Restrictions RLE Weight Bearing: Weight bearing as tolerated    Mobility  Bed Mobility   Bed Mobility: Supine to Sit     Supine to sit: Min guard     General bed mobility comments: guarding to maintain precautions throughout, no physical assist with bed flat  Transfers Overall transfer level: Needs assistance     Sit to Stand: Min guard;Min assist         General transfer comment: cues for hand placement and RLE position to maintain precautions with min assist to stand from bed, minguard from recliner  Ambulation/Gait Ambulation/Gait assistance: Min guard Ambulation Distance (Feet): 15 Feet Assistive device: Rolling walker (2 wheeled) Gait Pattern/deviations: Step-to pattern;Decreased stride length;Trunk flexed   Gait velocity interpretation: Below normal speed for age/gender General Gait Details: cues for sequence, posture, RW use, safety and to turn out RLE with turning right. Chair to follow as pt fatigues quickly. She walked 11' then 8'   Stairs            Wheelchair Mobility    Modified Rankin (Stroke Patients Only)       Balance                                     Cognition Arousal/Alertness: Awake/alert Behavior During Therapy: WFL for tasks assessed/performed         Memory: Decreased recall of precautions              Exercises Total Joint Exercises Heel Slides: AROM;Right;10 reps;Supine Hip ABduction/ADduction: AROM;Right;5 reps;Supine Long Arc Quad: AROM;Right;10 reps;Seated Marching in Standing: AROM;Seated;Right;10 reps    General Comments        Pertinent Vitals/Pain Pain Assessment: No/denies pain    Home Living                      Prior Function            PT Goals (current goals can now be found in the care plan section) Progress towards PT goals: Progressing toward goals    Frequency       PT Plan Current plan remains appropriate    Co-evaluation             End of Session Equipment Utilized During Treatment: Gait belt Activity Tolerance: Patient tolerated treatment well Patient left: with call bell/phone within reach;with chair alarm set;in chair     Time: 0732-0802 PT Time Calculation (min) (ACUTE ONLY): 30 min  Charges:  $Gait Training: 8-22 mins $Therapeutic Exercise: 8-22 mins  G CodesMelford Aase Mar 26, 2016, 8:05 AM Elwyn Reach, [place]

## 2016-03-19 LAB — TISSUE CULTURE
GRAM STAIN: NONE SEEN
GRAM STAIN: NONE SEEN

## 2016-03-21 ENCOUNTER — Non-Acute Institutional Stay (SKILLED_NURSING_FACILITY): Payer: Medicare Other | Admitting: Internal Medicine

## 2016-03-21 ENCOUNTER — Encounter: Payer: Self-pay | Admitting: Internal Medicine

## 2016-03-21 DIAGNOSIS — Z8781 Personal history of (healed) traumatic fracture: Secondary | ICD-10-CM | POA: Diagnosis not present

## 2016-03-21 DIAGNOSIS — Z7901 Long term (current) use of anticoagulants: Secondary | ICD-10-CM | POA: Diagnosis not present

## 2016-03-21 DIAGNOSIS — E119 Type 2 diabetes mellitus without complications: Secondary | ICD-10-CM | POA: Diagnosis not present

## 2016-03-21 DIAGNOSIS — Z967 Presence of other bone and tendon implants: Secondary | ICD-10-CM | POA: Diagnosis not present

## 2016-03-21 DIAGNOSIS — Z9889 Other specified postprocedural states: Secondary | ICD-10-CM

## 2016-03-21 DIAGNOSIS — I480 Paroxysmal atrial fibrillation: Secondary | ICD-10-CM

## 2016-03-21 DIAGNOSIS — I10 Essential (primary) hypertension: Secondary | ICD-10-CM | POA: Diagnosis not present

## 2016-03-21 DIAGNOSIS — M9701XS Periprosthetic fracture around internal prosthetic right hip joint, sequela: Secondary | ICD-10-CM | POA: Diagnosis not present

## 2016-03-21 DIAGNOSIS — M7989 Other specified soft tissue disorders: Secondary | ICD-10-CM

## 2016-03-21 DIAGNOSIS — D62 Acute posthemorrhagic anemia: Secondary | ICD-10-CM

## 2016-03-21 DIAGNOSIS — E039 Hypothyroidism, unspecified: Secondary | ICD-10-CM

## 2016-03-21 DIAGNOSIS — E871 Hypo-osmolality and hyponatremia: Secondary | ICD-10-CM | POA: Diagnosis not present

## 2016-03-21 LAB — CBC AND DIFFERENTIAL
HEMATOCRIT: 22 % — AB (ref 36–46)
Hemoglobin: 7.4 g/dL — AB (ref 12.0–16.0)
PLATELETS: 303 10*3/uL (ref 150–399)
WBC: 8.8 10^3/mL

## 2016-03-21 LAB — ANAEROBIC CULTURE
Gram Stain: NONE SEEN
Gram Stain: NONE SEEN

## 2016-03-21 LAB — BASIC METABOLIC PANEL
BUN: 27 mg/dL — AB (ref 4–21)
Creatinine: 0.8 mg/dL (ref 0.5–1.1)
Glucose: 99 mg/dL
Potassium: 3.9 mmol/L (ref 3.4–5.3)
Sodium: 134 mmol/L — AB (ref 137–147)

## 2016-03-21 NOTE — Progress Notes (Signed)
[name], female   DOB: November 15, 1922, 82 y.o.   MRN: [id number]   Comprehensive exam  Provider:  Jeanmarie Hubert, M.D. Location:  [facility] Room Number: N39 Place of Service:  SNF (31)  PCP: Estill Dooms, MD Patient Care Team: Estill Dooms, MD as PCP - General (Internal Medicine) Earlie Server, MD as Consulting Physician (Orthopedic Surgery) Lorretta Harp, MD as Consulting Physician (Cardiology) Calvert Cantor, MD as Consulting Physician (Ophthalmology) [name], MD as Attending Physician [facility])  Extended Emergency Contact Information Primary Emergency Contact: Ponciano Ort Address: [address]          [place], [place] [address] Johnnette Litter of [place] Phone: [phone] Relation: Son Secondary Emergency Contact: [place], [place] [address] Johnnette Litter of [place] Phone: [phone] Relation: Son  Code Status: DNR Goals of Care: Advanced Directive information Advanced Directives 04/07/2016  Does patient have an advance directive? -  Type of Advance Directive Out of facility DNR (pink MOST or yellow form)  Does patient want to make changes to advanced directive? -  Copy of advanced directive(s) in chart? -  Pre-existing out of facility DNR order (yellow form or pink MOST form) Yellow form placed in chart (order not valid for inpatient use)      Chief Complaint  Patient presents with  . New Admit To SNF    following hospitalization 03/16/16 to 03/18/16 fracture of right hip    HPI: Patient is a 82 y.o. female seen today for admission to [facility] SNF on 03/18/16 following hospitalization 03/16/16 to 03/18/16.Patient was hospitalized for an elective surgery because of a periprosthetic fracture at the right hip. Dr. Marchia Bond was the surgeon. She had open reduction internal fixation of intertrochanteric fracture. She had had previous surgery 07/30/2015. Revision surgery was necessary  due to intractable pain at the site of the prior surgery.  Patient has been on chronic anticoagulation for atrial fibrillation. She has gait instability. There is iron deficiency anemia.  Other chronic problems include hypothyroidism, hyperlipidemia, bipolar disorder, diabetes mellitus, depression, hypertension. She has a history of colon cancer. There have been issues with constipation. She has osteoporosis, dysphagia, right and left artificial knees, hiatal hernia, insomnia, and bipedal edema. The edema seems to be relatively new according to the patient.  Patient is admitted to SNF at [facility] for rehabilitation following her surgery. She will receive therapy directed at pain control, improve safe mobility, improved self-care. Anemia will be followed.  Past Medical History  Diagnosis Date  . Complication of anesthesia   . PONV (postoperative nausea and vomiting)   . Hypertension   . Hyperlipidemia   . Swelling of extremity     sees Dr. Gwenlyn Found, cardiologist 1 x year [phone]  . Bronchitis 12/2011  . Hypothyroidism     takes synthroid  . GERD (gastroesophageal reflux disease)   . Neuromuscular disorder ([place])     carpal tunnel in right hand  . Insomnia   . Hyperlipidemia   . Skin cancer of face 2000's    "one on each side of my face and one on my nose" (08/13/2013)  . Diabetes mellitus type 2, diet-controlled ([place])   . History of blood transfusion     "w/both knee OR's" (08/13/2013)  . H/O hiatal hernia   . Arthritis     "probably" (08/13/2013)  . Atrial fibrillation ([place])     new onset/pt  report 08/13/2013  . Depression   . Anxiety attack     "take RX prn" (08/13/2013)  . Intertrochanteric fracture of right hip ([place]) 07/30/2015  . Periprosthetic fracture around internal prosthetic right hip joint ([place]), nonunion basicervical femoral neck with broken hardware and subtrochanteric fracture 03/16/2016   Past Surgical History  Procedure Laterality Date  . Total knee arthroplasty  Right 2005  . Carpal tunnel release Right ~ 2005  . Eye surgery    . Breast biopsy Left ?1970's  . Cardiovascular stress test  12/07/11  . Doppler echocardiography  12/07/11  . Total knee arthroplasty  03/02/2012    Procedure: TOTAL KNEE ARTHROPLASTY;  Surgeon: Yvette Rack., MD;  Location: [facility];  Service: Orthopedics;  Laterality: Left;  . Joint replacement      bilateral knee replacement  . Cataract extraction, bilateral Bilateral 2000  . Skin cancer excision  2000's    "off my nose" (08/13/2013)  . Dilation and curettage of uterus  1950's  . Cardiovascular stress test  12/07/2011    LEXISCAN, normal scan, no significant wall abnormalities noted  . 2d echocardiogram  12/07/2004    EF 48%, moderate pulmonary hypertension  . Femur im nail Right 08/01/2015    Procedure: INTRAMEDULLARY (IM) NAIL FEMORAL;  Surgeon: Marchia Bond, MD;  Location: [facility];  Service: Orthopedics;  Laterality: Right;  . Total hip arthroplasty Right 03/16/2016    Procedure: TOTAL HIP ARTHROPLASTY;  Surgeon: Marchia Bond, MD;  Location: [facility];  Service: Orthopedics;  Laterality: Right;  . Hardware removal Right 03/16/2016    Procedure: HARDWARE REMOVAL;  Surgeon: Marchia Bond, MD;  Location: [facility];  Service: Orthopedics;  Laterality: Right;    reports that she has never smoked. She has never used smokeless tobacco. She reports that she does not drink alcohol or use illicit drugs. Social History   Social History  . Marital Status: Widowed    Spouse Name: N/A  . Number of Children: N/A  . Years of Education: N/A   Occupational History  . Not on file.   Social History Main Topics  . Smoking status: Never Smoker   . Smokeless tobacco: Never Used  . Alcohol Use: No  . Drug Use: No  . Sexual Activity: No   Other Topics Concern  . Not on file   Social History Narrative   Widowed. Lives at [facility]. Son died in the [place] War and she has not slept well since then.      Family History  Problem  Relation Age of Onset  . Arrhythmia Mother   . Heart disease Mother   . Arrhythmia Sister   . Arrhythmia Brother   . Heart disease Brother   . Stroke Father 32  . Heart disease Brother   . Heart disease Brother   . Cancer Brother     Colon cancer  . Heart disease Sister   . Heart disease Sister   . Stroke Sister   . Diabetes Sister   . Heart disease Child 54  . Heart disease Child 72    Health Maintenance  Topic Date Due  . FOOT EXAM  09/10/1934  . OPHTHALMOLOGY EXAM  09/10/1934  . TETANUS/TDAP  09/11/1943  . ZOSTAVAX  09/10/1984  . DEXA SCAN  09/10/1989  . PNA vac Low Risk Adult (1 of 2 - PCV13) 09/10/1989  . HEMOGLOBIN A1C  02/09/2016  . INFLUENZA VACCINE  07/12/2016    Allergies  Allergen Reactions  . Morphine  And Related Other (See Comments)    Made pt "wild"      Medication List       This list is accurate as of: 03/21/16  5:13 PM.  Always use your most recent med list.               apixaban 2.5 MG Tabs tablet  Commonly known as:  ELIQUIS  Take 1 tablet (2.5 mg total) by mouth 2 (two) times daily.     aspirin EC 81 MG tablet  Take 81 mg by mouth daily.     atorvastatin 80 MG tablet  Commonly known as:  LIPITOR  Take 80 mg by mouth daily.     benazepril 40 MG tablet  Commonly known as:  LOTENSIN  Take 1 tablet (40 mg total) by mouth every evening.     chlorthalidone 25 MG tablet  Commonly known as:  HYGROTON  TAKE 1 TABLET ONCE DAILY.     diltiazem 180 MG 24 hr capsule  Commonly known as:  CARDIZEM CD  Take 1 capsule (180 mg total) by mouth daily. NEED OV.     fentaNYL 25 MCG/HR patch  Commonly known as:  DURAGESIC - dosed mcg/hr  Place 1 patch (25 mcg total) onto the skin every 3 (three) days.     Flaxseed Oil 1000 MG Caps  1 pill po bid     FLUoxetine 40 MG capsule  Commonly known as:  PROZAC  Take 40 mg by mouth daily.     FORTEO 600 MCG/2.4ML Soln  Generic drug:  Teriparatide (Recombinant)  Inject 20 mcg into the skin daily.       furosemide 20 MG tablet  Commonly known as:  LASIX  Take 1 tablet (20 mg total) by mouth as needed for edema.     MACULAR VITAMIN BENEFIT Tabs  1 pill by mouth twice a day. There is no dose for multivitamin.     metoprolol 50 MG tablet  Commonly known as:  LOPRESSOR  Take 50 mg by mouth 2 (two) times daily.     NEXIUM PO  Take 22.3 mg by mouth daily before breakfast. OTC     oxyCODONE-acetaminophen 5-325 MG tablet  Commonly known as:  PERCOCET/ROXICET  Take 1-2 tablets by mouth every 6 (six) hours as needed (pain).     polyethylene glycol packet  Commonly known as:  MIRALAX / GLYCOLAX  Take 17 g by mouth every other day. Mix in 8 oz water and drink     potassium chloride 10 MEQ tablet  Commonly known as:  K-DUR  Take 1 tablet (10 mEq total) by mouth as needed.     SYNTHROID 125 MCG tablet  Generic drug:  levothyroxine  TAKE 1 TABLET ONCE DAILY BEFORE BREAKFAST.     temazepam 30 MG capsule  Commonly known as:  RESTORIL  Take 1 capsule (30 mg total) by mouth at bedtime as needed for sleep.        Review of Systems  Constitutional: Negative for fever, chills and diaphoresis.  HENT: Positive for hearing loss. Negative for congestion, ear discharge, ear pain, nosebleeds, sore throat and tinnitus.   Eyes: Negative for photophobia, pain, discharge and redness.  Respiratory: Negative for cough, shortness of breath, wheezing and stridor.   Cardiovascular: Positive for leg swelling. Negative for chest pain and palpitations.       History PAF with RVR. Trace edema RLE  Gastrointestinal: Positive for constipation. Negative for nausea, vomiting, abdominal pain, diarrhea and  blood in stool.       Hx of GERD and hiatal hernia.  Endocrine: Negative for polydipsia.  Genitourinary: Negative for dysuria, urgency, frequency, hematuria and flank pain.  Musculoskeletal: Negative for myalgias, back pain and neck pain.       Intertrochanteric fracture R femur 07/30/15. Subsequent open  reduction internal fixation with screws by Dr. Carter Kitten. Revision surgery 03/16/16 due to periprosthetic fracture.  Skin: Negative for rash.       R+L knee surgical scars, right hip surgical incision intact, mild erythema peri wound, right fore arm to elbow bruise and superficial laceration.   Allergic/Immunologic: Negative for environmental allergies.  Neurological: Negative for dizziness, tremors, seizures, weakness and headaches.  Hematological: Does not bruise/bleed easily.       Acute blood loss anemia following surgical repair of her right femur  Psychiatric/Behavioral: Negative for suicidal ideas and hallucinations. The patient is nervous/anxious.     Filed Vitals:   03/21/16 1711  BP: 121/76  Pulse: 98  Temp: 97.3 F (36.3 C)  Resp: 20  Height: 5\' 3"  (1.6 m)  Weight: 129 lb (58.514 kg)  SpO2: 94%   Body mass index is 22.86 kg/(m^2). Physical Exam  Constitutional: She is oriented to person, place, and time. She appears well-developed and well-nourished. No distress.  HENT:  Head: Normocephalic and atraumatic.  Right Ear: External ear normal.  Left Ear: External ear normal.  Nose: Nose normal.  Mouth/Throat: Oropharynx is clear and moist.  Eyes: Conjunctivae and EOM are normal. Pupils are equal, round, and reactive to light.  Corrective lenses  Neck: Normal range of motion. Neck supple. No JVD present. No tracheal deviation present. No thyromegaly present.  Cardiovascular: Normal rate, regular rhythm, normal heart sounds and intact distal pulses.   Pulmonary/Chest: Effort normal and breath sounds normal. No stridor. No respiratory distress. She has no wheezes. She has no rales. She exhibits no tenderness.  Abdominal: Bowel sounds are normal. She exhibits no distension and no mass. There is no tenderness. There is no rebound and no guarding. No hernia.  Genitourinary: Guaiac negative stool. No vaginal discharge found.  Musculoskeletal: She exhibits edema and tenderness.    RLE mild edema. R hip pain with ROM and weight bearing. Surgical incision site is doing well.  Lymphadenopathy:    She has no cervical adenopathy.  Neurological: She is alert and oriented to person, place, and time. She has normal reflexes. She displays normal reflexes. No cranial nerve deficit. She exhibits normal muscle tone. Coordination normal.  Skin: Skin is warm and dry. No rash noted. She is not diaphoretic. No erythema. No pallor.  Psychiatric: She has a normal mood and affect. Her behavior is normal. Judgment and thought content normal.  Insomnia, home med Temazepam nightly     Labs reviewed: Basic Metabolic Panel:  Recent Labs  03/16/16 1028 03/17/16 0627 03/18/16 0338  NA 134* 133* 131*  K 3.6 3.8 3.8  CL 93* 94* 93*  CO2 29 26 28   GLUCOSE 109* 138* 113*  BUN 8 14 23*  CREATININE 0.68 0.94 1.13*  CALCIUM 9.9 8.5* 8.8*   Liver Function Tests:  Recent Labs  07/30/15 1539 03/16/16 1028  AST 25 19  ALT 21 19  ALKPHOS 104 141*  BILITOT 1.5* 1.1  PROT 7.2 6.3*  ALBUMIN 4.3 3.6   No results for input(s): LIPASE, AMYLASE in the last 8760 hours. No results for input(s): AMMONIA in the last 8760 hours. CBC:  Recent Labs  07/30/15 1539  03/16/16 1028 03/17/16 0627 03/18/16 0338  WBC 12.4*  < > 8.2 15.1* 13.8*  NEUTROABS 10.2*  --   --   --   --   HGB 14.5  < > 13.2 9.8* 8.3*  HCT 42.8  < > 38.8 28.7* 25.1*  MCV 90.7  < > 91.9 89.7 90.6  PLT 187  < > 274 247 215  < > = values in this interval not displayed. Cardiac Enzymes: No results for input(s): CKTOTAL, CKMB, CKMBINDEX, TROPONINI in the last 8760 hours. BNP: Invalid input(s): POCBNP Lab Results  Component Value Date   HGBA1C 5.9 08/10/2015   Lab Results  Component Value Date   TSH 2.304 03/16/2016   Lab Results  Component Value Date   VITAMINB12 512 08/03/2015   Lab Results  Component Value Date   FOLATE 20.5 08/03/2015   Lab Results  Component Value Date   IRON 16* 08/03/2015   TIBC  238* 08/03/2015   FERRITIN 151 08/03/2015    Imaging and Procedures obtained prior to SNF admission: Nm Myocar Multi W/spect W/wall Motion / Ef  03/11/2016  CLINICAL DATA:  Preoperative clearance for hip surgery. Diabetes, hypertension, atrial fibrillation, and chest pain. EXAM: MYOCARDIAL IMAGING WITH SPECT (REST AND PHARMACOLOGIC-STRESS) GATED LEFT VENTRICULAR WALL MOTION STUDY LEFT VENTRICULAR EJECTION FRACTION TECHNIQUE: Standard myocardial SPECT imaging was performed after resting intravenous injection of 10 mCi Tc-48m sestamibi. Subsequently, intravenous infusion of Lexiscan was performed under the supervision of the Cardiology staff. At peak effect of the drug, 30 mCi Tc-53m sestamibi was injected intravenously and standard myocardial SPECT imaging was performed. Quantitative gated imaging was also performed to evaluate left ventricular wall motion, and estimate left ventricular ejection fraction. COMPARISON:  None. FINDINGS: Perfusion: No decreased activity in the left ventricle on stress imaging to suggest reversible ischemia or infarction. Wall Motion: Normal left ventricular wall motion. No left ventricular dilation. Left Ventricular Ejection Fraction: 77 % End diastolic volume 73 ml End systolic volume 16 ml IMPRESSION: 1. No reversible ischemia or infarction. 2. Normal left ventricular wall motion. 3. Left ventricular ejection fraction 77% 4. Low-risk stress test findings*. *2012 Appropriate Use Criteria for Coronary Revascularization Focused Update: J Am Coll Cardiol. N6492421. http://content.airportbarriers.com.aspx?articleid=1201161 Electronically Signed   By: San Morelle M.D.   On: 03/11/2016 14:20    Assessment/Plan 1. Periprosthetic fracture around internal prosthetic right hip joint, sequela Now status post surgical revision  2. S/P ORIF (open reduction internal fixation) fracture  3. Postoperative anemia due to acute blood loss -Follow-up CBC -Ferrous  sulfate 325 mg daily  4. Paroxysmal atrial fibrillation (HCC) Rate controlled . Continue anticoagulation  5. Diabetes mellitus type 2, diet-controlled (HCC) Monitor  6. Chronic anticoagulation Continue for paroxysmal atrial fibrillation  7. Essential hypertension Controlled  8. Hyponatremia Follow-up lab  9. Hypothyroidism, unspecified hypothyroidism type Follow-up lab  10. Swelling of extremity I believe related to recent surgery and fluids given at the hospital. Continue to monitor.

## 2016-03-23 ENCOUNTER — Encounter (HOSPITAL_COMMUNITY): Payer: Self-pay | Admitting: Orthopedic Surgery

## 2016-03-24 ENCOUNTER — Encounter: Payer: Self-pay | Admitting: Nurse Practitioner

## 2016-03-24 ENCOUNTER — Non-Acute Institutional Stay (SKILLED_NURSING_FACILITY): Payer: Medicare Other | Admitting: Nurse Practitioner

## 2016-03-24 DIAGNOSIS — M9701XS Periprosthetic fracture around internal prosthetic right hip joint, sequela: Secondary | ICD-10-CM | POA: Diagnosis not present

## 2016-03-24 DIAGNOSIS — K219 Gastro-esophageal reflux disease without esophagitis: Secondary | ICD-10-CM

## 2016-03-24 DIAGNOSIS — E039 Hypothyroidism, unspecified: Secondary | ICD-10-CM

## 2016-03-24 DIAGNOSIS — I480 Paroxysmal atrial fibrillation: Secondary | ICD-10-CM

## 2016-03-24 DIAGNOSIS — M7989 Other specified soft tissue disorders: Secondary | ICD-10-CM

## 2016-03-24 DIAGNOSIS — F310 Bipolar disorder, current episode hypomanic: Secondary | ICD-10-CM

## 2016-03-24 DIAGNOSIS — G4700 Insomnia, unspecified: Secondary | ICD-10-CM

## 2016-03-24 DIAGNOSIS — D62 Acute posthemorrhagic anemia: Secondary | ICD-10-CM | POA: Diagnosis not present

## 2016-03-24 DIAGNOSIS — K5900 Constipation, unspecified: Secondary | ICD-10-CM

## 2016-03-24 DIAGNOSIS — E871 Hypo-osmolality and hyponatremia: Secondary | ICD-10-CM

## 2016-03-24 DIAGNOSIS — I10 Essential (primary) hypertension: Secondary | ICD-10-CM

## 2016-03-24 LAB — CBC AND DIFFERENTIAL
HCT: 22 % — AB (ref 36–46)
Hemoglobin: 7.5 g/dL — AB (ref 12.0–16.0)
PLATELETS: 395 10*3/uL (ref 150–399)
WBC: 8.8 10*3/mL

## 2016-03-24 NOTE — Assessment & Plan Note (Signed)
Controlled, continue combination of Diltiazem 180mg , Metoprolol 50mg  bid, Benazepril 40mg  daily, Chlorthalidone 25mg , prn Furosemide 20mg  prn.

## 2016-03-24 NOTE — Assessment & Plan Note (Signed)
08/06/15 TSH 8.396 08/07/15 increase Levothyroxine to 162mcg update TSH

## 2016-03-24 NOTE — Assessment & Plan Note (Signed)
Stable, continue Nexium.  °

## 2016-03-24 NOTE — Assessment & Plan Note (Signed)
Continue Temazepam 30mg  daily

## 2016-03-24 NOTE — Assessment & Plan Note (Signed)
Here for rehab. Fentanyl for pain, prn Oxycodone/Acetaminophen available to her.

## 2016-03-24 NOTE — Assessment & Plan Note (Signed)
Mainly in the right leg, continue Chlorthalidone 25mg , Furosemide 20mg  daily prn, update CMP

## 2016-03-24 NOTE — Assessment & Plan Note (Signed)
Will increase MiraLax to daily, adding Senna S II hs.

## 2016-03-24 NOTE — Assessment & Plan Note (Signed)
Mood is stable, continue Prozac 40mg , Temazepam 30mg  hs prn

## 2016-03-24 NOTE — Assessment & Plan Note (Signed)
Heart rate is in controlled, continue Diltiazem 180mg , Metoprolol 50mg  bid

## 2016-03-24 NOTE — Assessment & Plan Note (Signed)
08/13/15 Hgb 8.4 03/21/16 Hgb 7.4, CBC in am 03/24/16 Hgb 7.5 Continue Fe 325 daily, CBC in one week.

## 2016-03-24 NOTE — Progress Notes (Signed)
[name], female   DOB: 08-23-1922, 82 y.o.   MRN: [id number]  Location:  [facility] Room Number: N 36 Place of Service:  SNF (31) Provider: Lennie Odor [place] NP  GREEN, Viviann Spare, MD  Patient Care Team: Estill Dooms, MD as PCP - General (Internal Medicine) Earlie Server, MD as Consulting Physician (Orthopedic Surgery) Lorretta Harp, MD as Consulting Physician (Cardiology) Calvert Cantor, MD as Consulting Physician (Ophthalmology) [name], MD as Attending Physician (Family Medicine)  Extended Emergency Contact Information Primary Emergency Contact: Ponciano Ort Address: [address]          [place], [place] [address] Johnnette Litter of [place] Phone: [phone] Relation: Son Secondary Emergency Contact: [place], [place] [address] Johnnette Litter of [place] Phone: [phone] Relation: Son  Code Status:  DNR Goals of care: Advanced Directive information Advanced Directives 03/24/2016  Does patient have an advance directive? Yes  Type of Advance Directive [place]  Does patient want to make changes to advanced directive? No - Patient declined  Copy of advanced directive(s) in chart? Yes  Pre-existing out of facility DNR order (yellow form or pink MOST form) -     Chief Complaint  Patient presents with  . Medical Management of Chronic Issues    Labs     HPI:  Pt is a 82 y.o. female seen today for an acute visit for post op anemia, Hgb 7.5 03/24/16, taking Fe, the right hip pain is managed with Fentanyl, prn Oxycodone/acetaminophin, here for rehab, goal is [place] when able, constipated, MiraLax qod is not adequate. Afib, blood pressure, heart rate is controlled on Enalapril, Diltiazem, Metoprolol, Chlorthalidone, prn Furosemide. Her mood is stable on Prozac, sleeps at night with aid of Temazepam 30mg .    Past Medical History  Diagnosis Date  . Complication of anesthesia   .  PONV (postoperative nausea and vomiting)   . Hypertension   . Hyperlipidemia   . Swelling of extremity     sees Dr. Gwenlyn Found, cardiologist 1 x year [phone]  . Bronchitis 12/2011  . Hypothyroidism     takes synthroid  . GERD (gastroesophageal reflux disease)   . Neuromuscular disorder ([place])     carpal tunnel in right hand  . Insomnia   . Hyperlipidemia   . Skin cancer of face 2000's    "one on each side of my face and one on my nose" (08/13/2013)  . Diabetes mellitus type 2, diet-controlled ([place])   . History of blood transfusion     "w/both knee OR's" (08/13/2013)  . H/O hiatal hernia   . Arthritis     "probably" (08/13/2013)  . Atrial fibrillation ([place])     new onset/pt report 08/13/2013  . Depression   . Anxiety attack     "take RX prn" (08/13/2013)  . Intertrochanteric fracture of right hip ([place]) 07/30/2015  . Periprosthetic fracture around internal prosthetic right hip joint ([place]), nonunion basicervical femoral neck with broken hardware and subtrochanteric fracture 03/16/2016   Past Surgical History  Procedure Laterality Date  . Total knee arthroplasty Right 2005  . Carpal tunnel release Right ~ 2005  . Eye surgery    . Breast biopsy Left ?1970's  . Cardiovascular stress test  12/07/11  . Doppler echocardiography  12/07/11  . Total knee arthroplasty  03/02/2012    Procedure: TOTAL KNEE ARTHROPLASTY;  Surgeon: Yvette Rack., MD;  Location: [facility];  Service: Orthopedics;  Laterality: Left;  . Joint replacement      bilateral knee replacement  . Cataract extraction, bilateral Bilateral 2000  . Skin cancer excision  2000's    "off my nose" (08/13/2013)  . Dilation and curettage of uterus  1950's  . Cardiovascular stress test  12/07/2011    LEXISCAN, normal scan, no significant wall abnormalities noted  . 2d echocardiogram  12/07/2004    EF 48%, moderate pulmonary hypertension  . Femur im nail Right 08/01/2015    Procedure: INTRAMEDULLARY (IM) NAIL FEMORAL;  Surgeon: Marchia Bond,  MD;  Location: [facility];  Service: Orthopedics;  Laterality: Right;  . Total hip arthroplasty Right 03/16/2016    Procedure: TOTAL HIP ARTHROPLASTY;  Surgeon: Marchia Bond, MD;  Location: [facility];  Service: Orthopedics;  Laterality: Right;  . Hardware removal Right 03/16/2016    Procedure: HARDWARE REMOVAL;  Surgeon: Marchia Bond, MD;  Location: [facility];  Service: Orthopedics;  Laterality: Right;    Allergies  Allergen Reactions  . Morphine And Related Other (See Comments)    Made pt "wild"      Medication List       This list is accurate as of: 03/24/16 11:59 PM.  Always use your most recent med list.               apixaban 2.5 MG Tabs tablet  Commonly known as:  ELIQUIS  Take 1 tablet (2.5 mg total) by mouth 2 (two) times daily.     aspirin EC 81 MG tablet  Take 81 mg by mouth daily.     atorvastatin 80 MG tablet  Commonly known as:  LIPITOR  Take 80 mg by mouth daily.     benazepril 40 MG tablet  Commonly known as:  LOTENSIN  Take 1 tablet (40 mg total) by mouth every evening.     chlorthalidone 25 MG tablet  Commonly known as:  HYGROTON  TAKE 1 TABLET ONCE DAILY.     diltiazem 180 MG 24 hr capsule  Commonly known as:  CARDIZEM CD  Take 1 capsule (180 mg total) by mouth daily. NEED OV.     fentaNYL 25 MCG/HR patch  Commonly known as:  DURAGESIC - dosed mcg/hr  Place 1 patch (25 mcg total) onto the skin every 3 (three) days.     ferrous sulfate 325 (65 FE) MG tablet  Take 325 mg by mouth daily with breakfast.     Flaxseed Oil 1000 MG Caps  1 pill po bid     FLUoxetine 40 MG capsule  Commonly known as:  PROZAC  Take 40 mg by mouth daily.     FORTEO 600 MCG/2.4ML Soln  Generic drug:  Teriparatide (Recombinant)  Inject 20 mcg into the skin daily.     furosemide 20 MG tablet  Commonly known as:  LASIX  Take 1 tablet (20 mg total) by mouth as needed for edema.     MACULAR VITAMIN BENEFIT Tabs  1 pill by mouth twice a day. There is no dose for multivitamin.       metoprolol 50 MG tablet  Commonly known as:  LOPRESSOR  Take 50 mg by mouth 2 (two) times daily.     NEXIUM PO  Take 22.3 mg by mouth daily before breakfast. OTC     oxyCODONE-acetaminophen 5-325 MG tablet  Commonly known as:  PERCOCET/ROXICET  Take 1-2 tablets by mouth every 6 (six) hours as needed (pain).  polyethylene glycol packet  Commonly known as:  MIRALAX / GLYCOLAX  Take 17 g by mouth every other day. Mix in 8 oz water and drink     potassium chloride 10 MEQ tablet  Commonly known as:  K-DUR  Take 1 tablet (10 mEq total) by mouth as needed.     Probiotic 250 MG Caps  Take 1 capsule by mouth 2 (two) times daily. Take while on Levaquin for 42 days     SYNTHROID 125 MCG tablet  Generic drug:  levothyroxine  TAKE 1 TABLET ONCE DAILY BEFORE BREAKFAST.     temazepam 30 MG capsule  Commonly known as:  RESTORIL  Take 1 capsule (30 mg total) by mouth at bedtime as needed for sleep.        Review of Systems  Constitutional: Negative for fever, chills and diaphoresis.  HENT: Positive for hearing loss. Negative for congestion, ear discharge, ear pain, nosebleeds, sore throat and tinnitus.   Eyes: Negative for photophobia, pain, discharge and redness.  Respiratory: Negative for cough, shortness of breath, wheezing and stridor.   Cardiovascular: Positive for leg swelling. Negative for chest pain and palpitations.       History PAF with RVR. 1+ edema RLE  Gastrointestinal: Positive for constipation. Negative for nausea, vomiting, abdominal pain, diarrhea and blood in stool.       Hx of GERD and hiatal hernia.  Endocrine: Negative for polydipsia.  Genitourinary: Negative for dysuria, urgency, frequency, hematuria and flank pain.  Musculoskeletal: Positive for arthralgias and gait problem. Negative for myalgias, back pain and neck pain.       Intertrochanteric fracture R femur 07/30/15. Subsequent open reduction internal fixation with screws by Dr. Carter Kitten. S/p the  right periprosthetic fx repair  Skin: Negative for rash.       R+L knee surgical scars, right hip surgical incision intact  Allergic/Immunologic: Negative for environmental allergies.  Neurological: Negative for dizziness, tremors, seizures, weakness and headaches.  Hematological: Does not bruise/bleed easily.       Acute blood loss anemia following surgical repair of her right hip  Psychiatric/Behavioral: Negative for suicidal ideas and hallucinations. The patient is nervous/anxious.     Immunization History  Administered Date(s) Administered  . Influenza Whole 03/05/2014   Pertinent  Health Maintenance Due  Topic Date Due  . FOOT EXAM  09/10/1934  . OPHTHALMOLOGY EXAM  09/10/1934  . DEXA SCAN  09/10/1989  . PNA vac Low Risk Adult (1 of 2 - PCV13) 09/10/1989  . HEMOGLOBIN A1C  02/09/2016  . INFLUENZA VACCINE  07/12/2016   Fall Risk  08/06/2015  Falls in the past year? Yes  Number falls in past yr: 1  Injury with Fall? Yes  Follow up Falls evaluation completed   Functional Status Survey:    Filed Vitals:   03/24/16 1442  BP: 130/60  Pulse: 78  Temp: 98.5 F (36.9 C)  TempSrc: Oral  Resp: 16  Height: 5\' 3"  (1.6 m)  Weight: 129 lb (58.514 kg)  SpO2: 96%   Body mass index is 22.86 kg/(m^2). Physical Exam  Constitutional: She is oriented to person, place, and time. She appears well-developed and well-nourished. No distress.  HENT:  Head: Normocephalic and atraumatic.  Right Ear: External ear normal.  Left Ear: External ear normal.  Nose: Nose normal.  Mouth/Throat: Oropharynx is clear and moist.  Eyes: Conjunctivae and EOM are normal. Pupils are equal, round, and reactive to light.  Corrective lenses  Neck: Normal range of motion. Neck  supple. No JVD present. No tracheal deviation present. No thyromegaly present.  Cardiovascular: Normal rate, regular rhythm, normal heart sounds and intact distal pulses.   Pulmonary/Chest: Effort normal and breath sounds normal. No  stridor. No respiratory distress. She has no wheezes. She has no rales. She exhibits no tenderness.  Abdominal: Bowel sounds are normal. She exhibits no distension and no mass. There is no tenderness. There is no rebound and no guarding. No hernia.  Genitourinary: Guaiac negative stool. No vaginal discharge found.  Musculoskeletal: She exhibits edema and tenderness.  RLE mild edema. R hip pain with ROM and weight bearing.   Lymphadenopathy:    She has no cervical adenopathy.  Neurological: She is alert and oriented to person, place, and time. She has normal reflexes. She displays normal reflexes. No cranial nerve deficit. She exhibits normal muscle tone. Coordination normal.  Skin: Skin is warm and dry. No rash noted. She is not diaphoretic. No erythema. No pallor.  Right hip surgical incision intact  Psychiatric: She has a normal mood and affect. Her behavior is normal. Judgment and thought content normal.  Insomnia, home med Temazepam nightly     Labs reviewed:  Recent Labs  03/16/16 1028 03/17/16 0627 03/18/16 0338 03/21/16  NA 134* 133* 131* 134*  K 3.6 3.8 3.8 3.9  CL 93* 94* 93*  --   CO2 29 26 28   --   GLUCOSE 109* 138* 113*  --   BUN 8 14 23* 27*  CREATININE 0.68 0.94 1.13* 0.8  CALCIUM 9.9 8.5* 8.8*  --     Recent Labs  07/30/15 1539 03/16/16 1028  AST 25 19  ALT 21 19  ALKPHOS 104 141*  BILITOT 1.5* 1.1  PROT 7.2 6.3*  ALBUMIN 4.3 3.6    Recent Labs  07/30/15 1539  03/16/16 1028 03/17/16 0627 03/18/16 0338 03/21/16 03/24/16  WBC 12.4*  < > 8.2 15.1* 13.8* 8.8 8.8  NEUTROABS 10.2*  --   --   --   --   --   --   HGB 14.5  < > 13.2 9.8* 8.3* 7.4* 7.5*  HCT 42.8  < > 38.8 28.7* 25.1* 22* 22*  MCV 90.7  < > 91.9 89.7 90.6  --   --   PLT 187  < > 274 247 215 303 395  < > = values in this interval not displayed. Lab Results  Component Value Date   TSH 2.304 03/16/2016   Lab Results  Component Value Date   HGBA1C 5.9 08/10/2015   Lab Results    Component Value Date   CHOL 116 04/26/2013   HDL 45 04/26/2013   LDLCALC 60 04/26/2013   TRIG 56 04/26/2013   CHOLHDL 2.6 04/26/2013    Significant Diagnostic Results in last 30 days:  Pelvis Portable  03/16/2016  CLINICAL DATA:  Revision of right hip ORIF. EXAM: PORTABLE PELVIS 1-2 VIEWS COMPARISON:  CTA on 01/25/2016 FINDINGS: Frontal projection radiographs show placement of a bipolar hemiarthroplasty on the right which shows normal alignment. Mildly displaced trochanteric fragments remain. IMPRESSION: Normal alignment of right hip bipolar arthroplasty. Electronically Signed   By: Aletta Edouard M.D.   On: 03/16/2016 18:20   Nm Myocar Multi W/spect W/wall Motion / Ef  03/11/2016  CLINICAL DATA:  Preoperative clearance for hip surgery. Diabetes, hypertension, atrial fibrillation, and chest pain. EXAM: MYOCARDIAL IMAGING WITH SPECT (REST AND PHARMACOLOGIC-STRESS) GATED LEFT VENTRICULAR WALL MOTION STUDY LEFT VENTRICULAR EJECTION FRACTION TECHNIQUE: Standard myocardial SPECT imaging was performed after  resting intravenous injection of 10 mCi Tc-37m sestamibi. Subsequently, intravenous infusion of Lexiscan was performed under the supervision of the Cardiology staff. At peak effect of the drug, 30 mCi Tc-87m sestamibi was injected intravenously and standard myocardial SPECT imaging was performed. Quantitative gated imaging was also performed to evaluate left ventricular wall motion, and estimate left ventricular ejection fraction. COMPARISON:  None. FINDINGS: Perfusion: No decreased activity in the left ventricle on stress imaging to suggest reversible ischemia or infarction. Wall Motion: Normal left ventricular wall motion. No left ventricular dilation. Left Ventricular Ejection Fraction: 77 % End diastolic volume 73 ml End systolic volume 16 ml IMPRESSION: 1. No reversible ischemia or infarction. 2. Normal left ventricular wall motion. 3. Left ventricular ejection fraction 77% 4. Low-risk stress test  findings*. *2012 Appropriate Use Criteria for Coronary Revascularization Focused Update: J Am Coll Cardiol. N6492421. http://content.airportbarriers.com.aspx?articleid=[email or web address] Electronically Signed   By: San Morelle M.D.   On: 03/11/2016 14:20   Dg Chest Port 1 View  03/16/2016  CLINICAL DATA:  Preop, right hip surgery EXAM: PORTABLE CHEST 1 VIEW COMPARISON:  07/30/2015 FINDINGS: Cardiomediastinal silhouette is stable. Mild hyperinflation again noted. Atherosclerotic calcifications of thoracic aorta. No infiltrate or pulmonary edema. IMPRESSION: No active disease. Electronically Signed   By: Lahoma Crocker M.D.   On: 03/16/2016 10:35    Assessment/Plan Bipolar disorder (HCC) Mood is stable, continue Prozac 40mg , Temazepam 30mg  hs prn  Constipation Will increase MiraLax to daily, adding Senna S II hs.   Postoperative anemia due to acute blood loss 08/13/15 Hgb 8.4 03/21/16 Hgb 7.4, CBC in am 03/24/16 Hgb 7.5 Continue Fe 325 daily, CBC in one week.    Swelling of extremity Mainly in the right leg, continue Chlorthalidone 25mg , Furosemide 20mg  daily prn, update CMP   Paroxysmal atrial fibrillation (HCC) Heart rate is in controlled, continue Diltiazem 180mg , Metoprolol 50mg  bid  Hypothyroidism 08/06/15 TSH 8.396 08/07/15 increase Levothyroxine to 115mcg update TSH  Insomnia Continue Temazepam 30mg  daily  GERD (gastroesophageal reflux disease) Stable, continue Nexium   Hypertension Controlled, continue combination of Diltiazem 180mg , Metoprolol 50mg  bid, Benazepril 40mg  daily, Chlorthalidone 25mg , prn Furosemide 20mg  prn.     Periprosthetic fracture around internal prosthetic right hip joint ([place]), nonunion basicervical femoral neck with broken hardware and subtrochanteric fracture Here for rehab. Fentanyl for pain, prn Oxycodone/Acetaminophen available to her.   Hyponatremia 03/21/16 Na 134 03/28/16 Na 133      Family/ staff Communication: rehab, [place] when able.    Labs: CBC, CMP, TSH done 03/28/16. CXR done 03/27/16

## 2016-03-29 NOTE — Assessment & Plan Note (Signed)
03/21/16 Na 134 03/28/16 Na 133

## 2016-04-04 DIAGNOSIS — R2681 Unsteadiness on feet: Secondary | ICD-10-CM | POA: Diagnosis not present

## 2016-04-04 DIAGNOSIS — M21751 Unequal limb length (acquired), right femur: Secondary | ICD-10-CM | POA: Diagnosis not present

## 2016-04-04 DIAGNOSIS — T8489XD Other specified complication of internal orthopedic prosthetic devices, implants and grafts, subsequent encounter: Secondary | ICD-10-CM | POA: Diagnosis not present

## 2016-04-04 DIAGNOSIS — S72141D Displaced intertrochanteric fracture of right femur, subsequent encounter for closed fracture with routine healing: Secondary | ICD-10-CM | POA: Diagnosis not present

## 2016-04-04 DIAGNOSIS — M6281 Muscle weakness (generalized): Secondary | ICD-10-CM | POA: Diagnosis not present

## 2016-04-04 DIAGNOSIS — Z4789 Encounter for other orthopedic aftercare: Secondary | ICD-10-CM | POA: Diagnosis not present

## 2016-04-04 DIAGNOSIS — M722 Plantar fascial fibromatosis: Secondary | ICD-10-CM | POA: Diagnosis not present

## 2016-04-04 DIAGNOSIS — Z471 Aftercare following joint replacement surgery: Secondary | ICD-10-CM | POA: Diagnosis not present

## 2016-04-05 ENCOUNTER — Ambulatory Visit: Payer: Medicare Other | Admitting: Cardiovascular Disease

## 2016-04-06 DIAGNOSIS — M21751 Unequal limb length (acquired), right femur: Secondary | ICD-10-CM | POA: Diagnosis not present

## 2016-04-06 DIAGNOSIS — R2681 Unsteadiness on feet: Secondary | ICD-10-CM | POA: Diagnosis not present

## 2016-04-06 DIAGNOSIS — M6281 Muscle weakness (generalized): Secondary | ICD-10-CM | POA: Diagnosis not present

## 2016-04-06 DIAGNOSIS — Z471 Aftercare following joint replacement surgery: Secondary | ICD-10-CM | POA: Diagnosis not present

## 2016-04-06 DIAGNOSIS — Z4789 Encounter for other orthopedic aftercare: Secondary | ICD-10-CM | POA: Diagnosis not present

## 2016-04-06 DIAGNOSIS — M722 Plantar fascial fibromatosis: Secondary | ICD-10-CM | POA: Diagnosis not present

## 2016-04-07 DIAGNOSIS — M1710 Unilateral primary osteoarthritis, unspecified knee: Secondary | ICD-10-CM | POA: Insufficient documentation

## 2016-04-07 DIAGNOSIS — M179 Osteoarthritis of knee, unspecified: Secondary | ICD-10-CM | POA: Insufficient documentation

## 2016-04-07 DIAGNOSIS — F329 Major depressive disorder, single episode, unspecified: Secondary | ICD-10-CM | POA: Insufficient documentation

## 2016-04-07 DIAGNOSIS — M810 Age-related osteoporosis without current pathological fracture: Secondary | ICD-10-CM | POA: Insufficient documentation

## 2016-04-07 DIAGNOSIS — F339 Major depressive disorder, recurrent, unspecified: Secondary | ICD-10-CM | POA: Insufficient documentation

## 2016-04-07 DIAGNOSIS — F4001 Agoraphobia with panic disorder: Secondary | ICD-10-CM | POA: Insufficient documentation

## 2016-04-08 DIAGNOSIS — M21751 Unequal limb length (acquired), right femur: Secondary | ICD-10-CM | POA: Diagnosis not present

## 2016-04-08 DIAGNOSIS — R2681 Unsteadiness on feet: Secondary | ICD-10-CM | POA: Diagnosis not present

## 2016-04-08 DIAGNOSIS — Z4789 Encounter for other orthopedic aftercare: Secondary | ICD-10-CM | POA: Diagnosis not present

## 2016-04-08 DIAGNOSIS — Z471 Aftercare following joint replacement surgery: Secondary | ICD-10-CM | POA: Diagnosis not present

## 2016-04-08 DIAGNOSIS — M722 Plantar fascial fibromatosis: Secondary | ICD-10-CM | POA: Diagnosis not present

## 2016-04-08 DIAGNOSIS — M6281 Muscle weakness (generalized): Secondary | ICD-10-CM | POA: Diagnosis not present

## 2016-04-11 DIAGNOSIS — Z471 Aftercare following joint replacement surgery: Secondary | ICD-10-CM | POA: Diagnosis not present

## 2016-04-11 DIAGNOSIS — R2681 Unsteadiness on feet: Secondary | ICD-10-CM | POA: Diagnosis not present

## 2016-04-11 DIAGNOSIS — M722 Plantar fascial fibromatosis: Secondary | ICD-10-CM | POA: Diagnosis not present

## 2016-04-11 DIAGNOSIS — Z4789 Encounter for other orthopedic aftercare: Secondary | ICD-10-CM | POA: Diagnosis not present

## 2016-04-11 DIAGNOSIS — M6281 Muscle weakness (generalized): Secondary | ICD-10-CM | POA: Diagnosis not present

## 2016-04-11 DIAGNOSIS — M21751 Unequal limb length (acquired), right femur: Secondary | ICD-10-CM | POA: Diagnosis not present

## 2016-04-12 DIAGNOSIS — Z471 Aftercare following joint replacement surgery: Secondary | ICD-10-CM | POA: Diagnosis not present

## 2016-04-12 DIAGNOSIS — M6281 Muscle weakness (generalized): Secondary | ICD-10-CM | POA: Diagnosis not present

## 2016-04-12 DIAGNOSIS — M21751 Unequal limb length (acquired), right femur: Secondary | ICD-10-CM | POA: Diagnosis not present

## 2016-04-12 DIAGNOSIS — R2681 Unsteadiness on feet: Secondary | ICD-10-CM | POA: Diagnosis not present

## 2016-04-12 DIAGNOSIS — Z4789 Encounter for other orthopedic aftercare: Secondary | ICD-10-CM | POA: Diagnosis not present

## 2016-04-12 DIAGNOSIS — M722 Plantar fascial fibromatosis: Secondary | ICD-10-CM | POA: Diagnosis not present

## 2016-04-13 ENCOUNTER — Other Ambulatory Visit: Payer: Self-pay | Admitting: Cardiovascular Disease

## 2016-04-13 ENCOUNTER — Telehealth: Payer: Self-pay | Admitting: Cardiovascular Disease

## 2016-04-13 DIAGNOSIS — Z4789 Encounter for other orthopedic aftercare: Secondary | ICD-10-CM | POA: Diagnosis not present

## 2016-04-13 DIAGNOSIS — M21751 Unequal limb length (acquired), right femur: Secondary | ICD-10-CM | POA: Diagnosis not present

## 2016-04-13 DIAGNOSIS — M6281 Muscle weakness (generalized): Secondary | ICD-10-CM | POA: Diagnosis not present

## 2016-04-13 DIAGNOSIS — R2681 Unsteadiness on feet: Secondary | ICD-10-CM | POA: Diagnosis not present

## 2016-04-13 DIAGNOSIS — M722 Plantar fascial fibromatosis: Secondary | ICD-10-CM | POA: Diagnosis not present

## 2016-04-13 DIAGNOSIS — Z471 Aftercare following joint replacement surgery: Secondary | ICD-10-CM | POA: Diagnosis not present

## 2016-04-13 NOTE — Telephone Encounter (Signed)
Tywan called in stating that the pt had developed rt lower extremitiy edema. She has been taking the Lasix 20mg  along with the Potassium supplement. It wasn't until today that the pt complained of having dizziness and the nurse wanted to know if some orders to check her electrolytes could be ordered. Please f/u with her  Thanks

## 2016-04-13 NOTE — Telephone Encounter (Signed)
SPOKE Leah Holland ( WILL BE THERE UNTIL 5:15  REOPEN AT 8:15 AM) - NURSE at [facility]- THE SWELLING IS THE AFFECTED LEG PATIENT IS ABLE BEAR WEIGHT  BUT HAS NOT BEEN WEIGHING DAILY  WILL DEFER TO DR Judithann Sauger

## 2016-04-13 NOTE — Telephone Encounter (Signed)
REFILL 

## 2016-04-13 NOTE — Telephone Encounter (Signed)
Needs to go back to see her orthopedic surgeon for evaluation

## 2016-04-13 NOTE — Telephone Encounter (Signed)
SPOKE TO TYWAN- INFORMATION GIVEN SHE STATES SHE WILL CONTACT DR [name]

## 2016-04-14 ENCOUNTER — Other Ambulatory Visit (HOSPITAL_COMMUNITY): Payer: Medicare Other

## 2016-04-18 DIAGNOSIS — Z471 Aftercare following joint replacement surgery: Secondary | ICD-10-CM | POA: Diagnosis not present

## 2016-04-18 DIAGNOSIS — Z4789 Encounter for other orthopedic aftercare: Secondary | ICD-10-CM | POA: Diagnosis not present

## 2016-04-18 DIAGNOSIS — R2681 Unsteadiness on feet: Secondary | ICD-10-CM | POA: Diagnosis not present

## 2016-04-18 DIAGNOSIS — M722 Plantar fascial fibromatosis: Secondary | ICD-10-CM | POA: Diagnosis not present

## 2016-04-18 DIAGNOSIS — M21751 Unequal limb length (acquired), right femur: Secondary | ICD-10-CM | POA: Diagnosis not present

## 2016-04-18 DIAGNOSIS — M6281 Muscle weakness (generalized): Secondary | ICD-10-CM | POA: Diagnosis not present

## 2016-04-20 DIAGNOSIS — R2681 Unsteadiness on feet: Secondary | ICD-10-CM | POA: Diagnosis not present

## 2016-04-20 DIAGNOSIS — Z471 Aftercare following joint replacement surgery: Secondary | ICD-10-CM | POA: Diagnosis not present

## 2016-04-20 DIAGNOSIS — M6281 Muscle weakness (generalized): Secondary | ICD-10-CM | POA: Diagnosis not present

## 2016-04-20 DIAGNOSIS — M21751 Unequal limb length (acquired), right femur: Secondary | ICD-10-CM | POA: Diagnosis not present

## 2016-04-20 DIAGNOSIS — Z4789 Encounter for other orthopedic aftercare: Secondary | ICD-10-CM | POA: Diagnosis not present

## 2016-04-20 DIAGNOSIS — M722 Plantar fascial fibromatosis: Secondary | ICD-10-CM | POA: Diagnosis not present

## 2016-04-22 DIAGNOSIS — M810 Age-related osteoporosis without current pathological fracture: Secondary | ICD-10-CM | POA: Diagnosis not present

## 2016-04-22 DIAGNOSIS — E559 Vitamin D deficiency, unspecified: Secondary | ICD-10-CM | POA: Diagnosis not present

## 2016-04-25 DIAGNOSIS — Z471 Aftercare following joint replacement surgery: Secondary | ICD-10-CM | POA: Diagnosis not present

## 2016-04-25 DIAGNOSIS — R2681 Unsteadiness on feet: Secondary | ICD-10-CM | POA: Diagnosis not present

## 2016-04-25 DIAGNOSIS — M722 Plantar fascial fibromatosis: Secondary | ICD-10-CM | POA: Diagnosis not present

## 2016-04-25 DIAGNOSIS — Z4789 Encounter for other orthopedic aftercare: Secondary | ICD-10-CM | POA: Diagnosis not present

## 2016-04-25 DIAGNOSIS — M21751 Unequal limb length (acquired), right femur: Secondary | ICD-10-CM | POA: Diagnosis not present

## 2016-04-25 DIAGNOSIS — M6281 Muscle weakness (generalized): Secondary | ICD-10-CM | POA: Diagnosis not present

## 2016-04-27 DIAGNOSIS — M6281 Muscle weakness (generalized): Secondary | ICD-10-CM | POA: Diagnosis not present

## 2016-04-27 DIAGNOSIS — Z4789 Encounter for other orthopedic aftercare: Secondary | ICD-10-CM | POA: Diagnosis not present

## 2016-04-27 DIAGNOSIS — M722 Plantar fascial fibromatosis: Secondary | ICD-10-CM | POA: Diagnosis not present

## 2016-04-27 DIAGNOSIS — R2681 Unsteadiness on feet: Secondary | ICD-10-CM | POA: Diagnosis not present

## 2016-04-27 DIAGNOSIS — M21751 Unequal limb length (acquired), right femur: Secondary | ICD-10-CM | POA: Diagnosis not present

## 2016-04-27 DIAGNOSIS — Z471 Aftercare following joint replacement surgery: Secondary | ICD-10-CM | POA: Diagnosis not present

## 2016-04-29 DIAGNOSIS — M6281 Muscle weakness (generalized): Secondary | ICD-10-CM | POA: Diagnosis not present

## 2016-04-29 DIAGNOSIS — Z4789 Encounter for other orthopedic aftercare: Secondary | ICD-10-CM | POA: Diagnosis not present

## 2016-04-29 DIAGNOSIS — M722 Plantar fascial fibromatosis: Secondary | ICD-10-CM | POA: Diagnosis not present

## 2016-04-29 DIAGNOSIS — Z471 Aftercare following joint replacement surgery: Secondary | ICD-10-CM | POA: Diagnosis not present

## 2016-04-29 DIAGNOSIS — M21751 Unequal limb length (acquired), right femur: Secondary | ICD-10-CM | POA: Diagnosis not present

## 2016-04-29 DIAGNOSIS — R2681 Unsteadiness on feet: Secondary | ICD-10-CM | POA: Diagnosis not present

## 2016-05-02 DIAGNOSIS — S72141D Displaced intertrochanteric fracture of right femur, subsequent encounter for closed fracture with routine healing: Secondary | ICD-10-CM | POA: Diagnosis not present

## 2016-05-04 DIAGNOSIS — M6281 Muscle weakness (generalized): Secondary | ICD-10-CM | POA: Diagnosis not present

## 2016-05-04 DIAGNOSIS — M21751 Unequal limb length (acquired), right femur: Secondary | ICD-10-CM | POA: Diagnosis not present

## 2016-05-04 DIAGNOSIS — Z471 Aftercare following joint replacement surgery: Secondary | ICD-10-CM | POA: Diagnosis not present

## 2016-05-04 DIAGNOSIS — R2681 Unsteadiness on feet: Secondary | ICD-10-CM | POA: Diagnosis not present

## 2016-05-04 DIAGNOSIS — Z4789 Encounter for other orthopedic aftercare: Secondary | ICD-10-CM | POA: Diagnosis not present

## 2016-05-04 DIAGNOSIS — M722 Plantar fascial fibromatosis: Secondary | ICD-10-CM | POA: Diagnosis not present

## 2016-05-06 DIAGNOSIS — M21751 Unequal limb length (acquired), right femur: Secondary | ICD-10-CM | POA: Diagnosis not present

## 2016-05-06 DIAGNOSIS — M6281 Muscle weakness (generalized): Secondary | ICD-10-CM | POA: Diagnosis not present

## 2016-05-06 DIAGNOSIS — Z4789 Encounter for other orthopedic aftercare: Secondary | ICD-10-CM | POA: Diagnosis not present

## 2016-05-06 DIAGNOSIS — Z471 Aftercare following joint replacement surgery: Secondary | ICD-10-CM | POA: Diagnosis not present

## 2016-05-06 DIAGNOSIS — M722 Plantar fascial fibromatosis: Secondary | ICD-10-CM | POA: Diagnosis not present

## 2016-05-06 DIAGNOSIS — R2681 Unsteadiness on feet: Secondary | ICD-10-CM | POA: Diagnosis not present

## 2016-05-09 DIAGNOSIS — M6281 Muscle weakness (generalized): Secondary | ICD-10-CM | POA: Diagnosis not present

## 2016-05-09 DIAGNOSIS — Z4789 Encounter for other orthopedic aftercare: Secondary | ICD-10-CM | POA: Diagnosis not present

## 2016-05-09 DIAGNOSIS — R2681 Unsteadiness on feet: Secondary | ICD-10-CM | POA: Diagnosis not present

## 2016-05-09 DIAGNOSIS — Z471 Aftercare following joint replacement surgery: Secondary | ICD-10-CM | POA: Diagnosis not present

## 2016-05-09 DIAGNOSIS — M722 Plantar fascial fibromatosis: Secondary | ICD-10-CM | POA: Diagnosis not present

## 2016-05-09 DIAGNOSIS — M21751 Unequal limb length (acquired), right femur: Secondary | ICD-10-CM | POA: Diagnosis not present

## 2016-05-11 DIAGNOSIS — M21751 Unequal limb length (acquired), right femur: Secondary | ICD-10-CM | POA: Diagnosis not present

## 2016-05-11 DIAGNOSIS — R2681 Unsteadiness on feet: Secondary | ICD-10-CM | POA: Diagnosis not present

## 2016-05-11 DIAGNOSIS — M6281 Muscle weakness (generalized): Secondary | ICD-10-CM | POA: Diagnosis not present

## 2016-05-11 DIAGNOSIS — Z4789 Encounter for other orthopedic aftercare: Secondary | ICD-10-CM | POA: Diagnosis not present

## 2016-05-11 DIAGNOSIS — Z471 Aftercare following joint replacement surgery: Secondary | ICD-10-CM | POA: Diagnosis not present

## 2016-05-11 DIAGNOSIS — M722 Plantar fascial fibromatosis: Secondary | ICD-10-CM | POA: Diagnosis not present

## 2016-05-13 DIAGNOSIS — M810 Age-related osteoporosis without current pathological fracture: Secondary | ICD-10-CM | POA: Diagnosis not present

## 2016-05-13 DIAGNOSIS — E559 Vitamin D deficiency, unspecified: Secondary | ICD-10-CM | POA: Diagnosis not present

## 2016-05-16 DIAGNOSIS — M722 Plantar fascial fibromatosis: Secondary | ICD-10-CM | POA: Diagnosis not present

## 2016-05-16 DIAGNOSIS — Z4789 Encounter for other orthopedic aftercare: Secondary | ICD-10-CM | POA: Diagnosis not present

## 2016-05-16 DIAGNOSIS — M21751 Unequal limb length (acquired), right femur: Secondary | ICD-10-CM | POA: Diagnosis not present

## 2016-05-16 DIAGNOSIS — R2681 Unsteadiness on feet: Secondary | ICD-10-CM | POA: Diagnosis not present

## 2016-05-16 DIAGNOSIS — Z471 Aftercare following joint replacement surgery: Secondary | ICD-10-CM | POA: Diagnosis not present

## 2016-05-16 DIAGNOSIS — M6281 Muscle weakness (generalized): Secondary | ICD-10-CM | POA: Diagnosis not present

## 2016-05-18 ENCOUNTER — Other Ambulatory Visit: Payer: Self-pay | Admitting: Cardiovascular Disease

## 2016-05-18 DIAGNOSIS — Z471 Aftercare following joint replacement surgery: Secondary | ICD-10-CM | POA: Diagnosis not present

## 2016-05-18 DIAGNOSIS — M722 Plantar fascial fibromatosis: Secondary | ICD-10-CM | POA: Diagnosis not present

## 2016-05-18 DIAGNOSIS — Z4789 Encounter for other orthopedic aftercare: Secondary | ICD-10-CM | POA: Diagnosis not present

## 2016-05-18 DIAGNOSIS — M6281 Muscle weakness (generalized): Secondary | ICD-10-CM | POA: Diagnosis not present

## 2016-05-18 DIAGNOSIS — R2681 Unsteadiness on feet: Secondary | ICD-10-CM | POA: Diagnosis not present

## 2016-05-18 DIAGNOSIS — M21751 Unequal limb length (acquired), right femur: Secondary | ICD-10-CM | POA: Diagnosis not present

## 2016-05-20 DIAGNOSIS — Z471 Aftercare following joint replacement surgery: Secondary | ICD-10-CM | POA: Diagnosis not present

## 2016-05-20 DIAGNOSIS — Z4789 Encounter for other orthopedic aftercare: Secondary | ICD-10-CM | POA: Diagnosis not present

## 2016-05-20 DIAGNOSIS — M21751 Unequal limb length (acquired), right femur: Secondary | ICD-10-CM | POA: Diagnosis not present

## 2016-05-20 DIAGNOSIS — R2681 Unsteadiness on feet: Secondary | ICD-10-CM | POA: Diagnosis not present

## 2016-05-20 DIAGNOSIS — M6281 Muscle weakness (generalized): Secondary | ICD-10-CM | POA: Diagnosis not present

## 2016-05-20 DIAGNOSIS — M722 Plantar fascial fibromatosis: Secondary | ICD-10-CM | POA: Diagnosis not present

## 2016-05-23 DIAGNOSIS — Z471 Aftercare following joint replacement surgery: Secondary | ICD-10-CM | POA: Diagnosis not present

## 2016-05-23 DIAGNOSIS — R2681 Unsteadiness on feet: Secondary | ICD-10-CM | POA: Diagnosis not present

## 2016-05-23 DIAGNOSIS — M21751 Unequal limb length (acquired), right femur: Secondary | ICD-10-CM | POA: Diagnosis not present

## 2016-05-23 DIAGNOSIS — M722 Plantar fascial fibromatosis: Secondary | ICD-10-CM | POA: Diagnosis not present

## 2016-05-23 DIAGNOSIS — Z4789 Encounter for other orthopedic aftercare: Secondary | ICD-10-CM | POA: Diagnosis not present

## 2016-05-23 DIAGNOSIS — M6281 Muscle weakness (generalized): Secondary | ICD-10-CM | POA: Diagnosis not present

## 2016-05-25 DIAGNOSIS — M21751 Unequal limb length (acquired), right femur: Secondary | ICD-10-CM | POA: Diagnosis not present

## 2016-05-25 DIAGNOSIS — M6281 Muscle weakness (generalized): Secondary | ICD-10-CM | POA: Diagnosis not present

## 2016-05-25 DIAGNOSIS — R2681 Unsteadiness on feet: Secondary | ICD-10-CM | POA: Diagnosis not present

## 2016-05-25 DIAGNOSIS — Z471 Aftercare following joint replacement surgery: Secondary | ICD-10-CM | POA: Diagnosis not present

## 2016-05-25 DIAGNOSIS — M722 Plantar fascial fibromatosis: Secondary | ICD-10-CM | POA: Diagnosis not present

## 2016-05-25 DIAGNOSIS — Z4789 Encounter for other orthopedic aftercare: Secondary | ICD-10-CM | POA: Diagnosis not present

## 2016-05-27 DIAGNOSIS — M21751 Unequal limb length (acquired), right femur: Secondary | ICD-10-CM | POA: Diagnosis not present

## 2016-05-27 DIAGNOSIS — R2681 Unsteadiness on feet: Secondary | ICD-10-CM | POA: Diagnosis not present

## 2016-05-27 DIAGNOSIS — Z471 Aftercare following joint replacement surgery: Secondary | ICD-10-CM | POA: Diagnosis not present

## 2016-05-27 DIAGNOSIS — M722 Plantar fascial fibromatosis: Secondary | ICD-10-CM | POA: Diagnosis not present

## 2016-05-27 DIAGNOSIS — Z4789 Encounter for other orthopedic aftercare: Secondary | ICD-10-CM | POA: Diagnosis not present

## 2016-05-27 DIAGNOSIS — M6281 Muscle weakness (generalized): Secondary | ICD-10-CM | POA: Diagnosis not present

## 2016-05-30 DIAGNOSIS — M810 Age-related osteoporosis without current pathological fracture: Secondary | ICD-10-CM | POA: Diagnosis not present

## 2016-05-30 DIAGNOSIS — E559 Vitamin D deficiency, unspecified: Secondary | ICD-10-CM | POA: Diagnosis not present

## 2016-05-31 DIAGNOSIS — M21751 Unequal limb length (acquired), right femur: Secondary | ICD-10-CM | POA: Diagnosis not present

## 2016-05-31 DIAGNOSIS — R2681 Unsteadiness on feet: Secondary | ICD-10-CM | POA: Diagnosis not present

## 2016-05-31 DIAGNOSIS — Z471 Aftercare following joint replacement surgery: Secondary | ICD-10-CM | POA: Diagnosis not present

## 2016-05-31 DIAGNOSIS — M722 Plantar fascial fibromatosis: Secondary | ICD-10-CM | POA: Diagnosis not present

## 2016-05-31 DIAGNOSIS — M6281 Muscle weakness (generalized): Secondary | ICD-10-CM | POA: Diagnosis not present

## 2016-05-31 DIAGNOSIS — Z4789 Encounter for other orthopedic aftercare: Secondary | ICD-10-CM | POA: Diagnosis not present

## 2016-06-01 DIAGNOSIS — R2681 Unsteadiness on feet: Secondary | ICD-10-CM | POA: Diagnosis not present

## 2016-06-01 DIAGNOSIS — M6281 Muscle weakness (generalized): Secondary | ICD-10-CM | POA: Diagnosis not present

## 2016-06-01 DIAGNOSIS — Z4789 Encounter for other orthopedic aftercare: Secondary | ICD-10-CM | POA: Diagnosis not present

## 2016-06-01 DIAGNOSIS — Z471 Aftercare following joint replacement surgery: Secondary | ICD-10-CM | POA: Diagnosis not present

## 2016-06-01 DIAGNOSIS — M21751 Unequal limb length (acquired), right femur: Secondary | ICD-10-CM | POA: Diagnosis not present

## 2016-06-01 DIAGNOSIS — M722 Plantar fascial fibromatosis: Secondary | ICD-10-CM | POA: Diagnosis not present

## 2016-06-02 DIAGNOSIS — L570 Actinic keratosis: Secondary | ICD-10-CM | POA: Diagnosis not present

## 2016-06-02 DIAGNOSIS — D485 Neoplasm of uncertain behavior of skin: Secondary | ICD-10-CM | POA: Diagnosis not present

## 2016-06-02 DIAGNOSIS — L821 Other seborrheic keratosis: Secondary | ICD-10-CM | POA: Diagnosis not present

## 2016-06-02 DIAGNOSIS — Z85828 Personal history of other malignant neoplasm of skin: Secondary | ICD-10-CM | POA: Diagnosis not present

## 2016-06-03 DIAGNOSIS — M6281 Muscle weakness (generalized): Secondary | ICD-10-CM | POA: Diagnosis not present

## 2016-06-03 DIAGNOSIS — R2681 Unsteadiness on feet: Secondary | ICD-10-CM | POA: Diagnosis not present

## 2016-06-03 DIAGNOSIS — M722 Plantar fascial fibromatosis: Secondary | ICD-10-CM | POA: Diagnosis not present

## 2016-06-03 DIAGNOSIS — M21751 Unequal limb length (acquired), right femur: Secondary | ICD-10-CM | POA: Diagnosis not present

## 2016-06-03 DIAGNOSIS — Z4789 Encounter for other orthopedic aftercare: Secondary | ICD-10-CM | POA: Diagnosis not present

## 2016-06-03 DIAGNOSIS — Z471 Aftercare following joint replacement surgery: Secondary | ICD-10-CM | POA: Diagnosis not present

## 2016-06-06 DIAGNOSIS — Z471 Aftercare following joint replacement surgery: Secondary | ICD-10-CM | POA: Diagnosis not present

## 2016-06-06 DIAGNOSIS — M722 Plantar fascial fibromatosis: Secondary | ICD-10-CM | POA: Diagnosis not present

## 2016-06-06 DIAGNOSIS — M6281 Muscle weakness (generalized): Secondary | ICD-10-CM | POA: Diagnosis not present

## 2016-06-06 DIAGNOSIS — R2681 Unsteadiness on feet: Secondary | ICD-10-CM | POA: Diagnosis not present

## 2016-06-06 DIAGNOSIS — M21751 Unequal limb length (acquired), right femur: Secondary | ICD-10-CM | POA: Diagnosis not present

## 2016-06-06 DIAGNOSIS — Z4789 Encounter for other orthopedic aftercare: Secondary | ICD-10-CM | POA: Diagnosis not present

## 2016-06-08 ENCOUNTER — Other Ambulatory Visit: Payer: Self-pay

## 2016-06-08 DIAGNOSIS — M722 Plantar fascial fibromatosis: Secondary | ICD-10-CM | POA: Diagnosis not present

## 2016-06-08 DIAGNOSIS — Z471 Aftercare following joint replacement surgery: Secondary | ICD-10-CM | POA: Diagnosis not present

## 2016-06-08 DIAGNOSIS — R2681 Unsteadiness on feet: Secondary | ICD-10-CM | POA: Diagnosis not present

## 2016-06-08 DIAGNOSIS — M6281 Muscle weakness (generalized): Secondary | ICD-10-CM | POA: Diagnosis not present

## 2016-06-08 DIAGNOSIS — M21751 Unequal limb length (acquired), right femur: Secondary | ICD-10-CM | POA: Diagnosis not present

## 2016-06-08 DIAGNOSIS — Z4789 Encounter for other orthopedic aftercare: Secondary | ICD-10-CM | POA: Diagnosis not present

## 2016-06-08 MED ORDER — FUROSEMIDE 20 MG PO TABS: 20.0000 mg | ORAL_TABLET | ORAL | Status: DC | PRN

## 2016-06-10 DIAGNOSIS — Z471 Aftercare following joint replacement surgery: Secondary | ICD-10-CM | POA: Diagnosis not present

## 2016-06-10 DIAGNOSIS — M21751 Unequal limb length (acquired), right femur: Secondary | ICD-10-CM | POA: Diagnosis not present

## 2016-06-10 DIAGNOSIS — R2681 Unsteadiness on feet: Secondary | ICD-10-CM | POA: Diagnosis not present

## 2016-06-10 DIAGNOSIS — M722 Plantar fascial fibromatosis: Secondary | ICD-10-CM | POA: Diagnosis not present

## 2016-06-10 DIAGNOSIS — Z4789 Encounter for other orthopedic aftercare: Secondary | ICD-10-CM | POA: Diagnosis not present

## 2016-06-10 DIAGNOSIS — M6281 Muscle weakness (generalized): Secondary | ICD-10-CM | POA: Diagnosis not present

## 2016-06-13 DIAGNOSIS — Z4789 Encounter for other orthopedic aftercare: Secondary | ICD-10-CM | POA: Diagnosis not present

## 2016-06-13 DIAGNOSIS — M21751 Unequal limb length (acquired), right femur: Secondary | ICD-10-CM | POA: Diagnosis not present

## 2016-06-13 DIAGNOSIS — R2681 Unsteadiness on feet: Secondary | ICD-10-CM | POA: Diagnosis not present

## 2016-06-13 DIAGNOSIS — Z471 Aftercare following joint replacement surgery: Secondary | ICD-10-CM | POA: Diagnosis not present

## 2016-06-13 DIAGNOSIS — M6281 Muscle weakness (generalized): Secondary | ICD-10-CM | POA: Diagnosis not present

## 2016-06-13 DIAGNOSIS — M722 Plantar fascial fibromatosis: Secondary | ICD-10-CM | POA: Diagnosis not present

## 2016-06-15 DIAGNOSIS — R2681 Unsteadiness on feet: Secondary | ICD-10-CM | POA: Diagnosis not present

## 2016-06-15 DIAGNOSIS — Z471 Aftercare following joint replacement surgery: Secondary | ICD-10-CM | POA: Diagnosis not present

## 2016-06-15 DIAGNOSIS — M722 Plantar fascial fibromatosis: Secondary | ICD-10-CM | POA: Diagnosis not present

## 2016-06-15 DIAGNOSIS — M21751 Unequal limb length (acquired), right femur: Secondary | ICD-10-CM | POA: Diagnosis not present

## 2016-06-15 DIAGNOSIS — Z4789 Encounter for other orthopedic aftercare: Secondary | ICD-10-CM | POA: Diagnosis not present

## 2016-06-15 DIAGNOSIS — M6281 Muscle weakness (generalized): Secondary | ICD-10-CM | POA: Diagnosis not present

## 2016-06-17 DIAGNOSIS — Z471 Aftercare following joint replacement surgery: Secondary | ICD-10-CM | POA: Diagnosis not present

## 2016-06-17 DIAGNOSIS — M6281 Muscle weakness (generalized): Secondary | ICD-10-CM | POA: Diagnosis not present

## 2016-06-17 DIAGNOSIS — M722 Plantar fascial fibromatosis: Secondary | ICD-10-CM | POA: Diagnosis not present

## 2016-06-17 DIAGNOSIS — R2681 Unsteadiness on feet: Secondary | ICD-10-CM | POA: Diagnosis not present

## 2016-06-17 DIAGNOSIS — M21751 Unequal limb length (acquired), right femur: Secondary | ICD-10-CM | POA: Diagnosis not present

## 2016-06-17 DIAGNOSIS — Z4789 Encounter for other orthopedic aftercare: Secondary | ICD-10-CM | POA: Diagnosis not present

## 2016-06-21 DIAGNOSIS — M810 Age-related osteoporosis without current pathological fracture: Secondary | ICD-10-CM | POA: Diagnosis not present

## 2016-06-21 DIAGNOSIS — R5383 Other fatigue: Secondary | ICD-10-CM | POA: Diagnosis not present

## 2016-06-21 DIAGNOSIS — E559 Vitamin D deficiency, unspecified: Secondary | ICD-10-CM | POA: Diagnosis not present

## 2016-06-28 ENCOUNTER — Other Ambulatory Visit: Payer: Self-pay

## 2016-06-28 MED ORDER — POTASSIUM CHLORIDE ER 10 MEQ PO TBCR: 10.0000 meq | EXTENDED_RELEASE_TABLET | ORAL | Status: DC | PRN

## 2016-07-04 ENCOUNTER — Other Ambulatory Visit: Payer: Self-pay | Admitting: Internal Medicine

## 2016-07-06 DIAGNOSIS — M810 Age-related osteoporosis without current pathological fracture: Secondary | ICD-10-CM | POA: Diagnosis not present

## 2016-07-06 DIAGNOSIS — E559 Vitamin D deficiency, unspecified: Secondary | ICD-10-CM | POA: Diagnosis not present

## 2016-07-11 DIAGNOSIS — S72141D Displaced intertrochanteric fracture of right femur, subsequent encounter for closed fracture with routine healing: Secondary | ICD-10-CM | POA: Diagnosis not present

## 2016-07-18 DIAGNOSIS — M779 Enthesopathy, unspecified: Secondary | ICD-10-CM | POA: Diagnosis not present

## 2016-07-18 DIAGNOSIS — Z96649 Presence of unspecified artificial hip joint: Secondary | ICD-10-CM | POA: Diagnosis not present

## 2016-07-18 DIAGNOSIS — D509 Iron deficiency anemia, unspecified: Secondary | ICD-10-CM | POA: Diagnosis not present

## 2016-07-18 DIAGNOSIS — I10 Essential (primary) hypertension: Secondary | ICD-10-CM | POA: Diagnosis not present

## 2016-07-18 DIAGNOSIS — Z792 Long term (current) use of antibiotics: Secondary | ICD-10-CM | POA: Diagnosis not present

## 2016-07-18 DIAGNOSIS — I4891 Unspecified atrial fibrillation: Secondary | ICD-10-CM | POA: Diagnosis not present

## 2016-07-18 DIAGNOSIS — E039 Hypothyroidism, unspecified: Secondary | ICD-10-CM | POA: Diagnosis not present

## 2016-07-18 DIAGNOSIS — E785 Hyperlipidemia, unspecified: Secondary | ICD-10-CM | POA: Diagnosis not present

## 2016-07-18 DIAGNOSIS — Z974 Presence of external hearing-aid: Secondary | ICD-10-CM | POA: Diagnosis not present

## 2016-08-22 ENCOUNTER — Encounter: Payer: Self-pay | Admitting: Infectious Disease

## 2016-08-22 ENCOUNTER — Ambulatory Visit (INDEPENDENT_AMBULATORY_CARE_PROVIDER_SITE_OTHER): Payer: Medicare Other | Admitting: Infectious Disease

## 2016-08-22 VITALS — BP 138/64 | HR 50 | Temp 97.7°F | Ht 63.0 in | Wt 122.0 lb

## 2016-08-22 DIAGNOSIS — Z967 Presence of other bone and tendon implants: Secondary | ICD-10-CM

## 2016-08-22 DIAGNOSIS — T8459XA Infection and inflammatory reaction due to other internal joint prosthesis, initial encounter: Secondary | ICD-10-CM

## 2016-08-22 DIAGNOSIS — M9701XA Periprosthetic fracture around internal prosthetic right hip joint, initial encounter: Secondary | ICD-10-CM

## 2016-08-22 DIAGNOSIS — A498 Other bacterial infections of unspecified site: Secondary | ICD-10-CM

## 2016-08-22 DIAGNOSIS — T847XXA Infection and inflammatory reaction due to other internal orthopedic prosthetic devices, implants and grafts, initial encounter: Secondary | ICD-10-CM

## 2016-08-22 DIAGNOSIS — Z23 Encounter for immunization: Secondary | ICD-10-CM

## 2016-08-22 DIAGNOSIS — Z9889 Other specified postprocedural states: Secondary | ICD-10-CM

## 2016-08-22 DIAGNOSIS — M6790 Unspecified disorder of synovium and tendon, unspecified site: Secondary | ICD-10-CM

## 2016-08-22 DIAGNOSIS — Z96649 Presence of unspecified artificial hip joint: Secondary | ICD-10-CM | POA: Insufficient documentation

## 2016-08-22 DIAGNOSIS — B964 Proteus (mirabilis) (morganii) as the cause of diseases classified elsewhere: Secondary | ICD-10-CM

## 2016-08-22 DIAGNOSIS — Z8781 Personal history of (healed) traumatic fracture: Secondary | ICD-10-CM | POA: Diagnosis not present

## 2016-08-22 HISTORY — DX: Presence of unspecified artificial hip joint: Z96.649

## 2016-08-22 HISTORY — DX: Infection and inflammatory reaction due to other internal joint prosthesis, initial encounter: T84.59XA

## 2016-08-22 HISTORY — DX: Unspecified disorder of synovium and tendon, unspecified site: M67.90

## 2016-08-22 HISTORY — DX: Other bacterial infections of unspecified site: A49.8

## 2016-08-22 HISTORY — DX: Infection and inflammatory reaction due to other internal orthopedic prosthetic devices, implants and grafts, initial encounter: T84.7XXA

## 2016-08-22 LAB — CBC WITH DIFFERENTIAL/PLATELET
Basophils Absolute: 0 cells/uL (ref 0–200)
Basophils Relative: 0 %
EOS PCT: 3 %
Eosinophils Absolute: 219 cells/uL (ref 15–500)
HCT: 36.8 % (ref 35.0–45.0)
HEMOGLOBIN: 12.2 g/dL (ref 11.7–15.5)
LYMPHS ABS: 2117 {cells}/uL (ref 850–3900)
Lymphocytes Relative: 29 %
MCH: 29.1 pg (ref 27.0–33.0)
MCHC: 33.2 g/dL (ref 32.0–36.0)
MCV: 87.8 fL (ref 80.0–100.0)
MONOS PCT: 9 %
MPV: 10.7 fL (ref 7.5–12.5)
Monocytes Absolute: 657 cells/uL (ref 200–950)
NEUTROS ABS: 4307 {cells}/uL (ref 1500–7800)
Neutrophils Relative %: 59 %
PLATELETS: 239 10*3/uL (ref 140–400)
RBC: 4.19 MIL/uL (ref 3.80–5.10)
RDW: 15.8 % — ABNORMAL HIGH (ref 11.0–15.0)
WBC: 7.3 10*3/uL (ref 3.8–10.8)

## 2016-08-22 LAB — BASIC METABOLIC PANEL WITH GFR
BUN: 19 mg/dL (ref 7–25)
CALCIUM: 9.8 mg/dL (ref 8.6–10.4)
CO2: 32 mmol/L — ABNORMAL HIGH (ref 20–31)
Chloride: 96 mmol/L — ABNORMAL LOW (ref 98–110)
Creat: 0.83 mg/dL (ref 0.60–0.88)
GFR, EST AFRICAN AMERICAN: 71 mL/min (ref >=60)
GFR, EST NON AFRICAN AMERICAN: 62 mL/min (ref >=60)
Glucose, Bld: 93 mg/dL (ref 65–99)
POTASSIUM: 3.8 mmol/L (ref 3.5–5.3)
SODIUM: 136 mmol/L (ref 135–146)

## 2016-08-22 LAB — SEDIMENTATION RATE: SED RATE: 4 mm/h (ref 0–30)

## 2016-08-22 MED ORDER — SULFAMETHOXAZOLE-TRIMETHOPRIM 400-80 MG PO TABS: 1.0000 | ORAL_TABLET | Freq: Two times a day (BID) | ORAL | 8 refills | Status: DC

## 2016-08-22 NOTE — Progress Notes (Signed)
[name], female   DOB: 26-Dec-1921, 82 y.o.   MRN: [id number] HPI: Leah Holland is a 82 y.o. female who is here for her eval of her hip infection.   Allergies: Allergies  Allergen Reactions  . Morphine And Related Other (See Comments)    Made pt "wild"    Vitals: Temp: 97.7 F (36.5 C) (09/11 1402) Temp Source: Oral (09/11 1402) BP: 138/64 (09/11 1402) Pulse Rate: 50 (09/11 1402)  Past Medical History: Past Medical History:  Diagnosis Date  . Anxiety attack    "take RX prn" (08/13/2013)  . Arthritis    "probably" (08/13/2013)  . Atrial fibrillation ([place])    new onset/pt report 08/13/2013  . Bronchitis 12/2011  . Complication of anesthesia   . Depression   . Diabetes mellitus type 2, diet-controlled ([place])   . GERD (gastroesophageal reflux disease)   . H/O hiatal hernia   . Hardware complicating wound infection ([facility]) 08/22/2016  . History of blood transfusion    "w/both knee OR's" (08/13/2013)  . Hyperlipidemia   . Hyperlipidemia   . Hypertension   . Hypothyroidism    takes synthroid  . Insomnia   . Intertrochanteric fracture of right hip ([place]) 07/30/2015  . Neuromuscular disorder ([place])    carpal tunnel in right hand  . Periprosthetic fracture around internal prosthetic right hip joint ([place]), nonunion basicervical femoral neck with broken hardware and subtrochanteric fracture 03/16/2016  . PONV (postoperative nausea and vomiting)   . 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 Proteus infection 08/22/2016  . Skin cancer of face 2000's   "one on each side of my face and one on my nose" (08/13/2013)  . Swelling of extremity    sees Dr. Gwenlyn Found, cardiologist 1 x year [phone]    Social History: Social History   Social History  . Marital status: Widowed    Spouse name: N/A  . Number of children: N/A  . Years of education: N/A   Social History Main Topics  . Smoking status: Never Smoker  . Smokeless tobacco: Never Used  . Alcohol use No  . Drug use:  No  . Sexual activity: No   Other Topics Concern  . None   Social History Narrative   Widowed. Lives at [facility]. Son died in the [place] War and she has not slept well since then.    Previous Regimen:   Current Regimen: Levaquin  Labs: No results found for: HIV1RNAQUANT, HIV1RNAVL, CD4TABS, HEPBSAB, HEPBSAG, HCVAB  CrCl: CrCl cannot be calculated (Patient's most recent lab result is older than the maximum 21 days allowed.).  Lipids:    Component Value Date/Time   CHOL 116 04/26/2013 2059   TRIG 56 04/26/2013 2059   HDL 45 04/26/2013 2059   CHOLHDL 2.6 04/26/2013 2059   VLDL 11 04/26/2013 2059   LDLCALC 60 04/26/2013 2059    Assessment: Leah Holland recently got a hip fracture and was repaired. She was dced to SNF for rehab. A tissue culture was obtained and came back with serratia. She was managed with levaquin. Dr. Tommy Medal decided to use septra instead. She is 82yo and weighs around 55kg. Her baseline scr ~1.13. Therefore, we are going to use a less aggressive dose of Septra. She will be f/u with Rx in 1-2 wks and we'll monitor her electrolytes and scr at that point. She will likely be on this therapy indefinitely.   Recommendations:  Septra SS 1 PO BID indefinitely F/u 1-2 wks with  Rx for renal and electrolytes monitoring Bmet  Wilfred Lacy, PharmD Clinical Infectious [facility] 08/22/2016, 2:56 PM

## 2016-08-22 NOTE — Progress Notes (Signed)
Reason for Consult: infected prosthetic hip  Requesting Physician: Dr. [name]   Subjective:    [name], female    DOB: 14-May-1922, 82 y.o.   MRN: [id number]  HPI  Leah Holland is a 82 year old lady with admission in April with right intertrochanteric proximal femur nonunion who progressed to ultimately break her intramedullary nail and elected for surgical management. She had removal of hardware including intramedullary nail, and treatment of intertrochanteric fracture with prosthesis placement. Dr. [name] got several intra-operative cultures due to concerrn for infection and two separate cultures grew serratia. Patient was placed on oral levaquin after discussion with me and has remained on this since then. She has done relatively well but recently began to complain of pain behind her prosthetic knees which she believed might be due to toxicity of fluoroquinolone to tendons after she and her son--who is a Pediatrician read about side effects of FQ.   I am actually skeptical about FQ toxicity here but I DO think she would be better served over the long run if she is to be on chronic suppressive antibiotics NOT being on a fluoroquinolone given risk for Clostridium difficile colitis in particular.  Past Medical History:  Diagnosis Date  . Anxiety attack    "take RX prn" (08/13/2013)  . Arthritis    "probably" (08/13/2013)  . Atrial fibrillation ([place])    new onset/pt report 08/13/2013  . Bronchitis 12/2011  . Complication of anesthesia   . Depression   . Diabetes mellitus type 2, diet-controlled ([place])   . GERD (gastroesophageal reflux disease)   . H/O hiatal hernia   . Hardware complicating wound infection ([place]) 08/22/2016  . History of blood transfusion    "w/both knee OR's" (08/13/2013)  . Hyperlipidemia   . Hyperlipidemia   . Hypertension   . Hypothyroidism    takes synthroid  . Insomnia   . Intertrochanteric fracture of right hip ([place]) 07/30/2015  . Neuromuscular  disorder ([place])    carpal tunnel in right hand  . Periprosthetic fracture around internal prosthetic right hip joint ([place]), nonunion basicervical femoral neck with broken hardware and subtrochanteric fracture 03/16/2016  . PONV (postoperative nausea and vomiting)   . 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 Proteus infection 08/22/2016  . Skin cancer of face 2000's   "one on each side of my face and one on my nose" (08/13/2013)  . Swelling of extremity    sees Dr. Gwenlyn Found, cardiologist 1 x year [phone]    Past Surgical History:  Procedure Laterality Date  . 2D ECHOCARDIOGRAM  12/07/2004   EF 48%, moderate pulmonary hypertension  . BREAST BIOPSY Left ?1970's  . CARDIOVASCULAR STRESS TEST  12/07/11  . CARDIOVASCULAR STRESS TEST  12/07/2011   LEXISCAN, normal scan, no significant wall abnormalities noted  . CARPAL TUNNEL RELEASE Right ~ 2005  . CATARACT EXTRACTION, BILATERAL Bilateral 2000  . DILATION AND CURETTAGE OF UTERUS  1950's  . DOPPLER ECHOCARDIOGRAPHY  12/07/11  . EYE SURGERY    . FEMUR IM NAIL Right 08/01/2015   Procedure: INTRAMEDULLARY (IM) NAIL FEMORAL;  Surgeon: Marchia Bond, MD;  Location: [facility];  Service: Orthopedics;  Laterality: Right;  . HARDWARE REMOVAL Right 03/16/2016   Procedure: HARDWARE REMOVAL;  Surgeon: Marchia Bond, MD;  Location: [facility];  Service: Orthopedics;  Laterality: Right;  . JOINT REPLACEMENT     bilateral knee replacement  . SKIN CANCER EXCISION  2000's   "off my nose" (08/13/2013)  . TOTAL HIP  ARTHROPLASTY Right 03/16/2016   Procedure: TOTAL HIP ARTHROPLASTY;  Surgeon: Marchia Bond, MD;  Location: [facility];  Service: Orthopedics;  Laterality: Right;  . TOTAL KNEE ARTHROPLASTY Right 2005  . TOTAL KNEE ARTHROPLASTY  03/02/2012   Procedure: TOTAL KNEE ARTHROPLASTY;  Surgeon: Yvette Rack., MD;  Location: [facility];  Service: Orthopedics;  Laterality: Left;    Family History  Problem Relation Age of Onset  . Arrhythmia Mother   . Heart disease Mother     . Arrhythmia Sister   . Arrhythmia Brother   . Heart disease Brother   . Stroke Father 14  . Heart disease Brother   . Heart disease Brother   . Cancer Brother     Colon cancer  . Heart disease Sister   . Heart disease Sister   . Stroke Sister   . Diabetes Sister   . Heart disease Child 35  . Heart disease Child 82      Social History   Social History  . Marital status: Widowed    Spouse name: N/A  . Number of children: N/A  . Years of education: N/A   Social History Main Topics  . Smoking status: Never Smoker  . Smokeless tobacco: Never Used  . Alcohol use No  . Drug use: No  . Sexual activity: No   Other Topics Concern  . None   Social History Narrative   Widowed. Lives at [facility]. Son died in the [place] War and she has not slept well since then.    Allergies  Allergen Reactions  . Morphine And Related Other (See Comments)    Made pt "wild"     Current Outpatient Prescriptions:  .  acetaminophen (TYLENOL) 325 MG tablet, Take 650 mg by mouth., Disp: , Rfl:  .  aspirin EC 81 MG tablet, Take 81 mg by mouth daily., Disp: , Rfl:  .  atorvastatin (LIPITOR) 80 MG tablet, Take 80 mg by mouth daily., Disp: , Rfl:  .  benazepril (LOTENSIN) 40 MG tablet, Take 1 tablet (40 mg total) by mouth every evening., Disp: , Rfl:  .  chlorthalidone (HYGROTON) 25 MG tablet, TAKE 1 TABLET ONCE DAILY., Disp: 30 tablet, Rfl: 5 .  denosumab (PROLIA) 60 MG/ML SOLN injection, Inject 60 mg into the skin every 6 (six) months. Administer in upper arm, thigh, or abdomen, Disp: , Rfl:  .  diltiazem (CARDIZEM CD) 180 MG 24 hr capsule, TAKE 1 CAPSULE DAILY., Disp: 30 capsule, Rfl: 5 .  ELIQUIS 2.5 MG TABS tablet, TAKE 1 TABLET TWICE DAILY., Disp: 60 tablet, Rfl: 5 .  Esomeprazole Magnesium (NEXIUM PO), Take 22.3 mg by mouth daily before breakfast. OTC, Disp: , Rfl:  .  Flaxseed, Linseed, (FLAXSEED OIL) 1000 MG CAPS, 1 pill po bid (Patient taking differently: Take 1,000 mg by mouth  2 (two) times daily. ), Disp: , Rfl: 0 .  FLUoxetine (PROZAC) 40 MG capsule, Take 40 mg by mouth daily. , Disp: , Rfl:  .  furosemide (LASIX) 20 MG tablet, Take 1 tablet (20 mg total) by mouth as needed for edema., Disp: 30 tablet, Rfl: 5 .  metoprolol (LOPRESSOR) 50 MG tablet, Take 50 mg by mouth 2 (two) times daily., Disp: , Rfl:  .  Multiple Vitamins-Minerals (MACULAR VITAMIN BENEFIT) TABS, 1 pill by mouth twice a day. There is no dose for multivitamin. (Patient taking differently: Take 1 tablet by mouth 2 (two) times daily. Macular Protect), Disp: , Rfl:  .  oxyCODONE-acetaminophen (  PERCOCET/ROXICET) 5-325 MG tablet, Take 1-2 tablets by mouth every 6 (six) hours as needed (pain)., Disp: 30 tablet, Rfl: 0 .  polyethylene glycol (MIRALAX / GLYCOLAX) packet, Take 17 g by mouth every other day. Mix in 8 oz water and drink, Disp: , Rfl:  .  potassium chloride (K-DUR) 10 MEQ tablet, Take 1 tablet (10 mEq total) by mouth as needed., Disp: 30 tablet, Rfl: 5 .  SYNTHROID 125 MCG tablet, TAKE 1 TABLET ONCE DAILY BEFORE BREAKFAST., Disp: 30 tablet, Rfl: 2 .  temazepam (RESTORIL) 30 MG capsule, Take 1 capsule (30 mg total) by mouth at bedtime as needed for sleep., Disp: 10 capsule, Rfl: 0 .  fluorouracil (EFUDEX) 5 % cream, , Disp: , Rfl:  .  sulfamethoxazole-trimethoprim (BACTRIM) 400-80 MG tablet, Take 1 tablet by mouth 2 (two) times daily., Disp: 60 tablet, Rfl: 8   Review of Systems  Constitutional: Negative for activity change, appetite change, chills, diaphoresis and fever.  HENT: Negative for congestion and sore throat.   Respiratory: Negative for cough and wheezing.   Cardiovascular: Negative for leg swelling.  Gastrointestinal: Negative for blood in stool, nausea and vomiting.  Genitourinary: Negative for dysuria, flank pain and hematuria.  Musculoskeletal: Positive for myalgias. Negative for back pain.  Skin: Negative for rash.  Neurological: Negative for dizziness, weakness and headaches.   Hematological: Does not bruise/bleed easily.  Psychiatric/Behavioral: The patient is not nervous/anxious.        Objective:   Physical Exam  Constitutional: She is oriented to person, place, and time. She appears well-developed and well-nourished. No distress.  HENT:  Head: Normocephalic and atraumatic.  Mouth/Throat: No oropharyngeal exudate.  Eyes: Conjunctivae and EOM are normal. No scleral icterus.  Neck: Normal range of motion. Neck supple.  Cardiovascular: Normal rate and regular rhythm.   Pulmonary/Chest: Effort normal. No respiratory distress. She has no wheezes.  Abdominal: She exhibits no distension.  Musculoskeletal: She exhibits no edema or tenderness.  Neurological: She is alert and oriented to person, place, and time. She exhibits normal muscle tone. Coordination normal.  Skin: Skin is warm and dry. No rash noted. She is not diaphoretic. No erythema.  Psychiatric: She has a normal mood and affect. Her behavior is normal. Judgment normal.          Assessment & Plan:    Prosthetic hip infection: we did have several cases of serratia contaminating surgical specimens due to contaminated saline used in prep of SOME cultures done here at [facility]. I will review this specific case with Dr. Baxter Flattery. Given the fracture however and concern for infection having played a role I am going to have a very difficult time talking myself out of treating this patient with chronic antibiotics esp give the consequences of failure to suppress an infection at this site  We will check ESR, CRP, CMP and CBC c diff   We will switch to Bactrim SS BID and recheck BMP in 1-2 weeks when pt will meet with my pharmacist Onnie Boer.  ? FQ toxicity: I am skeptical of this but lets see how she does off of the FQ and I would prefer this anyway given risk for CDI   We spent greater than 60 minutes with the patient including greater than 50% of time in face to face counsel of the patient and her son re  prosthetic hip infection with serratia, risk for FQ toxicities including tendonapathies, CDI, risks of bactrim including pseuodo and actual hyperkalemia and renal dysfunction and in coordination  of her care.

## 2016-08-22 NOTE — Patient Instructions (Signed)
We will STOP levaquin and check labs today and start Bactrim (TMP/SMX)  I would like you to come and see Leah Holland 1-2 weeks after starting the Bactrim to recheck your potassium and kidney function  I will have you come back to see me in next 2 months

## 2016-08-23 LAB — C-REACTIVE PROTEIN: CRP: 0.2 mg/L (ref <8.0)

## 2016-09-05 DIAGNOSIS — E559 Vitamin D deficiency, unspecified: Secondary | ICD-10-CM | POA: Diagnosis not present

## 2016-09-05 DIAGNOSIS — S72141D Displaced intertrochanteric fracture of right femur, subsequent encounter for closed fracture with routine healing: Secondary | ICD-10-CM | POA: Diagnosis not present

## 2016-09-05 DIAGNOSIS — M810 Age-related osteoporosis without current pathological fracture: Secondary | ICD-10-CM | POA: Diagnosis not present

## 2016-09-06 ENCOUNTER — Ambulatory Visit (INDEPENDENT_AMBULATORY_CARE_PROVIDER_SITE_OTHER): Payer: Medicare Other | Admitting: Cardiovascular Disease

## 2016-09-06 ENCOUNTER — Encounter: Payer: Self-pay | Admitting: Cardiovascular Disease

## 2016-09-06 VITALS — BP 124/60 | HR 49 | Ht 63.0 in | Wt 125.0 lb

## 2016-09-06 DIAGNOSIS — I480 Paroxysmal atrial fibrillation: Secondary | ICD-10-CM | POA: Diagnosis not present

## 2016-09-06 DIAGNOSIS — E785 Hyperlipidemia, unspecified: Secondary | ICD-10-CM | POA: Diagnosis not present

## 2016-09-06 DIAGNOSIS — I10 Essential (primary) hypertension: Secondary | ICD-10-CM | POA: Diagnosis not present

## 2016-09-06 NOTE — Patient Instructions (Signed)
Medication Instructions:  NO CHANGES.   Follow-Up: Your physician wants you to follow-up in: 12 MONTHS WITH DR BERRY.  You will receive a reminder letter in the mail two months in advance. If you don't receive a letter, please call our office to schedule the follow-up appointment.   If you need a refill on your cardiac medications before your next appointment, please call your pharmacy.   

## 2016-09-06 NOTE — Assessment & Plan Note (Signed)
History of paroxysmal atrial fibrillation maintaining sinus rhythm on diltiazem and low-dose Eliquis

## 2016-09-06 NOTE — Progress Notes (Signed)
09/06/2016 Leah Holland   12/29/1921  [id number]  Primary Physician Jeanmarie Hubert, MD Primary Cardiologist: Lorretta Harp MD Gar[facility], [place], [place]  HPI:    Leah Holland is a 82 year old [name] is a 82 y.o. widowed, Caucasian female, mother of two living children (one deceased), grandmother to five grandchildren, who I last saw 03/03/15. She is the youngest of 10 children. Her sister, [name] was also placed in mind that has passed away. She had one son who died again on, one son who is now a retired Lexicographer in one son who is a Biomedical scientist in [employer]. She has a history of hypertension and hyperlipidemia, as well as family history of heart disease. She had a remote right total knee replacement and a recent left one by Dr. French Ana. She had a Myoview stress test performed in our office on August 07, 2011, which was low risk. Recent lab work performed by Dr. Greta Doom revealed a total cholesterol of 133, HDL of 71, and HDL of 39.  Patient presented with apparent new onset atrial fibrillation with RVR. She was just seen at [facility] on 08/03/2013 for chest pain. She was diagnosed with gastroesophageal reflux disease and a UTI. At that time she was in normal sinus rhythm at a rate of 79 beats per minute and with PACs. She reported dizziness, feeling tired and a racing HR since ~Sat/Sun. She otherwise denied nausea, vomiting, fever, chest pain, dizziness, PND, cough, congestion, abdominal pain, hematochezia, melena, lower extremity edema, claudication. No recent GIB.  She was admitted and started on IV diltiazem 5mg /hr and Eliquis 2.5mg  twice daily. She converted to NSR at 0246hrs. She was changed to PO Cardizem 120mg  daily. She's been taking lasix 60mg  daily for LEE for a long time. This was changed to 20mg  daily as needed a long with 35meq as needed potassium.  Since I saw her last March 2016 she has broken her hip and has had a total hip  replacement by Dr. [name]. She denies chest pain or shortness of breath.   Current Outpatient Prescriptions  Medication Sig Dispense Refill  . acetaminophen (TYLENOL) 325 MG tablet Take 650 mg by mouth.    Marland Kitchen aspirin EC 81 MG tablet Take 81 mg by mouth daily.    Marland Kitchen atorvastatin (LIPITOR) 80 MG tablet Take 80 mg by mouth daily.    . benazepril (LOTENSIN) 40 MG tablet Take 1 tablet (40 mg total) by mouth every evening.    . chlorthalidone (HYGROTON) 25 MG tablet TAKE 1 TABLET ONCE DAILY. 30 tablet 5  . denosumab (PROLIA) 60 MG/ML SOLN injection Inject 60 mg into the skin every 6 (six) months. Administer in upper arm, thigh, or abdomen    . diltiazem (CARDIZEM CD) 180 MG 24 hr capsule TAKE 1 CAPSULE DAILY. 30 capsule 5  . ELIQUIS 2.5 MG TABS tablet TAKE 1 TABLET TWICE DAILY. 60 tablet 5  . Esomeprazole Magnesium (NEXIUM PO) Take 22.3 mg by mouth daily before breakfast. OTC    . Flaxseed, Linseed, (FLAXSEED OIL) 1000 MG CAPS 1 pill po bid (Patient taking differently: Take 1,000 mg by mouth 2 (two) times daily. )  0  . FLUoxetine (PROZAC) 40 MG capsule Take 40 mg by mouth daily.     . furosemide (LASIX) 20 MG tablet Take 1 tablet (20 mg total) by mouth as needed for edema. 30 tablet 5  . metoprolol (LOPRESSOR) 50 MG tablet Take 50 mg by  mouth 2 (two) times daily.    . Multiple Vitamins-Minerals (MACULAR VITAMIN BENEFIT) TABS 1 pill by mouth twice a day. There is no dose for multivitamin. (Patient taking differently: Take 1 tablet by mouth 2 (two) times daily. Macular Protect)    . polyethylene glycol (MIRALAX / GLYCOLAX) packet Take 17 g by mouth every other day. Mix in 8 oz water and drink    . potassium chloride (K-DUR) 10 MEQ tablet Take 1 tablet (10 mEq total) by mouth as needed. 30 tablet 5  . sulfamethoxazole-trimethoprim (BACTRIM) 400-80 MG tablet Take 1 tablet by mouth 2 (two) times daily. 60 tablet 8  . SYNTHROID 125 MCG tablet TAKE 1 TABLET ONCE DAILY BEFORE BREAKFAST. 30 tablet 2  .  temazepam (RESTORIL) 30 MG capsule Take 1 capsule (30 mg total) by mouth at bedtime as needed for sleep. 10 capsule 0   No current facility-administered medications for this visit.     Allergies  Allergen Reactions  . Morphine And Related Other (See Comments)    Made pt "wild"    Social History   Social History  . Marital status: Widowed    Spouse name: N/A  . Number of children: N/A  . Years of education: N/A   Occupational History  . Not on file.   Social History Main Topics  . Smoking status: Never Smoker  . Smokeless tobacco: Never Used  . Alcohol use No  . Drug use: No  . Sexual activity: No   Other Topics Concern  . Not on file   Social History Narrative   Widowed. Lives at [facility]. Son died in the [place] War and she has not slept well since then.     Review of Systems: General: negative for chills, fever, night sweats or weight changes.  Cardiovascular: negative for chest pain, dyspnea on exertion, edema, orthopnea, palpitations, paroxysmal nocturnal dyspnea or shortness of breath Dermatological: negative for rash Respiratory: negative for cough or wheezing Urologic: negative for hematuria Abdominal: negative for nausea, vomiting, diarrhea, bright red blood per rectum, melena, or hematemesis Neurologic: negative for visual changes, syncope, or dizziness All other systems reviewed and are otherwise negative except as noted above.    Blood pressure 124/60, pulse (!) 49, height 5\' 3"  (1.6 m), weight 125 lb (56.7 kg).  General appearance: alert and no distress Neck: no adenopathy, no carotid bruit, no JVD, supple, symmetrical, trachea midline and thyroid not enlarged, symmetric, no tenderness/mass/nodules Lungs: clear to auscultation bilaterally Heart: regular rate and rhythm, S1, S2 normal, no murmur, click, rub or gallop Extremities: extremities normal, atraumatic, no cyanosis or edema  EKG sinus bradycardia at 50 with nonspecific ST and T-wave  changes. I personally reviewed this EKG  ASSESSMENT AND PLAN:   Hypertension History of hypertension blood pressure measured 124/60. She is on Lotensin and diltiazem as well as chlorthalidone. Continue current meds at current dosing  Hyperlipidemia History of hyperlipidemia on statin therapy followed by her PCP  Paroxysmal atrial fibrillation ([place]) History of paroxysmal atrial fibrillation maintaining sinus rhythm on diltiazem and low-dose Eliquis       Lorretta Harp MD [facility], [facility] 09/06/2016 12:28 PM

## 2016-09-06 NOTE — Assessment & Plan Note (Signed)
History of hyperlipidemia on statin therapy followed by her PCP. 

## 2016-09-06 NOTE — Assessment & Plan Note (Signed)
History of hypertension blood pressure measured 124/60. She is on Lotensin and diltiazem as well as chlorthalidone. Continue current meds at current dosing

## 2016-09-12 ENCOUNTER — Ambulatory Visit (INDEPENDENT_AMBULATORY_CARE_PROVIDER_SITE_OTHER): Payer: Medicare Other | Admitting: Pharmacist Clinician (PhC)/ Clinical Pharmacy Specialist

## 2016-09-12 DIAGNOSIS — M01X0 Direct infection of unspecified joint in infectious and parasitic diseases classified elsewhere: Secondary | ICD-10-CM

## 2016-09-12 DIAGNOSIS — M0080 Arthritis due to other bacteria, unspecified joint: Secondary | ICD-10-CM

## 2016-09-12 DIAGNOSIS — T8459XA Infection and inflammatory reaction due to other internal joint prosthesis, initial encounter: Secondary | ICD-10-CM | POA: Diagnosis not present

## 2016-09-12 DIAGNOSIS — Z96649 Presence of unspecified artificial hip joint: Secondary | ICD-10-CM | POA: Diagnosis not present

## 2016-09-12 DIAGNOSIS — A498 Other bacterial infections of unspecified site: Secondary | ICD-10-CM | POA: Diagnosis not present

## 2016-09-12 LAB — CBC WITH DIFFERENTIAL/PLATELET
BASOS PCT: 0 %
Basophils Absolute: 0 cells/uL (ref 0–200)
Eosinophils Absolute: 272 cells/uL (ref 15–500)
Eosinophils Relative: 4 %
HEMATOCRIT: 38.2 % (ref 35.0–45.0)
Hemoglobin: 12.9 g/dL (ref 11.7–15.5)
Lymphocytes Relative: 37 %
Lymphs Abs: 2516 cells/uL (ref 850–3900)
MCH: 29.4 pg (ref 27.0–33.0)
MCHC: 33.8 g/dL (ref 32.0–36.0)
MCV: 87 fL (ref 80.0–100.0)
MONO ABS: 680 {cells}/uL (ref 200–950)
MONOS PCT: 10 %
MPV: 10.3 fL (ref 7.5–12.5)
Neutro Abs: 3332 cells/uL (ref 1500–7800)
Neutrophils Relative %: 49 %
Platelets: 250 10*3/uL (ref 140–400)
RBC: 4.39 MIL/uL (ref 3.80–5.10)
RDW: 15.2 % — AB (ref 11.0–15.0)
WBC: 6.8 10*3/uL (ref 3.8–10.8)

## 2016-09-12 LAB — COMPLETE METABOLIC PANEL WITH GFR
ALT: 29 U/L (ref 6–29)
AST: 23 U/L (ref 10–35)
Albumin: 4 g/dL (ref 3.6–5.1)
Alkaline Phosphatase: 83 U/L (ref 33–130)
BUN: 17 mg/dL (ref 7–25)
CALCIUM: 9.6 mg/dL (ref 8.6–10.4)
CHLORIDE: 94 mmol/L — AB (ref 98–110)
CO2: 27 mmol/L (ref 20–31)
CREATININE: 0.91 mg/dL — AB (ref 0.60–0.88)
GFR, EST AFRICAN AMERICAN: 63 mL/min (ref >=60)
GFR, EST NON AFRICAN AMERICAN: 55 mL/min — AB (ref >=60)
Glucose, Bld: 88 mg/dL (ref 65–99)
Potassium: 4.3 mmol/L (ref 3.5–5.3)
Sodium: 130 mmol/L — ABNORMAL LOW (ref 135–146)
Total Bilirubin: 0.7 mg/dL (ref 0.2–1.2)
Total Protein: 5.9 g/dL — ABNORMAL LOW (ref 6.1–8.1)

## 2016-09-12 NOTE — Progress Notes (Signed)
HPI: Leah Holland is a 82 y.o. female with a prosthetic hip infection that grew serratia marcescens on 2/2 cultures in April. She was originally treated with oral Levaquin but had complaints of pain behind her prosthetic knees. Due to the known incidence of tendonitis, Levaquin was discontinued. Leah Holland was then started on Septra SS twice a day due to her age and other risk factors.   Allergies: Allergies  Allergen Reactions  . Morphine And Related Other (See Comments)    Made pt "wild"    Vitals:    Past Medical History: Past Medical History:  Diagnosis Date  . Anxiety attack    "take RX prn" (08/13/2013)  . Arthritis    "probably" (08/13/2013)  . Atrial fibrillation ([place])    new onset/pt report 08/13/2013  . Bronchitis 12/2011  . Complication of anesthesia   . Depression   . Diabetes mellitus type 2, diet-controlled ([place])   . GERD (gastroesophageal reflux disease)   . H/O hiatal hernia   . Hardware complicating wound infection ([place]) 08/22/2016  . History of blood transfusion    "w/both knee OR's" (08/13/2013)  . Hyperlipidemia   . Hyperlipidemia   . Hypertension   . Hypothyroidism    takes synthroid  . Insomnia   . Intertrochanteric fracture of right hip ([place]) 07/30/2015  . Neuromuscular disorder ([place])    carpal tunnel in right hand  . Periprosthetic fracture around internal prosthetic right hip joint ([place]), nonunion basicervical femoral neck with broken hardware and subtrochanteric fracture 03/16/2016  . PONV (postoperative nausea and vomiting)   . 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 Proteus infection 08/22/2016  . Skin cancer of face 2000's   "one on each side of my face and one on my nose" (08/13/2013)  . Swelling of extremity    sees Dr. Gwenlyn Found, cardiologist 1 x year [phone]  . Tendon dysfunction 08/22/2016    Social History: Social History   Social History  . Marital status: Widowed    Spouse name: N/A  . Number of children: N/A  . Years of  education: N/A   Social History Main Topics  . Smoking status: Never Smoker  . Smokeless tobacco: Never Used  . Alcohol use No  . Drug use: No  . Sexual activity: No   Other Topics Concern  . Not on file   Social History Narrative   Widowed. Lives at [facility]. Son died in the [place] War and she has not slept well since then.    Previous Regimen: Levofloxacin 500 mg daily  Current Regimen: Septra SS twice a day   Assessment: Leah Holland is a 82 yo female who presented to the clinic with her son for a follow-up appointment for her prosthetic hip infection. Patient has been tolerating Septra well after starting therapy on 9/11. She reports that her leg pain dissipated 3 to 4 days after stopping the Levaquin.   Follow-up labs ordered today: CMET, CBC with diff, and Sed rate. Son will call for results in 2-3 days.   Patient was informed that she will likely be on Septra indefinitely due to her age and nature of the infection. She will follow-up with Dr. Tommy Medal on December 4th.   Recommendations: Continue Septra SS twice a day. Follow-up in 2 months for further monitoring.   Ihor Austin, PharmD PGY1 [facility] [phone] 09/12/2016, 3:05 PM

## 2016-09-12 NOTE — Patient Instructions (Signed)
Continue to take Septra or Bactrim 1 tablet twice daily

## 2016-09-12 NOTE — Progress Notes (Signed)
Agreed with Emily's note. She is doing much better on Septra now. Her son and pt were very appreciative of our help. We tried to order the CRP but medicare would not let us process it so we are doing ESR only. Told her son that we can f/u with her periodically for lab monitoring.   Onnie Boer, PharmD Pager: [phone] 09/12/2016 3:33 PM

## 2016-09-13 LAB — SEDIMENTATION RATE: SED RATE: 1 mm/h (ref 0–30)

## 2016-10-10 ENCOUNTER — Other Ambulatory Visit: Payer: Self-pay | Admitting: Cardiovascular Disease

## 2016-10-18 ENCOUNTER — Other Ambulatory Visit: Payer: Self-pay | Admitting: Cardiovascular Disease

## 2016-10-24 DIAGNOSIS — G4700 Insomnia, unspecified: Secondary | ICD-10-CM | POA: Diagnosis not present

## 2016-10-24 DIAGNOSIS — I4891 Unspecified atrial fibrillation: Secondary | ICD-10-CM | POA: Diagnosis not present

## 2016-10-24 DIAGNOSIS — E039 Hypothyroidism, unspecified: Secondary | ICD-10-CM | POA: Diagnosis not present

## 2016-10-24 DIAGNOSIS — R2681 Unsteadiness on feet: Secondary | ICD-10-CM | POA: Diagnosis not present

## 2016-10-24 DIAGNOSIS — I10 Essential (primary) hypertension: Secondary | ICD-10-CM | POA: Diagnosis not present

## 2016-10-24 DIAGNOSIS — D509 Iron deficiency anemia, unspecified: Secondary | ICD-10-CM | POA: Diagnosis not present

## 2016-10-24 DIAGNOSIS — Z0000 Encounter for general adult medical examination without abnormal findings: Secondary | ICD-10-CM | POA: Diagnosis not present

## 2016-10-24 DIAGNOSIS — Z96649 Presence of unspecified artificial hip joint: Secondary | ICD-10-CM | POA: Diagnosis not present

## 2016-10-24 DIAGNOSIS — Z974 Presence of external hearing-aid: Secondary | ICD-10-CM | POA: Diagnosis not present

## 2016-10-24 DIAGNOSIS — H9190 Unspecified hearing loss, unspecified ear: Secondary | ICD-10-CM | POA: Diagnosis not present

## 2016-10-24 DIAGNOSIS — E785 Hyperlipidemia, unspecified: Secondary | ICD-10-CM | POA: Diagnosis not present

## 2016-11-14 ENCOUNTER — Encounter: Payer: Self-pay | Admitting: Infectious Disease

## 2016-11-14 ENCOUNTER — Ambulatory Visit (INDEPENDENT_AMBULATORY_CARE_PROVIDER_SITE_OTHER): Payer: Medicare Other | Admitting: Infectious Disease

## 2016-11-14 VITALS — BP 170/66 | HR 49 | Temp 97.6°F | Ht 63.0 in | Wt 135.0 lb

## 2016-11-14 DIAGNOSIS — Z96649 Presence of unspecified artificial hip joint: Secondary | ICD-10-CM | POA: Diagnosis not present

## 2016-11-14 DIAGNOSIS — T847XXA Infection and inflammatory reaction due to other internal orthopedic prosthetic devices, implants and grafts, initial encounter: Secondary | ICD-10-CM

## 2016-11-14 DIAGNOSIS — T8459XA Infection and inflammatory reaction due to other internal joint prosthesis, initial encounter: Secondary | ICD-10-CM

## 2016-11-14 DIAGNOSIS — M6790 Unspecified disorder of synovium and tendon, unspecified site: Secondary | ICD-10-CM

## 2016-11-14 DIAGNOSIS — Z967 Presence of other bone and tendon implants: Secondary | ICD-10-CM

## 2016-11-14 DIAGNOSIS — Z9889 Other specified postprocedural states: Secondary | ICD-10-CM

## 2016-11-14 DIAGNOSIS — A498 Other bacterial infections of unspecified site: Secondary | ICD-10-CM | POA: Insufficient documentation

## 2016-11-14 DIAGNOSIS — Z8781 Personal history of (healed) traumatic fracture: Secondary | ICD-10-CM

## 2016-11-14 HISTORY — DX: Other bacterial infections of unspecified site: A49.8

## 2016-11-14 LAB — BASIC METABOLIC PANEL WITH GFR
BUN: 25 mg/dL (ref 7–25)
CHLORIDE: 103 mmol/L (ref 98–110)
CO2: 31 mmol/L (ref 20–31)
Calcium: 9.9 mg/dL (ref 8.6–10.4)
Creat: 1.14 mg/dL — ABNORMAL HIGH (ref 0.60–0.88)
GFR, EST AFRICAN AMERICAN: 48 mL/min — AB (ref >=60)
GFR, EST NON AFRICAN AMERICAN: 42 mL/min — AB (ref >=60)
GLUCOSE: 81 mg/dL (ref 65–99)
POTASSIUM: 4.3 mmol/L (ref 3.5–5.3)
Sodium: 139 mmol/L (ref 135–146)

## 2016-11-14 NOTE — Progress Notes (Signed)
Subjective:   Chief complaint: Occasional back pain with radiation down right leg   [name], female    DOB: 02/13/1922, 82 y.o.   MRN: [id number]  HPI  Leah Holland is a 82 year old lady with admission in April with right intertrochanteric proximal femur nonunion who progressed to ultimately break her intramedullary nail and elected for surgical management. She had removal of hardware including intramedullary nail, and treatment of intertrochanteric fracture with prosthesis placement. Dr. [name] got several intra-operative cultures due to concerrn for infection and two separate cultures grew serratia. Patient was placed on oral levaquin after discussion with me and has remained on this since then. She has done relatively well but recently began to complain of pain behind her prosthetic knees which she believed might be due to toxicity of fluoroquinolone to tendons after she and her son--who is a Pediatrician read about side effects of FQ.   I was  actually skeptical about FQ toxicity here but I DO think she would be better served over the long run if she is to be on chronic suppressive antibiotics NOT being on a fluoroquinolone given risk for Clostridium difficile colitis in particular. Change over to single strength Bactrim and her tendon pain went away immediately. She has had normal creatinine and potassium while on single strength Bactrim. She has instability of her back pain though at times has some spasming  Past Medical History:  Diagnosis Date  . Anxiety attack    "take RX prn" (08/13/2013)  . Arthritis    "probably" (08/13/2013)  . Atrial fibrillation ([place])    new onset/pt report 08/13/2013  . Bronchitis 12/2011  . Complication of anesthesia   . Depression   . Diabetes mellitus type 2, diet-controlled ([place])   . GERD (gastroesophageal reflux disease)   . H/O hiatal hernia   . Hardware complicating wound infection ([place]) 08/22/2016  . History of blood transfusion     "w/both knee OR's" (08/13/2013)  . Hyperlipidemia   . Hyperlipidemia   . Hypertension   . Hypothyroidism    takes synthroid  . Insomnia   . Intertrochanteric fracture of right hip ([place]) 07/30/2015  . Neuromuscular disorder ([place])    carpal tunnel in right hand  . Periprosthetic fracture around internal prosthetic right hip joint ([place]), nonunion basicervical femoral neck with broken hardware and subtrochanteric fracture 03/16/2016  . PONV (postoperative nausea and vomiting)   . 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 Proteus infection 08/22/2016  . Skin cancer of face 2000's   "one on each side of my face and one on my nose" (08/13/2013)  . Swelling of extremity    sees Dr. Gwenlyn Found, cardiologist 1 x year [phone]  . Tendon dysfunction 08/22/2016    Past Surgical History:  Procedure Laterality Date  . 2D ECHOCARDIOGRAM  12/07/2004   EF 48%, moderate pulmonary hypertension  . BREAST BIOPSY Left ?1970's  . CARDIOVASCULAR STRESS TEST  12/07/11  . CARDIOVASCULAR STRESS TEST  12/07/2011   LEXISCAN, normal scan, no significant wall abnormalities noted  . CARPAL TUNNEL RELEASE Right ~ 2005  . CATARACT EXTRACTION, BILATERAL Bilateral 2000  . DILATION AND CURETTAGE OF UTERUS  1950's  . DOPPLER ECHOCARDIOGRAPHY  12/07/11  . EYE SURGERY    . FEMUR IM NAIL Right 08/01/2015   Procedure: INTRAMEDULLARY (IM) NAIL FEMORAL;  Surgeon: Marchia Bond, MD;  Location: [facility];  Service: Orthopedics;  Laterality: Right;  . HARDWARE REMOVAL Right 03/16/2016   Procedure:  HARDWARE REMOVAL;  Surgeon: Marchia Bond, MD;  Location: [facility];  Service: Orthopedics;  Laterality: Right;  . JOINT REPLACEMENT     bilateral knee replacement  . SKIN CANCER EXCISION  2000's   "off my nose" (08/13/2013)  . TOTAL HIP ARTHROPLASTY Right 03/16/2016   Procedure: TOTAL HIP ARTHROPLASTY;  Surgeon: Marchia Bond, MD;  Location: [facility];  Service: Orthopedics;  Laterality: Right;  . TOTAL KNEE ARTHROPLASTY Right 2005  . TOTAL KNEE  ARTHROPLASTY  03/02/2012   Procedure: TOTAL KNEE ARTHROPLASTY;  Surgeon: Yvette Rack., MD;  Location: [facility];  Service: Orthopedics;  Laterality: Left;    Family History  Problem Relation Age of Onset  . Arrhythmia Mother   . Heart disease Mother   . Arrhythmia Sister   . Arrhythmia Brother   . Heart disease Brother   . Stroke Father 20  . Heart disease Brother   . Heart disease Brother   . Cancer Brother     Colon cancer  . Heart disease Sister   . Heart disease Sister   . Stroke Sister   . Diabetes Sister   . Heart disease Child 15  . Heart disease Child 4      Social History   Social History  . Marital status: Widowed    Spouse name: N/A  . Number of children: N/A  . Years of education: N/A   Social History Main Topics  . Smoking status: Never Smoker  . Smokeless tobacco: Never Used  . Alcohol use No  . Drug use: No  . Sexual activity: No   Other Topics Concern  . None   Social History Narrative   Widowed. Lives at [facility]. Son died in the [place] War and she has not slept well since then.    Allergies  Allergen Reactions  . Morphine And Related Other (See Comments)    Made pt "wild"     Current Outpatient Prescriptions:  .  acetaminophen (TYLENOL) 325 MG tablet, Take 650 mg by mouth., Disp: , Rfl:  .  aspirin EC 81 MG tablet, Take 81 mg by mouth daily., Disp: , Rfl:  .  atorvastatin (LIPITOR) 80 MG tablet, Take 80 mg by mouth daily., Disp: , Rfl:  .  benazepril (LOTENSIN) 40 MG tablet, Take 1 tablet (40 mg total) by mouth every evening., Disp: , Rfl:  .  chlorthalidone (HYGROTON) 25 MG tablet, Take 1 tablet (25 mg total) by mouth daily., Disp: 90 tablet, Rfl: 3 .  denosumab (PROLIA) 60 MG/ML SOLN injection, Inject 60 mg into the skin every 6 (six) months. Administer in upper arm, thigh, or abdomen, Disp: , Rfl:  .  diltiazem (CARDIZEM CD) 180 MG 24 hr capsule, Take 1 capsule (180 mg total) by mouth daily., Disp: 30 capsule, Rfl: 10 .   ELIQUIS 2.5 MG TABS tablet, TAKE 1 TABLET TWICE DAILY., Disp: 60 tablet, Rfl: 5 .  Esomeprazole Magnesium (NEXIUM PO), Take 22.3 mg by mouth daily before breakfast. OTC, Disp: , Rfl:  .  Flaxseed, Linseed, (FLAXSEED OIL) 1000 MG CAPS, 1 pill po bid (Patient taking differently: Take 1,000 mg by mouth 2 (two) times daily. ), Disp: , Rfl: 0 .  FLUoxetine (PROZAC) 40 MG capsule, Take 40 mg by mouth daily. , Disp: , Rfl:  .  furosemide (LASIX) 20 MG tablet, Take 1 tablet (20 mg total) by mouth as needed for edema., Disp: 30 tablet, Rfl: 5 .  metoprolol (LOPRESSOR) 50 MG tablet, Take 50  mg by mouth 2 (two) times daily., Disp: , Rfl:  .  Multiple Vitamins-Minerals (MACULAR VITAMIN BENEFIT) TABS, 1 pill by mouth twice a day. There is no dose for multivitamin. (Patient taking differently: Take 1 tablet by mouth 2 (two) times daily. Macular Protect), Disp: , Rfl:  .  polyethylene glycol (MIRALAX / GLYCOLAX) packet, Take 17 g by mouth every other day. Mix in 8 oz water and drink, Disp: , Rfl:  .  potassium chloride (K-DUR) 10 MEQ tablet, Take 1 tablet (10 mEq total) by mouth as needed., Disp: 30 tablet, Rfl: 5 .  sulfamethoxazole-trimethoprim (BACTRIM) 400-80 MG tablet, Take 1 tablet by mouth 2 (two) times daily., Disp: 60 tablet, Rfl: 8 .  SYNTHROID 125 MCG tablet, TAKE 1 TABLET ONCE DAILY BEFORE BREAKFAST., Disp: 30 tablet, Rfl: 2 .  temazepam (RESTORIL) 30 MG capsule, Take 1 capsule (30 mg total) by mouth at bedtime as needed for sleep., Disp: 10 capsule, Rfl: 0   Review of Systems  Constitutional: Negative for activity change, appetite change, chills, diaphoresis and fever.  HENT: Negative for congestion and sore throat.   Respiratory: Negative for cough and wheezing.   Cardiovascular: Negative for leg swelling.  Gastrointestinal: Negative for blood in stool, nausea and vomiting.  Genitourinary: Negative for dysuria, flank pain and hematuria.  Musculoskeletal: Positive for myalgias. Negative for back  pain.  Skin: Negative for rash.  Neurological: Negative for dizziness, weakness and headaches.  Hematological: Does not bruise/bleed easily.  Psychiatric/Behavioral: The patient is not nervous/anxious.        Objective:   Physical Exam  Constitutional: She is oriented to person, place, and time. She appears well-developed and well-nourished. No distress.  HENT:  Head: Normocephalic and atraumatic.  Mouth/Throat: No oropharyngeal exudate.  Eyes: Conjunctivae and EOM are normal. No scleral icterus.  Neck: Normal range of motion. Neck supple.  Cardiovascular: Normal rate and regular rhythm.   Pulmonary/Chest: Effort normal. No respiratory distress. She has no wheezes.  Abdominal: She exhibits no distension.  Musculoskeletal: She exhibits no edema or tenderness.  Neurological: She is alert and oriented to person, place, and time. She exhibits normal muscle tone. Coordination normal.  Skin: Skin is warm and dry. No rash noted. She is not diaphoretic. No erythema.  Psychiatric: She has a normal mood and affect. Her behavior is normal. Judgment normal.          Assessment & Plan:    Prosthetic hip infection: we did have several cases of serratia contaminating surgical specimens due to contaminated saline used in prep of SOME cultures done here at [facility]. I will review this specific case with Dr. Baxter Flattery. Given the fracture however and concern for infection having played a role I am going to have a very difficult time talking myself out of treating this patient with chronic antibiotics esp give the consequences of failure to suppress an infection at this site   We have  switch to Bactrim SS BID and she has tolerated this quite well. We will recheck labs again today.   ? FQ toxicity: Skeptical of toxicity better tendinopathy did improve after going off of the floor quinolones   We spent greater than 25 minutes with the patient including greater than 50% of time in face to face counsel of  the patient and her son re prosthetic hip infection with serratia, risk for FQ toxicities including tendonapathies, CDI, risks of bactrim including pseuodo and actual hyperkalemia and renal dysfunction and in coordination of her care.

## 2016-11-15 LAB — SEDIMENTATION RATE: Sed Rate: 1 mm/hr (ref 0–30)

## 2016-11-15 LAB — C-REACTIVE PROTEIN: CRP: 0.5 mg/L (ref <8.0)

## 2016-11-21 ENCOUNTER — Other Ambulatory Visit: Payer: Self-pay | Admitting: Cardiovascular Disease

## 2016-12-07 DIAGNOSIS — H353132 Nonexudative age-related macular degeneration, bilateral, intermediate dry stage: Secondary | ICD-10-CM | POA: Diagnosis not present

## 2016-12-07 DIAGNOSIS — H35372 Puckering of macula, left eye: Secondary | ICD-10-CM | POA: Diagnosis not present

## 2016-12-07 DIAGNOSIS — H524 Presbyopia: Secondary | ICD-10-CM | POA: Diagnosis not present

## 2016-12-07 DIAGNOSIS — H04123 Dry eye syndrome of bilateral lacrimal glands: Secondary | ICD-10-CM | POA: Diagnosis not present

## 2016-12-26 DIAGNOSIS — H35423 Microcystoid degeneration of retina, bilateral: Secondary | ICD-10-CM | POA: Diagnosis not present

## 2016-12-26 DIAGNOSIS — H35372 Puckering of macula, left eye: Secondary | ICD-10-CM | POA: Diagnosis not present

## 2016-12-26 DIAGNOSIS — H43813 Vitreous degeneration, bilateral: Secondary | ICD-10-CM | POA: Diagnosis not present

## 2016-12-26 DIAGNOSIS — H353132 Nonexudative age-related macular degeneration, bilateral, intermediate dry stage: Secondary | ICD-10-CM | POA: Diagnosis not present

## 2016-12-27 DIAGNOSIS — R5383 Other fatigue: Secondary | ICD-10-CM | POA: Diagnosis not present

## 2016-12-27 DIAGNOSIS — E559 Vitamin D deficiency, unspecified: Secondary | ICD-10-CM | POA: Diagnosis not present

## 2016-12-27 DIAGNOSIS — M810 Age-related osteoporosis without current pathological fracture: Secondary | ICD-10-CM | POA: Diagnosis not present

## 2017-01-06 DIAGNOSIS — M810 Age-related osteoporosis without current pathological fracture: Secondary | ICD-10-CM | POA: Diagnosis not present

## 2017-01-06 DIAGNOSIS — M25551 Pain in right hip: Secondary | ICD-10-CM | POA: Diagnosis not present

## 2017-01-11 DIAGNOSIS — R2681 Unsteadiness on feet: Secondary | ICD-10-CM | POA: Diagnosis not present

## 2017-01-11 DIAGNOSIS — D509 Iron deficiency anemia, unspecified: Secondary | ICD-10-CM | POA: Diagnosis not present

## 2017-01-11 DIAGNOSIS — I4891 Unspecified atrial fibrillation: Secondary | ICD-10-CM | POA: Diagnosis not present

## 2017-01-11 DIAGNOSIS — Z96649 Presence of unspecified artificial hip joint: Secondary | ICD-10-CM | POA: Diagnosis not present

## 2017-01-11 DIAGNOSIS — E785 Hyperlipidemia, unspecified: Secondary | ICD-10-CM | POA: Diagnosis not present

## 2017-01-11 DIAGNOSIS — I10 Essential (primary) hypertension: Secondary | ICD-10-CM | POA: Diagnosis not present

## 2017-01-11 DIAGNOSIS — Z974 Presence of external hearing-aid: Secondary | ICD-10-CM | POA: Diagnosis not present

## 2017-01-11 DIAGNOSIS — R748 Abnormal levels of other serum enzymes: Secondary | ICD-10-CM | POA: Diagnosis not present

## 2017-01-17 DIAGNOSIS — R2681 Unsteadiness on feet: Secondary | ICD-10-CM | POA: Diagnosis not present

## 2017-01-17 DIAGNOSIS — M6281 Muscle weakness (generalized): Secondary | ICD-10-CM | POA: Diagnosis not present

## 2017-01-17 DIAGNOSIS — M21751 Unequal limb length (acquired), right femur: Secondary | ICD-10-CM | POA: Diagnosis not present

## 2017-01-17 DIAGNOSIS — Z4789 Encounter for other orthopedic aftercare: Secondary | ICD-10-CM | POA: Diagnosis not present

## 2017-01-17 DIAGNOSIS — Z471 Aftercare following joint replacement surgery: Secondary | ICD-10-CM | POA: Diagnosis not present

## 2017-01-17 DIAGNOSIS — M722 Plantar fascial fibromatosis: Secondary | ICD-10-CM | POA: Diagnosis not present

## 2017-01-17 DIAGNOSIS — M25551 Pain in right hip: Secondary | ICD-10-CM | POA: Diagnosis not present

## 2017-01-17 DIAGNOSIS — M5431 Sciatica, right side: Secondary | ICD-10-CM | POA: Diagnosis not present

## 2017-01-23 DIAGNOSIS — M25551 Pain in right hip: Secondary | ICD-10-CM | POA: Diagnosis not present

## 2017-01-23 DIAGNOSIS — Z471 Aftercare following joint replacement surgery: Secondary | ICD-10-CM | POA: Diagnosis not present

## 2017-01-23 DIAGNOSIS — M6281 Muscle weakness (generalized): Secondary | ICD-10-CM | POA: Diagnosis not present

## 2017-01-23 DIAGNOSIS — M5431 Sciatica, right side: Secondary | ICD-10-CM | POA: Diagnosis not present

## 2017-01-23 DIAGNOSIS — R2681 Unsteadiness on feet: Secondary | ICD-10-CM | POA: Diagnosis not present

## 2017-01-23 DIAGNOSIS — M722 Plantar fascial fibromatosis: Secondary | ICD-10-CM | POA: Diagnosis not present

## 2017-01-25 ENCOUNTER — Emergency Department (HOSPITAL_COMMUNITY): Payer: Medicare Other

## 2017-01-25 ENCOUNTER — Inpatient Hospital Stay (HOSPITAL_COMMUNITY)
Admission: EM | Admit: 2017-01-25 | Discharge: 2017-01-30 | DRG: 481 | Disposition: A | Discharge status: Skilled Nursing Facility | Payer: Medicare Other | Attending: Internal Medicine | Admitting: Internal Medicine

## 2017-01-25 ENCOUNTER — Encounter (HOSPITAL_COMMUNITY): Payer: Self-pay | Admitting: Emergency Medicine

## 2017-01-25 DIAGNOSIS — W010XXA Fall on same level from slipping, tripping and stumbling without subsequent striking against object, initial encounter: Secondary | ICD-10-CM | POA: Diagnosis present

## 2017-01-25 DIAGNOSIS — S0990XA Unspecified injury of head, initial encounter: Secondary | ICD-10-CM | POA: Diagnosis not present

## 2017-01-25 DIAGNOSIS — Z823 Family history of stroke: Secondary | ICD-10-CM

## 2017-01-25 DIAGNOSIS — S72002A Fracture of unspecified part of neck of left femur, initial encounter for closed fracture: Secondary | ICD-10-CM

## 2017-01-25 DIAGNOSIS — Z419 Encounter for procedure for purposes other than remedying health state, unspecified: Secondary | ICD-10-CM

## 2017-01-25 DIAGNOSIS — F411 Generalized anxiety disorder: Secondary | ICD-10-CM | POA: Diagnosis present

## 2017-01-25 DIAGNOSIS — E871 Hypo-osmolality and hyponatremia: Secondary | ICD-10-CM | POA: Diagnosis not present

## 2017-01-25 DIAGNOSIS — S199XXA Unspecified injury of neck, initial encounter: Secondary | ICD-10-CM | POA: Diagnosis not present

## 2017-01-25 DIAGNOSIS — R262 Difficulty in walking, not elsewhere classified: Secondary | ICD-10-CM

## 2017-01-25 DIAGNOSIS — I272 Pulmonary hypertension, unspecified: Secondary | ICD-10-CM | POA: Diagnosis present

## 2017-01-25 DIAGNOSIS — D72829 Elevated white blood cell count, unspecified: Secondary | ICD-10-CM | POA: Diagnosis present

## 2017-01-25 DIAGNOSIS — D62 Acute posthemorrhagic anemia: Secondary | ICD-10-CM | POA: Diagnosis not present

## 2017-01-25 DIAGNOSIS — Z833 Family history of diabetes mellitus: Secondary | ICD-10-CM

## 2017-01-25 DIAGNOSIS — S72142K Displaced intertrochanteric fracture of left femur, subsequent encounter for closed fracture with nonunion: Secondary | ICD-10-CM | POA: Diagnosis present

## 2017-01-25 DIAGNOSIS — S299XXA Unspecified injury of thorax, initial encounter: Secondary | ICD-10-CM | POA: Diagnosis not present

## 2017-01-25 DIAGNOSIS — G4700 Insomnia, unspecified: Secondary | ICD-10-CM | POA: Diagnosis present

## 2017-01-25 DIAGNOSIS — S72142A Displaced intertrochanteric fracture of left femur, initial encounter for closed fracture: Principal | ICD-10-CM | POA: Diagnosis present

## 2017-01-25 DIAGNOSIS — T8451XS Infection and inflammatory reaction due to internal right hip prosthesis, sequela: Secondary | ICD-10-CM

## 2017-01-25 DIAGNOSIS — Z7409 Other reduced mobility: Secondary | ICD-10-CM

## 2017-01-25 DIAGNOSIS — Z792 Long term (current) use of antibiotics: Secondary | ICD-10-CM

## 2017-01-25 DIAGNOSIS — F329 Major depressive disorder, single episode, unspecified: Secondary | ICD-10-CM | POA: Diagnosis present

## 2017-01-25 DIAGNOSIS — E785 Hyperlipidemia, unspecified: Secondary | ICD-10-CM | POA: Diagnosis present

## 2017-01-25 DIAGNOSIS — E119 Type 2 diabetes mellitus without complications: Secondary | ICD-10-CM

## 2017-01-25 DIAGNOSIS — Z96653 Presence of artificial knee joint, bilateral: Secondary | ICD-10-CM | POA: Diagnosis present

## 2017-01-25 DIAGNOSIS — W19XXXA Unspecified fall, initial encounter: Secondary | ICD-10-CM | POA: Diagnosis present

## 2017-01-25 DIAGNOSIS — I10 Essential (primary) hypertension: Secondary | ICD-10-CM | POA: Diagnosis present

## 2017-01-25 DIAGNOSIS — M25552 Pain in left hip: Secondary | ICD-10-CM | POA: Diagnosis not present

## 2017-01-25 DIAGNOSIS — Z7982 Long term (current) use of aspirin: Secondary | ICD-10-CM

## 2017-01-25 DIAGNOSIS — K219 Gastro-esophageal reflux disease without esophagitis: Secondary | ICD-10-CM | POA: Diagnosis present

## 2017-01-25 DIAGNOSIS — I480 Paroxysmal atrial fibrillation: Secondary | ICD-10-CM | POA: Diagnosis not present

## 2017-01-25 DIAGNOSIS — S72092A Other fracture of head and neck of left femur, initial encounter for closed fracture: Secondary | ICD-10-CM

## 2017-01-25 DIAGNOSIS — Z8249 Family history of ischemic heart disease and other diseases of the circulatory system: Secondary | ICD-10-CM

## 2017-01-25 DIAGNOSIS — N179 Acute kidney failure, unspecified: Secondary | ICD-10-CM | POA: Diagnosis not present

## 2017-01-25 DIAGNOSIS — Z85828 Personal history of other malignant neoplasm of skin: Secondary | ICD-10-CM

## 2017-01-25 DIAGNOSIS — Z800 Family history of malignant neoplasm of digestive organs: Secondary | ICD-10-CM

## 2017-01-25 DIAGNOSIS — S50312A Abrasion of left elbow, initial encounter: Secondary | ICD-10-CM | POA: Diagnosis not present

## 2017-01-25 DIAGNOSIS — M810 Age-related osteoporosis without current pathological fracture: Secondary | ICD-10-CM | POA: Diagnosis present

## 2017-01-25 DIAGNOSIS — E039 Hypothyroidism, unspecified: Secondary | ICD-10-CM | POA: Diagnosis present

## 2017-01-25 DIAGNOSIS — Z7901 Long term (current) use of anticoagulants: Secondary | ICD-10-CM

## 2017-01-25 DIAGNOSIS — Y831 Surgical operation with implant of artificial internal device as the cause of abnormal reaction of the patient, or of later complication, without mention of misadventure at the time of the procedure: Secondary | ICD-10-CM | POA: Diagnosis present

## 2017-01-25 DIAGNOSIS — T148XXA Other injury of unspecified body region, initial encounter: Secondary | ICD-10-CM | POA: Diagnosis not present

## 2017-01-25 LAB — CBC WITH DIFFERENTIAL/PLATELET
Basophils Absolute: 0 10*3/uL (ref 0.0–0.1)
Basophils Relative: 0 %
Eosinophils Absolute: 0.1 10*3/uL (ref 0.0–0.7)
Eosinophils Relative: 0 %
HCT: 36.7 % (ref 36.0–46.0)
Hemoglobin: 12.3 g/dL (ref 12.0–15.0)
Lymphocytes Relative: 9 %
Lymphs Abs: 1.4 10*3/uL (ref 0.7–4.0)
MCH: 31.1 pg (ref 26.0–34.0)
MCHC: 33.5 g/dL (ref 30.0–36.0)
MCV: 92.7 fL (ref 78.0–100.0)
Monocytes Absolute: 0.8 10*3/uL (ref 0.1–1.0)
Monocytes Relative: 5 %
Neutro Abs: 12.9 10*3/uL — ABNORMAL HIGH (ref 1.7–7.7)
Neutrophils Relative %: 86 %
Platelets: 229 10*3/uL (ref 150–400)
RBC: 3.96 MIL/uL (ref 3.87–5.11)
RDW: 14 % (ref 11.5–15.5)
WBC: 15.3 10*3/uL — ABNORMAL HIGH (ref 4.0–10.5)

## 2017-01-25 LAB — BASIC METABOLIC PANEL
Anion gap: 12 (ref 5–15)
BUN: 18 mg/dL (ref 6–20)
CO2: 24 mmol/L (ref 22–32)
Calcium: 9.9 mg/dL (ref 8.9–10.3)
Chloride: 97 mmol/L — ABNORMAL LOW (ref 101–111)
Creatinine, Ser: 1.1 mg/dL — ABNORMAL HIGH (ref 0.44–1.00)
GFR calc Af Amer: 49 mL/min — ABNORMAL LOW (ref >60)
GFR calc non Af Amer: 42 mL/min — ABNORMAL LOW (ref >60)
Glucose, Bld: 179 mg/dL — ABNORMAL HIGH (ref 65–99)
Potassium: 3.6 mmol/L (ref 3.5–5.1)
Sodium: 133 mmol/L — ABNORMAL LOW (ref 135–145)

## 2017-01-25 MED ORDER — ONDANSETRON HCL 4 MG/2ML IJ SOLN
4.0000 mg | Freq: Once | INTRAMUSCULAR | Status: AC
  Administered 2017-01-25: 4 mg via INTRAVENOUS
  Filled 2017-01-25: qty 2

## 2017-01-25 MED ORDER — SODIUM CHLORIDE 0.9 % IV SOLN
INTRAVENOUS | Status: DC
  Administered 2017-01-26: via INTRAVENOUS

## 2017-01-25 MED ORDER — FENTANYL CITRATE (PF) 100 MCG/2ML IJ SOLN
50.0000 ug | INTRAMUSCULAR | Status: AC | PRN
  Administered 2017-01-25 – 2017-01-26 (×2): 50 ug via INTRAVENOUS
  Filled 2017-01-25 (×2): qty 2

## 2017-01-25 NOTE — ED Notes (Signed)
[name] - Son - [phone]

## 2017-01-25 NOTE — ED Provider Notes (Signed)
[facility] DEPT Provider Note   CSN: [id number] Arrival date & time: 01/25/17  2103     History   Chief Complaint Chief Complaint  Patient presents with  . Fall    HPI Leah Holland is a 83 y.o. female with recent history of right hip fracture who presents following a fall with left hip pain. Patient states she was ambulating and tripped and fell on her left side. Patient states hitting her face slightly on the pavement, but hit mostly on her left hip. Patient crawled inside to call 911. Patient states this took around hour. Patient lives in the independent living at the [facility] home. Patient states she did not lose consciousness. She has had associated nausea, but no vomiting. She denies any pain elsewhere. She denies any neck pain, back pain, chest pain, shortness of breath, abdominal pain, urinary symptoms.  HPI  Past Medical History:  Diagnosis Date  . Anxiety attack    "take RX prn" (08/13/2013)  . Arthritis    "probably" (08/13/2013)  . Atrial fibrillation ([place])    new onset/pt report 08/13/2013  . Bronchitis 12/2011  . Complication of anesthesia   . Depression   . Diabetes mellitus type 2, diet-controlled ([place])   . GERD (gastroesophageal reflux disease)   . H/O hiatal hernia   . Hardware complicating wound infection ([place]) 08/22/2016  . History of blood transfusion    "w/both knee OR's" (08/13/2013)  . Hyperlipidemia   . Hyperlipidemia   . Hypertension   . Hypothyroidism    takes synthroid  . Insomnia   . Intertrochanteric fracture of right hip ([place]) 07/30/2015  . Neuromuscular disorder ([place])    carpal tunnel in right hand  . Periprosthetic fracture around internal prosthetic right hip joint ([place]), nonunion basicervical femoral neck with broken hardware and subtrochanteric fracture 03/16/2016  . PONV (postoperative nausea and vomiting)   . Prosthetic hip infection ([place]) 08/22/2016  . Proteus infection 08/22/2016  . Serratia infection 11/14/2016  . Skin cancer of face  2000's   "one on each side of my face and one on my nose" (08/13/2013)  . Swelling of extremity    sees Dr. Gwenlyn Found, cardiologist 1 x year [phone]  . Tendon dysfunction 08/22/2016    Patient Active Problem List   Diagnosis Date Noted  . Serratia infection 11/14/2016  . Hardware complicating wound infection ([place]) 08/22/2016  . Prosthetic hip infection ([place]) 08/22/2016  . Proteus infection 08/22/2016  . Tendinopathy 08/22/2016  . Clinical depression 04/07/2016  . Arthritis of knee, degenerative 04/07/2016  . OP (osteoporosis) 04/07/2016  . 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 Periprosthetic fracture around internal prosthetic right hip joint ([place]), nonunion basicervical femoral neck with broken hardware and subtrochanteric fracture 03/16/2016  . Hip fx ([place]) 03/16/2016  . Preoperative clearance 03/08/2016  . Chronic anticoagulation 08/06/2015  . Bipolar disorder ([place]) 08/04/2015  . Hyponatremia 08/04/2015  . Constipation 08/04/2015  . Fracture, intertrochanteric, right femur ([place])   . S/P ORIF (open reduction internal fixation) fracture   . Complication of anesthesia 07/30/2015  . Diabetes mellitus type 2, diet-controlled ([place])   . Skin cancer of face   . Paroxysmal atrial fibrillation ([place]) 09/01/2014  . Right carotid bruit 11/18/2013  . Postoperative anemia due to acute blood loss 03/03/2012  . Hypertension   . Hyperlipidemia   . Swelling of extremity   . Cataract   . Hypothyroidism   . Arthritis   . Insomnia     Past Surgical History:  Procedure  Laterality Date  . 2D ECHOCARDIOGRAM  12/07/2004   EF 48%, moderate pulmonary hypertension  . BREAST BIOPSY Left ?1970's  . CARDIOVASCULAR STRESS TEST  12/07/11  . CARDIOVASCULAR STRESS TEST  12/07/2011   LEXISCAN, normal scan, no significant wall abnormalities noted  . CARPAL TUNNEL RELEASE Right ~ 2005  . CATARACT EXTRACTION, BILATERAL Bilateral 2000  . DILATION AND CURETTAGE OF UTERUS  1950's  . DOPPLER  ECHOCARDIOGRAPHY  12/07/11  . EYE SURGERY    . FEMUR IM NAIL Right 08/01/2015   Procedure: INTRAMEDULLARY (IM) NAIL FEMORAL;  Surgeon: Marchia Bond, MD;  Location: [facility];  Service: Orthopedics;  Laterality: Right;  . HARDWARE REMOVAL Right 03/16/2016   Procedure: HARDWARE REMOVAL;  Surgeon: Marchia Bond, MD;  Location: [facility];  Service: Orthopedics;  Laterality: Right;  . JOINT REPLACEMENT     bilateral knee replacement  . SKIN CANCER EXCISION  2000's   "off my nose" (08/13/2013)  . TOTAL HIP ARTHROPLASTY Right 03/16/2016   Procedure: TOTAL HIP ARTHROPLASTY;  Surgeon: Marchia Bond, MD;  Location: [facility];  Service: Orthopedics;  Laterality: Right;  . TOTAL KNEE ARTHROPLASTY Right 2005  . TOTAL KNEE ARTHROPLASTY  03/02/2012   Procedure: TOTAL KNEE ARTHROPLASTY;  Surgeon: Yvette Rack., MD;  Location: [facility];  Service: Orthopedics;  Laterality: Left;    OB History    No data available       Home Medications    Prior to Admission medications   Medication Sig Start Date End Date Taking? Authorizing Provider  acetaminophen (TYLENOL) 325 MG tablet Take 650 mg by mouth every 6 (six) hours as needed for moderate pain.    Yes Historical Provider, MD  aspirin EC 81 MG tablet Take 81 mg by mouth daily.   Yes Historical Provider, MD  atorvastatin (LIPITOR) 80 MG tablet Take 80 mg by mouth daily.   Yes Historical Provider, MD  benazepril (LOTENSIN) 40 MG tablet Take 1 tablet (40 mg total) by mouth every evening. 08/06/15  Yes Thurnell Lose, MD  calcium carbonate (OS-CAL - DOSED IN MG OF ELEMENTAL CALCIUM) 1250 (500 Ca) MG tablet Take 1 tablet by mouth every evening.   Yes Historical Provider, MD  chlorthalidone (HYGROTON) 25 MG tablet Take 1 tablet (25 mg total) by mouth daily. 10/18/16  Yes Lorretta Harp, MD  Cholecalciferol 2000 units CAPS Take 2,000 Units by mouth every evening.   Yes Historical Provider, MD  denosumab (PROLIA) 60 MG/ML SOLN injection Inject 60 mg into the skin every 6 (six)  months. Administer in upper arm, thigh, or abdomen   Yes Historical Provider, MD  diltiazem (CARDIZEM CD) 180 MG 24 hr capsule Take 1 capsule (180 mg total) by mouth daily. 10/10/16  Yes Lorretta Harp, MD  ELIQUIS 2.5 MG TABS tablet TAKE 1 TABLET TWICE DAILY. 11/21/16  Yes Lorretta Harp, MD  esomeprazole (NEXIUM) 20 MG capsule Take 20 mg by mouth daily at 12 noon.   Yes Historical Provider, MD  Flaxseed, Linseed, (FLAXSEED OIL) 1000 MG CAPS 1 pill po bid Patient taking differently: Take 1,000 mg by mouth 2 (two) times daily.  08/03/15  Yes Thurnell Lose, MD  FLUoxetine (PROZAC) 40 MG capsule Take 40 mg by mouth daily.  03/03/16  Yes Historical Provider, MD  furosemide (LASIX) 20 MG tablet Take 1 tablet (20 mg total) by mouth as needed for edema. 06/08/16  Yes Lorretta Harp, MD  metoprolol (LOPRESSOR) 50 MG tablet Take 50 mg by mouth  2 (two) times daily.   Yes Historical Provider, MD  Multiple Vitamins-Minerals (MACULAR VITAMIN BENEFIT) TABS 1 pill by mouth twice a day. There is no dose for multivitamin. Patient taking differently: Take 1 tablet by mouth 2 (two) times daily. Macular Protect 08/03/15  Yes Thurnell Lose, MD  polyethylene glycol (MIRALAX / GLYCOLAX) packet Take 17 g by mouth every other day. Mix in 8 oz water and drink   Yes Historical Provider, MD  potassium chloride (K-DUR) 10 MEQ tablet Take 1 tablet (10 mEq total) by mouth as needed. Patient taking differently: Take 10 mEq by mouth as needed (takes with lasix only).  06/28/16  Yes Lorretta Harp, MD  sulfamethoxazole-trimethoprim (BACTRIM) 400-80 MG tablet Take 1 tablet by mouth 2 (two) times daily. 08/22/16  Yes Truman Hayward, MD  SYNTHROID 125 MCG tablet TAKE 1 TABLET ONCE DAILY BEFORE BREAKFAST. 07/04/16  Yes Estill Dooms, MD  temazepam (RESTORIL) 30 MG capsule Take 1 capsule (30 mg total) by mouth at bedtime as needed for sleep. 08/03/15  Yes Thurnell Lose, MD    Family History Family History  Problem  Relation Age of Onset  . Arrhythmia Mother   . Heart disease Mother   . Arrhythmia Sister   . Stroke Father 41  . Heart disease Brother   . Heart disease Brother   . Cancer Brother     Colon cancer  . Arrhythmia Brother   . Heart disease Brother   . Heart disease Sister   . Heart disease Sister   . Stroke Sister   . Diabetes Sister   . Heart disease Child 72  . Heart disease Child 71    Social History Social History  Substance Use Topics  . Smoking status: Never Smoker  . Smokeless tobacco: Never Used  . Alcohol use No     Allergies   Levaquin [levofloxacin] and Morphine and related   Review of Systems Review of Systems  Constitutional: Negative for chills and fever.  HENT: Negative for facial swelling and sore throat.   Respiratory: Negative for shortness of breath.   Cardiovascular: Negative for chest pain.  Gastrointestinal: Negative for abdominal pain, nausea and vomiting.  Genitourinary: Negative for dysuria.  Musculoskeletal: Positive for arthralgias (L hip). Negative for back pain and neck pain.  Skin: Positive for wound (Abrasion to L elbow). Negative for rash.  Neurological: Negative for headaches.  Psychiatric/Behavioral: The patient is not nervous/anxious.      Physical Exam Updated Vital Signs BP 165/62   Pulse 63   SpO2 96%   Physical Exam  Constitutional: She appears well-developed and well-nourished. No distress.  HENT:  Head: Normocephalic and atraumatic.  Mouth/Throat: Oropharynx is clear and moist. No oropharyngeal exudate.  Eyes: Conjunctivae are normal. Pupils are equal, round, and reactive to light. Right eye exhibits no discharge. Left eye exhibits no discharge. No scleral icterus.  Neck: Normal range of motion. Neck supple. No thyromegaly present.  Cardiovascular: Normal rate, regular rhythm, normal heart sounds and intact distal pulses.  Exam reveals no gallop and no friction rub.   No murmur heard. Pulmonary/Chest: Effort normal  and breath sounds normal. No stridor. No respiratory distress. She has no wheezes. She has no rales.  Abdominal: Soft. Bowel sounds are normal. She exhibits no distension. There is no tenderness. There is no rebound and no guarding.  Musculoskeletal: She exhibits no edema.       Right shoulder: Normal.       Left  shoulder: Normal.       Left elbow: She exhibits laceration (abrasion to L elbow). She exhibits normal range of motion and no swelling. No tenderness found.       Left wrist: Normal.       Left hip: She exhibits decreased range of motion, decreased strength, tenderness and bony tenderness.       Cervical back: She exhibits no tenderness and no bony tenderness.       Thoracic back: She exhibits no tenderness and no bony tenderness.       Lumbar back: Normal. She exhibits no tenderness and no bony tenderness.  Lymphadenopathy:    She has no cervical adenopathy.  Neurological: She is alert. Coordination normal.  Skin: Skin is warm and dry. No rash noted. She is not diaphoretic. No pallor.  Psychiatric: She has a normal mood and affect.  Nursing note and vitals reviewed.    ED Treatments / Results  Labs (all labs ordered are listed, but only abnormal results are displayed) Labs Reviewed  BASIC METABOLIC PANEL - Abnormal; Notable for the following:       Result Value   Sodium 133 (*)    Chloride 97 (*)    Glucose, Bld 179 (*)    Creatinine, Ser 1.10 (*)    GFR calc non Af Amer 42 (*)    GFR calc Af Amer 49 (*)    All other components within normal limits  CBC WITH DIFFERENTIAL/PLATELET - Abnormal; Notable for the following:    WBC 15.3 (*)    Neutro Abs 12.9 (*)    All other components within normal limits  URINALYSIS, ROUTINE W REFLEX MICROSCOPIC  PROTIME-INR  TYPE AND SCREEN    EKG  EKG Interpretation None       Radiology Dg Chest 2 View  Result Date: 01/25/2017 CLINICAL DATA:  Status post fall.  Congestion.  Initial encounter. EXAM: CHEST  2 VIEW COMPARISON:   Chest radiograph performed 03/16/2016 FINDINGS: The lungs are well-aerated. Minimal left basilar atelectasis is noted. There is no evidence of pleural effusion or pneumothorax. The heart is borderline enlarged. No acute osseous abnormalities are seen. Scattered vascular calcifications are seen. IMPRESSION: Borderline cardiomegaly.  Minimal left basilar atelectasis noted. Electronically Signed   By: Garald Balding M.D.   On: 01/25/2017 23:59   Ct Head Wo Contrast  Result Date: 01/25/2017 CLINICAL DATA:  Fall EXAM: CT HEAD WITHOUT CONTRAST CT CERVICAL SPINE WITHOUT CONTRAST TECHNIQUE: Multidetector CT imaging of the head and cervical spine was performed following the standard protocol without intravenous contrast. Multiplanar CT image reconstructions of the cervical spine were also generated. COMPARISON:  None. FINDINGS: CT HEAD FINDINGS Brain: No mass lesion, intraparenchymal hemorrhage or extra-axial collection. No evidence of acute cortical infarct. Brain parenchyma and CSF-containing spaces are normal for age. Vascular: No hyperdense vessel or unexpected calcification. Skull: Normal visualized skull base, calvarium and extracranial soft tissues. Sinuses/Orbits: No sinus fluid levels or advanced mucosal thickening. No mastoid effusion. Normal orbits. CT CERVICAL SPINE FINDINGS Alignment: Grade 1 anterolisthesis at C2-C3. Facets are aligned. Occipital condyles are normally positioned. Skull base and vertebrae: No acute fracture. Soft tissues and spinal canal: No prevertebral fluid or swelling. No visible canal hematoma. Disc levels: Multilevel severe disc space loss with osteophyte formation and facet hypertrophy. Severe left C5-6 foraminal stenosis. No bony spinal canal stenosis. Upper chest: No pneumothorax, pulmonary nodule or pleural effusion. Other: Normal visualized paraspinal cervical soft tissues. IMPRESSION: 1. No acute intracranial abnormality. 2. No acute  fracture of the cervical spine. 3. Marked  cervical degenerative disc disease and facet hypertrophy. Severe left C5-6 foraminal stenosis. Electronically Signed   By: Ulyses Jarred M.D.   On: 01/25/2017 22:31   Ct Cervical Spine Wo Contrast  Result Date: 01/25/2017 CLINICAL DATA:  Fall EXAM: CT HEAD WITHOUT CONTRAST CT CERVICAL SPINE WITHOUT CONTRAST TECHNIQUE: Multidetector CT imaging of the head and cervical spine was performed following the standard protocol without intravenous contrast. Multiplanar CT image reconstructions of the cervical spine were also generated. COMPARISON:  None. FINDINGS: CT HEAD FINDINGS Brain: No mass lesion, intraparenchymal hemorrhage or extra-axial collection. No evidence of acute cortical infarct. Brain parenchyma and CSF-containing spaces are normal for age. Vascular: No hyperdense vessel or unexpected calcification. Skull: Normal visualized skull base, calvarium and extracranial soft tissues. Sinuses/Orbits: No sinus fluid levels or advanced mucosal thickening. No mastoid effusion. Normal orbits. CT CERVICAL SPINE FINDINGS Alignment: Grade 1 anterolisthesis at C2-C3. Facets are aligned. Occipital condyles are normally positioned. Skull base and vertebrae: No acute fracture. Soft tissues and spinal canal: No prevertebral fluid or swelling. No visible canal hematoma. Disc levels: Multilevel severe disc space loss with osteophyte formation and facet hypertrophy. Severe left C5-6 foraminal stenosis. No bony spinal canal stenosis. Upper chest: No pneumothorax, pulmonary nodule or pleural effusion. Other: Normal visualized paraspinal cervical soft tissues. IMPRESSION: 1. No acute intracranial abnormality. 2. No acute fracture of the cervical spine. 3. Marked cervical degenerative disc disease and facet hypertrophy. Severe left C5-6 foraminal stenosis. Electronically Signed   By: Ulyses Jarred M.D.   On: 01/25/2017 22:31   Dg Hip Unilat W Or Wo Pelvis 2-3 Views Left  Result Date: 01/25/2017 CLINICAL DATA:  Status post fall,  with generalized left hip pain. Initial encounter. EXAM: DG HIP (WITH OR WITHOUT PELVIS) 2-3V LEFT COMPARISON:  None. FINDINGS: There is a comminuted left femoral intertrochanteric fracture, with shortening, and displacement of a lesser trochanteric fragment. The patient's right hip arthroplasty is grossly unremarkable in appearance, though incompletely characterized, without evidence of loosening. The left femoral head remains seated at the acetabulum, with minimal superior joint space narrowing noted. The sacroiliac joints are unremarkable in appearance. The visualized bowel gas pattern is grossly unremarkable in appearance. IMPRESSION: Comminuted left femoral intertrochanteric fracture, with shortening, and displacement of a lesser trochanteric fragment. Electronically Signed   By: Garald Balding M.D.   On: 01/25/2017 22:11    Procedures Procedures (including critical care time)  Medications Ordered in ED Medications  fentaNYL (SUBLIMAZE) injection 50 mcg (50 mcg Intravenous Given 01/25/17 2315)  0.9 %  sodium chloride infusion ( Intravenous New Bag/Given 01/26/17 0009)  ondansetron (ZOFRAN) injection 4 mg (4 mg Intravenous Given 01/25/17 2315)     Initial Impression / Assessment and Plan / ED Course  I have reviewed the triage vital signs and the nursing notes.  Pertinent labs & imaging results that were available during my care of the patient were reviewed by me and considered in my medical decision making (see chart for details).     Patient with fracture of left hip, x-ray shows Comminuted left femoral intertrochanteric fracture, with shortening, and displacement of a lesser trochanteric fragment. CT head and C-spine negative for acute injury. CXR shows borderline cardiomegaly, minimal left basilar atelectasis noted. CBC shows WBC 15.3. BMP shows sodium 133, chloride 97, glucose 179, creatinine 1.10. I consulted orthopedics and spoke with Dr. Ronnie Derby who will see the patient in the morning.  Dr. Ronnie Derby requests hospitalist admission. I consulted [facility]  and spoke with Dr. Tamala Julian who will admit the patient for further evaluation and treatment.I discussed patient case with Dr. Jeanell Sparrow who guided the patient's management and agrees with plan.  Final Clinical Impressions(s) / ED Diagnoses   Final diagnoses:  Closed fracture of left hip, initial encounter [facility])    New Prescriptions New Prescriptions   No medications on file     Frederica Kuster, PA-C 01/26/17 0129    Pattricia Boss, MD 01/26/17 [phone]

## 2017-01-25 NOTE — ED Notes (Signed)
Paged [facility]

## 2017-01-25 NOTE — ED Triage Notes (Signed)
Patient from [facility].  She fell on her left hip and hit the left side of face.  She just recovered from a fall and right hip surgery.  She is having left hip pain, 1 inch of shortening of the left leg, CSMTs and pulses intact.  Patient is on Eliquis.  She is having some nausea, was given 4mg  Zofran en route to ED.

## 2017-01-26 ENCOUNTER — Inpatient Hospital Stay (HOSPITAL_COMMUNITY): Payer: Medicare Other

## 2017-01-26 ENCOUNTER — Inpatient Hospital Stay (HOSPITAL_COMMUNITY): Payer: Medicare Other | Admitting: Anesthesiology

## 2017-01-26 ENCOUNTER — Encounter (HOSPITAL_COMMUNITY): Payer: Self-pay | Admitting: Certified Registered Nurse Anesthetist

## 2017-01-26 ENCOUNTER — Encounter (HOSPITAL_COMMUNITY)
Admission: EM | Disposition: A | Discharge status: Skilled Nursing Facility | Payer: Self-pay | Source: Home / Self Care | Attending: Internal Medicine

## 2017-01-26 DIAGNOSIS — E871 Hypo-osmolality and hyponatremia: Secondary | ICD-10-CM | POA: Diagnosis present

## 2017-01-26 DIAGNOSIS — D72829 Elevated white blood cell count, unspecified: Secondary | ICD-10-CM | POA: Diagnosis not present

## 2017-01-26 DIAGNOSIS — F329 Major depressive disorder, single episode, unspecified: Secondary | ICD-10-CM | POA: Diagnosis present

## 2017-01-26 DIAGNOSIS — E119 Type 2 diabetes mellitus without complications: Secondary | ICD-10-CM

## 2017-01-26 DIAGNOSIS — Z823 Family history of stroke: Secondary | ICD-10-CM | POA: Diagnosis not present

## 2017-01-26 DIAGNOSIS — S72092S Other fracture of head and neck of left femur, sequela: Secondary | ICD-10-CM | POA: Diagnosis not present

## 2017-01-26 DIAGNOSIS — R29898 Other symptoms and signs involving the musculoskeletal system: Secondary | ICD-10-CM | POA: Diagnosis not present

## 2017-01-26 DIAGNOSIS — F419 Anxiety disorder, unspecified: Secondary | ICD-10-CM | POA: Diagnosis not present

## 2017-01-26 DIAGNOSIS — M810 Age-related osteoporosis without current pathological fracture: Secondary | ICD-10-CM | POA: Diagnosis present

## 2017-01-26 DIAGNOSIS — Z471 Aftercare following joint replacement surgery: Secondary | ICD-10-CM | POA: Diagnosis not present

## 2017-01-26 DIAGNOSIS — Z8249 Family history of ischemic heart disease and other diseases of the circulatory system: Secondary | ICD-10-CM | POA: Diagnosis not present

## 2017-01-26 DIAGNOSIS — M1990 Unspecified osteoarthritis, unspecified site: Secondary | ICD-10-CM | POA: Diagnosis not present

## 2017-01-26 DIAGNOSIS — Z800 Family history of malignant neoplasm of digestive organs: Secondary | ICD-10-CM | POA: Diagnosis not present

## 2017-01-26 DIAGNOSIS — K440 Diaphragmatic hernia with obstruction, without gangrene: Secondary | ICD-10-CM | POA: Diagnosis not present

## 2017-01-26 DIAGNOSIS — S72092A Other fracture of head and neck of left femur, initial encounter for closed fracture: Secondary | ICD-10-CM | POA: Diagnosis not present

## 2017-01-26 DIAGNOSIS — M21751 Unequal limb length (acquired), right femur: Secondary | ICD-10-CM | POA: Diagnosis not present

## 2017-01-26 DIAGNOSIS — W19XXXA Unspecified fall, initial encounter: Secondary | ICD-10-CM | POA: Diagnosis present

## 2017-01-26 DIAGNOSIS — W19XXXS Unspecified fall, sequela: Secondary | ICD-10-CM | POA: Diagnosis not present

## 2017-01-26 DIAGNOSIS — E785 Hyperlipidemia, unspecified: Secondary | ICD-10-CM

## 2017-01-26 DIAGNOSIS — R531 Weakness: Secondary | ICD-10-CM | POA: Diagnosis not present

## 2017-01-26 DIAGNOSIS — F339 Major depressive disorder, recurrent, unspecified: Secondary | ICD-10-CM | POA: Diagnosis not present

## 2017-01-26 DIAGNOSIS — I4891 Unspecified atrial fibrillation: Secondary | ICD-10-CM | POA: Diagnosis not present

## 2017-01-26 DIAGNOSIS — E039 Hypothyroidism, unspecified: Secondary | ICD-10-CM | POA: Diagnosis not present

## 2017-01-26 DIAGNOSIS — G4700 Insomnia, unspecified: Secondary | ICD-10-CM | POA: Diagnosis present

## 2017-01-26 DIAGNOSIS — S72002A Fracture of unspecified part of neck of left femur, initial encounter for closed fracture: Secondary | ICD-10-CM | POA: Diagnosis not present

## 2017-01-26 DIAGNOSIS — N179 Acute kidney failure, unspecified: Secondary | ICD-10-CM | POA: Diagnosis not present

## 2017-01-26 DIAGNOSIS — S72009A Fracture of unspecified part of neck of unspecified femur, initial encounter for closed fracture: Secondary | ICD-10-CM | POA: Diagnosis not present

## 2017-01-26 DIAGNOSIS — Z4789 Encounter for other orthopedic aftercare: Secondary | ICD-10-CM | POA: Diagnosis not present

## 2017-01-26 DIAGNOSIS — F411 Generalized anxiety disorder: Secondary | ICD-10-CM | POA: Diagnosis present

## 2017-01-26 DIAGNOSIS — K219 Gastro-esophageal reflux disease without esophagitis: Secondary | ICD-10-CM | POA: Diagnosis present

## 2017-01-26 DIAGNOSIS — Y831 Surgical operation with implant of artificial internal device as the cause of abnormal reaction of the patient, or of later complication, without mention of misadventure at the time of the procedure: Secondary | ICD-10-CM | POA: Diagnosis present

## 2017-01-26 DIAGNOSIS — D62 Acute posthemorrhagic anemia: Secondary | ICD-10-CM | POA: Diagnosis not present

## 2017-01-26 DIAGNOSIS — Z792 Long term (current) use of antibiotics: Secondary | ICD-10-CM | POA: Diagnosis not present

## 2017-01-26 DIAGNOSIS — Z7901 Long term (current) use of anticoagulants: Secondary | ICD-10-CM | POA: Diagnosis not present

## 2017-01-26 DIAGNOSIS — I480 Paroxysmal atrial fibrillation: Secondary | ICD-10-CM | POA: Diagnosis not present

## 2017-01-26 DIAGNOSIS — S72142A Displaced intertrochanteric fracture of left femur, initial encounter for closed fracture: Secondary | ICD-10-CM | POA: Diagnosis present

## 2017-01-26 DIAGNOSIS — I10 Essential (primary) hypertension: Secondary | ICD-10-CM

## 2017-01-26 DIAGNOSIS — I272 Pulmonary hypertension, unspecified: Secondary | ICD-10-CM | POA: Diagnosis present

## 2017-01-26 DIAGNOSIS — W010XXA Fall on same level from slipping, tripping and stumbling without subsequent striking against object, initial encounter: Secondary | ICD-10-CM | POA: Diagnosis present

## 2017-01-26 DIAGNOSIS — E1300 Other specified diabetes mellitus with hyperosmolarity without nonketotic hyperglycemic-hyperosmolar coma (NKHHC): Secondary | ICD-10-CM | POA: Diagnosis not present

## 2017-01-26 DIAGNOSIS — F5101 Primary insomnia: Secondary | ICD-10-CM | POA: Diagnosis not present

## 2017-01-26 DIAGNOSIS — Z7982 Long term (current) use of aspirin: Secondary | ICD-10-CM | POA: Diagnosis not present

## 2017-01-26 DIAGNOSIS — S72142K Displaced intertrochanteric fracture of left femur, subsequent encounter for closed fracture with nonunion: Secondary | ICD-10-CM | POA: Diagnosis present

## 2017-01-26 DIAGNOSIS — T8451XS Infection and inflammatory reaction due to internal right hip prosthesis, sequela: Secondary | ICD-10-CM | POA: Diagnosis not present

## 2017-01-26 DIAGNOSIS — A498 Other bacterial infections of unspecified site: Secondary | ICD-10-CM | POA: Diagnosis not present

## 2017-01-26 DIAGNOSIS — R2681 Unsteadiness on feet: Secondary | ICD-10-CM | POA: Diagnosis not present

## 2017-01-26 DIAGNOSIS — M25551 Pain in right hip: Secondary | ICD-10-CM | POA: Diagnosis not present

## 2017-01-26 DIAGNOSIS — K5909 Other constipation: Secondary | ICD-10-CM | POA: Diagnosis not present

## 2017-01-26 DIAGNOSIS — T847XXS Infection and inflammatory reaction due to other internal orthopedic prosthetic devices, implants and grafts, sequela: Secondary | ICD-10-CM | POA: Diagnosis not present

## 2017-01-26 HISTORY — PX: INTRAMEDULLARY (IM) NAIL INTERTROCHANTERIC: SHX5875

## 2017-01-26 LAB — CBC
HEMATOCRIT: 33.9 % — AB (ref 36.0–46.0)
Hemoglobin: 11.1 g/dL — ABNORMAL LOW (ref 12.0–15.0)
MCH: 30.3 pg (ref 26.0–34.0)
MCHC: 32.7 g/dL (ref 30.0–36.0)
MCV: 92.6 fL (ref 78.0–100.0)
PLATELETS: 211 10*3/uL (ref 150–400)
RBC: 3.66 MIL/uL — AB (ref 3.87–5.11)
RDW: 14.1 % (ref 11.5–15.5)
WBC: 12.8 10*3/uL — AB (ref 4.0–10.5)

## 2017-01-26 LAB — URINALYSIS, ROUTINE W REFLEX MICROSCOPIC
Bilirubin Urine: NEGATIVE
Glucose, UA: NEGATIVE mg/dL
Hgb urine dipstick: NEGATIVE
Ketones, ur: NEGATIVE mg/dL
Leukocytes, UA: NEGATIVE
Nitrite: NEGATIVE
Protein, ur: NEGATIVE mg/dL
Specific Gravity, Urine: 1.017 (ref 1.005–1.030)
pH: 5 (ref 5.0–8.0)

## 2017-01-26 LAB — BASIC METABOLIC PANEL
ANION GAP: 9 (ref 5–15)
BUN: 18 mg/dL (ref 6–20)
CO2: 26 mmol/L (ref 22–32)
Calcium: 9.6 mg/dL (ref 8.9–10.3)
Chloride: 99 mmol/L — ABNORMAL LOW (ref 101–111)
Creatinine, Ser: 1 mg/dL (ref 0.44–1.00)
GFR, EST AFRICAN AMERICAN: 55 mL/min — AB (ref >60)
GFR, EST NON AFRICAN AMERICAN: 47 mL/min — AB (ref >60)
Glucose, Bld: 186 mg/dL — ABNORMAL HIGH (ref 65–99)
POTASSIUM: 3.9 mmol/L (ref 3.5–5.1)
Sodium: 134 mmol/L — ABNORMAL LOW (ref 135–145)

## 2017-01-26 LAB — PROTIME-INR
INR: 1.06
Prothrombin Time: 13.8 seconds (ref 11.4–15.2)

## 2017-01-26 LAB — GLUCOSE, CAPILLARY: Glucose-Capillary: 124 mg/dL — ABNORMAL HIGH (ref 65–99)

## 2017-01-26 LAB — SURGICAL PCR SCREEN
MRSA, PCR: NEGATIVE
STAPHYLOCOCCUS AUREUS: NEGATIVE

## 2017-01-26 SURGERY — FIXATION, FRACTURE, INTERTROCHANTERIC, WITH INTRAMEDULLARY ROD
Anesthesia: General | Site: Hip | Laterality: Left

## 2017-01-26 MED ORDER — HYDROCODONE-ACETAMINOPHEN 5-325 MG PO TABS
1.0000 | ORAL_TABLET | Freq: Four times a day (QID) | ORAL | Status: DC | PRN
  Administered 2017-01-27: 2 via ORAL
  Administered 2017-01-30: 1 via ORAL
  Filled 2017-01-26: qty 2
  Filled 2017-01-26 (×2): qty 1

## 2017-01-26 MED ORDER — PANTOPRAZOLE SODIUM 40 MG PO TBEC
40.0000 mg | DELAYED_RELEASE_TABLET | Freq: Two times a day (BID) | ORAL | Status: DC
  Administered 2017-01-26 – 2017-01-30 (×9): 40 mg via ORAL
  Filled 2017-01-26 (×9): qty 1

## 2017-01-26 MED ORDER — ONDANSETRON HCL 4 MG/2ML IJ SOLN
INTRAMUSCULAR | Status: AC
  Filled 2017-01-26: qty 2

## 2017-01-26 MED ORDER — 0.9 % SODIUM CHLORIDE (POUR BTL) OPTIME
TOPICAL | Status: DC | PRN
  Administered 2017-01-26: 1000 mL

## 2017-01-26 MED ORDER — ALUM & MAG HYDROXIDE-SIMETH 200-200-20 MG/5ML PO SUSP: 30.0000 mL | ORAL | Status: DC | PRN

## 2017-01-26 MED ORDER — APIXABAN 2.5 MG PO TABS: 2.5000 mg | ORAL_TABLET | Freq: Two times a day (BID) | ORAL | 0 refills | Status: DC

## 2017-01-26 MED ORDER — PHENYLEPHRINE HCL 10 MG/ML IJ SOLN
INTRAVENOUS | Status: DC | PRN
  Administered 2017-01-26: 50 ug/min via INTRAVENOUS

## 2017-01-26 MED ORDER — APIXABAN 2.5 MG PO TABS
2.5000 mg | ORAL_TABLET | Freq: Two times a day (BID) | ORAL | Status: DC
  Administered 2017-01-27 – 2017-01-30 (×7): 2.5 mg via ORAL
  Filled 2017-01-26 (×7): qty 1

## 2017-01-26 MED ORDER — FLUOXETINE HCL 20 MG PO CAPS
40.0000 mg | ORAL_CAPSULE | Freq: Every day | ORAL | Status: DC
  Administered 2017-01-26 – 2017-01-30 (×5): 40 mg via ORAL
  Filled 2017-01-26 (×5): qty 2

## 2017-01-26 MED ORDER — LEVOTHYROXINE SODIUM 25 MCG PO TABS
125.0000 ug | ORAL_TABLET | Freq: Every day | ORAL | Status: DC
  Administered 2017-01-27 – 2017-01-30 (×4): 125 ug via ORAL
  Filled 2017-01-26 (×4): qty 1

## 2017-01-26 MED ORDER — CEFAZOLIN SODIUM-DEXTROSE 2-4 GM/100ML-% IV SOLN
2.0000 g | Freq: Four times a day (QID) | INTRAVENOUS | Status: AC
  Administered 2017-01-26 – 2017-01-27 (×3): 2 g via INTRAVENOUS
  Filled 2017-01-26 (×3): qty 100

## 2017-01-26 MED ORDER — TEMAZEPAM 15 MG PO CAPS
30.0000 mg | ORAL_CAPSULE | Freq: Every evening | ORAL | Status: DC | PRN
  Administered 2017-01-26 – 2017-01-29 (×5): 30 mg via ORAL
  Filled 2017-01-26 (×5): qty 2

## 2017-01-26 MED ORDER — SUGAMMADEX SODIUM 200 MG/2ML IV SOLN
INTRAVENOUS | Status: AC
  Filled 2017-01-26: qty 2

## 2017-01-26 MED ORDER — HYDROCODONE-ACETAMINOPHEN 5-325 MG PO TABS
1.0000 | ORAL_TABLET | Freq: Four times a day (QID) | ORAL | Status: DC | PRN
  Administered 2017-01-26: 1 via ORAL
  Administered 2017-01-26: 2 via ORAL
  Administered 2017-01-26: 1 via ORAL
  Administered 2017-01-27: 2 via ORAL
  Administered 2017-01-28: 1 via ORAL
  Administered 2017-01-28: 2 via ORAL
  Administered 2017-01-28: 1 via ORAL
  Filled 2017-01-26: qty 1
  Filled 2017-01-26 (×2): qty 2
  Filled 2017-01-26: qty 1
  Filled 2017-01-26: qty 2
  Filled 2017-01-26 (×2): qty 1

## 2017-01-26 MED ORDER — ACETAMINOPHEN 650 MG RE SUPP: 650.0000 mg | Freq: Four times a day (QID) | RECTAL | Status: DC | PRN

## 2017-01-26 MED ORDER — ACETAMINOPHEN 325 MG PO TABS: 650.0000 mg | ORAL_TABLET | Freq: Four times a day (QID) | ORAL | Status: DC | PRN

## 2017-01-26 MED ORDER — CEFAZOLIN SODIUM 1 G IJ SOLR
INTRAMUSCULAR | Status: DC | PRN
  Administered 2017-01-26: 2 g via INTRAMUSCULAR

## 2017-01-26 MED ORDER — HYDROCODONE-ACETAMINOPHEN 7.5-325 MG PO TABS: 1.0000 | ORAL_TABLET | Freq: Four times a day (QID) | ORAL | 0 refills | Status: DC | PRN

## 2017-01-26 MED ORDER — METHOCARBAMOL 1000 MG/10ML IJ SOLN
500.0000 mg | Freq: Four times a day (QID) | INTRAVENOUS | Status: DC | PRN
  Filled 2017-01-26: qty 5

## 2017-01-26 MED ORDER — ONDANSETRON HCL 4 MG PO TABS
4.0000 mg | ORAL_TABLET | Freq: Four times a day (QID) | ORAL | Status: DC | PRN
  Administered 2017-01-27: 4 mg via ORAL
  Filled 2017-01-26: qty 1

## 2017-01-26 MED ORDER — LIDOCAINE 2% (20 MG/ML) 5 ML SYRINGE
INTRAMUSCULAR | Status: AC
  Filled 2017-01-26: qty 10

## 2017-01-26 MED ORDER — SUGAMMADEX SODIUM 200 MG/2ML IV SOLN
INTRAVENOUS | Status: DC | PRN
Start: 2017-01-26 — End: 2017-01-26
  Administered 2017-01-26: 200 mg via INTRAVENOUS

## 2017-01-26 MED ORDER — PROPOFOL 10 MG/ML IV BOLUS
INTRAVENOUS | Status: AC
  Filled 2017-01-26: qty 20

## 2017-01-26 MED ORDER — FENTANYL CITRATE (PF) 100 MCG/2ML IJ SOLN: 50.0000 ug | INTRAMUSCULAR | Status: DC | PRN

## 2017-01-26 MED ORDER — ONDANSETRON HCL 4 MG/2ML IJ SOLN
4.0000 mg | Freq: Four times a day (QID) | INTRAMUSCULAR | Status: DC | PRN
Start: 2017-01-26 — End: 2017-01-30
  Administered 2017-01-29: 4 mg via INTRAVENOUS
  Filled 2017-01-26 (×2): qty 2

## 2017-01-26 MED ORDER — LACTATED RINGERS IV SOLN
INTRAVENOUS | Status: DC | PRN
  Administered 2017-01-26: 14:00:00 via INTRAVENOUS

## 2017-01-26 MED ORDER — ALBUMIN HUMAN 5 % IV SOLN
INTRAVENOUS | Status: DC | PRN
  Administered 2017-01-26: 16:00:00 via INTRAVENOUS

## 2017-01-26 MED ORDER — ONDANSETRON HCL 4 MG/2ML IJ SOLN
INTRAMUSCULAR | Status: DC | PRN
  Administered 2017-01-26: 4 mg via INTRAVENOUS

## 2017-01-26 MED ORDER — ASPIRIN EC 81 MG PO TBEC
81.0000 mg | DELAYED_RELEASE_TABLET | Freq: Every day | ORAL | Status: DC
  Administered 2017-01-27 – 2017-01-30 (×4): 81 mg via ORAL
  Filled 2017-01-26 (×4): qty 1

## 2017-01-26 MED ORDER — PHENOL 1.4 % MT LIQD
1.0000 | OROMUCOSAL | Status: DC | PRN
Start: 2017-01-26 — End: 2017-01-30

## 2017-01-26 MED ORDER — POTASSIUM CHLORIDE CRYS ER 10 MEQ PO TBCR: 10.0000 meq | EXTENDED_RELEASE_TABLET | ORAL | Status: DC | PRN

## 2017-01-26 MED ORDER — ROCURONIUM BROMIDE 50 MG/5ML IV SOSY
PREFILLED_SYRINGE | INTRAVENOUS | Status: AC
  Filled 2017-01-26: qty 5

## 2017-01-26 MED ORDER — PHENYLEPHRINE HCL 10 MG/ML IJ SOLN
INTRAMUSCULAR | Status: AC
  Filled 2017-01-26: qty 1

## 2017-01-26 MED ORDER — ROCURONIUM BROMIDE 100 MG/10ML IV SOLN
INTRAVENOUS | Status: DC | PRN
  Administered 2017-01-26: 40 mg via INTRAVENOUS

## 2017-01-26 MED ORDER — FENTANYL CITRATE (PF) 100 MCG/2ML IJ SOLN
INTRAMUSCULAR | Status: AC
  Filled 2017-01-26: qty 2

## 2017-01-26 MED ORDER — SODIUM CHLORIDE 0.9 % IV SOLN: INTRAVENOUS | Status: DC

## 2017-01-26 MED ORDER — METOPROLOL TARTRATE 50 MG PO TABS
50.0000 mg | ORAL_TABLET | Freq: Two times a day (BID) | ORAL | Status: DC
  Administered 2017-01-26 – 2017-01-30 (×9): 50 mg via ORAL
  Filled 2017-01-26 (×10): qty 1

## 2017-01-26 MED ORDER — PROPOFOL 10 MG/ML IV BOLUS
INTRAVENOUS | Status: DC | PRN
  Administered 2017-01-26: 80 mg via INTRAVENOUS

## 2017-01-26 MED ORDER — FENTANYL CITRATE (PF) 100 MCG/2ML IJ SOLN
INTRAMUSCULAR | Status: DC | PRN
  Administered 2017-01-26 (×2): 50 ug via INTRAVENOUS

## 2017-01-26 MED ORDER — SULFAMETHOXAZOLE-TRIMETHOPRIM 400-80 MG PO TABS
1.0000 | ORAL_TABLET | Freq: Two times a day (BID) | ORAL | Status: DC
  Administered 2017-01-26 – 2017-01-28 (×4): 1 via ORAL
  Filled 2017-01-26 (×5): qty 1

## 2017-01-26 MED ORDER — METHOCARBAMOL 500 MG PO TABS
500.0000 mg | ORAL_TABLET | Freq: Four times a day (QID) | ORAL | Status: DC | PRN
  Filled 2017-01-26 (×2): qty 1

## 2017-01-26 MED ORDER — PHENYLEPHRINE 40 MCG/ML (10ML) SYRINGE FOR IV PUSH (FOR BLOOD PRESSURE SUPPORT)
PREFILLED_SYRINGE | INTRAVENOUS | Status: AC
  Filled 2017-01-26: qty 10

## 2017-01-26 MED ORDER — CALCIUM CARBONATE 1250 (500 CA) MG PO TABS
1.0000 | ORAL_TABLET | Freq: Every evening | ORAL | Status: DC
  Administered 2017-01-26 – 2017-01-29 (×4): 500 mg via ORAL
  Filled 2017-01-26 (×4): qty 1

## 2017-01-26 MED ORDER — BENAZEPRIL HCL 20 MG PO TABS
40.0000 mg | ORAL_TABLET | Freq: Every evening | ORAL | Status: DC
  Administered 2017-01-26: 40 mg via ORAL
  Filled 2017-01-26: qty 2

## 2017-01-26 MED ORDER — METOCLOPRAMIDE HCL 5 MG PO TABS: 5.0000 mg | ORAL_TABLET | Freq: Three times a day (TID) | ORAL | Status: DC | PRN

## 2017-01-26 MED ORDER — ATORVASTATIN CALCIUM 80 MG PO TABS
80.0000 mg | ORAL_TABLET | Freq: Every day | ORAL | Status: DC
  Administered 2017-01-26 – 2017-01-29 (×4): 80 mg via ORAL
  Filled 2017-01-26 (×4): qty 1

## 2017-01-26 MED ORDER — PROSIGHT PO TABS
1.0000 | ORAL_TABLET | Freq: Two times a day (BID) | ORAL | Status: DC
  Administered 2017-01-26 – 2017-01-30 (×8): 1 via ORAL
  Filled 2017-01-26 (×8): qty 1

## 2017-01-26 MED ORDER — METOCLOPRAMIDE HCL 5 MG/ML IJ SOLN: 5.0000 mg | Freq: Three times a day (TID) | INTRAMUSCULAR | Status: DC | PRN

## 2017-01-26 MED ORDER — CHLORTHALIDONE 25 MG PO TABS
25.0000 mg | ORAL_TABLET | Freq: Every day | ORAL | Status: DC
  Filled 2017-01-26 (×2): qty 1

## 2017-01-26 MED ORDER — PHENYLEPHRINE HCL 10 MG/ML IJ SOLN
INTRAMUSCULAR | Status: DC | PRN
  Administered 2017-01-26 (×2): 120 ug via INTRAVENOUS
  Administered 2017-01-26 (×2): 80 ug via INTRAVENOUS

## 2017-01-26 MED ORDER — POLYETHYLENE GLYCOL 3350 17 G PO PACK
17.0000 g | PACK | ORAL | Status: DC
  Administered 2017-01-28: 17 g via ORAL
  Filled 2017-01-26: qty 1

## 2017-01-26 MED ORDER — FUROSEMIDE 20 MG PO TABS: 20.0000 mg | ORAL_TABLET | ORAL | Status: DC | PRN

## 2017-01-26 MED ORDER — MENTHOL 3 MG MT LOZG
1.0000 | LOZENGE | OROMUCOSAL | Status: DC | PRN
  Administered 2017-01-26: 3 mg via ORAL
  Filled 2017-01-26: qty 9

## 2017-01-26 MED ORDER — DILTIAZEM HCL ER COATED BEADS 180 MG PO CP24
180.0000 mg | ORAL_CAPSULE | Freq: Every day | ORAL | Status: DC
  Administered 2017-01-26 – 2017-01-30 (×4): 180 mg via ORAL
  Filled 2017-01-26 (×5): qty 1

## 2017-01-26 SURGICAL SUPPLY — 39 items
BIT DRILL SHORT 4.0 (BIT) IMPLANT
BNDG COHESIVE 4X5 TAN NS LF (GAUZE/BANDAGES/DRESSINGS) ×3 IMPLANT
BNDG COHESIVE 6X5 TAN STRL LF (GAUZE/BANDAGES/DRESSINGS) IMPLANT
BNDG GAUZE ELAST 4 BULKY (GAUZE/BANDAGES/DRESSINGS) ×3 IMPLANT
COVER PERINEAL POST (MISCELLANEOUS) ×3 IMPLANT
COVER SURGICAL LIGHT HANDLE (MISCELLANEOUS) ×5 IMPLANT
DRAPE STERI IOBAN 125X83 (DRAPES) ×3 IMPLANT
DRILL BIT SHORT 4.0 (BIT) ×3
DRSG MEPILEX BORDER 4X4 (GAUZE/BANDAGES/DRESSINGS) ×7 IMPLANT
DRSG MEPILEX BORDER 4X8 (GAUZE/BANDAGES/DRESSINGS) ×3 IMPLANT
DRSG PAD ABDOMINAL 8X10 ST (GAUZE/BANDAGES/DRESSINGS) ×6 IMPLANT
DURAPREP 26ML APPLICATOR (WOUND CARE) ×3 IMPLANT
ELECT REM PT RETURN 9FT ADLT (ELECTROSURGICAL) ×3
ELECTRODE REM PT RTRN 9FT ADLT (ELECTROSURGICAL) ×1 IMPLANT
GLOVE SKINSENSE NS SZ7.5 (GLOVE) ×4
GLOVE SKINSENSE STRL SZ7.5 (GLOVE) ×2 IMPLANT
GOWN STRL REIN XL XLG (GOWN DISPOSABLE) ×3 IMPLANT
GUIDE PIN 3.2X343 (PIN) ×2
GUIDE PIN 3.2X343MM (PIN) ×6
KIT BASIN OR (CUSTOM PROCEDURE TRAY) ×3 IMPLANT
KIT ROOM TURNOVER OR (KITS) ×3 IMPLANT
MANIFOLD NEPTUNE II (INSTRUMENTS) ×3 IMPLANT
NAIL LEFT 10X36 (Nail) ×2 IMPLANT
NS IRRIG 1000ML POUR BTL (IV SOLUTION) ×3 IMPLANT
PACK GENERAL/GYN (CUSTOM PROCEDURE TRAY) ×3 IMPLANT
PAD ARMBOARD 7.5X6 YLW CONV (MISCELLANEOUS) ×6 IMPLANT
PAD CAST 4YDX4 CTTN HI CHSV (CAST SUPPLIES) ×2 IMPLANT
PADDING CAST COTTON 4X4 STRL (CAST SUPPLIES) ×6
PIN GUIDE 3.2X343MM (PIN) IMPLANT
SCREW LAG COMPR KIT 100/95 (Screw) ×2 IMPLANT
SCREW TRIGEN LOW PROF 5.0X45 (Screw) ×2 IMPLANT
STAPLER VISISTAT 35W (STAPLE) ×3 IMPLANT
SUT VIC AB 0 CT1 27 (SUTURE) ×6
SUT VIC AB 0 CT1 27XBRD ANBCTR (SUTURE) ×1 IMPLANT
SUT VIC AB 2-0 CT1 27 (SUTURE) ×6
SUT VIC AB 2-0 CT1 TAPERPNT 27 (SUTURE) ×1 IMPLANT
TOWEL OR 17X24 6PK STRL BLUE (TOWEL DISPOSABLE) ×3 IMPLANT
TOWEL OR 17X26 10 PK STRL BLUE (TOWEL DISPOSABLE) ×3 IMPLANT
WATER STERILE IRR 1000ML POUR (IV SOLUTION) ×3 IMPLANT

## 2017-01-26 NOTE — Anesthesia Procedure Notes (Signed)
Procedure Name: Intubation Date/Time: 01/26/2017 3:36 PM Performed by: Candis Shine Pre-anesthesia Checklist: Patient identified, Emergency Drugs available, Suction available and Patient being monitored Patient Re-evaluated:Patient Re-evaluated prior to inductionOxygen Delivery Method: Circle System Utilized Preoxygenation: Pre-oxygenation with 100% oxygen Intubation Type: IV induction Ventilation: Mask ventilation without difficulty and Oral airway inserted - appropriate to patient size Laryngoscope Size: Mac and 3 Grade View: Grade I Tube type: Oral Tube size: 7.0 mm Number of attempts: 1 Airway Equipment and Method: Stylet and Oral airway Placement Confirmation: ETT inserted through vocal cords under direct vision,  positive ETCO2 and breath sounds checked- equal and bilateral Secured at: 21 cm Tube secured with: Tape Dental Injury: Teeth and Oropharynx as per pre-operative assessment

## 2017-01-26 NOTE — Progress Notes (Signed)
[facility] PLAN OF CARE NOTE Patient: Leah Holland [id number]   PCP: Jeanmarie Hubert, MD DOB: Jun 01, 1922   DOA: 01/25/2017   DOS: 01/26/2017    Patient was admitted by my colleague Dr. Tamala Julian earlier on 01/26/2017. I have reviewed the H&P as well as assessment and plan and agree with the same. Important changes in the plan are listed below.  Plan of care: Principal Problem:   Closed comminuted fracture of left hip (HCC) Active Problems:   Hypertension   Hyperlipidemia   Hypothyroidism   Paroxysmal atrial fibrillation (HCC)   Diabetes mellitus type 2, diet-controlled ([place])   Leukocytosis   Fall   Author: Berle Mull, MD [facility] Pager: [phone] 01/26/2017 4:01 PM   If 7PM-7AM, please contact night-coverage at [email or web address], password [facility]

## 2017-01-26 NOTE — Op Note (Signed)
   Date of Surgery: 01/26/2017  INDICATIONS: Leah Holland is a 83 y.o.-year-old female who sustained a left hip fracture. The risks and benefits of the procedure discussed with the patient prior to the procedure and all questions were answered; consent was obtained.  PREOPERATIVE DIAGNOSIS: left hip fracture   POSTOPERATIVE DIAGNOSIS: Same   PROCEDURE: Treatment of intertrochanteric fracture with intramedullary implant. CPT 319 [id number] [id number]   SURGEON: N. Eduard Roux, M.D.   ANESTHESIA: general   IV FLUIDS AND URINE: See anesthesia record   ESTIMATED BLOOD LOSS: 200 cc  IMPLANTS: [employer] InterTAN 10 x 36, 100/95  DRAINS: None.   COMPLICATIONS: None.   DESCRIPTION OF PROCEDURE: The patient was brought to the operating room and placed supine on the operating table. The patient's leg had been signed prior to the procedure. The patient had the anesthesia placed by the anesthesiologist. The prep verification and incision time-outs were performed to confirm that this was the correct patient, site, side and location. The patient had an SCD on the opposite lower extremity. The patient did receive antibiotics prior to the incision and was re-dosed during the procedure as needed at indicated intervals. The patient was positioned on the fracture table with the table in traction and internal rotation to reduce the hip. The well leg was placed in a scissor position and all bony prominences were well-padded. The patient had the lower extremity prepped and draped in the standard surgical fashion. The incision was made 4 finger breadths superior to the greater trochanter. A guide pin was inserted into the tip of the greater trochanter under fluoroscopic guidance. An opening reamer was used to gain access to the femoral canal. The nail length was measured and inserted down the femoral canal to its proper depth. The appropriate version of insertion for the lag screw was found under fluoroscopy. A pin was inserted  up the femoral neck through the jig. Then, a second antirotation pin was inserted inferior to the first pin. The length of the lag screw was then measured. The lag screw was inserted as near to center-center in the head as possible. The antirotation pin was then taken out and an interdigitating compression screw was placed in its place. The leg was taken out of traction, then the interdigitating compression screw was used to compress across the fracture. Compression was visualized on serial xrays. A distal interlocking screw in the static slot was placed using perfect circle technique.  The wound was copiously irrigated with saline and the subcutaneous layer closed with 2.0 vicryl and the skin was reapproximated with staples. The wounds were cleaned and dried a final time and a sterile dressing was placed. The hip was taken through a range of motion at the end of the case under fluoroscopic imaging to visualize the approach-withdraw phenomenon and confirm implant length in the head. The patient was then awakened from anesthesia and taken to the recovery room in stable condition. All counts were correct at the end of the case.   POSTOPERATIVE PLAN: The patient will be weight bearing as tolerated and will return in 2 weeks for staple removal and the patient will receive DVT prophylaxis based on other medications, activity level, and risk ratio of bleeding to thrombosis.   Leah Cecil, MD [place] 4:24 PM

## 2017-01-26 NOTE — Anesthesia Preprocedure Evaluation (Addendum)
Anesthesia Evaluation  Patient identified by MRN, date of birth, ID band Patient awake    Reviewed: Allergy & Precautions, NPO status , Patient's Chart, lab work & pertinent test results  History of Anesthesia Complications (+) PONV  Airway Mallampati: II  TM Distance: >3 FB Neck ROM: Full    Dental  (+) Edentulous Upper, Edentulous Lower   Pulmonary neg pulmonary ROS,    breath sounds clear to auscultation       Cardiovascular hypertension, Pt. on medications and Pt. on home beta blockers (-) angina+ dysrhythmias  Rhythm:Regular Rate:Normal   She had a Myoview stress test performed in our office on August 07, 2011, which was low risk.   Neuro/Psych negative neurological ROS     GI/Hepatic Neg liver ROS, GERD  Controlled,  Endo/Other  diabetes (glu 186)Hypothyroidism   Renal/GU negative Renal ROS     Musculoskeletal   Abdominal   Peds  Hematology  (+) Blood dyscrasia (eliquis), ,   Anesthesia Other Findings   Reproductive/Obstetrics                            Anesthesia Physical Anesthesia Plan  ASA: III  Anesthesia Plan: General   Post-op Pain Management:    Induction: Intravenous  Airway Management Planned: Oral ETT  Additional Equipment:   Intra-op Plan:   Post-operative Plan: Extubation in OR  Informed Consent: I have reviewed the patients History and Physical, chart, labs and discussed the procedure including the risks, benefits and alternatives for the proposed anesthesia with the patient or authorized representative who has indicated his/her understanding and acceptance.     Plan Discussed with: CRNA and Surgeon  Anesthesia Plan Comments: (Plan routine monitors, GETA)        Anesthesia Quick Evaluation

## 2017-01-26 NOTE — Anesthesia Postprocedure Evaluation (Signed)
Anesthesia Post Note  Patient: Leah Holland  Procedure(s) Performed: Procedure(s) (LRB): INTRAMEDULLARY (IM) NAIL INTERTROCHANTRIC (Left)  Patient location during evaluation: PACU Anesthesia Type: General Level of consciousness: awake and alert Pain management: pain level controlled Vital Signs Assessment: post-procedure vital signs reviewed and stable Respiratory status: spontaneous breathing, nonlabored ventilation, respiratory function stable and patient connected to nasal cannula oxygen Cardiovascular status: blood pressure returned to baseline and stable Postop Assessment: no signs of nausea or vomiting Anesthetic complications: no       Last Vitals:  Vitals:   01/26/17 1705 01/26/17 1720  BP: (!) 161/59 (!) 152/56  Pulse: 66 63  Resp: 13 15  Temp:      Last Pain:  Vitals:   01/26/17 1700  TempSrc:   PainSc: 0-No pain                 Catalina Gravel

## 2017-01-26 NOTE — H&P (Signed)
History and Physical    Leah Holland [id number] DOB: [birth year] DOA: 01/25/2017  Referring MD/NP/PA: Yolande Jolly PCP: Jeanmarie Hubert, MD  Patient coming from: [facility]     Chief Complaint: Fall  HPI: Leah Holland is a 83 y.o. female with medical history significant of HTN, HLD, hypothyroidism, A. fib on Eliquis; who presents after having a fall. Patient notes that she was coming back to her apartment in independent living when she realized last night that she left her car door unlocked. In trying to leave one of the [employer] (so that she did not have to walk away around the building to get back in she tripped and landed on her left hip. Complaints of significant pain in the affected hip that she rates as 8 out of 10 and unable to bear any weight on the hip. Pain worsen with any kind of movement. Previously reports sustaining a right hip fracture back in 03/2016 requiring repair performed by Dr. [name]. At baseline patient notes being very active and is able to complete all of her ADLs without assistance. She had been in her normal state of health. Denies any chest pain, shortness of breath, palpitations, lightheadedness, loss consciousness, nausea, vomiting, diarrhea, cough, fever, chills, or dysuria symptoms.   ED Course: Upon admission into the emergency department patient was seen to have vitals signs relatively within normal limits. Lab work revealed WBC 15.3, sodium 133, potassium 3.6, chloride 97, CO2 24, BUN 18, creatinine 1.11, glucose 179. X-rays of the head revealed a left communicated intertrochanter femur fracture.  CT imaging of the brain and cervical spine show no acute fractures. Dr. Ronnie Derby of orthopedics was consulted and they will see patient in the a.m.   afebrile Review of Systems: As per HPI otherwise 10 point review of systems negative.   Past Medical History:  Diagnosis Date  . Anxiety attack    "take RX prn" (08/13/2013)  . Arthritis    "probably"  (08/13/2013)  . Atrial fibrillation ([place])    new onset/pt report 08/13/2013  . Bronchitis 12/2011  . Complication of anesthesia   . Depression   . Diabetes mellitus type 2, diet-controlled ([place])   . GERD (gastroesophageal reflux disease)   . H/O hiatal hernia   . Hardware complicating wound infection ([place]) 08/22/2016  . History of blood transfusion    "w/both knee OR's" (08/13/2013)  . Hyperlipidemia   . Hyperlipidemia   . Hypertension   . Hypothyroidism    takes synthroid  . Insomnia   . Intertrochanteric fracture of right hip ([place]) 07/30/2015  . Neuromuscular disorder ([place])    carpal tunnel in right hand  . Periprosthetic fracture around internal prosthetic right hip joint ([place]), nonunion basicervical femoral neck with broken hardware and subtrochanteric fracture 03/16/2016  . PONV (postoperative nausea and vomiting)   . 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 Proteus infection 08/22/2016  . Serratia infection 11/14/2016  . Skin cancer of face 2000's   "one on each side of my face and one on my nose" (08/13/2013)  . Swelling of extremity    sees Dr. Gwenlyn Found, cardiologist 1 x year [phone]  . Tendon dysfunction 08/22/2016    Past Surgical History:  Procedure Laterality Date  . 2D ECHOCARDIOGRAM  12/07/2004   EF 48%, moderate pulmonary hypertension  . BREAST BIOPSY Left ?1970's  . CARDIOVASCULAR STRESS TEST  12/07/11  . CARDIOVASCULAR STRESS TEST  12/07/2011   LEXISCAN, normal scan, no significant wall abnormalities noted  .  CARPAL TUNNEL RELEASE Right ~ 2005  . CATARACT EXTRACTION, BILATERAL Bilateral 2000  . DILATION AND CURETTAGE OF UTERUS  1950's  . DOPPLER ECHOCARDIOGRAPHY  12/07/11  . EYE SURGERY    . FEMUR IM NAIL Right 08/01/2015   Procedure: INTRAMEDULLARY (IM) NAIL FEMORAL;  Surgeon: Marchia Bond, MD;  Location: [facility];  Service: Orthopedics;  Laterality: Right;  . HARDWARE REMOVAL Right 03/16/2016   Procedure: HARDWARE REMOVAL;  Surgeon: Marchia Bond, MD;  Location: [place];  Service: Orthopedics;  Laterality: Right;  . JOINT REPLACEMENT     bilateral knee replacement  . SKIN CANCER EXCISION  2000's   "off my nose" (08/13/2013)  . TOTAL HIP ARTHROPLASTY Right 03/16/2016   Procedure: TOTAL HIP ARTHROPLASTY;  Surgeon: Marchia Bond, MD;  Location: [facility];  Service: Orthopedics;  Laterality: Right;  . TOTAL KNEE ARTHROPLASTY Right 2005  . TOTAL KNEE ARTHROPLASTY  03/02/2012   Procedure: TOTAL KNEE ARTHROPLASTY;  Surgeon: Yvette Rack., MD;  Location: [facility];  Service: Orthopedics;  Laterality: Left;     reports that she has never smoked. She has never used smokeless tobacco. She reports that she does not drink alcohol or use drugs.  Allergies  Allergen Reactions  . Levaquin [Levofloxacin] Other (See Comments)    tendinopathy  . Morphine And Related Other (See Comments)    Made pt "wild"    Family History  Problem Relation Age of Onset  . Arrhythmia Mother   . Heart disease Mother   . Arrhythmia Sister   . Stroke Father 78  . Heart disease Brother   . Heart disease Brother   . Cancer Brother     Colon cancer  . Arrhythmia Brother   . Heart disease Brother   . Heart disease Sister   . Heart disease Sister   . Stroke Sister   . Diabetes Sister   . Heart disease Child 14  . Heart disease Child 43    Prior to Admission medications   Medication Sig Start Date End Date Taking? Authorizing Provider  acetaminophen (TYLENOL) 325 MG tablet Take 650 mg by mouth every 6 (six) hours as needed for moderate pain.    Yes Historical Provider, MD  aspirin EC 81 MG tablet Take 81 mg by mouth daily.   Yes Historical Provider, MD  atorvastatin (LIPITOR) 80 MG tablet Take 80 mg by mouth daily.   Yes Historical Provider, MD  benazepril (LOTENSIN) 40 MG tablet Take 1 tablet (40 mg total) by mouth every evening. 08/06/15  Yes Thurnell Lose, MD  calcium carbonate (OS-CAL - DOSED IN MG OF ELEMENTAL CALCIUM) 1250 (500 Ca) MG tablet Take 1 tablet by mouth every evening.    Yes Historical Provider, MD  chlorthalidone (HYGROTON) 25 MG tablet Take 1 tablet (25 mg total) by mouth daily. 10/18/16  Yes Lorretta Harp, MD  Cholecalciferol 2000 units CAPS Take 2,000 Units by mouth every evening.   Yes Historical Provider, MD  denosumab (PROLIA) 60 MG/ML SOLN injection Inject 60 mg into the skin every 6 (six) months. Administer in upper arm, thigh, or abdomen   Yes Historical Provider, MD  diltiazem (CARDIZEM CD) 180 MG 24 hr capsule Take 1 capsule (180 mg total) by mouth daily. 10/10/16  Yes Lorretta Harp, MD  ELIQUIS 2.5 MG TABS tablet TAKE 1 TABLET TWICE DAILY. 11/21/16  Yes Lorretta Harp, MD  esomeprazole (NEXIUM) 20 MG capsule Take 20 mg by mouth daily at 12 noon.   Yes  Historical Provider, MD  Flaxseed, Linseed, (FLAXSEED OIL) 1000 MG CAPS 1 pill po bid Patient taking differently: Take 1,000 mg by mouth 2 (two) times daily.  08/03/15  Yes Thurnell Lose, MD  FLUoxetine (PROZAC) 40 MG capsule Take 40 mg by mouth daily.  03/03/16  Yes Historical Provider, MD  furosemide (LASIX) 20 MG tablet Take 1 tablet (20 mg total) by mouth as needed for edema. 06/08/16  Yes Lorretta Harp, MD  metoprolol (LOPRESSOR) 50 MG tablet Take 50 mg by mouth 2 (two) times daily.   Yes Historical Provider, MD  Multiple Vitamins-Minerals (MACULAR VITAMIN BENEFIT) TABS 1 pill by mouth twice a day. There is no dose for multivitamin. Patient taking differently: Take 1 tablet by mouth 2 (two) times daily. Macular Protect 08/03/15  Yes Thurnell Lose, MD  polyethylene glycol (MIRALAX / GLYCOLAX) packet Take 17 g by mouth every other day. Mix in 8 oz water and drink   Yes Historical Provider, MD  potassium chloride (K-DUR) 10 MEQ tablet Take 1 tablet (10 mEq total) by mouth as needed. Patient taking differently: Take 10 mEq by mouth as needed (takes with lasix only).  06/28/16  Yes Lorretta Harp, MD  sulfamethoxazole-trimethoprim (BACTRIM) 400-80 MG tablet Take 1 tablet by mouth 2 (two)  times daily. 08/22/16  Yes Truman Hayward, MD  SYNTHROID 125 MCG tablet TAKE 1 TABLET ONCE DAILY BEFORE BREAKFAST. 07/04/16  Yes Estill Dooms, MD  temazepam (RESTORIL) 30 MG capsule Take 1 capsule (30 mg total) by mouth at bedtime as needed for sleep. 08/03/15  Yes Thurnell Lose, MD    Physical Exam:   Constitutional: Elderly female who is in mild discomfort  Vitals:   01/25/17 2330 01/26/17 0030 01/26/17 0045 01/26/17 0100  BP: 185/57 165/62 (!) 145/53 156/60  Pulse: 64 63 64 62  SpO2: 96% 96% 98% 96%   Eyes: PERRL, lids and conjunctivae normal ENMT: Mucous membranes are moist. Posterior pharynx clear of any exudate or lesions.Normal dentition.  Neck: normal, supple, no masses, no thyromegaly Respiratory: clear to auscultation bilaterally, no wheezing, no crackles. Normal respiratory effort. No accessory muscle use.  Cardiovascular: Regular rate and rhythm, no murmurs / rubs / gallops. No extremity edema. 2+ pedal pulses. No carotid bruits.  Abdomen: no tenderness, no masses palpated. No hepatosplenomegaly. Bowel sounds positive.  Musculoskeletal: no clubbing / cyanosis. Left hip as well as shortened and externally rotated.  Skin: no rashes, lesions, ulcers. No induration Neurologic: CN 2-12 grossly intact. Sensation intact, DTR normal. Strength 5/5 in all 4.  Psychiatric: Normal judgment and insight. Alert and oriented x 3. Normal mood.     Labs on Admission: I have personally reviewed following labs and imaging studies  CBC:  Recent Labs Lab 01/25/17 2130  WBC 15.3*  NEUTROABS 12.9*  HGB 12.3  HCT 36.7  MCV 92.7  PLT [id number]   Basic Metabolic Panel:  Recent Labs Lab 01/25/17 2130  NA 133*  K 3.6  CL 97*  CO2 24  GLUCOSE 179*  BUN 18  CREATININE 1.10*  CALCIUM 9.9   GFR: CrCl cannot be calculated (Unknown ideal weight.). Liver Function Tests: No results for input(s): AST, ALT, ALKPHOS, BILITOT, PROT, ALBUMIN in the last 168 hours. No results for  input(s): LIPASE, AMYLASE in the last 168 hours. No results for input(s): AMMONIA in the last 168 hours. Coagulation Profile: No results for input(s): INR, PROTIME in the last 168 hours. Cardiac Enzymes: No results for input(s): CKTOTAL,  CKMB, CKMBINDEX, TROPONINI in the last 168 hours. BNP (last 3 results) No results for input(s): PROBNP in the last 8760 hours. HbA1C: No results for input(s): HGBA1C in the last 72 hours. CBG: No results for input(s): GLUCAP in the last 168 hours. Lipid Profile: No results for input(s): CHOL, HDL, LDLCALC, TRIG, CHOLHDL, LDLDIRECT in the last 72 hours. Thyroid Function Tests: No results for input(s): TSH, T4TOTAL, FREET4, T3FREE, THYROIDAB in the last 72 hours. Anemia Panel: No results for input(s): VITAMINB12, FOLATE, FERRITIN, TIBC, IRON, RETICCTPCT in the last 72 hours. Urine analysis:    Component Value Date/Time   COLORURINE YELLOW 03/16/2016 [place] 03/16/2016 1112   LABSPEC 1.016 03/16/2016 1112   PHURINE 7.5 03/16/2016 1112   GLUCOSEU NEGATIVE 03/16/2016 1112   HGBUR NEGATIVE 03/16/2016 1112   BILIRUBINUR NEGATIVE 03/16/2016 1112   KETONESUR 15 (A) 03/16/2016 1112   PROTEINUR NEGATIVE 03/16/2016 1112   UROBILINOGEN 0.2 08/02/2015 0909   NITRITE NEGATIVE 03/16/2016 1112   LEUKOCYTESUR NEGATIVE 03/16/2016 1112   Sepsis Labs: No results found for this or any previous visit (from the past 240 hour(s)).   Radiological Exams on Admission: Dg Chest 2 View  Result Date: 01/25/2017 CLINICAL DATA:  Status post fall.  Congestion.  Initial encounter. EXAM: CHEST  2 VIEW COMPARISON:  Chest radiograph performed 03/16/2016 FINDINGS: The lungs are well-aerated. Minimal left basilar atelectasis is noted. There is no evidence of pleural effusion or pneumothorax. The heart is borderline enlarged. No acute osseous abnormalities are seen. Scattered vascular calcifications are seen. IMPRESSION: Borderline cardiomegaly.  Minimal left  basilar atelectasis noted. Electronically Signed   By: Garald Balding M.D.   On: 01/25/2017 23:59   Ct Head Wo Contrast  Result Date: 01/25/2017 CLINICAL DATA:  Fall EXAM: CT HEAD WITHOUT CONTRAST CT CERVICAL SPINE WITHOUT CONTRAST TECHNIQUE: Multidetector CT imaging of the head and cervical spine was performed following the standard protocol without intravenous contrast. Multiplanar CT image reconstructions of the cervical spine were also generated. COMPARISON:  None. FINDINGS: CT HEAD FINDINGS Brain: No mass lesion, intraparenchymal hemorrhage or extra-axial collection. No evidence of acute cortical infarct. Brain parenchyma and CSF-containing spaces are normal for age. Vascular: No hyperdense vessel or unexpected calcification. Skull: Normal visualized skull base, calvarium and extracranial soft tissues. Sinuses/Orbits: No sinus fluid levels or advanced mucosal thickening. No mastoid effusion. Normal orbits. CT CERVICAL SPINE FINDINGS Alignment: Grade 1 anterolisthesis at C2-C3. Facets are aligned. Occipital condyles are normally positioned. Skull base and vertebrae: No acute fracture. Soft tissues and spinal canal: No prevertebral fluid or swelling. No visible canal hematoma. Disc levels: Multilevel severe disc space loss with osteophyte formation and facet hypertrophy. Severe left C5-6 foraminal stenosis. No bony spinal canal stenosis. Upper chest: No pneumothorax, pulmonary nodule or pleural effusion. Other: Normal visualized paraspinal cervical soft tissues. IMPRESSION: 1. No acute intracranial abnormality. 2. No acute fracture of the cervical spine. 3. Marked cervical degenerative disc disease and facet hypertrophy. Severe left C5-6 foraminal stenosis. Electronically Signed   By: Ulyses Jarred M.D.   On: 01/25/2017 22:31   Ct Cervical Spine Wo Contrast  Result Date: 01/25/2017 CLINICAL DATA:  Fall EXAM: CT HEAD WITHOUT CONTRAST CT CERVICAL SPINE WITHOUT CONTRAST TECHNIQUE: Multidetector CT imaging of  the head and cervical spine was performed following the standard protocol without intravenous contrast. Multiplanar CT image reconstructions of the cervical spine were also generated. COMPARISON:  None. FINDINGS: CT HEAD FINDINGS Brain: No mass lesion, intraparenchymal hemorrhage or extra-axial collection. No evidence  of acute cortical infarct. Brain parenchyma and CSF-containing spaces are normal for age. Vascular: No hyperdense vessel or unexpected calcification. Skull: Normal visualized skull base, calvarium and extracranial soft tissues. Sinuses/Orbits: No sinus fluid levels or advanced mucosal thickening. No mastoid effusion. Normal orbits. CT CERVICAL SPINE FINDINGS Alignment: Grade 1 anterolisthesis at C2-C3. Facets are aligned. Occipital condyles are normally positioned. Skull base and vertebrae: No acute fracture. Soft tissues and spinal canal: No prevertebral fluid or swelling. No visible canal hematoma. Disc levels: Multilevel severe disc space loss with osteophyte formation and facet hypertrophy. Severe left C5-6 foraminal stenosis. No bony spinal canal stenosis. Upper chest: No pneumothorax, pulmonary nodule or pleural effusion. Other: Normal visualized paraspinal cervical soft tissues. IMPRESSION: 1. No acute intracranial abnormality. 2. No acute fracture of the cervical spine. 3. Marked cervical degenerative disc disease and facet hypertrophy. Severe left C5-6 foraminal stenosis. Electronically Signed   By: Ulyses Jarred M.D.   On: 01/25/2017 22:31   Dg Hip Unilat W Or Wo Pelvis 2-3 Views Left  Result Date: 01/25/2017 CLINICAL DATA:  Status post fall, with generalized left hip pain. Initial encounter. EXAM: DG HIP (WITH OR WITHOUT PELVIS) 2-3V LEFT COMPARISON:  None. FINDINGS: There is a comminuted left femoral intertrochanteric fracture, with shortening, and displacement of a lesser trochanteric fragment. The patient's right hip arthroplasty is grossly unremarkable in appearance, though  incompletely characterized, without evidence of loosening. The left femoral head remains seated at the acetabulum, with minimal superior joint space narrowing noted. The sacroiliac joints are unremarkable in appearance. The visualized bowel gas pattern is grossly unremarkable in appearance. IMPRESSION: Comminuted left femoral intertrochanteric fracture, with shortening, and displacement of a lesser trochanteric fragment. Electronically Signed   By: Garald Balding M.D.   On: 01/25/2017 22:11    EKG: Independently reviewed. ordered Assessment/Plan Fall with communicated left femoral intertrochanteric fracture: Acute. - Admit to telemetry bed - Hip fracture protocol initiated  - Hydrocodone/fentanyl prn moderate to severe pain respectively - Appreciate orthopedic consultative services, will follow-up for further recommendations  Leukocytosis: WBC elevated at 15.3. Patient denies having any infectious symptoms. - Follow-up urinalysis - Recheck CBC in a.m.  Atrial fibrillation on chronic anticoagulation therapy  - Continue diltiazem, metoprolol - Eliquis on Hold   Essential hypertension - Continue benazepril, chlorthalidone , and all other blood pressure medications as seen above  S/p right hip fracture: Previously right hip repair performed by Dr. [name] in 03/2016. 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 - Continue Bactrim    Hypothyroidism - Continue Synthroid  GERD - Continued pharmacy substitution of Protonix for Nexium  Osteoporosis: Patient noted to be on Prolia  Hyperlipidemia - Continue atorvastatin   Hyponatremia: Chronic. Sodium 133 - Continue to monitor  Diabetes mellitus type 2 diet controlled - Continue to monitor  Insomnia - Continue Restoril  DVT prophylaxis: Eliquis on hold  Code Status: Full Family Communication: No family present at bedside Disposition Plan: TBD  Consults called:  Orthopedics  Admission status: Inpatient  Norval Morton MD [facility] Pager [phone]  If 7PM-7AM, please contact night-coverage [email or web address] Password [email or web address]  01/26/2017, 1:18 AM

## 2017-01-26 NOTE — Transfer of Care (Signed)
Immediate Anesthesia Transfer of Care Note  Patient: Leah Holland  Procedure(s) Performed: Procedure(s): INTRAMEDULLARY (IM) NAIL INTERTROCHANTRIC (Left)  Patient Location: PACU  Anesthesia Type:General  Level of Consciousness: awake, alert  and oriented  Airway & Oxygen Therapy: Patient Spontanous Breathing and Patient connected to nasal cannula oxygen  Post-op Assessment: Report given to RN and Post -op Vital signs reviewed and stable  Post vital signs: Reviewed and stable  Last Vitals:  Vitals:   01/26/17 0643 01/26/17 1300  BP: 134/64 (!) 127/57  Pulse: 64 67  Resp:  16  Temp: 37.1 C 37.7 C    Last Pain:  Vitals:   01/26/17 1300  TempSrc: Oral  PainSc:          Complications: No apparent anesthesia complications

## 2017-01-26 NOTE — Consult Note (Signed)
ORTHOPAEDIC CONSULTATION  REQUESTING PHYSICIAN: Lavina Hamman, MD  Chief Complaint: Left hip fracture  HPI: Leah Holland is a 83 y.o. female who presents with left hip fracture s/p mechanical fall PTA.  The patient endorses severe pain in the left hip, that does not radiate, grinding in quality, worse with any movement, better with immobilization.  Denies LOC/fever/chills/nausea/vomiting.  Walks with assistive devices (walker, cane, wheelchair).  Does not live independently.  [facility].  Denies LOC, neck pain, abd pain.  Patient on lifetime suppressive abx for chronic infection of right hip.    Past Medical History:  Diagnosis Date  . Anxiety attack    "take RX prn" (08/13/2013)  . Arthritis    "probably" (08/13/2013)  . Atrial fibrillation ([place])    new onset/pt report 08/13/2013  . Bronchitis 12/2011  . Complication of anesthesia   . Depression   . Diabetes mellitus type 2, diet-controlled ([place])   . GERD (gastroesophageal reflux disease)   . H/O hiatal hernia   . Hardware complicating wound infection ([place]) 08/22/2016  . History of blood transfusion    "w/both knee OR's" (08/13/2013)  . Hyperlipidemia   . Hyperlipidemia   . Hypertension   . Hypothyroidism    takes synthroid  . Insomnia   . Intertrochanteric fracture of right hip ([place]) 07/30/2015  . Neuromuscular disorder ([place])    carpal tunnel in right hand  . Periprosthetic fracture around internal prosthetic right hip joint ([place]), nonunion basicervical femoral neck with broken hardware and subtrochanteric fracture 03/16/2016  . PONV (postoperative nausea and vomiting)   . 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 Proteus infection 08/22/2016  . Serratia infection 11/14/2016  . Skin cancer of face 2000's   "one on each side of my face and one on my nose" (08/13/2013)  . Swelling of extremity    sees Dr. Gwenlyn Found, cardiologist 1 x year [phone]  . Tendon dysfunction 08/22/2016   Past Surgical  History:  Procedure Laterality Date  . 2D ECHOCARDIOGRAM  12/07/2004   EF 48%, moderate pulmonary hypertension  . BREAST BIOPSY Left ?1970's  . CARDIOVASCULAR STRESS TEST  12/07/11  . CARDIOVASCULAR STRESS TEST  12/07/2011   LEXISCAN, normal scan, no significant wall abnormalities noted  . CARPAL TUNNEL RELEASE Right ~ 2005  . CATARACT EXTRACTION, BILATERAL Bilateral 2000  . DILATION AND CURETTAGE OF UTERUS  1950's  . DOPPLER ECHOCARDIOGRAPHY  12/07/11  . EYE SURGERY    . FEMUR IM NAIL Right 08/01/2015   Procedure: INTRAMEDULLARY (IM) NAIL FEMORAL;  Surgeon: Marchia Bond, MD;  Location: [facility];  Service: Orthopedics;  Laterality: Right;  . HARDWARE REMOVAL Right 03/16/2016   Procedure: HARDWARE REMOVAL;  Surgeon: Marchia Bond, MD;  Location: [facility];  Service: Orthopedics;  Laterality: Right;  . JOINT REPLACEMENT     bilateral knee replacement  . SKIN CANCER EXCISION  2000's   "off my nose" (08/13/2013)  . TOTAL HIP ARTHROPLASTY Right 03/16/2016   Procedure: TOTAL HIP ARTHROPLASTY;  Surgeon: Marchia Bond, MD;  Location: [facility];  Service: Orthopedics;  Laterality: Right;  . TOTAL KNEE ARTHROPLASTY Right 2005  . TOTAL KNEE ARTHROPLASTY  03/02/2012   Procedure: TOTAL KNEE ARTHROPLASTY;  Surgeon: Yvette Rack., MD;  Location: [facility];  Service: Orthopedics;  Laterality: Left;   Social History   Social History  . Marital status: Widowed    Spouse name: N/A  . Number of children: N/A  . Years of education: N/A  Social History Main Topics  . Smoking status: Never Smoker  . Smokeless tobacco: Never Used  . Alcohol use No  . Drug use: No  . Sexual activity: No   Other Topics Concern  . None   Social History Narrative   Widowed. Lives at [facility]. Son died in the [place] War and she has not slept well since then.   Family History  Problem Relation Age of Onset  . Arrhythmia Mother   . Heart disease Mother   . Arrhythmia Sister   . Stroke Father 63  . Heart disease Brother    . Heart disease Brother   . Cancer Brother     Colon cancer  . Arrhythmia Brother   . Heart disease Brother   . Heart disease Sister   . Heart disease Sister   . Stroke Sister   . Diabetes Sister   . Heart disease Child 83  . Heart disease Child 43   Allergies  Allergen Reactions  . Levaquin [Levofloxacin] Other (See Comments)    tendinopathy  . Morphine And Related Other (See Comments)    Made pt "wild"   Prior to Admission medications   Medication Sig Start Date End Date Taking? Authorizing Provider  acetaminophen (TYLENOL) 325 MG tablet Take 650 mg by mouth every 6 (six) hours as needed for moderate pain.    Yes Historical Provider, MD  aspirin EC 81 MG tablet Take 81 mg by mouth daily.   Yes Historical Provider, MD  atorvastatin (LIPITOR) 80 MG tablet Take 80 mg by mouth daily.   Yes Historical Provider, MD  benazepril (LOTENSIN) 40 MG tablet Take 1 tablet (40 mg total) by mouth every evening. 08/06/15  Yes Thurnell Lose, MD  calcium carbonate (OS-CAL - DOSED IN MG OF ELEMENTAL CALCIUM) 1250 (500 Ca) MG tablet Take 1 tablet by mouth every evening.   Yes Historical Provider, MD  chlorthalidone (HYGROTON) 25 MG tablet Take 1 tablet (25 mg total) by mouth daily. 10/18/16  Yes Lorretta Harp, MD  Cholecalciferol 2000 units CAPS Take 2,000 Units by mouth every evening.   Yes Historical Provider, MD  denosumab (PROLIA) 60 MG/ML SOLN injection Inject 60 mg into the skin every 6 (six) months. Administer in upper arm, thigh, or abdomen   Yes Historical Provider, MD  diltiazem (CARDIZEM CD) 180 MG 24 hr capsule Take 1 capsule (180 mg total) by mouth daily. 10/10/16  Yes Lorretta Harp, MD  ELIQUIS 2.5 MG TABS tablet TAKE 1 TABLET TWICE DAILY. 11/21/16  Yes Lorretta Harp, MD  esomeprazole (NEXIUM) 20 MG capsule Take 20 mg by mouth daily at 12 noon.   Yes Historical Provider, MD  Flaxseed, Linseed, (FLAXSEED OIL) 1000 MG CAPS 1 pill po bid Patient taking differently: Take 1,000 mg  by mouth 2 (two) times daily.  08/03/15  Yes Thurnell Lose, MD  FLUoxetine (PROZAC) 40 MG capsule Take 40 mg by mouth daily.  03/03/16  Yes Historical Provider, MD  furosemide (LASIX) 20 MG tablet Take 1 tablet (20 mg total) by mouth as needed for edema. 06/08/16  Yes Lorretta Harp, MD  metoprolol (LOPRESSOR) 50 MG tablet Take 50 mg by mouth 2 (two) times daily.   Yes Historical Provider, MD  Multiple Vitamins-Minerals (MACULAR VITAMIN BENEFIT) TABS 1 pill by mouth twice a day. There is no dose for multivitamin. Patient taking differently: Take 1 tablet by mouth 2 (two) times daily. Macular Protect 08/03/15  Yes Thurnell Lose,  MD  polyethylene glycol (MIRALAX / GLYCOLAX) packet Take 17 g by mouth every other day. Mix in 8 oz water and drink   Yes Historical Provider, MD  potassium chloride (K-DUR) 10 MEQ tablet Take 1 tablet (10 mEq total) by mouth as needed. Patient taking differently: Take 10 mEq by mouth as needed (takes with lasix only).  06/28/16  Yes Lorretta Harp, MD  sulfamethoxazole-trimethoprim (BACTRIM) 400-80 MG tablet Take 1 tablet by mouth 2 (two) times daily. 08/22/16  Yes Truman Hayward, MD  SYNTHROID 125 MCG tablet TAKE 1 TABLET ONCE DAILY BEFORE BREAKFAST. 07/04/16  Yes Estill Dooms, MD  temazepam (RESTORIL) 30 MG capsule Take 1 capsule (30 mg total) by mouth at bedtime as needed for sleep. 08/03/15  Yes Thurnell Lose, MD   Dg Chest 2 View  Result Date: 01/25/2017 CLINICAL DATA:  Status post fall.  Congestion.  Initial encounter. EXAM: CHEST  2 VIEW COMPARISON:  Chest radiograph performed 03/16/2016 FINDINGS: The lungs are well-aerated. Minimal left basilar atelectasis is noted. There is no evidence of pleural effusion or pneumothorax. The heart is borderline enlarged. No acute osseous abnormalities are seen. Scattered vascular calcifications are seen. IMPRESSION: Borderline cardiomegaly.  Minimal left basilar atelectasis noted. Electronically Signed   By: Garald Balding M.D.   On: 01/25/2017 23:59   Ct Head Wo Contrast  Result Date: 01/25/2017 CLINICAL DATA:  Fall EXAM: CT HEAD WITHOUT CONTRAST CT CERVICAL SPINE WITHOUT CONTRAST TECHNIQUE: Multidetector CT imaging of the head and cervical spine was performed following the standard protocol without intravenous contrast. Multiplanar CT image reconstructions of the cervical spine were also generated. COMPARISON:  None. FINDINGS: CT HEAD FINDINGS Brain: No mass lesion, intraparenchymal hemorrhage or extra-axial collection. No evidence of acute cortical infarct. Brain parenchyma and CSF-containing spaces are normal for age. Vascular: No hyperdense vessel or unexpected calcification. Skull: Normal visualized skull base, calvarium and extracranial soft tissues. Sinuses/Orbits: No sinus fluid levels or advanced mucosal thickening. No mastoid effusion. Normal orbits. CT CERVICAL SPINE FINDINGS Alignment: Grade 1 anterolisthesis at C2-C3. Facets are aligned. Occipital condyles are normally positioned. Skull base and vertebrae: No acute fracture. Soft tissues and spinal canal: No prevertebral fluid or swelling. No visible canal hematoma. Disc levels: Multilevel severe disc space loss with osteophyte formation and facet hypertrophy. Severe left C5-6 foraminal stenosis. No bony spinal canal stenosis. Upper chest: No pneumothorax, pulmonary nodule or pleural effusion. Other: Normal visualized paraspinal cervical soft tissues. IMPRESSION: 1. No acute intracranial abnormality. 2. No acute fracture of the cervical spine. 3. Marked cervical degenerative disc disease and facet hypertrophy. Severe left C5-6 foraminal stenosis. Electronically Signed   By: Ulyses Jarred M.D.   On: 01/25/2017 22:31   Ct Cervical Spine Wo Contrast  Result Date: 01/25/2017 CLINICAL DATA:  Fall EXAM: CT HEAD WITHOUT CONTRAST CT CERVICAL SPINE WITHOUT CONTRAST TECHNIQUE: Multidetector CT imaging of the head and cervical spine was performed following the  standard protocol without intravenous contrast. Multiplanar CT image reconstructions of the cervical spine were also generated. COMPARISON:  None. FINDINGS: CT HEAD FINDINGS Brain: No mass lesion, intraparenchymal hemorrhage or extra-axial collection. No evidence of acute cortical infarct. Brain parenchyma and CSF-containing spaces are normal for age. Vascular: No hyperdense vessel or unexpected calcification. Skull: Normal visualized skull base, calvarium and extracranial soft tissues. Sinuses/Orbits: No sinus fluid levels or advanced mucosal thickening. No mastoid effusion. Normal orbits. CT CERVICAL SPINE FINDINGS Alignment: Grade 1 anterolisthesis at C2-C3. Facets are aligned. Occipital condyles are normally  positioned. Skull base and vertebrae: No acute fracture. Soft tissues and spinal canal: No prevertebral fluid or swelling. No visible canal hematoma. Disc levels: Multilevel severe disc space loss with osteophyte formation and facet hypertrophy. Severe left C5-6 foraminal stenosis. No bony spinal canal stenosis. Upper chest: No pneumothorax, pulmonary nodule or pleural effusion. Other: Normal visualized paraspinal cervical soft tissues. IMPRESSION: 1. No acute intracranial abnormality. 2. No acute fracture of the cervical spine. 3. Marked cervical degenerative disc disease and facet hypertrophy. Severe left C5-6 foraminal stenosis. Electronically Signed   By: Ulyses Jarred M.D.   On: 01/25/2017 22:31   Dg Hip Unilat W Or Wo Pelvis 2-3 Views Left  Result Date: 01/25/2017 CLINICAL DATA:  Status post fall, with generalized left hip pain. Initial encounter. EXAM: DG HIP (WITH OR WITHOUT PELVIS) 2-3V LEFT COMPARISON:  None. FINDINGS: There is a comminuted left femoral intertrochanteric fracture, with shortening, and displacement of a lesser trochanteric fragment. The patient's right hip arthroplasty is grossly unremarkable in appearance, though incompletely characterized, without evidence of loosening. The  left femoral head remains seated at the acetabulum, with minimal superior joint space narrowing noted. The sacroiliac joints are unremarkable in appearance. The visualized bowel gas pattern is grossly unremarkable in appearance. IMPRESSION: Comminuted left femoral intertrochanteric fracture, with shortening, and displacement of a lesser trochanteric fragment. Electronically Signed   By: Garald Balding M.D.   On: 01/25/2017 22:11    All pertinent xrays, MRI, CT independently reviewed and interpreted  Positive ROS: All other systems have been reviewed and were otherwise negative with the exception of those mentioned in the HPI and as above.  Physical Exam: General: Alert, no acute distress Cardiovascular: No pedal edema Respiratory: No cyanosis, no use of accessory musculature GI: No organomegaly, abdomen is soft and non-tender Skin: No lesions in the area of chief complaint Neurologic: Sensation intact distally Psychiatric: Patient is competent for consent with normal mood and affect Lymphatic: No axillary or cervical lymphadenopathy  MUSCULOSKELETAL:  - pain with movement of the hip and extremity - skin intact - NVI distally - compartments soft  Assessment: Left intertroch hip fracture  Plan: - surgery is recommended, patient and family are aware of r/b/a and wish to proceed - consent obtained - medical optimization per primary team - surgery is planned for this afternoon - Based on history and fracture pattern this likely represents a fragility fracture. - Fragility fractures affect up to one half of women and one third of men after age 65 years and occur in the setting of bone disorder such as osteoporosis or osteopenia and warrant appropriate work-up. - The following are general recommendations that may serve as an outline for an appropriate work-up:  1.) Obtain bone density measurement to confirm presumptive diagnosis, assess severity of osteoporosis and risk of future  fracture, and use as baseline for monitoring treatment  2.) Obtain laboratory tests: CBC, ESR, serum calcium, creatinine, albumin,phosphate, alkaline phosphatase, liver transaminases, protein electrophoresis, urinalysis, 25-hydroxyvitamin D.  3.) Exclude secondary causes of low bone mass and skeletal fragility (eg,multiple myeloma, lymphoma) as indicated.  4.) Obtain radiograph of thoracic and lumbar spine, particularly among individuals with back pain or height loss to assess presence of vertebral fractures  5.) Intermittent administration of recombinant human parathyroid hormone  6.) Optimize nutritional status using nutritional supplementation.  7.) Patient/family education to prevent future falls.  8.) Early mobilization and exercise program - exercise decreases the rate of bone loss and has been associated with decreased rate of fragility fractures  Thank you for the consult and the opportunity to see Leah Holland. Eduard Roux, MD [place] 12:17 PM

## 2017-01-26 NOTE — Progress Notes (Signed)
Request SCD's from [employer].

## 2017-01-27 ENCOUNTER — Encounter (HOSPITAL_COMMUNITY): Payer: Self-pay | Admitting: Orthopaedic Surgery

## 2017-01-27 LAB — CBC
HEMATOCRIT: 23.3 % — AB (ref 36.0–46.0)
HEMATOCRIT: 22 % — AB (ref 36.0–46.0)
HEMATOCRIT: 22.1 % — AB (ref 36.0–46.0)
HEMOGLOBIN: 7.2 g/dL — AB (ref 12.0–15.0)
HEMOGLOBIN: 7.3 g/dL — AB (ref 12.0–15.0)
Hemoglobin: 7.6 g/dL — ABNORMAL LOW (ref 12.0–15.0)
MCH: 30.9 pg (ref 26.0–34.0)
MCH: 30.9 pg (ref 26.0–34.0)
MCH: 31.2 pg (ref 26.0–34.0)
MCHC: 32.6 g/dL (ref 30.0–36.0)
MCHC: 32.6 g/dL (ref 30.0–36.0)
MCHC: 33.2 g/dL (ref 30.0–36.0)
MCV: 94.7 fL (ref 78.0–100.0)
MCV: 94 fL (ref 78.0–100.0)
MCV: 94.8 fL (ref 78.0–100.0)
PLATELETS: 149 10*3/uL — AB (ref 150–400)
Platelets: 132 10*3/uL — ABNORMAL LOW (ref 150–400)
Platelets: 154 10*3/uL (ref 150–400)
RBC: 2.46 MIL/uL — ABNORMAL LOW (ref 3.87–5.11)
RBC: 2.33 MIL/uL — ABNORMAL LOW (ref 3.87–5.11)
RBC: 2.34 MIL/uL — ABNORMAL LOW (ref 3.87–5.11)
RDW: 14.7 % (ref 11.5–15.5)
RDW: 14.5 % (ref 11.5–15.5)
RDW: 14.8 % (ref 11.5–15.5)
WBC: 10.1 10*3/uL (ref 4.0–10.5)
WBC: 11.2 10*3/uL — ABNORMAL HIGH (ref 4.0–10.5)
WBC: 12 10*3/uL — AB (ref 4.0–10.5)

## 2017-01-27 LAB — PROTIME-INR
INR: 1.18
Prothrombin Time: 15 seconds (ref 11.4–15.2)

## 2017-01-27 LAB — BASIC METABOLIC PANEL
ANION GAP: 10 (ref 5–15)
BUN: 20 mg/dL (ref 6–20)
CALCIUM: 8.6 mg/dL — AB (ref 8.9–10.3)
CO2: 26 mmol/L (ref 22–32)
CREATININE: 1.11 mg/dL — AB (ref 0.44–1.00)
Chloride: 98 mmol/L — ABNORMAL LOW (ref 101–111)
GFR calc Af Amer: 48 mL/min — ABNORMAL LOW (ref >60)
GFR, EST NON AFRICAN AMERICAN: 42 mL/min — AB (ref >60)
GLUCOSE: 122 mg/dL — AB (ref 65–99)
Potassium: 3.7 mmol/L (ref 3.5–5.1)
Sodium: 134 mmol/L — ABNORMAL LOW (ref 135–145)

## 2017-01-27 LAB — PREPARE RBC (CROSSMATCH)

## 2017-01-27 LAB — MAGNESIUM: Magnesium: 1.3 mg/dL — ABNORMAL LOW (ref 1.7–2.4)

## 2017-01-27 MED ORDER — SODIUM CHLORIDE 0.9 % IV SOLN
Freq: Once | INTRAVENOUS | Status: AC
  Administered 2017-01-27: 17:00:00 via INTRAVENOUS

## 2017-01-27 MED ORDER — FOLIC ACID 1 MG PO TABS
1.0000 mg | ORAL_TABLET | Freq: Every day | ORAL | Status: DC
  Administered 2017-01-27 – 2017-01-30 (×4): 1 mg via ORAL
  Filled 2017-01-27 (×4): qty 1

## 2017-01-27 MED ORDER — FERROUS SULFATE 325 (65 FE) MG PO TABS
325.0000 mg | ORAL_TABLET | Freq: Three times a day (TID) | ORAL | Status: DC
  Administered 2017-01-27 – 2017-01-30 (×8): 325 mg via ORAL
  Filled 2017-01-27 (×8): qty 1

## 2017-01-27 MED ORDER — MAGNESIUM SULFATE 2 GM/50ML IV SOLN
2.0000 g | Freq: Once | INTRAVENOUS | Status: AC
Start: 2017-01-27 — End: 2017-01-27
  Administered 2017-01-27: 2 g via INTRAVENOUS
  Filled 2017-01-27: qty 50

## 2017-01-27 NOTE — Discharge Instructions (Signed)
° ° °  1. Change dressings as needed 2. May shower but keep incisions covered and dry 3. Take lovenox to prevent blood clots 4. Take stool softeners as needed 5. Take pain meds as needed  Information on my medicine - ELIQUIS (apixaban)  This medication education was reviewed with me or my healthcare representative as part of my discharge preparation.   Why was Eliquis prescribed for you? Eliquis was prescribed for you to reduce the risk of a blood clot forming that can cause a stroke if you have a medical condition called atrial fibrillation (a type of irregular heartbeat).  What do You need to know about Eliquis ? Take your Eliquis TWICE DAILY - one tablet in the morning and one tablet in the evening with or without food. If you have difficulty swallowing the tablet whole please discuss with your pharmacist how to take the medication safely.  Take Eliquis exactly as prescribed by your doctor and DO NOT stop taking Eliquis without talking to the doctor who prescribed the medication.  Stopping may increase your risk of developing a stroke.  Refill your prescription before you run out.  After discharge, you should have regular check-up appointments with your healthcare provider that is prescribing your Eliquis.  In the future your dose may need to be changed if your kidney function or weight changes by a significant amount or as you get older.  What do you do if you miss a dose? If you miss a dose, take it as soon as you remember on the same day and resume taking twice daily.  Do not take more than one dose of ELIQUIS at the same time to make up a missed dose.  Important Safety Information A possible side effect of Eliquis is bleeding. You should call your healthcare provider right away if you experience any of the following: ? Bleeding from an injury or your nose that does not stop. ? Unusual colored urine (red or dark brown) or unusual colored stools (red or black). ? Unusual  bruising for unknown reasons. ? A serious fall or if you hit your head (even if there is no bleeding).  Some medicines may interact with Eliquis and might increase your risk of bleeding or clotting while on Eliquis. To help avoid this, consult your healthcare provider or pharmacist prior to using any new prescription or non-prescription medications, including herbals, vitamins, non-steroidal anti-inflammatory drugs (NSAIDs) and supplements.  This website has more information on Eliquis (apixaban): http://www.eliquis.com/eliquis/home

## 2017-01-27 NOTE — Progress Notes (Signed)
[facility] Progress Note  Patient: Leah Holland [id number]   PCP: Jeanmarie Hubert, MD DOB: [birth year]   DOA: 01/25/2017   DOS: 01/27/2017   Date of Service: the patient was seen and examined on 01/27/2017   Subjective: Feeling better, pain has been well controlled. No nausea no vomiting.  Brief hospital course: Pt. with PMH of HTN, HLD, hypothyroidism, A. fib on Eliquis; admitted on 01/25/2017, with complaint of fall mechanical, was found to have left hip intertrochanteric fracture. S/P intramedullary implant 01/26/2017. Patient tolerated the procedure well. Currently further plan is postoperative recovery.  Assessment and Plan: 1. Closed comminuted fracture of left hip (HCC) S/P intramedullary implant 01/26/2017 Postoperative loss anemia. Hemoglobin has dropped from 11-7.6. Recheck hemoglobin at 4 PM. Transfuse for hemoglobin less than 7. Starting the patient on iron supplementation. We'll monitor.  2.Leukocytosis: WBC elevated. Patient denies having any infectious symptoms. Likely stress response.  3. Atrial fibrillation on chronic anticoagulation therapy  - Continue diltiazem, metoprolol - Eliquis resumed   4. Essential hypertension - Stop benazepril, chlorthalidone.  5. S/p right hip fracture: Previously right hip repair performed by Dr. [name] in 03/2016. 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 - Continue Bactrim    6. Hypothyroidism - Continue Synthroid  7. GERD - Continued pharmacy substitution of Protonix for Nexium  8. Osteoporosis: Patient noted to be on Prolia  9. Hyperlipidemia - Continue atorvastatin   10 Hyponatremia: Chronic. Sodium 133 - Continue to monitor  11. Diabetes mellitus type 2 diet controlled - Continue to monitor  12. Insomnia - Continue Restoril  Bowel regimen: last BM 01/25/2017 Diet: cardiac diet DVT Prophylaxis: on therapeutic  anticoagulation.  Advance goals of care discussion: full code  Family Communication: family was present at bedside, at the time of interview. The pt provided permission to discuss medical plan with the family. Opportunity was given to ask question and all questions were answered satisfactorily.   Disposition:  Discharge to SNF Expected discharge date: 02/17-18/2018. Depending on stabilization of H&H  Consultants: Orthopedics Procedures: S/P intramedullary implant 01/26/2017  Antibiotics: Anti-infectives    Start     Dose/Rate Route Frequency Ordered Stop   01/26/17 2200  ceFAZolin (ANCEF) IVPB 2g/100 mL premix     2 g 200 mL/hr over 30 Minutes Intravenous Every 6 hours 01/26/17 1812 01/27/17 1154   01/26/17 1000  sulfamethoxazole-trimethoprim (BACTRIM,SEPTRA) 400-80 MG per tablet 1 tablet     1 tablet Oral 2 times daily 01/26/17 0256          Objective: Physical Exam: Vitals:   01/26/17 1954 01/26/17 2159 01/27/17 0653 01/27/17 1500  BP: (!) 116/48 (!) 120/49 (!) 110/37 (!) 111/37  Pulse: 64 68 66 68  Resp: 16  16 16   Temp: 98.4 F (36.9 C)  99 F (37.2 C) 98.2 F (36.8 C)  TempSrc: Oral  Oral Oral  SpO2: 96%  96% 97%    Intake/Output Summary (Last 24 hours) at 01/27/17 1539 Last data filed at 01/27/17 1500  Gross per 24 hour  Intake             1430 ml  Output              800 ml  Net              630 ml   There were no vitals filed for this visit.  General: Alert, Awake and Oriented to Time, Place and Person.  Appear in mild distress, affect appropriate Eyes: PERRL, Conjunctiva normal ENT: Oral Mucosa clear moist. Neck: no JVD, no Abnormal Mass Or lumps Cardiovascular: S1 and S2 Present, aortic systolic Murmur, Respiratory: Bilateral Air entry equal and Decreased, no use of accessory muscle, Clear to Auscultation, no Crackles, no wheezes Abdomen: Bowel Sound present, Soft and no tenderness Skin: no redness, no Rash, no induration Extremities: no Pedal edema,  no calf tenderness Neurologic: Grossly no focal neuro deficit. Bilaterally Equal motor strength  Data Reviewed: CBC:  Recent Labs Lab 01/25/17 2130 01/26/17 0257 01/27/17 0724  WBC 15.3* 12.8* 10.1  NEUTROABS 12.9*  --   --   HGB 12.3 11.1* 7.6*  HCT 36.7 33.9* 23.3*  MCV 92.7 92.6 94.7  PLT 229 211 [id number]*   Basic Metabolic Panel:  Recent Labs Lab 01/25/17 2130 01/26/17 0257 01/27/17 0724  NA 133* 134* 134*  K 3.6 3.9 3.7  CL 97* 99* 98*  CO2 24 26 26   GLUCOSE 179* 186* 122*  BUN 18 18 20   CREATININE 1.10* 1.00 1.11*  CALCIUM 9.9 9.6 8.6*  MG  --   --  1.3*    Liver Function Tests: No results for input(s): AST, ALT, ALKPHOS, BILITOT, PROT, ALBUMIN in the last 168 hours. No results for input(s): LIPASE, AMYLASE in the last 168 hours. No results for input(s): AMMONIA in the last 168 hours. Coagulation Profile:  Recent Labs Lab 01/26/17 0102 01/27/17 0724  INR 1.06 1.18   Cardiac Enzymes: No results for input(s): CKTOTAL, CKMB, CKMBINDEX, TROPONINI in the last 168 hours. BNP (last 3 results) No results for input(s): PROBNP in the last 8760 hours.  CBG:  Recent Labs Lab 01/26/17 1715  GLUCAP 124*    Studies: Dg C-arm 1-60 Min  Result Date: 01/26/2017 CLINICAL DATA:  ORIF left proximal femur fracture EXAM: LEFT FEMUR 2 VIEWS; DG C-ARM 61-120 MIN COMPARISON:  01/25/2017 left hip radiograph FINDINGS: Fluoroscopy time 1 minutes 30 seconds. Multiple nondiagnostic spot fluoroscopic intraoperative radiographs of the left femur demonstrate postsurgical changes from transfixation of the comminuted intertrochanteric proximal left femur fracture by intramedullary rod with interlocking left femoral neck screws and distal interlocking screw. Partially visualized left total knee arthroplasty. IMPRESSION: Intraoperative fluoroscopic guidance for ORIF left proximal femur fracture. Electronically Signed   By: Ilona Sorrel M.D.   On: 01/26/2017 16:45   Dg Femur Min 2 Views  Left  Result Date: 01/26/2017 CLINICAL DATA:  ORIF left proximal femur fracture EXAM: LEFT FEMUR 2 VIEWS; DG C-ARM 61-120 MIN COMPARISON:  01/25/2017 left hip radiograph FINDINGS: Fluoroscopy time 1 minutes 30 seconds. Multiple nondiagnostic spot fluoroscopic intraoperative radiographs of the left femur demonstrate postsurgical changes from transfixation of the comminuted intertrochanteric proximal left femur fracture by intramedullary rod with interlocking left femoral neck screws and distal interlocking screw. Partially visualized left total knee arthroplasty. IMPRESSION: Intraoperative fluoroscopic guidance for ORIF left proximal femur fracture. Electronically Signed   By: Ilona Sorrel M.D.   On: 01/26/2017 16:45     Scheduled Meds: . apixaban  2.5 mg Oral BID  . aspirin EC  81 mg Oral Daily  . atorvastatin  80 mg Oral q1800  . calcium carbonate  1 tablet Oral QPM  . diltiazem  180 mg Oral Daily  . FLUoxetine  40 mg Oral Daily  . levothyroxine  125 mcg Oral QAC breakfast  . metoprolol  50 mg Oral BID  . multivitamin  1 tablet Oral BID  . pantoprazole  40 mg Oral BID  .  polyethylene glycol  17 g Oral QODAY  . sulfamethoxazole-trimethoprim  1 tablet Oral BID   Continuous Infusions: PRN Meds: acetaminophen **OR** acetaminophen, alum & mag hydroxide-simeth, fentaNYL (SUBLIMAZE) injection, HYDROcodone-acetaminophen, HYDROcodone-acetaminophen, menthol-cetylpyridinium **OR** phenol, methocarbamol **OR** methocarbamol (ROBAXIN)  IV, metoCLOPramide **OR** metoCLOPramide (REGLAN) injection, ondansetron **OR** ondansetron (ZOFRAN) IV, temazepam  Time spent: 30 minutes  Author: Berle Mull, MD [facility] Pager: [phone] 01/27/2017 3:39 PM  If 7PM-7AM, please contact night-coverage at [email or web address], password [facility]

## 2017-01-27 NOTE — NC FL2 (Signed)
[place] LEVEL OF CARE SCREENING TOOL     IDENTIFICATION  Patient Name: Leah Holland Birthdate: Feb 02, 1922 Sex: female Admission Date (Current Location): 01/25/2017  [facility] and [place] Number:  Herbalist and Address:  [facility] [place] [place] [address], [place], [place] [address]      Provider Number: [id number]  Attending Physician Name and Address:  Lavina Hamman, MD  Relative Name and Phone Number:       Current Level of Care: Hospital Recommended Level of Care: [place] Prior Approval Number:    Date Approved/Denied:   PASRR Number: [id number]  Discharge Plan: SNF    Current Diagnoses: Patient Active Problem List   Diagnosis Date Noted  . Closed comminuted fracture of left hip ([place]) 01/26/2017  . Leukocytosis 01/26/2017  . Fall 01/26/2017  . Serratia infection 11/14/2016  . Hardware complicating wound infection ([place]) 08/22/2016  . Prosthetic hip infection ([place]) 08/22/2016  . Proteus infection 08/22/2016  . Tendinopathy 08/22/2016  . Clinical depression 04/07/2016  . Arthritis of knee, degenerative 04/07/2016  . OP (osteoporosis) 04/07/2016  . 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 Periprosthetic fracture around internal prosthetic right hip joint ([place]), nonunion basicervical femoral neck with broken hardware and subtrochanteric fracture 03/16/2016  . Hip fx ([place]) 03/16/2016  . Preoperative clearance 03/08/2016  . Chronic anticoagulation 08/06/2015  . Bipolar disorder ([place]) 08/04/2015  . Hyponatremia 08/04/2015  . Constipation 08/04/2015  . Fracture, intertrochanteric, right femur ([place])   . S/P ORIF (open reduction internal fixation) fracture   . Complication of anesthesia 07/30/2015  . Diabetes mellitus type 2, diet-controlled ([place])   . Skin cancer of face   . Paroxysmal atrial fibrillation ([place]) 09/01/2014  . Right carotid bruit 11/18/2013  . Postoperative anemia due to acute blood  loss 03/03/2012  . Hypertension   . Hyperlipidemia   . Swelling of extremity   . Cataract   . Hypothyroidism   . Arthritis   . Insomnia     Orientation RESPIRATION BLADDER Height & Weight     Self, Situation, Place, Time  O2 (Nasal Cannula, 2L) Continent Weight:   Height:     BEHAVIORAL SYMPTOMS/MOOD NEUROLOGICAL BOWEL NUTRITION STATUS      Continent  (Please see d/c summary)  AMBULATORY STATUS COMMUNICATION OF NEEDS Skin   Extensive Assist Verbally Surgical wounds (Closed incision left thigh. Left elbow abraison, foam dressing.)                       Personal Care Assistance Level of Assistance  Bathing, Dressing, Feeding Bathing Assistance: Maximum assistance Feeding assistance: Limited assistance Dressing Assistance: Maximum assistance     Functional Limitations Info  Sight, Hearing, Speech Sight Info: Adequate Hearing Info: Adequate Speech Info: Adequate    SPECIAL CARE FACTORS FREQUENCY  PT (By licensed PT), OT (By licensed OT)     PT Frequency: 3x week OT Frequency: 3x week            Contractures Contractures Info: Not present    Additional Factors Info  Code Status, Allergies Code Status Info: Full Code Allergies Info: Levaquin Levofloxacin, Morphine And Related           Current Medications (01/27/2017):  This is the current hospital active medication list Current Facility-Administered Medications  Medication Dose Route Frequency Provider Last Rate Last Dose  . 0.9 %  sodium chloride infusion   Intravenous Once Lavina Hamman, MD      .  acetaminophen (TYLENOL) tablet 650 mg  650 mg Oral Q6H PRN Naiping Ephriam Jenkins, MD       Or  . acetaminophen (TYLENOL) suppository 650 mg  650 mg Rectal Q6H PRN Naiping Ephriam Jenkins, MD      . alum & mag hydroxide-simeth (MAALOX/MYLANTA) 200-200-20 MG/5ML suspension 30 mL  30 mL Oral Q4H PRN Naiping Ephriam Jenkins, MD      . apixaban Arne [place]) tablet 2.5 mg  2.5 mg Oral BID Leandrew Koyanagi, MD   2.5 mg at 01/27/17 1126  . aspirin EC  tablet 81 mg  81 mg Oral Daily Rondell Charmayne Sheer, MD   81 mg at 01/27/17 1124  . atorvastatin (LIPITOR) tablet 80 mg  80 mg Oral q1800 Leandrew Koyanagi, MD   80 mg at 01/26/17 2030  . calcium carbonate (OS-CAL - dosed in mg of elemental calcium) tablet 500 mg of elemental calcium  1 tablet Oral QPM Rondell A Tamala Julian, MD   500 mg of elemental calcium at 01/26/17 1842  . diltiazem (CARDIZEM CD) 24 hr capsule 180 mg  180 mg Oral Daily Norval Morton, MD   180 mg at 01/27/17 1125  . fentaNYL (SUBLIMAZE) injection 50 mcg  50 mcg Intravenous Q2H PRN Norval Morton, MD      . FLUoxetine (PROZAC) capsule 40 mg  40 mg Oral Daily Rondell Charmayne Sheer, MD   40 mg at 01/27/17 1125  . HYDROcodone-acetaminophen (NORCO/VICODIN) 5-325 MG per tablet 1-2 tablet  1-2 tablet Oral Q6H PRN Norval Morton, MD   2 tablet at 01/27/17 0831  . HYDROcodone-acetaminophen (NORCO/VICODIN) 5-325 MG per tablet 1-2 tablet  1-2 tablet Oral Q6H PRN Leandrew Koyanagi, MD      . levothyroxine (SYNTHROID, LEVOTHROID) tablet 125 mcg  125 mcg Oral QAC breakfast Norval Morton, MD   125 mcg at 01/27/17 [phone]  . menthol-cetylpyridinium (CEPACOL) lozenge 3 mg  1 lozenge Oral PRN Leandrew Koyanagi, MD   3 mg at 01/26/17 2310   Or  . phenol (CHLORASEPTIC) mouth spray 1 spray  1 spray Mouth/Throat PRN Naiping Ephriam Jenkins, MD      . methocarbamol (ROBAXIN) tablet 500 mg  500 mg Oral Q6H PRN Naiping Ephriam Jenkins, MD       Or  . methocarbamol (ROBAXIN) 500 mg in dextrose 5 % 50 mL IVPB  500 mg Intravenous Q6H PRN Naiping Ephriam Jenkins, MD      . metoCLOPramide (REGLAN) tablet 5-10 mg  5-10 mg Oral Q8H PRN Naiping Ephriam Jenkins, MD       Or  . metoCLOPramide (REGLAN) injection 5-10 mg  5-10 mg Intravenous Q8H PRN Naiping Ephriam Jenkins, MD      . metoprolol (LOPRESSOR) tablet 50 mg  50 mg Oral BID Norval Morton, MD   50 mg at 01/27/17 1000  . multivitamin (PROSIGHT) tablet 1 tablet  1 tablet Oral BID Norval Morton, MD   1 tablet at 01/27/17 1125  . ondansetron (ZOFRAN) tablet 4 mg  4 mg Oral Q6H PRN  Naiping Ephriam Jenkins, MD       Or  . ondansetron [facility]) injection 4 mg  4 mg Intravenous Q6H PRN Naiping Ephriam Jenkins, MD      . pantoprazole (PROTONIX) EC tablet 40 mg  40 mg Oral BID Norval Morton, MD   40 mg at 01/27/17 1125  . polyethylene glycol (MIRALAX / GLYCOLAX) packet 17 g  17 g Oral QODAY Norval Morton, MD  Stopped at 01/26/17 1000  . sulfamethoxazole-trimethoprim (BACTRIM,SEPTRA) 400-80 MG per tablet 1 tablet  1 tablet Oral BID Norval Morton, MD   1 tablet at 01/27/17 1125  . temazepam (RESTORIL) capsule 30 mg  30 mg Oral QHS PRN Norval Morton, MD   30 mg at 01/26/17 2207     Discharge Medications: Please see discharge summary for a list of discharge medications.  Relevant Imaging Results:  Relevant Lab Results:   Additional Information SSN: [id number]  Alla German, LCSW

## 2017-01-27 NOTE — Progress Notes (Addendum)
Patient has not yet voided since catheter removal. Patient stated she had very minimal urge to void. RN bladder scanned patient multiple times and bladder scan showed only 75 mL.  1400 -RN notified Dr Posey Pronto that patient has not voided yet and he stated to in and out cath patient if urine on bladder scan was 300 mL or greater.

## 2017-01-27 NOTE — Progress Notes (Signed)
Patient successfully voided into bedpan.   RN notified Dr Posey Pronto due to patient's temperature prior to blood transfusion being 99.2, and at 15 minute check while transfusing blood, patient's temperature at 99.6. Patient voices no complaints, denies feeling hot. Dr Posey Pronto stated no further action required at this time. Blood transfusion continuing.

## 2017-01-27 NOTE — Evaluation (Signed)
Physical Therapy Evaluation Patient Details Name: Leah Holland MRN: [id number] DOB: 1922/01/11 Today's Date: 01/27/2017   History of Present Illness  Pt is a 83 y/o female s/p Treatment of left intertrochanteric fracture with intramedullary implant. 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 Pt has a past surgical history that includes Total knee arthroplasty (bilateral); Cataract extraction, bilateral (Bilateral, 2000); Femur IM nail (Right, 08/01/2015); and Total hip arthroplasty (Right, 03/16/2016).  Clinical Impression  Pt is POD 1 following the above procedure. Prior to admission, pt was independent and living in an ILF receiving occasional assistance and driving herself short distances. Pt will require further acute PT services to address the below deficits in order to assist with transition to next venue of care. Pt will require SNF placement upon DC in order to maximize her functional outcomes.     Follow Up Recommendations SNF;Supervision/Assistance - 24 hour    Equipment Recommendations  None recommended by PT    Recommendations for Other Services       Precautions / Restrictions Precautions Precautions: Fall Precaution Comments: current injury resulted from a fall Restrictions Weight Bearing Restrictions: Yes LLE Weight Bearing: Weight bearing as tolerated      Mobility  Bed Mobility Overal bed mobility: Needs Assistance Bed Mobility: Supine to Sit     Supine to sit: HOB elevated;Mod assist     General bed mobility comments: assist for LLE, and assist for trunk elevation  Transfers Overall transfer level: Needs assistance Equipment used: Rolling walker (2 wheeled) Transfers: Sit to/from [facility] Sit to Stand: Mod assist Stand pivot transfers: Mod assist;+2 physical assistance;Max assist       General transfer comment: Mod A on RW from bed to [facility] and Mod A from [facility] to recliner  Ambulation/Gait             General Gait Details: Unable to assess this session  Stairs            Wheelchair Mobility    Modified Rankin (Stroke Patients Only)       Balance Overall balance assessment: Needs assistance Sitting-balance support: Bilateral upper extremity supported;Feet supported Sitting balance-Leahy Scale: Fair Sitting balance - Comments: sitting EOB with no back support   Standing balance support: Bilateral upper extremity supported;During functional activity Standing balance-Leahy Scale: Poor                               Pertinent Vitals/Pain Pain Assessment: 0-10 Pain Score: 3  Pain Location: LLE Pain Descriptors / Indicators: Grimacing;Guarding;Sore Pain Intervention(s): Limited activity within patient's tolerance;Monitored during session;Repositioned;Premedicated before session;Ice applied    Home Living Family/patient expects to be discharged to:: Skilled nursing facility (at [facility])                 Additional Comments: Pt resides in an independent living at [facility]. Plans to DC to SNF at [facility]    Prior Function Level of Independence: Needs assistance   Gait / Transfers Assistance Needed: uses rollator for gait, cane to dining hall   ADL's / Homemaking Assistance Needed: aide 2 hrs a day  assist with household tasks, meal prep, supervision for showering  Comments: Pt still driving     Hand Dominance   Dominant Hand: Right    Extremity/Trunk Assessment   Upper Extremity  Assessment Upper Extremity Assessment: Defer to OT evaluation    Lower Extremity Assessment Lower Extremity Assessment: LLE deficits/detail LLE Deficits / Details: Pt with normal post op pain and weakness. At least 3/5 ankle and 2/5 knee and hip per gross functional assessment    Cervical / Trunk Assessment Cervical / Trunk Assessment: Kyphotic  Communication   Communication: HOH  Cognition Arousal/Alertness: Awake/alert Behavior During Therapy: WFL for tasks assessed/performed Overall Cognitive Status: Within Functional Limits for tasks assessed                      General Comments      Exercises     Assessment/Plan    PT Assessment Patient needs continued PT services  PT Problem List Decreased strength;Decreased range of motion;Decreased activity tolerance;Decreased balance;Decreased mobility;Decreased knowledge of use of DME;Pain          PT Treatment Interventions DME instruction;Gait training;Functional mobility training;Therapeutic activities;Therapeutic exercise;Balance training;Patient/family education    PT Goals (Current goals can be found in the Care Plan section)  Acute Rehab PT Goals Patient Stated Goal: to be independent again PT Goal Formulation: With patient Time For Goal Achievement: 02/03/17 Potential to Achieve Goals: Fair    Frequency Min 3X/week   Barriers to discharge        Co-evaluation PT/OT/SLP Co-Evaluation/Treatment: Yes Reason for Co-Treatment: For patient/therapist safety;To address functional/ADL transfers PT goals addressed during session: Mobility/safety with mobility OT goals addressed during session: ADL's and self-care       End of Session Equipment Utilized During Treatment: Gait belt Activity Tolerance: Patient limited by pain;Patient limited by fatigue Patient left: in chair;with call bell/phone within reach Nurse Communication: Mobility status;Other (comment) (lower BPs)         Time: [id number] PT Time  Calculation (min) (ACUTE ONLY): 44 min   Charges:   PT Evaluation $PT Eval Moderate Complexity: 1 Procedure     PT G Codes:        Scheryl Marten PT, DPT  [phone]  01/27/2017, 3:58 PM

## 2017-01-27 NOTE — Evaluation (Addendum)
Occupational Therapy Evaluation Patient Details Name: Leah Holland MRN: [id number] DOB: 15-Feb-1922 Today's Date: 01/27/2017    History of Present Illness Pt is a 83 y/o female s/p Treatment of left intertrochanteric fracture with intramedullary implant. 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 Pt has a past surgical history that includes Total knee arthroplasty (bilateral); Cataract extraction, bilateral (Bilateral, 2000); Femur IM nail (Right, 08/01/2015); and Total hip arthroplasty (Right, 03/16/2016).   Clinical Impression   PTA Pt living in independent living and got min A for ADL and used a rollator or SPC when walking outside her home, still driving. Pt currently max A for LB ADL and Max A for stand pivot transfer with RW. Pt will benefit from skilled OT in the acute care setting to maximize safety and independence in ADL and functional transfers. Pt will require SNF level therapy to return to PLOF. Next session to focus on functional transfers and education for LB dressing (check to see if Pt familiar as she has has several hip surgeries).   Pt had drop in blood pressure during session: 100/37 (58) on BSC 122/36 (57) sitting in recliner with feet up 119/36 (57) in recliner, reclined, feet up 114/35 (53) in recliner, reclined, feet up - O2 reapplied (dropped to 80) gingerale provided  Pt non-symptomatic - RN and NT notified     Follow Up Recommendations  SNF;Supervision/Assistance - 24 hour (initially)     Equipment Recommendations  Other (comment) (defer to next venue of care)    Recommendations for Other Services       Precautions / Restrictions Precautions Precautions: Fall Restrictions Weight Bearing Restrictions: Yes LLE Weight Bearing: Weight bearing as tolerated      Mobility Bed Mobility Overal bed mobility: Needs Assistance Bed Mobility: Supine to Sit     Supine to sit: HOB elevated;Mod assist     General bed mobility comments: assist for LLE, and assist for trunk elevation  Transfers Overall transfer level: Needs assistance Equipment used: Rolling walker (2 wheeled) Transfers: Sit to/from [facility] Sit to Stand: Mod assist Stand pivot transfers: Mod assist;+2 physical assistance;Max assist (Pt mod A from bed to BSC, max A (no RW) from [facility] to recliner)            Balance Overall balance assessment: Needs assistance Sitting-balance support: Bilateral upper extremity supported;Feet supported Sitting balance-Leahy Scale: Fair Sitting balance - Comments: sitting EOB with no back support   Standing balance support: Bilateral upper extremity supported;During functional activity Standing balance-Leahy Scale: Poor                              ADL Overall ADL's : Needs assistance/impaired Eating/Feeding: Modified independent;Sitting   Grooming: Set up;Sitting   Upper Body Bathing: Set up;Sitting   Lower Body Bathing: Moderate assistance;Sitting/lateral leans   Upper Body Dressing : Set up;Sitting   Lower Body Dressing: Maximal assistance;Sit to/from stand   Toilet Transfer: Moderate assistance;Stand-pivot;BSC;RW Toilet Transfer Details (indicate cue type and reason): vc for safe hand placement Toileting- Clothing Manipulation and Hygiene: Maximal assistance;Sit to/from stand Toileting - Clothing Manipulation Details (indicate cue type and reason):  hospital gown     Functional mobility during ADLs:  (stand pivot x2 this  session) General ADL Comments: Blood pressure limiting session     Vision Vision Assessment?: No apparent visual deficits   Perception     Praxis      Pertinent Vitals/Pain Pain Assessment: 0-10 Pain Score: 3  Pain Location: LLE Pain Descriptors / Indicators: Grimacing;Guarding;Sore Pain Intervention(s): Monitored during session;Repositioned;Premedicated before session;Ice applied     Hand Dominance Right   Extremity/Trunk Assessment Upper Extremity Assessment Upper Extremity Assessment: Generalized weakness   Lower Extremity Assessment Lower Extremity Assessment: LLE deficits/detail LLE Deficits / Details: post op deficits in strength and ROM   Cervical / Trunk Assessment Cervical / Trunk Assessment: Kyphotic   Communication Communication Communication: HOH   Cognition Arousal/Alertness: Awake/alert Behavior During Therapy: WFL for tasks assessed/performed Overall Cognitive Status: Within Functional Limits for tasks assessed                     General Comments       Exercises       Shoulder Instructions      Home Living Family/patient expects to be discharged to:: Other (Comment) ([facility])                                 Additional Comments: Pt resides at independent living at [facility] and plans on D/C to [place]      Prior Functioning/Environment Level of Independence: Needs assistance  Gait / Transfers Assistance Needed: uses rollator for gait, cane to dining hall  ADL's / [employer] Assistance Needed: aide 2 hrs a day assist with household tasks, meal prep, supervision for showering   Comments: Pt still driving        OT Problem List: Decreased strength;Decreased range of motion;Decreased activity tolerance;Impaired balance (sitting and/or standing);Decreased knowledge of use of DME or AE;Decreased safety awareness;Pain   OT Treatment/Interventions: Self-care/ADL training;DME and/or AE  instruction;Therapeutic activities;Patient/family education;Balance training    OT Goals(Current goals can be found in the care plan section) Acute Rehab OT Goals Patient Stated Goal: to be independent again OT Goal Formulation: With patient Time For Goal Achievement: 02/03/17 Potential to Achieve Goals: Good ADL Goals Pt Will Perform Lower Body Bathing: with min guard assist;with adaptive equipment;sitting/lateral leans Pt Will Perform Lower Body Dressing: with min assist;with caregiver independent in assisting;with adaptive equipment;sit to/from stand Pt Will Transfer to Toilet: with min guard assist;stand pivot transfer;bedside commode (with caregiver independent in assisting; with RW) Pt Will Perform Toileting - Clothing Manipulation and hygiene: with min guard assist;with caregiver independent in assisting;sit to/from stand  OT Frequency: Min 2X/week   Barriers to D/C:            Co-evaluation PT/OT/SLP Co-Evaluation/Treatment: Yes Reason for Co-Treatment: For patient/therapist safety;To address functional/ADL transfers PT goals addressed during session: Mobility/safety with mobility OT goals addressed during session: ADL's and self-care      End of Session Equipment Utilized During Treatment: Gait belt;Rolling walker;Oxygen Nurse Communication: Mobility status;Other (comment) (BP status)  Activity Tolerance: Other (comment) (Limited by blood pressure dropping) Patient left: in chair;with call bell/phone within reach   Time: 0823-0907 OT Time Calculation (min): 44 min Charges:  OT General Charges $OT Visit: 1 Procedure OT Evaluation $OT Eval Moderate Complexity: 1 Procedure OT Treatments $Self Care/Home Management : 8-22 mins G-Codes:    Merri Ray Brandyn Thien 02/22/17, 3:45 PM  Hulda Humphrey  OTR/L [phone] Addendum:  Added in blood pressure measurements

## 2017-01-27 NOTE — Progress Notes (Signed)
   Subjective:  Patient reports pain as moderate.  No events.  Objective:   VITALS:   Vitals:   01/26/17 1809 01/26/17 1954 01/26/17 2159 01/27/17 0653  BP: (!) 151/50 (!) 116/48 (!) 120/49 (!) 110/37  Pulse: 62 64 68 66  Resp: 16 16  16   Temp: 97.9 F (36.6 C) 98.4 F (36.9 C)  99 F (37.2 C)  TempSrc:  Oral  Oral  SpO2: 97% 96%  96%    Neurologically intact Neurovascular intact Sensation intact distally Intact pulses distally Dorsiflexion/Plantar flexion intact Incision: dressing C/D/I and no drainage No cellulitis present Compartment soft   Lab Results  Component Value Date   WBC 10.1 01/27/2017   HGB 7.6 (L) 01/27/2017   HCT 23.3 (L) 01/27/2017   MCV 94.7 01/27/2017   PLT 149 (L) 01/27/2017     Assessment/Plan:  1 Day Post-Op   - Expected postop acute blood loss anemia - will monitor for symptoms - consider transfusion - Up with PT/OT - DVT ppx - SCDs, ambulation, eliquis - WBAT operative extremity - Pain control  Eduard Roux 01/27/2017, 8:37 AM [phone]

## 2017-01-28 LAB — BASIC METABOLIC PANEL
ANION GAP: 7 (ref 5–15)
BUN: 26 mg/dL — ABNORMAL HIGH (ref 6–20)
CALCIUM: 8.9 mg/dL (ref 8.9–10.3)
CHLORIDE: 98 mmol/L — AB (ref 101–111)
CO2: 28 mmol/L (ref 22–32)
Creatinine, Ser: 1.41 mg/dL — ABNORMAL HIGH (ref 0.44–1.00)
GFR calc non Af Amer: 31 mL/min — ABNORMAL LOW (ref >60)
GFR, EST AFRICAN AMERICAN: 36 mL/min — AB (ref >60)
GLUCOSE: 122 mg/dL — AB (ref 65–99)
Potassium: 3.9 mmol/L (ref 3.5–5.1)
Sodium: 133 mmol/L — ABNORMAL LOW (ref 135–145)

## 2017-01-28 LAB — CBC
HEMATOCRIT: 23.7 % — AB (ref 36.0–46.0)
HEMATOCRIT: 22.9 % — AB (ref 36.0–46.0)
HEMOGLOBIN: 7.8 g/dL — AB (ref 12.0–15.0)
HEMOGLOBIN: 7.7 g/dL — AB (ref 12.0–15.0)
MCH: 30.7 pg (ref 26.0–34.0)
MCH: 31.3 pg (ref 26.0–34.0)
MCHC: 32.9 g/dL (ref 30.0–36.0)
MCHC: 33.6 g/dL (ref 30.0–36.0)
MCV: 93.3 fL (ref 78.0–100.0)
MCV: 93.1 fL (ref 78.0–100.0)
Platelets: 140 10*3/uL — ABNORMAL LOW (ref 150–400)
Platelets: 145 10*3/uL — ABNORMAL LOW (ref 150–400)
RBC: 2.54 MIL/uL — ABNORMAL LOW (ref 3.87–5.11)
RBC: 2.46 MIL/uL — AB (ref 3.87–5.11)
RDW: 14.9 % (ref 11.5–15.5)
RDW: 14.9 % (ref 11.5–15.5)
WBC: 11.4 10*3/uL — AB (ref 4.0–10.5)
WBC: 13.2 10*3/uL — AB (ref 4.0–10.5)

## 2017-01-28 LAB — MAGNESIUM: Magnesium: 1.7 mg/dL (ref 1.7–2.4)

## 2017-01-28 MED ORDER — SODIUM CHLORIDE 0.9 % IV SOLN
INTRAVENOUS | Status: DC
  Administered 2017-01-28 (×2): via INTRAVENOUS

## 2017-01-28 MED ORDER — POLYETHYLENE GLYCOL 3350 17 G PO PACK
17.0000 g | PACK | Freq: Two times a day (BID) | ORAL | Status: DC
  Administered 2017-01-28 – 2017-01-30 (×4): 17 g via ORAL
  Filled 2017-01-28 (×4): qty 1

## 2017-01-28 MED ORDER — SULFAMETHOXAZOLE-TRIMETHOPRIM 400-80 MG PO TABS
1.0000 | ORAL_TABLET | Freq: Every day | ORAL | Status: DC
  Administered 2017-01-29: 1 via ORAL
  Filled 2017-01-28 (×2): qty 1

## 2017-01-28 MED ORDER — SENNOSIDES-DOCUSATE SODIUM 8.6-50 MG PO TABS
1.0000 | ORAL_TABLET | Freq: Two times a day (BID) | ORAL | Status: DC
  Administered 2017-01-28 – 2017-01-30 (×4): 1 via ORAL
  Filled 2017-01-28 (×4): qty 1

## 2017-01-28 NOTE — Progress Notes (Signed)
[facility] Progress Note  Patient: Leah Holland [id number]   PCP: Jeanmarie Hubert, MD DOB: 1922-11-25   DOA: 01/25/2017   DOS: 01/28/2017   Date of Service: the patient was seen and examined on 01/28/2017   Subjective: Feeling better, pain has been well controlled As long as she is not moving. No nausea no vomiting.  Brief hospital course: Pt. with PMH of HTN, HLD, hypothyroidism, A. fib on Eliquis; admitted on 01/25/2017, with complaint of fall mechanical, was found to have left hip intertrochanteric fracture. S/P intramedullary implant 01/26/2017. Patient tolerated the procedure well. Currently further plan is postoperative recovery.  Assessment and Plan: 1. Close left hip intertrochanteric fracture. S/P intramedullary implant 01/26/2017 Postoperative blood loss anemia. Hemoglobin has dropped from 11-7.7. Transfuse for hemoglobin less than 7.  Starting the patient on iron supplementation. We'll monitor.  2.Leukocytosis: WBC elevated. Patient denies having any infectious symptoms. Likely stress response. Continue to monitor  3. Atrial fibrillation on chronic anticoagulation therapy  - Continue diltiazem, metoprolol - Eliquis resumed   4. Essential hypertension Acute kidney injury. Patient has mild worsening of renal function on 01/28/2017, I would hydrate the patient has IV fluids - Stop benazepril, chlorthalidone.  5. S/p right hip fracture: Previously right hip repair performed by Dr. [name] in 03/2016. 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 - Continue Bactrim    6. Hypothyroidism - Continue Synthroid  7. GERD - Continued pharmacy substitution of Protonix for Nexium  8. Osteoporosis:  Patient noted to be on Prolia Patient will need periodic bone density scan as an outpatient.  9. Hyperlipidemia - Continue atorvastatin   10 Hyponatremia: Chronic. Sodium 133 - Continue to  monitor  11. Diabetes mellitus type 2 diet controlled - Continue to monitor  12. Insomnia - Continue Restoril  Bowel regimen: last BM 01/25/2017 continue bowel regimen Diet: cardiac diet DVT Prophylaxis: on therapeutic anticoagulation.  Advance goals of care discussion: full code  Family Communication: family was present at bedside, at the time of interview. The pt provided permission to discuss medical plan with the family. Opportunity was given to ask question and all questions were answered satisfactorily.   Disposition:  Discharge to SNF Expected discharge date: 01/29/2017. Depending on stabilization of H&H and his renal function  Consultants: Orthopedics Procedures: S/P intramedullary implant 01/26/2017  Antibiotics: Anti-infectives    Start     Dose/Rate Route Frequency Ordered Stop   01/29/17 1000  sulfamethoxazole-trimethoprim (BACTRIM,SEPTRA) 400-80 MG per tablet 1 tablet     1 tablet Oral Daily 01/28/17 1345     01/26/17 2200  ceFAZolin (ANCEF) IVPB 2g/100 mL premix     2 g 200 mL/hr over 30 Minutes Intravenous Every 6 hours 01/26/17 1812 01/27/17 1154   01/26/17 1000  sulfamethoxazole-trimethoprim (BACTRIM,SEPTRA) 400-80 MG per tablet 1 tablet  Status:  Discontinued     1 tablet Oral 2 times daily 01/26/17 0256 01/28/17 1345     Objective: Physical Exam: Vitals:   01/27/17 2000 01/28/17 0427 01/28/17 0800 01/28/17 1301  BP: (!) 117/38 (!) 112/33 (!) 117/49 (!) 123/38  Pulse: 71 63  71  Resp: 18 18  18   Temp: 99 F (37.2 C) 98.3 F (36.8 C)  98.5 F (36.9 C)  TempSrc: Oral Oral  Oral  SpO2: 97% 94%  97%    Intake/Output Summary (Last 24 hours) at 01/28/17 1654 Last data filed at 01/28/17 1302  Gross per 24 hour  Intake  1055 ml  Output                0 ml  Net             1055 ml   There were no vitals filed for this visit.  General: Alert, Awake and Oriented to Time, Place and Person. Appear in mild distress, affect appropriate Eyes:  PERRL, Conjunctiva normal ENT: Oral Mucosa clear moist. Neck: no JVD, no Abnormal Mass Or lumps Cardiovascular: S1 and S2 Present, aortic systolic Murmur, Respiratory: Bilateral Air entry equal and Decreased, no use of accessory muscle, Clear to Auscultation, no Crackles, no wheezes Abdomen: Bowel Sound present, Soft and no tenderness Skin: no redness, no Rash, no induration Extremities: no Pedal edema, no calf tenderness Neurologic: Grossly no focal neuro deficit. Bilaterally Equal motor strength  Data Reviewed: CBC:  Recent Labs Lab 01/25/17 2130  01/27/17 0724 01/27/17 1534 01/27/17 1629 01/28/17 0508 01/28/17 1359  WBC 15.3*  < > 10.1 11.2* 12.0* 11.4* 13.2*  NEUTROABS 12.9*  --   --   --   --   --   --   HGB 12.3  < > 7.6* 7.2* 7.3* 7.8* 7.7*  HCT 36.7  < > 23.3* 22.1* 22.0* 23.7* 22.9*  MCV 92.7  < > 94.7 94.8 94.0 93.3 93.1  PLT 229  < > 149* 132* 154 140* 145*  < > = values in this interval not displayed. Basic Metabolic Panel:  Recent Labs Lab 01/25/17 2130 01/26/17 0257 01/27/17 0724 01/28/17 0508  NA 133* 134* 134* 133*  K 3.6 3.9 3.7 3.9  CL 97* 99* 98* 98*  CO2 24 26 26 28   GLUCOSE 179* 186* 122* 122*  BUN 18 18 20  26*  CREATININE 1.10* 1.00 1.11* 1.41*  CALCIUM 9.9 9.6 8.6* 8.9  MG  --   --  1.3* 1.7   Liver Function Tests: No results for input(s): AST, ALT, ALKPHOS, BILITOT, PROT, ALBUMIN in the last 168 hours. No results for input(s): LIPASE, AMYLASE in the last 168 hours. No results for input(s): AMMONIA in the last 168 hours. Coagulation Profile:  Recent Labs Lab 01/26/17 0102 01/27/17 0724  INR 1.06 1.18   Cardiac Enzymes: No results for input(s): CKTOTAL, CKMB, CKMBINDEX, TROPONINI in the last 168 hours. BNP (last 3 results) No results for input(s): PROBNP in the last 8760 hours.  CBG:  Recent Labs Lab 01/26/17 1715  GLUCAP 124*   Studies: No results found.   Scheduled Meds: . apixaban  2.5 mg Oral BID  . aspirin EC  81 mg  Oral Daily  . atorvastatin  80 mg Oral q1800  . calcium carbonate  1 tablet Oral QPM  . diltiazem  180 mg Oral Daily  . ferrous sulfate  325 mg Oral TID WC  . FLUoxetine  40 mg Oral Daily  . folic acid  1 mg Oral Daily  . levothyroxine  125 mcg Oral QAC breakfast  . metoprolol  50 mg Oral BID  . multivitamin  1 tablet Oral BID  . pantoprazole  40 mg Oral BID  . polyethylene glycol  17 g Oral QODAY  . [START ON 01/29/2017] sulfamethoxazole-trimethoprim  1 tablet Oral Daily   Continuous Infusions: . sodium chloride 75 mL/hr at 01/28/17 0900   PRN Meds: acetaminophen **OR** acetaminophen, alum & mag hydroxide-simeth, fentaNYL (SUBLIMAZE) injection, HYDROcodone-acetaminophen, HYDROcodone-acetaminophen, menthol-cetylpyridinium **OR** phenol, methocarbamol **OR** methocarbamol (ROBAXIN)  IV, metoCLOPramide **OR** metoCLOPramide (REGLAN) injection, ondansetron **OR** ondansetron (ZOFRAN) IV, temazepam  Time  spent: 30 minutes  Author: Berle Mull, MD [facility] Pager: [phone] 01/28/2017 4:54 PM  If 7PM-7AM, please contact night-coverage at [email or web address], password [facility]

## 2017-01-28 NOTE — Clinical Social Work Placement (Addendum)
   CLINICAL SOCIAL WORK PLACEMENT  NOTE  Date:  01/28/2017  Patient Details  Name: Leah Holland MRN: [id number] Date of Birth: 1922/11/11  Clinical Social Work is seeking post-discharge placement for this patient at the [place] level of care (*CSW will initial, date and re-position this form in  chart as items are completed):  Yes   Patient/family provided with [facility] Work Department's list of facilities offering this level of care within the geographic area requested by the patient (or if unable, by the patient's family).  Yes   Patient/family informed of their freedom to choose among providers that offer the needed level of care, that participate in Medicare, Medicaid or managed care program needed by the patient, have an available bed and are willing to accept the patient.  Yes   Patient/family informed of [facility]'s ownership interest in [facility] and [facility], as well as of the fact that they are under no obligation to receive care at these facilities.  PASRR submitted to EDS on       PASRR number received on 01/27/17     Existing PASRR number confirmed on       FL2 transmitted to all facilities in geographic area requested by pt/family on       FL2 transmitted to all facilities within larger geographic area on       Patient informed that his/her managed care company has contracts with or will negotiate with certain facilities, including the following:        Yes   Patient/family informed of bed offers received.  Patient chooses bed at [facility]     Physician recommends and patient chooses bed at      Patient to be transferred to  [facility] on 01/30/17.  Patient to be transferred to facility by [facility]     Patient family notified on 01/30/17 of transfer.  Name of family member notified:  [name]     PHYSICIAN       Additional Comment:     _______________________________________________ Boone Master, [place] 01/28/2017, 10:32 AM

## 2017-01-28 NOTE — Progress Notes (Signed)
PHARMACY NOTE:  ANTIMICROBIAL RENAL DOSAGE ADJUSTMENT  Current antimicrobial regimen includes a mismatch between antimicrobial dosage and estimated renal function.  As per policy approved by the Pharmacy & Therapeutics and Medical Executive Committees, the antimicrobial dosage will be adjusted accordingly.  Current antimicrobial dosage:  Bactrim 1 tablet BID  Indication: Chronic, lifelong prophylaxis for prosthetic hip infection  Renal Function: estimated CrCl ~24 mL/min based on age, weight, and SCr.   CrCl cannot be calculated (Unknown ideal weight.). []      On intermittent HD, scheduled: []      On CRRT    Antimicrobial dosage has been changed to:  Bactrim 1 tablet daily  Additional comments: Please consider if an alternative agent could be used with age and renal function.    Thank you for allowing pharmacy to be a part of this patient's care.  Brain Hilts, [facility] 01/28/2017 1:46 PM

## 2017-01-28 NOTE — Progress Notes (Signed)
Physical Therapy Treatment Patient Details Name: Leah Holland MRN: [id number] DOB: [birth year] Today's Date: 01/28/2017    History of Present Illness Pt is a 83 y/o female s/p Treatment of left intertrochanteric fracture with intramedullary implant. 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 Pt has a past surgical history that includes Total knee arthroplasty (bilateral); Cataract extraction, bilateral (Bilateral, 2000); Femur IM nail (Right, 08/01/2015); and Total hip arthroplasty (Right, 03/16/2016).    PT Comments    Pt is POD 2 and limited by pain and fatigue this session. Pt declines any OOB mobility as she was up in recliner for an hour earlier and recently got back to bed. Performed supine LE ROM and strengthening exercises to assist with improving her mobility once she is able to get OOB.    Follow Up Recommendations  SNF;Supervision/Assistance - 24 hour     Equipment Recommendations  None recommended by PT    Recommendations for Other Services       Precautions / Restrictions Precautions Precautions: Fall Precaution Comments: current injury resulted from a fall Restrictions Weight Bearing Restrictions: Yes LLE Weight Bearing: Weight bearing as tolerated    Mobility  Bed Mobility               General bed mobility comments: Pt declining OOB mobility this session.   Transfers                    Ambulation/Gait                 Stairs            Wheelchair Mobility    Modified Rankin (Stroke Patients Only)       Balance                                    Cognition Arousal/Alertness: Awake/alert Behavior During Therapy: WFL for tasks assessed/performed Overall Cognitive Status: Within Functional Limits for tasks assessed                      Exercises Total Joint Exercises Ankle Circles/Pumps: AROM;Both;20 reps;Supine Quad Sets: AROM;Left;10 reps;Supine Heel Slides: AAROM;Left;10 reps;Supine Hip ABduction/ADduction: AAROM;Left;10 reps;Supine    General Comments        Pertinent Vitals/Pain Pain Assessment: 0-10 Pain Score: 2  Pain Location: LLE Pain Descriptors / Indicators: Grimacing;Guarding;Sore Pain Intervention(s): Monitored during session;Repositioned    Home Living                      Prior Function            PT Goals (current goals can now be found in the care plan section) Acute Rehab PT Goals Patient Stated Goal: to be independent again Progress towards PT goals: Progressing toward goals    Frequency    Min 3X/week      PT Plan Current plan remains appropriate    Co-evaluation             End of Session   Activity Tolerance: Patient limited by pain Patient left: in bed;with call  bell/phone within reach     Time: 1543-1555 PT Time Calculation (min) (ACUTE ONLY): 12 min  Charges:  $Therapeutic Exercise: 8-22 mins                    G Codes:      Leah Holland PT, DPT  [phone]  01/28/2017, 4:18 PM

## 2017-01-28 NOTE — Clinical Social Work Note (Signed)
Clinical Social Work Assessment  Patient Details  Name: MIKERA SCHWEGEL MRN: [id number] Date of Birth: [birth year]  Date of referral:  01/28/17               Reason for consult:  Facility Placement                Permission sought to share information with:  Chartered certified accountant granted to share information::  Yes, Verbal Permission Granted  Name::     [place]::     Relationship::     Contact Information:     Housing/Transportation Living arrangements for the past 2 months:  [place] of Information:  Patient Patient Interpreter Needed:  None Criminal Activity/Legal Involvement Pertinent to Current Situation/Hospitalization:  No - Comment as needed Significant Relationships:  Adult Children Lives with:  Facility Resident Do you feel safe going back to the place where you live?  Yes Need for family participation in patient care:  No (Coment)  Care giving concerns:  Pt currently in independent living and will need SNF at [place].   Social Worker assessment / plan:  Pt is a resident at [facility] in independent living apartment. Pt reports she broke her hip and is aware that she will need  Rehab. Pt reports she and family have already spoken to facility and are agreeable to go to SNF at [place].   Employment status:  Retired Forensic scientist:  Medicare PT Recommendations:  [place] / Referral to community resources:  [place]  Patient/Family's Response to care:  Pt agreeable to plans.  Patient/Family's Understanding of and Emotional Response to Diagnosis, Current Treatment, and Prognosis:  Pt feels comfortable returning to facility and is grateful that she can go to rehab prior to returning to apartment.  Emotional Assessment Appearance:  Appears stated age, Well-Groomed Attitude/Demeanor/Rapport:  Other (Appropriate) Affect (typically observed):  Accepting,  Adaptable, Appropriate Orientation:  Oriented to Self, Oriented to Place, Oriented to  Time Alcohol / Substance use:  Never Used Psych involvement (Current and /or in the community):  No (Comment)  Discharge Needs  Concerns to be addressed:  No discharge needs identified Readmission within the last 30 days:  No Current discharge risk:  None Barriers to Discharge:  No Barriers Identified   Aneesha, Hilgers, [place] 01/28/2017, 10:12 AM Weekend Coverage [phone]

## 2017-01-28 NOTE — Progress Notes (Signed)
Subjective: Patient stable.  Pain controlled.   Objective: Vital signs in last 24 hours: Temp:  [98.2 F (36.8 C)-99.6 F (37.6 C)] 98.3 F (36.8 C) (02/17 0427) Pulse Rate:  [63-96] 63 (02/17 0427) Resp:  [16-18] 18 (02/17 0427) BP: (111-119)/(33-49) 117/49 (02/17 0800) SpO2:  [94 %-97 %] 94 % (02/17 0427)  Intake/Output from previous day: 02/16 0701 - 02/17 0700 In: 1055 [P.O.:720; Blood:335] Out: -  Intake/Output this shift: No intake/output data recorded.  Exam:  Sensation intact distally Dorsiflexion/Plantar flexion intact  Labs:  Recent Labs  01/26/17 0257 01/27/17 0724 01/27/17 1534 01/27/17 1629 01/28/17 0508  HGB 11.1* 7.6* 7.2* 7.3* 7.8*    Recent Labs  01/27/17 1629 01/28/17 0508  WBC 12.0* 11.4*  RBC 2.34* 2.54*  HCT 22.0* 23.7*  PLT 154 140*    Recent Labs  01/27/17 0724 01/28/17 0508  NA 134* 133*  K 3.7 3.9  CL 98* 98*  CO2 26 28  BUN 20 26*  CREATININE 1.11* 1.41*  GLUCOSE 122* 122*  CALCIUM 8.6* 8.9    Recent Labs  01/26/17 0102 01/27/17 0724  INR 1.06 1.18    Assessment/Plan: Plan to mobilize today as patient tolerates.  Hemoglobin slightly increased today.  He may be ready for discharge tomorrow versus Monday   Anderson Malta 01/28/2017, 11:25 AM

## 2017-01-29 LAB — CBC
HCT: 21.3 % — ABNORMAL LOW (ref 36.0–46.0)
Hemoglobin: 7.3 g/dL — ABNORMAL LOW (ref 12.0–15.0)
MCH: 31.7 pg (ref 26.0–34.0)
MCHC: 34.3 g/dL (ref 30.0–36.0)
MCV: 92.6 fL (ref 78.0–100.0)
PLATELETS: 139 10*3/uL — AB (ref 150–400)
RBC: 2.3 MIL/uL — AB (ref 3.87–5.11)
RDW: 14.9 % (ref 11.5–15.5)
WBC: 11 10*3/uL — AB (ref 4.0–10.5)

## 2017-01-29 LAB — BASIC METABOLIC PANEL
ANION GAP: 6 (ref 5–15)
BUN: 26 mg/dL — AB (ref 6–20)
CALCIUM: 8.6 mg/dL — AB (ref 8.9–10.3)
CO2: 28 mmol/L (ref 22–32)
Chloride: 97 mmol/L — ABNORMAL LOW (ref 101–111)
Creatinine, Ser: 1.18 mg/dL — ABNORMAL HIGH (ref 0.44–1.00)
GFR calc Af Amer: 45 mL/min — ABNORMAL LOW (ref >60)
GFR, EST NON AFRICAN AMERICAN: 39 mL/min — AB (ref >60)
Glucose, Bld: 119 mg/dL — ABNORMAL HIGH (ref 65–99)
POTASSIUM: 3.7 mmol/L (ref 3.5–5.1)
SODIUM: 131 mmol/L — AB (ref 135–145)

## 2017-01-29 LAB — PREPARE RBC (CROSSMATCH)

## 2017-01-29 LAB — HEMOGLOBIN AND HEMATOCRIT, BLOOD
HEMATOCRIT: 26.1 % — AB (ref 36.0–46.0)
Hemoglobin: 8.8 g/dL — ABNORMAL LOW (ref 12.0–15.0)

## 2017-01-29 MED ORDER — SODIUM CHLORIDE 0.9 % IV SOLN
Freq: Once | INTRAVENOUS | Status: AC
  Administered 2017-01-29: 10:00:00 via INTRAVENOUS

## 2017-01-29 MED ORDER — METHOCARBAMOL 500 MG PO TABS
500.0000 mg | ORAL_TABLET | Freq: Three times a day (TID) | ORAL | Status: DC
  Administered 2017-01-29 – 2017-01-30 (×2): 500 mg via ORAL
  Filled 2017-01-29 (×2): qty 1

## 2017-01-29 NOTE — Progress Notes (Signed)
Subjective: Patient stable.  She is resting currently.  Blood transfusion in progress.   Objective: Vital signs in last 24 hours: Temp:  [98.2 F (36.8 C)-99.3 F (37.4 C)] 99 F (37.2 C) (02/18 1030) Pulse Rate:  [71-79] 74 (02/18 1030) Resp:  [16-18] 16 (02/18 0951) BP: (111-128)/(32-43) 128/43 (02/18 1030) SpO2:  [91 %-97 %] 93 % (02/18 1030)  Intake/Output from previous day: 02/17 0701 - 02/18 0700 In: 1380 [P.O.:480; I.V.:900] Out: -  Intake/Output this shift: Total I/O In: 490 [P.O.:240; I.V.:250] Out: -   Exam:  Dorsiflexion/Plantar flexion intact  Labs:  Recent Labs  01/27/17 1534 01/27/17 1629 01/28/17 0508 01/28/17 1359 01/29/17 0356  HGB 7.2* 7.3* 7.8* 7.7* 7.3*    Recent Labs  01/28/17 1359 01/29/17 0356  WBC 13.2* 11.0*  RBC 2.46* 2.30*  HCT 22.9* 21.3*  PLT 145* 139*    Recent Labs  01/28/17 0508 01/29/17 0356  NA 133* 131*  K 3.9 3.7  CL 98* 97*  CO2 28 28  BUN 26* 26*  CREATININE 1.41* 1.18*  GLUCOSE 122* 119*  CALCIUM 8.9 8.6*    Recent Labs  01/27/17 0724  INR 1.18    Assessment/Plan: Patient was slightly confused last night.  This may be related to her low hemoglobin.  Transfusion and progress currently.  Do not anticipate discharge today to skilled nursing facility.  I discussed the case with her son Leah Holland.  Dr Erlinda Hong - can you call [facility] tomorrow after you have seen Leah Holland.  His phone [phone] - he lives in [place] and can likely not make early round appointment time   Leah Holland 01/29/2017, 11:18 AM

## 2017-01-29 NOTE — Progress Notes (Addendum)
[facility] Progress Note  Patient: Leah Holland [id number]   PCP: Jeanmarie Hubert, MD DOB: [birth year]   DOA: 01/25/2017   DOS: 01/29/2017   Date of Service: the patient was seen and examined on 01/29/2017   Subjective: Feeling better, pain has become an issue overnight for mobility. No acute events.  Brief hospital course: Pt. with PMH of HTN, HLD, hypothyroidism, A. fib on Eliquis; admitted on 01/25/2017, with complaint of fall mechanical, was found to have left hip intertrochanteric fracture. S/P intramedullary implant 01/26/2017. Patient tolerated the procedure well. Currently further plan is postoperative recovery.  Assessment and Plan: 1. Close left hip intertrochanteric fracture. S/P intramedullary implant 01/26/2017 Postoperative blood loss anemia. Stable   Transfuse 1 PRBC today Starting the patient on iron supplementation. On eliquis, change to scheduled robaxin. Minimal swelling at the surgical site. No active bleed of massive hematoma. We'll monitor.  2.Leukocytosis: WBC elevated. Patient denies having any infectious symptoms. Likely stress response. Continue to monitor  3. Atrial fibrillation on chronic anticoagulation therapy  - Continue diltiazem, metoprolol - Eliquis resumed   4. Essential hypertension Acute kidney injury. Patient has mild worsening of renal function on 01/28/2017, improved with IV fluids, encourage PO fluids, stop IV fluids. - Stop benazepril, chlorthalidone.  5. S/p right hip fracture: Previously right hip repair performed by Dr. [name] in 03/2016. 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 - Continue Bactrim    6. Hypothyroidism - Continue Synthroid  7. GERD - Continued pharmacy substitution of Protonix for Nexium  8. Osteoporosis:  Patient noted to be on Prolia Patient will need periodic bone density scan as an outpatient.  9. Hyperlipidemia - Continue  atorvastatin   10 Hyponatremia: Chronic. Sodium 133 - Continue to monitor  11. Diabetes mellitus type 2 diet controlled - Continue to monitor  12. Insomnia - Continue Restoril  13.Hypomagnesemia, replacing.  Bowel regimen: last BM 01/25/2017 continue bowel regimen, if no BM today, will use enema tomorrow Diet: cardiac diet DVT Prophylaxis: on therapeutic anticoagulation.  Advance goals of care discussion: full code  Family Communication: family was present at bedside, at the time of interview. The pt provided permission to discuss medical plan with the family. Opportunity was given to ask question and all questions were answered satisfactorily.   Disposition:  Discharge to SNF Expected discharge date: 01/29/2017. Depending on stabilization of H&H and his renal function  Consultants: Orthopedics Procedures: S/P intramedullary implant 01/26/2017  Antibiotics: Anti-infectives    Start     Dose/Rate Route Frequency Ordered Stop   01/29/17 1000  sulfamethoxazole-trimethoprim (BACTRIM,SEPTRA) 400-80 MG per tablet 1 tablet     1 tablet Oral Daily 01/28/17 1345     01/26/17 2200  ceFAZolin (ANCEF) IVPB 2g/100 mL premix     2 g 200 mL/hr over 30 Minutes Intravenous Every 6 hours 01/26/17 1812 01/27/17 1154   01/26/17 1000  sulfamethoxazole-trimethoprim (BACTRIM,SEPTRA) 400-80 MG per tablet 1 tablet  Status:  Discontinued     1 tablet Oral 2 times daily 01/26/17 0256 01/28/17 1345     Objective: Physical Exam: Vitals:   01/29/17 0600 01/29/17 0951 01/29/17 1030 01/29/17 1334  BP: (!) 118/38 (!) 116/38 (!) 128/43 (!) 141/46  Pulse: 79 72 74 82  Resp:  16  16  Temp: 98.2 F (36.8 C) 99.3 F (37.4 C) 99 F (37.2 C) 99.3 F (37.4 C)  TempSrc: Axillary Oral Oral Oral  SpO2: 93% 91% 93% 93%  Intake/Output Summary (Last 24 hours) at 01/29/17 1559 Last data filed at 01/29/17 1345  Gross per 24 hour  Intake          2211.25 ml  Output              800 ml  Net           1411.25 ml   There were no vitals filed for this visit.  General: Alert, Awake and Oriented to Time, Place and Person. Appear in mild distress, affect appropriate Eyes: PERRL, Conjunctiva normal ENT: Oral Mucosa clear moist. Neck: no JVD, no Abnormal Mass Or lumps Cardiovascular: S1 and S2 Present, aortic systolic Murmur, Respiratory: Bilateral Air entry equal and Decreased, no use of accessory muscle, Clear to Auscultation, no Crackles, no wheezes Abdomen: Bowel Sound present, Soft and no tenderness Skin: no redness, no Rash, no induration Extremities: no Pedal edema, no calf tenderness Neurologic: Grossly no focal neuro deficit. Bilaterally Equal motor strength  Data Reviewed: CBC:  Recent Labs Lab 01/25/17 2130  01/27/17 1534 01/27/17 1629 01/28/17 0508 01/28/17 1359 01/29/17 0356  WBC 15.3*  < > 11.2* 12.0* 11.4* 13.2* 11.0*  NEUTROABS 12.9*  --   --   --   --   --   --   HGB 12.3  < > 7.2* 7.3* 7.8* 7.7* 7.3*  HCT 36.7  < > 22.1* 22.0* 23.7* 22.9* 21.3*  MCV 92.7  < > 94.8 94.0 93.3 93.1 92.6  PLT 229  < > 132* 154 140* 145* 139*  < > = values in this interval not displayed. Basic Metabolic Panel:  Recent Labs Lab 01/25/17 2130 01/26/17 0257 01/27/17 0724 01/28/17 0508 01/29/17 0356  NA 133* 134* 134* 133* 131*  K 3.6 3.9 3.7 3.9 3.7  CL 97* 99* 98* 98* 97*  CO2 24 26 26 28 28   GLUCOSE 179* 186* 122* 122* 119*  BUN 18 18 20  26* 26*  CREATININE 1.10* 1.00 1.11* 1.41* 1.18*  CALCIUM 9.9 9.6 8.6* 8.9 8.6*  MG  --   --  1.3* 1.7  --    Liver Function Tests: No results for input(s): AST, ALT, ALKPHOS, BILITOT, PROT, ALBUMIN in the last 168 hours. No results for input(s): LIPASE, AMYLASE in the last 168 hours. No results for input(s): AMMONIA in the last 168 hours. Coagulation Profile:  Recent Labs Lab 01/26/17 0102 01/27/17 0724  INR 1.06 1.18   Cardiac Enzymes: No results for input(s): CKTOTAL, CKMB, CKMBINDEX, TROPONINI in the last 168 hours. BNP  (last 3 results) No results for input(s): PROBNP in the last 8760 hours.  CBG:  Recent Labs Lab 01/26/17 1715  GLUCAP 124*   Studies: No results found.   Scheduled Meds: . apixaban  2.5 mg Oral BID  . aspirin EC  81 mg Oral Daily  . atorvastatin  80 mg Oral q1800  . calcium carbonate  1 tablet Oral QPM  . diltiazem  180 mg Oral Daily  . ferrous sulfate  325 mg Oral TID WC  . FLUoxetine  40 mg Oral Daily  . folic acid  1 mg Oral Daily  . levothyroxine  125 mcg Oral QAC breakfast  . methocarbamol  500 mg Oral TID  . metoprolol  50 mg Oral BID  . multivitamin  1 tablet Oral BID  . pantoprazole  40 mg Oral BID  . polyethylene glycol  17 g Oral BID  . senna-docusate  1 tablet Oral BID  . sulfamethoxazole-trimethoprim  1 tablet Oral  Daily   Continuous Infusions:  PRN Meds: acetaminophen **OR** acetaminophen, alum & mag hydroxide-simeth, fentaNYL (SUBLIMAZE) injection, HYDROcodone-acetaminophen, HYDROcodone-acetaminophen, menthol-cetylpyridinium **OR** phenol, ondansetron **OR** ondansetron (ZOFRAN) IV, temazepam  Time spent: 30 minutes  Author: Berle Mull, MD [facility] Pager: [phone] 01/29/2017 3:59 PM  If 7PM-7AM, please contact night-coverage at [email or web address], password [facility]

## 2017-01-30 DIAGNOSIS — F411 Generalized anxiety disorder: Secondary | ICD-10-CM | POA: Diagnosis not present

## 2017-01-30 DIAGNOSIS — R531 Weakness: Secondary | ICD-10-CM | POA: Diagnosis not present

## 2017-01-30 DIAGNOSIS — E039 Hypothyroidism, unspecified: Secondary | ICD-10-CM | POA: Diagnosis not present

## 2017-01-30 DIAGNOSIS — A498 Other bacterial infections of unspecified site: Secondary | ICD-10-CM | POA: Diagnosis not present

## 2017-01-30 DIAGNOSIS — R29898 Other symptoms and signs involving the musculoskeletal system: Secondary | ICD-10-CM | POA: Diagnosis not present

## 2017-01-30 DIAGNOSIS — S72092S Other fracture of head and neck of left femur, sequela: Secondary | ICD-10-CM | POA: Diagnosis not present

## 2017-01-30 DIAGNOSIS — S72092D Other fracture of head and neck of left femur, subsequent encounter for closed fracture with routine healing: Secondary | ICD-10-CM | POA: Diagnosis not present

## 2017-01-30 DIAGNOSIS — E785 Hyperlipidemia, unspecified: Secondary | ICD-10-CM | POA: Diagnosis not present

## 2017-01-30 DIAGNOSIS — M1990 Unspecified osteoarthritis, unspecified site: Secondary | ICD-10-CM | POA: Diagnosis not present

## 2017-01-30 DIAGNOSIS — S72092A Other fracture of head and neck of left femur, initial encounter for closed fracture: Secondary | ICD-10-CM | POA: Diagnosis not present

## 2017-01-30 DIAGNOSIS — M810 Age-related osteoporosis without current pathological fracture: Secondary | ICD-10-CM | POA: Diagnosis not present

## 2017-01-30 DIAGNOSIS — K219 Gastro-esophageal reflux disease without esophagitis: Secondary | ICD-10-CM | POA: Diagnosis not present

## 2017-01-30 DIAGNOSIS — E119 Type 2 diabetes mellitus without complications: Secondary | ICD-10-CM | POA: Diagnosis not present

## 2017-01-30 DIAGNOSIS — K5900 Constipation, unspecified: Secondary | ICD-10-CM | POA: Diagnosis not present

## 2017-01-30 DIAGNOSIS — D62 Acute posthemorrhagic anemia: Secondary | ICD-10-CM | POA: Diagnosis not present

## 2017-01-30 DIAGNOSIS — W19XXXS Unspecified fall, sequela: Secondary | ICD-10-CM | POA: Diagnosis not present

## 2017-01-30 DIAGNOSIS — K440 Diaphragmatic hernia with obstruction, without gangrene: Secondary | ICD-10-CM | POA: Diagnosis not present

## 2017-01-30 DIAGNOSIS — R58 Hemorrhage, not elsewhere classified: Secondary | ICD-10-CM | POA: Diagnosis not present

## 2017-01-30 DIAGNOSIS — M25551 Pain in right hip: Secondary | ICD-10-CM | POA: Diagnosis not present

## 2017-01-30 DIAGNOSIS — I4891 Unspecified atrial fibrillation: Secondary | ICD-10-CM | POA: Diagnosis not present

## 2017-01-30 DIAGNOSIS — F5101 Primary insomnia: Secondary | ICD-10-CM | POA: Diagnosis not present

## 2017-01-30 DIAGNOSIS — D72829 Elevated white blood cell count, unspecified: Secondary | ICD-10-CM | POA: Diagnosis not present

## 2017-01-30 DIAGNOSIS — F419 Anxiety disorder, unspecified: Secondary | ICD-10-CM | POA: Diagnosis not present

## 2017-01-30 DIAGNOSIS — Z7901 Long term (current) use of anticoagulants: Secondary | ICD-10-CM | POA: Diagnosis not present

## 2017-01-30 DIAGNOSIS — S72009A Fracture of unspecified part of neck of unspecified femur, initial encounter for closed fracture: Secondary | ICD-10-CM | POA: Diagnosis not present

## 2017-01-30 DIAGNOSIS — Z471 Aftercare following joint replacement surgery: Secondary | ICD-10-CM | POA: Diagnosis not present

## 2017-01-30 DIAGNOSIS — E871 Hypo-osmolality and hyponatremia: Secondary | ICD-10-CM | POA: Diagnosis not present

## 2017-01-30 DIAGNOSIS — I10 Essential (primary) hypertension: Secondary | ICD-10-CM | POA: Diagnosis not present

## 2017-01-30 DIAGNOSIS — G4700 Insomnia, unspecified: Secondary | ICD-10-CM | POA: Diagnosis not present

## 2017-01-30 DIAGNOSIS — E1300 Other specified diabetes mellitus with hyperosmolarity without nonketotic hyperglycemic-hyperosmolar coma (NKHHC): Secondary | ICD-10-CM | POA: Diagnosis not present

## 2017-01-30 DIAGNOSIS — M21751 Unequal limb length (acquired), right femur: Secondary | ICD-10-CM | POA: Diagnosis not present

## 2017-01-30 DIAGNOSIS — T847XXS Infection and inflammatory reaction due to other internal orthopedic prosthetic devices, implants and grafts, sequela: Secondary | ICD-10-CM | POA: Diagnosis not present

## 2017-01-30 DIAGNOSIS — F339 Major depressive disorder, recurrent, unspecified: Secondary | ICD-10-CM | POA: Diagnosis not present

## 2017-01-30 DIAGNOSIS — I480 Paroxysmal atrial fibrillation: Secondary | ICD-10-CM | POA: Diagnosis not present

## 2017-01-30 DIAGNOSIS — F310 Bipolar disorder, current episode hypomanic: Secondary | ICD-10-CM | POA: Diagnosis not present

## 2017-01-30 DIAGNOSIS — Z4789 Encounter for other orthopedic aftercare: Secondary | ICD-10-CM | POA: Diagnosis not present

## 2017-01-30 DIAGNOSIS — R2681 Unsteadiness on feet: Secondary | ICD-10-CM | POA: Diagnosis not present

## 2017-01-30 DIAGNOSIS — K5909 Other constipation: Secondary | ICD-10-CM | POA: Diagnosis not present

## 2017-01-30 LAB — BASIC METABOLIC PANEL
ANION GAP: 7 (ref 5–15)
BUN: 21 mg/dL — ABNORMAL HIGH (ref 6–20)
CALCIUM: 8.8 mg/dL — AB (ref 8.9–10.3)
CO2: 29 mmol/L (ref 22–32)
Chloride: 97 mmol/L — ABNORMAL LOW (ref 101–111)
Creatinine, Ser: 0.82 mg/dL (ref 0.44–1.00)
GFR calc Af Amer: >60 mL/min (ref >60)
GLUCOSE: 122 mg/dL — AB (ref 65–99)
Potassium: 4.1 mmol/L (ref 3.5–5.1)
Sodium: 133 mmol/L — ABNORMAL LOW (ref 135–145)

## 2017-01-30 LAB — CBC
HCT: 25.3 % — ABNORMAL LOW (ref 36.0–46.0)
Hemoglobin: 8.7 g/dL — ABNORMAL LOW (ref 12.0–15.0)
MCH: 30.6 pg (ref 26.0–34.0)
MCHC: 34.4 g/dL (ref 30.0–36.0)
MCV: 89.1 fL (ref 78.0–100.0)
Platelets: 181 10*3/uL (ref 150–400)
RBC: 2.84 MIL/uL — ABNORMAL LOW (ref 3.87–5.11)
RDW: 15.8 % — AB (ref 11.5–15.5)
WBC: 10.1 10*3/uL (ref 4.0–10.5)

## 2017-01-30 MED ORDER — METHOCARBAMOL 500 MG PO TABS: 500.0000 mg | ORAL_TABLET | Freq: Three times a day (TID) | ORAL | 0 refills | Status: DC | PRN

## 2017-01-30 MED ORDER — SENNOSIDES-DOCUSATE SODIUM 8.6-50 MG PO TABS: 1.0000 | ORAL_TABLET | Freq: Every evening | ORAL | 0 refills | Status: DC | PRN

## 2017-01-30 MED ORDER — FERROUS SULFATE 325 (65 FE) MG PO TABS: 325.0000 mg | ORAL_TABLET | Freq: Three times a day (TID) | ORAL | 0 refills | Status: DC

## 2017-01-30 MED ORDER — TEMAZEPAM 30 MG PO CAPS: 30.0000 mg | ORAL_CAPSULE | Freq: Every evening | ORAL | 0 refills | Status: DC | PRN

## 2017-01-30 MED ORDER — SULFAMETHOXAZOLE-TRIMETHOPRIM 400-80 MG PO TABS
1.0000 | ORAL_TABLET | Freq: Two times a day (BID) | ORAL | Status: DC
  Administered 2017-01-30: 1 via ORAL
  Filled 2017-01-30: qty 1

## 2017-01-30 MED ORDER — POLYETHYLENE GLYCOL 3350 17 G PO PACK: 17.0000 g | PACK | Freq: Every day | ORAL | 0 refills | Status: DC

## 2017-01-30 MED ORDER — BISACODYL 10 MG RE SUPP
10.0000 mg | Freq: Every day | RECTAL | Status: DC | PRN
  Administered 2017-01-30: 10 mg via RECTAL
  Filled 2017-01-30: qty 1

## 2017-01-30 MED ORDER — FOLIC ACID 1 MG PO TABS: 1.0000 mg | ORAL_TABLET | Freq: Every day | ORAL | 0 refills | Status: DC

## 2017-01-30 NOTE — Discharge Summary (Signed)
[facility] Discharge Summary   Patient: Leah Holland [id number]   PCP: Jeanmarie Hubert, MD DOB: 04/26/1922   Date of admission: 01/25/2017   Date of discharge:  01/30/2017    Discharge Diagnoses:  Principal Problem:   Closed comminuted fracture of left hip ([place]) Active Problems:   Hypertension   Hyperlipidemia   Hypothyroidism   Paroxysmal atrial fibrillation ([place])   Diabetes mellitus type 2, diet-controlled ([place])   Leukocytosis   Fall   Admitted From: home Disposition:  SNF  Recommendations for Outpatient Follow-up:  1. Please follow up with PCP in 1 week and orthopedics as Recommended  2. Get CBC checked in 1 week  Follow-up Information    Eduard Roux, MD Follow up in 2 week(s).   Specialty:  Orthopedic Surgery Why:  For suture removal, For wound re-check Contact information: [place] [address] [phone]        Jeanmarie Hubert, MD. Schedule an appointment as soon as possible for a visit in 1 week(s).   Specialty:  Internal Medicine Why:  check CBC Contact information: [place] [id number] [phone]          Diet recommendation: cardiac diet  Activity: The patient is advised to gradually reintroduce usual activities.  Discharge Condition: good  Code Status: full code  History of present illness: As per the H and P dictated on admission, "Leah Holland is a 83 y.o. female with medical history significant of HTN, HLD, hypothyroidism, A. fib on Eliquis; who presents after having a fall. Patient notes that she was coming back to her apartment in independent living when she realized last night that she left her car door unlocked. In trying to leave one of the [employer] (so that she did not have to walk away around the building to get back in she tripped and landed on her left hip. Complaints of significant pain in the affected hip that she rates as 8 out of 10 and unable to bear any weight on the hip.  Pain worsen with any kind of movement. Previously reports sustaining a right hip fracture back in 03/2016 requiring repair performed by Dr. [name]. At baseline patient notes being very active and is able to complete all of her ADLs without assistance. She had been in her normal state of health. Denies any chest pain, shortness of breath, palpitations, lightheadedness, loss consciousness, nausea, vomiting, diarrhea, cough, fever, chills, or dysuria symptoms. "  Hospital Course:   Summary of her active problems in the hospital is as following. 1. Close left hip intertrochanteric fracture. S/P intramedullary implant 01/26/2017 Postoperative blood loss anemia. Stable   S/P 1 PRBC, Starting the patient on iron supplementation. On eliquis and aspirin which will be continued  Continue norco and robaxin. Minimal swelling at the surgical site. No active bleed of massive hematoma.  2.Leukocytosis resolved  Patient denies having any infectious symptoms. Likely stress response. Continue to monitor  3. Atrial fibrillation onchronic anticoagulation therapy  - Continue diltiazem, metoprolol - Eliquis resumed  4. Essential hypertension Acute kidney injury. Patient has mild worsening of renal function on 01/28/2017, improved with IV fluids, encourage PO fluids, stop IV fluids. - Stop benazepril, chlorthalidone.  5. S/p right hip fracture: Previously right hip repair performed by Dr. 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 - Continue Bactrim   6. Hypothyroidism - Continue Synthroid  7. GERD - Continued Nexium  8. Osteoporosis:  Patient noted to be on Prolia Patient will need periodic bone density scan as an outpatient.  9. Hyperlipidemia - Continue atorvastatin   10 Hyponatremia: Chronic.   11. Diabetes mellitus type 2 diet controlled  Continue to monitor  12. Insomnia - Continue  Restoril  13.Hypomagnesemia,  Replaced.  All other chronic medical condition were stable during the hospitalization.  Patient was seen by physical therapy, who recommended SNF, which was arranged by Education officer, museum and case Freight forwarder. On the day of the discharge the patient's vitals were stable, and no other acute medical condition were reported by patient. the patient was felt safe to be discharge at SNF with thrapy.  Procedures and Results:  S/P intramedullary implant 01/26/2017 by Dr Erlinda Hong   Consultations:  Orthopedics   DISCHARGE MEDICATION: Current Discharge Medication List    START taking these medications   Details  !! apixaban (ELIQUIS) 2.5 MG TABS tablet Take 1 tablet (2.5 mg total) by mouth 2 (two) times daily. Qty: 20 tablet, Refills: 0    ferrous sulfate 325 (65 FE) MG tablet Take 1 tablet (325 mg total) by mouth 3 (three) times daily with meals. Qty: 90 tablet, Refills: 0    folic acid (FOLVITE) 1 MG tablet Take 1 tablet (1 mg total) by mouth daily. Qty: 30 tablet, Refills: 0    HYDROcodone-acetaminophen (NORCO) 7.5-325 MG tablet Take 1-2 tablets by mouth every 6 (six) hours as needed for moderate pain. Qty: 90 tablet, Refills: 0    methocarbamol (ROBAXIN) 500 MG tablet Take 1 tablet (500 mg total) by mouth every 8 (eight) hours as needed for muscle spasms. Qty: 15 tablet, Refills: 0    senna-docusate (SENOKOT-S) 8.6-50 MG tablet Take 1 tablet by mouth at bedtime as needed for mild constipation. Qty: 30 tablet, Refills: 0     !! - Potential duplicate medications found. Please discuss with provider.    CONTINUE these medications which have CHANGED   Details  polyethylene glycol (MIRALAX / GLYCOLAX) packet Take 17 g by mouth daily. Qty: 14 each, Refills: 0    temazepam (RESTORIL) 30 MG capsule Take 1 capsule (30 mg total) by mouth at bedtime as needed for sleep. Qty: 2 capsule, Refills: 0      CONTINUE these medications which have NOT CHANGED   Details    acetaminophen (TYLENOL) 325 MG tablet Take 650 mg by mouth every 6 (six) hours as needed for moderate pain.     aspirin EC 81 MG tablet Take 81 mg by mouth daily.    atorvastatin (LIPITOR) 80 MG tablet Take 80 mg by mouth daily.    calcium carbonate (OS-CAL - DOSED IN MG OF ELEMENTAL CALCIUM) 1250 (500 Ca) MG tablet Take 1 tablet by mouth every evening.    Cholecalciferol 2000 units CAPS Take 2,000 Units by mouth every evening.    denosumab (PROLIA) 60 MG/ML SOLN injection Inject 60 mg into the skin every 6 (six) months. Administer in upper arm, thigh, or abdomen    diltiazem (CARDIZEM CD) 180 MG 24 hr capsule Take 1 capsule (180 mg total) by mouth daily. Qty: 30 capsule, Refills: 10    !! ELIQUIS 2.5 MG TABS tablet TAKE 1 TABLET TWICE DAILY. Qty: 60 tablet, Refills: 5    esomeprazole (NEXIUM) 20 MG capsule Take 20 mg by mouth daily at 12 noon.    Flaxseed, Linseed, (FLAXSEED OIL) 1000 MG CAPS 1 pill po bid Refills: 0    FLUoxetine (PROZAC) 40 MG capsule Take 40  mg by mouth daily.     furosemide (LASIX) 20 MG tablet Take 1 tablet (20 mg total) by mouth as needed for edema. Qty: 30 tablet, Refills: 5    metoprolol (LOPRESSOR) 50 MG tablet Take 50 mg by mouth 2 (two) times daily.    Multiple Vitamins-Minerals (MACULAR VITAMIN BENEFIT) TABS 1 pill by mouth twice a day. There is no dose for multivitamin.    potassium chloride (K-DUR) 10 MEQ tablet Take 1 tablet (10 mEq total) by mouth as needed. Qty: 30 tablet, Refills: 5    sulfamethoxazole-trimethoprim (BACTRIM) 400-80 MG tablet Take 1 tablet by mouth 2 (two) times daily. Qty: 60 tablet, Refills: 8    SYNTHROID 125 MCG tablet TAKE 1 TABLET ONCE DAILY BEFORE BREAKFAST. Qty: 30 tablet, Refills: 2     !! - Potential duplicate medications found. Please discuss with provider.    STOP taking these medications     benazepril (LOTENSIN) 40 MG tablet      chlorthalidone (HYGROTON) 25 MG tablet        Allergies  Allergen  Reactions  . Levaquin [Levofloxacin] Other (See Comments)    tendinopathy  . Morphine And Related Other (See Comments)    Made pt "wild"   Discharge Instructions    Diet - low sodium heart healthy    Complete by:  As directed    Discharge instructions    Complete by:  As directed    It is important that you read following instructions as well as go over your medication list with RN to help you understand your care after this hospitalization.  Discharge Instructions: Please follow-up with PCP in one week  Please request your primary care physician to go over all Hospital Tests and Procedure/Radiological results at the follow up,  Please get all Hospital records sent to your PCP by signing hospital release before you go home.   Do not drive, operating heavy machinery, perform activities at heights, swimming or participation in water activities or provide baby sitting services while your are on Pain, Sleep and Anxiety Medications; until you have been seen by Primary Care Physician or a Neurologist and advised to do so again. Do not take more than prescribed Pain, Sleep and Anxiety Medications. You were cared for by a hospitalist during your hospital stay. If you have any questions about your discharge medications or the care you received while you were in the hospital after you are discharged, you can call the unit and ask to speak with the hospitalist on call if the hospitalist that took care of you is not available.  Once you are discharged, your primary care physician will handle any further medical issues. Please note that NO REFILLS for any discharge medications will be authorized once you are discharged, as it is imperative that you return to your primary care physician (or establish a relationship with a primary care physician if you do not have one) for your aftercare needs so that they can reassess your need for medications and monitor your lab values. You Must read complete  instructions/literature along with all the possible adverse reactions/side effects for all the Medicines you take and that have been prescribed to you. Take any new Medicines after you have completely understood and accept all the possible adverse reactions/side effects. Wear Seat belts while driving. If you have smoked or chewed Tobacco in the last 2 yrs please stop smoking and/or stop any Recreational drug use.   Increase activity slowly    Complete by:  As directed    Weight bearing as tolerated    Complete by:  As directed      Discharge Exam: There were no vitals filed for this visit. Vitals:   01/29/17 2025 01/30/17 0423  BP: (!) 135/33 (!) 140/54  Pulse: 88 73  Resp: 16 16  Temp: 99 F (37.2 C) 98.9 F (37.2 C)   General: Appear in no distress, no Rash; Oral Mucosa moist. Cardiovascular: S1 and S2 Present, no Murmur, no JVD Respiratory: Bilateral Air entry present and Clear to Auscultation, no Crackles, no wheezes Abdomen: Bowel Sound present, Soft and no tenderness Extremities: no Pedal edema, no calf tenderness Neurology: Grossly no focal neuro deficit.  The results of significant diagnostics from this hospitalization (including imaging, microbiology, ancillary and laboratory) are listed below for reference.    Significant Diagnostic Studies: Dg Chest 2 View  Result Date: 01/25/2017 CLINICAL DATA:  Status post fall.  Congestion.  Initial encounter. EXAM: CHEST  2 VIEW COMPARISON:  Chest radiograph performed 03/16/2016 FINDINGS: The lungs are well-aerated. Minimal left basilar atelectasis is noted. There is no evidence of pleural effusion or pneumothorax. The heart is borderline enlarged. No acute osseous abnormalities are seen. Scattered vascular calcifications are seen. IMPRESSION: Borderline cardiomegaly.  Minimal left basilar atelectasis noted. Electronically Signed   By: Garald Balding M.D.   On: 01/25/2017 23:59   Ct Head Wo Contrast  Result Date: 01/25/2017 CLINICAL  DATA:  Fall EXAM: CT HEAD WITHOUT CONTRAST CT CERVICAL SPINE WITHOUT CONTRAST TECHNIQUE: Multidetector CT imaging of the head and cervical spine was performed following the standard protocol without intravenous contrast. Multiplanar CT image reconstructions of the cervical spine were also generated. COMPARISON:  None. FINDINGS: CT HEAD FINDINGS Brain: No mass lesion, intraparenchymal hemorrhage or extra-axial collection. No evidence of acute cortical infarct. Brain parenchyma and CSF-containing spaces are normal for age. Vascular: No hyperdense vessel or unexpected calcification. Skull: Normal visualized skull base, calvarium and extracranial soft tissues. Sinuses/Orbits: No sinus fluid levels or advanced mucosal thickening. No mastoid effusion. Normal orbits. CT CERVICAL SPINE FINDINGS Alignment: Grade 1 anterolisthesis at C2-C3. Facets are aligned. Occipital condyles are normally positioned. Skull base and vertebrae: No acute fracture. Soft tissues and spinal canal: No prevertebral fluid or swelling. No visible canal hematoma. Disc levels: Multilevel severe disc space loss with osteophyte formation and facet hypertrophy. Severe left C5-6 foraminal stenosis. No bony spinal canal stenosis. Upper chest: No pneumothorax, pulmonary nodule or pleural effusion. Other: Normal visualized paraspinal cervical soft tissues. IMPRESSION: 1. No acute intracranial abnormality. 2. No acute fracture of the cervical spine. 3. Marked cervical degenerative disc disease and facet hypertrophy. Severe left C5-6 foraminal stenosis. Electronically Signed   By: Ulyses Jarred M.D.   On: 01/25/2017 22:31   Ct Cervical Spine Wo Contrast  Result Date: 01/25/2017 CLINICAL DATA:  Fall EXAM: CT HEAD WITHOUT CONTRAST CT CERVICAL SPINE WITHOUT CONTRAST TECHNIQUE: Multidetector CT imaging of the head and cervical spine was performed following the standard protocol without intravenous contrast. Multiplanar CT image reconstructions of the cervical  spine were also generated. COMPARISON:  None. FINDINGS: CT HEAD FINDINGS Brain: No mass lesion, intraparenchymal hemorrhage or extra-axial collection. No evidence of acute cortical infarct. Brain parenchyma and CSF-containing spaces are normal for age. Vascular: No hyperdense vessel or unexpected calcification. Skull: Normal visualized skull base, calvarium and extracranial soft tissues. Sinuses/Orbits: No sinus fluid levels or advanced mucosal thickening. No mastoid effusion. Normal orbits. CT CERVICAL SPINE FINDINGS Alignment: Grade 1 anterolisthesis at C2-C3. Facets are  aligned. Occipital condyles are normally positioned. Skull base and vertebrae: No acute fracture. Soft tissues and spinal canal: No prevertebral fluid or swelling. No visible canal hematoma. Disc levels: Multilevel severe disc space loss with osteophyte formation and facet hypertrophy. Severe left C5-6 foraminal stenosis. No bony spinal canal stenosis. Upper chest: No pneumothorax, pulmonary nodule or pleural effusion. Other: Normal visualized paraspinal cervical soft tissues. IMPRESSION: 1. No acute intracranial abnormality. 2. No acute fracture of the cervical spine. 3. Marked cervical degenerative disc disease and facet hypertrophy. Severe left C5-6 foraminal stenosis. Electronically Signed   By: Ulyses Jarred M.D.   On: 01/25/2017 22:31   Dg C-arm 1-60 Min  Result Date: 01/26/2017 CLINICAL DATA:  ORIF left proximal femur fracture EXAM: LEFT FEMUR 2 VIEWS; DG C-ARM 61-120 MIN COMPARISON:  01/25/2017 left hip radiograph FINDINGS: Fluoroscopy time 1 minutes 30 seconds. Multiple nondiagnostic spot fluoroscopic intraoperative radiographs of the left femur demonstrate postsurgical changes from transfixation of the comminuted intertrochanteric proximal left femur fracture by intramedullary rod with interlocking left femoral neck screws and distal interlocking screw. Partially visualized left total knee arthroplasty. IMPRESSION: Intraoperative  fluoroscopic guidance for ORIF left proximal femur fracture. Electronically Signed   By: Ilona Sorrel M.D.   On: 01/26/2017 16:45   Dg Hip Unilat W Or Wo Pelvis 2-3 Views Left  Result Date: 01/25/2017 CLINICAL DATA:  Status post fall, with generalized left hip pain. Initial encounter. EXAM: DG HIP (WITH OR WITHOUT PELVIS) 2-3V LEFT COMPARISON:  None. FINDINGS: There is a comminuted left femoral intertrochanteric fracture, with shortening, and displacement of a lesser trochanteric fragment. The patient's right hip arthroplasty is grossly unremarkable in appearance, though incompletely characterized, without evidence of loosening. The left femoral head remains seated at the acetabulum, with minimal superior joint space narrowing noted. The sacroiliac joints are unremarkable in appearance. The visualized bowel gas pattern is grossly unremarkable in appearance. IMPRESSION: Comminuted left femoral intertrochanteric fracture, with shortening, and displacement of a lesser trochanteric fragment. Electronically Signed   By: Garald Balding M.D.   On: 01/25/2017 22:11   Dg Femur Min 2 Views Left  Result Date: 01/26/2017 CLINICAL DATA:  ORIF left proximal femur fracture EXAM: LEFT FEMUR 2 VIEWS; DG C-ARM 61-120 MIN COMPARISON:  01/25/2017 left hip radiograph FINDINGS: Fluoroscopy time 1 minutes 30 seconds. Multiple nondiagnostic spot fluoroscopic intraoperative radiographs of the left femur demonstrate postsurgical changes from transfixation of the comminuted intertrochanteric proximal left femur fracture by intramedullary rod with interlocking left femoral neck screws and distal interlocking screw. Partially visualized left total knee arthroplasty. IMPRESSION: Intraoperative fluoroscopic guidance for ORIF left proximal femur fracture. Electronically Signed   By: Ilona Sorrel M.D.   On: 01/26/2017 16:45    Microbiology: Recent Results (from the past 240 hour(s))  Surgical pcr screen     Status: None   Collection  Time: 01/26/17  3:24 AM  Result Value Ref Range Status   MRSA, PCR NEGATIVE NEGATIVE Final   Staphylococcus aureus NEGATIVE NEGATIVE Final    Comment:        The Xpert SA Assay (FDA approved for NASAL specimens in patients over 11 years of age), is one component of a comprehensive surveillance program.  Test performance has been validated by [facility] for patients greater than or equal to 27 year old. It is not intended to diagnose infection nor to guide or monitor treatment.      Labs: CBC:  Recent Labs Lab 01/25/17 2130  01/27/17 1629 01/28/17 [id number] 01/28/17 1359 01/29/17 [id number]  01/29/17 1546 01/30/17 0311  WBC 15.3*  < > 12.0* 11.4* 13.2* 11.0*  --  10.1  NEUTROABS 12.9*  --   --   --   --   --   --   --   HGB 12.3  < > 7.3* 7.8* 7.7* 7.3* 8.8* 8.7*  HCT 36.7  < > 22.0* 23.7* 22.9* 21.3* 26.1* 25.3*  MCV 92.7  < > 94.0 93.3 93.1 92.6  --  89.1  PLT 229  < > 154 140* 145* 139*  --  181  < > = values in this interval not displayed. Basic Metabolic Panel:  Recent Labs Lab 01/26/17 0257 01/27/17 0724 01/28/17 0508 01/29/17 0356 01/30/17 0311  NA 134* 134* 133* 131* 133*  K 3.9 3.7 3.9 3.7 4.1  CL 99* 98* 98* 97* 97*  CO2 26 26 28 28 29   GLUCOSE 186* 122* 122* 119* 122*  BUN 18 20 26* 26* 21*  CREATININE 1.00 1.11* 1.41* 1.18* 0.82  CALCIUM 9.6 8.6* 8.9 8.6* 8.8*  MG  --  1.3* 1.7  --   --    CBG:  Recent Labs Lab 01/26/17 1715  GLUCAP 124*   Time spent: 30 minutes  Signed:  Everett Ehrler  [facility]  01/30/2017  , 10:15 AM

## 2017-01-30 NOTE — Progress Notes (Signed)
   Subjective:  Patient reports pain as mild.    Objective:   VITALS:   Vitals:   01/29/17 1030 01/29/17 1334 01/29/17 2025 01/30/17 0423  BP: (!) 128/43 (!) 141/46 (!) 135/33 (!) 140/54  Pulse: 74 82 88 73  Resp:  16 16 16   Temp: 99 F (37.2 C) 99.3 F (37.4 C) 99 F (37.2 C) 98.9 F (37.2 C)  TempSrc: Oral Oral Oral Oral  SpO2: 93% 93% 93% 91%    Neurologically intact Neurovascular intact Sensation intact distally Intact pulses distally Dorsiflexion/Plantar flexion intact Incision: dressing C/D/I and no drainage No cellulitis present Compartment soft   Lab Results  Component Value Date   WBC 10.1 01/30/2017   HGB 8.7 (L) 01/30/2017   HCT 25.3 (L) 01/30/2017   MCV 89.1 01/30/2017   PLT 181 01/30/2017     Assessment/Plan:  4 Days Post-Op   - Expected postop acute blood loss anemia - will monitor for symptoms - Up with PT/OT - DVT ppx - SCDs, ambulation, eliquis - WBAT operative extremity - Pain control - Discharge planning - stable for dc to SNF from my stand point   Eduard Roux 01/30/2017, 7:42 AM [phone]

## 2017-01-30 NOTE — Clinical Social Work Note (Addendum)
Clinical Social Worker facilitated patient discharge including contacting patient family and facility to confirm patient discharge plans.  Clinical information faxed to facility and family agreeable with plan.  CSW arranged ambulance transport via [facility] (1:00)  to [facility].  RN to call [phone] for report prior to discharge. Patient going to room 51.  Clinical Social Worker will sign off for now as social work intervention is no longer needed. Please consult [place] again if new need arises.  [address] [place]

## 2017-01-30 NOTE — Progress Notes (Signed)
Received call back from [facility] at [facility]. All questions/concerns addressed

## 2017-01-30 NOTE — Progress Notes (Signed)
Physical Therapy Treatment Patient Details Name: Leah Holland MRN: [id number] DOB: 06/01/1922 Today's Date: 01/30/2017    History of Present Illness Pt is a 83 y/o female s/p Treatment of left intertrochanteric fracture with intramedullary implant. 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 Pt has a past surgical history that includes Total knee arthroplasty (bilateral); Cataract extraction, bilateral (Bilateral, 2000); Femur IM nail (Right, 08/01/2015); and Total hip arthroplasty (Right, 03/16/2016).    PT Comments    Patient is making gradual progress toward mobility and tolerated very short gait distance this session. Continue to progress as tolerated with anticipated d/c to SNF for further skilled PT services.     Follow Up Recommendations  SNF;Supervision/Assistance - 24 hour     Equipment Recommendations  None recommended by PT    Recommendations for Other Services       Precautions / Restrictions Precautions Precautions: Fall Precaution Comments: current injury resulted from a fall Restrictions Weight Bearing Restrictions: Yes LLE Weight Bearing: Weight bearing as tolerated    Mobility  Bed Mobility Overal bed mobility: Needs Assistance Bed Mobility: Supine to Sit     Supine to sit: HOB elevated;Mod assist;+2 for physical assistance     General bed mobility comments: assist to elevate trunk into sitting and scoot hips to EOB with use of bed pad; pt able to bring bilat LE to EOB with increased  time and vc; +2 needed upon sitting due to posterior lean   Transfers Overall transfer level: Needs assistance Equipment used: Rolling walker (2 wheeled) Transfers: Sit to/from Stand Sit to Stand: Min assist         General transfer comment: assist to power up into standing with cues for hand placement and technique  Ambulation/Gait Ambulation/Gait assistance: Min assist Ambulation Distance (Feet): 4 Feet Assistive device: Rolling walker (2 wheeled) Gait Pattern/deviations: Step-to pattern;Decreased stance time - left;Decreased step length - right;Decreased weight shift to left;Trunk flexed Gait velocity: decreased   General Gait Details: cues for posture, sequencing, and proximity of RW; pt limited by nausea and pain   Stairs            Wheelchair Mobility    Modified Rankin (Stroke Patients Only)       Balance     Sitting balance-Leahy Scale: Fair       Standing balance-Leahy Scale: Poor                      Cognition Arousal/Alertness: Awake/alert Behavior During Therapy: WFL for tasks assessed/performed Overall Cognitive Status: Within Functional Limits for tasks assessed                      Exercises Total Joint Exercises Ankle Circles/Pumps: AROM;Both;Supine;10 reps Quad Sets: AROM;Left;10 reps Heel Slides: AAROM;Left;10 reps;Supine    General Comments        Pertinent Vitals/Pain Pain Assessment: Faces Faces Pain Scale: Hurts even more Pain Location: LLE Pain Descriptors / Indicators: Grimacing;Guarding;Sore Pain Intervention(s): Limited activity within patient's tolerance;Monitored during session;Premedicated before session;Repositioned    Home Living  Prior Function            PT Goals (current goals can now be found in the care plan section) Acute Rehab PT Goals Patient Stated Goal: to be independent again Progress towards PT goals: Progressing toward goals    Frequency    Min  3X/week      PT Plan Current plan remains appropriate    Co-evaluation             End of Session Equipment Utilized During Treatment: Gait belt Activity Tolerance: Patient limited by pain;Other (comment) (nausea) Patient left: with call bell/phone within reach;in chair Nurse Communication: Mobility status       Time: [id number] PT Time Calculation (min) (ACUTE ONLY): 15 min  Charges:  $Gait Training: 8-22 mins                    G Codes:       Salina April, PTA Pager: [phone]   01/30/2017, 4:06 PM

## 2017-01-30 NOTE — Progress Notes (Signed)
Attempted to call report to [facility]) unable to receive. Left callback number. PTAR to transport

## 2017-01-30 NOTE — Care Management Note (Signed)
Case Management Note  Patient Details  Name: Leah Holland MRN: [id number] Date of Birth: [birth year]  Subjective/Objective:                    Action/Plan: Pt discharged to [facility]. No further needs per CM.   Expected Discharge Date:  01/30/17               Expected Discharge Plan:  [place]  In-House Referral:     Discharge planning Services     Post Acute Care Choice:    Choice offered to:     DME Arranged:    DME Agency:     HH Arranged:    [facility] Agency:     Status of Service:  Completed, signed off  If discussed at [employer], dates discussed:    Additional Comments:  Pollie Friar, RN 01/30/2017, 3:44 PM

## 2017-01-30 NOTE — Care Management Important Message (Signed)
Important Message  Patient Details  Name: Leah Holland MRN: [id number] Date of Birth: Aug 14, 1922   Medicare Important Message Given:  Yes    Orbie Pyo 01/30/2017, 12:49 PM

## 2017-01-30 NOTE — Progress Notes (Signed)
I have attempted to reach patient's son twice today unsuccessfully.  He does not have an active voicemail.  I will plan on seeing them back in 2 weeks postop.

## 2017-01-31 LAB — TYPE AND SCREEN
ABO/RH(D): O NEG
Antibody Screen: NEGATIVE
Unit division: 0
Unit division: 0
Unit division: 0
Unit division: 0

## 2017-02-01 ENCOUNTER — Telehealth: Payer: Self-pay

## 2017-02-01 NOTE — Telephone Encounter (Signed)
Possible re-admission to facility. This is a patient you were seeing at [facility] . [facility] F/U is needed if patient was re-admitted to facility upon discharge. Hospital discharge from [facility] on 01/30/17.

## 2017-02-03 ENCOUNTER — Non-Acute Institutional Stay (SKILLED_NURSING_FACILITY): Payer: Medicare Other | Admitting: Internal Medicine

## 2017-02-03 ENCOUNTER — Encounter: Payer: Self-pay | Admitting: Internal Medicine

## 2017-02-03 DIAGNOSIS — Z7901 Long term (current) use of anticoagulants: Secondary | ICD-10-CM | POA: Diagnosis not present

## 2017-02-03 DIAGNOSIS — F310 Bipolar disorder, current episode hypomanic: Secondary | ICD-10-CM

## 2017-02-03 DIAGNOSIS — E871 Hypo-osmolality and hyponatremia: Secondary | ICD-10-CM

## 2017-02-03 DIAGNOSIS — D62 Acute posthemorrhagic anemia: Secondary | ICD-10-CM | POA: Diagnosis not present

## 2017-02-03 DIAGNOSIS — I480 Paroxysmal atrial fibrillation: Secondary | ICD-10-CM

## 2017-02-03 DIAGNOSIS — E039 Hypothyroidism, unspecified: Secondary | ICD-10-CM

## 2017-02-03 DIAGNOSIS — I10 Essential (primary) hypertension: Secondary | ICD-10-CM | POA: Diagnosis not present

## 2017-02-03 DIAGNOSIS — E119 Type 2 diabetes mellitus without complications: Secondary | ICD-10-CM | POA: Diagnosis not present

## 2017-02-03 DIAGNOSIS — E785 Hyperlipidemia, unspecified: Secondary | ICD-10-CM

## 2017-02-03 DIAGNOSIS — S72092A Other fracture of head and neck of left femur, initial encounter for closed fracture: Secondary | ICD-10-CM | POA: Diagnosis not present

## 2017-02-03 NOTE — Progress Notes (Signed)
History and Physical     Location:  [facility] Room Number: N51 Place of Service:  SNF (31)  PCP: Jeanmarie Hubert, MD Patient Care Team: Estill Dooms, MD as PCP - General (Internal Medicine) Earlie Server, MD as Consulting Physician (Orthopedic Surgery) Lorretta Harp, MD as Consulting Physician (Cardiology) Calvert Cantor, MD as Consulting Physician (Ophthalmology) [name], MD as Attending Physician [facility] Medicine)  Extended Emergency Contact Information Primary Emergency Contact: Ponciano Ort Address: [address]          [place], [place] [address] Johnnette Litter of [place] Phone: [phone] Relation: Son Secondary Emergency Contact: [place], [place] [address] Johnnette Litter of [place] Phone: [phone] Relation: Son  Code Status: FULL CODE Goals of Care: Advanced Directive information Advanced Directives 02/03/2017  Does Patient Have a Medical Advance Directive? Yes  Type of Advance Directive [place]  Does patient want to make changes to medical advance directive? -  Copy of Abir Eroh in Chart? Yes  Would patient like information on creating a medical advance directive? -  Pre-existing out of facility DNR order (yellow form or pink MOST form) -      Chief Complaint  Patient presents with  . New Admit To SNF    following hospitalization 01/25/17 to 01/30/17 closed comminuted fracture left hip     HPI: Patient is a 83 y.o. female seen today for admission to [facility] SNF following hospitalization from 01/25/17 to 01/30/17 for a recurrent fracture of the left hip sustained in a fall. She thinks she turned around too fast and lkost her balance. This is the 5th time she has had a traumatic hip injury. Dr. Erlinda Hong used screws and a plate to treat this fx.  She has several other medical issues that are stable.   She is admitted to SNF for strengthening and therapies  to resume safe mobility.   She says her pain control is not good, but she has been avoiding the use pf pain medication because she previously had to go through withdrawal when she was an on fentanyl patch.  Past Medical History:  Diagnosis Date  . Anxiety attack    "take RX prn" (08/13/2013)  . Arthritis    "probably" (08/13/2013)  . Atrial fibrillation ([place])    new onset/pt report 08/13/2013  . Bronchitis 12/2011  . Complication of anesthesia   . Depression   . Diabetes mellitus type 2, diet-controlled ([place])   . GERD (gastroesophageal reflux disease)   . H/O hiatal hernia   . Hardware complicating wound infection ([place]) 08/22/2016  . History of blood transfusion    "w/both knee OR's" (08/13/2013)  . Hyperlipidemia   . Hyperlipidemia   . Hypertension   . Hypothyroidism    takes synthroid  . Insomnia   . Intertrochanteric fracture of right hip ([place]) 07/30/2015  . Neuromuscular disorder ([place])    carpal tunnel in right hand  . Periprosthetic fracture around internal prosthetic right hip joint ([place]), nonunion basicervical femoral neck with broken hardware and subtrochanteric fracture 03/16/2016  . PONV (postoperative nausea and vomiting)   . 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 Proteus infection 08/22/2016  . Serratia infection 11/14/2016  . Skin cancer of face 2000's   "one on each side of my face and one on my nose" (08/13/2013)  . Swelling of extremity    sees Dr. Gwenlyn Found, cardiologist 1 x  year [phone]  . Tendon dysfunction 08/22/2016   Past Surgical History:  Procedure Laterality Date  . 2D ECHOCARDIOGRAM  12/07/2004   EF 48%, moderate pulmonary hypertension  . BREAST BIOPSY Left ?1970's  . CARDIOVASCULAR STRESS TEST  12/07/11  . CARDIOVASCULAR STRESS TEST  12/07/2011   LEXISCAN, normal scan, no significant wall abnormalities noted  . CARPAL TUNNEL RELEASE Right ~ 2005  . CATARACT EXTRACTION, BILATERAL Bilateral 2000  . DILATION AND CURETTAGE OF UTERUS  1950's  . DOPPLER  ECHOCARDIOGRAPHY  12/07/11  . EYE SURGERY    . FEMUR IM NAIL Right 08/01/2015   Procedure: INTRAMEDULLARY (IM) NAIL FEMORAL;  Surgeon: Marchia Bond, MD;  Location: [facility];  Service: Orthopedics;  Laterality: Right;  . HARDWARE REMOVAL Right 03/16/2016   Procedure: HARDWARE REMOVAL;  Surgeon: Marchia Bond, MD;  Location: [facility];  Service: Orthopedics;  Laterality: Right;  . INTRAMEDULLARY (IM) NAIL INTERTROCHANTERIC Left 01/26/2017   Procedure: INTRAMEDULLARY (IM) NAIL INTERTROCHANTRIC;  Surgeon: Leandrew Koyanagi, MD;  Location: [facility];  Service: Orthopedics;  Laterality: Left;  . JOINT REPLACEMENT     bilateral knee replacement  . SKIN CANCER EXCISION  2000's   "off my nose" (08/13/2013)  . TOTAL HIP ARTHROPLASTY Right 03/16/2016   Procedure: TOTAL HIP ARTHROPLASTY;  Surgeon: Marchia Bond, MD;  Location: [facility];  Service: Orthopedics;  Laterality: Right;  . TOTAL KNEE ARTHROPLASTY Right 2005  . TOTAL KNEE ARTHROPLASTY  03/02/2012   Procedure: TOTAL KNEE ARTHROPLASTY;  Surgeon: Yvette Rack., MD;  Location: [facility];  Service: Orthopedics;  Laterality: Left;    reports that she has never smoked. She has never used smokeless tobacco. She reports that she does not drink alcohol or use drugs. Social History   Social History  . Marital status: Widowed    Spouse name: N/A  . Number of children: N/A  . Years of education: N/A   Occupational History  . Not on file.   Social History Main Topics  . Smoking status: Never Smoker  . Smokeless tobacco: Never Used  . Alcohol use No  . Drug use: No  . Sexual activity: No   Other Topics Concern  . Not on file   Social History Narrative   Widowed. Lives at [facility]. Son died in the [place] War and she has not slept well since then.   Admitted to [facility] 01/30/17   Never smoked   Alcohol none   Full Code    Functional Status Survey:    Family History  Problem Relation Age of Onset  . Arrhythmia Mother   . Heart disease  Mother   . Arrhythmia Sister   . Stroke Father 50  . Heart disease Brother   . Heart disease Brother   . Cancer Brother     Colon cancer  . Arrhythmia Brother   . Heart disease Brother   . Heart disease Sister   . Heart disease Sister   . Stroke Sister   . Diabetes Sister   . Heart disease Child 36  . Heart disease Child 90    Health Maintenance  Topic Date Due  . FOOT EXAM  09/10/1934  . OPHTHALMOLOGY EXAM  09/10/1934  . URINE MICROALBUMIN  09/10/1934  . TETANUS/TDAP  09/11/1943  . DEXA SCAN  09/10/1989  . PNA vac Low Risk Adult (1 of 2 - PCV13) 09/10/1989  . HEMOGLOBIN A1C  02/09/2016  . INFLUENZA VACCINE  Completed    Allergies  Allergen  Reactions  . Levaquin [Levofloxacin] Other (See Comments)    tendinopathy  . Morphine And Related Other (See Comments)    Made pt "wild"    Allergies as of 02/03/2017      Reactions   Levaquin [levofloxacin] Other (See Comments)   tendinopathy   Morphine And Related Other (See Comments)   Made pt "wild"      Medication List       Accurate as of 02/03/17 11:02 AM. Always use your most recent med list.          acetaminophen 325 MG tablet Commonly known as:  TYLENOL Take 650 mg by mouth every 6 (six) hours as needed for moderate pain.   aspirin EC 81 MG tablet Take 81 mg by mouth daily.   atorvastatin 80 MG tablet Commonly known as:  LIPITOR Take 80 mg by mouth daily.   calcium carbonate 1250 (500 Ca) MG tablet Commonly known as:  OS-CAL - dosed in mg of elemental calcium Take 1 tablet by mouth every evening.   Cholecalciferol 2000 units Caps Take 2,000 Units by mouth every evening.   denosumab 60 MG/ML Soln injection Commonly known as:  PROLIA Inject 60 mg into the skin every 6 (six) months. Administer in upper arm, thigh, or abdomen   diltiazem 180 MG 24 hr capsule Commonly known as:  CARDIZEM CD Take 1 capsule (180 mg total) by mouth daily.   ELIQUIS 2.5 MG Tabs tablet Generic drug:  apixaban TAKE 1  TABLET TWICE DAILY.   ferrous sulfate 325 (65 FE) MG tablet Take 1 tablet (325 mg total) by mouth 3 (three) times daily with meals.   Flaxseed Oil 1000 MG Caps Take 1,000 mg by mouth. Take one capsule twice a day   FLUoxetine 40 MG capsule Commonly known as:  PROZAC Take 40 mg by mouth daily.   furosemide 20 MG tablet Commonly known as:  LASIX Take 1 tablet (20 mg total) by mouth as needed for edema.   HYDROcodone-acetaminophen 7.5-325 MG tablet Commonly known as:  NORCO Take 1-2 tablets by mouth every 6 (six) hours as needed for moderate pain.   methocarbamol 500 MG tablet Commonly known as:  ROBAXIN Take 1 tablet (500 mg total) by mouth every 8 (eight) hours as needed for muscle spasms.   metoprolol 50 MG tablet Commonly known as:  LOPRESSOR Take 50 mg by mouth 2 (two) times daily.   multivitamin tablet Take 1 tablet by mouth. Take one tablet twice daily   polyethylene glycol packet Commonly known as:  MIRALAX / GLYCOLAX Take 17 g by mouth daily.   potassium chloride 10 MEQ tablet Commonly known as:  K-DUR Take 10 mEq by mouth. Take one tablet as needed when giving lasix as needed   senna-docusate 8.6-50 MG tablet Commonly known as:  Senokot-S Take 1 tablet by mouth at bedtime as needed for mild constipation.   sulfamethoxazole-trimethoprim 400-80 MG tablet Commonly known as:  BACTRIM Take 1 tablet by mouth 2 (two) times daily.   SYNTHROID 125 MCG tablet Generic drug:  levothyroxine TAKE 1 TABLET ONCE DAILY BEFORE BREAKFAST.   temazepam 30 MG capsule Commonly known as:  RESTORIL Take 1 capsule (30 mg total) by mouth at bedtime as needed for sleep.       Review of Systems  Constitutional: Negative for chills, diaphoresis and fever.  HENT: Positive for hearing loss. Negative for congestion, ear discharge, ear pain, nosebleeds, sore throat and tinnitus.   Eyes: Negative for photophobia, pain,  discharge and redness.  Respiratory: Negative for cough,  shortness of breath, wheezing and stridor.   Cardiovascular: Positive for leg swelling. Negative for chest pain and palpitations.       History PAF with RVR. 1+ edema RLE  Gastrointestinal: Positive for constipation. Negative for abdominal pain, blood in stool, diarrhea, nausea and vomiting.       Hx of GERD and hiatal hernia.  Endocrine: Negative for polydipsia.  Genitourinary: Negative for dysuria, flank pain, frequency, hematuria and urgency.  Musculoskeletal: Positive for arthralgias and gait problem. Negative for back pain, myalgias and neck pain.       Intertrochanteric fracture R femur 07/30/15. Subsequent open reduction internal fixation with screws by Dr. Carter Kitten. Left intertrochanteric fx. Repair by Dr. Erlinda Hong.  Skin: Negative for rash.       R+L knee surgical scars, right hip surgical incision intact  Allergic/Immunologic: Negative for environmental allergies.  Neurological: Negative for dizziness, tremors, seizures, weakness and headaches.  Hematological: Does not bruise/bleed easily.       Acute blood loss anemia following surgical repair of her right hip  Psychiatric/Behavioral: Negative for hallucinations and suicidal ideas. The patient is nervous/anxious.     Vitals:   02/03/17 1038  BP: 130/60  Pulse: 66  Resp: 18  Temp: 98.2 F (36.8 C)  Weight: 144 lb 3.2 oz (65.4 kg)  Height: 5\' 5"  (1.651 m)   Body mass index is 24 kg/m. Physical Exam  Constitutional: She is oriented to person, place, and time. She appears well-developed and well-nourished. No distress.  HENT:  Head: Normocephalic and atraumatic.  Right Ear: External ear normal.  Left Ear: External ear normal.  Nose: Nose normal.  Mouth/Throat: Oropharynx is clear and moist.  Eyes: Conjunctivae and EOM are normal. Pupils are equal, round, and reactive to light.  Corrective lenses  Neck: Normal range of motion. Neck supple. No JVD present. No tracheal deviation present. No thyromegaly present.    Cardiovascular: Normal rate, regular rhythm, normal heart sounds and intact distal pulses.   Pulmonary/Chest: Effort normal and breath sounds normal. No stridor. No respiratory distress. She has no wheezes. She has no rales. She exhibits no tenderness.  Abdominal: Bowel sounds are normal. She exhibits no distension and no mass. There is no tenderness. There is no rebound and no guarding. No hernia.  Genitourinary: Rectal exam shows guaiac negative stool. No vaginal discharge found.  Musculoskeletal: She exhibits edema (bilateral, L>R) and tenderness (left hip and knee).  Bandages at the left knee laterally and the left hip.   Lymphadenopathy:    She has no cervical adenopathy.  Neurological: She is alert and oriented to person, place, and time. She has normal reflexes. She displays normal reflexes. No cranial nerve deficit. She exhibits normal muscle tone. Coordination normal.  Skin: Skin is warm and dry. No rash noted. She is not diaphoretic. No erythema. No pallor.  Right hip surgical incision intact  Psychiatric: She has a normal mood and affect. Her behavior is normal. Judgment and thought content normal.  Insomnia, home med Temazepam nightly     Labs reviewed: Basic Metabolic Panel:  Recent Labs  01/27/17 0724 01/28/17 0508 01/29/17 0356 01/30/17 0311  NA 134* 133* 131* 133*  K 3.7 3.9 3.7 4.1  CL 98* 98* 97* 97*  CO2 26 28 28 29   GLUCOSE 122* 122* 119* 122*  BUN 20 26* 26* 21*  CREATININE 1.11* 1.41* 1.18* 0.82  CALCIUM 8.6* 8.9 8.6* 8.8*  MG 1.3* 1.7  --   --  Liver Function Tests:  Recent Labs  03/16/16 1028 09/12/16 1510  AST 19 23  ALT 19 29  ALKPHOS 141* 83  BILITOT 1.1 0.7  PROT 6.3* 5.9*  ALBUMIN 3.6 4.0   No results for input(s): LIPASE, AMYLASE in the last 8760 hours. No results for input(s): AMMONIA in the last 8760 hours. CBC:  Recent Labs  08/22/16 1456 09/12/16 1510 01/25/17 2130  01/28/17 1359 01/29/17 0356 01/29/17 1546 01/30/17 0311   WBC 7.3 6.8 15.3*  < > 13.2* 11.0*  --  10.1  NEUTROABS 4,307 3,332 12.9*  --   --   --   --   --   HGB 12.2 12.9 12.3  < > 7.7* 7.3* 8.8* 8.7*  HCT 36.8 38.2 36.7  < > 22.9* 21.3* 26.1* 25.3*  MCV 87.8 87.0 92.7  < > 93.1 92.6  --  89.1  PLT 239 250 229  < > 145* 139*  --  181  < > = values in this interval not displayed. Cardiac Enzymes: No results for input(s): CKTOTAL, CKMB, CKMBINDEX, TROPONINI in the last 8760 hours. BNP: Invalid input(s): POCBNP Lab Results  Component Value Date   HGBA1C 5.9 08/10/2015   Lab Results  Component Value Date   TSH 2.304 03/16/2016   Lab Results  Component Value Date   VITAMINB12 512 08/03/2015   Lab Results  Component Value Date   FOLATE 20.5 08/03/2015   Lab Results  Component Value Date   IRON 16 (L) 08/03/2015   TIBC 238 (L) 08/03/2015   FERRITIN 151 08/03/2015    Imaging and Procedures obtained prior to SNF admission: Dg Chest 2 View  Result Date: 01/25/2017 CLINICAL DATA:  Status post fall.  Congestion.  Initial encounter. EXAM: CHEST  2 VIEW COMPARISON:  Chest radiograph performed 03/16/2016 FINDINGS: The lungs are well-aerated. Minimal left basilar atelectasis is noted. There is no evidence of pleural effusion or pneumothorax. The heart is borderline enlarged. No acute osseous abnormalities are seen. Scattered vascular calcifications are seen. IMPRESSION: Borderline cardiomegaly.  Minimal left basilar atelectasis noted. Electronically Signed   By: Garald Balding M.D.   On: 01/25/2017 23:59   Ct Head Wo Contrast  Result Date: 01/25/2017 CLINICAL DATA:  Fall EXAM: CT HEAD WITHOUT CONTRAST CT CERVICAL SPINE WITHOUT CONTRAST TECHNIQUE: Multidetector CT imaging of the head and cervical spine was performed following the standard protocol without intravenous contrast. Multiplanar CT image reconstructions of the cervical spine were also generated. COMPARISON:  None. FINDINGS: CT HEAD FINDINGS Brain: No mass lesion, intraparenchymal  hemorrhage or extra-axial collection. No evidence of acute cortical infarct. Brain parenchyma and CSF-containing spaces are normal for age. Vascular: No hyperdense vessel or unexpected calcification. Skull: Normal visualized skull base, calvarium and extracranial soft tissues. Sinuses/Orbits: No sinus fluid levels or advanced mucosal thickening. No mastoid effusion. Normal orbits. CT CERVICAL SPINE FINDINGS Alignment: Grade 1 anterolisthesis at C2-C3. Facets are aligned. Occipital condyles are normally positioned. Skull base and vertebrae: No acute fracture. Soft tissues and spinal canal: No prevertebral fluid or swelling. No visible canal hematoma. Disc levels: Multilevel severe disc space loss with osteophyte formation and facet hypertrophy. Severe left C5-6 foraminal stenosis. No bony spinal canal stenosis. Upper chest: No pneumothorax, pulmonary nodule or pleural effusion. Other: Normal visualized paraspinal cervical soft tissues. IMPRESSION: 1. No acute intracranial abnormality. 2. No acute fracture of the cervical spine. 3. Marked cervical degenerative disc disease and facet hypertrophy. Severe left C5-6 foraminal stenosis. Electronically Signed   By: Ulyses Jarred  M.D.   On: 01/25/2017 22:31   Ct Cervical Spine Wo Contrast  Result Date: 01/25/2017 CLINICAL DATA:  Fall EXAM: CT HEAD WITHOUT CONTRAST CT CERVICAL SPINE WITHOUT CONTRAST TECHNIQUE: Multidetector CT imaging of the head and cervical spine was performed following the standard protocol without intravenous contrast. Multiplanar CT image reconstructions of the cervical spine were also generated. COMPARISON:  None. FINDINGS: CT HEAD FINDINGS Brain: No mass lesion, intraparenchymal hemorrhage or extra-axial collection. No evidence of acute cortical infarct. Brain parenchyma and CSF-containing spaces are normal for age. Vascular: No hyperdense vessel or unexpected calcification. Skull: Normal visualized skull base, calvarium and extracranial soft  tissues. Sinuses/Orbits: No sinus fluid levels or advanced mucosal thickening. No mastoid effusion. Normal orbits. CT CERVICAL SPINE FINDINGS Alignment: Grade 1 anterolisthesis at C2-C3. Facets are aligned. Occipital condyles are normally positioned. Skull base and vertebrae: No acute fracture. Soft tissues and spinal canal: No prevertebral fluid or swelling. No visible canal hematoma. Disc levels: Multilevel severe disc space loss with osteophyte formation and facet hypertrophy. Severe left C5-6 foraminal stenosis. No bony spinal canal stenosis. Upper chest: No pneumothorax, pulmonary nodule or pleural effusion. Other: Normal visualized paraspinal cervical soft tissues. IMPRESSION: 1. No acute intracranial abnormality. 2. No acute fracture of the cervical spine. 3. Marked cervical degenerative disc disease and facet hypertrophy. Severe left C5-6 foraminal stenosis. Electronically Signed   By: Ulyses Jarred M.D.   On: 01/25/2017 22:31   Dg C-arm 1-60 Min  Result Date: 01/26/2017 CLINICAL DATA:  ORIF left proximal femur fracture EXAM: LEFT FEMUR 2 VIEWS; DG C-ARM 61-120 MIN COMPARISON:  01/25/2017 left hip radiograph FINDINGS: Fluoroscopy time 1 minutes 30 seconds. Multiple nondiagnostic spot fluoroscopic intraoperative radiographs of the left femur demonstrate postsurgical changes from transfixation of the comminuted intertrochanteric proximal left femur fracture by intramedullary rod with interlocking left femoral neck screws and distal interlocking screw. Partially visualized left total knee arthroplasty. IMPRESSION: Intraoperative fluoroscopic guidance for ORIF left proximal femur fracture. Electronically Signed   By: Ilona Sorrel M.D.   On: 01/26/2017 16:45   Dg Hip Unilat W Or Wo Pelvis 2-3 Views Left  Result Date: 01/25/2017 CLINICAL DATA:  Status post fall, with generalized left hip pain. Initial encounter. EXAM: DG HIP (WITH OR WITHOUT PELVIS) 2-3V LEFT COMPARISON:  None. FINDINGS: There is a  comminuted left femoral intertrochanteric fracture, with shortening, and displacement of a lesser trochanteric fragment. The patient's right hip arthroplasty is grossly unremarkable in appearance, though incompletely characterized, without evidence of loosening. The left femoral head remains seated at the acetabulum, with minimal superior joint space narrowing noted. The sacroiliac joints are unremarkable in appearance. The visualized bowel gas pattern is grossly unremarkable in appearance. IMPRESSION: Comminuted left femoral intertrochanteric fracture, with shortening, and displacement of a lesser trochanteric fragment. Electronically Signed   By: Garald Balding M.D.   On: 01/25/2017 22:11   Dg Femur Min 2 Views Left  Result Date: 01/26/2017 CLINICAL DATA:  ORIF left proximal femur fracture EXAM: LEFT FEMUR 2 VIEWS; DG C-ARM 61-120 MIN COMPARISON:  01/25/2017 left hip radiograph FINDINGS: Fluoroscopy time 1 minutes 30 seconds. Multiple nondiagnostic spot fluoroscopic intraoperative radiographs of the left femur demonstrate postsurgical changes from transfixation of the comminuted intertrochanteric proximal left femur fracture by intramedullary rod with interlocking left femoral neck screws and distal interlocking screw. Partially visualized left total knee arthroplasty. IMPRESSION: Intraoperative fluoroscopic guidance for ORIF left proximal femur fracture. Electronically Signed   By: Ilona Sorrel M.D.   On: 01/26/2017 16:45  Assessment/Plan  1. Closed comminuted fracture of left hip, initial encounter ([place]) -PT and OT  2. Postoperative anemia due to acute blood loss -CBC  3. Paroxysmal atrial fibrillation (HCC) Controlled rate  4. Diabetes mellitus type 2, diet-controlled ([place]) controlled  5. Hyperlipidemia, unspecified hyperlipidemia type controlled  6. Essential hypertension controlled  7. Chronic anticoagulation Eliquis for the PAF  8. Bipolar affective disorder, current episode  hypomanic ([place]) In remission  9. Hyponatremia monitor lab  10. Hypothyroidism, unspecified type compensated

## 2017-02-09 ENCOUNTER — Non-Acute Institutional Stay (SKILLED_NURSING_FACILITY): Payer: Medicare Other | Admitting: Nurse Practitioner

## 2017-02-09 ENCOUNTER — Encounter: Payer: Self-pay | Admitting: Nurse Practitioner

## 2017-02-09 DIAGNOSIS — G4700 Insomnia, unspecified: Secondary | ICD-10-CM | POA: Diagnosis not present

## 2017-02-09 DIAGNOSIS — E119 Type 2 diabetes mellitus without complications: Secondary | ICD-10-CM | POA: Diagnosis not present

## 2017-02-09 DIAGNOSIS — M1990 Unspecified osteoarthritis, unspecified site: Secondary | ICD-10-CM | POA: Diagnosis not present

## 2017-02-09 DIAGNOSIS — F310 Bipolar disorder, current episode hypomanic: Secondary | ICD-10-CM | POA: Diagnosis not present

## 2017-02-09 DIAGNOSIS — I10 Essential (primary) hypertension: Secondary | ICD-10-CM | POA: Diagnosis not present

## 2017-02-09 DIAGNOSIS — I480 Paroxysmal atrial fibrillation: Secondary | ICD-10-CM

## 2017-02-09 DIAGNOSIS — K5900 Constipation, unspecified: Secondary | ICD-10-CM

## 2017-02-09 DIAGNOSIS — E039 Hypothyroidism, unspecified: Secondary | ICD-10-CM

## 2017-02-09 DIAGNOSIS — D72829 Elevated white blood cell count, unspecified: Secondary | ICD-10-CM

## 2017-02-09 DIAGNOSIS — M810 Age-related osteoporosis without current pathological fracture: Secondary | ICD-10-CM

## 2017-02-09 DIAGNOSIS — E871 Hypo-osmolality and hyponatremia: Secondary | ICD-10-CM | POA: Diagnosis not present

## 2017-02-09 NOTE — Assessment & Plan Note (Signed)
Diet controlled.  

## 2017-02-09 NOTE — Assessment & Plan Note (Signed)
Heart rate is in controlled, continue Diltiazem 180mg, Metoprolol 50mg bid, Elliquis 2.5mg bid.   

## 2017-02-09 NOTE — Assessment & Plan Note (Signed)
02/07/17 LDL 46, Na 134, K 4.3, Bun 11, creat 0.82, TSH 14.41, wbc 10.4, Hgb 8.4, plte 486 02/09/17 increase Synthroid to 150mcg, TSH 12 weeks.   

## 2017-02-09 NOTE — Assessment & Plan Note (Signed)
Resolved

## 2017-02-09 NOTE — Assessment & Plan Note (Signed)
Last Na 134 02/07/17

## 2017-02-09 NOTE — Assessment & Plan Note (Signed)
Stable, continue MiraLax daily, Senna S I hs prn.  

## 2017-02-09 NOTE — Assessment & Plan Note (Signed)
Continue Temazepam 

## 2017-02-09 NOTE — Assessment & Plan Note (Signed)
Recent left hip surgery, healing nicely, prn Norco 7.5/325mg prn available  

## 2017-02-09 NOTE — Assessment & Plan Note (Signed)
Controlled, continue combination of Diltiazem 180mg, Metoprolol 50mg bid, Furosemide 20mg prn  

## 2017-02-09 NOTE — Progress Notes (Signed)
Location:  [facility] Room Number: 32 Place of Service:  SNF (31) Provider:  Glyn Zendejas, [place]  NP  Jeanmarie Hubert, MD  Patient Care Team: Estill Dooms, MD as PCP - General (Internal Medicine) Earlie Server, MD as Consulting Physician (Orthopedic Surgery) Lorretta Harp, MD as Consulting Physician (Cardiology) Calvert Cantor, MD as Consulting Physician (Ophthalmology) [name], MD as Attending Physician [facility])  Extended Emergency Contact Information Primary Emergency Contact: Ponciano Ort Address: [address]          [place], [place] [address] Johnnette Litter of [place] Phone: [phone] Relation: Son Secondary Emergency Contact: [place], [place] [address] Johnnette Litter of [place] Phone: [phone] Relation: Son  Code Status:  Full Code Goals of care: Advanced Directive information Advanced Directives 02/09/2017  Does Patient Have a Medical Advance Directive? Yes  Type of Advance Directive [place]  Does patient want to make changes to medical advance directive? No - Patient declined  Copy of [place] in Chart? Yes  Would patient like information on creating a medical advance directive? -  Pre-existing out of facility DNR order (yellow form or pink MOST form) -     Chief Complaint  Patient presents with  . Acute Visit    Depression    HPI:  Pt is a 83 y.o. female seen today for an acute visit for depression, 02/09/17 psych dc Prozac, start Cymbalta 60mg , observe. Hypothyroidism, TSH 14.41, taking Levothyroxine 165mcg.   Hx of left hip surgical repair 01/25/17, healing nicely, the left leg mild edema and pain, managed with prn Norco and Robaxin. Afib, heart rate is in control, taking Diltiazem 180mg , Eliquis. HTN/edema  taking Furosemide 20mg  prn, Metoprolol 50mg  bid, taking ASA and Atorvastatin daily. Osteoporosis, taking Prolia and Calcium.    Past  Medical History:  Diagnosis Date  . Anxiety attack    "take RX prn" (08/13/2013)  . Arthritis    "probably" (08/13/2013)  . Atrial fibrillation ([place])    new onset/pt report 08/13/2013  . Bronchitis 12/2011  . Complication of anesthesia   . Depression   . Diabetes mellitus type 2, diet-controlled ([place])   . GERD (gastroesophageal reflux disease)   . H/O hiatal hernia   . Hardware complicating wound infection ([place]) 08/22/2016  . History of blood transfusion    "w/both knee OR's" (08/13/2013)  . Hyperlipidemia   . Hyperlipidemia   . Hypertension   . Hypothyroidism    takes synthroid  . Insomnia   . Intertrochanteric fracture of right hip ([place]) 07/30/2015  . Neuromuscular disorder ([place])    carpal tunnel in right hand  . Periprosthetic fracture around internal prosthetic right hip joint ([place]), nonunion basicervical femoral neck with broken hardware and subtrochanteric fracture 03/16/2016  . PONV (postoperative nausea and vomiting)   . 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 Proteus infection 08/22/2016  . Serratia infection 11/14/2016  . Skin cancer of face 2000's   "one on each side of my face and one on my nose" (08/13/2013)  . Swelling of extremity    sees Dr. Gwenlyn Found, cardiologist 1 x year [phone]  . Tendon dysfunction 08/22/2016   Past Surgical History:  Procedure Laterality Date  . 2D ECHOCARDIOGRAM  12/07/2004   EF 48%, moderate pulmonary hypertension  . BREAST BIOPSY Left ?1970's  . CARDIOVASCULAR STRESS TEST  12/07/11  . CARDIOVASCULAR STRESS TEST  12/07/2011   LEXISCAN,  normal scan, no significant wall abnormalities noted  . CARPAL TUNNEL RELEASE Right ~ 2005  . CATARACT EXTRACTION, BILATERAL Bilateral 2000  . DILATION AND CURETTAGE OF UTERUS  1950's  . DOPPLER ECHOCARDIOGRAPHY  12/07/11  . EYE SURGERY    . FEMUR IM NAIL Right 08/01/2015   Procedure: INTRAMEDULLARY (IM) NAIL FEMORAL;  Surgeon: Marchia Bond, MD;  Location: [facility];  Service: Orthopedics;  Laterality: Right;    . HARDWARE REMOVAL Right 03/16/2016   Procedure: HARDWARE REMOVAL;  Surgeon: Marchia Bond, MD;  Location: [facility];  Service: Orthopedics;  Laterality: Right;  . INTRAMEDULLARY (IM) NAIL INTERTROCHANTERIC Left 01/26/2017   Procedure: INTRAMEDULLARY (IM) NAIL INTERTROCHANTRIC;  Surgeon: Leandrew Koyanagi, MD;  Location: [facility];  Service: Orthopedics;  Laterality: Left;  . JOINT REPLACEMENT     bilateral knee replacement  . SKIN CANCER EXCISION  2000's   "off my nose" (08/13/2013)  . TOTAL HIP ARTHROPLASTY Right 03/16/2016   Procedure: TOTAL HIP ARTHROPLASTY;  Surgeon: Marchia Bond, MD;  Location: [facility];  Service: Orthopedics;  Laterality: Right;  . TOTAL KNEE ARTHROPLASTY Right 2005  . TOTAL KNEE ARTHROPLASTY  03/02/2012   Procedure: TOTAL KNEE ARTHROPLASTY;  Surgeon: Yvette Rack., MD;  Location: [facility];  Service: Orthopedics;  Laterality: Left;    Allergies  Allergen Reactions  . Levaquin [Levofloxacin] Other (See Comments)    tendinopathy  . Morphine And Related Other (See Comments)    Made pt "wild"    Allergies as of 02/09/2017      Reactions   Levaquin [levofloxacin] Other (See Comments)   tendinopathy   Morphine And Related Other (See Comments)   Made pt "wild"      Medication List       Accurate as of 02/09/17 11:59 PM. Always use your most recent med list.          acetaminophen 325 MG tablet Commonly known as:  TYLENOL Take 650 mg by mouth every 6 (six) hours as needed for moderate pain.   aspirin EC 81 MG tablet Take 81 mg by mouth daily.   atorvastatin 80 MG tablet Commonly known as:  LIPITOR Take 80 mg by mouth daily.   calcium carbonate 1250 (500 Ca) MG tablet Commonly known as:  OS-CAL - dosed in mg of elemental calcium Take 1 tablet by mouth every evening.   Cholecalciferol 2000 units Caps Take 2,000 Units by mouth every evening.   denosumab 60 MG/ML Soln injection Commonly known as:  PROLIA Inject 60 mg into the skin every 6 (six) months. Administer in upper  arm, thigh, or abdomen   diltiazem 180 MG 24 hr capsule Commonly known as:  CARDIZEM CD Take 1 capsule (180 mg total) by mouth daily.   ELIQUIS 2.5 MG Tabs tablet Generic drug:  apixaban TAKE 1 TABLET TWICE DAILY.   ferrous sulfate 325 (65 FE) MG tablet Take 1 tablet (325 mg total) by mouth 3 (three) times daily with meals.   Flaxseed Oil 1000 MG Caps Take 1,000 mg by mouth. Take one capsule twice a day   FLUoxetine 40 MG capsule Commonly known as:  PROZAC Take 40 mg by mouth daily.   furosemide 20 MG tablet Commonly known as:  LASIX Take 1 tablet (20 mg total) by mouth as needed for edema.   HYDROcodone-acetaminophen 7.5-325 MG tablet Commonly known as:  NORCO Take 1-2 tablets by mouth every 6 (six) hours as needed for moderate pain.   methocarbamol 500 MG tablet Commonly known  as:  ROBAXIN Take 1 tablet (500 mg total) by mouth every 8 (eight) hours as needed for muscle spasms.   metoprolol 50 MG tablet Commonly known as:  LOPRESSOR Take 50 mg by mouth 2 (two) times daily.   multivitamin tablet Take 1 tablet by mouth. Take one tablet twice daily   polyethylene glycol packet Commonly known as:  MIRALAX / GLYCOLAX Take 17 g by mouth daily.   potassium chloride 10 MEQ tablet Commonly known as:  K-DUR Take 10 mEq by mouth. Take one tablet as needed when giving lasix as needed   senna-docusate 8.6-50 MG tablet Commonly known as:  Senokot-S Take 1 tablet by mouth at bedtime as needed for mild constipation.   sulfamethoxazole-trimethoprim 400-80 MG tablet Commonly known as:  BACTRIM Take 1 tablet by mouth 2 (two) times daily.   SYNTHROID 125 MCG tablet Generic drug:  levothyroxine TAKE 1 TABLET ONCE DAILY BEFORE BREAKFAST.   temazepam 30 MG capsule Commonly known as:  RESTORIL Take 1 capsule (30 mg total) by mouth at bedtime as needed for sleep.       Review of Systems  Constitutional: Negative for chills, diaphoresis and fever.  HENT: Positive for  hearing loss. Negative for congestion, ear discharge, ear pain, nosebleeds, sore throat and tinnitus.   Eyes: Negative for photophobia, pain, discharge and redness.  Respiratory: Negative for cough, shortness of breath, wheezing and stridor.   Cardiovascular: Positive for leg swelling. Negative for chest pain and palpitations.       History PAF with RVR. 1+ edema LLE  Gastrointestinal: Positive for constipation. Negative for abdominal pain, blood in stool, diarrhea, nausea and vomiting.       Hx of GERD and hiatal hernia.  Endocrine: Negative for polydipsia.  Genitourinary: Negative for dysuria, flank pain, frequency, hematuria and urgency.  Musculoskeletal: Positive for arthralgias and gait problem. Negative for back pain, myalgias and neck pain.       Intertrochanteric fracture R femur 07/30/15. Subsequent open reduction internal fixation with screws by Dr. Carter Kitten. Left intertrochanteric fx. Repair by Dr. Erlinda Hong.  Skin: Negative for rash.       R+L knee surgical scars, left hip surgical incision intact  Allergic/Immunologic: Negative for environmental allergies.  Neurological: Negative for dizziness, tremors, seizures, weakness and headaches.  Hematological: Does not bruise/bleed easily.       Acute blood loss anemia following surgical repair of her right hip  Psychiatric/Behavioral: Negative for hallucinations and suicidal ideas. The patient is nervous/anxious.     Immunization History  Administered Date(s) Administered  . Influenza Whole 03/05/2014  . Influenza,inj,Quad PF,36+ Mos 08/22/2016   Pertinent  Health Maintenance Due  Topic Date Due  . FOOT EXAM  09/10/1932  . OPHTHALMOLOGY EXAM  09/10/1932  . URINE MICROALBUMIN  09/10/1934  . DEXA SCAN  09/10/1989  . PNA vac Low Risk Adult (1 of 2 - PCV13) 09/10/1989  . HEMOGLOBIN A1C  02/09/2016  . INFLUENZA VACCINE  Completed   Fall Risk  11/14/2016 08/22/2016 08/06/2015  Falls in the past year? No No Yes  Number falls in past yr:  - - 1  Injury with Fall? - - Yes  Risk for fall due to : Impaired mobility Impaired mobility -  Follow up - - Falls evaluation completed   Functional Status Survey:    Vitals:   02/09/17 1431  BP: 126/60  Pulse: 62  Resp: 18  Temp: 97.9 F (36.6 C)  Weight: 144 lb 3.2 oz (65.4 kg)  Height:  5\' 3"  (1.6 m)   Body mass index is 25.54 kg/m. Physical Exam  Constitutional: She is oriented to person, place, and time. She appears well-developed and well-nourished. No distress.  HENT:  Head: Normocephalic and atraumatic.  Right Ear: External ear normal.  Left Ear: External ear normal.  Nose: Nose normal.  Mouth/Throat: Oropharynx is clear and moist.  Eyes: Conjunctivae and EOM are normal. Pupils are equal, round, and reactive to light.  Corrective lenses  Neck: Normal range of motion. Neck supple. No JVD present. No tracheal deviation present. No thyromegaly present.  Cardiovascular: Normal rate, regular rhythm, normal heart sounds and intact distal pulses.   Pulmonary/Chest: Effort normal and breath sounds normal. No stridor. No respiratory distress. She has no wheezes. She has no rales. She exhibits no tenderness.  Abdominal: Bowel sounds are normal. She exhibits no distension and no mass. There is no tenderness. There is no rebound and no guarding. No hernia.  Genitourinary: Rectal exam shows guaiac negative stool. No vaginal discharge found.  Musculoskeletal: She exhibits edema (bilateral, L>R) and tenderness (left hip and knee).  Bandages at the left knee laterally and the left hip.   Lymphadenopathy:    She has no cervical adenopathy.  Neurological: She is alert and oriented to person, place, and time. She has normal reflexes. She displays normal reflexes. No cranial nerve deficit. She exhibits normal muscle tone. Coordination normal.  Skin: Skin is warm and dry. No rash noted. She is not diaphoretic. No erythema. No pallor.  Left hip surgical incision intact  Psychiatric: She  has a normal mood and affect. Her behavior is normal. Judgment and thought content normal.  Insomnia, home med Temazepam nightly     Labs reviewed:  Recent Labs  01/27/17 0724 01/28/17 0508 01/29/17 0356 01/30/17 0311  NA 134* 133* 131* 133*  K 3.7 3.9 3.7 4.1  CL 98* 98* 97* 97*  CO2 26 28 28 29   GLUCOSE 122* 122* 119* 122*  BUN 20 26* 26* 21*  CREATININE 1.11* 1.41* 1.18* 0.82  CALCIUM 8.6* 8.9 8.6* 8.8*  MG 1.3* 1.7  --   --     Recent Labs  03/16/16 1028 09/12/16 1510  AST 19 23  ALT 19 29  ALKPHOS 141* 83  BILITOT 1.1 0.7  PROT 6.3* 5.9*  ALBUMIN 3.6 4.0    Recent Labs  08/22/16 1456 09/12/16 1510 01/25/17 2130  01/28/17 1359 01/29/17 0356 01/29/17 1546 01/30/17 0311  WBC 7.3 6.8 15.3*  < > 13.2* 11.0*  --  10.1  NEUTROABS 4,307 3,332 12.9*  --   --   --   --   --   HGB 12.2 12.9 12.3  < > 7.7* 7.3* 8.8* 8.7*  HCT 36.8 38.2 36.7  < > 22.9* 21.3* 26.1* 25.3*  MCV 87.8 87.0 92.7  < > 93.1 92.6  --  89.1  PLT 239 250 229  < > 145* 139*  --  181  < > = values in this interval not displayed. Lab Results  Component Value Date   TSH 2.304 03/16/2016   Lab Results  Component Value Date   HGBA1C 5.9 08/10/2015   Lab Results  Component Value Date   CHOL 116 04/26/2013   HDL 45 04/26/2013   LDLCALC 60 04/26/2013   TRIG 56 04/26/2013   CHOLHDL 2.6 04/26/2013    Significant Diagnostic Results in last 30 days:  Dg Chest 2 View  Result Date: 01/25/2017 CLINICAL DATA:  Status post fall.  Congestion.  Initial encounter. EXAM: CHEST  2 VIEW COMPARISON:  Chest radiograph performed 03/16/2016 FINDINGS: The lungs are well-aerated. Minimal left basilar atelectasis is noted. There is no evidence of pleural effusion or pneumothorax. The heart is borderline enlarged. No acute osseous abnormalities are seen. Scattered vascular calcifications are seen. IMPRESSION: Borderline cardiomegaly.  Minimal left basilar atelectasis noted. Electronically Signed   By: Garald Balding M.D.   On: 01/25/2017 23:59   Ct Head Wo Contrast  Result Date: 01/25/2017 CLINICAL DATA:  Fall EXAM: CT HEAD WITHOUT CONTRAST CT CERVICAL SPINE WITHOUT CONTRAST TECHNIQUE: Multidetector CT imaging of the head and cervical spine was performed following the standard protocol without intravenous contrast. Multiplanar CT image reconstructions of the cervical spine were also generated. COMPARISON:  None. FINDINGS: CT HEAD FINDINGS Brain: No mass lesion, intraparenchymal hemorrhage or extra-axial collection. No evidence of acute cortical infarct. Brain parenchyma and CSF-containing spaces are normal for age. Vascular: No hyperdense vessel or unexpected calcification. Skull: Normal visualized skull base, calvarium and extracranial soft tissues. Sinuses/Orbits: No sinus fluid levels or advanced mucosal thickening. No mastoid effusion. Normal orbits. CT CERVICAL SPINE FINDINGS Alignment: Grade 1 anterolisthesis at C2-C3. Facets are aligned. Occipital condyles are normally positioned. Skull base and vertebrae: No acute fracture. Soft tissues and spinal canal: No prevertebral fluid or swelling. No visible canal hematoma. Disc levels: Multilevel severe disc space loss with osteophyte formation and facet hypertrophy. Severe left C5-6 foraminal stenosis. No bony spinal canal stenosis. Upper chest: No pneumothorax, pulmonary nodule or pleural effusion. Other: Normal visualized paraspinal cervical soft tissues. IMPRESSION: 1. No acute intracranial abnormality. 2. No acute fracture of the cervical spine. 3. Marked cervical degenerative disc disease and facet hypertrophy. Severe left C5-6 foraminal stenosis. Electronically Signed   By: Ulyses Jarred M.D.   On: 01/25/2017 22:31   Ct Cervical Spine Wo Contrast  Result Date: 01/25/2017 CLINICAL DATA:  Fall EXAM: CT HEAD WITHOUT CONTRAST CT CERVICAL SPINE WITHOUT CONTRAST TECHNIQUE: Multidetector CT imaging of the head and cervical spine was performed following the  standard protocol without intravenous contrast. Multiplanar CT image reconstructions of the cervical spine were also generated. COMPARISON:  None. FINDINGS: CT HEAD FINDINGS Brain: No mass lesion, intraparenchymal hemorrhage or extra-axial collection. No evidence of acute cortical infarct. Brain parenchyma and CSF-containing spaces are normal for age. Vascular: No hyperdense vessel or unexpected calcification. Skull: Normal visualized skull base, calvarium and extracranial soft tissues. Sinuses/Orbits: No sinus fluid levels or advanced mucosal thickening. No mastoid effusion. Normal orbits. CT CERVICAL SPINE FINDINGS Alignment: Grade 1 anterolisthesis at C2-C3. Facets are aligned. Occipital condyles are normally positioned. Skull base and vertebrae: No acute fracture. Soft tissues and spinal canal: No prevertebral fluid or swelling. No visible canal hematoma. Disc levels: Multilevel severe disc space loss with osteophyte formation and facet hypertrophy. Severe left C5-6 foraminal stenosis. No bony spinal canal stenosis. Upper chest: No pneumothorax, pulmonary nodule or pleural effusion. Other: Normal visualized paraspinal cervical soft tissues. IMPRESSION: 1. No acute intracranial abnormality. 2. No acute fracture of the cervical spine. 3. Marked cervical degenerative disc disease and facet hypertrophy. Severe left C5-6 foraminal stenosis. Electronically Signed   By: Ulyses Jarred M.D.   On: 01/25/2017 22:31   Dg C-arm 1-60 Min  Result Date: 01/26/2017 CLINICAL DATA:  ORIF left proximal femur fracture EXAM: LEFT FEMUR 2 VIEWS; DG C-ARM 61-120 MIN COMPARISON:  01/25/2017 left hip radiograph FINDINGS: Fluoroscopy time 1 minutes 30 seconds. Multiple nondiagnostic spot fluoroscopic intraoperative radiographs of the left femur demonstrate postsurgical changes  from transfixation of the comminuted intertrochanteric proximal left femur fracture by intramedullary rod with interlocking left femoral neck screws and distal  interlocking screw. Partially visualized left total knee arthroplasty. IMPRESSION: Intraoperative fluoroscopic guidance for ORIF left proximal femur fracture. Electronically Signed   By: Ilona Sorrel M.D.   On: 01/26/2017 16:45   Dg Hip Unilat W Or Wo Pelvis 2-3 Views Left  Result Date: 01/25/2017 CLINICAL DATA:  Status post fall, with generalized left hip pain. Initial encounter. EXAM: DG HIP (WITH OR WITHOUT PELVIS) 2-3V LEFT COMPARISON:  None. FINDINGS: There is a comminuted left femoral intertrochanteric fracture, with shortening, and displacement of a lesser trochanteric fragment. The patient's right hip arthroplasty is grossly unremarkable in appearance, though incompletely characterized, without evidence of loosening. The left femoral head remains seated at the acetabulum, with minimal superior joint space narrowing noted. The sacroiliac joints are unremarkable in appearance. The visualized bowel gas pattern is grossly unremarkable in appearance. IMPRESSION: Comminuted left femoral intertrochanteric fracture, with shortening, and displacement of a lesser trochanteric fragment. Electronically Signed   By: Garald Balding M.D.   On: 01/25/2017 22:11   Dg Femur Min 2 Views Left  Result Date: 01/26/2017 CLINICAL DATA:  ORIF left proximal femur fracture EXAM: LEFT FEMUR 2 VIEWS; DG C-ARM 61-120 MIN COMPARISON:  01/25/2017 left hip radiograph FINDINGS: Fluoroscopy time 1 minutes 30 seconds. Multiple nondiagnostic spot fluoroscopic intraoperative radiographs of the left femur demonstrate postsurgical changes from transfixation of the comminuted intertrochanteric proximal left femur fracture by intramedullary rod with interlocking left femoral neck screws and distal interlocking screw. Partially visualized left total knee arthroplasty. IMPRESSION: Intraoperative fluoroscopic guidance for ORIF left proximal femur fracture. Electronically Signed   By: Ilona Sorrel M.D.   On: 01/26/2017 16:45     Assessment/Plan Bipolar disorder ([place]) 08/04/15 SLUMS 28/30 02/09/17 psych dc Prozac, start Cymbalta 60mg , observe.    Hypertension Controlled, continue combination of Diltiazem 180mg , Metoprolol 50mg  bid, Furosemide 20mg  prn   Paroxysmal atrial fibrillation ([facility]) Heart rate is in controlled, continue Diltiazem 180mg , Metoprolol 50mg  bid, Elliquis 2.5mg  bid.    Constipation Stable, continue MiraLax daily, Senna S I hs prn.   Hypothyroidism 02/07/17 LDL 46, Na 134, K 4.3, Bun 11, creat 0.82, TSH 14.41, wbc 10.4, Hgb 8.4, plte 486 02/09/17 increase Synthroid to 124mcg, TSH 12 weeks.    Diabetes mellitus type 2, diet-controlled ([place]) Diet controlled.   Arthritis Recent left hip surgery, healing nicely, prn Norco 7.5/325mg  prn available   Insomnia Continue Temazepam  Hyponatremia Last Na 134 02/07/17  Leukocytosis Resolved.   OP (osteoporosis) Currently taking Prolia and Calcium     Family/ staff Communication: IL when able.   Labs/tests ordered:  TSH 12 weeks.

## 2017-02-09 NOTE — Assessment & Plan Note (Signed)
08/04/15 SLUMS 28/30 02/09/17 psych dc Prozac, start Cymbalta 60mg , observe.

## 2017-02-12 NOTE — Assessment & Plan Note (Signed)
Currently taking Prolia and Calcium

## 2017-02-13 ENCOUNTER — Ambulatory Visit (INDEPENDENT_AMBULATORY_CARE_PROVIDER_SITE_OTHER): Payer: Medicare Other | Admitting: Orthopaedic Surgery

## 2017-02-13 ENCOUNTER — Encounter (INDEPENDENT_AMBULATORY_CARE_PROVIDER_SITE_OTHER): Payer: Self-pay | Admitting: Orthopaedic Surgery

## 2017-02-13 DIAGNOSIS — S72092D Other fracture of head and neck of left femur, subsequent encounter for closed fracture with routine healing: Secondary | ICD-10-CM

## 2017-02-13 NOTE — Progress Notes (Signed)
She is 2 weeks status post IM nailing left hip fracture. She is doing well. She is taking Eliquis at baseline. She mainly complains of some lateral knee pain over the interlocking screw. Her hip range of motion is nice and without pain. She is walking with a walker. The staples are removed today. I'll like to see her back in 4 weeks with 2 view x-rays of the left hip

## 2017-02-23 ENCOUNTER — Encounter: Payer: Self-pay | Admitting: Nurse Practitioner

## 2017-02-23 ENCOUNTER — Non-Acute Institutional Stay (SKILLED_NURSING_FACILITY): Payer: Medicare Other | Admitting: Nurse Practitioner

## 2017-02-23 DIAGNOSIS — Z7901 Long term (current) use of anticoagulants: Secondary | ICD-10-CM | POA: Diagnosis not present

## 2017-02-23 DIAGNOSIS — R58 Hemorrhage, not elsewhere classified: Secondary | ICD-10-CM | POA: Diagnosis not present

## 2017-02-23 DIAGNOSIS — G4700 Insomnia, unspecified: Secondary | ICD-10-CM | POA: Diagnosis not present

## 2017-02-23 DIAGNOSIS — D72829 Elevated white blood cell count, unspecified: Secondary | ICD-10-CM

## 2017-02-23 DIAGNOSIS — K5900 Constipation, unspecified: Secondary | ICD-10-CM | POA: Diagnosis not present

## 2017-02-23 DIAGNOSIS — D62 Acute posthemorrhagic anemia: Secondary | ICD-10-CM | POA: Diagnosis not present

## 2017-02-23 DIAGNOSIS — E119 Type 2 diabetes mellitus without complications: Secondary | ICD-10-CM

## 2017-02-23 DIAGNOSIS — F310 Bipolar disorder, current episode hypomanic: Secondary | ICD-10-CM

## 2017-02-23 DIAGNOSIS — I10 Essential (primary) hypertension: Secondary | ICD-10-CM

## 2017-02-23 DIAGNOSIS — M1990 Unspecified osteoarthritis, unspecified site: Secondary | ICD-10-CM

## 2017-02-23 DIAGNOSIS — I480 Paroxysmal atrial fibrillation: Secondary | ICD-10-CM

## 2017-02-23 DIAGNOSIS — E039 Hypothyroidism, unspecified: Secondary | ICD-10-CM | POA: Diagnosis not present

## 2017-02-23 DIAGNOSIS — E871 Hypo-osmolality and hyponatremia: Secondary | ICD-10-CM | POA: Diagnosis not present

## 2017-02-23 NOTE — Assessment & Plan Note (Signed)
Recent left hip surgery, healing nicely, prn Norco 7.5/325mg  prn available

## 2017-02-23 NOTE — Assessment & Plan Note (Signed)
02/07/17 Na 134

## 2017-02-23 NOTE — Assessment & Plan Note (Signed)
ASA and Eliquis for Afib.

## 2017-02-23 NOTE — Assessment & Plan Note (Addendum)
Hgb 8.4 02/07/17, continue Fe, update CBC

## 2017-02-23 NOTE — Assessment & Plan Note (Signed)
Continue Temazepam 

## 2017-02-23 NOTE — Assessment & Plan Note (Signed)
Diet controlled.  

## 2017-02-23 NOTE — Assessment & Plan Note (Signed)
Heart rate is in controlled, continue Diltiazem 180mg , Metoprolol 50mg  bid, Elliquis 2.5mg  bid.

## 2017-02-23 NOTE — Assessment & Plan Note (Signed)
02/07/17 LDL 46, Na 134, K 4.3, Bun 11, creat 0.82, TSH 14.41, wbc 10.4, Hgb 8.4, plte 486 02/09/17 increase Synthroid to 124mcg, TSH 12 weeks.

## 2017-02-23 NOTE — Progress Notes (Signed)
Location:  [facility] Room Number: 54 Place of Service:  SNF (31) Provider:  Hogan Hoobler, Manxie  NP  Jeanmarie Hubert, MD  Patient Care Team: Estill Dooms, MD as PCP - General (Internal Medicine) Earlie Server, MD as Consulting Physician (Orthopedic Surgery) Lorretta Harp, MD as Consulting Physician (Cardiology) Calvert Cantor, MD as Consulting Physician (Ophthalmology) [name], MD as Attending Physician [facility] Medicine)  Extended Emergency Contact Information Primary Emergency Contact: Ponciano Ort Address: [address]          [place], [place] [address] Johnnette Litter of [place] Phone: [phone] Relation: Son Secondary Emergency Contact: [place], [place] [address] Johnnette Litter of [place] Phone: [phone] Relation: Son  Code Status: Full Code Goals of care: Advanced Directive information Advanced Directives 02/23/2017  Does Patient Have a Medical Advance Directive? Yes  Type of Advance Directive [place]  Does patient want to make changes to medical advance directive? No - Patient declined  Copy of [place] in Chart? Yes  Would patient like information on creating a medical advance directive? -  Pre-existing out of facility DNR order (yellow form or pink MOST form) -     Chief Complaint  Patient presents with  . Acute Visit    puffy brused area inner side of (L) leg.    HPI:  Pt is a 83 y.o. female seen today for an acute visit for Medial left thigh ecchymosis/puffiness so as the left hip surgical site, no s/s of cellulitis, taking ASA Eliquis, update CBC CMP, still able to ambulate with walker  Depression, POA declined 02/09/17 psych dc Prozac, start Cymbalta 60mg , observe. Hypothyroidism, TSH 14.41, taking Levothyroxine 157mcg. Hx of left hip surgical repair 01/25/17, healing nicely, the left leg mild edema and pain, managed with prn Norco and Robaxin. Afib,  heart rate is in control, taking Diltiazem 180mg , Eliquis. HTN/edema  taking Furosemide 20mg  prn, Metoprolol 50mg  bid, taking ASA and Atorvastatin daily. Osteoporosis, taking Prolia and Calcium.   Past Medical History:  Diagnosis Date  . Anxiety attack    "take RX prn" (08/13/2013)  . Arthritis    "probably" (08/13/2013)  . Atrial fibrillation ([place])    new onset/pt report 08/13/2013  . Bronchitis 12/2011  . Complication of anesthesia   . Depression   . Diabetes mellitus type 2, diet-controlled ([place])   . GERD (gastroesophageal reflux disease)   . H/O hiatal hernia   . Hardware complicating wound infection ([place]) 08/22/2016  . History of blood transfusion    "w/both knee OR's" (08/13/2013)  . Hyperlipidemia   . Hyperlipidemia   . Hypertension   . Hypothyroidism    takes synthroid  . Insomnia   . Intertrochanteric fracture of right hip ([place]) 07/30/2015  . Neuromuscular disorder ([place])    carpal tunnel in right hand  . Periprosthetic fracture around internal prosthetic right hip joint ([place]), nonunion basicervical femoral neck with broken hardware and subtrochanteric fracture 03/16/2016  . PONV (postoperative nausea and vomiting)   . 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 Proteus infection 08/22/2016  . Serratia infection 11/14/2016  . Skin cancer of face 2000's   "one on each side of my face and one on my nose" (08/13/2013)  . Swelling of extremity    sees Dr. Gwenlyn Found, cardiologist 1 x year [phone]  . Tendon dysfunction 08/22/2016   Past Surgical History:  Procedure Laterality Date  . 2D  ECHOCARDIOGRAM  12/07/2004   EF 48%, moderate pulmonary hypertension  . BREAST BIOPSY Left ?1970's  . CARDIOVASCULAR STRESS TEST  12/07/11  . CARDIOVASCULAR STRESS TEST  12/07/2011   LEXISCAN, normal scan, no significant wall abnormalities noted  . CARPAL TUNNEL RELEASE Right ~ 2005  . CATARACT EXTRACTION, BILATERAL Bilateral 2000  . DILATION AND CURETTAGE OF UTERUS  1950's  . DOPPLER  ECHOCARDIOGRAPHY  12/07/11  . EYE SURGERY    . FEMUR IM NAIL Right 08/01/2015   Procedure: INTRAMEDULLARY (IM) NAIL FEMORAL;  Surgeon: Marchia Bond, MD;  Location: [facility];  Service: Orthopedics;  Laterality: Right;  . HARDWARE REMOVAL Right 03/16/2016   Procedure: HARDWARE REMOVAL;  Surgeon: Marchia Bond, MD;  Location: [facility];  Service: Orthopedics;  Laterality: Right;  . INTRAMEDULLARY (IM) NAIL INTERTROCHANTERIC Left 01/26/2017   Procedure: INTRAMEDULLARY (IM) NAIL INTERTROCHANTRIC;  Surgeon: Leandrew Koyanagi, MD;  Location: [facility];  Service: Orthopedics;  Laterality: Left;  . JOINT REPLACEMENT     bilateral knee replacement  . SKIN CANCER EXCISION  2000's   "off my nose" (08/13/2013)  . TOTAL HIP ARTHROPLASTY Right 03/16/2016   Procedure: TOTAL HIP ARTHROPLASTY;  Surgeon: Marchia Bond, MD;  Location: [facility];  Service: Orthopedics;  Laterality: Right;  . TOTAL KNEE ARTHROPLASTY Right 2005  . TOTAL KNEE ARTHROPLASTY  03/02/2012   Procedure: TOTAL KNEE ARTHROPLASTY;  Surgeon: Yvette Rack., MD;  Location: [facility];  Service: Orthopedics;  Laterality: Left;    Allergies  Allergen Reactions  . Levaquin [Levofloxacin] Other (See Comments)    tendinopathy  . Morphine And Related Other (See Comments)    Made pt "wild"    Allergies as of 02/23/2017      Reactions   Levaquin [levofloxacin] Other (See Comments)   tendinopathy   Morphine And Related Other (See Comments)   Made pt "wild"      Medication List       Accurate as of 02/23/17  3:50 PM. Always use your most recent med list.          acetaminophen 325 MG tablet Commonly known as:  TYLENOL Take 650 mg by mouth every 6 (six) hours as needed for moderate pain.   aspirin EC 81 MG tablet Take 81 mg by mouth daily.   atorvastatin 80 MG tablet Commonly known as:  LIPITOR Take 80 mg by mouth daily.   calcium carbonate 1250 (500 Ca) MG tablet Commonly known as:  OS-CAL - dosed in mg of elemental calcium Take 1 tablet by mouth every  evening.   Cholecalciferol 2000 units Caps Take 2,000 Units by mouth every evening.   denosumab 60 MG/ML Soln injection Commonly known as:  PROLIA Inject 60 mg into the skin every 6 (six) months. Administer in upper arm, thigh, or abdomen   diltiazem 180 MG 24 hr capsule Commonly known as:  CARDIZEM CD Take 1 capsule (180 mg total) by mouth daily.   DULoxetine 30 MG capsule Commonly known as:  CYMBALTA Take 30 mg by mouth daily.   ELIQUIS 2.5 MG Tabs tablet Generic drug:  apixaban TAKE 1 TABLET TWICE DAILY.   esomeprazole 20 MG capsule Commonly known as:  NEXIUM Take 20 mg by mouth daily at 12 noon.   ferrous sulfate 325 (65 FE) MG tablet Take 1 tablet (325 mg total) by mouth 3 (three) times daily with meals.   Flaxseed Oil 1000 MG Caps Take 1,000 mg by mouth. Take one capsule twice a day   furosemide  20 MG tablet Commonly known as:  LASIX Take 1 tablet (20 mg total) by mouth as needed for edema.   HYDROcodone-acetaminophen 7.5-325 MG tablet Commonly known as:  NORCO Take 1-2 tablets by mouth every 6 (six) hours as needed for moderate pain.   levothyroxine 150 MCG tablet Commonly known as:  SYNTHROID, LEVOTHROID Take 150 mcg by mouth daily before breakfast.   methocarbamol 500 MG tablet Commonly known as:  ROBAXIN Take 1 tablet (500 mg total) by mouth every 8 (eight) hours as needed for muscle spasms.   metoprolol 50 MG tablet Commonly known as:  LOPRESSOR Take 50 mg by mouth 2 (two) times daily.   multivitamin tablet Take 1 tablet by mouth. Take one tablet twice daily   polyethylene glycol packet Commonly known as:  MIRALAX / GLYCOLAX Take 17 g by mouth daily.   potassium chloride 10 MEQ tablet Commonly known as:  K-DUR Take 10 mEq by mouth. Take one tablet as needed when giving lasix as needed   senna-docusate 8.6-50 MG tablet Commonly known as:  Senokot-S Take 1 tablet by mouth at bedtime as needed for mild constipation.     sulfamethoxazole-trimethoprim 400-80 MG tablet Commonly known as:  BACTRIM Take 1 tablet by mouth 2 (two) times daily.   temazepam 30 MG capsule Commonly known as:  RESTORIL Take 1 capsule (30 mg total) by mouth at bedtime as needed for sleep.       Review of Systems  Constitutional: Negative for chills, diaphoresis and fever.  HENT: Positive for hearing loss. Negative for congestion, ear discharge, ear pain, nosebleeds, sore throat and tinnitus.   Eyes: Negative for photophobia, pain, discharge and redness.  Respiratory: Negative for cough, shortness of breath, wheezing and stridor.   Cardiovascular: Positive for leg swelling. Negative for chest pain and palpitations.       History PAF with RVR. 1+ edema LLE  Gastrointestinal: Positive for constipation. Negative for abdominal pain, blood in stool, diarrhea, nausea and vomiting.       Hx of GERD and hiatal hernia.  Endocrine: Negative for polydipsia.  Genitourinary: Negative for dysuria, flank pain, frequency, hematuria and urgency.  Musculoskeletal: Positive for arthralgias and gait problem. Negative for back pain, myalgias and neck pain.       Intertrochanteric fracture R femur 07/30/15. Subsequent open reduction internal fixation with screws by Dr. Carter Kitten. Left intertrochanteric fx. Repair by Dr. Erlinda Hong.  Skin: Negative for rash.       R+L knee surgical scars, left hip surgical incision healed. Medial left thigh ecchymosis/puffiness,  Allergic/Immunologic: Negative for environmental allergies.  Neurological: Negative for dizziness, tremors, seizures, weakness and headaches.  Hematological: Does not bruise/bleed easily.       Acute blood loss anemia following surgical repair of her right hip  Psychiatric/Behavioral: Negative for hallucinations and suicidal ideas. The patient is nervous/anxious.     Immunization History  Administered Date(s) Administered  . Influenza Whole 03/05/2014  . Influenza,inj,Quad PF,36+ Mos 08/22/2016    Pertinent  Health Maintenance Due  Topic Date Due  . FOOT EXAM  09/10/1932  . OPHTHALMOLOGY EXAM  09/10/1932  . URINE MICROALBUMIN  09/10/1934  . DEXA SCAN  09/10/1989  . PNA vac Low Risk Adult (1 of 2 - PCV13) 09/10/1989  . HEMOGLOBIN A1C  02/09/2016  . INFLUENZA VACCINE  Completed   Fall Risk  11/14/2016 08/22/2016 08/06/2015  Falls in the past year? No No Yes  Number falls in past yr: - - 1  Injury with Fall? - -  Yes  Risk for fall due to : Impaired mobility Impaired mobility -  Follow up - - Falls evaluation completed   Functional Status Survey:    Vitals:   02/23/17 1336  BP: (!) 111/55  Pulse: 63  Resp: 20  Temp: 98 F (36.7 C)  Weight: 138 lb 12.8 oz (63 kg)  Height: 5\' 3"  (1.6 m)   Body mass index is 24.59 kg/m. Physical Exam  Constitutional: She is oriented to person, place, and time. She appears well-developed and well-nourished. No distress.  HENT:  Head: Normocephalic and atraumatic.  Right Ear: External ear normal.  Left Ear: External ear normal.  Nose: Nose normal.  Mouth/Throat: Oropharynx is clear and moist.  Eyes: Conjunctivae and EOM are normal. Pupils are equal, round, and reactive to light.  Corrective lenses  Neck: Normal range of motion. Neck supple. No JVD present. No tracheal deviation present. No thyromegaly present.  Cardiovascular: Normal rate, regular rhythm, normal heart sounds and intact distal pulses.   Pulmonary/Chest: Effort normal and breath sounds normal. No stridor. No respiratory distress. She has no wheezes. She has no rales. She exhibits no tenderness.  Abdominal: Bowel sounds are normal. She exhibits no distension and no mass. There is no tenderness. There is no rebound and no guarding. No hernia.  Genitourinary: Rectal exam shows guaiac negative stool. No vaginal discharge found.  Musculoskeletal: She exhibits edema (bilateral, L>R) and tenderness (left hip and knee).  Bandages at the left knee laterally and the left hip.    Lymphadenopathy:    She has no cervical adenopathy.  Neurological: She is alert and oriented to person, place, and time. She has normal reflexes. She displays normal reflexes. No cranial nerve deficit. She exhibits normal muscle tone. Coordination normal.  Skin: Skin is warm and dry. No rash noted. She is not diaphoretic. No erythema. No pallor.  Left hip surgical incision healed. Medial left thigh ecchymosis/puffiness,  Psychiatric: She has a normal mood and affect. Her behavior is normal. Judgment and thought content normal.  Insomnia, home med Temazepam nightly     Labs reviewed:  Recent Labs  01/27/17 0724 01/28/17 0508 01/29/17 0356 01/30/17 0311  NA 134* 133* 131* 133*  K 3.7 3.9 3.7 4.1  CL 98* 98* 97* 97*  CO2 26 28 28 29   GLUCOSE 122* 122* 119* 122*  BUN 20 26* 26* 21*  CREATININE 1.11* 1.41* 1.18* 0.82  CALCIUM 8.6* 8.9 8.6* 8.8*  MG 1.3* 1.7  --   --     Recent Labs  03/16/16 1028 09/12/16 1510  AST 19 23  ALT 19 29  ALKPHOS 141* 83  BILITOT 1.1 0.7  PROT 6.3* 5.9*  ALBUMIN 3.6 4.0    Recent Labs  08/22/16 1456 09/12/16 1510 01/25/17 2130  01/28/17 1359 01/29/17 0356 01/29/17 1546 01/30/17 0311  WBC 7.3 6.8 15.3*  < > 13.2* 11.0*  --  10.1  NEUTROABS 4,307 3,332 12.9*  --   --   --   --   --   HGB 12.2 12.9 12.3  < > 7.7* 7.3* 8.8* 8.7*  HCT 36.8 38.2 36.7  < > 22.9* 21.3* 26.1* 25.3*  MCV 87.8 87.0 92.7  < > 93.1 92.6  --  89.1  PLT 239 250 229  < > 145* 139*  --  181  < > = values in this interval not displayed. Lab Results  Component Value Date   TSH 2.304 03/16/2016   Lab Results  Component Value Date  HGBA1C 5.9 08/10/2015   Lab Results  Component Value Date   CHOL 116 04/26/2013   HDL 45 04/26/2013   LDLCALC 60 04/26/2013   TRIG 56 04/26/2013   CHOLHDL 2.6 04/26/2013    Significant Diagnostic Results in last 30 days:  Dg Chest 2 View  Result Date: 01/25/2017 CLINICAL DATA:  Status post fall.  Congestion.  Initial  encounter. EXAM: CHEST  2 VIEW COMPARISON:  Chest radiograph performed 03/16/2016 FINDINGS: The lungs are well-aerated. Minimal left basilar atelectasis is noted. There is no evidence of pleural effusion or pneumothorax. The heart is borderline enlarged. No acute osseous abnormalities are seen. Scattered vascular calcifications are seen. IMPRESSION: Borderline cardiomegaly.  Minimal left basilar atelectasis noted. Electronically Signed   By: Garald Balding M.D.   On: 01/25/2017 23:59   Ct Head Wo Contrast  Result Date: 01/25/2017 CLINICAL DATA:  Fall EXAM: CT HEAD WITHOUT CONTRAST CT CERVICAL SPINE WITHOUT CONTRAST TECHNIQUE: Multidetector CT imaging of the head and cervical spine was performed following the standard protocol without intravenous contrast. Multiplanar CT image reconstructions of the cervical spine were also generated. COMPARISON:  None. FINDINGS: CT HEAD FINDINGS Brain: No mass lesion, intraparenchymal hemorrhage or extra-axial collection. No evidence of acute cortical infarct. Brain parenchyma and CSF-containing spaces are normal for age. Vascular: No hyperdense vessel or unexpected calcification. Skull: Normal visualized skull base, calvarium and extracranial soft tissues. Sinuses/Orbits: No sinus fluid levels or advanced mucosal thickening. No mastoid effusion. Normal orbits. CT CERVICAL SPINE FINDINGS Alignment: Grade 1 anterolisthesis at C2-C3. Facets are aligned. Occipital condyles are normally positioned. Skull base and vertebrae: No acute fracture. Soft tissues and spinal canal: No prevertebral fluid or swelling. No visible canal hematoma. Disc levels: Multilevel severe disc space loss with osteophyte formation and facet hypertrophy. Severe left C5-6 foraminal stenosis. No bony spinal canal stenosis. Upper chest: No pneumothorax, pulmonary nodule or pleural effusion. Other: Normal visualized paraspinal cervical soft tissues. IMPRESSION: 1. No acute intracranial abnormality. 2. No acute  fracture of the cervical spine. 3. Marked cervical degenerative disc disease and facet hypertrophy. Severe left C5-6 foraminal stenosis. Electronically Signed   By: Ulyses Jarred M.D.   On: 01/25/2017 22:31   Ct Cervical Spine Wo Contrast  Result Date: 01/25/2017 CLINICAL DATA:  Fall EXAM: CT HEAD WITHOUT CONTRAST CT CERVICAL SPINE WITHOUT CONTRAST TECHNIQUE: Multidetector CT imaging of the head and cervical spine was performed following the standard protocol without intravenous contrast. Multiplanar CT image reconstructions of the cervical spine were also generated. COMPARISON:  None. FINDINGS: CT HEAD FINDINGS Brain: No mass lesion, intraparenchymal hemorrhage or extra-axial collection. No evidence of acute cortical infarct. Brain parenchyma and CSF-containing spaces are normal for age. Vascular: No hyperdense vessel or unexpected calcification. Skull: Normal visualized skull base, calvarium and extracranial soft tissues. Sinuses/Orbits: No sinus fluid levels or advanced mucosal thickening. No mastoid effusion. Normal orbits. CT CERVICAL SPINE FINDINGS Alignment: Grade 1 anterolisthesis at C2-C3. Facets are aligned. Occipital condyles are normally positioned. Skull base and vertebrae: No acute fracture. Soft tissues and spinal canal: No prevertebral fluid or swelling. No visible canal hematoma. Disc levels: Multilevel severe disc space loss with osteophyte formation and facet hypertrophy. Severe left C5-6 foraminal stenosis. No bony spinal canal stenosis. Upper chest: No pneumothorax, pulmonary nodule or pleural effusion. Other: Normal visualized paraspinal cervical soft tissues. IMPRESSION: 1. No acute intracranial abnormality. 2. No acute fracture of the cervical spine. 3. Marked cervical degenerative disc disease and facet hypertrophy. Severe left C5-6 foraminal stenosis. Electronically Signed  By: Ulyses Jarred M.D.   On: 01/25/2017 22:31   Dg C-arm 1-60 Min  Result Date: 01/26/2017 CLINICAL DATA:   ORIF left proximal femur fracture EXAM: LEFT FEMUR 2 VIEWS; DG C-ARM 61-120 MIN COMPARISON:  01/25/2017 left hip radiograph FINDINGS: Fluoroscopy time 1 minutes 30 seconds. Multiple nondiagnostic spot fluoroscopic intraoperative radiographs of the left femur demonstrate postsurgical changes from transfixation of the comminuted intertrochanteric proximal left femur fracture by intramedullary rod with interlocking left femoral neck screws and distal interlocking screw. Partially visualized left total knee arthroplasty. IMPRESSION: Intraoperative fluoroscopic guidance for ORIF left proximal femur fracture. Electronically Signed   By: Ilona Sorrel M.D.   On: 01/26/2017 16:45   Dg Hip Unilat W Or Wo Pelvis 2-3 Views Left  Result Date: 01/25/2017 CLINICAL DATA:  Status post fall, with generalized left hip pain. Initial encounter. EXAM: DG HIP (WITH OR WITHOUT PELVIS) 2-3V LEFT COMPARISON:  None. FINDINGS: There is a comminuted left femoral intertrochanteric fracture, with shortening, and displacement of a lesser trochanteric fragment. The patient's right hip arthroplasty is grossly unremarkable in appearance, though incompletely characterized, without evidence of loosening. The left femoral head remains seated at the acetabulum, with minimal superior joint space narrowing noted. The sacroiliac joints are unremarkable in appearance. The visualized bowel gas pattern is grossly unremarkable in appearance. IMPRESSION: Comminuted left femoral intertrochanteric fracture, with shortening, and displacement of a lesser trochanteric fragment. Electronically Signed   By: Garald Balding M.D.   On: 01/25/2017 22:11   Dg Femur Min 2 Views Left  Result Date: 01/26/2017 CLINICAL DATA:  ORIF left proximal femur fracture EXAM: LEFT FEMUR 2 VIEWS; DG C-ARM 61-120 MIN COMPARISON:  01/25/2017 left hip radiograph FINDINGS: Fluoroscopy time 1 minutes 30 seconds. Multiple nondiagnostic spot fluoroscopic intraoperative radiographs of the  left femur demonstrate postsurgical changes from transfixation of the comminuted intertrochanteric proximal left femur fracture by intramedullary rod with interlocking left femoral neck screws and distal interlocking screw. Partially visualized left total knee arthroplasty. IMPRESSION: Intraoperative fluoroscopic guidance for ORIF left proximal femur fracture. Electronically Signed   By: Ilona Sorrel M.D.   On: 01/26/2017 16:45    Assessment/Plan Ecchymosis Medial left thigh from the left hip surgery, taking ASA Eliquis, update CBC CMP, still able to ambulate with walker  Hypertension Controlled, continue combination of Diltiazem 180mg , Metoprolol 50mg  bid, Furosemide 20mg  prn   Paroxysmal atrial fibrillation (HCC) Heart rate is in controlled, continue Diltiazem 180mg , Metoprolol 50mg  bid, Elliquis 2.5mg  bid.    Constipation Stable, continue MiraLax daily, Senna S I hs prn.   Hypothyroidism 02/07/17 LDL 46, Na 134, K 4.3, Bun 11, creat 0.82, TSH 14.41, wbc 10.4, Hgb 8.4, plte 486 02/09/17 increase Synthroid to 155mcg, TSH 12 weeks.    Diabetes mellitus type 2, diet-controlled ([place]) Diet controlled.   Arthritis Recent left hip surgery, healing nicely, prn Norco 7.5/325mg  prn available   Insomnia Continue Temazepam  Postoperative anemia due to acute blood loss Hgb 8.4 02/07/17, continue Fe, update CBC  Bipolar disorder ([place]) 08/04/15 SLUMS 28/30 02/09/17 psych dc Prozac, start Cymbalta 60mg , observe.  02/13/17 continue Prozac-POA request  Hyponatremia 02/07/17 Na 134  Chronic anticoagulation ASA and Eliquis for Afib.   Leukocytosis Resolved. 02/07/17 wbc 10.4    Family/ staff Communication: [place] or [place] when able.   Labs/tests ordered: CBC CMP

## 2017-02-23 NOTE — Assessment & Plan Note (Signed)
08/04/15 SLUMS 28/30 02/09/17 psych dc Prozac, start Cymbalta 60mg , observe.  02/13/17 continue Prozac-POA request

## 2017-02-23 NOTE — Assessment & Plan Note (Addendum)
Resolved. 02/07/17 wbc 10.4

## 2017-02-23 NOTE — Assessment & Plan Note (Addendum)
Medial left thigh from the left hip surgery, taking ASA Eliquis, update CBC CMP, still able to ambulate with walker

## 2017-02-23 NOTE — Assessment & Plan Note (Signed)
Controlled, continue combination of Diltiazem 180mg , Metoprolol 50mg  bid, Furosemide 20mg  prn

## 2017-02-23 NOTE — Assessment & Plan Note (Signed)
Stable, continue MiraLax daily, Senna S I hs prn.

## 2017-02-24 ENCOUNTER — Ambulatory Visit (INDEPENDENT_AMBULATORY_CARE_PROVIDER_SITE_OTHER): Payer: Medicare Other | Admitting: Orthopaedic Surgery

## 2017-02-24 ENCOUNTER — Telehealth (INDEPENDENT_AMBULATORY_CARE_PROVIDER_SITE_OTHER): Payer: Self-pay | Admitting: Orthopaedic Surgery

## 2017-02-24 ENCOUNTER — Ambulatory Visit (INDEPENDENT_AMBULATORY_CARE_PROVIDER_SITE_OTHER): Payer: No Typology Code available for payment source

## 2017-02-24 ENCOUNTER — Encounter (INDEPENDENT_AMBULATORY_CARE_PROVIDER_SITE_OTHER): Payer: Self-pay | Admitting: Orthopaedic Surgery

## 2017-02-24 DIAGNOSIS — S72092D Other fracture of head and neck of left femur, subsequent encounter for closed fracture with routine healing: Secondary | ICD-10-CM | POA: Diagnosis not present

## 2017-02-24 NOTE — Progress Notes (Signed)
Patient is 4 weeks status post IM nailing left intertrochanteric fracture. She just comes in for some medial thigh swelling. This is getting better and becoming less painful. She wants it checked out. Physical exam shows a small area swelling on the medial thigh with surrounding ecchymosis. There is no aggressive features. X-ray show stable fixation of the fracture. No complications. I will recommend warm compresses to the area. Reassurances given. Follow-up in 6 weeks with repeat 2 view x-rays of the left hip.

## 2017-02-24 NOTE — Telephone Encounter (Signed)
[facility] IS CALLING FOR CLARIFICATION ON DR. Phoebe Sharps ORDERS FOR TODAY. PARTICULARLY HOW MANY TIMES A DAY FOR THE WARM COMPRESS.   [phone] [facility]

## 2017-02-28 LAB — HEPATIC FUNCTION PANEL
ALK PHOS: 109 U/L (ref 25–125)
ALT: 9 U/L (ref 7–35)
AST: 12 U/L — AB (ref 13–35)
Bilirubin, Total: 0.6 mg/dL

## 2017-02-28 LAB — BASIC METABOLIC PANEL
BUN: 11 mg/dL (ref 4–21)
Creatinine: 0.9 mg/dL (ref <=1.1)
Glucose: 84 mg/dL
Potassium: 4.1 mmol/L (ref 3.4–5.3)
SODIUM: 138 mmol/L (ref 137–147)

## 2017-02-28 NOTE — Telephone Encounter (Signed)
Please advise on how many times. See message below.

## 2017-02-28 NOTE — Telephone Encounter (Signed)
TID

## 2017-03-01 ENCOUNTER — Other Ambulatory Visit: Payer: Self-pay | Admitting: Nurse Practitioner

## 2017-03-01 ENCOUNTER — Other Ambulatory Visit: Payer: Self-pay | Admitting: *Deleted

## 2017-03-01 NOTE — Telephone Encounter (Signed)
LMOM TID

## 2017-03-02 ENCOUNTER — Telehealth: Payer: Self-pay | Admitting: Cardiovascular Disease

## 2017-03-02 DIAGNOSIS — M21751 Unequal limb length (acquired), right femur: Secondary | ICD-10-CM | POA: Diagnosis not present

## 2017-03-02 DIAGNOSIS — Z471 Aftercare following joint replacement surgery: Secondary | ICD-10-CM | POA: Diagnosis not present

## 2017-03-02 DIAGNOSIS — M25551 Pain in right hip: Secondary | ICD-10-CM | POA: Diagnosis not present

## 2017-03-02 DIAGNOSIS — M6281 Muscle weakness (generalized): Secondary | ICD-10-CM | POA: Diagnosis not present

## 2017-03-02 DIAGNOSIS — Z4789 Encounter for other orthopedic aftercare: Secondary | ICD-10-CM | POA: Diagnosis not present

## 2017-03-02 DIAGNOSIS — M722 Plantar fascial fibromatosis: Secondary | ICD-10-CM | POA: Diagnosis not present

## 2017-03-02 DIAGNOSIS — R2681 Unsteadiness on feet: Secondary | ICD-10-CM | POA: Diagnosis not present

## 2017-03-02 DIAGNOSIS — M5431 Sciatica, right side: Secondary | ICD-10-CM | POA: Diagnosis not present

## 2017-03-03 DIAGNOSIS — M5431 Sciatica, right side: Secondary | ICD-10-CM | POA: Diagnosis not present

## 2017-03-03 DIAGNOSIS — Z471 Aftercare following joint replacement surgery: Secondary | ICD-10-CM | POA: Diagnosis not present

## 2017-03-03 DIAGNOSIS — M25551 Pain in right hip: Secondary | ICD-10-CM | POA: Diagnosis not present

## 2017-03-03 DIAGNOSIS — M6281 Muscle weakness (generalized): Secondary | ICD-10-CM | POA: Diagnosis not present

## 2017-03-03 DIAGNOSIS — R2681 Unsteadiness on feet: Secondary | ICD-10-CM | POA: Diagnosis not present

## 2017-03-03 DIAGNOSIS — M722 Plantar fascial fibromatosis: Secondary | ICD-10-CM | POA: Diagnosis not present

## 2017-03-06 DIAGNOSIS — Z471 Aftercare following joint replacement surgery: Secondary | ICD-10-CM | POA: Diagnosis not present

## 2017-03-06 DIAGNOSIS — M6281 Muscle weakness (generalized): Secondary | ICD-10-CM | POA: Diagnosis not present

## 2017-03-06 DIAGNOSIS — M5431 Sciatica, right side: Secondary | ICD-10-CM | POA: Diagnosis not present

## 2017-03-06 DIAGNOSIS — R2681 Unsteadiness on feet: Secondary | ICD-10-CM | POA: Diagnosis not present

## 2017-03-06 DIAGNOSIS — M25551 Pain in right hip: Secondary | ICD-10-CM | POA: Diagnosis not present

## 2017-03-06 DIAGNOSIS — M722 Plantar fascial fibromatosis: Secondary | ICD-10-CM | POA: Diagnosis not present

## 2017-03-08 DIAGNOSIS — M6281 Muscle weakness (generalized): Secondary | ICD-10-CM | POA: Diagnosis not present

## 2017-03-08 DIAGNOSIS — Z471 Aftercare following joint replacement surgery: Secondary | ICD-10-CM | POA: Diagnosis not present

## 2017-03-08 DIAGNOSIS — M5431 Sciatica, right side: Secondary | ICD-10-CM | POA: Diagnosis not present

## 2017-03-08 DIAGNOSIS — M25551 Pain in right hip: Secondary | ICD-10-CM | POA: Diagnosis not present

## 2017-03-08 DIAGNOSIS — R2681 Unsteadiness on feet: Secondary | ICD-10-CM | POA: Diagnosis not present

## 2017-03-08 DIAGNOSIS — M722 Plantar fascial fibromatosis: Secondary | ICD-10-CM | POA: Diagnosis not present

## 2017-03-10 DIAGNOSIS — M6281 Muscle weakness (generalized): Secondary | ICD-10-CM | POA: Diagnosis not present

## 2017-03-10 DIAGNOSIS — M5431 Sciatica, right side: Secondary | ICD-10-CM | POA: Diagnosis not present

## 2017-03-10 DIAGNOSIS — M25551 Pain in right hip: Secondary | ICD-10-CM | POA: Diagnosis not present

## 2017-03-10 DIAGNOSIS — Z471 Aftercare following joint replacement surgery: Secondary | ICD-10-CM | POA: Diagnosis not present

## 2017-03-10 DIAGNOSIS — M722 Plantar fascial fibromatosis: Secondary | ICD-10-CM | POA: Diagnosis not present

## 2017-03-10 DIAGNOSIS — R2681 Unsteadiness on feet: Secondary | ICD-10-CM | POA: Diagnosis not present

## 2017-03-13 DIAGNOSIS — Z4789 Encounter for other orthopedic aftercare: Secondary | ICD-10-CM | POA: Diagnosis not present

## 2017-03-13 DIAGNOSIS — Z471 Aftercare following joint replacement surgery: Secondary | ICD-10-CM | POA: Diagnosis not present

## 2017-03-13 DIAGNOSIS — M6281 Muscle weakness (generalized): Secondary | ICD-10-CM | POA: Diagnosis not present

## 2017-03-13 DIAGNOSIS — M21751 Unequal limb length (acquired), right femur: Secondary | ICD-10-CM | POA: Diagnosis not present

## 2017-03-13 DIAGNOSIS — M722 Plantar fascial fibromatosis: Secondary | ICD-10-CM | POA: Diagnosis not present

## 2017-03-13 DIAGNOSIS — R2681 Unsteadiness on feet: Secondary | ICD-10-CM | POA: Diagnosis not present

## 2017-03-13 DIAGNOSIS — M5431 Sciatica, right side: Secondary | ICD-10-CM | POA: Diagnosis not present

## 2017-03-13 DIAGNOSIS — R41841 Cognitive communication deficit: Secondary | ICD-10-CM | POA: Diagnosis not present

## 2017-03-15 DIAGNOSIS — Z471 Aftercare following joint replacement surgery: Secondary | ICD-10-CM | POA: Diagnosis not present

## 2017-03-15 DIAGNOSIS — R41841 Cognitive communication deficit: Secondary | ICD-10-CM | POA: Diagnosis not present

## 2017-03-15 DIAGNOSIS — M5431 Sciatica, right side: Secondary | ICD-10-CM | POA: Diagnosis not present

## 2017-03-15 DIAGNOSIS — M722 Plantar fascial fibromatosis: Secondary | ICD-10-CM | POA: Diagnosis not present

## 2017-03-15 DIAGNOSIS — M6281 Muscle weakness (generalized): Secondary | ICD-10-CM | POA: Diagnosis not present

## 2017-03-15 DIAGNOSIS — R2681 Unsteadiness on feet: Secondary | ICD-10-CM | POA: Diagnosis not present

## 2017-03-17 DIAGNOSIS — M6281 Muscle weakness (generalized): Secondary | ICD-10-CM | POA: Diagnosis not present

## 2017-03-17 DIAGNOSIS — M5431 Sciatica, right side: Secondary | ICD-10-CM | POA: Diagnosis not present

## 2017-03-17 DIAGNOSIS — Z471 Aftercare following joint replacement surgery: Secondary | ICD-10-CM | POA: Diagnosis not present

## 2017-03-17 DIAGNOSIS — M722 Plantar fascial fibromatosis: Secondary | ICD-10-CM | POA: Diagnosis not present

## 2017-03-17 DIAGNOSIS — R2681 Unsteadiness on feet: Secondary | ICD-10-CM | POA: Diagnosis not present

## 2017-03-17 DIAGNOSIS — R41841 Cognitive communication deficit: Secondary | ICD-10-CM | POA: Diagnosis not present

## 2017-03-20 ENCOUNTER — Ambulatory Visit (INDEPENDENT_AMBULATORY_CARE_PROVIDER_SITE_OTHER): Payer: Medicare Other | Admitting: Orthopaedic Surgery

## 2017-03-20 DIAGNOSIS — M722 Plantar fascial fibromatosis: Secondary | ICD-10-CM | POA: Diagnosis not present

## 2017-03-20 DIAGNOSIS — M6281 Muscle weakness (generalized): Secondary | ICD-10-CM | POA: Diagnosis not present

## 2017-03-20 DIAGNOSIS — M5431 Sciatica, right side: Secondary | ICD-10-CM | POA: Diagnosis not present

## 2017-03-20 DIAGNOSIS — R2681 Unsteadiness on feet: Secondary | ICD-10-CM | POA: Diagnosis not present

## 2017-03-20 DIAGNOSIS — Z471 Aftercare following joint replacement surgery: Secondary | ICD-10-CM | POA: Diagnosis not present

## 2017-03-20 DIAGNOSIS — R41841 Cognitive communication deficit: Secondary | ICD-10-CM | POA: Diagnosis not present

## 2017-03-22 DIAGNOSIS — M722 Plantar fascial fibromatosis: Secondary | ICD-10-CM | POA: Diagnosis not present

## 2017-03-22 DIAGNOSIS — M5431 Sciatica, right side: Secondary | ICD-10-CM | POA: Diagnosis not present

## 2017-03-22 DIAGNOSIS — R41841 Cognitive communication deficit: Secondary | ICD-10-CM | POA: Diagnosis not present

## 2017-03-22 DIAGNOSIS — M6281 Muscle weakness (generalized): Secondary | ICD-10-CM | POA: Diagnosis not present

## 2017-03-22 DIAGNOSIS — R2681 Unsteadiness on feet: Secondary | ICD-10-CM | POA: Diagnosis not present

## 2017-03-22 DIAGNOSIS — Z471 Aftercare following joint replacement surgery: Secondary | ICD-10-CM | POA: Diagnosis not present

## 2017-03-24 DIAGNOSIS — M6281 Muscle weakness (generalized): Secondary | ICD-10-CM | POA: Diagnosis not present

## 2017-03-24 DIAGNOSIS — M722 Plantar fascial fibromatosis: Secondary | ICD-10-CM | POA: Diagnosis not present

## 2017-03-24 DIAGNOSIS — R2681 Unsteadiness on feet: Secondary | ICD-10-CM | POA: Diagnosis not present

## 2017-03-24 DIAGNOSIS — Z471 Aftercare following joint replacement surgery: Secondary | ICD-10-CM | POA: Diagnosis not present

## 2017-03-24 DIAGNOSIS — R41841 Cognitive communication deficit: Secondary | ICD-10-CM | POA: Diagnosis not present

## 2017-03-24 DIAGNOSIS — M5431 Sciatica, right side: Secondary | ICD-10-CM | POA: Diagnosis not present

## 2017-03-27 ENCOUNTER — Ambulatory Visit (INDEPENDENT_AMBULATORY_CARE_PROVIDER_SITE_OTHER): Payer: Medicare Other | Admitting: Infectious Disease

## 2017-03-27 ENCOUNTER — Encounter: Payer: Self-pay | Admitting: Infectious Disease

## 2017-03-27 VITALS — BP 182/84 | HR 59 | Temp 97.6°F | Wt 134.0 lb

## 2017-03-27 DIAGNOSIS — M170 Bilateral primary osteoarthritis of knee: Secondary | ICD-10-CM | POA: Diagnosis not present

## 2017-03-27 DIAGNOSIS — M6281 Muscle weakness (generalized): Secondary | ICD-10-CM | POA: Diagnosis not present

## 2017-03-27 DIAGNOSIS — Z96649 Presence of unspecified artificial hip joint: Secondary | ICD-10-CM

## 2017-03-27 DIAGNOSIS — T8459XD Infection and inflammatory reaction due to other internal joint prosthesis, subsequent encounter: Secondary | ICD-10-CM | POA: Diagnosis not present

## 2017-03-27 DIAGNOSIS — Z967 Presence of other bone and tendon implants: Secondary | ICD-10-CM

## 2017-03-27 DIAGNOSIS — R41841 Cognitive communication deficit: Secondary | ICD-10-CM | POA: Diagnosis not present

## 2017-03-27 DIAGNOSIS — M722 Plantar fascial fibromatosis: Secondary | ICD-10-CM | POA: Diagnosis not present

## 2017-03-27 DIAGNOSIS — Z471 Aftercare following joint replacement surgery: Secondary | ICD-10-CM | POA: Diagnosis not present

## 2017-03-27 DIAGNOSIS — Z8781 Personal history of (healed) traumatic fracture: Secondary | ICD-10-CM

## 2017-03-27 DIAGNOSIS — A498 Other bacterial infections of unspecified site: Secondary | ICD-10-CM | POA: Diagnosis not present

## 2017-03-27 DIAGNOSIS — M5431 Sciatica, right side: Secondary | ICD-10-CM | POA: Diagnosis not present

## 2017-03-27 DIAGNOSIS — R2681 Unsteadiness on feet: Secondary | ICD-10-CM | POA: Diagnosis not present

## 2017-03-27 DIAGNOSIS — Z9889 Other specified postprocedural states: Secondary | ICD-10-CM

## 2017-03-27 LAB — CBC WITH DIFFERENTIAL/PLATELET
BASOS PCT: 0 %
Basophils Absolute: 0 cells/uL (ref 0–200)
EOS ABS: 272 {cells}/uL (ref 15–500)
Eosinophils Relative: 4 %
HEMATOCRIT: 42.7 % (ref 35.0–45.0)
HEMOGLOBIN: 13.8 g/dL (ref 11.7–15.5)
LYMPHS ABS: 2176 {cells}/uL (ref 850–3900)
LYMPHS PCT: 32 %
MCH: 31.6 pg (ref 27.0–33.0)
MCHC: 32.3 g/dL (ref 32.0–36.0)
MCV: 97.7 fL (ref 80.0–100.0)
MONO ABS: 612 {cells}/uL (ref 200–950)
MPV: 10 fL (ref 7.5–12.5)
Monocytes Relative: 9 %
NEUTROS PCT: 55 %
Neutro Abs: 3740 cells/uL (ref 1500–7800)
Platelets: 220 10*3/uL (ref 140–400)
RBC: 4.37 MIL/uL (ref 3.80–5.10)
RDW: 15 % (ref 11.0–15.0)
WBC: 6.8 10*3/uL (ref 3.8–10.8)

## 2017-03-27 LAB — BASIC METABOLIC PANEL WITH GFR
BUN: 13 mg/dL (ref 7–25)
CHLORIDE: 103 mmol/L (ref 98–110)
CO2: 30 mmol/L (ref 20–31)
CREATININE: 0.7 mg/dL (ref 0.60–0.88)
Calcium: 10.1 mg/dL (ref 8.6–10.4)
GFR, Est African American: 87 mL/min (ref >=60)
GFR, Est Non African American: 75 mL/min (ref >=60)
Glucose, Bld: 89 mg/dL (ref 65–99)
Potassium: 4.5 mmol/L (ref 3.5–5.3)
Sodium: 140 mmol/L (ref 135–146)

## 2017-03-27 MED ORDER — SULFAMETHOXAZOLE-TRIMETHOPRIM 400-80 MG PO TABS: 1.0000 | ORAL_TABLET | Freq: Two times a day (BID) | ORAL | 11 refills | Status: DC

## 2017-03-27 NOTE — Progress Notes (Signed)
Subjective:   Chief complaint: Pain in her left leg near where she had pin placed for IM nail for fracture of left hip   [name], female    DOB: 04-13-1922, 83 y.o.   MRN: [id number]  HPI  Leah Holland is a 83 year old lady with admission in April of 2017 with right intertrochanteric proximal femur nonunion who progressed to ultimately break her intramedullary nail and elected for surgical management. She had removal of hardware including intramedullary nail, and treatment of intertrochanteric fracture with prosthesis placement. Dr. [name] got several intra-operative cultures due to concerrn for infection and two separate cultures grew serratia. Patient was placed on oral levaquin after discussion with me and has remained on this since then. She has done relatively well but recently began to complain of pain behind her prosthetic knees which she believed might be due to toxicity of fluoroquinolone to tendons after she and her son--who is a Pediatrician read about side effects of FQ.   I was  actually skeptical about FQ toxicity here but I DID think she would be better served over the long run if she is to be on chronic suppressive antibiotics NOT being on a fluoroquinolone given risk for Clostridium difficile colitis in particular. We changed her  over to single strength Bactrim and her tendon pain went away immediately. She has had normal creatinine and potassium while on single strength Bactrim.  Since I last saw her she fractured her opposite hip and underwent IM nail by Dr. Erlinda Hong who is following her closely.    She states that her infected hip "doesn't bother me at all" but on closer questioning she states that she does have back pain at times going down down the side of her hip and leg.   The newly fractured hip hurts at proximal part of her incision and she has some anxiety about infection here. It does get better with PT and does NOT hurt all of the time. Also  responds to NSAID  NO fever or chills or systemic symptoms.     Past Medical History:  Diagnosis Date  . Anxiety attack    "take RX prn" (08/13/2013)  . Arthritis    "probably" (08/13/2013)  . Atrial fibrillation ([place])    new onset/pt report 08/13/2013  . Bronchitis 12/2011  . Complication of anesthesia   . Depression   . Diabetes mellitus type 2, diet-controlled ([place])   . GERD (gastroesophageal reflux disease)   . H/O hiatal hernia   . Hardware complicating wound infection ([place]) 08/22/2016  . History of blood transfusion    "w/both knee OR's" (08/13/2013)  . Hyperlipidemia   . Hyperlipidemia   . Hypertension   . Hypothyroidism    takes synthroid  . Insomnia   . Intertrochanteric fracture of right hip ([place]) 07/30/2015  . Neuromuscular disorder ([place])    carpal tunnel in right hand  . Periprosthetic fracture around internal prosthetic right hip joint ([place]), nonunion basicervical femoral neck with broken hardware and subtrochanteric fracture 03/16/2016  . PONV (postoperative nausea and vomiting)   . 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 Proteus infection 08/22/2016  . Serratia infection 11/14/2016  . Skin cancer of face 2000's   "one on each side of my face and one on my nose" (08/13/2013)  . Swelling of extremity    sees Dr. Gwenlyn Found, cardiologist 1 x year [phone]  . Tendon dysfunction 08/22/2016    Past Surgical History:  Procedure Laterality Date  . 2D ECHOCARDIOGRAM  12/07/2004   EF 48%, moderate pulmonary hypertension  . BREAST BIOPSY Left ?1970's  . CARDIOVASCULAR STRESS TEST  12/07/11  . CARDIOVASCULAR STRESS TEST  12/07/2011   LEXISCAN, normal scan, no significant wall abnormalities noted  . CARPAL TUNNEL RELEASE Right ~ 2005  . CATARACT EXTRACTION, BILATERAL Bilateral 2000  . DILATION AND CURETTAGE OF UTERUS  1950's  . DOPPLER ECHOCARDIOGRAPHY  12/07/11  . EYE SURGERY    . FEMUR IM NAIL Right 08/01/2015   Procedure: INTRAMEDULLARY (IM) NAIL FEMORAL;  Surgeon:  Marchia Bond, MD;  Location: [place];  Service: Orthopedics;  Laterality: Right;  . HARDWARE REMOVAL Right 03/16/2016   Procedure: HARDWARE REMOVAL;  Surgeon: Marchia Bond, MD;  Location: [place];  Service: Orthopedics;  Laterality: Right;  . INTRAMEDULLARY (IM) NAIL INTERTROCHANTERIC Left 01/26/2017   Procedure: INTRAMEDULLARY (IM) NAIL INTERTROCHANTRIC;  Surgeon: Leandrew Koyanagi, MD;  Location: [place];  Service: Orthopedics;  Laterality: Left;  . JOINT REPLACEMENT     bilateral knee replacement  . SKIN CANCER EXCISION  2000's   "off my nose" (08/13/2013)  . TOTAL HIP ARTHROPLASTY Right 03/16/2016   Procedure: TOTAL HIP ARTHROPLASTY;  Surgeon: Marchia Bond, MD;  Location: [place];  Service: Orthopedics;  Laterality: Right;  . TOTAL KNEE ARTHROPLASTY Right 2005  . TOTAL KNEE ARTHROPLASTY  03/02/2012   Procedure: TOTAL KNEE ARTHROPLASTY;  Surgeon: Yvette Rack., MD;  Location: [place];  Service: Orthopedics;  Laterality: Left;    Family History  Problem Relation Age of Onset  . Arrhythmia Mother   . Heart disease Mother   . Arrhythmia Sister   . Stroke Father 55  . Heart disease Brother   . Heart disease Brother   . Cancer Brother     Colon cancer  . Arrhythmia Brother   . Heart disease Brother   . Heart disease Sister   . Heart disease Sister   . Stroke Sister   . Diabetes Sister   . Heart disease Child 79  . Heart disease Child 32      Social History   Social History  . Marital status: Widowed    Spouse name: N/A  . Number of children: N/A  . Years of education: N/A   Social History Main Topics  . Smoking status: Never Smoker  . Smokeless tobacco: Never Used  . Alcohol use No  . Drug use: No  . Sexual activity: No   Other Topics Concern  . None   Social History Narrative   Widowed. Lives at [facility]. Son died in the [place] War and she has not slept well since then.   Admitted to [facility] 01/30/17   Never smoked   Alcohol none   Full Code     Allergies  Allergen Reactions  . Levaquin [Levofloxacin] Other (See Comments)    tendinopathy  . Morphine And Related Other (See Comments)    Made pt "wild"     Current Outpatient Prescriptions:  .  acetaminophen (TYLENOL) 325 MG tablet, Take 650 mg by mouth every 6 (six) hours as needed for moderate pain. , Disp: , Rfl:  .  aspirin EC 81 MG tablet, Take 81 mg by mouth daily., Disp: , Rfl:  .  atorvastatin (LIPITOR) 80 MG tablet, Take 80 mg by mouth daily., Disp: , Rfl:  .  calcium carbonate (OS-CAL - DOSED IN MG OF ELEMENTAL CALCIUM) 1250 (500 Ca) MG tablet, Take 1 tablet  by mouth every evening., Disp: , Rfl:  .  Cholecalciferol 2000 units CAPS, Take 2,000 Units by mouth every evening., Disp: , Rfl:  .  denosumab (PROLIA) 60 MG/ML SOLN injection, Inject 60 mg into the skin every 6 (six) months. Administer in upper arm, thigh, or abdomen, Disp: , Rfl:  .  diltiazem (CARDIZEM CD) 180 MG 24 hr capsule, Take 1 capsule (180 mg total) by mouth daily., Disp: 30 capsule, Rfl: 10 .  DULoxetine (CYMBALTA) 30 MG capsule, Take 30 mg by mouth daily., Disp: , Rfl:  .  ELIQUIS 2.5 MG TABS tablet, TAKE 1 TABLET TWICE DAILY., Disp: 60 tablet, Rfl: 5 .  esomeprazole (NEXIUM) 20 MG capsule, Take 20 mg by mouth daily at 12 noon., Disp: , Rfl:  .  ferrous sulfate 325 (65 FE) MG tablet, Take 1 tablet (325 mg total) by mouth 3 (three) times daily with meals., Disp: 90 tablet, Rfl: 0 .  Flaxseed, Linseed, (FLAXSEED OIL) 1000 MG CAPS, Take 1,000 mg by mouth. Take one capsule twice a day, Disp: , Rfl:  .  furosemide (LASIX) 20 MG tablet, Take 1 tablet (20 mg total) by mouth as needed for edema., Disp: 30 tablet, Rfl: 5 .  HYDROcodone-acetaminophen (NORCO) 7.5-325 MG tablet, Take 1-2 tablets by mouth every 6 (six) hours as needed for moderate pain., Disp: 90 tablet, Rfl: 0 .  levothyroxine (SYNTHROID, LEVOTHROID) 150 MCG tablet, Take 150 mcg by mouth daily before breakfast., Disp: , Rfl:  .  methocarbamol  (ROBAXIN) 500 MG tablet, Take 1 tablet (500 mg total) by mouth every 8 (eight) hours as needed for muscle spasms., Disp: 15 tablet, Rfl: 0 .  metoprolol (LOPRESSOR) 50 MG tablet, Take 50 mg by mouth 2 (two) times daily., Disp: , Rfl:  .  Multiple Vitamins-Minerals (MULTIVITAMIN) tablet, Take 1 tablet by mouth. Take one tablet twice daily, Disp: , Rfl:  .  polyethylene glycol (MIRALAX / GLYCOLAX) packet, Take 17 g by mouth daily., Disp: 14 each, Rfl: 0 .  potassium chloride (K-DUR) 10 MEQ tablet, Take 10 mEq by mouth. Take one tablet as needed when giving lasix as needed, Disp: , Rfl:  .  senna-docusate (SENOKOT-S) 8.6-50 MG tablet, Take 1 tablet by mouth at bedtime as needed for mild constipation., Disp: 30 tablet, Rfl: 0 .  sulfamethoxazole-trimethoprim (BACTRIM) 400-80 MG tablet, Take 1 tablet by mouth 2 (two) times daily., Disp: 60 tablet, Rfl: 8 .  temazepam (RESTORIL) 30 MG capsule, Take 1 capsule (30 mg total) by mouth at bedtime as needed for sleep., Disp: 2 capsule, Rfl: 0   Review of Systems  Constitutional: Negative for activity change, appetite change, chills, diaphoresis and fever.  HENT: Negative for congestion and sore throat.   Respiratory: Negative for cough and wheezing.   Cardiovascular: Negative for leg swelling.  Gastrointestinal: Negative for blood in stool, nausea and vomiting.  Genitourinary: Negative for dysuria, flank pain and hematuria.  Musculoskeletal: Positive for arthralgias. Negative for back pain.  Skin: Negative for rash.  Neurological: Negative for dizziness, weakness and headaches.  Hematological: Does not bruise/bleed easily.  Psychiatric/Behavioral: The patient is not nervous/anxious.        Objective:   Physical Exam  Constitutional: She is oriented to person, place, and time. She appears well-developed and well-nourished. No distress.  HENT:  Head: Normocephalic and atraumatic.  Mouth/Throat: No oropharyngeal exudate.  Eyes: Conjunctivae and EOM  are normal. No scleral icterus.  Neck: Normal range of motion. Neck supple.  Cardiovascular: Normal rate and  regular rhythm.   Pulmonary/Chest: Effort normal. No respiratory distress. She has no wheezes.  Abdominal: She exhibits no distension.  Musculoskeletal: She exhibits no edema or tenderness.  Neurological: She is alert and oriented to person, place, and time. She exhibits normal muscle tone. Coordination normal.  Skin: Skin is warm and dry. No rash noted. She is not diaphoretic. No erythema.  Psychiatric: She has a normal mood and affect. Her behavior is normal. Judgment normal.    Her surgical site on both sides is CDI      Assessment & Plan:    Prosthetic hip infection: we did have several cases of serratia contaminating surgical specimens due to contaminated saline used in prep of SOME cultures done here at [facility]. However without certainty that this is a contaminant I need to keep her on antibiotics given risk esp at her age of recurrent PJI  continue Bactrim SS BID and she has tolerated this quite well. We will recheck labs again today.\  RTC in 6 months and can push out to once yearly provided safety labs are being done  Pain at new surgical site: no overt evidence of infection  We spent greater than 25 minutes with the patient including greater than 50% of time in face to face counsel of the patient and her son re prosthetic hip infection with serratia, risk for FQ toxicities including tendonapathies, CDI, risks of bactrim including pseuodo and actual hyperkalemia and renal dysfunction and in coordination of her care.

## 2017-03-28 LAB — SEDIMENTATION RATE: Sed Rate: 1 mm/hr (ref 0–30)

## 2017-03-28 LAB — C-REACTIVE PROTEIN: CRP: 0.4 mg/L (ref <8.0)

## 2017-03-29 DIAGNOSIS — M6281 Muscle weakness (generalized): Secondary | ICD-10-CM | POA: Diagnosis not present

## 2017-03-29 DIAGNOSIS — M722 Plantar fascial fibromatosis: Secondary | ICD-10-CM | POA: Diagnosis not present

## 2017-03-29 DIAGNOSIS — R41841 Cognitive communication deficit: Secondary | ICD-10-CM | POA: Diagnosis not present

## 2017-03-29 DIAGNOSIS — Z471 Aftercare following joint replacement surgery: Secondary | ICD-10-CM | POA: Diagnosis not present

## 2017-03-29 DIAGNOSIS — M5431 Sciatica, right side: Secondary | ICD-10-CM | POA: Diagnosis not present

## 2017-03-29 DIAGNOSIS — R2681 Unsteadiness on feet: Secondary | ICD-10-CM | POA: Diagnosis not present

## 2017-03-31 DIAGNOSIS — M5431 Sciatica, right side: Secondary | ICD-10-CM | POA: Diagnosis not present

## 2017-03-31 DIAGNOSIS — M6281 Muscle weakness (generalized): Secondary | ICD-10-CM | POA: Diagnosis not present

## 2017-03-31 DIAGNOSIS — Z471 Aftercare following joint replacement surgery: Secondary | ICD-10-CM | POA: Diagnosis not present

## 2017-03-31 DIAGNOSIS — R2681 Unsteadiness on feet: Secondary | ICD-10-CM | POA: Diagnosis not present

## 2017-03-31 DIAGNOSIS — R41841 Cognitive communication deficit: Secondary | ICD-10-CM | POA: Diagnosis not present

## 2017-03-31 DIAGNOSIS — M722 Plantar fascial fibromatosis: Secondary | ICD-10-CM | POA: Diagnosis not present

## 2017-04-03 ENCOUNTER — Ambulatory Visit (INDEPENDENT_AMBULATORY_CARE_PROVIDER_SITE_OTHER): Payer: Medicare Other | Admitting: Orthopaedic Surgery

## 2017-04-03 DIAGNOSIS — R2681 Unsteadiness on feet: Secondary | ICD-10-CM | POA: Diagnosis not present

## 2017-04-03 DIAGNOSIS — Z471 Aftercare following joint replacement surgery: Secondary | ICD-10-CM | POA: Diagnosis not present

## 2017-04-03 DIAGNOSIS — M722 Plantar fascial fibromatosis: Secondary | ICD-10-CM | POA: Diagnosis not present

## 2017-04-03 DIAGNOSIS — M6281 Muscle weakness (generalized): Secondary | ICD-10-CM | POA: Diagnosis not present

## 2017-04-03 DIAGNOSIS — M5431 Sciatica, right side: Secondary | ICD-10-CM | POA: Diagnosis not present

## 2017-04-03 DIAGNOSIS — R41841 Cognitive communication deficit: Secondary | ICD-10-CM | POA: Diagnosis not present

## 2017-04-04 ENCOUNTER — Ambulatory Visit (INDEPENDENT_AMBULATORY_CARE_PROVIDER_SITE_OTHER): Payer: Medicare Other

## 2017-04-04 ENCOUNTER — Ambulatory Visit (INDEPENDENT_AMBULATORY_CARE_PROVIDER_SITE_OTHER): Payer: Medicare Other | Admitting: Orthopaedic Surgery

## 2017-04-04 DIAGNOSIS — G8929 Other chronic pain: Secondary | ICD-10-CM | POA: Diagnosis not present

## 2017-04-04 DIAGNOSIS — S72092D Other fracture of head and neck of left femur, subsequent encounter for closed fracture with routine healing: Secondary | ICD-10-CM

## 2017-04-04 DIAGNOSIS — M25562 Pain in left knee: Secondary | ICD-10-CM

## 2017-04-04 NOTE — Progress Notes (Signed)
Leah Holland is about 10 weeks status post intertrochanteric hip fracture. She is overall doing well. She complains of some pain in the quadriceps tendon with activity. Overall her hip is doing well. She is wearing compression socks and doing physical therapy 3 times a week and ambulating with a walker. X-ray show evidence of healing without complication. Clinically she is doing well. The surgical scar is well-healed. At this point I will like to see her back in 2 months with repeat 2 view x-rays of the left hip.

## 2017-04-05 DIAGNOSIS — Z471 Aftercare following joint replacement surgery: Secondary | ICD-10-CM | POA: Diagnosis not present

## 2017-04-05 DIAGNOSIS — M5431 Sciatica, right side: Secondary | ICD-10-CM | POA: Diagnosis not present

## 2017-04-05 DIAGNOSIS — R2681 Unsteadiness on feet: Secondary | ICD-10-CM | POA: Diagnosis not present

## 2017-04-05 DIAGNOSIS — R41841 Cognitive communication deficit: Secondary | ICD-10-CM | POA: Diagnosis not present

## 2017-04-05 DIAGNOSIS — M6281 Muscle weakness (generalized): Secondary | ICD-10-CM | POA: Diagnosis not present

## 2017-04-05 DIAGNOSIS — M722 Plantar fascial fibromatosis: Secondary | ICD-10-CM | POA: Diagnosis not present

## 2017-04-07 ENCOUNTER — Ambulatory Visit (INDEPENDENT_AMBULATORY_CARE_PROVIDER_SITE_OTHER): Payer: Medicare Other | Admitting: Orthopaedic Surgery

## 2017-04-07 DIAGNOSIS — M5431 Sciatica, right side: Secondary | ICD-10-CM | POA: Diagnosis not present

## 2017-04-07 DIAGNOSIS — R2681 Unsteadiness on feet: Secondary | ICD-10-CM | POA: Diagnosis not present

## 2017-04-07 DIAGNOSIS — Z471 Aftercare following joint replacement surgery: Secondary | ICD-10-CM | POA: Diagnosis not present

## 2017-04-07 DIAGNOSIS — R41841 Cognitive communication deficit: Secondary | ICD-10-CM | POA: Diagnosis not present

## 2017-04-07 DIAGNOSIS — M6281 Muscle weakness (generalized): Secondary | ICD-10-CM | POA: Diagnosis not present

## 2017-04-07 DIAGNOSIS — M722 Plantar fascial fibromatosis: Secondary | ICD-10-CM | POA: Diagnosis not present

## 2017-04-10 DIAGNOSIS — Z471 Aftercare following joint replacement surgery: Secondary | ICD-10-CM | POA: Diagnosis not present

## 2017-04-10 DIAGNOSIS — R2681 Unsteadiness on feet: Secondary | ICD-10-CM | POA: Diagnosis not present

## 2017-04-10 DIAGNOSIS — R41841 Cognitive communication deficit: Secondary | ICD-10-CM | POA: Diagnosis not present

## 2017-04-10 DIAGNOSIS — M722 Plantar fascial fibromatosis: Secondary | ICD-10-CM | POA: Diagnosis not present

## 2017-04-10 DIAGNOSIS — M6281 Muscle weakness (generalized): Secondary | ICD-10-CM | POA: Diagnosis not present

## 2017-04-10 DIAGNOSIS — M5431 Sciatica, right side: Secondary | ICD-10-CM | POA: Diagnosis not present

## 2017-04-12 DIAGNOSIS — M21751 Unequal limb length (acquired), right femur: Secondary | ICD-10-CM | POA: Diagnosis not present

## 2017-04-12 DIAGNOSIS — Z4789 Encounter for other orthopedic aftercare: Secondary | ICD-10-CM | POA: Diagnosis not present

## 2017-04-12 DIAGNOSIS — Z471 Aftercare following joint replacement surgery: Secondary | ICD-10-CM | POA: Diagnosis not present

## 2017-04-12 DIAGNOSIS — R2681 Unsteadiness on feet: Secondary | ICD-10-CM | POA: Diagnosis not present

## 2017-04-12 DIAGNOSIS — M722 Plantar fascial fibromatosis: Secondary | ICD-10-CM | POA: Diagnosis not present

## 2017-04-12 DIAGNOSIS — M6281 Muscle weakness (generalized): Secondary | ICD-10-CM | POA: Diagnosis not present

## 2017-04-14 DIAGNOSIS — R2681 Unsteadiness on feet: Secondary | ICD-10-CM | POA: Diagnosis not present

## 2017-04-14 DIAGNOSIS — M21751 Unequal limb length (acquired), right femur: Secondary | ICD-10-CM | POA: Diagnosis not present

## 2017-04-14 DIAGNOSIS — Z4789 Encounter for other orthopedic aftercare: Secondary | ICD-10-CM | POA: Diagnosis not present

## 2017-04-14 DIAGNOSIS — M6281 Muscle weakness (generalized): Secondary | ICD-10-CM | POA: Diagnosis not present

## 2017-04-14 DIAGNOSIS — Z471 Aftercare following joint replacement surgery: Secondary | ICD-10-CM | POA: Diagnosis not present

## 2017-04-14 DIAGNOSIS — M722 Plantar fascial fibromatosis: Secondary | ICD-10-CM | POA: Diagnosis not present

## 2017-04-17 DIAGNOSIS — R2681 Unsteadiness on feet: Secondary | ICD-10-CM | POA: Diagnosis not present

## 2017-04-17 DIAGNOSIS — M722 Plantar fascial fibromatosis: Secondary | ICD-10-CM | POA: Diagnosis not present

## 2017-04-17 DIAGNOSIS — M21751 Unequal limb length (acquired), right femur: Secondary | ICD-10-CM | POA: Diagnosis not present

## 2017-04-17 DIAGNOSIS — Z471 Aftercare following joint replacement surgery: Secondary | ICD-10-CM | POA: Diagnosis not present

## 2017-04-17 DIAGNOSIS — Z4789 Encounter for other orthopedic aftercare: Secondary | ICD-10-CM | POA: Diagnosis not present

## 2017-04-17 DIAGNOSIS — M6281 Muscle weakness (generalized): Secondary | ICD-10-CM | POA: Diagnosis not present

## 2017-04-19 DIAGNOSIS — M6281 Muscle weakness (generalized): Secondary | ICD-10-CM | POA: Diagnosis not present

## 2017-04-19 DIAGNOSIS — M21751 Unequal limb length (acquired), right femur: Secondary | ICD-10-CM | POA: Diagnosis not present

## 2017-04-19 DIAGNOSIS — Z471 Aftercare following joint replacement surgery: Secondary | ICD-10-CM | POA: Diagnosis not present

## 2017-04-19 DIAGNOSIS — Z4789 Encounter for other orthopedic aftercare: Secondary | ICD-10-CM | POA: Diagnosis not present

## 2017-04-19 DIAGNOSIS — M722 Plantar fascial fibromatosis: Secondary | ICD-10-CM | POA: Diagnosis not present

## 2017-04-19 DIAGNOSIS — R2681 Unsteadiness on feet: Secondary | ICD-10-CM | POA: Diagnosis not present

## 2017-04-21 DIAGNOSIS — Z4789 Encounter for other orthopedic aftercare: Secondary | ICD-10-CM | POA: Diagnosis not present

## 2017-04-21 DIAGNOSIS — R2681 Unsteadiness on feet: Secondary | ICD-10-CM | POA: Diagnosis not present

## 2017-04-21 DIAGNOSIS — M722 Plantar fascial fibromatosis: Secondary | ICD-10-CM | POA: Diagnosis not present

## 2017-04-21 DIAGNOSIS — M21751 Unequal limb length (acquired), right femur: Secondary | ICD-10-CM | POA: Diagnosis not present

## 2017-04-21 DIAGNOSIS — M6281 Muscle weakness (generalized): Secondary | ICD-10-CM | POA: Diagnosis not present

## 2017-04-21 DIAGNOSIS — Z471 Aftercare following joint replacement surgery: Secondary | ICD-10-CM | POA: Diagnosis not present

## 2017-04-24 DIAGNOSIS — M21751 Unequal limb length (acquired), right femur: Secondary | ICD-10-CM | POA: Diagnosis not present

## 2017-04-24 DIAGNOSIS — M6281 Muscle weakness (generalized): Secondary | ICD-10-CM | POA: Diagnosis not present

## 2017-04-24 DIAGNOSIS — E039 Hypothyroidism, unspecified: Secondary | ICD-10-CM | POA: Diagnosis not present

## 2017-04-24 DIAGNOSIS — M722 Plantar fascial fibromatosis: Secondary | ICD-10-CM | POA: Diagnosis not present

## 2017-04-24 DIAGNOSIS — Z471 Aftercare following joint replacement surgery: Secondary | ICD-10-CM | POA: Diagnosis not present

## 2017-04-24 DIAGNOSIS — R2681 Unsteadiness on feet: Secondary | ICD-10-CM | POA: Diagnosis not present

## 2017-04-24 DIAGNOSIS — D509 Iron deficiency anemia, unspecified: Secondary | ICD-10-CM | POA: Diagnosis not present

## 2017-04-24 DIAGNOSIS — R748 Abnormal levels of other serum enzymes: Secondary | ICD-10-CM | POA: Diagnosis not present

## 2017-04-24 DIAGNOSIS — Z4789 Encounter for other orthopedic aftercare: Secondary | ICD-10-CM | POA: Diagnosis not present

## 2017-04-24 DIAGNOSIS — Z96649 Presence of unspecified artificial hip joint: Secondary | ICD-10-CM | POA: Diagnosis not present

## 2017-04-24 DIAGNOSIS — E785 Hyperlipidemia, unspecified: Secondary | ICD-10-CM | POA: Diagnosis not present

## 2017-04-24 DIAGNOSIS — I10 Essential (primary) hypertension: Secondary | ICD-10-CM | POA: Diagnosis not present

## 2017-04-28 DIAGNOSIS — M6281 Muscle weakness (generalized): Secondary | ICD-10-CM | POA: Diagnosis not present

## 2017-04-28 DIAGNOSIS — M722 Plantar fascial fibromatosis: Secondary | ICD-10-CM | POA: Diagnosis not present

## 2017-04-28 DIAGNOSIS — M21751 Unequal limb length (acquired), right femur: Secondary | ICD-10-CM | POA: Diagnosis not present

## 2017-04-28 DIAGNOSIS — R2681 Unsteadiness on feet: Secondary | ICD-10-CM | POA: Diagnosis not present

## 2017-04-28 DIAGNOSIS — Z471 Aftercare following joint replacement surgery: Secondary | ICD-10-CM | POA: Diagnosis not present

## 2017-04-28 DIAGNOSIS — Z4789 Encounter for other orthopedic aftercare: Secondary | ICD-10-CM | POA: Diagnosis not present

## 2017-05-01 DIAGNOSIS — M722 Plantar fascial fibromatosis: Secondary | ICD-10-CM | POA: Diagnosis not present

## 2017-05-01 DIAGNOSIS — R2681 Unsteadiness on feet: Secondary | ICD-10-CM | POA: Diagnosis not present

## 2017-05-01 DIAGNOSIS — Z4789 Encounter for other orthopedic aftercare: Secondary | ICD-10-CM | POA: Diagnosis not present

## 2017-05-01 DIAGNOSIS — Z471 Aftercare following joint replacement surgery: Secondary | ICD-10-CM | POA: Diagnosis not present

## 2017-05-01 DIAGNOSIS — M6281 Muscle weakness (generalized): Secondary | ICD-10-CM | POA: Diagnosis not present

## 2017-05-01 DIAGNOSIS — M21751 Unequal limb length (acquired), right femur: Secondary | ICD-10-CM | POA: Diagnosis not present

## 2017-05-05 DIAGNOSIS — Z471 Aftercare following joint replacement surgery: Secondary | ICD-10-CM | POA: Diagnosis not present

## 2017-05-05 DIAGNOSIS — M21751 Unequal limb length (acquired), right femur: Secondary | ICD-10-CM | POA: Diagnosis not present

## 2017-05-05 DIAGNOSIS — Z4789 Encounter for other orthopedic aftercare: Secondary | ICD-10-CM | POA: Diagnosis not present

## 2017-05-05 DIAGNOSIS — M6281 Muscle weakness (generalized): Secondary | ICD-10-CM | POA: Diagnosis not present

## 2017-05-05 DIAGNOSIS — R2681 Unsteadiness on feet: Secondary | ICD-10-CM | POA: Diagnosis not present

## 2017-05-05 DIAGNOSIS — M722 Plantar fascial fibromatosis: Secondary | ICD-10-CM | POA: Diagnosis not present

## 2017-05-08 DIAGNOSIS — R2681 Unsteadiness on feet: Secondary | ICD-10-CM | POA: Diagnosis not present

## 2017-05-08 DIAGNOSIS — Z471 Aftercare following joint replacement surgery: Secondary | ICD-10-CM | POA: Diagnosis not present

## 2017-05-08 DIAGNOSIS — Z4789 Encounter for other orthopedic aftercare: Secondary | ICD-10-CM | POA: Diagnosis not present

## 2017-05-08 DIAGNOSIS — M722 Plantar fascial fibromatosis: Secondary | ICD-10-CM | POA: Diagnosis not present

## 2017-05-08 DIAGNOSIS — M6281 Muscle weakness (generalized): Secondary | ICD-10-CM | POA: Diagnosis not present

## 2017-05-08 DIAGNOSIS — M21751 Unequal limb length (acquired), right femur: Secondary | ICD-10-CM | POA: Diagnosis not present

## 2017-05-12 DIAGNOSIS — M722 Plantar fascial fibromatosis: Secondary | ICD-10-CM | POA: Diagnosis not present

## 2017-05-12 DIAGNOSIS — M21751 Unequal limb length (acquired), right femur: Secondary | ICD-10-CM | POA: Diagnosis not present

## 2017-05-12 DIAGNOSIS — R2681 Unsteadiness on feet: Secondary | ICD-10-CM | POA: Diagnosis not present

## 2017-05-12 DIAGNOSIS — Z471 Aftercare following joint replacement surgery: Secondary | ICD-10-CM | POA: Diagnosis not present

## 2017-05-12 DIAGNOSIS — Z4789 Encounter for other orthopedic aftercare: Secondary | ICD-10-CM | POA: Diagnosis not present

## 2017-05-12 DIAGNOSIS — M6281 Muscle weakness (generalized): Secondary | ICD-10-CM | POA: Diagnosis not present

## 2017-05-29 ENCOUNTER — Ambulatory Visit
Admission: RE | Admit: 2017-05-29 | Discharge: 2017-05-29 | Disposition: A | Discharge status: Home / Self Care | Payer: Medicare Other | Source: Ambulatory Visit | Attending: Orthopaedic Surgery | Admitting: Orthopaedic Surgery

## 2017-05-29 ENCOUNTER — Ambulatory Visit (INDEPENDENT_AMBULATORY_CARE_PROVIDER_SITE_OTHER): Payer: Medicare Other

## 2017-05-29 ENCOUNTER — Ambulatory Visit (INDEPENDENT_AMBULATORY_CARE_PROVIDER_SITE_OTHER): Payer: Medicare Other | Admitting: Orthopaedic Surgery

## 2017-05-29 DIAGNOSIS — S72092D Other fracture of head and neck of left femur, subsequent encounter for closed fracture with routine healing: Secondary | ICD-10-CM

## 2017-05-29 DIAGNOSIS — M79652 Pain in left thigh: Secondary | ICD-10-CM

## 2017-05-29 NOTE — Progress Notes (Signed)
Office Visit Note   Patient: Leah Holland           Date of Birth: 1922-03-16           MRN: [id number] Visit Date: 05/29/2017              Requested by: Estill Dooms, MD No address on file PCP: Estill Dooms, MD   Assessment & Plan: Visit Diagnoses:  1. Left thigh pain   2. Closed comminuted fracture of left hip with routine healing, subsequent encounter     Plan: X-ray show interval breakage of the distal interlocking screw. I do not see any signs of cut out of the lag screw. The proximal portion of the nail appears to be slightly bent into varus. I do not see any evidence of breakage of the nail itself. I do not see any evidence of nonunion. Clinically she does not present like nonunion since she was doing perfectly fine until this morning. I do need to get a CT scan of the left hip to rule out implant failure and or nonunion. We'll call the patient with the results.  Follow-Up Instructions: Return if symptoms worsen or fail to improve.   Orders:  Orders Placed This Encounter  Procedures  . XR FEMUR MIN 2 VIEWS LEFT  . CT HIP LEFT WO CONTRAST   No orders of the defined types were placed in this encounter.     Procedures: No procedures performed   Clinical Data: No additional findings.   Subjective: Chief Complaint  Patient presents with  . Left Leg - Pain    Leah Holland is 4 months status post intramedullary fixation of a left intertrochanteric hip fracture. She comes in today with acute pain in her anterior thigh and lateral knee from this morning. She had been doing very well until then. She denies any fevers or chills or any injuries.    Review of Systems  Constitutional: Negative.   HENT: Negative.   Eyes: Negative.   Respiratory: Negative.   Cardiovascular: Negative.   Endocrine: Negative.   Musculoskeletal: Negative.   Neurological: Negative.   Hematological: Negative.   Psychiatric/Behavioral: Negative.   All other systems reviewed  and are negative.    Objective: Vital Signs: There were no vitals taken for this visit.  Physical Exam  Constitutional: She is oriented to person, place, and time. She appears well-developed and well-nourished.  Pulmonary/Chest: Effort normal.  Neurological: She is alert and oriented to person, place, and time.  Skin: Skin is warm. Capillary refill takes less than 2 seconds.  Psychiatric: She has a normal mood and affect. Her behavior is normal. Judgment and thought content normal.  Nursing note and vitals reviewed.   Ortho Exam Left hip exam shows no significant discomfort with movement of the hip. She is endorse seeing pain in the thigh and the lateral knee. Lateral thighs also slightly tender. There is no swelling or bruising or redness. The surgical scars are fully healed. Specialty Comments:  No specialty comments available.  Imaging: Xr Femur Min 2 Views Left  Result Date: 05/29/2017 Interval breakage of distal interlocking screw. No evidence of lag screw cut out. Proximal portion of the nail appears to be slightly bent into varus. No evidence of nonunion.    PMFS History: Patient Active Problem List   Diagnosis Date Noted  . Ecchymosis 02/23/2017  . Closed comminuted fracture of left hip ([place]) 01/26/2017  . Leukocytosis 01/26/2017  . Fall 01/26/2017  . Serratia  infection 11/14/2016  . Hardware complicating wound infection ([place]) 08/22/2016  . 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 Tendinopathy 08/22/2016  . Clinical depression 04/07/2016  . Arthritis of knee, degenerative 04/07/2016  . OP (osteoporosis) 04/07/2016  . 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 Periprosthetic fracture around internal prosthetic right hip joint ([place]), nonunion basicervical femoral neck with broken hardware and subtrochanteric fracture 03/16/2016  . Hip fx ([place]) 03/16/2016  . Chronic anticoagulation 08/06/2015  . Bipolar disorder ([place]) 08/04/2015  . Hyponatremia 08/04/2015    . Constipation 08/04/2015  . Fracture, intertrochanteric, right femur ([place])   . S/P ORIF (open reduction internal fixation) fracture   . Complication of anesthesia 07/30/2015  . Diabetes mellitus type 2, diet-controlled ([place])   . Skin cancer of face   . Paroxysmal atrial fibrillation ([place]) 09/01/2014  . Right carotid bruit 11/18/2013  . Postoperative anemia due to acute blood loss 03/03/2012  . Hypertension   . Hyperlipidemia   . Swelling of extremity   . Cataract   . Hypothyroidism   . Arthritis   . Insomnia    Past Medical History:  Diagnosis Date  . Anxiety attack    "take RX prn" (08/13/2013)  . Arthritis    "probably" (08/13/2013)  . Atrial fibrillation ([place])    new onset/pt report 08/13/2013  . Bronchitis 12/2011  . Complication of anesthesia   . Depression   . Diabetes mellitus type 2, diet-controlled ([place])   . GERD (gastroesophageal reflux disease)   . H/O hiatal hernia   . Hardware complicating wound infection ([place]) 08/22/2016  . History of blood transfusion    "w/both knee OR's" (08/13/2013)  . Hyperlipidemia   . Hyperlipidemia   . Hypertension   . Hypothyroidism    takes synthroid  . Insomnia   . Intertrochanteric fracture of right hip ([place]) 07/30/2015  . Neuromuscular disorder ([place])    carpal tunnel in right hand  . Periprosthetic fracture around internal prosthetic right hip joint ([place]), nonunion basicervical femoral neck with broken hardware and subtrochanteric fracture 03/16/2016  . PONV (postoperative nausea and vomiting)   . 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 Proteus infection 08/22/2016  . Serratia infection 11/14/2016  . Skin cancer of face 2000's   "one on each side of my face and one on my nose" (08/13/2013)  . Swelling of extremity    sees Dr. Gwenlyn Found, cardiologist 1 x year [phone]  . Tendon dysfunction 08/22/2016    Family History  Problem Relation Age of Onset  . Arrhythmia Mother   . Heart disease Mother   . Arrhythmia Sister   . Stroke  Father 62  . Heart disease Brother   . Heart disease Brother   . Cancer Brother        Colon cancer  . Arrhythmia Brother   . Heart disease Brother   . Heart disease Sister   . Heart disease Sister   . Stroke Sister   . Diabetes Sister   . Heart disease Child 46  . Heart disease Child 67    Past Surgical History:  Procedure Laterality Date  . 2D ECHOCARDIOGRAM  12/07/2004   EF 48%, moderate pulmonary hypertension  . BREAST BIOPSY Left ?1970's  . CARDIOVASCULAR STRESS TEST  12/07/11  . CARDIOVASCULAR STRESS TEST  12/07/2011   LEXISCAN, normal scan, no significant wall abnormalities noted  . CARPAL TUNNEL RELEASE Right ~ 2005  . CATARACT EXTRACTION, BILATERAL Bilateral 2000  . DILATION AND CURETTAGE OF UTERUS  1950's  .  DOPPLER ECHOCARDIOGRAPHY  12/07/11  . EYE SURGERY    . FEMUR IM NAIL Right 08/01/2015   Procedure: INTRAMEDULLARY (IM) NAIL FEMORAL;  Surgeon: Marchia Bond, MD;  Location: [place];  Service: Orthopedics;  Laterality: Right;  . HARDWARE REMOVAL Right 03/16/2016   Procedure: HARDWARE REMOVAL;  Surgeon: Marchia Bond, MD;  Location: [place];  Service: Orthopedics;  Laterality: Right;  . INTRAMEDULLARY (IM) NAIL INTERTROCHANTERIC Left 01/26/2017   Procedure: INTRAMEDULLARY (IM) NAIL INTERTROCHANTRIC;  Surgeon: Leandrew Koyanagi, MD;  Location: [place];  Service: Orthopedics;  Laterality: Left;  . JOINT REPLACEMENT     bilateral knee replacement  . SKIN CANCER EXCISION  2000's   "off my nose" (08/13/2013)  . TOTAL HIP ARTHROPLASTY Right 03/16/2016   Procedure: TOTAL HIP ARTHROPLASTY;  Surgeon: Marchia Bond, MD;  Location: [place];  Service: Orthopedics;  Laterality: Right;  . TOTAL KNEE ARTHROPLASTY Right 2005  . TOTAL KNEE ARTHROPLASTY  03/02/2012   Procedure: TOTAL KNEE ARTHROPLASTY;  Surgeon: Yvette Rack., MD;  Location: [place];  Service: Orthopedics;  Laterality: Left;   Social History   Occupational History  . Not on file.   Social History Main Topics  . Smoking status:  Never Smoker  . Smokeless tobacco: Never Used  . Alcohol use No  . Drug use: No  . Sexual activity: No

## 2017-05-31 ENCOUNTER — Telehealth (INDEPENDENT_AMBULATORY_CARE_PROVIDER_SITE_OTHER): Payer: Self-pay | Admitting: Radiology

## 2017-05-31 MED ORDER — HYDROCODONE-ACETAMINOPHEN 7.5-325 MG PO TABS: 1.0000 | ORAL_TABLET | Freq: Four times a day (QID) | ORAL | 0 refills | Status: DC | PRN

## 2017-05-31 NOTE — Telephone Encounter (Signed)
Patient son will pick up script per Dr. Erlinda Hong.

## 2017-05-31 NOTE — Addendum Note (Signed)
Addended by: Azucena Cecil on: 05/31/2017 11:23 AM   Modules accepted: Orders

## 2017-05-31 NOTE — Telephone Encounter (Signed)
Can be picked up at front desk

## 2017-06-05 ENCOUNTER — Ambulatory Visit (INDEPENDENT_AMBULATORY_CARE_PROVIDER_SITE_OTHER): Payer: Medicare Other | Admitting: Orthopaedic Surgery

## 2017-06-05 ENCOUNTER — Telehealth (INDEPENDENT_AMBULATORY_CARE_PROVIDER_SITE_OTHER): Payer: Self-pay

## 2017-06-05 DIAGNOSIS — M79605 Pain in left leg: Secondary | ICD-10-CM

## 2017-06-05 NOTE — Telephone Encounter (Signed)
Patient's son Dr. Pamala Hurry would like a call back from Dr. Erlinda Hong concerning his mother.  CB# is [phone].

## 2017-06-05 NOTE — Telephone Encounter (Signed)
Please review. Thanks!  

## 2017-06-05 NOTE — Telephone Encounter (Signed)
I spoke to son and patient's thigh and hip pain are improved but still has a lot of knee pain. Can we order a stat CT scan of her left distal femur to r/o fracture.  Thanks.

## 2017-06-06 ENCOUNTER — Telehealth (INDEPENDENT_AMBULATORY_CARE_PROVIDER_SITE_OTHER): Payer: Self-pay | Admitting: Radiology

## 2017-06-06 ENCOUNTER — Telehealth (INDEPENDENT_AMBULATORY_CARE_PROVIDER_SITE_OTHER): Payer: Self-pay

## 2017-06-06 ENCOUNTER — Ambulatory Visit
Admission: RE | Admit: 2017-06-06 | Discharge: 2017-06-06 | Disposition: A | Discharge status: Home / Self Care | Payer: Medicare Other | Source: Ambulatory Visit | Attending: Orthopaedic Surgery | Admitting: Orthopaedic Surgery

## 2017-06-06 DIAGNOSIS — M79605 Pain in left leg: Secondary | ICD-10-CM

## 2017-06-06 DIAGNOSIS — S72142A Displaced intertrochanteric fracture of left femur, initial encounter for closed fracture: Secondary | ICD-10-CM | POA: Diagnosis not present

## 2017-06-06 NOTE — Addendum Note (Signed)
Addended byLaurann Montana on: 06/06/2017 08:27 AM   Modules accepted: Orders

## 2017-06-06 NOTE — Telephone Encounter (Signed)
Patient's son was calling to check the status of CT Scan.

## 2017-06-06 NOTE — Telephone Encounter (Signed)
Order sent and referral coordinator aware.

## 2017-06-06 NOTE — Telephone Encounter (Signed)
Call report CT left femur shows fracture proximal portion on the IM nail, at site of the 2 compression screws, and fracture distal interlocking screw, and increased angulation of the fracture.  Patient left facility and went home.

## 2017-06-07 ENCOUNTER — Ambulatory Visit (INDEPENDENT_AMBULATORY_CARE_PROVIDER_SITE_OTHER): Payer: Medicare Other | Admitting: Radiology

## 2017-06-07 DIAGNOSIS — Z01818 Encounter for other preprocedural examination: Secondary | ICD-10-CM

## 2017-06-07 LAB — CBC WITH DIFFERENTIAL/PLATELET
BASOS PCT: 0 %
Basophils Absolute: 0 cells/uL (ref 0–200)
EOS PCT: 4 %
Eosinophils Absolute: 184 cells/uL (ref 15–500)
HEMATOCRIT: 39.4 % (ref 35.0–45.0)
Hemoglobin: 12.9 g/dL (ref 11.7–15.5)
LYMPHS PCT: 31 %
Lymphs Abs: 1426 cells/uL (ref 850–3900)
MCH: 30.1 pg (ref 27.0–33.0)
MCHC: 32.7 g/dL (ref 32.0–36.0)
MCV: 91.8 fL (ref 80.0–100.0)
MONO ABS: 322 {cells}/uL (ref 200–950)
MONOS PCT: 7 %
MPV: 9.6 fL (ref 7.5–12.5)
NEUTROS PCT: 58 %
Neutro Abs: 2668 cells/uL (ref 1500–7800)
PLATELETS: 263 10*3/uL (ref 140–400)
RBC: 4.29 MIL/uL (ref 3.80–5.10)
RDW: 13.4 % (ref 11.0–15.0)
WBC: 4.6 10*3/uL (ref 3.8–10.8)

## 2017-06-07 NOTE — Progress Notes (Signed)
Lab only visit for surgery on 06/09/17 per Dr. Erlinda Hong

## 2017-06-08 ENCOUNTER — Encounter (HOSPITAL_COMMUNITY): Payer: Self-pay | Admitting: *Deleted

## 2017-06-08 ENCOUNTER — Other Ambulatory Visit (INDEPENDENT_AMBULATORY_CARE_PROVIDER_SITE_OTHER): Payer: Self-pay | Admitting: Orthopaedic Surgery

## 2017-06-08 DIAGNOSIS — S72002K Fracture of unspecified part of neck of left femur, subsequent encounter for closed fracture with nonunion: Secondary | ICD-10-CM

## 2017-06-08 LAB — SEDIMENTATION RATE: SED RATE: 3 mm/h (ref 0–30)

## 2017-06-08 LAB — C-REACTIVE PROTEIN: CRP: 4.5 mg/L (ref <8.0)

## 2017-06-08 MED ORDER — TRANEXAMIC ACID 1000 MG/10ML IV SOLN
1000.0000 mg | INTRAVENOUS | Status: AC
  Administered 2017-06-09: 1000 mg via INTRAVENOUS
  Filled 2017-06-08: qty 10

## 2017-06-08 NOTE — Progress Notes (Signed)
Pt denies SOB and chest pain but is under the care of Dr. Gwenlyn Found, Cardiology. Pt denies having a cardiac cath but stated that a stress test was performed > 10 years ago. Pt stated that her last dose of Eliquis was Tuesday, AM. Pt made aware to stop taking vitamins, fish oil, flax seed and herbal medications. Do not take any NSAIDs ie: Ibuprofen, Advil, Naproxen BC and Goody powder. Pt verbalized understanding of all pre-op instructions.

## 2017-06-09 ENCOUNTER — Inpatient Hospital Stay (HOSPITAL_COMMUNITY): Payer: Medicare Other

## 2017-06-09 ENCOUNTER — Encounter (HOSPITAL_COMMUNITY)
Admission: RE | Disposition: A | Discharge status: Skilled Nursing Facility | Payer: Self-pay | Source: Ambulatory Visit | Attending: Orthopaedic Surgery

## 2017-06-09 ENCOUNTER — Inpatient Hospital Stay (HOSPITAL_COMMUNITY): Payer: Medicare Other | Admitting: Anesthesiology

## 2017-06-09 ENCOUNTER — Inpatient Hospital Stay (HOSPITAL_COMMUNITY)
Admission: RE | Admit: 2017-06-09 | Discharge: 2017-06-12 | DRG: 470 | Disposition: A | Discharge status: Skilled Nursing Facility | Payer: Medicare Other | Source: Ambulatory Visit | Attending: Orthopaedic Surgery | Admitting: Orthopaedic Surgery

## 2017-06-09 ENCOUNTER — Encounter (HOSPITAL_COMMUNITY): Payer: Self-pay | Admitting: *Deleted

## 2017-06-09 DIAGNOSIS — Z7982 Long term (current) use of aspirin: Secondary | ICD-10-CM

## 2017-06-09 DIAGNOSIS — I4891 Unspecified atrial fibrillation: Secondary | ICD-10-CM | POA: Diagnosis present

## 2017-06-09 DIAGNOSIS — Z96653 Presence of artificial knee joint, bilateral: Secondary | ICD-10-CM | POA: Diagnosis present

## 2017-06-09 DIAGNOSIS — Z96649 Presence of unspecified artificial hip joint: Secondary | ICD-10-CM

## 2017-06-09 DIAGNOSIS — Z96641 Presence of right artificial hip joint: Secondary | ICD-10-CM | POA: Diagnosis present

## 2017-06-09 DIAGNOSIS — E785 Hyperlipidemia, unspecified: Secondary | ICD-10-CM | POA: Diagnosis present

## 2017-06-09 DIAGNOSIS — R278 Other lack of coordination: Secondary | ICD-10-CM | POA: Diagnosis not present

## 2017-06-09 DIAGNOSIS — Z471 Aftercare following joint replacement surgery: Secondary | ICD-10-CM | POA: Diagnosis not present

## 2017-06-09 DIAGNOSIS — K5909 Other constipation: Secondary | ICD-10-CM | POA: Diagnosis not present

## 2017-06-09 DIAGNOSIS — Z974 Presence of external hearing-aid: Secondary | ICD-10-CM | POA: Diagnosis not present

## 2017-06-09 DIAGNOSIS — Z881 Allergy status to other antibiotic agents status: Secondary | ICD-10-CM | POA: Diagnosis not present

## 2017-06-09 DIAGNOSIS — F339 Major depressive disorder, recurrent, unspecified: Secondary | ICD-10-CM | POA: Diagnosis not present

## 2017-06-09 DIAGNOSIS — R2681 Unsteadiness on feet: Secondary | ICD-10-CM | POA: Diagnosis not present

## 2017-06-09 DIAGNOSIS — M6281 Muscle weakness (generalized): Secondary | ICD-10-CM | POA: Diagnosis not present

## 2017-06-09 DIAGNOSIS — R1313 Dysphagia, pharyngeal phase: Secondary | ICD-10-CM | POA: Diagnosis not present

## 2017-06-09 DIAGNOSIS — M1990 Unspecified osteoarthritis, unspecified site: Secondary | ICD-10-CM | POA: Diagnosis not present

## 2017-06-09 DIAGNOSIS — Z885 Allergy status to narcotic agent status: Secondary | ICD-10-CM | POA: Diagnosis not present

## 2017-06-09 DIAGNOSIS — K219 Gastro-esophageal reflux disease without esophagitis: Secondary | ICD-10-CM | POA: Diagnosis present

## 2017-06-09 DIAGNOSIS — Z79899 Other long term (current) drug therapy: Secondary | ICD-10-CM

## 2017-06-09 DIAGNOSIS — I10 Essential (primary) hypertension: Secondary | ICD-10-CM | POA: Diagnosis present

## 2017-06-09 DIAGNOSIS — R41841 Cognitive communication deficit: Secondary | ICD-10-CM | POA: Diagnosis not present

## 2017-06-09 DIAGNOSIS — I480 Paroxysmal atrial fibrillation: Secondary | ICD-10-CM | POA: Diagnosis not present

## 2017-06-09 DIAGNOSIS — Z9841 Cataract extraction status, right eye: Secondary | ICD-10-CM

## 2017-06-09 DIAGNOSIS — Z7901 Long term (current) use of anticoagulants: Secondary | ICD-10-CM | POA: Diagnosis not present

## 2017-06-09 DIAGNOSIS — M9701XA Periprosthetic fracture around internal prosthetic right hip joint, initial encounter: Secondary | ICD-10-CM | POA: Diagnosis not present

## 2017-06-09 DIAGNOSIS — M21751 Unequal limb length (acquired), right femur: Secondary | ICD-10-CM | POA: Diagnosis not present

## 2017-06-09 DIAGNOSIS — F411 Generalized anxiety disorder: Secondary | ICD-10-CM | POA: Diagnosis present

## 2017-06-09 DIAGNOSIS — F5101 Primary insomnia: Secondary | ICD-10-CM | POA: Diagnosis not present

## 2017-06-09 DIAGNOSIS — T8451XA Infection and inflammatory reaction due to internal right hip prosthesis, initial encounter: Secondary | ICD-10-CM | POA: Diagnosis not present

## 2017-06-09 DIAGNOSIS — S72002K Fracture of unspecified part of neck of left femur, subsequent encounter for closed fracture with nonunion: Secondary | ICD-10-CM

## 2017-06-09 DIAGNOSIS — S72145K Nondisplaced intertrochanteric fracture of left femur, subsequent encounter for closed fracture with nonunion: Secondary | ICD-10-CM | POA: Diagnosis not present

## 2017-06-09 DIAGNOSIS — S72142K Displaced intertrochanteric fracture of left femur, subsequent encounter for closed fracture with nonunion: Secondary | ICD-10-CM | POA: Diagnosis not present

## 2017-06-09 DIAGNOSIS — R29898 Other symptoms and signs involving the musculoskeletal system: Secondary | ICD-10-CM | POA: Diagnosis not present

## 2017-06-09 DIAGNOSIS — T847XXS Infection and inflammatory reaction due to other internal orthopedic prosthetic devices, implants and grafts, sequela: Secondary | ICD-10-CM | POA: Diagnosis not present

## 2017-06-09 DIAGNOSIS — Z85828 Personal history of other malignant neoplasm of skin: Secondary | ICD-10-CM | POA: Diagnosis not present

## 2017-06-09 DIAGNOSIS — Z96642 Presence of left artificial hip joint: Secondary | ICD-10-CM | POA: Diagnosis not present

## 2017-06-09 DIAGNOSIS — S72092A Other fracture of head and neck of left femur, initial encounter for closed fracture: Secondary | ICD-10-CM | POA: Diagnosis not present

## 2017-06-09 DIAGNOSIS — E119 Type 2 diabetes mellitus without complications: Secondary | ICD-10-CM | POA: Diagnosis not present

## 2017-06-09 DIAGNOSIS — E1300 Other specified diabetes mellitus with hyperosmolarity without nonketotic hyperglycemic-hyperosmolar coma (NKHHC): Secondary | ICD-10-CM | POA: Diagnosis not present

## 2017-06-09 DIAGNOSIS — F419 Anxiety disorder, unspecified: Secondary | ICD-10-CM | POA: Diagnosis not present

## 2017-06-09 DIAGNOSIS — D62 Acute posthemorrhagic anemia: Secondary | ICD-10-CM | POA: Diagnosis not present

## 2017-06-09 DIAGNOSIS — Z9842 Cataract extraction status, left eye: Secondary | ICD-10-CM | POA: Diagnosis not present

## 2017-06-09 DIAGNOSIS — E039 Hypothyroidism, unspecified: Secondary | ICD-10-CM | POA: Diagnosis present

## 2017-06-09 DIAGNOSIS — M25551 Pain in right hip: Secondary | ICD-10-CM | POA: Diagnosis not present

## 2017-06-09 DIAGNOSIS — G894 Chronic pain syndrome: Secondary | ICD-10-CM | POA: Diagnosis not present

## 2017-06-09 DIAGNOSIS — Z4789 Encounter for other orthopedic aftercare: Secondary | ICD-10-CM | POA: Diagnosis not present

## 2017-06-09 DIAGNOSIS — K440 Diaphragmatic hernia with obstruction, without gangrene: Secondary | ICD-10-CM | POA: Diagnosis not present

## 2017-06-09 DIAGNOSIS — Z96643 Presence of artificial hip joint, bilateral: Secondary | ICD-10-CM | POA: Diagnosis not present

## 2017-06-09 HISTORY — DX: Unspecified fracture of unspecified femur, initial encounter for closed fracture: S72.90XA

## 2017-06-09 HISTORY — DX: Presence of external hearing-aid: Z97.4

## 2017-06-09 HISTORY — PX: TOTAL HIP ARTHROPLASTY: SHX124

## 2017-06-09 LAB — TYPE AND SCREEN
ABO/RH(D): O NEG
ANTIBODY SCREEN: NEGATIVE

## 2017-06-09 LAB — BASIC METABOLIC PANEL
Anion gap: 9 (ref 5–15)
BUN: 13 mg/dL (ref 6–20)
CALCIUM: 9.8 mg/dL (ref 8.9–10.3)
CO2: 25 mmol/L (ref 22–32)
CREATININE: 0.74 mg/dL (ref 0.44–1.00)
Chloride: 105 mmol/L (ref 101–111)
GFR calc non Af Amer: >60 mL/min (ref >60)
Glucose, Bld: 114 mg/dL — ABNORMAL HIGH (ref 65–99)
Potassium: 3.8 mmol/L (ref 3.5–5.1)
SODIUM: 139 mmol/L (ref 135–145)

## 2017-06-09 LAB — GLUCOSE, CAPILLARY: Glucose-Capillary: 106 mg/dL — ABNORMAL HIGH (ref 65–99)

## 2017-06-09 LAB — PROTIME-INR
INR: 1.03
PROTHROMBIN TIME: 13.5 s (ref 11.4–15.2)

## 2017-06-09 SURGERY — ARTHROPLASTY, HIP, TOTAL,POSTERIOR APPROACH
Anesthesia: General | Site: Hip | Laterality: Left

## 2017-06-09 MED ORDER — SORBITOL 70 % SOLN: 30.0000 mL | Freq: Every day | Status: DC | PRN

## 2017-06-09 MED ORDER — SENNOSIDES-DOCUSATE SODIUM 8.6-50 MG PO TABS
1.0000 | ORAL_TABLET | Freq: Every evening | ORAL | Status: DC | PRN
  Administered 2017-06-10 – 2017-06-11 (×2): 1 via ORAL
  Filled 2017-06-09 (×2): qty 1

## 2017-06-09 MED ORDER — CEFAZOLIN SODIUM-DEXTROSE 2-3 GM-% IV SOLR: INTRAVENOUS | Status: DC | PRN

## 2017-06-09 MED ORDER — DILTIAZEM HCL ER COATED BEADS 180 MG PO CP24
180.0000 mg | ORAL_CAPSULE | Freq: Every day | ORAL | Status: DC
  Administered 2017-06-10 – 2017-06-12 (×3): 180 mg via ORAL
  Filled 2017-06-09 (×3): qty 1

## 2017-06-09 MED ORDER — ONDANSETRON HCL 4 MG/2ML IJ SOLN
INTRAMUSCULAR | Status: DC | PRN
  Administered 2017-06-09: 4 mg via INTRAVENOUS

## 2017-06-09 MED ORDER — METHOCARBAMOL 500 MG PO TABS
500.0000 mg | ORAL_TABLET | Freq: Four times a day (QID) | ORAL | Status: DC | PRN
  Administered 2017-06-10 – 2017-06-11 (×3): 500 mg via ORAL
  Filled 2017-06-09 (×3): qty 1

## 2017-06-09 MED ORDER — MENTHOL 3 MG MT LOZG
1.0000 | LOZENGE | OROMUCOSAL | Status: DC | PRN
  Administered 2017-06-09: 3 mg via ORAL
  Filled 2017-06-09: qty 9

## 2017-06-09 MED ORDER — SODIUM CHLORIDE 0.9 % IR SOLN
Status: DC | PRN
  Administered 2017-06-09: 1000 mL

## 2017-06-09 MED ORDER — DULOXETINE HCL 30 MG PO CPEP
30.0000 mg | ORAL_CAPSULE | Freq: Every day | ORAL | Status: DC
  Administered 2017-06-10 – 2017-06-12 (×3): 30 mg via ORAL
  Filled 2017-06-09 (×3): qty 1

## 2017-06-09 MED ORDER — PROMETHAZINE HCL 25 MG/ML IJ SOLN: 6.2500 mg | INTRAMUSCULAR | Status: DC | PRN

## 2017-06-09 MED ORDER — CEFAZOLIN SODIUM 1 G IJ SOLR
INTRAMUSCULAR | Status: AC
  Filled 2017-06-09: qty 20

## 2017-06-09 MED ORDER — ATORVASTATIN CALCIUM 80 MG PO TABS
80.0000 mg | ORAL_TABLET | Freq: Every day | ORAL | Status: DC
  Administered 2017-06-10 – 2017-06-12 (×3): 80 mg via ORAL
  Filled 2017-06-09 (×3): qty 1

## 2017-06-09 MED ORDER — CEFAZOLIN SODIUM-DEXTROSE 2-3 GM-% IV SOLR
INTRAVENOUS | Status: DC | PRN
  Administered 2017-06-09: 2 g via INTRAVENOUS

## 2017-06-09 MED ORDER — SUCCINYLCHOLINE CHLORIDE 20 MG/ML IJ SOLN
INTRAMUSCULAR | Status: DC | PRN
  Administered 2017-06-09: 60 mg via INTRAVENOUS

## 2017-06-09 MED ORDER — LIDOCAINE HCL (CARDIAC) 20 MG/ML IV SOLN
INTRAVENOUS | Status: DC | PRN
  Administered 2017-06-09: 100 mg via INTRAVENOUS

## 2017-06-09 MED ORDER — TEMAZEPAM 15 MG PO CAPS
30.0000 mg | ORAL_CAPSULE | Freq: Every evening | ORAL | Status: DC | PRN
  Administered 2017-06-10 – 2017-06-11 (×2): 30 mg via ORAL
  Filled 2017-06-09 (×2): qty 2

## 2017-06-09 MED ORDER — ROCURONIUM BROMIDE 10 MG/ML (PF) SYRINGE
PREFILLED_SYRINGE | INTRAVENOUS | Status: AC
  Filled 2017-06-09: qty 5

## 2017-06-09 MED ORDER — FERROUS SULFATE 325 (65 FE) MG PO TABS
325.0000 mg | ORAL_TABLET | Freq: Three times a day (TID) | ORAL | Status: DC
  Administered 2017-06-10 – 2017-06-11 (×5): 325 mg via ORAL
  Filled 2017-06-09 (×7): qty 1

## 2017-06-09 MED ORDER — PHENYLEPHRINE HCL 10 MG/ML IJ SOLN
INTRAMUSCULAR | Status: DC | PRN
  Administered 2017-06-09: 10 ug/min via INTRAVENOUS

## 2017-06-09 MED ORDER — TRANEXAMIC ACID 1000 MG/10ML IV SOLN
INTRAVENOUS | Status: AC | PRN
  Administered 2017-06-09: 2000 mg via INTRAVENOUS

## 2017-06-09 MED ORDER — SUGAMMADEX SODIUM 200 MG/2ML IV SOLN
INTRAVENOUS | Status: DC | PRN
  Administered 2017-06-09: 100 mg via INTRAVENOUS

## 2017-06-09 MED ORDER — KETOROLAC TROMETHAMINE 30 MG/ML IJ SOLN
INTRAMUSCULAR | Status: AC
  Administered 2017-06-09: 30 mg
  Filled 2017-06-09: qty 1

## 2017-06-09 MED ORDER — ALUM & MAG HYDROXIDE-SIMETH 200-200-20 MG/5ML PO SUSP: 30.0000 mL | ORAL | Status: DC | PRN

## 2017-06-09 MED ORDER — DEXAMETHASONE SODIUM PHOSPHATE 10 MG/ML IJ SOLN
INTRAMUSCULAR | Status: AC
  Filled 2017-06-09: qty 1

## 2017-06-09 MED ORDER — METOPROLOL TARTRATE 50 MG PO TABS
50.0000 mg | ORAL_TABLET | Freq: Two times a day (BID) | ORAL | Status: DC
  Administered 2017-06-10 – 2017-06-12 (×6): 50 mg via ORAL
  Filled 2017-06-09 (×6): qty 1

## 2017-06-09 MED ORDER — METHOCARBAMOL 1000 MG/10ML IJ SOLN
500.0000 mg | Freq: Four times a day (QID) | INTRAVENOUS | Status: DC | PRN
  Filled 2017-06-09: qty 5

## 2017-06-09 MED ORDER — DIPHENHYDRAMINE HCL 12.5 MG/5ML PO ELIX
25.0000 mg | ORAL_SOLUTION | ORAL | Status: DC | PRN
Start: 2017-06-09 — End: 2017-06-12
  Administered 2017-06-10 (×2): 25 mg via ORAL
  Filled 2017-06-09 (×2): qty 10

## 2017-06-09 MED ORDER — POLYETHYLENE GLYCOL 3350 17 G PO PACK
17.0000 g | PACK | Freq: Every day | ORAL | Status: DC | PRN
  Administered 2017-06-11: 17 g via ORAL
  Filled 2017-06-09 (×2): qty 1

## 2017-06-09 MED ORDER — MEPERIDINE HCL 25 MG/ML IJ SOLN: 6.2500 mg | INTRAMUSCULAR | Status: DC | PRN

## 2017-06-09 MED ORDER — ASPIRIN EC 81 MG PO TBEC
81.0000 mg | DELAYED_RELEASE_TABLET | Freq: Every day | ORAL | Status: DC
  Administered 2017-06-10 – 2017-06-12 (×3): 81 mg via ORAL
  Filled 2017-06-09 (×3): qty 1

## 2017-06-09 MED ORDER — METHOCARBAMOL 500 MG PO TABS: 500.0000 mg | ORAL_TABLET | Freq: Three times a day (TID) | ORAL | Status: DC | PRN

## 2017-06-09 MED ORDER — ACETAMINOPHEN 325 MG PO TABS
650.0000 mg | ORAL_TABLET | Freq: Four times a day (QID) | ORAL | Status: DC | PRN
Start: 2017-06-09 — End: 2017-06-12
  Administered 2017-06-10 – 2017-06-11 (×3): 650 mg via ORAL
  Filled 2017-06-09 (×4): qty 2

## 2017-06-09 MED ORDER — PROPOFOL 10 MG/ML IV BOLUS
INTRAVENOUS | Status: DC | PRN
Start: 2017-06-09 — End: 2017-06-09
  Administered 2017-06-09: 80 mg via INTRAVENOUS
  Administered 2017-06-09: 50 mg via INTRAVENOUS

## 2017-06-09 MED ORDER — CHLORHEXIDINE GLUCONATE 4 % EX LIQD: 60.0000 mL | Freq: Once | CUTANEOUS | Status: DC

## 2017-06-09 MED ORDER — 0.9 % SODIUM CHLORIDE (POUR BTL) OPTIME
TOPICAL | Status: DC | PRN
  Administered 2017-06-09: 1000 mL

## 2017-06-09 MED ORDER — FENTANYL CITRATE (PF) 100 MCG/2ML IJ SOLN
INTRAMUSCULAR | Status: AC
  Administered 2017-06-09: 25 ug via INTRAVENOUS
  Filled 2017-06-09: qty 2

## 2017-06-09 MED ORDER — ALBUMIN HUMAN 5 % IV SOLN
INTRAVENOUS | Status: DC | PRN
  Administered 2017-06-09: 16:00:00 via INTRAVENOUS

## 2017-06-09 MED ORDER — FUROSEMIDE 20 MG PO TABS: 20.0000 mg | ORAL_TABLET | ORAL | Status: DC | PRN

## 2017-06-09 MED ORDER — TRANEXAMIC ACID 1000 MG/10ML IV SOLN
2000.0000 mg | INTRAVENOUS | Status: DC
  Filled 2017-06-09: qty 20

## 2017-06-09 MED ORDER — ONDANSETRON HCL 4 MG/2ML IJ SOLN: 4.0000 mg | Freq: Four times a day (QID) | INTRAMUSCULAR | Status: DC | PRN

## 2017-06-09 MED ORDER — ONDANSETRON HCL 4 MG PO TABS: 4.0000 mg | ORAL_TABLET | Freq: Four times a day (QID) | ORAL | Status: DC | PRN

## 2017-06-09 MED ORDER — ROCURONIUM BROMIDE 100 MG/10ML IV SOLN
INTRAVENOUS | Status: DC | PRN
  Administered 2017-06-09: 20 mg via INTRAVENOUS

## 2017-06-09 MED ORDER — MAGNESIUM CITRATE PO SOLN: 1.0000 | Freq: Once | ORAL | Status: DC | PRN

## 2017-06-09 MED ORDER — FENTANYL CITRATE (PF) 100 MCG/2ML IJ SOLN
25.0000 ug | INTRAMUSCULAR | Status: DC | PRN
  Administered 2017-06-09 (×2): 25 ug via INTRAVENOUS
  Administered 2017-06-09: 50 ug via INTRAVENOUS

## 2017-06-09 MED ORDER — FENTANYL CITRATE (PF) 250 MCG/5ML IJ SOLN
INTRAMUSCULAR | Status: AC
  Filled 2017-06-09: qty 5

## 2017-06-09 MED ORDER — POTASSIUM CHLORIDE CRYS ER 10 MEQ PO TBCR
10.0000 meq | EXTENDED_RELEASE_TABLET | Freq: Every day | ORAL | Status: DC
  Administered 2017-06-10 – 2017-06-12 (×3): 10 meq via ORAL
  Filled 2017-06-09 (×3): qty 1

## 2017-06-09 MED ORDER — APIXABAN 2.5 MG PO TABS
2.5000 mg | ORAL_TABLET | Freq: Two times a day (BID) | ORAL | Status: DC
  Administered 2017-06-10 – 2017-06-12 (×4): 2.5 mg via ORAL
  Filled 2017-06-09 (×4): qty 1

## 2017-06-09 MED ORDER — LACTATED RINGERS IV SOLN
INTRAVENOUS | Status: DC
  Administered 2017-06-09 (×4): via INTRAVENOUS

## 2017-06-09 MED ORDER — DEXAMETHASONE SODIUM PHOSPHATE 10 MG/ML IJ SOLN
10.0000 mg | Freq: Once | INTRAMUSCULAR | Status: AC
  Administered 2017-06-10: 10 mg via INTRAVENOUS
  Filled 2017-06-09: qty 1

## 2017-06-09 MED ORDER — FENTANYL CITRATE (PF) 100 MCG/2ML IJ SOLN
INTRAMUSCULAR | Status: DC | PRN
  Administered 2017-06-09: 100 ug via INTRAVENOUS
  Administered 2017-06-09: 50 ug via INTRAVENOUS
  Administered 2017-06-09 (×2): 25 ug via INTRAVENOUS
  Administered 2017-06-09: 50 ug via INTRAVENOUS

## 2017-06-09 MED ORDER — ACETAMINOPHEN 500 MG PO TABS
1000.0000 mg | ORAL_TABLET | Freq: Four times a day (QID) | ORAL | Status: AC
  Administered 2017-06-10 (×3): 1000 mg via ORAL
  Filled 2017-06-09 (×4): qty 2

## 2017-06-09 MED ORDER — CEFAZOLIN SODIUM-DEXTROSE 2-4 GM/100ML-% IV SOLN
2.0000 g | Freq: Four times a day (QID) | INTRAVENOUS | Status: AC
  Administered 2017-06-09 – 2017-06-10 (×2): 2 g via INTRAVENOUS
  Filled 2017-06-09 (×2): qty 100

## 2017-06-09 MED ORDER — OXYCODONE HCL 5 MG PO TABS
ORAL_TABLET | ORAL | Status: AC
  Administered 2017-06-09: 10 mg via ORAL
  Filled 2017-06-09: qty 2

## 2017-06-09 MED ORDER — OXYCODONE HCL ER 15 MG PO T12A
15.0000 mg | EXTENDED_RELEASE_TABLET | Freq: Two times a day (BID) | ORAL | Status: DC
  Administered 2017-06-09: 15 mg via ORAL
  Filled 2017-06-09: qty 1

## 2017-06-09 MED ORDER — LEVOTHYROXINE SODIUM 75 MCG PO TABS
150.0000 ug | ORAL_TABLET | Freq: Every day | ORAL | Status: DC
  Administered 2017-06-10 – 2017-06-12 (×3): 150 ug via ORAL
  Filled 2017-06-09 (×3): qty 2

## 2017-06-09 MED ORDER — CALCIUM CARBONATE 1250 (500 CA) MG PO TABS
1.0000 | ORAL_TABLET | Freq: Every evening | ORAL | Status: DC
  Administered 2017-06-10 – 2017-06-12 (×3): 500 mg via ORAL
  Filled 2017-06-09 (×3): qty 1

## 2017-06-09 MED ORDER — POLYETHYLENE GLYCOL 3350 17 G PO PACK
17.0000 g | PACK | Freq: Every day | ORAL | Status: DC
  Administered 2017-06-10 – 2017-06-12 (×3): 17 g via ORAL
  Filled 2017-06-09 (×2): qty 1

## 2017-06-09 MED ORDER — DEXAMETHASONE SODIUM PHOSPHATE 10 MG/ML IJ SOLN
INTRAMUSCULAR | Status: DC | PRN
  Administered 2017-06-09: 10 mg via INTRAVENOUS

## 2017-06-09 MED ORDER — KETOROLAC TROMETHAMINE 15 MG/ML IJ SOLN
30.0000 mg | Freq: Four times a day (QID) | INTRAMUSCULAR | Status: AC
  Administered 2017-06-09 – 2017-06-10 (×4): 30 mg via INTRAVENOUS
  Filled 2017-06-09 (×5): qty 2

## 2017-06-09 MED ORDER — LABETALOL HCL 5 MG/ML IV SOLN
INTRAVENOUS | Status: DC | PRN
  Administered 2017-06-09: 5 mg via INTRAVENOUS
  Administered 2017-06-09: 2.5 mg via INTRAVENOUS

## 2017-06-09 MED ORDER — VITAMIN D 1000 UNITS PO TABS
2000.0000 [IU] | ORAL_TABLET | Freq: Every evening | ORAL | Status: DC
  Administered 2017-06-10 – 2017-06-12 (×3): 2000 [IU] via ORAL
  Filled 2017-06-09 (×3): qty 2

## 2017-06-09 MED ORDER — METOCLOPRAMIDE HCL 5 MG/ML IJ SOLN: 5.0000 mg | Freq: Three times a day (TID) | INTRAMUSCULAR | Status: DC | PRN

## 2017-06-09 MED ORDER — ACETAMINOPHEN 650 MG RE SUPP: 650.0000 mg | Freq: Four times a day (QID) | RECTAL | Status: DC | PRN

## 2017-06-09 MED ORDER — SODIUM CHLORIDE 0.9 % IV SOLN
INTRAVENOUS | Status: DC
  Administered 2017-06-10: via INTRAVENOUS

## 2017-06-09 MED ORDER — TRANEXAMIC ACID 1000 MG/10ML IV SOLN
1000.0000 mg | Freq: Once | INTRAVENOUS | Status: AC
  Administered 2017-06-09: 1000 mg via INTRAVENOUS
  Filled 2017-06-09: qty 10

## 2017-06-09 MED ORDER — METOCLOPRAMIDE HCL 5 MG PO TABS
5.0000 mg | ORAL_TABLET | Freq: Three times a day (TID) | ORAL | Status: DC | PRN
Start: 2017-06-09 — End: 2017-06-12

## 2017-06-09 MED ORDER — PHENOL 1.4 % MT LIQD: 1.0000 | OROMUCOSAL | Status: DC | PRN

## 2017-06-09 MED ORDER — HYDROMORPHONE HCL 1 MG/ML IJ SOLN: 0.5000 mg | INTRAMUSCULAR | Status: DC | PRN

## 2017-06-09 MED ORDER — ACETAMINOPHEN 325 MG PO TABS: 650.0000 mg | ORAL_TABLET | Freq: Four times a day (QID) | ORAL | Status: DC | PRN

## 2017-06-09 MED ORDER — OXYCODONE HCL 5 MG PO TABS
5.0000 mg | ORAL_TABLET | ORAL | Status: DC | PRN
  Administered 2017-06-09: 10 mg via ORAL
  Administered 2017-06-10 (×2): 15 mg via ORAL
  Administered 2017-06-12: 5 mg via ORAL
  Filled 2017-06-09 (×2): qty 3
  Filled 2017-06-09: qty 1

## 2017-06-09 SURGICAL SUPPLY — 71 items
ADH SKN CLS APL DERMABOND .7 (GAUZE/BANDAGES/DRESSINGS) ×2
BAG DECANTER FOR FLEXI CONT (MISCELLANEOUS) ×2 IMPLANT
BIT DRILL SHORT 4.0 (BIT) IMPLANT
BLADE SAW SGTL 18X1.27X75 (BLADE) ×2 IMPLANT
BONE CEMENT PALACOS R-G (Cement) ×8 IMPLANT
CEMENT BONE PALACOS R-G (Cement) IMPLANT
CEMENT RESTRICTOR BONE PREP ST (KITS) ×2 IMPLANT
CENTRALIZER STM 11XRCN HIP FEM (Knees) IMPLANT
CNTRLZR STM 11XRECON HIP FEM (Knees) ×1 IMPLANT
COVER SURGICAL LIGHT HANDLE (MISCELLANEOUS) ×2 IMPLANT
DERMABOND ADVANCED (GAUZE/BANDAGES/DRESSINGS) ×2
DERMABOND ADVANCED .7 DNX12 (GAUZE/BANDAGES/DRESSINGS) ×2 IMPLANT
DISTAL CENTRALIZER 11MM (Knees) ×2 IMPLANT
DRAPE HIP W/POCKET STRL (DRAPE) ×2 IMPLANT
DRAPE INCISE IOBAN 85X60 (DRAPES) ×2 IMPLANT
DRAPE ORTHO SPLIT 77X108 STRL (DRAPES) ×2
DRAPE POUCH INSTRU U-SHP 10X18 (DRAPES) ×2 IMPLANT
DRAPE SURG 17X11 SM STRL (DRAPES) ×2 IMPLANT
DRAPE SURG ORHT 6 SPLT 77X108 (DRAPES) IMPLANT
DRAPE U-SHAPE 47X51 STRL (DRAPES) ×2 IMPLANT
DRILL BIT SHORT 4.0 (BIT) ×2
DRSG AQUACEL AG ADV 3.5X10 (GAUZE/BANDAGES/DRESSINGS) ×2 IMPLANT
DRSG AQUACEL AG ADV 3.5X14 (GAUZE/BANDAGES/DRESSINGS) ×2 IMPLANT
DRSG MEPILEX BORDER 4X12 (GAUZE/BANDAGES/DRESSINGS) ×1 IMPLANT
DRSG MEPILEX BORDER 4X4 (GAUZE/BANDAGES/DRESSINGS) ×1 IMPLANT
DURAPREP 26ML APPLICATOR (WOUND CARE) ×2 IMPLANT
ELECT BLADE TIP CTD 4 INCH (ELECTRODE) ×2 IMPLANT
ELECT REM PT RETURN 9FT ADLT (ELECTROSURGICAL) ×2
ELECTRODE REM PT RTRN 9FT ADLT (ELECTROSURGICAL) ×1 IMPLANT
FACESHIELD WRAPAROUND (MASK) ×2 IMPLANT
FACESHIELD WRAPAROUND OR TEAM (MASK) IMPLANT
GAUZE SPONGE 2X2 8PLY STRL LF (GAUZE/BANDAGES/DRESSINGS) ×1 IMPLANT
GLOVE BIO SURGEON STRL SZ8.5 (GLOVE) ×6 IMPLANT
GLOVE BIOGEL PI IND STRL 8.5 (GLOVE) ×1 IMPLANT
GLOVE BIOGEL PI INDICATOR 8.5 (GLOVE) ×1
GOWN SPEC L3 XXLG W/TWL (GOWN DISPOSABLE) ×4 IMPLANT
GOWN STRL REUS W/TWL 2XL LVL3 (GOWN DISPOSABLE) ×2 IMPLANT
HANDPIECE INTERPULSE COAX TIP (DISPOSABLE) ×2
HEAD 28MM 0 (Hips) ×1 IMPLANT
HOOD W/PEELAWAY (MISCELLANEOUS) ×4 IMPLANT
HOOKED GUIDE ROD ×1 IMPLANT
IMMOBILIZER KNEE 22 UNIV (SOFTGOODS) ×1 IMPLANT
IV NS 1000ML (IV SOLUTION) ×2
IV NS 1000ML BAXH (IV SOLUTION) IMPLANT
KIT BASIN OR (CUSTOM PROCEDURE TRAY) ×2 IMPLANT
LINER BIPOLAR 28MM (Liner) ×1 IMPLANT
MANIFOLD NEPTUNE II (INSTRUMENTS) ×2 IMPLANT
NS IRRIG 1000ML POUR BTL (IV SOLUTION) ×2 IMPLANT
PACK TOTAL JOINT (CUSTOM PROCEDURE TRAY) ×2 IMPLANT
PADDING CAST COTTON 6X4 STRL (CAST SUPPLIES) ×2 IMPLANT
ROD 5.5X85MM (Rod) ×1 IMPLANT
SEALER BIPOLAR AQUA 6.0 (INSTRUMENTS) ×1 IMPLANT
SET HNDPC FAN SPRY TIP SCT (DISPOSABLE) ×1 IMPLANT
SPONGE GAUZE 2X2 STER 10/PKG (GAUZE/BANDAGES/DRESSINGS) ×1
STEM FEMORAL SZ14 135MM 131D (Stem) ×1 IMPLANT
SUCTION FRAZIER HANDLE 10FR (MISCELLANEOUS) ×1
SUCTION TUBE FRAZIER 10FR DISP (MISCELLANEOUS) ×1 IMPLANT
SUT ETHIBOND NAB CT1 #1 30IN (SUTURE) ×2 IMPLANT
SUT MNCRL AB 3-0 PS2 27 (SUTURE) ×2 IMPLANT
SUT MON AB 2-0 CT1 36 (SUTURE) ×2 IMPLANT
SUT VIC AB 0 CT1 27 (SUTURE) ×4
SUT VIC AB 0 CT1 27XBRD ANBCTR (SUTURE) IMPLANT
SUT VIC AB 1 CT1 27 (SUTURE) ×10
SUT VIC AB 1 CT1 27XBRD ANBCTR (SUTURE) ×3 IMPLANT
SUT VIC AB 2-0 CT1 27 (SUTURE) ×14
SUT VIC AB 2-0 CT1 TAPERPNT 27 (SUTURE) ×2 IMPLANT
SUT VLOC 180 0 24IN GS25 (SUTURE) ×2 IMPLANT
SYR 50ML LL SCALE MARK (SYRINGE) ×2 IMPLANT
TOWEL OR 17X26 10 PK STRL BLUE (TOWEL DISPOSABLE) ×4 IMPLANT
TRAY CATH 16FR W/PLASTIC CATH (SET/KITS/TRAYS/PACK) IMPLANT
TRAY FOLEY W/METER SILVER 16FR (SET/KITS/TRAYS/PACK) ×2 IMPLANT

## 2017-06-09 NOTE — Anesthesia Postprocedure Evaluation (Signed)
Anesthesia Post Note  Patient: Leah Holland  Procedure(s) Performed: Procedure(s) (LRB): REMOVE LEFT HIP INTRAMEDULLARY NAIL AND CONVERT TO LEFT CEMENTED HEMI HIP ARTHROPLASTY (Left)     Patient location during evaluation: PACU Anesthesia Type: General Level of consciousness: awake Pain management: pain level controlled Vital Signs Assessment: post-procedure vital signs reviewed and stable Respiratory status: spontaneous breathing Cardiovascular status: stable Anesthetic complications: no    Last Vitals:  Vitals:   06/09/17 1150 06/09/17 1813  BP: (!) 239/77 (!) 185/82  Pulse:    Resp:  15  Temp:  36.7 C    Last Pain:  Vitals:   06/09/17 1140  TempSrc: Oral                 Kentley Cedillo

## 2017-06-09 NOTE — Transfer of Care (Signed)
Immediate Anesthesia Transfer of Care Note  Patient: Leah Holland  Procedure(s) Performed: Procedure(s): REMOVE LEFT HIP INTRAMEDULLARY NAIL AND CONVERT TO LEFT CEMENTED HEMI HIP ARTHROPLASTY (Left)  Patient Location: PACU  Anesthesia Type:General  Level of Consciousness: awake, alert , oriented and patient cooperative  Airway & Oxygen Therapy: Patient Spontanous Breathing and Patient connected to nasal cannula oxygen  Post-op Assessment: Report given to RN, Post -op Vital signs reviewed and stable, Patient moving all extremities and Patient moving all extremities X 4  Post vital signs: Reviewed and stable  Last Vitals:  Vitals:   06/09/17 1140 06/09/17 1150  BP: (!) 239/69 (!) 239/77  Pulse: 71   Resp: 18   Temp: 36.7 C     Last Pain:  Vitals:   06/09/17 1140  TempSrc: Oral      Patients Stated Pain Goal: 4 (20/23/34 3568)  Complications: No apparent anesthesia complications

## 2017-06-09 NOTE — Anesthesia Preprocedure Evaluation (Addendum)
Anesthesia Evaluation  Patient identified by MRN, date of birth, ID band Patient awake    Reviewed: Allergy & Precautions, NPO status , Patient's Chart, lab work & pertinent test results  History of Anesthesia Complications (+) PONV  Airway Mallampati: II  TM Distance: >3 FB Neck ROM: Full    Dental  (+) Edentulous Upper, Edentulous Lower   Pulmonary neg pulmonary ROS,    breath sounds clear to auscultation       Cardiovascular hypertension, Pt. on medications and Pt. on home beta blockers (-) angina+ dysrhythmias  Rhythm:Regular Rate:Normal   She had a Myoview stress test performed in our office on August 07, 2011, which was low risk.   Neuro/Psych PSYCHIATRIC DISORDERS Anxiety Depression Bipolar Disorder negative neurological ROS     GI/Hepatic Neg liver ROS, hiatal hernia, GERD  Controlled,  Endo/Other  diabetesHypothyroidism   Renal/GU negative Renal ROS     Musculoskeletal  (+) Arthritis ,   Abdominal   Peds  Hematology  (+) Blood dyscrasia (eliquis), anemia ,   Anesthesia Other Findings   Reproductive/Obstetrics                            Anesthesia Physical  Anesthesia Plan  ASA: III  Anesthesia Plan: General   Post-op Pain Management:    Induction:   PONV Risk Score and Plan: 4 or greater and Ondansetron, Dexamethasone, Propofol and Treatment may vary due to age or medical condition  Airway Management Planned: Oral ETT  Additional Equipment:   Intra-op Plan:   Post-operative Plan: Extubation in OR  Informed Consent: I have reviewed the patients History and Physical, chart, labs and discussed the procedure including the risks, benefits and alternatives for the proposed anesthesia with the patient or authorized representative who has indicated his/her understanding and acceptance.   Dental advisory given  Plan Discussed with: CRNA and Surgeon  Anesthesia Plan  Comments: (Patient reports checking BP at home with BP 130-140/60-70)      Anesthesia Quick Evaluation

## 2017-06-09 NOTE — H&P (Signed)
PREOPERATIVE H&P  Chief Complaint: left proximal femur non-union  HPI: Leah Holland is a 83 y.o. female who presents for surgical treatment of left proximal femur non-union.  She denies any changes in medical history.  Past Medical History:  Diagnosis Date  . Anxiety attack    "take RX prn" (08/13/2013)  . Arthritis    "probably" (08/13/2013)  . Atrial fibrillation ([place])    new onset/pt report 08/13/2013  . Bronchitis 12/2011  . Complication of anesthesia   . Depression   . Diabetes mellitus type 2, diet-controlled ([place])    denies  . Femur fracture ([facility])    left proximal femur non-union  . GERD (gastroesophageal reflux disease)   . H/O hiatal hernia   . Hardware complicating wound infection ([place]) 08/22/2016  . History of blood transfusion    "w/both knee OR's" (08/13/2013)  . Hyperlipidemia   . Hyperlipidemia   . Hypertension   . Hypothyroidism    takes synthroid  . Insomnia   . Intertrochanteric fracture of right hip ([place]) 07/30/2015  . Neuromuscular disorder ([place])    carpal tunnel in right hand  . Periprosthetic fracture around internal prosthetic right hip joint ([place]), nonunion basicervical femoral neck with broken hardware and subtrochanteric fracture 03/16/2016  . PONV (postoperative nausea and vomiting)   . Prosthetic hip infection ([place]) 08/22/2016  . Proteus infection 08/22/2016  . Serratia infection 11/14/2016  . Skin cancer of face 2000's   "one on each side of my face and one on my nose" (08/13/2013)  . Swelling of extremity    sees Dr. Gwenlyn Found, cardiologist 1 x year [phone]  . Tendon dysfunction 08/22/2016  . Wears hearing aid    [facility]   Past Surgical History:  Procedure Laterality Date  . 2D ECHOCARDIOGRAM  12/07/2004   EF 48%, moderate pulmonary hypertension  . BREAST BIOPSY Left ?1970's  . CARDIOVASCULAR STRESS TEST  12/07/11  . CARDIOVASCULAR STRESS TEST  12/07/2011   LEXISCAN, normal scan, no significant wall abnormalities noted  . CARPAL TUNNEL  RELEASE Right ~ 2005  . CATARACT EXTRACTION, BILATERAL Bilateral 2000  . DILATION AND CURETTAGE OF UTERUS  1950's  . DOPPLER ECHOCARDIOGRAPHY  12/07/11  . EYE SURGERY    . FEMUR IM NAIL Right 08/01/2015   Procedure: INTRAMEDULLARY (IM) NAIL FEMORAL;  Surgeon: Marchia Bond, MD;  Location: [place];  Service: Orthopedics;  Laterality: Right;  . HARDWARE REMOVAL Right 03/16/2016   Procedure: HARDWARE REMOVAL;  Surgeon: Marchia Bond, MD;  Location: [place];  Service: Orthopedics;  Laterality: Right;  . INTRAMEDULLARY (IM) NAIL INTERTROCHANTERIC Left 01/26/2017   Procedure: INTRAMEDULLARY (IM) NAIL INTERTROCHANTRIC;  Surgeon: Leandrew Koyanagi, MD;  Location: [place];  Service: Orthopedics;  Laterality: Left;  . JOINT REPLACEMENT     bilateral knee replacement  . SKIN CANCER EXCISION  2000's   "off my nose" (08/13/2013)  . TOTAL HIP ARTHROPLASTY Right 03/16/2016   Procedure: TOTAL HIP ARTHROPLASTY;  Surgeon: Marchia Bond, MD;  Location: [place];  Service: Orthopedics;  Laterality: Right;  . TOTAL KNEE ARTHROPLASTY Right 2005  . TOTAL KNEE ARTHROPLASTY  03/02/2012   Procedure: TOTAL KNEE ARTHROPLASTY;  Surgeon: Yvette Rack., MD;  Location: [facility];  Service: Orthopedics;  Laterality: Left;   Social History   Social History  . Marital status: Widowed    Spouse name: N/A  . Number of children: N/A  . Years of education: N/A   Social History Main Topics  . Smoking status: Never  Smoker  . Smokeless tobacco: Never Used  . Alcohol use No  . Drug use: No  . Sexual activity: No   Other Topics Concern  . None   Social History Narrative   Widowed. Lives at [facility]. Son died in the [place] War and she has not slept well since then.   Admitted to [facility] 01/30/17   Never smoked   Alcohol none   Full Code   Family History  Problem Relation Age of Onset  . Arrhythmia Mother   . Heart disease Mother   . Arrhythmia Sister   . Stroke Father 81  . Heart disease Brother   . Heart  disease Brother   . Cancer Brother        Colon cancer  . Arrhythmia Brother   . Heart disease Brother   . Heart disease Sister   . Heart disease Sister   . Stroke Sister   . Diabetes Sister   . Heart disease Child 38  . Heart disease Child 43   Allergies  Allergen Reactions  . Levaquin [Levofloxacin] Other (See Comments)    tendinopathy  . Morphine And Related Other (See Comments)    Made pt "wild"   Prior to Admission medications   Medication Sig Start Date End Date Taking? Authorizing Provider  acetaminophen (TYLENOL) 325 MG tablet Take 650 mg by mouth every 6 (six) hours as needed for moderate pain.     [provider]  aspirin EC 81 MG tablet Take 81 mg by mouth daily.    [provider]  atorvastatin (LIPITOR) 80 MG tablet Take 80 mg by mouth daily.    [provider]  calcium carbonate (OS-CAL - DOSED IN MG OF ELEMENTAL CALCIUM) 1250 (500 Ca) MG tablet Take 1 tablet by mouth every evening.    [provider]  Cholecalciferol 2000 units CAPS Take 2,000 Units by mouth every evening.    [provider]  denosumab (PROLIA) 60 MG/ML SOLN injection Inject 60 mg into the skin every 6 (six) months. Administer in upper arm, thigh, or abdomen    [provider]  diltiazem (CARDIZEM CD) 180 MG 24 hr capsule Take 1 capsule (180 mg total) by mouth daily. 10/10/16   Lorretta Harp, MD  DULoxetine (CYMBALTA) 30 MG capsule Take 30 mg by mouth daily.    [provider]  ELIQUIS 2.5 MG TABS tablet TAKE 1 TABLET TWICE DAILY. 11/21/16   Lorretta Harp, MD  esomeprazole (NEXIUM) 20 MG capsule Take 20 mg by mouth daily at 12 noon.    [provider]  ferrous sulfate 325 (65 FE) MG tablet Take 1 tablet (325 mg total) by mouth 3 (three) times daily with meals. 01/30/17   Lavina Hamman, MD  Flaxseed, Linseed, (FLAXSEED OIL) 1000 MG CAPS Take 1,000 mg by mouth. Take one capsule twice a day    [provider]    furosemide (LASIX) 20 MG tablet Take 1 tablet (20 mg total) by mouth as needed for edema. 06/08/16   Lorretta Harp, MD  HYDROcodone-acetaminophen (NORCO) 7.5-325 MG tablet Take 1-2 tablets by mouth every 6 (six) hours as needed for moderate pain. 05/31/17   Meredith Pel, MD  levothyroxine (SYNTHROID, LEVOTHROID) 150 MCG tablet Take 150 mcg by mouth daily before breakfast.    [provider]  methocarbamol (ROBAXIN) 500 MG tablet Take 1 tablet (500 mg total) by mouth every 8 (eight) hours as needed for muscle  spasms. 01/30/17   Lavina Hamman, MD  metoprolol (LOPRESSOR) 50 MG tablet Take 50 mg by mouth 2 (two) times daily.    [provider]  Multiple Vitamins-Minerals (MULTIVITAMIN) tablet Take 1 tablet by mouth. Take one tablet twice daily    [provider]  polyethylene glycol (MIRALAX / GLYCOLAX) packet Take 17 g by mouth daily. 01/30/17   Lavina Hamman, MD  potassium chloride (K-DUR) 10 MEQ tablet Take 10 mEq by mouth. Take one tablet as needed when giving lasix as needed    [provider]  senna-docusate (SENOKOT-S) 8.6-50 MG tablet Take 1 tablet by mouth at bedtime as needed for mild constipation. 01/30/17   Lavina Hamman, MD  sulfamethoxazole-trimethoprim (BACTRIM) 400-80 MG tablet Take 1 tablet by mouth 2 (two) times daily. 03/27/17   Truman Hayward, MD  temazepam (RESTORIL) 30 MG capsule Take 1 capsule (30 mg total) by mouth at bedtime as needed for sleep. 01/30/17   Lavina Hamman, MD     Positive ROS: All other systems have been reviewed and were otherwise negative with the exception of those mentioned in the HPI and as above.  Physical Exam: General: Alert, no acute distress Cardiovascular: No pedal edema Respiratory: No cyanosis, no use of accessory musculature GI: abdomen soft Skin: No lesions in the area of chief complaint Neurologic: Sensation intact distally Psychiatric: Patient is competent for consent with normal mood  and affect Lymphatic: no lymphedema  MUSCULOSKELETAL: exam stable  Assessment: left proximal femur non-union  Plan: Plan for Procedure(s): REMOVE LEFT HIP INTRAMEDULLARY NAIL AND CONVERT TO LEFT CEMENTED HEMI HIP ARTHROPLASTY  The risks benefits and alternatives were discussed with the patient including but not limited to the risks of nonoperative treatment, versus surgical intervention including infection, bleeding, nerve injury,  blood clots, cardiopulmonary complications, morbidity, mortality, among others, and they were willing to proceed.   Eduard Roux, MD   06/09/2017 11:30 AM

## 2017-06-09 NOTE — Anesthesia Procedure Notes (Signed)
Procedure Name: Intubation Date/Time: 06/09/2017 2:32 PM Performed by: Izora Gala Pre-anesthesia Checklist: Patient identified, Emergency Drugs available and Suction available Patient Re-evaluated:Patient Re-evaluated prior to inductionOxygen Delivery Method: Circle system utilized Preoxygenation: Pre-oxygenation with 100% oxygen Intubation Type: IV induction Ventilation: Mask ventilation without difficulty Laryngoscope Size: Miller and 2 Grade View: Grade I Tube type: Oral Tube size: 7.0 mm Number of attempts: 1 Airway Equipment and Method: LTA kit utilized Placement Confirmation: ETT inserted through vocal cords under direct vision,  positive ETCO2 and breath sounds checked- equal and bilateral Secured at: 22 cm Tube secured with: Tape Dental Injury: Teeth and Oropharynx as per pre-operative assessment

## 2017-06-09 NOTE — Op Note (Signed)
   Date of Surgery: 06/09/2017  INDICATIONS: Leah Holland is a 83 y.o.-year-old female who presents today for operative treatment of a nonunion of left intertrochanteric hip fracture with failure of the orthopedic implant. She was aware the risks benefits alternatives to surgery and she wished proceed. Consent was obtained.  PREOPERATIVE DIAGNOSIS:  1. Left intertrochanteric nonunion with implant failure  POSTOPERATIVE DIAGNOSIS: Same.  PROCEDURE:  1. Removal of left femur cephalomedullary implant 2. Removal of left distal interlocking screw through separate incision 3. Conversion of previous left hip surgery to left hip replacement  SURGEON: N. Eduard Roux, M.D.  ASSIST: Ky Barban, RNFA.  ANESTHESIA:  general  IV FLUIDS AND URINE: See anesthesia.  ESTIMATED BLOOD LOSS: see anesthesia record.  IMPLANTS: [employer] Synergy cemented stem size 14, bipolar head 43 mm  DRAINS: None  COMPLICATIONS: None.  DESCRIPTION OF PROCEDURE: The patient was brought to the operating room and placed in the lateral position on the operating table.  The patient had been signed prior to the procedure and this was documented. The patient had the anesthesia placed by the anesthesiologist.  A time-out was performed to confirm that this was the correct patient, site, side and location. The patient did receive antibiotics prior to the incision and was re-dosed during the procedure as needed at indicated intervals.  The patient had the operative extremity prepped and draped in the standard surgical fashion.    A standard posterior approach to the hip was used. With the proximal portion of the femur adequately exposed we first removed the 2 interdigitating lag screws from the cephalomedullary implant.  Once this was done we were then able to remove the proximal portion of the nail by hand. We then made a separate incision distally over the lateral aspect of the distal femur in order to remove the distal  interlocking screw. After this was accomplished we then turned our attention to identifying the nonunion. The proximal portion of the femur was then resected.  The proximal portion of the nail was then found inside the femoral canal. We're able to backed this out using a vice grip. Once the nail was out we then prepared the bony surfaces for implant of the hip replacement. Sequential reaming and broaching was then performed up to a size 14.  We then trialed multiple next and head sizes until we found a stable construct. We then removed the trial components and prepared the bony surfaces for cementing. A cement restrictor was then placed down into the canal to the appropriate depth. A cement gun was then used to fill the femoral canal. The final femoral stem was then placed down the femoral canal at the appropriate version. The cement was then allowed to harden. We then placed the appropriate head size on the implant. The hip was then reduced and stable to 90 of flexion and 55 of internal rotation. There was no shuck. Leg lengths were clinically even on the table. The wound was then thoroughly irrigated and closed in layer fashion using #2 Ethibond for the capsule, #1 Vicryl for the fascia, 0 Vicryl for the subcutaneous layer, 2-0 Vicryl for the deep skin layer and staples for the skin. Sterile dressings were applied. Leg was placed in knee immobilizer. Patient tolerated the procedure well and no immediate competitions.  POSTOPERATIVE PLAN: Patient will be admitted to the orthopedic service.  Azucena Cecil, MD [place] 5:34 PM

## 2017-06-09 NOTE — Anesthesia Postprocedure Evaluation (Signed)
Anesthesia Post Note  Patient: Leah Holland  Procedure(s) Performed: Procedure(s) (LRB): REMOVE LEFT HIP INTRAMEDULLARY NAIL AND CONVERT TO LEFT CEMENTED HEMI HIP ARTHROPLASTY (Left)     Patient location during evaluation: PACU Anesthesia Type: General Level of consciousness: awake, awake and alert and oriented Pain management: pain level controlled Vital Signs Assessment: post-procedure vital signs reviewed and stable Respiratory status: spontaneous breathing, nonlabored ventilation and respiratory function stable Cardiovascular status: blood pressure returned to baseline Anesthetic complications: no    Last Vitals:  Vitals:   06/09/17 2045 06/09/17 2148  BP: (!) 168/66 (!) 166/64  Pulse: 82 77  Resp: 13 18  Temp:  36.6 C    Last Pain:  Vitals:   06/09/17 2148  TempSrc: Oral  PainSc:                  [name]

## 2017-06-10 LAB — BASIC METABOLIC PANEL
Anion gap: 5 (ref 5–15)
BUN: 10 mg/dL (ref 6–20)
CO2: 27 mmol/L (ref 22–32)
Calcium: 8.4 mg/dL — ABNORMAL LOW (ref 8.9–10.3)
Chloride: 102 mmol/L (ref 101–111)
Creatinine, Ser: 0.74 mg/dL (ref 0.44–1.00)
GFR calc Af Amer: >60 mL/min (ref >60)
GFR calc non Af Amer: >60 mL/min (ref >60)
Glucose, Bld: 168 mg/dL — ABNORMAL HIGH (ref 65–99)
Potassium: 4.3 mmol/L (ref 3.5–5.1)
Sodium: 134 mmol/L — ABNORMAL LOW (ref 135–145)

## 2017-06-10 LAB — CBC
HCT: 29.4 % — ABNORMAL LOW (ref 36.0–46.0)
Hemoglobin: 9.5 g/dL — ABNORMAL LOW (ref 12.0–15.0)
MCH: 30.1 pg (ref 26.0–34.0)
MCHC: 32.3 g/dL (ref 30.0–36.0)
MCV: 93 fL (ref 78.0–100.0)
Platelets: 210 10*3/uL (ref 150–400)
RBC: 3.16 MIL/uL — ABNORMAL LOW (ref 3.87–5.11)
RDW: 13.4 % (ref 11.5–15.5)
WBC: 9.2 10*3/uL (ref 4.0–10.5)

## 2017-06-10 LAB — GLUCOSE, CAPILLARY
GLUCOSE-CAPILLARY: 196 mg/dL — AB (ref 65–99)
Glucose-Capillary: 211 mg/dL — ABNORMAL HIGH (ref 65–99)
Glucose-Capillary: 154 mg/dL — ABNORMAL HIGH (ref 65–99)

## 2017-06-10 MED ORDER — INSULIN ASPART 100 UNIT/ML ~~LOC~~ SOLN
0.0000 [IU] | Freq: Three times a day (TID) | SUBCUTANEOUS | Status: DC
  Administered 2017-06-10: 5 [IU] via SUBCUTANEOUS
  Administered 2017-06-10: 3 [IU] via SUBCUTANEOUS
  Administered 2017-06-11 (×3): 2 [IU] via SUBCUTANEOUS

## 2017-06-10 MED ORDER — INSULIN ASPART 100 UNIT/ML ~~LOC~~ SOLN: 0.0000 [IU] | Freq: Every day | SUBCUTANEOUS | Status: DC

## 2017-06-10 NOTE — Progress Notes (Signed)
   Subjective:  Patient reports pain as mild.  Sitting up in chair.  Very cheerful attitude  Objective:   VITALS:   Vitals:   06/09/17 2148 06/10/17 0011 06/10/17 0112 06/10/17 0502  BP: (!) 166/64 (!) 161/70 (!) 159/64 (!) 138/54  Pulse: 77 85 76 69  Resp: 18  18 18   Temp: 97.9 F (36.6 C)  98.1 F (36.7 C) 97.9 F (36.6 C)  TempSrc: Oral  Oral Oral  SpO2: 99%  97% 97%  Weight:      Height:        Neurologically intact Neurovascular intact Sensation intact distally Intact pulses distally Dorsiflexion/Plantar flexion intact Incision: dressing C/D/I and no drainage No cellulitis present Compartment soft   Lab Results  Component Value Date   WBC 9.2 06/10/2017   HGB 9.5 (L) 06/10/2017   HCT 29.4 (L) 06/10/2017   MCV 93.0 06/10/2017   PLT 210 06/10/2017     Assessment/Plan:  1 Day Post-Op   - Expected postop acute blood loss anemia - will monitor for symptoms - Up with PT/OT - DVT ppx - SCDs, ambulation, eliquis - WBAT operative extremity, posterior hip precautions - Pain controlled - will likely need SNF  Eduard Roux 06/10/2017, 9:49 AM [phone]

## 2017-06-10 NOTE — Evaluation (Signed)
Physical Therapy Evaluation Patient Details Name: Leah Holland MRN: [id number] DOB: 12/16/1921 Today's Date: 06/10/2017   History of Present Illness  Pt is a 83 y/o female s/p L THA, posterior hip precautions, secondary to comminuted fracture of L hip. PMH includes HTN, dysrhythmias, anxiety, bipolar, DM, a fib, s/p R femur IM nail and R THA, bilat TKA.   Clinical Impression  Pt is s/p surgery above with deficits below. PTA, pt reports she had been using WC for last two weeks secondary to fracture. Prior to her fall, pt reports she was walking with RW. Upon evaluation, pt limited by post op pain and weakness, decreased oxygen sats (see gait for details), and decreased balance. Required min to mod A for mobility this session. Pt reports she will be going to SNF at d/c at [facility] to increase independence with functional mobility. Will continue to follow acutely to maximize functional mobility independence.     Follow Up Recommendations SNF    Equipment Recommendations  None recommended by PT    Recommendations for Other Services       Precautions / Restrictions Precautions Precautions: Posterior Hip Precaution Booklet Issued: Yes (comment) Precaution Comments: Reviewed posterior hip precautions with pt as well as supine ther ex.  Required Braces or Orthoses: Knee Immobilizer - Left Restrictions Weight Bearing Restrictions: Yes LLE Weight Bearing: Weight bearing as tolerated      Mobility  Bed Mobility Overal bed mobility: Needs Assistance Bed Mobility: Supine to Sit     Supine to sit: Mod assist     General bed mobility comments: Mod A for LLE management and scooting hips to EOB.   Transfers Overall transfer level: Needs assistance Equipment used: Rolling walker (2 wheeled) Transfers: Sit to/from Stand Sit to Stand: Min assist         General transfer comment: Min A for lift assist. Verbal cues for safe hand placement.   Ambulation/Gait Ambulation/Gait  assistance: Min assist Ambulation Distance (Feet): 10 Feet Assistive device: Rolling walker (2 wheeled) Gait Pattern/deviations: Step-to pattern;Decreased step length - left;Decreased step length - right;Decreased weight shift to left;Antalgic Gait velocity: Decreased Gait velocity interpretation: Below normal speed for age/gender General Gait Details: Slow, antalgic gait secondary to post op pain and weakness. Required continuous cues for sequencing using RW. Distance limited secondary to decreased oxygen sats. Decreased to 87% on RA, however, with seated rest and use of incentive spirometry increased to 97% on RA. Cues to continue use of incentive spirometry throughout the day.   Stairs            Wheelchair Mobility    Modified Rankin (Stroke Patients Only)       Balance Overall balance assessment: Needs assistance Sitting-balance support: Feet supported;No upper extremity supported Sitting balance-Leahy Scale: Good     Standing balance support: Bilateral upper extremity supported;During functional activity Standing balance-Leahy Scale: Poor Standing balance comment: Reliant on use of RW for stability                             Pertinent Vitals/Pain Pain Assessment: Faces Faces Pain Scale: Hurts little more Pain Location: L hip  Pain Descriptors / Indicators: Grimacing;Operative site guarding Pain Intervention(s): Limited activity within patient's tolerance;Monitored during session;Repositioned    Home Living Family/patient expects to be discharged to:: Skilled nursing facility                 Additional Comments: [place]  Prior Function Level of Independence: Needs assistance   Gait / Transfers Assistance Needed: Used rollator up until she fell; used WC for past two weeks since fx  ADL's / Homemaking Assistance Needed: Independent with bathing and dressing; doesn't get into the shower unless someone is there         Hand Dominance    Dominant Hand: Right    Extremity/Trunk Assessment   Upper Extremity Assessment Upper Extremity Assessment: Generalized weakness    Lower Extremity Assessment Lower Extremity Assessment: LLE deficits/detail LLE Deficits / Details: Sensory in tact. Deficits consistent with post op pain and weakness. Able to perform exercise below.     Cervical / Trunk Assessment Cervical / Trunk Assessment: Kyphotic  Communication   Communication: HOH  Cognition Arousal/Alertness: Awake/alert Behavior During Therapy: WFL for tasks assessed/performed Overall Cognitive Status: Within Functional Limits for tasks assessed                                        General Comments General comments (skin integrity, edema, etc.): Pt oxygen sats fluctuating between 85%-97% on RA throughout session. When decreased pt required continuous cues for deep breathing and use of incentive spirtometry     Exercises Total Joint Exercises Ankle Circles/Pumps: AROM;Both;10 reps;Supine Quad Sets: AROM;Left;10 reps;Supine Hip ABduction/ADduction: AROM;Left;10 reps;Supine   Assessment/Plan    PT Assessment Patient needs continued PT services  PT Problem List Decreased strength;Decreased range of motion;Decreased balance;Decreased mobility;Decreased activity tolerance;Cardiopulmonary status limiting activity;Decreased knowledge of use of DME;Decreased knowledge of precautions;Pain       PT Treatment Interventions DME instruction;Gait training;Functional mobility training;Therapeutic activities;Therapeutic exercise;Balance training;Neuromuscular re-education;Patient/family education    PT Goals (Current goals can be found in the Care Plan section)  Acute Rehab PT Goals Patient Stated Goal: to go to rehab to get stronger PT Goal Formulation: With patient Time For Goal Achievement: 06/17/17 Potential to Achieve Goals: Good    Frequency 7X/week   Barriers to discharge        Co-evaluation                AM-PAC PT "6 Clicks" Daily Activity  Outcome Measure Difficulty turning over in bed (including adjusting bedclothes, sheets and blankets)?: A Lot Difficulty moving from lying on back to sitting on the side of the bed? : Total Difficulty sitting down on and standing up from a chair with arms (e.g., wheelchair, bedside commode, etc,.)?: Total Help needed moving to and from a bed to chair (including a wheelchair)?: A Little Help needed walking in hospital room?: A Little Help needed climbing 3-5 steps with a railing? : A Lot 6 Click Score: 12    End of Session Equipment Utilized During Treatment: Gait belt;Left knee immobilizer Activity Tolerance: Treatment limited secondary to medical complications (Comment) (decreased oxygen sats) Patient left: with call bell/phone within reach;in chair Nurse Communication: Mobility status PT Visit Diagnosis: Other abnormalities of gait and mobility (R26.89);Pain Pain - Right/Left: Left Pain - part of body: Hip    Time: 0865-7846 PT Time Calculation (min) (ACUTE ONLY): 23 min   Charges:   PT Evaluation $PT Eval Low Complexity: 1 Procedure PT Treatments $Gait Training: 8-22 mins   PT G Codes:        Leighton Ruff, PT, DPT  Acute Rehabilitation Services  Pager: [phone]   Rudean Hitt 06/10/2017, 10:30 AM

## 2017-06-11 LAB — CBC
HCT: 25.6 % — ABNORMAL LOW (ref 36.0–46.0)
Hemoglobin: 8.2 g/dL — ABNORMAL LOW (ref 12.0–15.0)
MCH: 29.8 pg (ref 26.0–34.0)
MCHC: 32 g/dL (ref 30.0–36.0)
MCV: 93.1 fL (ref 78.0–100.0)
PLATELETS: 211 10*3/uL (ref 150–400)
RBC: 2.75 MIL/uL — AB (ref 3.87–5.11)
RDW: 13.9 % (ref 11.5–15.5)
WBC: 14.9 10*3/uL — ABNORMAL HIGH (ref 4.0–10.5)

## 2017-06-11 LAB — BASIC METABOLIC PANEL
Anion gap: 4 — ABNORMAL LOW (ref 5–15)
BUN: 26 mg/dL — AB (ref 6–20)
CO2: 28 mmol/L (ref 22–32)
Calcium: 8.5 mg/dL — ABNORMAL LOW (ref 8.9–10.3)
Chloride: 101 mmol/L (ref 101–111)
Creatinine, Ser: 1.04 mg/dL — ABNORMAL HIGH (ref 0.44–1.00)
GFR calc Af Amer: 52 mL/min — ABNORMAL LOW (ref >60)
GFR, EST NON AFRICAN AMERICAN: 45 mL/min — AB (ref >60)
GLUCOSE: 150 mg/dL — AB (ref 65–99)
POTASSIUM: 4.8 mmol/L (ref 3.5–5.1)
Sodium: 133 mmol/L — ABNORMAL LOW (ref 135–145)

## 2017-06-11 LAB — POCT I-STAT 4, (NA,K, GLUC, HGB,HCT)
Glucose, Bld: 116 mg/dL — ABNORMAL HIGH (ref 65–99)
HEMATOCRIT: 30 % — AB (ref 36.0–46.0)
HEMOGLOBIN: 10.2 g/dL — AB (ref 12.0–15.0)
Potassium: 3.5 mmol/L (ref 3.5–5.1)
SODIUM: 140 mmol/L (ref 135–145)

## 2017-06-11 LAB — GLUCOSE, CAPILLARY
GLUCOSE-CAPILLARY: 108 mg/dL — AB (ref 65–99)
Glucose-Capillary: 132 mg/dL — ABNORMAL HIGH (ref 65–99)
Glucose-Capillary: 141 mg/dL — ABNORMAL HIGH (ref 65–99)
Glucose-Capillary: 139 mg/dL — ABNORMAL HIGH (ref 65–99)

## 2017-06-11 NOTE — Discharge Instructions (Addendum)
INSTRUCTIONS AFTER JOINT REPLACEMENT   o Remove items at home which could result in a fall. This includes throw rugs or furniture in walking pathways o ICE to the affected joint every three hours while awake for 30 minutes at a time, for at least the first 3-5 days, and then as needed for pain and swelling.  Continue to use ice for pain and swelling. You may notice swelling that will progress down to the foot and ankle.  This is normal after surgery.  Elevate your leg when you are not up walking on it.   o Continue to use the breathing machine you got in the hospital (incentive spirometer) which will help keep your temperature down.  It is common for your temperature to cycle up and down following surgery, especially at night when you are not up moving around and exerting yourself.  The breathing machine keeps your lungs expanded and your temperature down.   DIET:  As you were doing prior to hospitalization, we recommend a well-balanced diet.  DRESSING / WOUND CARE / SHOWERING  You may change your surgical dressing 7 days after surgery.  Then change the dressing every day with sterile gauze.  Please use good hand washing techniques before changing the dressing.  Do not use any lotions or creams on the incision until instructed by your surgeon.  You may shower while you have the surgical dressing which is waterproof.  After removal of surgical dressing, you must cover the incision when showering.  ACTIVITY  o Increase activity slowly as tolerated, but follow the weight bearing instructions below.   o No driving for 6 weeks or until further direction given by your physician.  You cannot drive while taking narcotics.  o No lifting or carrying greater than 10 lbs. until further directed by your surgeon. o Avoid periods of inactivity such as sitting longer than an hour when not asleep. This helps prevent blood clots.  o You may return to work once you are authorized by your doctor.     WEIGHT  BEARING   Weight bearing as tolerated with assist device (walker, cane, etc) as directed, use it as long as suggested by your surgeon or therapist, typically at least 4-6 weeks.   EXERCISES  Results after joint replacement surgery are often greatly improved when you follow the exercise, range of motion and muscle strengthening exercises prescribed by your doctor. Safety measures are also important to protect the joint from further injury. Any time any of these exercises cause you to have increased pain or swelling, decrease what you are doing until you are comfortable again and then slowly increase them. If you have problems or questions, call your caregiver or physical therapist for advice.   Rehabilitation is important following a joint replacement. After just a few days of immobilization, the muscles of the leg can become weakened and shrink (atrophy).  These exercises are designed to build up the tone and strength of the thigh and leg muscles and to improve motion. Often times heat used for twenty to thirty minutes before working out will loosen up your tissues and help with improving the range of motion but do not use heat for the first two weeks following surgery (sometimes heat can increase post-operative swelling).   These exercises can be done on a training (exercise) mat, on the floor, on a table or on a bed. Use whatever works the best and is most comfortable for you.    Use music or television while  you are exercising so that the exercises are a pleasant break in your day. This will make your life better with the exercises acting as a break in your routine that you can look forward to.   Perform all exercises about fifteen times, three times per day or as directed.  You should exercise both the operative leg and the other leg as well. ° °Exercises include: °  °• Quad Sets - Tighten up the muscle on the front of the thigh (Quad) and hold for 5-10 seconds.   °• Straight Leg Raises - With your  knee straight (if you were given a brace, keep it on), lift the leg to 60 degrees, hold for 3 seconds, and slowly lower the leg.  Perform this exercise against resistance later as your leg gets stronger.  °• Leg Slides: Lying on your back, slowly slide your foot toward your buttocks, bending your knee up off the floor (only go as far as is comfortable). Then slowly slide your foot back down until your leg is flat on the floor again.  °• Angel Wings: Lying on your back spread your legs to the side as far apart as you can without causing discomfort.  °• Hamstring Strength:  Lying on your back, push your heel against the floor with your leg straight by tightening up the muscles of your buttocks.  Repeat, but this time bend your knee to a comfortable angle, and push your heel against the floor.  You may put a pillow under the heel to make it more comfortable if necessary.  ° °A rehabilitation program following joint replacement surgery can speed recovery and prevent re-injury in the future due to weakened muscles. Contact your doctor or a physical therapist for more information on knee rehabilitation.  ° ° °CONSTIPATION ° °Constipation is defined medically as fewer than three stools per week and severe constipation as less than one stool per week.  Even if you have a regular bowel pattern at home, your normal regimen is likely to be disrupted due to multiple reasons following surgery.  Combination of anesthesia, postoperative narcotics, change in appetite and fluid intake all can affect your bowels.  ° °YOU MUST use at least one of the following options; they are listed in order of increasing strength to get the job done.  They are all available over the counter, and you may need to use some, POSSIBLY even all of these options:   ° °Drink plenty of fluids (prune juice may be helpful) and high fiber foods °Colace 100 mg by mouth twice a day  °Senokot for constipation as directed and as needed Dulcolax (bisacodyl), take  with full glass of water  °Miralax (polyethylene glycol) once or twice a day as needed. ° °If you have tried all these things and are unable to have a bowel movement in the first 3-4 days after surgery call either your surgeon or your primary doctor.   ° °If you experience loose stools or diarrhea, hold the medications until you stool forms back up.  If your symptoms do not get better within 1 week or if they get worse, check with your doctor.  If you experience "the worst abdominal pain ever" or develop nausea or vomiting, please contact the office immediately for further recommendations for treatment. ° ° °ITCHING:  If you experience itching with your medications, try taking only a single pain pill, or even half a pain pill at a time.  You can also use Benadryl over the counter   for itching or also to help with sleep.   TED HOSE STOCKINGS:  Use stockings on both legs until for at least 2 weeks or as directed by physician office. They may be removed at night for sleeping.  MEDICATIONS:  See your medication summary on the After Visit Summary that nursing will review with you.  You may have some home medications which will be placed on hold until you complete the course of blood thinner medication.  It is important for you to complete the blood thinner medication as prescribed.  PRECAUTIONS:  If you experience chest pain or shortness of breath - call 911 immediately for transfer to the hospital emergency department.   If you develop a fever greater that 101 F, purulent drainage from wound, increased redness or drainage from wound, foul odor from the wound/dressing, or calf pain - CONTACT YOUR SURGEON.                                                   FOLLOW-UP APPOINTMENTS:  If you do not already have a post-op appointment, please call the office for an appointment to be seen by your surgeon.  Guidelines for how soon to be seen are listed in your After Visit Summary, but are typically between 1-4 weeks  after surgery.  OTHER INSTRUCTIONS:   Knee Replacement:  Do not place pillow under knee, focus on keeping the knee straight while resting. CPM instructions: 0-90 degrees, 2 hours in the morning, 2 hours in the afternoon, and 2 hours in the evening. Place foam block, curve side up under heel at all times except when in CPM or when walking.  DO NOT modify, tear, cut, or change the foam block in any way.  MAKE SURE YOU:   Understand these instructions.   Get help right away if you are not doing well or get worse.    Thank you for letting us be a part of your medical care team.  It is a privilege we respect greatly.  We hope these instructions will help you stay on track for a fast and full recovery!         Information on my medicine - ELIQUIS (apixaban)  This medication education was reviewed with me or my healthcare representative as part of my discharge preparation.  The pharmacist that spoke with me during my hospital stay was:  Jaquita Folds, [facility]  Why was Eliquis prescribed for you? Eliquis was prescribed for you to reduce the risk of a blood clot forming that can cause a stroke if you have a medical condition called atrial fibrillation (a type of irregular heartbeat).  What do You need to know about Eliquis ? Take your Eliquis TWICE DAILY - one tablet in the morning and one tablet in the evening with or without food. If you have difficulty swallowing the tablet whole please discuss with your pharmacist how to take the medication safely.  Take Eliquis exactly as prescribed by your doctor and DO NOT stop taking Eliquis without talking to the doctor who prescribed the medication.  Stopping may increase your risk of developing a stroke.  Refill your prescription before you run out.  After discharge, you should have regular check-up appointments with your healthcare provider that is prescribing your Eliquis.  In the future your dose may need to be changed if your  kidney  function or weight changes by a significant amount or as you get older.  What do you do if you miss a dose? If you miss a dose, take it as soon as you remember on the same day and resume taking twice daily.  Do not take more than one dose of ELIQUIS at the same time to make up a missed dose.  Important Safety Information A possible side effect of Eliquis is bleeding. You should call your healthcare provider right away if you experience any of the following: ? Bleeding from an injury or your nose that does not stop. ? Unusual colored urine (red or dark brown) or unusual colored stools (red or black). ? Unusual bruising for unknown reasons. ? A serious fall or if you hit your head (even if there is no bleeding).  Some medicines may interact with Eliquis and might increase your risk of bleeding or clotting while on Eliquis. To help avoid this, consult your healthcare provider or pharmacist prior to using any new prescription or non-prescription medications, including herbals, vitamins, non-steroidal anti-inflammatory drugs (NSAIDs) and supplements.  This website has more information on Eliquis (apixaban): http://[email or web address].com/eliquis/home

## 2017-06-11 NOTE — Progress Notes (Signed)
   Subjective:  Patient reports pain as mild.  Sitting up in bed. Very cheerful attitude  Objective:   VITALS:   Vitals:   06/10/17 0502 06/10/17 1400 06/10/17 2026 06/11/17 0700  BP: (!) 138/54 (!) 138/53 (!) 135/45 (!) 154/54  Pulse: 69 71 81 72  Resp: 18 18 18 18   Temp: 97.9 F (36.6 C) 97.8 F (36.6 C) 98.2 F (36.8 C) 97.8 F (36.6 C)  TempSrc: Oral Axillary Oral Oral  SpO2: 97% 97% 94% 92%  Weight:      Height:        Neurologically intact Neurovascular intact Sensation intact distally Intact pulses distally Dorsiflexion/Plantar flexion intact Incision: dressing C/D/I and no drainage No cellulitis present Compartment soft   Lab Results  Component Value Date   WBC 14.9 (H) 06/11/2017   HGB 8.2 (L) 06/11/2017   HCT 25.6 (L) 06/11/2017   MCV 93.1 06/11/2017   PLT 211 06/11/2017     Assessment/Plan:  2 Days Post-Op   - Expected postop acute blood loss anemia - asymptomatic - Up with PT/OT - DVT ppx - SCDs, ambulation, eliquis - WBAT operative extremity, posterior hip precautions - Pain controlled - SNF monday  Eduard Roux 06/11/2017, 8:10 AM [phone]

## 2017-06-11 NOTE — Progress Notes (Signed)
Physical Therapy Treatment Patient Details Name: Leah Holland MRN: [id number] DOB: 04-18-1922 Today's Date: 06/11/2017    History of Present Illness Pt is a 83 y/o female s/p L THA, posterior hip precautions, secondary to comminuted fracture of L hip. PMH includes HTN, dysrhythmias, anxiety, bipolar, DM, a fib, s/p R femur IM nail and R THA, bilat TKA.     PT Comments    Pt performed increased gait and reviewed supine exercises during session.  Pt progressing well but remains to present with safety issues during session.  Pt able to verbally recall precautions but unable to maintain.  Pt continues to benefit from short term SNF placement to improve strength and functional mobility before returning home.     Follow Up Recommendations  SNF     Equipment Recommendations  None recommended by PT    Recommendations for Other Services       Precautions / Restrictions Precautions Precautions: Posterior Hip Precaution Booklet Issued: Yes (comment) Precaution Comments: Reviewed posterior hip precautions with pt as well as supine ther ex.  Required Braces or Orthoses: Knee Immobilizer - Left Restrictions Weight Bearing Restrictions: Yes LLE Weight Bearing: Weight bearing as tolerated    Mobility  Bed Mobility Overal bed mobility: Needs Assistance Bed Mobility: Supine to Sit     Supine to sit: Min assist     General bed mobility comments: Min A for LLE management and scooting hips to EOB.   Transfers Overall transfer level: Needs assistance Equipment used: Rolling walker (2 wheeled) Transfers: Sit to/from Stand Sit to Stand: Min guard         General transfer comment: Cues for hand placement to and from seated surface.  Cues for forward advancement of LLE to maintain hip precautions.  Pt with poor hand placement from stand to sit.    Ambulation/Gait Ambulation/Gait assistance: Min guard Ambulation Distance (Feet): 80 Feet Assistive device: Rolling walker (2  wheeled) Gait Pattern/deviations: Step-through pattern;Trunk flexed;Decreased stride length;Antalgic Gait velocity: Decreased   General Gait Details: Cues for upper trunk control and RW safety.  Cues to avoid IR of L hip.     Stairs            Wheelchair Mobility    Modified Rankin (Stroke Patients Only)       Balance Overall balance assessment: Needs assistance   Sitting balance-Leahy Scale: Good       Standing balance-Leahy Scale: Poor Standing balance comment: Reliant on use of RW for stability                            Cognition Arousal/Alertness: Awake/alert Behavior During Therapy: WFL for tasks assessed/performed Overall Cognitive Status: Within Functional Limits for tasks assessed                                        Exercises Total Joint Exercises Ankle Circles/Pumps: AROM;Both;10 reps;Supine Quad Sets: AROM;Left;10 reps;Supine Short Arc Quad: AROM;Left;10 reps;Supine Heel Slides: AROM;Left;10 reps;Supine Hip ABduction/ADduction: AROM;Left;10 reps;Supine    General Comments        Pertinent Vitals/Pain Pain Assessment: 0-10 Faces Pain Scale: Hurts little more Pain Location: L hip  Pain Descriptors / Indicators: Grimacing;Operative site guarding Pain Intervention(s): Monitored during session;Repositioned    Home Living  Prior Function            PT Goals (current goals can now be found in the care plan section) Acute Rehab PT Goals Patient Stated Goal: to go to rehab to get stronger Potential to Achieve Goals: Good Progress towards PT goals: Progressing toward goals    Frequency    7X/week      PT Plan Current plan remains appropriate    Co-evaluation              AM-PAC PT "6 Clicks" Daily Activity  Outcome Measure  Difficulty turning over in bed (including adjusting bedclothes, sheets and blankets)?: Total Difficulty moving from lying on back to sitting on  the side of the bed? : Total Difficulty sitting down on and standing up from a chair with arms (e.g., wheelchair, bedside commode, etc,.)?: A Lot Help needed moving to and from a bed to chair (including a wheelchair)?: A Little Help needed walking in hospital room?: A Little Help needed climbing 3-5 steps with a railing? : A Little 6 Click Score: 13    End of Session Equipment Utilized During Treatment: Gait belt Activity Tolerance: Patient tolerated treatment well Patient left: with call bell/phone within reach;in chair Nurse Communication: Mobility status PT Visit Diagnosis: Other abnormalities of gait and mobility (R26.89);Pain Pain - Right/Left: Left Pain - part of body: Hip     Time: 2563-8937 PT Time Calculation (min) (ACUTE ONLY): 28 min  Charges:  $Gait Training: 8-22 mins $Therapeutic Exercise: 8-22 mins                    G Codes:       Governor Rooks, PTA pager [phone]    Cristela Blue 06/11/2017, 3:11 PM

## 2017-06-12 ENCOUNTER — Telehealth (INDEPENDENT_AMBULATORY_CARE_PROVIDER_SITE_OTHER): Payer: Self-pay | Admitting: Orthopaedic Surgery

## 2017-06-12 DIAGNOSIS — M1990 Unspecified osteoarthritis, unspecified site: Secondary | ICD-10-CM | POA: Diagnosis not present

## 2017-06-12 DIAGNOSIS — I10 Essential (primary) hypertension: Secondary | ICD-10-CM | POA: Diagnosis not present

## 2017-06-12 DIAGNOSIS — G4700 Insomnia, unspecified: Secondary | ICD-10-CM | POA: Diagnosis not present

## 2017-06-12 DIAGNOSIS — E119 Type 2 diabetes mellitus without complications: Secondary | ICD-10-CM | POA: Diagnosis not present

## 2017-06-12 DIAGNOSIS — D62 Acute posthemorrhagic anemia: Secondary | ICD-10-CM | POA: Diagnosis not present

## 2017-06-12 DIAGNOSIS — M6281 Muscle weakness (generalized): Secondary | ICD-10-CM | POA: Diagnosis not present

## 2017-06-12 DIAGNOSIS — R41841 Cognitive communication deficit: Secondary | ICD-10-CM | POA: Diagnosis not present

## 2017-06-12 DIAGNOSIS — K5900 Constipation, unspecified: Secondary | ICD-10-CM | POA: Diagnosis not present

## 2017-06-12 DIAGNOSIS — I4891 Unspecified atrial fibrillation: Secondary | ICD-10-CM | POA: Diagnosis not present

## 2017-06-12 DIAGNOSIS — T847XXS Infection and inflammatory reaction due to other internal orthopedic prosthetic devices, implants and grafts, sequela: Secondary | ICD-10-CM | POA: Diagnosis not present

## 2017-06-12 DIAGNOSIS — M21751 Unequal limb length (acquired), right femur: Secondary | ICD-10-CM | POA: Diagnosis not present

## 2017-06-12 DIAGNOSIS — S72092D Other fracture of head and neck of left femur, subsequent encounter for closed fracture with routine healing: Secondary | ICD-10-CM | POA: Diagnosis not present

## 2017-06-12 DIAGNOSIS — I878 Other specified disorders of veins: Secondary | ICD-10-CM | POA: Diagnosis not present

## 2017-06-12 DIAGNOSIS — S72145K Nondisplaced intertrochanteric fracture of left femur, subsequent encounter for closed fracture with nonunion: Secondary | ICD-10-CM | POA: Diagnosis not present

## 2017-06-12 DIAGNOSIS — F5101 Primary insomnia: Secondary | ICD-10-CM | POA: Diagnosis not present

## 2017-06-12 DIAGNOSIS — M25551 Pain in right hip: Secondary | ICD-10-CM | POA: Diagnosis not present

## 2017-06-12 DIAGNOSIS — G894 Chronic pain syndrome: Secondary | ICD-10-CM | POA: Diagnosis not present

## 2017-06-12 DIAGNOSIS — E871 Hypo-osmolality and hyponatremia: Secondary | ICD-10-CM | POA: Diagnosis not present

## 2017-06-12 DIAGNOSIS — Z471 Aftercare following joint replacement surgery: Secondary | ICD-10-CM | POA: Diagnosis not present

## 2017-06-12 DIAGNOSIS — Z7901 Long term (current) use of anticoagulants: Secondary | ICD-10-CM | POA: Diagnosis not present

## 2017-06-12 DIAGNOSIS — D500 Iron deficiency anemia secondary to blood loss (chronic): Secondary | ICD-10-CM | POA: Diagnosis not present

## 2017-06-12 DIAGNOSIS — S72142K Displaced intertrochanteric fracture of left femur, subsequent encounter for closed fracture with nonunion: Secondary | ICD-10-CM | POA: Diagnosis not present

## 2017-06-12 DIAGNOSIS — R2681 Unsteadiness on feet: Secondary | ICD-10-CM | POA: Diagnosis not present

## 2017-06-12 DIAGNOSIS — Z4789 Encounter for other orthopedic aftercare: Secondary | ICD-10-CM | POA: Diagnosis not present

## 2017-06-12 DIAGNOSIS — F329 Major depressive disorder, single episode, unspecified: Secondary | ICD-10-CM | POA: Diagnosis not present

## 2017-06-12 DIAGNOSIS — F310 Bipolar disorder, current episode hypomanic: Secondary | ICD-10-CM | POA: Diagnosis not present

## 2017-06-12 DIAGNOSIS — E1300 Other specified diabetes mellitus with hyperosmolarity without nonketotic hyperglycemic-hyperosmolar coma (NKHHC): Secondary | ICD-10-CM | POA: Diagnosis not present

## 2017-06-12 DIAGNOSIS — D72829 Elevated white blood cell count, unspecified: Secondary | ICD-10-CM | POA: Diagnosis not present

## 2017-06-12 DIAGNOSIS — Z792 Long term (current) use of antibiotics: Secondary | ICD-10-CM | POA: Diagnosis not present

## 2017-06-12 DIAGNOSIS — E785 Hyperlipidemia, unspecified: Secondary | ICD-10-CM | POA: Diagnosis not present

## 2017-06-12 DIAGNOSIS — F339 Major depressive disorder, recurrent, unspecified: Secondary | ICD-10-CM | POA: Diagnosis not present

## 2017-06-12 DIAGNOSIS — M810 Age-related osteoporosis without current pathological fracture: Secondary | ICD-10-CM | POA: Diagnosis not present

## 2017-06-12 DIAGNOSIS — F419 Anxiety disorder, unspecified: Secondary | ICD-10-CM | POA: Diagnosis not present

## 2017-06-12 DIAGNOSIS — E039 Hypothyroidism, unspecified: Secondary | ICD-10-CM | POA: Diagnosis not present

## 2017-06-12 DIAGNOSIS — K440 Diaphragmatic hernia with obstruction, without gangrene: Secondary | ICD-10-CM | POA: Diagnosis not present

## 2017-06-12 DIAGNOSIS — I480 Paroxysmal atrial fibrillation: Secondary | ICD-10-CM | POA: Diagnosis not present

## 2017-06-12 DIAGNOSIS — R29898 Other symptoms and signs involving the musculoskeletal system: Secondary | ICD-10-CM | POA: Diagnosis not present

## 2017-06-12 DIAGNOSIS — Z96643 Presence of artificial hip joint, bilateral: Secondary | ICD-10-CM | POA: Diagnosis not present

## 2017-06-12 DIAGNOSIS — K5909 Other constipation: Secondary | ICD-10-CM | POA: Diagnosis not present

## 2017-06-12 DIAGNOSIS — R1313 Dysphagia, pharyngeal phase: Secondary | ICD-10-CM | POA: Diagnosis not present

## 2017-06-12 DIAGNOSIS — R278 Other lack of coordination: Secondary | ICD-10-CM | POA: Diagnosis not present

## 2017-06-12 DIAGNOSIS — K219 Gastro-esophageal reflux disease without esophagitis: Secondary | ICD-10-CM | POA: Diagnosis not present

## 2017-06-12 LAB — CBC
HCT: 27.4 % — ABNORMAL LOW (ref 36.0–46.0)
Hemoglobin: 8.9 g/dL — ABNORMAL LOW (ref 12.0–15.0)
MCH: 30.3 pg (ref 26.0–34.0)
MCHC: 32.5 g/dL (ref 30.0–36.0)
MCV: 93.2 fL (ref 78.0–100.0)
Platelets: 199 10*3/uL (ref 150–400)
RBC: 2.94 MIL/uL — ABNORMAL LOW (ref 3.87–5.11)
RDW: 14.1 % (ref 11.5–15.5)
WBC: 11.1 10*3/uL — ABNORMAL HIGH (ref 4.0–10.5)

## 2017-06-12 LAB — BASIC METABOLIC PANEL
Anion gap: 5 (ref 5–15)
BUN: 20 mg/dL (ref 6–20)
CALCIUM: 9.3 mg/dL (ref 8.9–10.3)
CO2: 29 mmol/L (ref 22–32)
CREATININE: 0.7 mg/dL (ref 0.44–1.00)
Chloride: 104 mmol/L (ref 101–111)
GFR calc non Af Amer: >60 mL/min (ref >60)
Glucose, Bld: 104 mg/dL — ABNORMAL HIGH (ref 65–99)
Potassium: 4.3 mmol/L (ref 3.5–5.1)
SODIUM: 138 mmol/L (ref 135–145)

## 2017-06-12 LAB — GLUCOSE, CAPILLARY
GLUCOSE-CAPILLARY: 119 mg/dL — AB (ref 65–99)
GLUCOSE-CAPILLARY: 104 mg/dL — AB (ref 65–99)
Glucose-Capillary: 94 mg/dL (ref 65–99)
Glucose-Capillary: 105 mg/dL — ABNORMAL HIGH (ref 65–99)

## 2017-06-12 MED ORDER — METHOCARBAMOL 500 MG PO TABS: 500.0000 mg | ORAL_TABLET | Freq: Four times a day (QID) | ORAL | 2 refills | Status: DC | PRN

## 2017-06-12 MED ORDER — ONDANSETRON HCL 4 MG PO TABS: 4.0000 mg | ORAL_TABLET | Freq: Three times a day (TID) | ORAL | 0 refills | Status: DC | PRN

## 2017-06-12 MED ORDER — HYDROCODONE-ACETAMINOPHEN 7.5-325 MG PO TABS: 1.0000 | ORAL_TABLET | Freq: Four times a day (QID) | ORAL | 0 refills | Status: DC | PRN

## 2017-06-12 MED ORDER — SENNOSIDES-DOCUSATE SODIUM 8.6-50 MG PO TABS: 1.0000 | ORAL_TABLET | Freq: Every evening | ORAL | 1 refills | Status: DC | PRN

## 2017-06-12 NOTE — Progress Notes (Signed)
Pt discharge instructions reviewed with patient. Patient verbalizes understanding. Patient belongings with patient. Patient is waiting for PTAR. Patient is not in distress.

## 2017-06-12 NOTE — Progress Notes (Signed)
   Subjective:  Patient reports pain as mild.  Sitting up in bed. Very cheerful attitude again  Objective:   VITALS:   Vitals:   06/11/17 0700 06/11/17 1541 06/11/17 2055 06/12/17 0610  BP: (!) 154/54 (!) 128/45 (!) 163/55 (!) 155/57  Pulse: 72 68 67 70  Resp: 18 18 18 18   Temp: 97.8 F (36.6 C) 98.2 F (36.8 C) 98 F (36.7 C) 98.5 F (36.9 C)  TempSrc: Oral Oral Oral Oral  SpO2: 92% 96% 95% 95%  Weight:      Height:        Neurologically intact Neurovascular intact Sensation intact distally Intact pulses distally Dorsiflexion/Plantar flexion intact Incision: dressing C/D/I and no drainage No cellulitis present Compartment soft   Lab Results  Component Value Date   WBC 11.1 (H) 06/12/2017   HGB 8.9 (L) 06/12/2017   HCT 27.4 (L) 06/12/2017   MCV 93.2 06/12/2017   PLT 199 06/12/2017     Assessment/Plan:  3 Days Post-Op   - patient is stable for dc back to [facility] at this point - Rx in chart  Eduard Roux 06/12/2017, 7:40 AM [phone]

## 2017-06-12 NOTE — Telephone Encounter (Signed)
PT SOCIAL WORKER AT [facility] TO ADVISE THAT THE FL2 HAS BEEN COMPLETED IF YOU COULD COSIGN IT PLEASE.  [phone] VANESSA

## 2017-06-12 NOTE — Progress Notes (Signed)
RN left voicemail for MD regarding patient discharge summary.

## 2017-06-12 NOTE — Progress Notes (Signed)
RN called SNF and gave report to [facility]

## 2017-06-12 NOTE — Progress Notes (Signed)
Physical Therapy Treatment Patient Details Name: Leah Holland MRN: [id number] DOB: 04-14-1922 Today's Date: 06/12/2017    History of Present Illness Pt is a 83 y/o female s/p L THA, posterior hip precautions, secondary to comminuted fracture of L hip. PMH includes HTN, dysrhythmias, anxiety, bipolar, DM, a fib, s/p R femur IM nail and R THA, bilat TKA.     PT Comments    Pt performed increased gait but remains to require max VCs to maintain safety and compliance with posterior hip precautions.  Pt has a tendency to internally rotate her L hip and flex forward.  Cues to correct to avoid dislocation.  Pt will continue to benefit from rehab in a post acute setting to encourage safety and carryover with hip precautions to avoid dislocation.  Plan remains appropriate for SNF placement.     Follow Up Recommendations  SNF     Equipment Recommendations  None recommended by PT    Recommendations for Other Services       Precautions / Restrictions Precautions Precautions: Posterior Hip Precaution Booklet Issued: Yes (comment) Precaution Comments: Reviewed posterior hip precautions with pt as well as supine ther ex.  Required Braces or Orthoses: Knee Immobilizer - Left Restrictions Weight Bearing Restrictions: Yes LLE Weight Bearing: Weight bearing as tolerated    Mobility  Bed Mobility               General bed mobility comments: Pt in recliner on arrival.    Transfers Overall transfer level: Needs assistance Equipment used: Rolling walker (2 wheeled) Transfers: Sit to/from Stand Sit to Stand: Min guard         General transfer comment: Cues for hand placement to and from seated surface.  Cues for forward advancement of LLE to maintain hip precautions.  Pt with poor hand placement from stand to sit.    Ambulation/Gait Ambulation/Gait assistance: Min guard Ambulation Distance (Feet): 120 Feet (x2 trials.  required lengthy seated rest break.  )   Gait  Pattern/deviations: Step-through pattern;Trunk flexed;Decreased stride length;Antalgic Gait velocity: Decreased   General Gait Details: Cues for upper trunk control and RW safety.  Cues to avoid IR of L hip.     Stairs            Wheelchair Mobility    Modified Rankin (Stroke Patients Only)       Balance     Sitting balance-Leahy Scale: Good       Standing balance-Leahy Scale: Fair                              Cognition Arousal/Alertness: Awake/alert Behavior During Therapy: WFL for tasks assessed/performed Overall Cognitive Status: Within Functional Limits for tasks assessed                                        Exercises Total Joint Exercises Hip ABduction/ADduction: AROM;Left;10 reps;Standing Knee Flexion: AROM;Left;10 reps;Standing Marching in Standing: AROM;Left;10 reps;Standing Standing Hip Extension: AROM;Left;10 reps;Standing    General Comments        Pertinent Vitals/Pain Pain Assessment: 0-10 Pain Score: 2  Pain Location: L hip  Pain Descriptors / Indicators: Grimacing;Operative site guarding Pain Intervention(s): Monitored during session;Repositioned    Home Living                      Prior Function  PT Goals (current goals can now be found in the care plan section) Acute Rehab PT Goals Patient Stated Goal: to go to rehab to get stronger Potential to Achieve Goals: Good Progress towards PT goals: Progressing toward goals    Frequency    7X/week      PT Plan Current plan remains appropriate    Co-evaluation              AM-PAC PT "6 Clicks" Daily Activity  Outcome Measure  Difficulty turning over in bed (including adjusting bedclothes, sheets and blankets)?: Total Difficulty moving from lying on back to sitting on the side of the bed? : Total Difficulty sitting down on and standing up from a chair with arms (e.g., wheelchair, bedside commode, etc,.)?: A Lot Help  needed moving to and from a bed to chair (including a wheelchair)?: A Little Help needed walking in hospital room?: A Little Help needed climbing 3-5 steps with a railing? : A Little 6 Click Score: 13    End of Session Equipment Utilized During Treatment: Gait belt Activity Tolerance: Patient tolerated treatment well Patient left: with call bell/phone within reach;in chair Nurse Communication: Mobility status PT Visit Diagnosis: Other abnormalities of gait and mobility (R26.89);Pain Pain - Right/Left: Left Pain - part of body: Hip     Time: 9242-6834 PT Time Calculation (min) (ACUTE ONLY): 30 min  Charges:  $Gait Training: 8-22 mins $Therapeutic Exercise: 8-22 mins                    G Codes:       Governor Rooks, PTA pager [phone]    Cristela Blue 06/12/2017, 9:45 AM

## 2017-06-12 NOTE — NC FL2 (Signed)
[place] LEVEL OF CARE SCREENING TOOL     IDENTIFICATION  Patient Name: Leah Holland Birthdate: 25-Nov-1922 Sex: female Admission Date (Current Location): 06/09/2017  [facility] and [place] Number:  Herbalist and Address:  [facility] [place] [address] [place], [place] [address]      Provider Number: [id number]  Attending Physician Name and Address:  Leandrew Koyanagi, MD  Relative Name and Phone Number:  [place], Klos[place] - [phone] (mobile)    Current Level of Care: SNF Recommended Level of Care: [place] ([facility]) Prior Approval Number:    Date Approved/Denied:   PASRR Number: [id number] (Eff. 03/03/12)  Discharge Plan: SNF    Current Diagnoses: Patient Active Problem List   Diagnosis Date Noted  . History of hip replacement 06/09/2017  . Ecchymosis 02/23/2017  . Closed intertrochanteric fracture with nonunion, left 01/26/2017  . Leukocytosis 01/26/2017  . Fall 01/26/2017  . Tendinopathy 08/22/2016  . Clinical depression 04/07/2016  . Arthritis of knee, degenerative 04/07/2016  . OP (osteoporosis) 04/07/2016  . 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 Chronic anticoagulation 08/06/2015  . Bipolar disorder ([place]) 08/04/2015  . Hyponatremia 08/04/2015  . Constipation 08/04/2015  . Complication of anesthesia 07/30/2015  . Diabetes mellitus type 2, diet-controlled ([place])   . Skin cancer of face   . Paroxysmal atrial fibrillation ([place]) 09/01/2014  . Right carotid bruit 11/18/2013  . Postoperative anemia due to acute blood loss 03/03/2012  . Hypertension   . Hyperlipidemia   . Swelling of extremity   . Cataract   . Hypothyroidism   . Arthritis   . Insomnia     Orientation RESPIRATION BLADDER Height & Weight     Self, Time, Situation, Place  Normal Continent Weight: 130 lb (59 kg) Height:  5\' 4"  (162.6 cm)  BEHAVIORAL SYMPTOMS/MOOD NEUROLOGICAL BOWEL NUTRITION STATUS       Continent Diet (Carb modified)  AMBULATORY STATUS COMMUNICATION OF NEEDS Skin   Limited Assist (Min guard per PT) Verbally Other (Comment) (Incision left hip)                       Personal Care Assistance Level of Assistance  Bathing, Feeding, Dressing Bathing Assistance: Limited assistance Feeding assistance: Independent Dressing Assistance: Limited assistance     Functional Limitations Info  Sight, Hearing, Speech Sight Info: Adequate Hearing Info: Impaired (Wears hearing aid) Speech Info: Adequate    SPECIAL CARE FACTORS FREQUENCY  PT (By licensed PT)     PT Frequency: Evaluated 6/30 and a minimum of 7X per week therapy recommended              Contractures Contractures Info: Not present    Additional Factors Info  Code Status, Allergies Code Status Info: Full Allergies Info: Levaquin, Morphine and related           Current Medications (06/12/2017):  This is the current hospital active medication list Current Facility-Administered Medications  Medication Dose Route Frequency Provider Last Rate Last Dose  . 0.9 %  sodium chloride infusion   Intravenous Continuous Leandrew Koyanagi, MD 20 mL/hr at 06/10/17 0935    . acetaminophen (TYLENOL) tablet 650 mg  650 mg Oral Q6H PRN Leandrew Koyanagi, MD   650 mg at 06/11/17 1455   Or  . acetaminophen (TYLENOL) suppository 650 mg  650 mg Rectal Q6H PRN Leandrew Koyanagi, MD      . alum &  mag hydroxide-simeth (MAALOX/MYLANTA) 200-200-20 MG/5ML suspension 30 mL  30 mL Oral Q4H PRN Leandrew Koyanagi, MD      . apixaban Arne Cleveland) tablet 2.5 mg  2.5 mg Oral BID Leandrew Koyanagi, MD   2.5 mg at 06/12/17 0746  . aspirin EC tablet 81 mg  81 mg Oral Daily Leandrew Koyanagi, MD   81 mg at 06/12/17 0746  . atorvastatin (LIPITOR) tablet 80 mg  80 mg Oral Daily Leandrew Koyanagi, MD   80 mg at 06/12/17 0747  . calcium carbonate (OS-CAL - dosed in mg of elemental calcium) tablet 500 mg of elemental calcium  1 tablet Oral QPM Leandrew Koyanagi, MD   500 mg of  elemental calcium at 06/11/17 1743  . cholecalciferol (VITAMIN D) tablet 2,000 Units  2,000 Units Oral QPM Leandrew Koyanagi, MD   2,000 Units at 06/11/17 1743  . diltiazem (CARDIZEM CD) 24 hr capsule 180 mg  180 mg Oral Daily Leandrew Koyanagi, MD   180 mg at 06/12/17 0746  . diphenhydrAMINE (BENADRYL) 12.5 MG/5ML elixir 25 mg  25 mg Oral Q4H PRN Leandrew Koyanagi, MD   25 mg at 06/10/17 1227  . DULoxetine (CYMBALTA) DR capsule 30 mg  30 mg Oral Daily Leandrew Koyanagi, MD   30 mg at 06/12/17 0746  . ferrous sulfate tablet 325 mg  325 mg Oral TID WC Leandrew Koyanagi, MD   325 mg at 06/11/17 1222  . furosemide (LASIX) tablet 20 mg  20 mg Oral PRN Leandrew Koyanagi, MD      . HYDROmorphone (DILAUDID) injection 0.5 mg  0.5 mg Intravenous Q2H PRN Leandrew Koyanagi, MD      . insulin aspart (novoLOG) injection 0-15 Units  0-15 Units Subcutaneous TID WC Leandrew Koyanagi, MD   2 Units at 06/11/17 1745  . insulin aspart (novoLOG) injection 0-5 Units  0-5 Units Subcutaneous QHS Leandrew Koyanagi, MD      . lactated ringers infusion   Intravenous Continuous Nolon Nations, MD   Stopped at 06/09/17 2335  . levothyroxine (SYNTHROID, LEVOTHROID) tablet 150 mcg  150 mcg Oral QAC breakfast Leandrew Koyanagi, MD   150 mcg at 06/12/17 0746  . magnesium citrate solution 1 Bottle  1 Bottle Oral Once PRN Leandrew Koyanagi, MD      . menthol-cetylpyridinium (CEPACOL) lozenge 3 mg  1 lozenge Oral PRN Leandrew Koyanagi, MD   3 mg at 06/09/17 2151   Or  . phenol (CHLORASEPTIC) mouth spray 1 spray  1 spray Mouth/Throat PRN Leandrew Koyanagi, MD      . methocarbamol (ROBAXIN) tablet 500 mg  500 mg Oral Q6H PRN Leandrew Koyanagi, MD   500 mg at 06/11/17 1055   Or  . methocarbamol (ROBAXIN) 500 mg in dextrose 5 % 50 mL IVPB  500 mg Intravenous Q6H PRN Leandrew Koyanagi, MD      . metoCLOPramide (REGLAN) tablet 5-10 mg  5-10 mg Oral Q8H PRN Leandrew Koyanagi, MD       Or  . metoCLOPramide (REGLAN) injection 5-10 mg  5-10 mg Intravenous Q8H PRN Leandrew Koyanagi, MD      . metoprolol  tartrate (LOPRESSOR) tablet 50 mg  50 mg Oral BID Leandrew Koyanagi, MD   50 mg at 06/12/17 0747  . ondansetron (ZOFRAN) tablet 4 mg  4 mg Oral Q6H PRN Leandrew Koyanagi, MD       Or  .  ondansetron (ZOFRAN) injection 4 mg  4 mg Intravenous Q6H PRN Leandrew Koyanagi, MD      . oxyCODONE (Oxy IR/ROXICODONE) immediate release tablet 5-15 mg  5-15 mg Oral Q3H PRN Leandrew Koyanagi, MD   5 mg at 06/12/17 0745  . polyethylene glycol (MIRALAX / GLYCOLAX) packet 17 g  17 g Oral Daily Leandrew Koyanagi, MD   17 g at 06/12/17 0748  . polyethylene glycol (MIRALAX / GLYCOLAX) packet 17 g  17 g Oral Daily PRN Leandrew Koyanagi, MD   17 g at 06/11/17 2312  . potassium chloride (K-DUR,KLOR-CON) CR tablet 10 mEq  10 mEq Oral Daily Leandrew Koyanagi, MD   10 mEq at 06/12/17 0746  . senna-docusate (Senokot-S) tablet 1 tablet  1 tablet Oral QHS PRN Leandrew Koyanagi, MD   1 tablet at 06/11/17 2312  . sorbitol 70 % solution 30 mL  30 mL Oral Daily PRN Leandrew Koyanagi, MD      . temazepam (RESTORIL) capsule 30 mg  30 mg Oral QHS PRN Leandrew Koyanagi, MD   30 mg at 06/11/17 2312     Discharge Medications: Please see discharge summary for a list of discharge medications.  Relevant Imaging Results:  Relevant Lab Results:   Additional Information ss#[id number]  Sable Feil, LCSW

## 2017-06-12 NOTE — Clinical Social Work Note (Signed)
Clinical Social Work Assessment  Patient Details  Name: Leah Holland MRN: [id number] Date of Birth: 07-13-1922  Date of referral:  06/12/17               Reason for consult:  Facility Placement                Permission sought to share information with:  Family Supports Permission granted to share information::  Yes, Verbal Permission Granted  Name::     [place]::     Relationship::  Son  Contact Information:  [phone]  Housing/Transportation Living arrangements for the past 2 months:  Apartment ([place]) Source of Information:  Patient, Other (Comment Required) (Admissions director at [facility]) Patient Interpreter Needed:  None Criminal Activity/Legal Involvement Pertinent to Current Situation/Hospitalization:  No - Comment as needed Significant Relationships:  Adult Children Lives with:  Self Do you feel safe going back to the place where you live?  No (Patient agreeable to ST rehab before returing home) Need for family participation in patient care:  Yes (Comment)  Care giving concerns:  Patient in agreement with rehab before returning to her apartment at [facility].  Social Worker assessment / plan:  CSW talked with patient at the bedside regarding discharge disposition and recommendation of ST rehab. Patient was sitting up in a chair and was alert, oriented and agreeable to talking with CSW. Ms. Eide in agreement with going to rehab facility at [facility].  Employment status:  Retired Health visitor, Managed Care PT Recommendations:  [place] / Referral to community resources:  [place] (None requested or needed as patient going to [place] skilled nursing facility)  Patient/Family's Response to care:  No concerns expressed by patient regarding care during hospitalization.  Patient/Family's Understanding of and Emotional Response to Diagnosis, Current  Treatment, and Prognosis:  Not discussed.  Emotional Assessment Appearance:  Appears stated age Attitude/Demeanor/Rapport:  Other (Appropriate) Affect (typically observed):  Appropriate, Pleasant Orientation:  Oriented to Self, Oriented to Place, Oriented to  Time, Oriented to Situation Alcohol / Substance use:  Never Used Psych involvement (Current and /or in the community):  No (Comment)  Discharge Needs  Concerns to be addressed:  Discharge Planning Concerns Readmission within the last 30 days:  No Current discharge risk:  None Barriers to Discharge:  No Barriers Identified   Sable Feil, LCSW 06/12/2017, 6:28 PM

## 2017-06-12 NOTE — Discharge Summary (Signed)
Physician Discharge Summary      [name] MRN: [id number] DOB/AGE: May 11, 1922 83 y.o.  Admit date: 06/09/2017 Discharge date: 06/12/2017  Admission Diagnoses:  <principal problem not specified>  Discharge Diagnoses:  Active Problems:   Closed intertrochanteric fracture with nonunion, left   History of hip replacement   Past Medical History:  Diagnosis Date  . Anxiety attack    "take RX prn" (08/13/2013)  . Arthritis    "probably" (08/13/2013)  . Atrial fibrillation ([place])    new onset/pt report 08/13/2013  . Bronchitis 12/2011  . Complication of anesthesia   . Depression   . Diabetes mellitus type 2, diet-controlled ([place])    denies  . Femur fracture (H[facility])    left proximal femur non-union  . GERD (gastroesophageal reflux disease)   . H/O hiatal hernia   . Hardware complicating wound infection ([place]) 08/22/2016  . History of blood transfusion    "w/both knee OR's" (08/13/2013)  . Hyperlipidemia   . Hyperlipidemia   . Hypertension   . Hypothyroidism    takes synthroid  . Insomnia   . Intertrochanteric fracture of right hip ([place]) 07/30/2015  . Neuromuscular disorder ([place])    carpal tunnel in right hand  . Periprosthetic fracture around internal prosthetic right hip joint ([place]), nonunion basicervical femoral neck with broken hardware and subtrochanteric fracture 03/16/2016  . PONV (postoperative nausea and vomiting)   . Prosthetic hip infection ([place]) 08/22/2016  . Proteus infection 08/22/2016  . Serratia infection 11/14/2016  . Skin cancer of face 2000's   "one on each side of my face and one on my nose" (08/13/2013)  . Swelling of extremity    sees Dr. Gwenlyn Found, cardiologist 1 x year [phone]  . Tendon dysfunction 08/22/2016  . Wears hearing aid    HOH    Surgeries: Procedure(s): REMOVE LEFT HIP INTRAMEDULLARY NAIL AND CONVERT TO LEFT CEMENTED HEMI HIP ARTHROPLASTY on 06/09/2017   Consultants (if any):   Discharged Condition: Improved  Hospital Course:  Leah Holland is an 83 y.o. female who was admitted 06/09/2017 with a diagnosis of <principal problem not specified> and went to the operating room on 06/09/2017 and underwent the above named procedures.    She was given perioperative antibiotics:  Anti-infectives    Start     Dose/Rate Route Frequency Ordered Stop   06/09/17 2100  ceFAZolin (ANCEF) IVPB 2g/100 mL premix     2 g 200 mL/hr over 30 Minutes Intravenous Every 6 hours 06/09/17 1819 06/10/17 0420    .  She was given sequential compression devices, early ambulation, and eliquis for DVT prophylaxis.  She benefited maximally from the hospital stay and there were no complications.    Recent vital signs:  Vitals:   06/11/17 2055 06/12/17 0610  BP: (!) 163/55 (!) 155/57  Pulse: 67 70  Resp: 18 18  Temp: 98 F (36.7 C) 98.5 F (36.9 C)    Recent laboratory studies:  Lab Results  Component Value Date   HGB 8.9 (L) 06/12/2017   HGB 8.2 (L) 06/11/2017   HGB 9.5 (L) 06/10/2017   Lab Results  Component Value Date   WBC 11.1 (H) 06/12/2017   PLT 199 06/12/2017   Lab Results  Component Value Date   INR 1.03 06/09/2017   Lab Results  Component Value Date   NA 138 06/12/2017   K 4.3 06/12/2017   CL 104 06/12/2017   CO2 29 06/12/2017   BUN 20 06/12/2017   CREATININE 0.70  06/12/2017   GLUCOSE 104 (H) 06/12/2017    Discharge Medications:   Allergies as of 06/12/2017      Reactions   Levaquin [levofloxacin] Other (See Comments)   tendinopathy   Morphine And Related Other (See Comments)   Made pt "wild"      Medication List    TAKE these medications   acetaminophen 325 MG tablet Commonly known as:  TYLENOL Take 650 mg by mouth every 6 (six) hours as needed for moderate pain.   aspirin EC 81 MG tablet Take 81 mg by mouth daily.   atorvastatin 80 MG tablet Commonly known as:  LIPITOR Take 80 mg by mouth daily.   calcium carbonate 1250 (500 Ca) MG tablet Commonly known as:  OS-CAL - dosed in mg of  elemental calcium Take 1 tablet by mouth every evening.   Cholecalciferol 2000 units Caps Take 2,000 Units by mouth every evening.   denosumab 60 MG/ML Soln injection Commonly known as:  PROLIA Inject 60 mg into the skin every 6 (six) months. Administer in upper arm, thigh, or abdomen   diltiazem 180 MG 24 hr capsule Commonly known as:  CARDIZEM CD Take 1 capsule (180 mg total) by mouth daily.   DULoxetine 30 MG capsule Commonly known as:  CYMBALTA Take 30 mg by mouth daily.   ELIQUIS 2.5 MG Tabs tablet Generic drug:  apixaban TAKE 1 TABLET TWICE DAILY.   esomeprazole 20 MG capsule Commonly known as:  NEXIUM Take 20 mg by mouth daily at 12 noon.   ferrous sulfate 325 (65 FE) MG tablet Take 1 tablet (325 mg total) by mouth 3 (three) times daily with meals.   Flaxseed Oil 1000 MG Caps Take 1,000 mg by mouth. Take one capsule twice a day   furosemide 20 MG tablet Commonly known as:  LASIX Take 1 tablet (20 mg total) by mouth as needed for edema.   HYDROcodone-acetaminophen 7.5-325 MG tablet Commonly known as:  NORCO Take 1-2 tablets by mouth every 6 (six) hours as needed for moderate pain. What changed:  Another medication with the same name was added. Make sure you understand how and when to take each.   HYDROcodone-acetaminophen 7.5-325 MG tablet Commonly known as:  NORCO Take 1-2 tablets by mouth every 6 (six) hours as needed for moderate pain. What changed:  You were already taking a medication with the same name, and this prescription was added. Make sure you understand how and when to take each.   levothyroxine 150 MCG tablet Commonly known as:  SYNTHROID, LEVOTHROID Take 150 mcg by mouth daily before breakfast.   methocarbamol 500 MG tablet Commonly known as:  ROBAXIN Take 1 tablet (500 mg total) by mouth every 8 (eight) hours as needed for muscle spasms. What changed:  Another medication with the same name was added. Make sure you understand how and when to  take each.   methocarbamol 500 MG tablet Commonly known as:  ROBAXIN Take 1 tablet (500 mg total) by mouth every 6 (six) hours as needed for muscle spasms. What changed:  You were already taking a medication with the same name, and this prescription was added. Make sure you understand how and when to take each.   metoprolol tartrate 50 MG tablet Commonly known as:  LOPRESSOR Take 50 mg by mouth 2 (two) times daily.   multivitamin tablet Take 1 tablet by mouth. Take one tablet twice daily   ondansetron 4 MG tablet Commonly known as:  ZOFRAN Take 1-2 tablets (  4-8 mg total) by mouth every 8 (eight) hours as needed for nausea or vomiting.   polyethylene glycol packet Commonly known as:  MIRALAX / GLYCOLAX Take 17 g by mouth daily.   potassium chloride 10 MEQ tablet Commonly known as:  K-DUR Take 10 mEq by mouth. Take one tablet as needed when giving lasix as needed   senna-docusate 8.6-50 MG tablet Commonly known as:  Senokot-S Take 1 tablet by mouth at bedtime as needed for mild constipation. What changed:  Another medication with the same name was added. Make sure you understand how and when to take each.   senna-docusate 8.6-50 MG tablet Commonly known as:  SENOKOT S Take 1 tablet by mouth at bedtime as needed. What changed:  You were already taking a medication with the same name, and this prescription was added. Make sure you understand how and when to take each.   sulfamethoxazole-trimethoprim 400-80 MG tablet Commonly known as:  BACTRIM Take 1 tablet by mouth 2 (two) times daily.   temazepam 30 MG capsule Commonly known as:  RESTORIL Take 1 capsule (30 mg total) by mouth at bedtime as needed for sleep.            Durable Medical Equipment        Start     Ordered   06/09/17 2303  DME Walker rolling  Once    Question:  Patient needs a walker to treat with the following condition  Answer:  History of hip replacement   06/09/17 2303   06/09/17 2303  DME 3 n 1   Once     06/09/17 2303   06/09/17 2303  DME Bedside commode  Once    Question:  Patient needs a bedside commode to treat with the following condition  Answer:  History of hip replacement   06/09/17 2303      Diagnostic Studies: Dg Pelvis Portable  Result Date: 06/09/2017 CLINICAL DATA:  Status post left hip arthroplasty. EXAM: PORTABLE PELVIS 1-2 VIEWS COMPARISON:  06/06/2017 CT FINDINGS: Left hip arthroplasty now noted. No dislocation or complicating features. Right hip arthroplasty changes again identified. IMPRESSION: Left hip arthroplasty changes without complicating features. Electronically Signed   By: Margarette Canada M.D.   On: 06/09/2017 18:49   Ct Hip Left Wo Contrast  Result Date: 05/29/2017 CLINICAL DATA:  Left hip fracture January 25, 2017. EXAM: CT OF THE LEFT HIP WITHOUT CONTRAST TECHNIQUE: Multidetector CT imaging of the left hip was performed according to the standard protocol. Multiplanar CT image reconstructions were also generated. COMPARISON:  X-ray pelvis 02/24/2017, 04/04/2017 FINDINGS: Bones/Joint/Cartilage Ununited left intertrochanteric fracture transfixed with a intramedullary nail and 2 femoral neck screws without failure complication. No significant callus formation. Mild heterotopic ossification adjacent to the greater trochanter. No other fracture or dislocation. Mild osteoarthritis of the left hip. Normal alignment. No joint effusion. Ligaments Ligaments are suboptimally evaluated by CT. Muscles and Tendons Muscles are normal. No significant muscle atrophy. No intramuscular fluid collection or hematoma. Soft tissue No fluid collection or hematoma. No soft tissue mass. Peripheral vascular atherosclerotic disease. IMPRESSION: 1. Ununited left intertrochanteric fracture transfixed with a intramedullary nail and 2 femoral neck screws without failure complication. No significant callus formation. Electronically Signed   By: Kathreen Devoid   On: 05/29/2017 14:56   Ct Femur Left Wo  Contrast  Result Date: 06/06/2017 CLINICAL DATA:  Fracture of distal interlocking screw. EXAM: CT OF THE LOWER LEFT EXTREMITY WITHOUT CONTRAST TECHNIQUE: Multidetector CT imaging of the lower left  extremity was performed according to the standard protocol. COMPARISON:  Radiographs dated 05/29/2017 and 01/26/2017 FINDINGS: Bones/Joint/Cartilage There is a fracture of the proximal portion of the intramedullary nail at the site of the 2 screws in the femoral head and neck. There is also a fracture of the distal locking screw. There is increased angulation at the intertrochanteric fracture as compared to the immediate postoperative image. There is no loosening of the screws or rod. IMPRESSION: 1. Fracture of the proximal portion of the intramedullary nail at the site of the 2 compression screws. 2. Fracture of the distal interlocking screw. 3. Increased angulation of the intertrochanteric fracture. Electronically Signed   By: Lorriane Shire M.D.   On: 06/06/2017 11:23   Dg C-arm 1-60 Min-no Report  Result Date: 06/09/2017 Fluoroscopy was utilized by the requesting physician.  No radiographic interpretation.   Xr Femur Min 2 Views Left  Result Date: 05/29/2017 Interval breakage of distal interlocking screw. No evidence of lag screw cut out. Proximal portion of the nail appears to be slightly bent into varus. No evidence of nonunion.   Disposition: 03-Skilled Nursing Facility  Discharge Instructions    Call MD / Call 911    Complete by:  As directed    If you experience chest pain or shortness of breath, CALL 911 and be transported to the hospital emergency room.  If you develope a fever above 101.5 F, pus (white drainage) or increased drainage or redness at the wound, or calf pain, call your surgeon's office.   Constipation Prevention    Complete by:  As directed    Drink plenty of fluids.  Prune juice may be helpful.  You may use a stool softener, such as Colace (over the counter) 100 mg twice a  day.  Use MiraLax (over the counter) for constipation as needed.   Driving restrictions    Complete by:  As directed    No driving while taking narcotic pain meds.   Increase activity slowly as tolerated    Complete by:  As directed       Follow-up Information    Leandrew Koyanagi, MD Follow up in 2 week(s).   Specialty:  Orthopedic Surgery Why:  For suture removal, For wound re-check Contact information: [place] [phone]            Signed: Eduard Roux 06/12/2017, 3:39 PM

## 2017-06-12 NOTE — Progress Notes (Signed)
RN left voicemail for social worker regarding patient discharge to [place].

## 2017-06-12 NOTE — Telephone Encounter (Signed)
PT SOCIAL WORKER AT [place] TO ADVISE THAT THE FL2 HAS BEEN COMPLETED IF YOU COULD COSIGN IT PLEASE.  [phone] Leah Holland

## 2017-06-12 NOTE — Clinical Social Work Placement (Signed)
   CLINICAL SOCIAL WORK PLACEMENT  NOTE 06/12/17 - DISCHARGED TO [facility]  Date:  06/12/2017  Patient Details  Name: Leah Holland MRN: [id number] Date of Birth: July 28, 1922  Clinical Social Work is seeking post-discharge placement for this patient at the [place] level of care (*CSW will initial, date and re-position this form in  chart as items are completed):  No (Patient from [place] and going to their rehab facility)   Patient/family provided with [facility] Work Department's list of facilities offering this level of care within the geographic area requested by the patient (or if unable, by the patient's family).  No   Patient/family informed of their freedom to choose among providers that offer the needed level of care, that participate in Medicare, Medicaid or managed care program needed by the patient, have an available bed and are willing to accept the patient.  No   Patient/family informed of [facility]'s ownership interest in [facility] and [facility], as well as of the fact that they are under no obligation to receive care at these facilities.  PASRR submitted to EDS on       PASRR number received on       Existing PASRR number confirmed on 06/12/17     FL2 transmitted to all facilities in geographic area requested by pt/family on 06/12/17     FL2 transmitted to all facilities within larger geographic area on       Patient informed that his/her managed care company has contracts with or will negotiate with certain facilities, including the following:        Yes   Patient/family informed of bed offers received.  Patient chooses bed at [facility]     Physician recommends and patient chooses bed at      Patient to be transferred to [facility] on 06/12/17.  Patient to be transferred to facility by [facility]     Patient family notified on 06/12/17 of transfer.  Name  of family member notified:  Stevie Ertle - son.  [phone]     PHYSICIAN       Additional Comment:    _______________________________________________ Sable Feil, LCSW 06/12/2017, 6:32 PM

## 2017-06-12 NOTE — Clinical Social Work Note (Addendum)
Patient medically stable for discharge to [facility] skilled nursing facility. [name] is from [place]. [facility] contacted and advised of patient's need for rehab and they can accept her in rehab facility. Discharge clinicals transmitted to facility. Patient will be transported by ambulance. Call made to MD office and message left with staff requesting MD be contacted and asked to co-sign FL-2. Call made to patient's son Leah Holland [phone]) and informed him of ambulance transport. Son at facility waiting for patient.  Nurse will call report to to facility - [phone] and ask for patient's nurse.  Leah Holland, MSW, LCSW Licensed Clinical Social Worker [place] [phone]

## 2017-06-13 ENCOUNTER — Non-Acute Institutional Stay (SKILLED_NURSING_FACILITY): Payer: Medicare Other | Admitting: Internal Medicine

## 2017-06-13 ENCOUNTER — Encounter (HOSPITAL_COMMUNITY): Payer: Self-pay | Admitting: Orthopaedic Surgery

## 2017-06-13 DIAGNOSIS — E785 Hyperlipidemia, unspecified: Secondary | ICD-10-CM

## 2017-06-13 DIAGNOSIS — S72142K Displaced intertrochanteric fracture of left femur, subsequent encounter for closed fracture with nonunion: Secondary | ICD-10-CM

## 2017-06-13 DIAGNOSIS — D500 Iron deficiency anemia secondary to blood loss (chronic): Secondary | ICD-10-CM | POA: Diagnosis not present

## 2017-06-13 DIAGNOSIS — I10 Essential (primary) hypertension: Secondary | ICD-10-CM

## 2017-06-13 DIAGNOSIS — G4700 Insomnia, unspecified: Secondary | ICD-10-CM

## 2017-06-13 DIAGNOSIS — I480 Paroxysmal atrial fibrillation: Secondary | ICD-10-CM

## 2017-06-13 DIAGNOSIS — D72829 Elevated white blood cell count, unspecified: Secondary | ICD-10-CM | POA: Diagnosis not present

## 2017-06-13 DIAGNOSIS — Z792 Long term (current) use of antibiotics: Secondary | ICD-10-CM

## 2017-06-13 DIAGNOSIS — F32A Depression, unspecified: Secondary | ICD-10-CM

## 2017-06-13 DIAGNOSIS — F329 Major depressive disorder, single episode, unspecified: Secondary | ICD-10-CM | POA: Diagnosis not present

## 2017-06-13 DIAGNOSIS — K5900 Constipation, unspecified: Secondary | ICD-10-CM

## 2017-06-13 DIAGNOSIS — I878 Other specified disorders of veins: Secondary | ICD-10-CM | POA: Diagnosis not present

## 2017-06-13 DIAGNOSIS — R2681 Unsteadiness on feet: Secondary | ICD-10-CM | POA: Diagnosis not present

## 2017-06-13 DIAGNOSIS — E039 Hypothyroidism, unspecified: Secondary | ICD-10-CM

## 2017-06-13 LAB — BASIC METABOLIC PANEL: Glucose: 95

## 2017-06-13 NOTE — Progress Notes (Signed)
Provider:  Blanchie Serve MD  Location:  [facility] Room Number: 56 Place of Service:  SNF (31)  PCP: Blanchie Serve, MD Patient Care Team: Blanchie Serve, MD as PCP - General (Internal Medicine) Earlie Server, MD as Consulting Physician (Orthopedic Surgery) Lorretta Harp, MD as Consulting Physician (Cardiology) Calvert Cantor, MD as Consulting Physician (Ophthalmology) [name], MD as Attending Physician [facility] Medicine)  Extended Emergency Contact Information Primary Emergency Contact: Ponciano Ort Address: [address]          [place], [place] [address] Johnnette Litter of [place] Phone: [phone] Relation: Son Secondary Emergency Contact: [place], [place] [address] Johnnette Litter of [place] Phone: [phone] Relation: Son  Code Status: Full Code Goals of Care: Advanced Directive information Advanced Directives 06/09/2017  Does Patient Have a Medical Advance Directive? Yes  Type of Advance Directive Living will;Healthcare Power of Attorney  Does patient want to make changes to medical advance directive? No - Patient declined  Copy of [place] in Chart? No - copy requested  Would patient like information on creating a medical advance directive? -  Pre-existing out of facility DNR order (yellow form or pink MOST form) -      Chief Complaint  Patient presents with  . New Admit To SNF    New Admission Visit     HPI: Patient is a 83 y.o. female seen today for admission to SNF. She was in the hospital from 06/09/17-06/12/17 with closed left intertrochanteric fracture with nonunion. She had undergone left hip replacement surgery in 01/2017. She underwent removal of left hip IM nail and left cemented hemi-hip arthroplasty. She is seen in her room today. She was residing in independent living facility prior to this hospital admission. She has medical history of afib on  anticoagulation, HTN, HLD, gerd, type 2 DM among others.   Past Medical History:  Diagnosis Date  . Anxiety attack    "take RX prn" (08/13/2013)  . Arthritis    "probably" (08/13/2013)  . Atrial fibrillation ([place])    new onset/pt report 08/13/2013  . Bronchitis 12/2011  . Complication of anesthesia   . Depression   . Diabetes mellitus type 2, diet-controlled ([place])    denies  . Femur fracture (H[facility])    left proximal femur non-union  . GERD (gastroesophageal reflux disease)   . H/O hiatal hernia   . Hardware complicating wound infection ([place]) 08/22/2016  . History of blood transfusion    "w/both knee OR's" (08/13/2013)  . Hyperlipidemia   . Hyperlipidemia   . Hypertension   . Hypothyroidism    takes synthroid  . Insomnia   . Intertrochanteric fracture of right hip ([place]) 07/30/2015  . Neuromuscular disorder ([place])    carpal tunnel in right hand  . Periprosthetic fracture around internal prosthetic right hip joint ([place]), nonunion basicervical femoral neck with broken hardware and subtrochanteric fracture 03/16/2016  . PONV (postoperative nausea and vomiting)   . 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 Proteus infection 08/22/2016  . Serratia infection 11/14/2016  . Skin cancer of face 2000's   "one on each side of my face and one on my nose" (08/13/2013)  . Swelling of extremity    sees Dr. Gwenlyn Found, cardiologist 1 x year [phone]  . Tendon dysfunction 08/22/2016  . Wears hearing aid    [facility]   Past Surgical History:  Procedure Laterality Date  . 2D  ECHOCARDIOGRAM  12/07/2004   EF 48%, moderate pulmonary hypertension  . BREAST BIOPSY Left ?1970's  . CARDIOVASCULAR STRESS TEST  12/07/11  . CARDIOVASCULAR STRESS TEST  12/07/2011   LEXISCAN, normal scan, no significant wall abnormalities noted  . CARPAL TUNNEL RELEASE Right ~ 2005  . CATARACT EXTRACTION, BILATERAL Bilateral 2000  . DILATION AND CURETTAGE OF UTERUS  1950's  . DOPPLER ECHOCARDIOGRAPHY  12/07/11  . EYE SURGERY    .  FEMUR IM NAIL Right 08/01/2015   Procedure: INTRAMEDULLARY (IM) NAIL FEMORAL;  Surgeon: Marchia Bond, MD;  Location: [facility];  Service: Orthopedics;  Laterality: Right;  . HARDWARE REMOVAL Right 03/16/2016   Procedure: HARDWARE REMOVAL;  Surgeon: Marchia Bond, MD;  Location: [facility];  Service: Orthopedics;  Laterality: Right;  . INTRAMEDULLARY (IM) NAIL INTERTROCHANTERIC Left 01/26/2017   Procedure: INTRAMEDULLARY (IM) NAIL INTERTROCHANTRIC;  Surgeon: Leandrew Koyanagi, MD;  Location: [facility];  Service: Orthopedics;  Laterality: Left;  . JOINT REPLACEMENT     bilateral knee replacement  . SKIN CANCER EXCISION  2000's   "off my nose" (08/13/2013)  . TOTAL HIP ARTHROPLASTY Right 03/16/2016   Procedure: TOTAL HIP ARTHROPLASTY;  Surgeon: Marchia Bond, MD;  Location: [facility];  Service: Orthopedics;  Laterality: Right;  . TOTAL HIP ARTHROPLASTY Left 06/09/2017   Procedure: REMOVE LEFT HIP INTRAMEDULLARY NAIL AND CONVERT TO LEFT CEMENTED HEMI HIP ARTHROPLASTY;  Surgeon: Leandrew Koyanagi, MD;  Location: [facility];  Service: Orthopedics;  Laterality: Left;  . TOTAL KNEE ARTHROPLASTY Right 2005  . TOTAL KNEE ARTHROPLASTY  03/02/2012   Procedure: TOTAL KNEE ARTHROPLASTY;  Surgeon: Yvette Rack., MD;  Location: [facility];  Service: Orthopedics;  Laterality: Left;    reports that she has never smoked. She has never used smokeless tobacco. She reports that she does not drink alcohol or use drugs. Social History   Social History  . Marital status: Widowed    Spouse name: N/A  . Number of children: N/A  . Years of education: N/A   Occupational History  . Not on file.   Social History Main Topics  . Smoking status: Never Smoker  . Smokeless tobacco: Never Used  . Alcohol use No  . Drug use: No  . Sexual activity: No   Other Topics Concern  . Not on file   Social History Narrative   Widowed. Lives at [facility]. Son died in the [place] War and she has not slept well since then.   Admitted to [facility]  01/30/17   Never smoked   Alcohol none   Full Code    Functional Status Survey:    Family History  Problem Relation Age of Onset  . Arrhythmia Mother   . Heart disease Mother   . Arrhythmia Sister   . Stroke Father 34  . Heart disease Brother   . Heart disease Brother   . Cancer Brother        Colon cancer  . Arrhythmia Brother   . Heart disease Brother   . Heart disease Sister   . Heart disease Sister   . Stroke Sister   . Diabetes Sister   . Heart disease Child 67  . Heart disease Child 33    Health Maintenance  Topic Date Due  . FOOT EXAM  09/10/1934  . OPHTHALMOLOGY EXAM  09/10/1934  . URINE MICROALBUMIN  09/10/1934  . TETANUS/TDAP  09/11/1943  . DEXA SCAN  09/10/1989  . PNA vac Low Risk Adult (1  of 2 - PCV13) 09/10/1989  . HEMOGLOBIN A1C  02/09/2016  . INFLUENZA VACCINE  07/12/2017    Allergies  Allergen Reactions  . Levaquin [Levofloxacin] Other (See Comments)    tendinopathy  . Morphine And Related Other (See Comments)    Made pt "wild"    Outpatient Encounter Prescriptions as of 06/13/2017  Medication Sig  . acetaminophen (TYLENOL) 325 MG tablet Take 650 mg by mouth every 6 (six) hours as needed for moderate pain.   Marland Kitchen aspirin EC 81 MG tablet Take 81 mg by mouth daily.  Marland Kitchen atorvastatin (LIPITOR) 80 MG tablet Take 80 mg by mouth daily.  . calcium carbonate (OS-CAL - DOSED IN MG OF ELEMENTAL CALCIUM) 1250 (500 Ca) MG tablet Take 1 tablet by mouth every evening.  . Cholecalciferol 2000 units CAPS Take 2,000 Units by mouth every evening.  . denosumab (PROLIA) 60 MG/ML SOLN injection Inject 60 mg into the skin every 6 (six) months. Administer in upper arm, thigh, or abdomen  . diltiazem (CARDIZEM CD) 180 MG 24 hr capsule Take 1 capsule (180 mg total) by mouth daily.  . DULoxetine (CYMBALTA) 30 MG capsule Take 30 mg by mouth daily.  Marland Kitchen ELIQUIS 2.5 MG TABS tablet TAKE 1 TABLET TWICE DAILY.  Marland Kitchen esomeprazole (NEXIUM) 20 MG capsule Take 20 mg by mouth daily at 12  noon.  . ferrous sulfate 325 (65 FE) MG tablet Take 1 tablet (325 mg total) by mouth 3 (three) times daily with meals.  . Flaxseed, Linseed, (FLAXSEED OIL) 1000 MG CAPS Take 1,000 mg by mouth 2 (two) times daily.   . furosemide (LASIX) 20 MG tablet Take 1 tablet (20 mg total) by mouth as needed for edema.  Marland Kitchen HYDROcodone-acetaminophen (NORCO) 7.5-325 MG tablet Take 1-2 tablets by mouth every 6 (six) hours as needed for moderate pain.  Marland Kitchen levothyroxine (SYNTHROID, LEVOTHROID) 150 MCG tablet Take 150 mcg by mouth daily before breakfast.  . methocarbamol (ROBAXIN) 500 MG tablet Take 1 tablet (500 mg total) by mouth every 8 (eight) hours as needed for muscle spasms.  . metoprolol (LOPRESSOR) 50 MG tablet Take 50 mg by mouth 2 (two) times daily.  . Multiple Vitamins-Minerals (MULTIVITAMIN) tablet Take 1 tablet by mouth 2 (two) times daily.   . ondansetron (ZOFRAN) 4 MG tablet Take 4 mg by mouth every 8 (eight) hours as needed for nausea or vomiting.  . polyethylene glycol (MIRALAX / GLYCOLAX) packet Take 17 g by mouth daily.  . potassium chloride (K-DUR) 10 MEQ tablet Take 10 mEq by mouth. Take one tablet as needed when giving lasix as needed  . saccharomyces boulardii (FLORASTOR) 250 MG capsule Take 250 mg by mouth 2 (two) times daily.  Marland Kitchen senna-docusate (SENOKOT S) 8.6-50 MG tablet Take 1 tablet by mouth at bedtime as needed.  . sulfamethoxazole-trimethoprim (BACTRIM) 400-80 MG tablet Take 1 tablet by mouth 2 (two) times daily.  . temazepam (RESTORIL) 30 MG capsule Take 1 capsule (30 mg total) by mouth at bedtime as needed for sleep.  . [DISCONTINUED] HYDROcodone-acetaminophen (NORCO) 7.5-325 MG tablet Take 1-2 tablets by mouth every 6 (six) hours as needed for moderate pain.  . [DISCONTINUED] methocarbamol (ROBAXIN) 500 MG tablet Take 1 tablet (500 mg total) by mouth every 6 (six) hours as needed for muscle spasms.  . [DISCONTINUED] ondansetron (ZOFRAN) 4 MG tablet Take 1-2 tablets (4-8 mg total) by  mouth every 8 (eight) hours as needed for nausea or vomiting.  . [DISCONTINUED] senna-docusate (SENOKOT-S) 8.6-50 MG tablet Take 1  tablet by mouth at bedtime as needed for mild constipation.   No facility-administered encounter medications on file as of 06/13/2017.     Review of Systems  Constitutional: Negative for activity change, appetite change, chills and fever.  HENT: Positive for hearing loss. Negative for congestion, mouth sores, rhinorrhea, sore throat and trouble swallowing.   Eyes:       Wears glasses   Respiratory: Negative for cough, shortness of breath and wheezing.   Cardiovascular: Negative for chest pain, palpitations and leg swelling.  Gastrointestinal: Negative for abdominal pain, blood in stool, constipation, diarrhea, nausea and vomiting.       Last bowel movement was this morning. Has GERD but is managed with medication  Genitourinary: Negative for dysuria and frequency.  Musculoskeletal: Positive for gait problem. Negative for back pain.       Has history of fracture and replacement of right hip and left hip. Pain medication is helping with her pain.   Skin: Negative for pallor and rash.  Neurological: Positive for dizziness. Negative for tremors, seizures, syncope, light-headedness and headaches.       Occasional dizziness present and slow position change helps  Hematological: Does not bruise/bleed easily.  Psychiatric/Behavioral: Negative for agitation, behavioral problems and confusion.    Vitals:   06/13/17 1155  BP: 130/62  Pulse: 64  Resp: 20  Temp: 98.2 F (36.8 C)  TempSrc: Oral  Weight: 130 lb (59 kg)  Height: 5\' 4"  (1.626 m)   Body mass index is 22.31 kg/m. Physical Exam  Constitutional: She is oriented to person, place, and time. She appears well-developed and well-nourished. No distress.  HENT:  Head: Normocephalic and atraumatic.  Mouth/Throat: Oropharynx is clear and moist.  Hearing aid present, has dentures  Eyes: Conjunctivae and EOM  are normal. Pupils are equal, round, and reactive to light.  Neck: Normal range of motion. Neck supple. No thyromegaly present.  Cardiovascular: Normal rate, regular rhythm and normal heart sounds.   Pulmonary/Chest: Effort normal and breath sounds normal.  Abdominal: Soft. Bowel sounds are normal. There is no tenderness. There is no guarding.  Musculoskeletal:  Can move all 4 extremities, ROM with left hip limited, able to bear weight to both LE, contact guard assist with transfers, using a walker with gait belt with therapy, trace leg edema. Intact dorsalis pedis. Soft compartment. Plantar flexion and dorsiflexion intact to both.   Lymphadenopathy:    She has no cervical adenopathy.  Neurological: She is alert and oriented to person, place, and time.  Skin: Skin is warm and dry. No rash noted. She is not diaphoretic.  Surgical incision to left thigh with mepilex dressing clean and dry. Dressing to left lateral knee clean and dry  Psychiatric: She has a normal mood and affect. Her behavior is normal.    Labs reviewed: Basic Metabolic Panel:  Recent Labs  01/27/17 0724 01/28/17 0508  06/10/17 0350 06/11/17 0339 06/12/17 0500  NA 134* 133*  < > 134* 133* 138  K 3.7 3.9  < > 4.3 4.8 4.3  CL 98* 98*  < > 102 101 104  CO2 26 28  < > 27 28 29   GLUCOSE 122* 122*  < > 168* 150* 104*  BUN 20 26*  < > 10 26* 20  CREATININE 1.11* 1.41*  < > 0.74 1.04* 0.70  CALCIUM 8.6* 8.9  < > 8.4* 8.5* 9.3  MG 1.3* 1.7  --   --   --   --   < > =  values in this interval not displayed. Liver Function Tests:  Recent Labs  09/12/16 1510 02/28/17  AST 23 12*  ALT 29 9  ALKPHOS 83 109  BILITOT 0.7  --   PROT 5.9*  --   ALBUMIN 4.0  --    No results for input(s): LIPASE, AMYLASE in the last 8760 hours. No results for input(s): AMMONIA in the last 8760 hours. CBC:  Recent Labs  01/25/17 2130  03/27/17 1128 06/07/17 1035  06/10/17 0350 06/11/17 0339 06/12/17 0500  WBC 15.3*  < > 6.8 4.6  --   9.2 14.9* 11.1*  NEUTROABS 12.9*  --  3,740 2,668  --   --   --   --   HGB 12.3  < > 13.8 12.9  < > 9.5* 8.2* 8.9*  HCT 36.7  < > 42.7 39.4  < > 29.4* 25.6* 27.4*  MCV 92.7  < > 97.7 91.8  --  93.0 93.1 93.2  PLT 229  < > 220 263  --  210 211 199  < > = values in this interval not displayed. Cardiac Enzymes: No results for input(s): CKTOTAL, CKMB, CKMBINDEX, TROPONINI in the last 8760 hours. BNP: Invalid input(s): POCBNP Lab Results  Component Value Date   HGBA1C 5.9 08/10/2015   Lab Results  Component Value Date   TSH 2.304 03/16/2016   Lab Results  Component Value Date   VITAMINB12 512 08/03/2015   Lab Results  Component Value Date   FOLATE 20.5 08/03/2015   Lab Results  Component Value Date   IRON 16 (L) 08/03/2015   TIBC 238 (L) 08/03/2015   FERRITIN 151 08/03/2015    Imaging and Procedures obtained prior to SNF admission: Dg Pelvis Portable  Result Date: 06/09/2017 CLINICAL DATA:  Status post left hip arthroplasty. EXAM: PORTABLE PELVIS 1-2 VIEWS COMPARISON:  06/06/2017 CT FINDINGS: Left hip arthroplasty now noted. No dislocation or complicating features. Right hip arthroplasty changes again identified. IMPRESSION: Left hip arthroplasty changes without complicating features. Electronically Signed   By: Margarette Canada M.D.   On: 06/09/2017 18:49   Dg C-arm 1-60 Min-no Report  Result Date: 06/09/2017 Fluoroscopy was utilized by the requesting physician.  No radiographic interpretation.    Assessment/Plan   Unsteady gait With recent left hip fracture. Will have her work with physical therapy and occupational therapy team to help with gait training and muscle strengthening exercises.fall precautions. Skin care. Encourage to be out of bed.   Left hip fracture S/p removal of IM nail and cemented hemi arthroplasty has been done. Will need orthopedic follow up. Continue eliquis for dvt prophylaxis. Will have patient work with PT/OT as tolerated to regain strength and  restore function.  Fall precautions are in place. Continue robaxin 500 mg q8h prn muscle spasm and norco 7.5-325 mg 1-2 tab q6h prn pain.   Blood loss anemia Post op, does not want to take iron supplement, no bleed reported, monitor cbc  Leukocytosis No signs of infection, likely reactive, monitor wbc curve and temp curve  Hyperlipidemia Continue atorvastatin and monitor.   Hypertension Continue metoprolol 50 mg bid and monitor BP  gerd Continue esomeprazole, monitor symptom  Constipation Continue daily miralax and as needed senna, monitor, maintain hydration  Osteoporosis Continue denosumab q6 months and monitor  afib Controlled HR, continue diltiazem 180 mg daily and eliquis for stroke prophylaxis  Insomnia On prn temazepam, monitor clinically  Venous stasis Continue lasix 20 mg daily as needed, currently has ted hose  Hypothyroidism Continue  levothyroxine and monitor  Depression Continue duloxetine, no changes made  chronic antibiotic supression On chronic suppressive therapy to help prevent prosthetic joint infection. Continue bactrim and florastor. Reviewed ID notes from 03/2017   Family/ staff Communication: reviewed care plan with patient and charge nurse. Goal of care is to return to independent living facility post completion of therapy.    Labs/tests ordered: cbc, bmp 06/19/17  Blanchie Serve, MD Internal Medicine [facility] [address] [place], [place] [address] Cell Phone (Monday-Friday 8 am - 5 pm): [phone] On Call: [phone] and follow prompts after 5 pm and on weekends Office Phone: [phone] Office Fax: [phone]

## 2017-06-14 LAB — AEROBIC/ANAEROBIC CULTURE (SURGICAL/DEEP WOUND): CULTURE: NO GROWTH

## 2017-06-14 LAB — AEROBIC/ANAEROBIC CULTURE W GRAM STAIN (SURGICAL/DEEP WOUND)

## 2017-06-15 ENCOUNTER — Other Ambulatory Visit (INDEPENDENT_AMBULATORY_CARE_PROVIDER_SITE_OTHER): Payer: Self-pay | Admitting: Specialist

## 2017-06-15 NOTE — Telephone Encounter (Signed)
wrong doc. Delsa Sale

## 2017-06-16 NOTE — Telephone Encounter (Signed)
Xu Pt

## 2017-06-19 ENCOUNTER — Ambulatory Visit (INDEPENDENT_AMBULATORY_CARE_PROVIDER_SITE_OTHER): Payer: Medicare Other | Admitting: Orthopaedic Surgery

## 2017-06-20 ENCOUNTER — Other Ambulatory Visit: Payer: Self-pay | Admitting: *Deleted

## 2017-06-20 LAB — BASIC METABOLIC PANEL
BUN: 12 (ref 4–21)
CREATININE: 0.8 (ref <=1.1)
POTASSIUM: 4 (ref 3.4–5.3)
SODIUM: 139 (ref 137–147)

## 2017-06-20 LAB — CBC AND DIFFERENTIAL
HCT: 28 — AB (ref 36–46)
HEMOGLOBIN: 9.5 — AB (ref 12.0–16.0)
Platelets: 343 (ref 150–399)
WBC: 6.9

## 2017-06-23 ENCOUNTER — Ambulatory Visit (INDEPENDENT_AMBULATORY_CARE_PROVIDER_SITE_OTHER): Payer: Medicare Other | Admitting: Orthopaedic Surgery

## 2017-06-23 ENCOUNTER — Ambulatory Visit (INDEPENDENT_AMBULATORY_CARE_PROVIDER_SITE_OTHER): Payer: No Typology Code available for payment source

## 2017-06-23 ENCOUNTER — Encounter (INDEPENDENT_AMBULATORY_CARE_PROVIDER_SITE_OTHER): Payer: Self-pay | Admitting: Orthopaedic Surgery

## 2017-06-23 DIAGNOSIS — S72092D Other fracture of head and neck of left femur, subsequent encounter for closed fracture with routine healing: Secondary | ICD-10-CM

## 2017-06-23 NOTE — Progress Notes (Signed)
Leah Holland is 2 weeks status post removal of intramedullary nail and conversion to cemented hip replacement. She is overall doing well. She is taking Tylenol for pain. She is progressing very well at her skilled nursing facility. Her incision has healed without signs of infection. The staples were removed. X-ray show stable alignment of the hip replacement. Her leg lengths are equal. Denies any significant pain. Patient is doing very well. I'll see her back in 4 weeks for recheck. Standing low AP pelvis and lateral left hip on return. Questions encouraged and answered.

## 2017-06-26 ENCOUNTER — Encounter: Payer: Self-pay | Admitting: Nurse Practitioner

## 2017-06-26 ENCOUNTER — Non-Acute Institutional Stay (SKILLED_NURSING_FACILITY): Payer: Medicare Other | Admitting: Nurse Practitioner

## 2017-06-26 DIAGNOSIS — D62 Acute posthemorrhagic anemia: Secondary | ICD-10-CM

## 2017-06-26 DIAGNOSIS — M810 Age-related osteoporosis without current pathological fracture: Secondary | ICD-10-CM | POA: Diagnosis not present

## 2017-06-26 DIAGNOSIS — I10 Essential (primary) hypertension: Secondary | ICD-10-CM

## 2017-06-26 DIAGNOSIS — E039 Hypothyroidism, unspecified: Secondary | ICD-10-CM

## 2017-06-26 DIAGNOSIS — G4700 Insomnia, unspecified: Secondary | ICD-10-CM | POA: Diagnosis not present

## 2017-06-26 DIAGNOSIS — F310 Bipolar disorder, current episode hypomanic: Secondary | ICD-10-CM | POA: Diagnosis not present

## 2017-06-26 DIAGNOSIS — Z792 Long term (current) use of antibiotics: Secondary | ICD-10-CM | POA: Diagnosis not present

## 2017-06-26 DIAGNOSIS — I480 Paroxysmal atrial fibrillation: Secondary | ICD-10-CM | POA: Diagnosis not present

## 2017-06-26 DIAGNOSIS — S72142K Displaced intertrochanteric fracture of left femur, subsequent encounter for closed fracture with nonunion: Secondary | ICD-10-CM

## 2017-06-26 DIAGNOSIS — E119 Type 2 diabetes mellitus without complications: Secondary | ICD-10-CM

## 2017-06-26 DIAGNOSIS — Z7901 Long term (current) use of anticoagulants: Secondary | ICD-10-CM

## 2017-06-26 DIAGNOSIS — E871 Hypo-osmolality and hyponatremia: Secondary | ICD-10-CM | POA: Diagnosis not present

## 2017-06-26 NOTE — Assessment & Plan Note (Signed)
Normalized sodium 139 06/20/17

## 2017-06-26 NOTE — Assessment & Plan Note (Signed)
Heart rate is in controlled, continue Diltiazem 180mg , Metoprolol 50mg  bid, Elliquis 2.5mg  bid.

## 2017-06-26 NOTE — Assessment & Plan Note (Signed)
Last Hgb 9.5 06/20/17

## 2017-06-26 NOTE — Assessment & Plan Note (Signed)
Continue Synthroid 136mcg po daily, update TSH

## 2017-06-26 NOTE — Assessment & Plan Note (Signed)
Controlled, continue combination of Diltiazem 180mg , Metoprolol 50mg  bid, Furosemide 20mg  prn

## 2017-06-26 NOTE — Assessment & Plan Note (Signed)
Continue Temazepam 30mg  prn at night.

## 2017-06-26 NOTE — Assessment & Plan Note (Addendum)
The patient was admitted to SNF [facility] following her hospital stay 06/09/17 to 06/12/17 for closed left intertrochanteric fracture in setting of left hip replacement. She underwent removal of the left intramedullary nail and concert to left cemented hemi hip arthroplasty 06/09/17. On Bactrim for prosthetic joint infection suppression therapy. Hx of left hip surgical repair 01/25/17. Prn Robaxin and Norco available to her for pain.  She was received rehab at [facility] since 06/12/17, she is able to ambulate with [facility], followed up with Ortho 06/23/17, she is cleared for discharge from Orthopedic standpoint.

## 2017-06-26 NOTE — Assessment & Plan Note (Signed)
For the left prosthetic joint infection suppression

## 2017-06-26 NOTE — Assessment & Plan Note (Addendum)
Continue Ca, Vit D, Prolia, will obtain CBC with diff, CMP, Vit VD level prior to Prolia per Ortho.

## 2017-06-26 NOTE — Progress Notes (Signed)
Location:  [facility] Room Number: 27 Place of Service:  SNF (31) Provider:  Mast, Manxie  NP  Blanchie Serve, MD  Patient Care Team: Blanchie Serve, MD as PCP - General (Internal Medicine) Earlie Server, MD as Consulting Physician (Orthopedic Surgery) Lorretta Harp, MD as Consulting Physician (Cardiology) Calvert Cantor, MD as Consulting Physician (Ophthalmology) [name], MD as Attending Physician [facility])  Extended Emergency Contact Information Primary Emergency Contact: Ponciano Ort Address: [address]          [place], [place] [address] Johnnette Litter of [place] Phone: [phone] Relation: Son Secondary Emergency Contact: [place], [place] [address] Johnnette Litter of [place] Phone: [phone] Relation: Son  Code Status:  Full Code Goals of care: Advanced Directive information Advanced Directives 06/26/2017  Does Patient Have a Medical Advance Directive? Yes  Type of Advance Directive Living will;Healthcare Power of Attorney  Does patient want to make changes to medical advance directive? No - Patient declined  Copy of [place] in Chart? Yes  Would patient like information on creating a medical advance directive? -  Pre-existing out of facility DNR order (yellow form or pink MOST form) -     Chief Complaint  Patient presents with  . Discharge Note    Patient to return to her apartment after hip replacement.    HPI:  Pt is a 83 y.o. female seen today for an acute visit for discharge planing. The patient was admitted to SNF [facility] following her hospital stay 06/09/17 to 06/12/17 for closed left intertrochanteric fracture in setting of left hip replacement. She underwent removal of the left intramedullary nail and concert to left cemented hemi hip arthroplasty 06/09/17. On Bactrim for prosthetic joint infection suppression therapy. Hx of left hip surgical repair 01/25/17.  Prn Robaxin and Norco available to her for pain.   She was received rehab at [facility] since 06/12/17, she is able to ambulate with SBA, followed up with Ortho 06/23/17, she is cleared for discharge from Orthopedic standpoint.  Hx of Depression, stable on Cymbalta 30mg , Temazepam 30mg  prn at night.  Hypothyroidism, taking Levothyroxine 170mcg since 02/2017. Afib, heart rate is in control, taking Diltiazem 180mg , Eliquis. HTN/edema taking Furosemide 20mg  prn, Metoprolol 50mg  bid, taking ASA and Atorvastatin daily. Osteoporosis, taking Prolia and Calcium. Osteoporosis, taking Prolia, Vit D, Ca. GERD stable on Nexium.    Past Medical History:  Diagnosis Date  . Anxiety attack    "take RX prn" (08/13/2013)  . Arthritis    "probably" (08/13/2013)  . Atrial fibrillation ([place])    new onset/pt report 08/13/2013  . Bronchitis 12/2011  . Complication of anesthesia   . Depression   . Diabetes mellitus type 2, diet-controlled ([place])    denies  . Femur fracture (HCC)    left proximal femur non-union  . GERD (gastroesophageal reflux disease)   . H/O hiatal hernia   . Hardware complicating wound infection ([place]) 08/22/2016  . History of blood transfusion    "w/both knee OR's" (08/13/2013)  . Hyperlipidemia   . Hyperlipidemia   . Hypertension   . Hypothyroidism    takes synthroid  . Insomnia   . Intertrochanteric fracture of right hip ([place]) 07/30/2015  . Neuromuscular disorder ([place])    carpal tunnel in right hand  . Periprosthetic fracture around internal prosthetic right hip joint ([place]), nonunion basicervical femoral neck with broken hardware and subtrochanteric fracture 03/16/2016  .  PONV (postoperative nausea and vomiting)   . 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 Proteus infection 08/22/2016  . Serratia infection 11/14/2016  . Skin cancer of face 2000's   "one on each side of my face and one on my nose" (08/13/2013)  . Swelling of extremity    sees Dr. Gwenlyn Found, cardiologist 1 x year [phone]  . Tendon  dysfunction 08/22/2016  . Wears hearing aid    [facility]   Past Surgical History:  Procedure Laterality Date  . 2D ECHOCARDIOGRAM  12/07/2004   EF 48%, moderate pulmonary hypertension  . BREAST BIOPSY Left ?1970's  . CARDIOVASCULAR STRESS TEST  12/07/11  . CARDIOVASCULAR STRESS TEST  12/07/2011   LEXISCAN, normal scan, no significant wall abnormalities noted  . CARPAL TUNNEL RELEASE Right ~ 2005  . CATARACT EXTRACTION, BILATERAL Bilateral 2000  . DILATION AND CURETTAGE OF UTERUS  1950's  . DOPPLER ECHOCARDIOGRAPHY  12/07/11  . EYE SURGERY    . FEMUR IM NAIL Right 08/01/2015   Procedure: INTRAMEDULLARY (IM) NAIL FEMORAL;  Surgeon: Marchia Bond, MD;  Location: [place];  Service: Orthopedics;  Laterality: Right;  . HARDWARE REMOVAL Right 03/16/2016   Procedure: HARDWARE REMOVAL;  Surgeon: Marchia Bond, MD;  Location: [place];  Service: Orthopedics;  Laterality: Right;  . INTRAMEDULLARY (IM) NAIL INTERTROCHANTERIC Left 01/26/2017   Procedure: INTRAMEDULLARY (IM) NAIL INTERTROCHANTRIC;  Surgeon: Leandrew Koyanagi, MD;  Location: [place];  Service: Orthopedics;  Laterality: Left;  . JOINT REPLACEMENT     bilateral knee replacement  . SKIN CANCER EXCISION  2000's   "off my nose" (08/13/2013)  . TOTAL HIP ARTHROPLASTY Right 03/16/2016   Procedure: TOTAL HIP ARTHROPLASTY;  Surgeon: Marchia Bond, MD;  Location: [place];  Service: Orthopedics;  Laterality: Right;  . TOTAL HIP ARTHROPLASTY Left 06/09/2017   Procedure: REMOVE LEFT HIP INTRAMEDULLARY NAIL AND CONVERT TO LEFT CEMENTED HEMI HIP ARTHROPLASTY;  Surgeon: Leandrew Koyanagi, MD;  Location: [place];  Service: Orthopedics;  Laterality: Left;  . TOTAL KNEE ARTHROPLASTY Right 2005  . TOTAL KNEE ARTHROPLASTY  03/02/2012   Procedure: TOTAL KNEE ARTHROPLASTY;  Surgeon: Yvette Rack., MD;  Location: [place];  Service: Orthopedics;  Laterality: Left;    Allergies  Allergen Reactions  . Levaquin [Levofloxacin] Other (See Comments)    tendinopathy  . Morphine And Related  Other (See Comments)    Made pt "wild"    Outpatient Encounter Prescriptions as of 06/26/2017  Medication Sig  . acetaminophen (TYLENOL) 325 MG tablet Take 650 mg by mouth every 6 (six) hours as needed for moderate pain.   Marland Kitchen aspirin EC 81 MG tablet Take 81 mg by mouth daily.  Marland Kitchen atorvastatin (LIPITOR) 80 MG tablet Take 80 mg by mouth daily.  . calcium carbonate (OS-CAL - DOSED IN MG OF ELEMENTAL CALCIUM) 1250 (500 Ca) MG tablet Take 1 tablet by mouth every evening.  . Cholecalciferol 2000 units CAPS Take 2,000 Units by mouth every evening.  . denosumab (PROLIA) 60 MG/ML SOLN injection Inject 60 mg into the skin every 6 (six) months. Administer in upper arm, thigh, or abdomen  . diltiazem (CARDIZEM CD) 180 MG 24 hr capsule Take 1 capsule (180 mg total) by mouth daily.  . DULoxetine (CYMBALTA) 30 MG capsule Take 30 mg by mouth daily.  Marland Kitchen ELIQUIS 2.5 MG TABS tablet TAKE 1 TABLET TWICE DAILY.  Marland Kitchen esomeprazole (NEXIUM) 20 MG capsule Take 20 mg by mouth daily at 12 noon.  . feeding supplement (BOOST HIGH PROTEIN)  LIQD Take 237 mLs by mouth 3 (three) times daily between meals. Prefers chocolate diabetic and peanut butter crackers before bedtime.  . Flaxseed, Linseed, (FLAXSEED OIL) 1000 MG CAPS Take 1,000 mg by mouth 2 (two) times daily.   . furosemide (LASIX) 20 MG tablet Take 1 tablet (20 mg total) by mouth as needed for edema.  Marland Kitchen HYDROcodone-acetaminophen (NORCO) 7.5-325 MG tablet Take 1-2 tablets by mouth every 6 (six) hours as needed for moderate pain.  Marland Kitchen levothyroxine (SYNTHROID, LEVOTHROID) 150 MCG tablet Take 150 mcg by mouth daily before breakfast.  . methocarbamol (ROBAXIN) 500 MG tablet Take 1 tablet (500 mg total) by mouth every 8 (eight) hours as needed for muscle spasms.  . metoprolol (LOPRESSOR) 50 MG tablet Take 50 mg by mouth 2 (two) times daily.  . Multiple Vitamins-Minerals (MULTIVITAMIN) tablet Take 1 tablet by mouth 2 (two) times daily.   . ondansetron (ZOFRAN) 4 MG tablet Take 4 mg  by mouth every 8 (eight) hours as needed for nausea or vomiting.  . polyethylene glycol (MIRALAX / GLYCOLAX) packet Take 17 g by mouth daily.  . potassium chloride (K-DUR) 10 MEQ tablet Take 10 mEq by mouth. Take one tablet as needed when giving lasix as needed  . saccharomyces boulardii (FLORASTOR) 250 MG capsule Take 250 mg by mouth 2 (two) times daily.  Marland Kitchen senna-docusate (SENOKOT S) 8.6-50 MG tablet Take 1 tablet by mouth at bedtime as needed.  . sulfamethoxazole-trimethoprim (BACTRIM) 400-80 MG tablet Take 1 tablet by mouth 2 (two) times daily.  . temazepam (RESTORIL) 30 MG capsule Take 1 capsule (30 mg total) by mouth at bedtime as needed for sleep.  . [DISCONTINUED] ferrous sulfate 325 (65 FE) MG tablet Take 1 tablet (325 mg total) by mouth 3 (three) times daily with meals.   No facility-administered encounter medications on file as of 06/26/2017.     Review of Systems  Constitutional: Negative for activity change, appetite change, chills and fever.  HENT: Positive for hearing loss. Negative for congestion, mouth sores, rhinorrhea, sore throat and trouble swallowing.   Eyes:       Wears glasses   Respiratory: Negative for cough, shortness of breath and wheezing.   Cardiovascular: Negative for chest pain, palpitations and leg swelling.  Gastrointestinal: Negative for abdominal pain, blood in stool, constipation, diarrhea, nausea and vomiting.       Last bowel movement was this morning. Has GERD but is managed with medication  Genitourinary: Negative for dysuria and frequency.  Musculoskeletal: Positive for gait problem. Negative for back pain.       Has history of fracture and replacement of right hip and left hip. Pain medication is helping with her pain.   Skin: Negative for pallor and rash.  Neurological: Positive for dizziness. Negative for tremors, seizures, syncope, light-headedness and headaches.       Occasional dizziness present and slow position change helps  Hematological:  Does not bruise/bleed easily.  Psychiatric/Behavioral: Negative for agitation, behavioral problems and confusion.    Immunization History  Administered Date(s) Administered  . Influenza Whole 03/05/2014  . Influenza,inj,Quad PF,36+ Mos 08/22/2016   Pertinent  Health Maintenance Due  Topic Date Due  . FOOT EXAM  09/10/1932  . OPHTHALMOLOGY EXAM  09/10/1932  . URINE MICROALBUMIN  09/10/1934  . DEXA SCAN  09/10/1989  . PNA vac Low Risk Adult (1 of 2 - PCV13) 09/10/1989  . HEMOGLOBIN A1C  02/09/2016  . INFLUENZA VACCINE  07/12/2017   Fall Risk  03/27/2017 11/14/2016 08/22/2016  08/06/2015  Falls in the past year? No No No Yes  Number falls in past yr: - - - 1  Injury with Fall? - - - Yes  Risk for fall due to : - Impaired mobility Impaired mobility -  Follow up - - - Falls evaluation completed   Functional Status Survey:    Vitals:   06/26/17 1009  BP: (!) 138/50  Pulse: 64  Resp: 20  Temp: 98.3 F (36.8 C)  Weight: 127 lb 4.8 oz (57.7 kg)  Height: 5\' 4"  (1.626 m)   Body mass index is 21.85 kg/m. Physical Exam  Constitutional: She is oriented to person, place, and time. She appears well-developed and well-nourished. No distress.  HENT:  Head: Normocephalic and atraumatic.  Mouth/Throat: Oropharynx is clear and moist.  Hearing aid present, has dentures  Eyes: Pupils are equal, round, and reactive to light. Conjunctivae and EOM are normal.  Neck: Normal range of motion. Neck supple. No thyromegaly present.  Cardiovascular: Normal rate, regular rhythm and normal heart sounds.   Pulmonary/Chest: Effort normal and breath sounds normal.  Abdominal: Soft. Bowel sounds are normal. There is no tenderness. There is no guarding.  Musculoskeletal:  Can move all 4 extremities, ROM with left hip limited, able to bear weight to both LE, contact guard assist with transfers, using a walker with gait belt with therapy, trace leg edema. Intact dorsalis pedis. Soft compartment. Plantar  flexion and dorsiflexion intact to both.   Lymphadenopathy:    She has no cervical adenopathy.  Neurological: She is alert and oriented to person, place, and time.  Skin: Skin is warm and dry. No rash noted. She is not diaphoretic.  Surgical incision to left thigh with mepilex dressing clean and dry. Dressing to left lateral knee clean and dry  Psychiatric: She has a normal mood and affect. Her behavior is normal.    Labs reviewed:  Recent Labs  01/27/17 0724 01/28/17 0508  06/10/17 0350 06/11/17 0339 06/12/17 0500 06/20/17  NA 134* 133*  < > 134* 133* 138 139  K 3.7 3.9  < > 4.3 4.8 4.3 4.0  CL 98* 98*  < > 102 101 104  --   CO2 26 28  < > 27 28 29   --   GLUCOSE 122* 122*  < > 168* 150* 104*  --   BUN 20 26*  < > 10 26* 20 12  CREATININE 1.11* 1.41*  < > 0.74 1.04* 0.70 0.8  CALCIUM 8.6* 8.9  < > 8.4* 8.5* 9.3  --   MG 1.3* 1.7  --   --   --   --   --   < > = values in this interval not displayed.  Recent Labs  09/12/16 1510 02/28/17  AST 23 12*  ALT 29 9  ALKPHOS 83 109  BILITOT 0.7  --   PROT 5.9*  --   ALBUMIN 4.0  --     Recent Labs  01/25/17 2130  03/27/17 1128 06/07/17 1035  06/10/17 0350 06/11/17 0339 06/12/17 0500 06/20/17  WBC 15.3*  < > 6.8 4.6  --  9.2 14.9* 11.1* 6.9  NEUTROABS 12.9*  --  3,740 2,668  --   --   --   --   --   HGB 12.3  < > 13.8 12.9  < > 9.5* 8.2* 8.9* 9.5*  HCT 36.7  < > 42.7 39.4  < > 29.4* 25.6* 27.4* 28*  MCV 92.7  < > 97.7 91.8  --  93.0 93.1 93.2  --   PLT 229  < > 220 263  --  210 211 199 343  < > = values in this interval not displayed. Lab Results  Component Value Date   TSH 2.304 03/16/2016   Lab Results  Component Value Date   HGBA1C 5.9 08/10/2015   Lab Results  Component Value Date   CHOL 116 04/26/2013   HDL 45 04/26/2013   LDLCALC 60 04/26/2013   TRIG 56 04/26/2013   CHOLHDL 2.6 04/26/2013    Significant Diagnostic Results in last 30 days:  Dg Pelvis Portable  Result Date: 06/09/2017 CLINICAL DATA:   Status post left hip arthroplasty. EXAM: PORTABLE PELVIS 1-2 VIEWS COMPARISON:  06/06/2017 CT FINDINGS: Left hip arthroplasty now noted. No dislocation or complicating features. Right hip arthroplasty changes again identified. IMPRESSION: Left hip arthroplasty changes without complicating features. Electronically Signed   By: Margarette Canada M.D.   On: 06/09/2017 18:49   Ct Hip Left Wo Contrast  Result Date: 05/29/2017 CLINICAL DATA:  Left hip fracture January 25, 2017. EXAM: CT OF THE LEFT HIP WITHOUT CONTRAST TECHNIQUE: Multidetector CT imaging of the left hip was performed according to the standard protocol. Multiplanar CT image reconstructions were also generated. COMPARISON:  X-ray pelvis 02/24/2017, 04/04/2017 FINDINGS: Bones/Joint/Cartilage Ununited left intertrochanteric fracture transfixed with a intramedullary nail and 2 femoral neck screws without failure complication. No significant callus formation. Mild heterotopic ossification adjacent to the greater trochanter. No other fracture or dislocation. Mild osteoarthritis of the left hip. Normal alignment. No joint effusion. Ligaments Ligaments are suboptimally evaluated by CT. Muscles and Tendons Muscles are normal. No significant muscle atrophy. No intramuscular fluid collection or hematoma. Soft tissue No fluid collection or hematoma. No soft tissue mass. Peripheral vascular atherosclerotic disease. IMPRESSION: 1. Ununited left intertrochanteric fracture transfixed with a intramedullary nail and 2 femoral neck screws without failure complication. No significant callus formation. Electronically Signed   By: Kathreen Devoid   On: 05/29/2017 14:56   Ct Femur Left Wo Contrast  Result Date: 06/06/2017 CLINICAL DATA:  Fracture of distal interlocking screw. EXAM: CT OF THE LOWER LEFT EXTREMITY WITHOUT CONTRAST TECHNIQUE: Multidetector CT imaging of the lower left extremity was performed according to the standard protocol. COMPARISON:  Radiographs dated  05/29/2017 and 01/26/2017 FINDINGS: Bones/Joint/Cartilage There is a fracture of the proximal portion of the intramedullary nail at the site of the 2 screws in the femoral head and neck. There is also a fracture of the distal locking screw. There is increased angulation at the intertrochanteric fracture as compared to the immediate postoperative image. There is no loosening of the screws or rod. IMPRESSION: 1. Fracture of the proximal portion of the intramedullary nail at the site of the 2 compression screws. 2. Fracture of the distal interlocking screw. 3. Increased angulation of the intertrochanteric fracture. Electronically Signed   By: Lorriane Shire M.D.   On: 06/06/2017 11:23   Dg C-arm 1-60 Min-no Report  Result Date: 06/09/2017 Fluoroscopy was utilized by the requesting physician.  No radiographic interpretation.   Xr Femur Min 2 Views Left  Result Date: 05/29/2017 Interval breakage of distal interlocking screw. No evidence of lag screw cut out. Proximal portion of the nail appears to be slightly bent into varus. No evidence of nonunion.  Xr Hip Unilat W Or W/o Pelvis 2-3 Views Left  Result Date: 06/23/2017 Stable left hip hemiarthroplasty   Assessment/Plan Hypertension Controlled, continue combination of Diltiazem 180mg , Metoprolol 50mg  bid, Furosemide 20mg  prn  Paroxysmal  atrial fibrillation (HCC) Heart rate is in controlled, continue Diltiazem 180mg , Metoprolol 50mg  bid, Elliquis 2.5mg  bid.   Hypothyroidism Continue Synthroid 132mcg po daily, update TSH   Diabetes mellitus type 2, diet-controlled (HCC) Diet controlled. Update Hgb a1c.   OP (osteoporosis) Continue Ca, Vit D, Prolia, will obtain CBC with diff, CMP, Vit VD level prior to Prolia per Ortho.   Closed intertrochanteric fracture with nonunion, left The patient was admitted to SNF [facility] following her hospital stay 06/09/17 to 06/12/17 for closed left intertrochanteric fracture in setting of left hip replacement. She  underwent removal of the left intramedullary nail and concert to left cemented hemi hip arthroplasty 06/09/17. On Bactrim for prosthetic joint infection suppression therapy. Hx of left hip surgical repair 01/25/17. Prn Robaxin and Norco available to her for pain.  She was received rehab at [facility] since 06/12/17, she is able to ambulate with SBA, followed up with Ortho 06/23/17, she is cleared for discharge from Orthopedic standpoint.   Insomnia Continue Temazepam 30mg  prn at night.   Postoperative anemia due to acute blood loss Last Hgb 9.5 06/20/17  Bipolar disorder (HCC) Stable, continue Cymbalta 30mg  qd  Hyponatremia Normalized sodium 139 06/20/17  Chronic anticoagulation For Afib, continue Eliquis.   Chronic antibiotic suppression For the left prosthetic joint infection suppression     Family/ staff Communication: IL when able.   Labs/tests ordered:  TSH Hgb a1c CBC with diff CMP Vit D level.

## 2017-06-26 NOTE — Assessment & Plan Note (Signed)
For Afib, continue Eliquis.  

## 2017-06-26 NOTE — Assessment & Plan Note (Signed)
Diet controlled. Update Hgb a1c.

## 2017-06-26 NOTE — Assessment & Plan Note (Signed)
Stable, continue Cymbalta 30mg qd.  

## 2017-06-29 DIAGNOSIS — Z4789 Encounter for other orthopedic aftercare: Secondary | ICD-10-CM | POA: Diagnosis not present

## 2017-06-29 DIAGNOSIS — R41841 Cognitive communication deficit: Secondary | ICD-10-CM | POA: Diagnosis not present

## 2017-06-29 DIAGNOSIS — Z471 Aftercare following joint replacement surgery: Secondary | ICD-10-CM | POA: Diagnosis not present

## 2017-06-29 DIAGNOSIS — M6281 Muscle weakness (generalized): Secondary | ICD-10-CM | POA: Diagnosis not present

## 2017-06-29 DIAGNOSIS — M21751 Unequal limb length (acquired), right femur: Secondary | ICD-10-CM | POA: Diagnosis not present

## 2017-06-29 DIAGNOSIS — R2681 Unsteadiness on feet: Secondary | ICD-10-CM | POA: Diagnosis not present

## 2017-06-29 DIAGNOSIS — M722 Plantar fascial fibromatosis: Secondary | ICD-10-CM | POA: Diagnosis not present

## 2017-06-29 DIAGNOSIS — R278 Other lack of coordination: Secondary | ICD-10-CM | POA: Diagnosis not present

## 2017-07-07 DIAGNOSIS — M810 Age-related osteoporosis without current pathological fracture: Secondary | ICD-10-CM | POA: Diagnosis not present

## 2017-07-07 DIAGNOSIS — E559 Vitamin D deficiency, unspecified: Secondary | ICD-10-CM | POA: Diagnosis not present

## 2017-07-10 DIAGNOSIS — R2681 Unsteadiness on feet: Secondary | ICD-10-CM | POA: Diagnosis not present

## 2017-07-10 DIAGNOSIS — Z471 Aftercare following joint replacement surgery: Secondary | ICD-10-CM | POA: Diagnosis not present

## 2017-07-10 DIAGNOSIS — M722 Plantar fascial fibromatosis: Secondary | ICD-10-CM | POA: Diagnosis not present

## 2017-07-10 DIAGNOSIS — R278 Other lack of coordination: Secondary | ICD-10-CM | POA: Diagnosis not present

## 2017-07-10 DIAGNOSIS — M6281 Muscle weakness (generalized): Secondary | ICD-10-CM | POA: Diagnosis not present

## 2017-07-10 DIAGNOSIS — M21751 Unequal limb length (acquired), right femur: Secondary | ICD-10-CM | POA: Diagnosis not present

## 2017-07-11 DIAGNOSIS — R2681 Unsteadiness on feet: Secondary | ICD-10-CM | POA: Diagnosis not present

## 2017-07-11 DIAGNOSIS — Z471 Aftercare following joint replacement surgery: Secondary | ICD-10-CM | POA: Diagnosis not present

## 2017-07-11 DIAGNOSIS — R278 Other lack of coordination: Secondary | ICD-10-CM | POA: Diagnosis not present

## 2017-07-11 DIAGNOSIS — M722 Plantar fascial fibromatosis: Secondary | ICD-10-CM | POA: Diagnosis not present

## 2017-07-11 DIAGNOSIS — M6281 Muscle weakness (generalized): Secondary | ICD-10-CM | POA: Diagnosis not present

## 2017-07-11 DIAGNOSIS — M21751 Unequal limb length (acquired), right femur: Secondary | ICD-10-CM | POA: Diagnosis not present

## 2017-07-12 DIAGNOSIS — R2681 Unsteadiness on feet: Secondary | ICD-10-CM | POA: Diagnosis not present

## 2017-07-12 DIAGNOSIS — R41841 Cognitive communication deficit: Secondary | ICD-10-CM | POA: Diagnosis not present

## 2017-07-12 DIAGNOSIS — Z471 Aftercare following joint replacement surgery: Secondary | ICD-10-CM | POA: Diagnosis not present

## 2017-07-12 DIAGNOSIS — M722 Plantar fascial fibromatosis: Secondary | ICD-10-CM | POA: Diagnosis not present

## 2017-07-12 DIAGNOSIS — R278 Other lack of coordination: Secondary | ICD-10-CM | POA: Diagnosis not present

## 2017-07-12 DIAGNOSIS — M21751 Unequal limb length (acquired), right femur: Secondary | ICD-10-CM | POA: Diagnosis not present

## 2017-07-12 DIAGNOSIS — M6281 Muscle weakness (generalized): Secondary | ICD-10-CM | POA: Diagnosis not present

## 2017-07-14 DIAGNOSIS — M722 Plantar fascial fibromatosis: Secondary | ICD-10-CM | POA: Diagnosis not present

## 2017-07-14 DIAGNOSIS — R41841 Cognitive communication deficit: Secondary | ICD-10-CM | POA: Diagnosis not present

## 2017-07-14 DIAGNOSIS — R278 Other lack of coordination: Secondary | ICD-10-CM | POA: Diagnosis not present

## 2017-07-14 DIAGNOSIS — M6281 Muscle weakness (generalized): Secondary | ICD-10-CM | POA: Diagnosis not present

## 2017-07-14 DIAGNOSIS — R2681 Unsteadiness on feet: Secondary | ICD-10-CM | POA: Diagnosis not present

## 2017-07-14 DIAGNOSIS — Z471 Aftercare following joint replacement surgery: Secondary | ICD-10-CM | POA: Diagnosis not present

## 2017-07-17 ENCOUNTER — Other Ambulatory Visit (INDEPENDENT_AMBULATORY_CARE_PROVIDER_SITE_OTHER): Payer: Self-pay | Admitting: Orthopaedic Surgery

## 2017-07-17 DIAGNOSIS — R41841 Cognitive communication deficit: Secondary | ICD-10-CM | POA: Diagnosis not present

## 2017-07-17 DIAGNOSIS — R278 Other lack of coordination: Secondary | ICD-10-CM | POA: Diagnosis not present

## 2017-07-17 DIAGNOSIS — R2681 Unsteadiness on feet: Secondary | ICD-10-CM | POA: Diagnosis not present

## 2017-07-17 DIAGNOSIS — M722 Plantar fascial fibromatosis: Secondary | ICD-10-CM | POA: Diagnosis not present

## 2017-07-17 DIAGNOSIS — Z471 Aftercare following joint replacement surgery: Secondary | ICD-10-CM | POA: Diagnosis not present

## 2017-07-17 DIAGNOSIS — M6281 Muscle weakness (generalized): Secondary | ICD-10-CM | POA: Diagnosis not present

## 2017-07-19 DIAGNOSIS — M6281 Muscle weakness (generalized): Secondary | ICD-10-CM | POA: Diagnosis not present

## 2017-07-19 DIAGNOSIS — M722 Plantar fascial fibromatosis: Secondary | ICD-10-CM | POA: Diagnosis not present

## 2017-07-19 DIAGNOSIS — R41841 Cognitive communication deficit: Secondary | ICD-10-CM | POA: Diagnosis not present

## 2017-07-19 DIAGNOSIS — Z471 Aftercare following joint replacement surgery: Secondary | ICD-10-CM | POA: Diagnosis not present

## 2017-07-19 DIAGNOSIS — R278 Other lack of coordination: Secondary | ICD-10-CM | POA: Diagnosis not present

## 2017-07-19 DIAGNOSIS — R2681 Unsteadiness on feet: Secondary | ICD-10-CM | POA: Diagnosis not present

## 2017-07-20 DIAGNOSIS — M722 Plantar fascial fibromatosis: Secondary | ICD-10-CM | POA: Diagnosis not present

## 2017-07-20 DIAGNOSIS — Z471 Aftercare following joint replacement surgery: Secondary | ICD-10-CM | POA: Diagnosis not present

## 2017-07-20 DIAGNOSIS — R278 Other lack of coordination: Secondary | ICD-10-CM | POA: Diagnosis not present

## 2017-07-20 DIAGNOSIS — M6281 Muscle weakness (generalized): Secondary | ICD-10-CM | POA: Diagnosis not present

## 2017-07-20 DIAGNOSIS — R41841 Cognitive communication deficit: Secondary | ICD-10-CM | POA: Diagnosis not present

## 2017-07-20 DIAGNOSIS — R2681 Unsteadiness on feet: Secondary | ICD-10-CM | POA: Diagnosis not present

## 2017-07-21 ENCOUNTER — Encounter (INDEPENDENT_AMBULATORY_CARE_PROVIDER_SITE_OTHER): Payer: Self-pay | Admitting: Orthopaedic Surgery

## 2017-07-21 ENCOUNTER — Ambulatory Visit (INDEPENDENT_AMBULATORY_CARE_PROVIDER_SITE_OTHER): Payer: Medicare Other | Admitting: Orthopaedic Surgery

## 2017-07-21 ENCOUNTER — Ambulatory Visit (INDEPENDENT_AMBULATORY_CARE_PROVIDER_SITE_OTHER): Payer: Medicare Other

## 2017-07-21 DIAGNOSIS — S72142K Displaced intertrochanteric fracture of left femur, subsequent encounter for closed fracture with nonunion: Secondary | ICD-10-CM | POA: Diagnosis not present

## 2017-07-21 NOTE — Progress Notes (Signed)
Patient is 6 weeks status post conversion of a broken cephalo-medullary implant to a cemented hip arthroplasty. She is doing well. She walks in today with a walker. She has very little pain. She has met all of her physical therapy goals. She denies any complications. Her x-ray show stable arthroplasty. At this point she may drive. Continue 6 more weeks of posterior hip precautions. She may just do home exercises at this point. Questions encouraged and answered. Follow-up in 6 weeks. If she is doing well no x-rays needed.

## 2017-07-25 DIAGNOSIS — R2681 Unsteadiness on feet: Secondary | ICD-10-CM | POA: Diagnosis not present

## 2017-07-25 DIAGNOSIS — Z471 Aftercare following joint replacement surgery: Secondary | ICD-10-CM | POA: Diagnosis not present

## 2017-07-25 DIAGNOSIS — R41841 Cognitive communication deficit: Secondary | ICD-10-CM | POA: Diagnosis not present

## 2017-07-25 DIAGNOSIS — M722 Plantar fascial fibromatosis: Secondary | ICD-10-CM | POA: Diagnosis not present

## 2017-07-25 DIAGNOSIS — R278 Other lack of coordination: Secondary | ICD-10-CM | POA: Diagnosis not present

## 2017-07-25 DIAGNOSIS — M6281 Muscle weakness (generalized): Secondary | ICD-10-CM | POA: Diagnosis not present

## 2017-07-26 DIAGNOSIS — M722 Plantar fascial fibromatosis: Secondary | ICD-10-CM | POA: Diagnosis not present

## 2017-07-26 DIAGNOSIS — R2681 Unsteadiness on feet: Secondary | ICD-10-CM | POA: Diagnosis not present

## 2017-07-26 DIAGNOSIS — Z471 Aftercare following joint replacement surgery: Secondary | ICD-10-CM | POA: Diagnosis not present

## 2017-07-26 DIAGNOSIS — M6281 Muscle weakness (generalized): Secondary | ICD-10-CM | POA: Diagnosis not present

## 2017-07-26 DIAGNOSIS — R278 Other lack of coordination: Secondary | ICD-10-CM | POA: Diagnosis not present

## 2017-07-26 DIAGNOSIS — R41841 Cognitive communication deficit: Secondary | ICD-10-CM | POA: Diagnosis not present

## 2017-07-28 DIAGNOSIS — Z96649 Presence of unspecified artificial hip joint: Secondary | ICD-10-CM | POA: Diagnosis not present

## 2017-07-28 DIAGNOSIS — E039 Hypothyroidism, unspecified: Secondary | ICD-10-CM | POA: Diagnosis not present

## 2017-07-28 DIAGNOSIS — Z7901 Long term (current) use of anticoagulants: Secondary | ICD-10-CM | POA: Diagnosis not present

## 2017-07-28 DIAGNOSIS — I10 Essential (primary) hypertension: Secondary | ICD-10-CM | POA: Diagnosis not present

## 2017-07-28 DIAGNOSIS — D509 Iron deficiency anemia, unspecified: Secondary | ICD-10-CM | POA: Diagnosis not present

## 2017-07-28 DIAGNOSIS — E785 Hyperlipidemia, unspecified: Secondary | ICD-10-CM | POA: Diagnosis not present

## 2017-08-02 DIAGNOSIS — M722 Plantar fascial fibromatosis: Secondary | ICD-10-CM | POA: Diagnosis not present

## 2017-08-02 DIAGNOSIS — M6281 Muscle weakness (generalized): Secondary | ICD-10-CM | POA: Diagnosis not present

## 2017-08-02 DIAGNOSIS — R41841 Cognitive communication deficit: Secondary | ICD-10-CM | POA: Diagnosis not present

## 2017-08-02 DIAGNOSIS — R278 Other lack of coordination: Secondary | ICD-10-CM | POA: Diagnosis not present

## 2017-08-02 DIAGNOSIS — Z471 Aftercare following joint replacement surgery: Secondary | ICD-10-CM | POA: Diagnosis not present

## 2017-08-02 DIAGNOSIS — R2681 Unsteadiness on feet: Secondary | ICD-10-CM | POA: Diagnosis not present

## 2017-08-17 ENCOUNTER — Other Ambulatory Visit: Payer: Self-pay | Admitting: Cardiovascular Disease

## 2017-08-23 ENCOUNTER — Other Ambulatory Visit: Payer: Self-pay | Admitting: Infectious Disease

## 2017-09-01 ENCOUNTER — Encounter (INDEPENDENT_AMBULATORY_CARE_PROVIDER_SITE_OTHER): Payer: Self-pay | Admitting: Orthopaedic Surgery

## 2017-09-01 ENCOUNTER — Ambulatory Visit (INDEPENDENT_AMBULATORY_CARE_PROVIDER_SITE_OTHER): Payer: Medicare Other | Admitting: Orthopaedic Surgery

## 2017-09-01 DIAGNOSIS — S72142K Displaced intertrochanteric fracture of left femur, subsequent encounter for closed fracture with nonunion: Secondary | ICD-10-CM

## 2017-09-01 NOTE — Progress Notes (Signed)
Patient is 84 days status post conversion to cemented hemi-hip arthroplasty. She is doing well and has minimal pain. She ambulates with a cane. She has finished physical therapy. She has no complaints.  Surgical scar is fully healed. Leg lengths are equal.  Patient may begin to relax posterior hip precautions. Continue with home exercises for continued strengthening. Follow-up in 3 months for anticipated release at that time.

## 2017-09-25 ENCOUNTER — Other Ambulatory Visit: Payer: Self-pay | Admitting: Nurse Practitioner

## 2017-09-25 ENCOUNTER — Other Ambulatory Visit: Payer: Self-pay | Admitting: Internal Medicine

## 2017-09-28 DIAGNOSIS — H524 Presbyopia: Secondary | ICD-10-CM | POA: Diagnosis not present

## 2017-09-28 DIAGNOSIS — H35372 Puckering of macula, left eye: Secondary | ICD-10-CM | POA: Diagnosis not present

## 2017-09-28 DIAGNOSIS — H353132 Nonexudative age-related macular degeneration, bilateral, intermediate dry stage: Secondary | ICD-10-CM | POA: Diagnosis not present

## 2017-09-28 DIAGNOSIS — H04123 Dry eye syndrome of bilateral lacrimal glands: Secondary | ICD-10-CM | POA: Diagnosis not present

## 2017-10-10 ENCOUNTER — Other Ambulatory Visit: Payer: Self-pay | Admitting: Cardiovascular Disease

## 2017-10-16 ENCOUNTER — Ambulatory Visit (INDEPENDENT_AMBULATORY_CARE_PROVIDER_SITE_OTHER): Payer: Medicare Other | Admitting: Infectious Disease

## 2017-10-16 VITALS — BP 181/53 | HR 57 | Temp 98.0°F | Ht 63.0 in | Wt 130.0 lb

## 2017-10-16 DIAGNOSIS — Z96649 Presence of unspecified artificial hip joint: Secondary | ICD-10-CM

## 2017-10-16 DIAGNOSIS — T8450XD Infection and inflammatory reaction due to unspecified internal joint prosthesis, subsequent encounter: Secondary | ICD-10-CM | POA: Diagnosis not present

## 2017-10-16 DIAGNOSIS — T847XXD Infection and inflammatory reaction due to other internal orthopedic prosthetic devices, implants and grafts, subsequent encounter: Secondary | ICD-10-CM | POA: Diagnosis not present

## 2017-10-16 DIAGNOSIS — A498 Other bacterial infections of unspecified site: Secondary | ICD-10-CM

## 2017-10-16 DIAGNOSIS — T8459XD Infection and inflammatory reaction due to other internal joint prosthesis, subsequent encounter: Secondary | ICD-10-CM | POA: Diagnosis not present

## 2017-10-16 DIAGNOSIS — Z96643 Presence of artificial hip joint, bilateral: Secondary | ICD-10-CM | POA: Diagnosis not present

## 2017-10-16 DIAGNOSIS — Z23 Encounter for immunization: Secondary | ICD-10-CM | POA: Diagnosis not present

## 2017-10-16 MED ORDER — SULFAMETHOXAZOLE-TRIMETHOPRIM 400-80 MG PO TABS: 1.0000 | ORAL_TABLET | Freq: Two times a day (BID) | ORAL | 11 refills | Status: DC

## 2017-10-16 NOTE — Progress Notes (Signed)
Subjective:   Chief complaint: Not recovered strength in her left leg fully   [name], female    DOB: 1922/09/12, 83 y.o.   MRN: [id number]  HPI  Leah Holland is a 83 year old lady with admission in April of 2017 with right intertrochanteric proximal femur nonunion who progressed to ultimately break her intramedullary nail and elected for surgical management. She had removal of hardware including intramedullary nail, and treatment of intertrochanteric fracture with prosthesis placement. Dr. [name] got several intra-operative cultures due to concerrn for infection and two separate cultures grew serratia. Patient was placed on oral levaquin after discussion with me and has remained on this since then. She has done relatively well but recently began to complain of pain behind her prosthetic knees which she believed might be due to toxicity of fluoroquinolone to tendons after she and her son--who is a Pediatrician read about side effects of FQ.   I was  actually skeptical about FQ toxicity here but I DID think she would be better served over the long run if she is to be on chronic suppressive antibiotics NOT being on a fluoroquinolone given risk for Clostridium difficile colitis in particular. We changed her  over to single strength Bactrim and her tendon pain went away immediately. She has had normal creatinine and potassium while on single strength Bactrim.  She then  fractured her opposite hip and underwent IM nail by Dr. Erlinda Hong who is following her closely.    She ended up needing a THA on that side as well.   He has more discomfort on her left side where she cannot walk as well without a cane when she exerts herself.  Her right hip is not bothering her much at all and she is tolerating her antibiotics without problems.   Past Medical History:  Diagnosis Date  . Anxiety attack    "take RX prn" (08/13/2013)  . Arthritis    "probably" (08/13/2013)  . Atrial fibrillation  ([place])    new onset/pt report 08/13/2013  . Bronchitis 12/2011  . Complication of anesthesia   . Depression   . Diabetes mellitus type 2, diet-controlled ([place])    denies  . Femur fracture ([facility])    left proximal femur non-union  . GERD (gastroesophageal reflux disease)   . H/O hiatal hernia   . Hardware complicating wound infection ([place]) 08/22/2016  . History of blood transfusion    "w/both knee OR's" (08/13/2013)  . Hyperlipidemia   . Hyperlipidemia   . Hypertension   . Hypothyroidism    takes synthroid  . Insomnia   . Intertrochanteric fracture of right hip ([place]) 07/30/2015  . Neuromuscular disorder ([place])    carpal tunnel in right hand  . Periprosthetic fracture around internal prosthetic right hip joint ([place]), nonunion basicervical femoral neck with broken hardware and subtrochanteric fracture 03/16/2016  . PONV (postoperative nausea and vomiting)   . 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 Proteus infection 08/22/2016  . Serratia infection 11/14/2016  . Skin cancer of face 2000's   "one on each side of my face and one on my nose" (08/13/2013)  . Swelling of extremity    sees Dr. Gwenlyn Found, cardiologist 1 x year [phone]  . Tendon dysfunction 08/22/2016  . Wears hearing aid    [facility]    Past Surgical History:  Procedure Laterality Date  . 2D ECHOCARDIOGRAM  12/07/2004   EF 48%, moderate pulmonary hypertension  . BREAST BIOPSY Left ?  1970's  . CARDIOVASCULAR STRESS TEST  12/07/11  . CARDIOVASCULAR STRESS TEST  12/07/2011   LEXISCAN, normal scan, no significant wall abnormalities noted  . CARPAL TUNNEL RELEASE Right ~ 2005  . CATARACT EXTRACTION, BILATERAL Bilateral 2000  . DILATION AND CURETTAGE OF UTERUS  1950's  . DOPPLER ECHOCARDIOGRAPHY  12/07/11  . EYE SURGERY    . JOINT REPLACEMENT     bilateral knee replacement  . SKIN CANCER EXCISION  2000's   "off my nose" (08/13/2013)  . TOTAL KNEE ARTHROPLASTY Right 2005    Family History  Problem Relation Age of Onset  .  Arrhythmia Mother   . Heart disease Mother   . Arrhythmia Sister   . Stroke Father 62  . Heart disease Brother   . Heart disease Brother   . Cancer Brother        Colon cancer  . Arrhythmia Brother   . Heart disease Brother   . Heart disease Sister   . Heart disease Sister   . Stroke Sister   . Diabetes Sister   . Heart disease Child 27  . Heart disease Child 37      Social History   Socioeconomic History  . Marital status: Widowed    Spouse name: Not on file  . Number of children: Not on file  . Years of education: Not on file  . Highest education level: Not on file  Social Needs  . Financial resource strain: Not on file  . Food insecurity - worry: Not on file  . Food insecurity - inability: Not on file  . Transportation needs - medical: Not on file  . Transportation needs - non-medical: Not on file  Occupational History  . Not on file  Tobacco Use  . Smoking status: Never Smoker  . Smokeless tobacco: Never Used  Substance and Sexual Activity  . Alcohol use: No  . Drug use: No  . Sexual activity: No  Other Topics Concern  . Not on file  Social History Narrative   Widowed. Lives at [facility]. Son died in the [place] War and she has not slept well since then.   Admitted to [facility] 01/30/17   Never smoked   Alcohol none   Full Code    Allergies  Allergen Reactions  . Levaquin [Levofloxacin] Other (See Comments)    tendinopathy  . Morphine And Related Other (See Comments)    Made pt "wild"     Current Outpatient Medications:  .  acetaminophen (TYLENOL) 325 MG tablet, Take 650 mg by mouth every 6 (six) hours as needed for moderate pain. , Disp: , Rfl:  .  apixaban (ELIQUIS) 2.5 MG TABS tablet, Take 1 tablet by mouth twice daily, schedule MD appt for further refills, Disp: 60 tablet, Rfl: 0 .  aspirin EC 81 MG tablet, Take 81 mg by mouth daily., Disp: , Rfl:  .  atorvastatin (LIPITOR) 80 MG tablet, Take 80 mg by mouth daily., Disp: ,  Rfl:  .  denosumab (PROLIA) 60 MG/ML SOLN injection, Inject 60 mg into the skin every 6 (six) months. Administer in upper arm, thigh, or abdomen, Disp: , Rfl:  .  diltiazem (CARDIZEM CD) 180 MG 24 hr capsule, Take 1 capsule (180 mg total) by mouth daily., Disp: 30 capsule, Rfl: 10 .  DULoxetine (CYMBALTA) 30 MG capsule, Take 30 mg by mouth daily., Disp: , Rfl:  .  esomeprazole (NEXIUM) 20 MG capsule, Take 20 mg by mouth daily at 12  noon., Disp: , Rfl:  .  calcium carbonate (OS-CAL - DOSED IN MG OF ELEMENTAL CALCIUM) 1250 (500 Ca) MG tablet, Take 1 tablet by mouth every evening., Disp: , Rfl:  .  Cholecalciferol 2000 units CAPS, Take 2,000 Units by mouth every evening., Disp: , Rfl:  .  feeding supplement (BOOST HIGH PROTEIN) LIQD, Take 237 mLs by mouth 3 (three) times daily between meals. Prefers chocolate diabetic and peanut butter crackers before bedtime., Disp: , Rfl:  .  Flaxseed, Linseed, (FLAXSEED OIL) 1000 MG CAPS, Take 1,000 mg by mouth 2 (two) times daily. , Disp: , Rfl:  .  furosemide (LASIX) 20 MG tablet, Take 1 tablet (20 mg total) by mouth as needed for edema., Disp: 30 tablet, Rfl: 5 .  HYDROcodone-acetaminophen (NORCO) 7.5-325 MG tablet, Take 1-2 tablets by mouth every 6 (six) hours as needed for moderate pain., Disp: 90 tablet, Rfl: 0 .  levothyroxine (SYNTHROID, LEVOTHROID) 150 MCG tablet, Take 150 mcg by mouth daily before breakfast., Disp: , Rfl:  .  methocarbamol (ROBAXIN) 500 MG tablet, Take 1 tablet (500 mg total) by mouth every 8 (eight) hours as needed for muscle spasms., Disp: 15 tablet, Rfl: 0 .  metoprolol (LOPRESSOR) 50 MG tablet, Take 50 mg by mouth 2 (two) times daily., Disp: , Rfl:  .  Multiple Vitamins-Minerals (MULTIVITAMIN) tablet, Take 1 tablet by mouth 2 (two) times daily. , Disp: , Rfl:  .  ondansetron (ZOFRAN) 4 MG tablet, Take 4 mg by mouth every 8 (eight) hours as needed for nausea or vomiting., Disp: , Rfl:  .  polyethylene glycol (MIRALAX / GLYCOLAX) packet,  Take 17 g by mouth daily., Disp: 14 each, Rfl: 0 .  potassium chloride (K-DUR) 10 MEQ tablet, Take 10 mEq by mouth. Take one tablet as needed when giving lasix as needed, Disp: , Rfl:  .  saccharomyces boulardii (FLORASTOR) 250 MG capsule, Take 250 mg by mouth 2 (two) times daily., Disp: , Rfl:  .  senna-docusate (SENOKOT S) 8.6-50 MG tablet, Take 1 tablet by mouth at bedtime as needed., Disp: 30 tablet, Rfl: 1 .  sulfamethoxazole-trimethoprim (BACTRIM,SEPTRA) 400-80 MG tablet, Take 1 tablet 2 (two) times daily by mouth., Disp: 60 tablet, Rfl: 11 .  temazepam (RESTORIL) 30 MG capsule, Take 1 capsule (30 mg total) by mouth at bedtime as needed for sleep., Disp: 2 capsule, Rfl: 0   Review of Systems  Constitutional: Negative for activity change, appetite change, chills, diaphoresis and fever.  HENT: Negative for congestion and sore throat.   Respiratory: Negative for cough and wheezing.   Cardiovascular: Negative for leg swelling.  Gastrointestinal: Negative for blood in stool, nausea and vomiting.  Genitourinary: Negative for dysuria, flank pain and hematuria.  Musculoskeletal: Positive for arthralgias. Negative for back pain.  Skin: Negative for rash.  Neurological: Negative for dizziness, weakness and headaches.  Hematological: Does not bruise/bleed easily.  Psychiatric/Behavioral: The patient is not nervous/anxious.        Objective:   Physical Exam  Constitutional: She is oriented to person, place, and time. She appears well-developed and well-nourished. No distress.  HENT:  Head: Normocephalic and atraumatic.  Mouth/Throat: No oropharyngeal exudate.  Eyes: Conjunctivae and EOM are normal. No scleral icterus.  Neck: Normal range of motion. Neck supple.  Cardiovascular: Normal rate and regular rhythm.  Pulmonary/Chest: Effort normal. No respiratory distress. She has no wheezes.  Abdominal: She exhibits no distension.  Musculoskeletal: She exhibits no edema or tenderness.    Neurological: She is alert and  oriented to person, place, and time. She exhibits normal muscle tone. Coordination normal.  Skin: Skin is warm and dry. No rash noted. She is not diaphoretic. No erythema.  Psychiatric: She has a normal mood and affect. Her behavior is normal. Judgment normal.          Assessment & Plan:    Prosthetic hip infection: we did have several cases of serratia contaminating surgical specimens due to contaminated saline used in prep of SOME cultures done here at [facility]. However without certainty that this is a contaminant I need to keep her on antibiotics given risk esp at her age of recurrent PJI  continue Bactrim SS BID and she has tolerated this quite well. We will recheck labs again today I reviewed those from April which were normal  RTC in 6 months   THA on other side: still trying to recover full function

## 2017-10-17 LAB — BASIC METABOLIC PANEL WITH GFR
BUN / CREAT RATIO: 23 (calc) — AB (ref 6–22)
BUN: 22 mg/dL (ref 7–25)
CALCIUM: 10.3 mg/dL (ref 8.6–10.4)
CO2: 31 mmol/L (ref 20–32)
Chloride: 103 mmol/L (ref 98–110)
Creat: 0.94 mg/dL — ABNORMAL HIGH (ref 0.60–0.88)
GFR, EST AFRICAN AMERICAN: 61 mL/min/{1.73_m2} (ref >=60)
GFR, Est Non African American: 52 mL/min/{1.73_m2} — ABNORMAL LOW (ref >=60)
GLUCOSE: 88 mg/dL (ref 65–99)
Potassium: 4.7 mmol/L (ref 3.5–5.3)
SODIUM: 139 mmol/L (ref 135–146)

## 2017-10-17 LAB — CBC WITH DIFFERENTIAL/PLATELET
BASOS ABS: 28 {cells}/uL (ref 0–200)
Basophils Relative: 0.4 %
EOS ABS: 235 {cells}/uL (ref 15–500)
Eosinophils Relative: 3.4 %
HEMATOCRIT: 37.6 % (ref 35.0–45.0)
HEMOGLOBIN: 12.1 g/dL (ref 11.7–15.5)
LYMPHS ABS: 2374 {cells}/uL (ref 850–3900)
MCH: 27 pg (ref 27.0–33.0)
MCHC: 32.2 g/dL (ref 32.0–36.0)
MCV: 83.9 fL (ref 80.0–100.0)
MPV: 10.8 fL (ref 7.5–12.5)
Monocytes Relative: 9.2 %
NEUTROS ABS: 3629 {cells}/uL (ref 1500–7800)
Neutrophils Relative %: 52.6 %
Platelets: 239 10*3/uL (ref 140–400)
RBC: 4.48 10*6/uL (ref 3.80–5.10)
RDW: 13.8 % (ref 11.0–15.0)
Total Lymphocyte: 34.4 %
WBC mixed population: 635 cells/uL (ref 200–950)
WBC: 6.9 10*3/uL (ref 3.8–10.8)

## 2017-10-17 LAB — C-REACTIVE PROTEIN: CRP: 0.8 mg/L (ref <8.0)

## 2017-10-17 LAB — SEDIMENTATION RATE: Sed Rate: 2 mm/h (ref 0–30)

## 2017-11-06 ENCOUNTER — Other Ambulatory Visit: Payer: Self-pay | Admitting: Cardiovascular Disease

## 2017-11-06 NOTE — Telephone Encounter (Signed)
Rx has been sent to the pharmacy electronically. ° °

## 2017-11-07 ENCOUNTER — Telehealth: Payer: Self-pay | Admitting: Internal Medicine

## 2017-11-07 NOTE — Telephone Encounter (Signed)
Left message asking pt to schedule AWV-S w/ nurse at [facility] clinic on morning of 11/08/17 or on 11/10/17. VDM (DD)

## 2017-11-17 ENCOUNTER — Other Ambulatory Visit: Payer: Self-pay | Admitting: Cardiovascular Disease

## 2017-11-20 NOTE — Telephone Encounter (Signed)
I spoke with the patient's son, Devery Odwyer, to schedule her AWV, but he declined. VDM (DD)

## 2017-11-28 DIAGNOSIS — M5431 Sciatica, right side: Secondary | ICD-10-CM | POA: Diagnosis not present

## 2017-11-28 DIAGNOSIS — E785 Hyperlipidemia, unspecified: Secondary | ICD-10-CM | POA: Diagnosis not present

## 2017-11-28 DIAGNOSIS — Z23 Encounter for immunization: Secondary | ICD-10-CM | POA: Diagnosis not present

## 2017-11-28 DIAGNOSIS — D509 Iron deficiency anemia, unspecified: Secondary | ICD-10-CM | POA: Diagnosis not present

## 2017-11-28 DIAGNOSIS — Z96649 Presence of unspecified artificial hip joint: Secondary | ICD-10-CM | POA: Diagnosis not present

## 2017-11-28 DIAGNOSIS — E039 Hypothyroidism, unspecified: Secondary | ICD-10-CM | POA: Diagnosis not present

## 2017-11-28 DIAGNOSIS — I10 Essential (primary) hypertension: Secondary | ICD-10-CM | POA: Diagnosis not present

## 2017-11-28 DIAGNOSIS — Z7901 Long term (current) use of anticoagulants: Secondary | ICD-10-CM | POA: Diagnosis not present

## 2017-12-08 DIAGNOSIS — I10 Essential (primary) hypertension: Secondary | ICD-10-CM | POA: Diagnosis not present

## 2017-12-08 DIAGNOSIS — H900 Conductive hearing loss, bilateral: Secondary | ICD-10-CM | POA: Diagnosis not present

## 2017-12-08 DIAGNOSIS — M5431 Sciatica, right side: Secondary | ICD-10-CM | POA: Diagnosis not present

## 2017-12-08 DIAGNOSIS — Z96649 Presence of unspecified artificial hip joint: Secondary | ICD-10-CM | POA: Diagnosis not present

## 2017-12-11 ENCOUNTER — Other Ambulatory Visit: Payer: Self-pay | Admitting: Cardiovascular Disease

## 2017-12-18 ENCOUNTER — Ambulatory Visit (INDEPENDENT_AMBULATORY_CARE_PROVIDER_SITE_OTHER): Payer: Medicare Other | Admitting: Orthopaedic Surgery

## 2017-12-21 ENCOUNTER — Encounter (INDEPENDENT_AMBULATORY_CARE_PROVIDER_SITE_OTHER): Payer: Self-pay | Admitting: Orthopaedic Surgery

## 2017-12-21 ENCOUNTER — Ambulatory Visit (INDEPENDENT_AMBULATORY_CARE_PROVIDER_SITE_OTHER): Payer: Medicare Other | Admitting: Orthopaedic Surgery

## 2017-12-21 ENCOUNTER — Ambulatory Visit (INDEPENDENT_AMBULATORY_CARE_PROVIDER_SITE_OTHER): Payer: Medicare Other

## 2017-12-21 ENCOUNTER — Other Ambulatory Visit: Payer: Self-pay | Admitting: Infectious Disease

## 2017-12-21 DIAGNOSIS — M545 Low back pain, unspecified: Secondary | ICD-10-CM | POA: Insufficient documentation

## 2017-12-21 DIAGNOSIS — G8929 Other chronic pain: Secondary | ICD-10-CM

## 2017-12-21 NOTE — Progress Notes (Signed)
Office Visit Note   Patient: Leah Holland           Date of Birth: [birth year]           MRN: [id number] Visit Date: 12/21/2017              Requested by: Blanchie Serve, MD [address] [place], [place] [address] PCP: Blanchie Serve, MD   Assessment & Plan: Visit Diagnoses:  1. Chronic low back pain, unspecified back pain laterality, with sciatica presence unspecified     Plan: At this point we are going to obtain an MRI of the lumbar spine.  She will follow-up with [place] once this is completed.  At that point we will likely refer her to Dr. Ernestina Patches for epidural injections.  Follow-Up Instructions: Return for will fu with .   Orders:  Orders Placed This Encounter  Procedures  . XR Lumbar Spine 2-3 Views   No orders of the defined types were placed in this encounter.     Procedures: No procedures performed   Clinical Data: No additional findings.   Subjective: Chief Complaint  Patient presents with  . Left Hip - Pain  . Lower Back - Pain    HPI this is a pleasant 84 year old female who presents to our clinic today with right-sided lower back pain and right lower extremity radiculopathy.  This began 1 week before Christmas.  No known injury or change in activity.  The pain begins in her right buttocks radiating down the posterior aspect of her right lower leg into her foot.  Pain is worse with sitting.  It is a little better with standing.  She saw her primary care provider twice for this over the past few weeks who is prescribed her 2 different steroid Dosepaks.  These both significantly helped but did not last very long.  She is not having any weakness.  No bowel or bladder incontinence no saddle paresthesias.  Review of Systems as detailed in HPI.  All others reviewed and are negative.  Objective: Vital Signs: There were no vitals taken for this visit.  Physical Exam well-developed well-nourished female no acute distress.  Alert and oriented x3.  Ortho Exam  examination of her right lower extremity reveals markedly positive straight leg raise.  Negative logroll.  She does have some paraspinous tenderness on the right lumbar spine.  She is neurovascular intact distally.  Specialty Comments:  No specialty comments available.  Imaging: Xr Lumbar Spine 2-3 Views  Result Date: 12/21/2017 X-rays of the lumbar spine reveal marked multilevel degenerative disc disease.    PMFS History: Patient Active Problem List   Diagnosis Date Noted  . Chronic low back pain 12/21/2017  . Chronic antibiotic suppression 06/13/2017  . History of hip replacement 06/09/2017  . Ecchymosis 02/23/2017  . Closed intertrochanteric fracture with nonunion, left 01/26/2017  . Leukocytosis 01/26/2017  . Fall 01/26/2017  . Prosthetic hip infection ([place]) 08/22/2016  . Tendinopathy 08/22/2016  . Clinical depression 04/07/2016  . Arthritis of knee, degenerative 04/07/2016  . OP (osteoporosis) 04/07/2016  . 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 Chronic anticoagulation 08/06/2015  . Bipolar disorder ([place]) 08/04/2015  . Hyponatremia 08/04/2015  . Constipation 08/04/2015  . Complication of anesthesia 07/30/2015  . Diabetes mellitus type 2, diet-controlled ([place])   . Skin cancer of face   . Paroxysmal atrial fibrillation ([place]) 09/01/2014  . Right carotid bruit 11/18/2013  . Postoperative anemia due to acute blood loss 03/03/2012  . Hypertension   .  Hyperlipidemia   . Swelling of extremity   . Cataract   . Hypothyroidism   . Arthritis   . Insomnia    Past Medical History:  Diagnosis Date  . Anxiety attack    "take RX prn" (08/13/2013)  . Arthritis    "probably" (08/13/2013)  . Atrial fibrillation ([place])    new onset/pt report 08/13/2013  . Bronchitis 12/2011  . Complication of anesthesia   . Depression   . Diabetes mellitus type 2, diet-controlled ([place])    denies  . Femur fracture ([facility])    left proximal femur non-union  . GERD (gastroesophageal reflux  disease)   . H/O hiatal hernia   . Hardware complicating wound infection ([place]) 08/22/2016  . History of blood transfusion    "w/both knee OR's" (08/13/2013)  . Hyperlipidemia   . Hyperlipidemia   . Hypertension   . Hypothyroidism    takes synthroid  . Insomnia   . Intertrochanteric fracture of right hip ([place]) 07/30/2015  . Neuromuscular disorder ([place])    carpal tunnel in right hand  . Periprosthetic fracture around internal prosthetic right hip joint ([place]), nonunion basicervical femoral neck with broken hardware and subtrochanteric fracture 03/16/2016  . PONV (postoperative nausea and vomiting)   . 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 Proteus infection 08/22/2016  . Serratia infection 11/14/2016  . Skin cancer of face 2000's   "one on each side of my face and one on my nose" (08/13/2013)  . Swelling of extremity    sees Dr. Gwenlyn Found, cardiologist 1 x year [phone]  . Tendon dysfunction 08/22/2016  . Wears hearing aid    HOH    Family History  Problem Relation Age of Onset  . Arrhythmia Mother   . Heart disease Mother   . Arrhythmia Sister   . Stroke Father 34  . Heart disease Brother   . Heart disease Brother   . Cancer Brother        Colon cancer  . Arrhythmia Brother   . Heart disease Brother   . Heart disease Sister   . Heart disease Sister   . Stroke Sister   . Diabetes Sister   . Heart disease Child 37  . Heart disease Child 87    Past Surgical History:  Procedure Laterality Date  . 2D ECHOCARDIOGRAM  12/07/2004   EF 48%, moderate pulmonary hypertension  . BREAST BIOPSY Left ?1970's  . CARDIOVASCULAR STRESS TEST  12/07/11  . CARDIOVASCULAR STRESS TEST  12/07/2011   LEXISCAN, normal scan, no significant wall abnormalities noted  . CARPAL TUNNEL RELEASE Right ~ 2005  . CATARACT EXTRACTION, BILATERAL Bilateral 2000  . DILATION AND CURETTAGE OF UTERUS  1950's  . DOPPLER ECHOCARDIOGRAPHY  12/07/11  . EYE SURGERY    . FEMUR IM NAIL Right 08/01/2015   Procedure:  INTRAMEDULLARY (IM) NAIL FEMORAL;  Surgeon: Marchia Bond, MD;  Location: [facility];  Service: Orthopedics;  Laterality: Right;  . HARDWARE REMOVAL Right 03/16/2016   Procedure: HARDWARE REMOVAL;  Surgeon: Marchia Bond, MD;  Location: [facility];  Service: Orthopedics;  Laterality: Right;  . INTRAMEDULLARY (IM) NAIL INTERTROCHANTERIC Left 01/26/2017   Procedure: INTRAMEDULLARY (IM) NAIL INTERTROCHANTRIC;  Surgeon: Leandrew Koyanagi, MD;  Location: [facility];  Service: Orthopedics;  Laterality: Left;  . JOINT REPLACEMENT     bilateral knee replacement  . SKIN CANCER EXCISION  2000's   "off my nose" (08/13/2013)  . TOTAL HIP ARTHROPLASTY Right 03/16/2016   Procedure: TOTAL HIP ARTHROPLASTY;  Surgeon: Marchia Bond,  MD;  Location: [facility];  Service: Orthopedics;  Laterality: Right;  . TOTAL HIP ARTHROPLASTY Left 06/09/2017   Procedure: REMOVE LEFT HIP INTRAMEDULLARY NAIL AND CONVERT TO LEFT CEMENTED HEMI HIP ARTHROPLASTY;  Surgeon: Leandrew Koyanagi, MD;  Location: [facility];  Service: Orthopedics;  Laterality: Left;  . TOTAL KNEE ARTHROPLASTY Right 2005  . TOTAL KNEE ARTHROPLASTY  03/02/2012   Procedure: TOTAL KNEE ARTHROPLASTY;  Surgeon: Yvette Rack., MD;  Location: [facility];  Service: Orthopedics;  Laterality: Left;   Social History   Occupational History  . Not on file  Tobacco Use  . Smoking status: Never Smoker  . Smokeless tobacco: Never Used  Substance and Sexual Activity  . Alcohol use: No  . Drug use: No  . Sexual activity: No

## 2017-12-31 ENCOUNTER — Ambulatory Visit
Admission: RE | Admit: 2017-12-31 | Discharge: 2017-12-31 | Disposition: A | Discharge status: Home / Self Care | Payer: Medicare Other | Source: Ambulatory Visit | Attending: Orthopaedic Surgery | Admitting: Orthopaedic Surgery

## 2017-12-31 DIAGNOSIS — M48061 Spinal stenosis, lumbar region without neurogenic claudication: Secondary | ICD-10-CM | POA: Diagnosis not present

## 2017-12-31 DIAGNOSIS — M545 Low back pain: Principal | ICD-10-CM

## 2017-12-31 DIAGNOSIS — G8929 Other chronic pain: Secondary | ICD-10-CM

## 2017-12-31 NOTE — Progress Notes (Signed)
MRI

## 2018-01-02 ENCOUNTER — Ambulatory Visit (INDEPENDENT_AMBULATORY_CARE_PROVIDER_SITE_OTHER): Payer: Medicare Other | Admitting: Orthopaedic Surgery

## 2018-01-02 ENCOUNTER — Encounter (INDEPENDENT_AMBULATORY_CARE_PROVIDER_SITE_OTHER): Payer: Self-pay | Admitting: Orthopaedic Surgery

## 2018-01-02 DIAGNOSIS — M5417 Radiculopathy, lumbosacral region: Secondary | ICD-10-CM | POA: Diagnosis not present

## 2018-01-02 NOTE — Progress Notes (Signed)
Office Visit Note   Patient: Leah Holland           Date of Birth: 02/03/1922           MRN: [id number] Visit Date: 01/02/2018              Requested by: No referring provider defined for this encounter. PCP: Patient, No Pcp Per   Assessment & Plan: Visit Diagnoses:  1. Lumbosacral radiculopathy     Plan: Impression is 84 year old female with right-sided L5-S1 extraforaminal disc herniation.  We will referred to Dr. Ernestina Patches for epidural steroid injection.  MRI findings were discussed with the patient.  She is in agreement with the plan.  Follow-up with me as needed.  Follow-Up Instructions: Return if symptoms worsen or fail to improve.   Orders:  No orders of the defined types were placed in this encounter.  No orders of the defined types were placed in this encounter.     Procedures: No procedures performed   Clinical Data: No additional findings.   Subjective: No chief complaint on file.   Patient returns today for MRI review.  She continues to have right leg radiculopathy.  She is taking over-the-counter medicines which is overall controlling the pain.    Review of Systems   Objective: Vital Signs: There were no vitals taken for this visit.  Physical Exam  Ortho Exam Stable exam Specialty Comments:  No specialty comments available.  Imaging: No results found.   PMFS History: Patient Active Problem List   Diagnosis Date Noted  . Chronic low back pain 12/21/2017  . Chronic antibiotic suppression 06/13/2017  . History of hip replacement 06/09/2017  . Ecchymosis 02/23/2017  . Closed intertrochanteric fracture with nonunion, left 01/26/2017  . Leukocytosis 01/26/2017  . Fall 01/26/2017  . Prosthetic hip infection ([place]) 08/22/2016  . Tendinopathy 08/22/2016  . Clinical depression 04/07/2016  . Arthritis of knee, degenerative 04/07/2016  . OP (osteoporosis) 04/07/2016  . 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 Chronic anticoagulation  08/06/2015  . Bipolar disorder ([place]) 08/04/2015  . Hyponatremia 08/04/2015  . Constipation 08/04/2015  . Complication of anesthesia 07/30/2015  . Diabetes mellitus type 2, diet-controlled ([place])   . Skin cancer of face   . Paroxysmal atrial fibrillation ([place]) 09/01/2014  . Right carotid bruit 11/18/2013  . Postoperative anemia due to acute blood loss 03/03/2012  . Hypertension   . Hyperlipidemia   . Swelling of extremity   . Cataract   . Hypothyroidism   . Arthritis   . Insomnia    Past Medical History:  Diagnosis Date  . Anxiety attack    "take RX prn" (08/13/2013)  . Arthritis    "probably" (08/13/2013)  . Atrial fibrillation ([place])    new onset/pt report 08/13/2013  . Bronchitis 12/2011  . Complication of anesthesia   . Depression   . Diabetes mellitus type 2, diet-controlled ([place])    denies  . Femur fracture ([facility])    left proximal femur non-union  . GERD (gastroesophageal reflux disease)   . H/O hiatal hernia   . Hardware complicating wound infection ([place]) 08/22/2016  . History of blood transfusion    "w/both knee OR's" (08/13/2013)  . Hyperlipidemia   . Hyperlipidemia   . Hypertension   . Hypothyroidism    takes synthroid  . Insomnia   . Intertrochanteric fracture of right hip ([place]) 07/30/2015  . Neuromuscular disorder ([place])    carpal tunnel in right hand  . Periprosthetic fracture around internal  prosthetic right hip joint ([place]), nonunion basicervical femoral neck with broken hardware and subtrochanteric fracture 03/16/2016  . PONV (postoperative nausea and vomiting)   . 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 Proteus infection 08/22/2016  . Serratia infection 11/14/2016  . Skin cancer of face 2000's   "one on each side of my face and one on my nose" (08/13/2013)  . Swelling of extremity    sees Dr. Gwenlyn Found, cardiologist 1 x year [phone]  . Tendon dysfunction 08/22/2016  . Wears hearing aid    HOH    Family History  Problem Relation Age of Onset  . Arrhythmia  Mother   . Heart disease Mother   . Arrhythmia Sister   . Stroke Father 42  . Heart disease Brother   . Heart disease Brother   . Cancer Brother        Colon cancer  . Arrhythmia Brother   . Heart disease Brother   . Heart disease Sister   . Heart disease Sister   . Stroke Sister   . Diabetes Sister   . Heart disease Child 65  . Heart disease Child 67    Past Surgical History:  Procedure Laterality Date  . 2D ECHOCARDIOGRAM  12/07/2004   EF 48%, moderate pulmonary hypertension  . BREAST BIOPSY Left ?1970's  . CARDIOVASCULAR STRESS TEST  12/07/11  . CARDIOVASCULAR STRESS TEST  12/07/2011   LEXISCAN, normal scan, no significant wall abnormalities noted  . CARPAL TUNNEL RELEASE Right ~ 2005  . CATARACT EXTRACTION, BILATERAL Bilateral 2000  . DILATION AND CURETTAGE OF UTERUS  1950's  . DOPPLER ECHOCARDIOGRAPHY  12/07/11  . EYE SURGERY    . FEMUR IM NAIL Right 08/01/2015   Procedure: INTRAMEDULLARY (IM) NAIL FEMORAL;  Surgeon: Marchia Bond, MD;  Location: [facility];  Service: Orthopedics;  Laterality: Right;  . HARDWARE REMOVAL Right 03/16/2016   Procedure: HARDWARE REMOVAL;  Surgeon: Marchia Bond, MD;  Location: [facility];  Service: Orthopedics;  Laterality: Right;  . INTRAMEDULLARY (IM) NAIL INTERTROCHANTERIC Left 01/26/2017   Procedure: INTRAMEDULLARY (IM) NAIL INTERTROCHANTRIC;  Surgeon: Leandrew Koyanagi, MD;  Location: [facility];  Service: Orthopedics;  Laterality: Left;  . JOINT REPLACEMENT     bilateral knee replacement  . SKIN CANCER EXCISION  2000's   "off my nose" (08/13/2013)  . TOTAL HIP ARTHROPLASTY Right 03/16/2016   Procedure: TOTAL HIP ARTHROPLASTY;  Surgeon: Marchia Bond, MD;  Location: [facility];  Service: Orthopedics;  Laterality: Right;  . TOTAL HIP ARTHROPLASTY Left 06/09/2017   Procedure: REMOVE LEFT HIP INTRAMEDULLARY NAIL AND CONVERT TO LEFT CEMENTED HEMI HIP ARTHROPLASTY;  Surgeon: Leandrew Koyanagi, MD;  Location: [facility];  Service: Orthopedics;  Laterality: Left;  . TOTAL KNEE  ARTHROPLASTY Right 2005  . TOTAL KNEE ARTHROPLASTY  03/02/2012   Procedure: TOTAL KNEE ARTHROPLASTY;  Surgeon: Yvette Rack., MD;  Location: [facility];  Service: Orthopedics;  Laterality: Left;   Social History   Occupational History  . Not on file  Tobacco Use  . Smoking status: Never Smoker  . Smokeless tobacco: Never Used  Substance and Sexual Activity  . Alcohol use: No  . Drug use: No  . Sexual activity: No

## 2018-01-02 NOTE — Progress Notes (Signed)
Coming in today to discuss mri

## 2018-01-04 ENCOUNTER — Ambulatory Visit (INDEPENDENT_AMBULATORY_CARE_PROVIDER_SITE_OTHER): Payer: Medicare Other | Admitting: Orthopaedic Surgery

## 2018-01-04 ENCOUNTER — Ambulatory Visit (INDEPENDENT_AMBULATORY_CARE_PROVIDER_SITE_OTHER): Payer: Medicare Other

## 2018-01-04 ENCOUNTER — Ambulatory Visit (INDEPENDENT_AMBULATORY_CARE_PROVIDER_SITE_OTHER): Payer: Medicare Other | Admitting: Physical Medicine and Rehabilitation

## 2018-01-04 ENCOUNTER — Encounter (INDEPENDENT_AMBULATORY_CARE_PROVIDER_SITE_OTHER): Payer: Self-pay | Admitting: Physical Medicine and Rehabilitation

## 2018-01-04 VITALS — BP 165/77 | HR 58 | Temp 97.9°F

## 2018-01-04 DIAGNOSIS — M5416 Radiculopathy, lumbar region: Secondary | ICD-10-CM

## 2018-01-04 MED ORDER — BETAMETHASONE SOD PHOS & ACET 6 (3-3) MG/ML IJ SUSP
12.0000 mg | Freq: Once | INTRAMUSCULAR | Status: AC
  Administered 2018-01-04: 12 mg

## 2018-01-04 NOTE — Progress Notes (Deleted)
Pt states a sharp pain in lower back on the right side that radiates through the left leg. Pt states pain has been going on since Christmas 2018. Pt states sitting and laying down for too long makes pain worse, tylenol, advil, and heat makes pain better. +Driver, +BT(eliquis), -Dye Allergies.

## 2018-01-04 NOTE — Patient Instructions (Signed)

## 2018-01-05 NOTE — Procedures (Signed)
Leah Holland is a young appearing 84 year old female with severe right L5 radicular leg pain.  She has MRI findings of lateral foraminal disc herniation at L5 on the right consistent with her pain pattern.  She is present today with [name].  She has been having this pain since Christmas.  She reports that sitting and lying down for too long will make it worse but does endorse some relief with Advil and Tylenol as well as using the heating pad but really the pain is quite severe.  The patient does take Eliquis for anticoagulation.  We discussed at length with her and her son about continuing the Eliquis with a transforaminal epidural injection which has become the care risk standpoint.  We are going to complete a diagnostic and hopefully therapeutic right L5 transforaminal epidural steroid injection.  The injection  will be diagnostic and hopefully therapeutic. The patient has failed conservative care including time, medications and activity modification.  Lumbosacral Transforaminal Epidural Steroid Injection - Sub-Pedicular Approach with Fluoroscopic Guidance  Patient: Leah Holland      Date of Birth: 23-May-1922 MRN: [id number] PCP: Patient, No Pcp Per      Visit Date: 01/04/2018   Universal Protocol:    Date/Time: 01/04/2018  Consent Given By: the patient  Position: PRONE  Additional Comments: Vital signs were monitored before and after the procedure. Patient was prepped and draped in the usual sterile fashion. The correct patient, procedure, and site was verified.   Injection Procedure Details:  Procedure Site One Meds Administered:  Meds ordered this encounter  Medications  . betamethasone acetate-betamethasone sodium phosphate (CELESTONE) injection 12 mg    Laterality: Right  Location/Site:  L5-S1  Needle size: 22 G  Needle type: Spinal  Needle Placement: Transforaminal  Findings:    -Comments: Excellent flow of contrast along the  nerve and into the epidural space.  Procedure Details: After squaring off the end-plates to get a true AP view, the C-arm was positioned so that an oblique view of the foramen as noted above was visualized. The target area is just inferior to the "nose of the scotty dog" or sub pedicular. The soft tissues overlying this structure were infiltrated with 2-3 ml. of 1% Lidocaine without Epinephrine.  The spinal needle was inserted toward the target using a "trajectory" view along the fluoroscope beam.  Under AP and lateral visualization, the needle was advanced so it did not puncture dura and was located close the 6 O'Clock position of the pedical in AP tracterory. Biplanar projections were used to confirm position. Aspiration was confirmed to be negative for CSF and/or blood. A 1-2 ml. volume of Isovue-250 was injected and flow of contrast was noted at each level. Radiographs were obtained for documentation purposes.   After attaining the desired flow of contrast documented above, a 0.5 to 1.0 ml test dose of 0.25% Marcaine was injected into each respective transforaminal space.  The patient was observed for 90 seconds post injection.  After no sensory deficits were reported, and normal lower extremity motor function was noted,   the above injectate was administered so that equal amounts of the injectate were placed at each foramen (level) into the transforaminal epidural space.   Additional Comments:  The patient tolerated the procedure well Dressing: Band-Aid    Post-procedure details: Patient was observed during the procedure. Post-procedure instructions were reviewed.  Patient left the clinic in stable condition.   Pertinent Imaging: MRI LUMBAR SPINE WITHOUT CONTRAST  TECHNIQUE: Multiplanar, multisequence MR imaging of the lumbar spine was performed. No intravenous contrast was administered.  COMPARISON: Lumbar radiographs 12/21/2017  FINDINGS: Segmentation: Normal  segmentation  Alignment: Mild retrolisthesis T12-L1, L1-2, L2-3. Lumbar dextroscoliosis  Vertebrae: Negative for fracture or mass. Negative for pars defect.  Conus medullaris and cauda equina: Conus extends to the L1-2 level. Conus and cauda equina appear normal.  Paraspinal and other soft tissues: Negative  Disc levels:  L1-2: Mild retrolisthesis. Mild disc degeneration and spurring. No significant spinal stenosis. Neural foramina patent.  L2-3: Mild retrolisthesis. Mild disc degeneration with disc bulging and diffuse endplate spurring. Mild facet hypertrophy bilaterally. Mild left foraminal narrowing. Spinal canal adequate in size.  L3-4: Mild disc and facet degeneration without stenosis  L4-5: Mild disc degeneration and moderate facet degeneration. No significant spinal or foraminal stenosis.  L5-S1: Diffuse bulging of the disc. Small annular fissure centrally. Small extraforaminal disc protrusion on the right with impingement of the right L5 nerve root. Moderate facet hypertrophy bilaterally. Mild to moderate right foraminal stenosis.  IMPRESSION: Mild left foraminal narrowing L2-3 due to spurring  Small extraforaminal disc protrusion on the right L5-S1 with impingement of the right L5 nerve root. Mild-to-moderate right foraminal stenosis due to spurring.   Electronically Signed By: Franchot Gallo M.D. On: 12/31/2017 14:04

## 2018-01-08 ENCOUNTER — Other Ambulatory Visit: Payer: Self-pay

## 2018-01-08 DIAGNOSIS — M810 Age-related osteoporosis without current pathological fracture: Secondary | ICD-10-CM

## 2018-01-08 DIAGNOSIS — E039 Hypothyroidism, unspecified: Secondary | ICD-10-CM

## 2018-01-08 DIAGNOSIS — E119 Type 2 diabetes mellitus without complications: Secondary | ICD-10-CM

## 2018-01-09 ENCOUNTER — Other Ambulatory Visit: Payer: Medicare Other

## 2018-01-10 ENCOUNTER — Other Ambulatory Visit: Payer: Self-pay | Admitting: Cardiovascular Disease

## 2018-01-10 DIAGNOSIS — E559 Vitamin D deficiency, unspecified: Secondary | ICD-10-CM | POA: Diagnosis not present

## 2018-01-10 DIAGNOSIS — M810 Age-related osteoporosis without current pathological fracture: Secondary | ICD-10-CM | POA: Diagnosis not present

## 2018-01-15 ENCOUNTER — Encounter (INDEPENDENT_AMBULATORY_CARE_PROVIDER_SITE_OTHER): Payer: Medicare Other | Admitting: Physical Medicine and Rehabilitation

## 2018-01-17 ENCOUNTER — Telehealth (INDEPENDENT_AMBULATORY_CARE_PROVIDER_SITE_OTHER): Payer: Self-pay | Admitting: Physical Medicine and Rehabilitation

## 2018-01-17 NOTE — Telephone Encounter (Signed)
Repeat but will add facet block as well

## 2018-01-17 NOTE — Telephone Encounter (Signed)
Please advise. Right L5 TF on 1/24.

## 2018-01-17 NOTE — Telephone Encounter (Signed)
Patients son called stating that the injection his mother received from Dr. Ernestina Patches helped for a few weeks but now she's in extreme pain and would like to schedule another injection if possible. CB # [phone]

## 2018-01-17 NOTE — Telephone Encounter (Signed)
Scheduled for 2/14 at 0945- added to wait list.

## 2018-01-22 ENCOUNTER — Encounter (INDEPENDENT_AMBULATORY_CARE_PROVIDER_SITE_OTHER): Payer: Self-pay | Admitting: Physical Medicine and Rehabilitation

## 2018-01-22 ENCOUNTER — Ambulatory Visit (INDEPENDENT_AMBULATORY_CARE_PROVIDER_SITE_OTHER): Payer: Medicare Other | Admitting: Physical Medicine and Rehabilitation

## 2018-01-22 ENCOUNTER — Ambulatory Visit (INDEPENDENT_AMBULATORY_CARE_PROVIDER_SITE_OTHER): Payer: Self-pay

## 2018-01-22 VITALS — BP 168/83 | HR 64 | Temp 98.2°F

## 2018-01-22 DIAGNOSIS — M47816 Spondylosis without myelopathy or radiculopathy, lumbar region: Secondary | ICD-10-CM

## 2018-01-22 DIAGNOSIS — M5416 Radiculopathy, lumbar region: Secondary | ICD-10-CM | POA: Diagnosis not present

## 2018-01-22 MED ORDER — BETAMETHASONE SOD PHOS & ACET 6 (3-3) MG/ML IJ SUSP
12.0000 mg | Freq: Once | INTRAMUSCULAR | Status: AC
  Administered 2018-01-22: 12 mg

## 2018-01-22 NOTE — Patient Instructions (Addendum)
[employer] Discharge Instructions  *At any time if you have questions or concerns they can be answered by calling [phone]  All Patients: . You may experience an increase in your symptoms for the first 2 days (it can take 2 days to 2 weeks for the steroid/cortisone to have its maximal effect). . You may use ice to the site for the first 24 hours; 20 minutes on and 20 minutes off and may use heat after that time. . You may resume and continue your current pain medications. If you need a refill please contact the prescribing physician. . You may resume her regular medications if any were stopped for the procedure. . You may shower but no swimming, tub bath or Jacuzzi for 24 hours. . Please remove bandage after 4 hours. . You may resume light activities as tolerated. . If you had Spine Injection, you should not drive for the next 3 hours due to anesthetics used in the procedure. Please have someone drive for you.  *If you have had sedation, Valium, Xanax, or lorazepam: Do not drive or use public transportation for 24 hours, do not operating hazardous machinery or make important personal/business decisions for 24 hours.  POSSIBLE STEROID SIDE EFFECTS: If experienced these should only last for a short period. Change in menstrual flow  Edema in (swelling)  Increased appetite Skin flushing (redness)  Skin rash/acne  Thrush (oral) Vaginitis    Increased sweating  Depression Increased blood glucose levels Cramping and leg/calf  Euphoria (feeling happy)  POSSIBLE PROCEDURE SIDE EFFECTS: Please call our office if concerned. Increased pain Increased numbness/tingling  Headache Nausea/vomiting Hematoma (bruising/bleeding) Edema (swelling at the site) Weakness  Infection (red/drainage at site) Fever greater than 100.107F  *In the event of a headache after epidural steroid injection: Drink plenty of fluids, especially water and try to lay flat when possible. If the headache does  not get better after a few days or as always if concerned please call the office.  .fnd

## 2018-01-22 NOTE — Progress Notes (Signed)
Pt states an aching pain in right leg with some tingling in right lower leg. Pt states previous shot lasted for a week. Pt pain has been there for about 2 weeks. Pt states pain is there most of the time, walking helps ease the pain. +Driver, -BT(eliquis), -Dye Allergies.

## 2018-01-25 ENCOUNTER — Encounter (INDEPENDENT_AMBULATORY_CARE_PROVIDER_SITE_OTHER): Payer: Medicare Other | Admitting: Physical Medicine and Rehabilitation

## 2018-02-05 ENCOUNTER — Other Ambulatory Visit: Payer: Self-pay | Admitting: Cardiovascular Disease

## 2018-02-08 DIAGNOSIS — Z822 Family history of deafness and hearing loss: Secondary | ICD-10-CM | POA: Diagnosis not present

## 2018-02-08 DIAGNOSIS — H9313 Tinnitus, bilateral: Secondary | ICD-10-CM | POA: Diagnosis not present

## 2018-02-08 DIAGNOSIS — Z570 Occupational exposure to noise: Secondary | ICD-10-CM | POA: Diagnosis not present

## 2018-02-08 DIAGNOSIS — H903 Sensorineural hearing loss, bilateral: Secondary | ICD-10-CM | POA: Diagnosis not present

## 2018-02-15 ENCOUNTER — Telehealth (INDEPENDENT_AMBULATORY_CARE_PROVIDER_SITE_OTHER): Payer: Self-pay | Admitting: Physical Medicine and Rehabilitation

## 2018-02-15 NOTE — Telephone Encounter (Signed)
Scheduled for 03/05/18 at 1515 with driver.

## 2018-02-15 NOTE — Telephone Encounter (Signed)
Yes ok 

## 2018-02-21 ENCOUNTER — Encounter (INDEPENDENT_AMBULATORY_CARE_PROVIDER_SITE_OTHER): Payer: Self-pay | Admitting: Physical Medicine and Rehabilitation

## 2018-02-21 ENCOUNTER — Ambulatory Visit (INDEPENDENT_AMBULATORY_CARE_PROVIDER_SITE_OTHER): Payer: Medicare Other

## 2018-02-21 ENCOUNTER — Ambulatory Visit (INDEPENDENT_AMBULATORY_CARE_PROVIDER_SITE_OTHER): Payer: Medicare Other | Admitting: Physical Medicine and Rehabilitation

## 2018-02-21 VITALS — BP 152/71 | HR 61 | Temp 97.9°F

## 2018-02-21 DIAGNOSIS — M47816 Spondylosis without myelopathy or radiculopathy, lumbar region: Secondary | ICD-10-CM | POA: Diagnosis not present

## 2018-02-21 DIAGNOSIS — M5416 Radiculopathy, lumbar region: Secondary | ICD-10-CM | POA: Diagnosis not present

## 2018-02-21 MED ORDER — BETAMETHASONE SOD PHOS & ACET 6 (3-3) MG/ML IJ SUSP
12.0000 mg | Freq: Once | INTRAMUSCULAR | Status: AC
  Administered 2018-02-21: 12 mg

## 2018-02-21 NOTE — Patient Instructions (Signed)

## 2018-02-21 NOTE — Progress Notes (Signed)
.  Numeric Pain Rating Scale and Functional Assessment Average Pain 6   In the last MONTH (on 0-10 scale) has pain interfered with the following?  1. General activity like being  able to carry out your everyday physical activities such as walking, climbing stairs, carrying groceries, or moving a chair?  Rating(4)   +Driver, +BT(eliquis), -Dye Allergies.

## 2018-02-26 DIAGNOSIS — E039 Hypothyroidism, unspecified: Secondary | ICD-10-CM | POA: Diagnosis not present

## 2018-02-26 DIAGNOSIS — E785 Hyperlipidemia, unspecified: Secondary | ICD-10-CM | POA: Diagnosis not present

## 2018-02-26 DIAGNOSIS — I10 Essential (primary) hypertension: Secondary | ICD-10-CM | POA: Diagnosis not present

## 2018-02-26 DIAGNOSIS — Z23 Encounter for immunization: Secondary | ICD-10-CM | POA: Diagnosis not present

## 2018-03-02 ENCOUNTER — Ambulatory Visit (INDEPENDENT_AMBULATORY_CARE_PROVIDER_SITE_OTHER): Payer: Medicare Other | Admitting: Cardiovascular Disease

## 2018-03-02 ENCOUNTER — Encounter: Payer: Self-pay | Admitting: Cardiovascular Disease

## 2018-03-02 VITALS — BP 148/60 | HR 60 | Ht 63.0 in | Wt 133.0 lb

## 2018-03-02 DIAGNOSIS — E7800 Pure hypercholesterolemia, unspecified: Secondary | ICD-10-CM

## 2018-03-02 DIAGNOSIS — I480 Paroxysmal atrial fibrillation: Secondary | ICD-10-CM | POA: Diagnosis not present

## 2018-03-02 DIAGNOSIS — I10 Essential (primary) hypertension: Secondary | ICD-10-CM | POA: Diagnosis not present

## 2018-03-02 NOTE — Assessment & Plan Note (Signed)
History of essential hypertension with blood pressure measured 148/60.She is on diltiazem and metoprolol . Continue current meds at current dosing

## 2018-03-02 NOTE — Patient Instructions (Signed)

## 2018-03-02 NOTE — Assessment & Plan Note (Signed)
History of paroxysmal atrial fibrillation maintaining sinus rhythm on Eliquis oral anticoagulation. 

## 2018-03-02 NOTE — Assessment & Plan Note (Signed)
And history of hyperlipidemia not on statin therapy followed by her PCP

## 2018-03-02 NOTE — Progress Notes (Signed)
03/02/2018 Leah Holland   1922-06-08  [id number]  Primary Physician Patient, No Pcp Per Primary Cardiologist: Lorretta Harp MD FACP, [place], [place], [place]  HPI:  Leah Holland is a 84 y.o.  widowed, Caucasian female, mother of two living children (one deceased), grandmother to five grandchildren, who I last saw 09/06/16. She is the youngest of 10 children. Her sister, [name] was also placed in mind that has passed away. She had one son who died again on, one son who is now a retired Lexicographer in one son who was a Biomedical scientist in [employer], , is currently retired and who is accompanying her today.. She has a history of hypertension and hyperlipidemia, as well as family history of heart disease. She had a remote right total knee replacement and a recent left one by Dr. French Ana. She had a Myoview stress test performed in our office on August 07, 2011, which was low risk. Recent lab work performed by Dr. Greta Doom revealed a total cholesterol of 133, HDL of 71, and HDL of 39.  Patient presented with apparent new onset atrial fibrillation with RVR. She was just seen [facility] on 08/03/2013 for chest pain. She was diagnosed with gastroesophageal reflux disease and a UTI. At that time she was in normal sinus rhythm at a rate of 79 beats per minute and with PACs. She reported dizziness, feeling tired and a racing HR since ~Sat/Sun. She otherwise denied nausea, vomiting, fever, chest pain, dizziness, PND, cough, congestion, abdominal pain, hematochezia, melena, lower extremity edema, claudication. No recent GIB.  She was admitted and started on IV diltiazem 5mg /hr and Eliquis 2.5mg  twice daily. She converted to NSR at 0246hrs. She was changed to PO Cardizem 120mg  daily. She's been taking lasix 60mg  daily for LEE for a long time. This was changed to 20mg  daily as needed a long with 62meq as needed potassium.   Her major issues have patient with recurrent right and left  hip fractures requiring surgical intervention. She denies chest pain or shortness of breath.     Current Meds  Medication Sig  . acetaminophen (TYLENOL) 325 MG tablet Take 650 mg by mouth every 6 (six) hours as needed for moderate pain.   Marland Kitchen aspirin EC 81 MG tablet Take 81 mg by mouth daily.  Marland Kitchen atorvastatin (LIPITOR) 80 MG tablet Take 80 mg by mouth daily.  . calcium carbonate (OS-CAL - DOSED IN MG OF ELEMENTAL CALCIUM) 1250 (500 Ca) MG tablet Take 1 tablet by mouth every evening.  . Cholecalciferol 2000 units CAPS Take 2,000 Units by mouth every evening.  . denosumab (PROLIA) 60 MG/ML SOLN injection Inject 60 mg into the skin every 6 (six) months. Administer in upper arm, thigh, or abdomen  . diltiazem (CARDIZEM CD) 180 MG 24 hr capsule TAKE 1 CAPSULE DAILY.  . DULoxetine (CYMBALTA) 60 MG capsule Take 60 mg by mouth daily.   Marland Kitchen ELIQUIS 2.5 MG TABS tablet TAKE 1 TABLET BY MOUTH TWICE DAILY.  Marland Kitchen esomeprazole (NEXIUM) 20 MG capsule Take 20 mg by mouth daily at 12 noon.  . feeding supplement (BOOST HIGH PROTEIN) LIQD Take 237 mLs by mouth 3 (three) times daily between meals. Prefers chocolate diabetic and peanut butter crackers before bedtime.  . Flaxseed, Linseed, (FLAXSEED OIL) 1000 MG CAPS Take 1,000 mg by mouth 2 (two) times daily.   . furosemide (LASIX) 20 MG tablet Take 1 tablet (20 mg total) by mouth as needed for edema.  Marland Kitchen  HYDROcodone-acetaminophen (NORCO) 7.5-325 MG tablet Take 1-2 tablets by mouth every 6 (six) hours as needed for moderate pain.  Marland Kitchen levothyroxine (SYNTHROID, LEVOTHROID) 150 MCG tablet Take 150 mcg by mouth daily before breakfast.  . methocarbamol (ROBAXIN) 500 MG tablet Take 1 tablet (500 mg total) by mouth every 8 (eight) hours as needed for muscle spasms.  . metoprolol (LOPRESSOR) 50 MG tablet Take 50 mg by mouth 2 (two) times daily.  . Multiple Vitamins-Minerals (MULTIVITAMIN) tablet Take 1 tablet by mouth 2 (two) times daily.   . ondansetron (ZOFRAN) 4 MG tablet Take  4 mg by mouth every 8 (eight) hours as needed for nausea or vomiting.  . polyethylene glycol (MIRALAX / GLYCOLAX) packet Take 17 g by mouth daily.  . potassium chloride (K-DUR) 10 MEQ tablet Take 10 mEq by mouth. Take one tablet as needed when giving lasix as needed  . saccharomyces boulardii (FLORASTOR) 250 MG capsule Take 250 mg by mouth 2 (two) times daily.  Marland Kitchen senna-docusate (SENOKOT S) 8.6-50 MG tablet Take 1 tablet by mouth at bedtime as needed.  . sulfamethoxazole-trimethoprim (BACTRIM,SEPTRA) 400-80 MG tablet Take 1 tablet 2 (two) times daily by mouth.  . sulfamethoxazole-trimethoprim (BACTRIM,SEPTRA) 400-80 MG tablet TAKE 1 TABLET BY MOUTH TWICE DAILY.  Marland Kitchen temazepam (RESTORIL) 30 MG capsule Take 1 capsule (30 mg total) by mouth at bedtime as needed for sleep.     Allergies  Allergen Reactions  . Levaquin [Levofloxacin] Other (See Comments)    tendinopathy  . Morphine And Related Other (See Comments)    Made pt "wild"    Social History   Socioeconomic History  . Marital status: Widowed    Spouse name: Not on file  . Number of children: Not on file  . Years of education: Not on file  . Highest education level: Not on file  Occupational History  . Not on file  Social Needs  . Financial resource strain: Not on file  . Food insecurity:    Worry: Not on file    Inability: Not on file  . Transportation needs:    Medical: Not on file    Non-medical: Not on file  Tobacco Use  . Smoking status: Never Smoker  . Smokeless tobacco: Never Used  Substance and Sexual Activity  . Alcohol use: No  . Drug use: No  . Sexual activity: Never  Lifestyle  . Physical activity:    Days per week: Not on file    Minutes per session: Not on file  . Stress: Not on file  Relationships  . Social connections:    Talks on phone: Not on file    Gets together: Not on file    Attends religious service: Not on file    Active member of club or organization: Not on file    Attends meetings of  clubs or organizations: Not on file    Relationship status: Not on file  . Intimate partner violence:    Fear of current or ex partner: Not on file    Emotionally abused: Not on file    Physically abused: Not on file    Forced sexual activity: Not on file  Other Topics Concern  . Not on file  Social History Narrative   Widowed. Lives at [facility] Son died in the [place] War and she has not slept well since then.   Admitted to [facility] 01/30/17   Never smoked   Alcohol none   Full Code  Review of Systems: General: negative for chills, fever, night sweats or weight changes.  Cardiovascular: negative for chest pain, dyspnea on exertion, edema, orthopnea, palpitations, paroxysmal nocturnal dyspnea or shortness of breath Dermatological: negative for rash Respiratory: negative for cough or wheezing Urologic: negative for hematuria Abdominal: negative for nausea, vomiting, diarrhea, bright red blood per rectum, melena, or hematemesis Neurologic: negative for visual changes, syncope, or dizziness All other systems reviewed and are otherwise negative except as noted above.    Blood pressure (!) 148/60, pulse 60, height 5\' 3"  (1.6 m), weight 133 lb (60.3 kg).  General appearance: alert and no distress Neck: no adenopathy, no carotid bruit, no JVD, supple, symmetrical, trachea midline and thyroid not enlarged, symmetric, no tenderness/mass/nodules Lungs: clear to auscultation bilaterally Heart: regular rate and rhythm, S1, S2 normal, no murmur, click, rub or gallop Extremities: extremities normal, atraumatic, no cyanosis or edema Pulses: 2+ and symmetric Skin: Skin color, texture, turgor normal. No rashes or lesions Neurologic: Alert and oriented X 3, normal strength and tone. Normal symmetric reflexes. Normal coordination and gait  EKG sinus rhythm at 60 without ST or T-wave changes.I  Personally reviewed this EKG  ASSESSMENT AND PLAN:   Hypertension History  of essential hypertension with blood pressure measured 148/60.She is on diltiazem and metoprolol . Continue current meds at current dosing  Hyperlipidemia And history of hyperlipidemia not on statin therapy followed by her PCP  Paroxysmal atrial fibrillation (HCC) History of paroxysmal atrial fibrillation maintaining sinus rhythm on Eliquis  oral anticoagulation.      Lorretta Harp MD FACP,FACC,FAHA, [facility] 03/02/2018 10:32 AM

## 2018-03-05 ENCOUNTER — Encounter (INDEPENDENT_AMBULATORY_CARE_PROVIDER_SITE_OTHER): Payer: Medicare Other | Admitting: Physical Medicine and Rehabilitation

## 2018-03-06 ENCOUNTER — Other Ambulatory Visit: Payer: Self-pay | Admitting: Cardiovascular Disease

## 2018-03-09 NOTE — Procedures (Signed)
Lumbosacral Transforaminal Epidural Steroid Injection - Sub-Pedicular Approach with Fluoroscopic Guidance  Patient: Leah Holland      Date of Birth: 12-17-1921 MRN: [id number] PCP: Patient, No Pcp Per      Visit Date: 02/21/2018   Universal Protocol:    Date/Time: 02/21/2018  Consent Given By: the patient  Position: PRONE  Additional Comments: Vital signs were monitored before and after the procedure. Patient was prepped and draped in the usual sterile fashion. The correct patient, procedure, and site was verified.   Injection Procedure Details:  Procedure Site One Meds Administered:  Meds ordered this encounter  Medications  . betamethasone acetate-betamethasone sodium phosphate (CELESTONE) injection 12 mg    Laterality: Right  Location/Site:  L5-S1  Needle size: 22 G  Needle type: Spinal  Needle Placement: Transforaminal  Findings:    -Comments: Excellent flow of contrast along the nerve and into the epidural space.  Procedure Details: After squaring off the end-plates to get a true AP view, the C-arm was positioned so that an oblique view of the foramen as noted above was visualized. The target area is just inferior to the "nose of the scotty dog" or sub pedicular. The soft tissues overlying this structure were infiltrated with 2-3 ml. of 1% Lidocaine without Epinephrine.  The spinal needle was inserted toward the target using a "trajectory" view along the fluoroscope beam.  Under AP and lateral visualization, the needle was advanced so it did not puncture dura and was located close the 6 O'Clock position of the pedical in AP tracterory. Biplanar projections were used to confirm position. Aspiration was confirmed to be negative for CSF and/or blood. A 1-2 ml. volume of Isovue-250 was injected and flow of contrast was noted at each level. Radiographs were obtained for documentation purposes.   After attaining the desired flow of contrast documented above, a 0.5 to  1.0 ml test dose of 0.25% Marcaine was injected into each respective transforaminal space.  The patient was observed for 90 seconds post injection.  After no sensory deficits were reported, and normal lower extremity motor function was noted,   the above injectate was administered so that equal amounts of the injectate were placed at each foramen (level) into the transforaminal epidural space.   Additional Comments:  The patient tolerated the procedure well Dressing: Band-Aid    Post-procedure details: Patient was observed during the procedure. Post-procedure instructions were reviewed.  Patient left the clinic in stable condition. Lumbar Facet Joint Intra-Articular Injection(s) with Fluoroscopic Guidance  Patient: Leah Holland      Date of Birth: 1922-10-31 MRN: [id number] PCP: Patient, No Pcp Per      Visit Date: 02/21/2018   Universal Protocol:    Date/Time: 02/21/2018  Consent Given By: the patient  Position: PRONE   Additional Comments: Vital signs were monitored before and after the procedure. Patient was prepped and draped in the usual sterile fashion. The correct patient, procedure, and site was verified.   Injection Procedure Details:  Procedure Site One Meds Administered:  Meds ordered this encounter  Medications  . betamethasone acetate-betamethasone sodium phosphate (CELESTONE) injection 12 mg     Laterality: Right  Location/Site:  L5-S1  Needle size: 22 guage  Needle type: Spinal  Needle Placement: Articular  Findings:  -Comments: Excellent flow of contrast producing a partial arthrogram.  Procedure Details: The fluoroscope beam is vertically oriented in AP, and the inferior recess is visualized beneath the lower pole of the inferior apophyseal process, which represents the target  point for needle insertion. When direct visualization is difficult the target point is located at the medial projection of the vertebral pedicle. The region overlying  each aforementioned target is locally anesthetized with a 1 to 2 ml. volume of 1% Lidocaine without Epinephrine.   The spinal needle was inserted into each of the above mentioned facet joints using biplanar fluoroscopic guidance. A 0.25 to 0.5 ml. volume of Isovue-250 was injected and a partial facet joint arthrogram was obtained. A single spot film was obtained of the resulting arthrogram.    One to 1.25 ml of the steroid/anesthetic solution was then injected into each of the facet joints noted above.   Additional Comments:  The patient tolerated the procedure well Dressing: Band-Aid    Post-procedure details: Patient was observed during the procedure. Post-procedure instructions were reviewed.  Patient left the clinic in stable condition.

## 2018-03-09 NOTE — Progress Notes (Signed)
Leah Holland - 84 y.o. female MRN [id number]  Date of birth: 06-23-1922  Office Visit Note: Visit Date: 02/21/2018 PCP: Patient, No Pcp Per Referred by: No ref. provider found  Subjective: Chief Complaint  Patient presents with  . Lower Back - Pain  . Right Leg - Pain  . Right Foot - Tingling   HPI: Leah Holland is a very pleasant 84 year old female who once again is accompanied by her son who provides some of the history.  We have completed an epidural injection from an intralaminar approach which was somewhat successful just for a day or so and then we ultimately completed a transforaminal injection along with facet joint block for what is essentially significant arthritis at L5-S1 with narrowing of the foramen and lateral recess.  She actually did quite well with that injection particularly at first and she still having overall much better pain relief than she had when we first saw her.  We are going to repeat this injection today and see how she does.  Depending on level of relief and how she is functioning could look at medication trials although difficult at 84 year old patient.  Her son is a pediatrician and can obviously help with that.  Hopefully she gets enough relief that she is doing well and I think a lot of this may resolve over time.  They also report that she had a fall on February 16, 2018 with some bruising of the right thigh which was fairly extensive.  Her son is not very concerned about it but the other son who sees her was concerned because she has had a prior hip replacement on the right.  We did obtain a quick x-ray of her right hip showing well-placed arthroplasty without any loosening or fracture.   ROS Otherwise per HPI.  Assessment & Plan: Visit Diagnoses:  1. Lumbar radiculopathy     Plan: No additional findings.   Meds & Orders:  Meds ordered this encounter  Medications  . betamethasone acetate-betamethasone sodium phosphate (CELESTONE) injection 12 mg      Orders Placed This Encounter  Procedures  . XR C-ARM NO REPORT  . Epidural Steroid injection    Follow-up: Return if symptoms worsen or fail to improve.   Procedures: No procedures performed  Lumbosacral Transforaminal Epidural Steroid Injection - Sub-Pedicular Approach with Fluoroscopic Guidance  Patient: [name]      Date of Birth: [birth year] MRN: [id number] PCP: Patient, No Pcp Per      Visit Date: 02/21/2018   Universal Protocol:    Date/Time: 02/21/2018  Consent Given By: the patient  Position: PRONE  Additional Comments: Vital signs were monitored before and after the procedure. Patient was prepped and draped in the usual sterile fashion. The correct patient, procedure, and site was verified.   Injection Procedure Details:  Procedure Site One Meds Administered:  Meds ordered this encounter  Medications  . betamethasone acetate-betamethasone sodium phosphate (CELESTONE) injection 12 mg    Laterality: Right  Location/Site:  L5-S1  Needle size: 22 G  Needle type: Spinal  Needle Placement: Transforaminal  Findings:    -Comments: Excellent flow of contrast along the nerve and into the epidural space.  Procedure Details: After squaring off the end-plates to get a true AP view, the C-arm was positioned so that an oblique view of the foramen as noted above was visualized. The target area is just inferior to the "nose of the scotty dog" or sub pedicular. The soft tissues overlying this structure  were infiltrated with 2-3 ml. of 1% Lidocaine without Epinephrine.  The spinal needle was inserted toward the target using a "trajectory" view along the fluoroscope beam.  Under AP and lateral visualization, the needle was advanced so it did not puncture dura and was located close the 6 O'Clock position of the pedical in AP tracterory. Biplanar projections were used to confirm position. Aspiration was confirmed to be negative for CSF and/or blood. A 1-2 ml. volume  of Isovue-250 was injected and flow of contrast was noted at each level. Radiographs were obtained for documentation purposes.   After attaining the desired flow of contrast documented above, a 0.5 to 1.0 ml test dose of 0.25% Marcaine was injected into each respective transforaminal space.  The patient was observed for 90 seconds post injection.  After no sensory deficits were reported, and normal lower extremity motor function was noted,   the above injectate was administered so that equal amounts of the injectate were placed at each foramen (level) into the transforaminal epidural space.   Additional Comments:  The patient tolerated the procedure well Dressing: Band-Aid    Post-procedure details: Patient was observed during the procedure. Post-procedure instructions were reviewed.  Patient left the clinic in stable condition. Lumbar Facet Joint Intra-Articular Injection(s) with Fluoroscopic Guidance  Patient: [name]      Date of Birth: [birth year] MRN: [id number] PCP: Patient, No Pcp Per      Visit Date: 02/21/2018   Universal Protocol:    Date/Time: 02/21/2018  Consent Given By: the patient  Position: PRONE   Additional Comments: Vital signs were monitored before and after the procedure. Patient was prepped and draped in the usual sterile fashion. The correct patient, procedure, and site was verified.   Injection Procedure Details:  Procedure Site One Meds Administered:  Meds ordered this encounter  Medications  . betamethasone acetate-betamethasone sodium phosphate (CELESTONE) injection 12 mg     Laterality: Right  Location/Site:  L5-S1  Needle size: 22 guage  Needle type: Spinal  Needle Placement: Articular  Findings:  -Comments: Excellent flow of contrast producing a partial arthrogram.  Procedure Details: The fluoroscope beam is vertically oriented in AP, and the inferior recess is visualized beneath the lower pole of the inferior apophyseal  process, which represents the target point for needle insertion. When direct visualization is difficult the target point is located at the medial projection of the vertebral pedicle. The region overlying each aforementioned target is locally anesthetized with a 1 to 2 ml. volume of 1% Lidocaine without Epinephrine.   The spinal needle was inserted into each of the above mentioned facet joints using biplanar fluoroscopic guidance. A 0.25 to 0.5 ml. volume of Isovue-250 was injected and a partial facet joint arthrogram was obtained. A single spot film was obtained of the resulting arthrogram.    One to 1.25 ml of the steroid/anesthetic solution was then injected into each of the facet joints noted above.   Additional Comments:  The patient tolerated the procedure well Dressing: Band-Aid    Post-procedure details: Patient was observed during the procedure. Post-procedure instructions were reviewed.  Patient left the clinic in stable condition.    Clinical History: MRI LUMBAR SPINE WITHOUT CONTRAST  TECHNIQUE: Multiplanar, multisequence MR imaging of the lumbar spine was performed. No intravenous contrast was administered.  COMPARISON: Lumbar radiographs 12/21/2017  FINDINGS: Segmentation: Normal segmentation  Alignment: Mild retrolisthesis T12-L1, L1-2, L2-3. Lumbar dextroscoliosis  Vertebrae: Negative for fracture or mass. Negative for pars defect.  Conus medullaris  and cauda equina: Conus extends to the L1-2 level. Conus and cauda equina appear normal.  Paraspinal and other soft tissues: Negative  Disc levels:  L1-2: Mild retrolisthesis. Mild disc degeneration and spurring. No significant spinal stenosis. Neural foramina patent.  L2-3: Mild retrolisthesis. Mild disc degeneration with disc bulging and diffuse endplate spurring. Mild facet hypertrophy bilaterally. Mild left foraminal narrowing. Spinal canal adequate in size.  L3-4: Mild disc and facet  degeneration without stenosis  L4-5: Mild disc degeneration and moderate facet degeneration. No significant spinal or foraminal stenosis.  L5-S1: Diffuse bulging of the disc. Small annular fissure centrally. Small extraforaminal disc protrusion on the right with impingement of the right L5 nerve root. Moderate facet hypertrophy bilaterally. Mild to moderate right foraminal stenosis.  IMPRESSION: Mild left foraminal narrowing L2-3 due to spurring  Small extraforaminal disc protrusion on the right L5-S1 with impingement of the right L5 nerve root. Mild-to-moderate right foraminal stenosis due to spurring.   Electronically Signed By: Franchot Gallo M.D. On: 12/31/2017 14:04   She reports that she has never smoked. She has never used smokeless tobacco. No results for input(s): HGBA1C, LABURIC in the last 8760 hours.  Objective:  VS:  HT:    WT:   BMI:     BP:(!) 152/71  HR:61bpm  TEMP:97.9 F (36.6 C)(Oral)  RESP:96 % Physical Exam  Musculoskeletal:  Patient ambulating with a walker.  She is slow to rise from a seated position.  She has good strength with dorsiflexion and plantarflexion.  She has no clonus.    Ortho Exam Imaging: No results found.  Past Medical/Family/Surgical/Social History: Medications & Allergies reviewed per EMR, new medications updated. Patient Active Problem List   Diagnosis Date Noted  . Chronic low back pain 12/21/2017  . Chronic antibiotic suppression 06/13/2017  . History of hip replacement 06/09/2017  . Ecchymosis 02/23/2017  . Closed intertrochanteric fracture with nonunion, left 01/26/2017  . Leukocytosis 01/26/2017  . Fall 01/26/2017  . Prosthetic hip infection ([place]) 08/22/2016  . Tendinopathy 08/22/2016  . Clinical depression 04/07/2016  . Arthritis of knee, degenerative 04/07/2016  . OP (osteoporosis) 04/07/2016  . 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 Chronic anticoagulation 08/06/2015  . Bipolar disorder ([place])  08/04/2015  . Hyponatremia 08/04/2015  . Constipation 08/04/2015  . Complication of anesthesia 07/30/2015  . Diabetes mellitus type 2, diet-controlled ([place])   . Skin cancer of face   . Paroxysmal atrial fibrillation ([place]) 09/01/2014  . Right carotid bruit 11/18/2013  . Postoperative anemia due to acute blood loss 03/03/2012  . Hypertension   . Hyperlipidemia   . Swelling of extremity   . Cataract   . Hypothyroidism   . Arthritis   . Insomnia    Past Medical History:  Diagnosis Date  . Anxiety attack    "take RX prn" (08/13/2013)  . Arthritis    "probably" (08/13/2013)  . Atrial fibrillation ([place])    new onset/pt report 08/13/2013  . Bronchitis 12/2011  . Complication of anesthesia   . Depression   . Diabetes mellitus type 2, diet-controlled ([place])    denies  . Femur fracture ([facility])    left proximal femur non-union  . GERD (gastroesophageal reflux disease)   . H/O hiatal hernia   . Hardware complicating wound infection ([place]) 08/22/2016  . History of blood transfusion    "w/both knee OR's" (08/13/2013)  . Hyperlipidemia   . Hyperlipidemia   . Hypertension   . Hypothyroidism    takes synthroid  . Insomnia   .  Intertrochanteric fracture of right hip ([place]) 07/30/2015  . Neuromuscular disorder ([place])    carpal tunnel in right hand  . Periprosthetic fracture around internal prosthetic right hip joint ([place]), nonunion basicervical femoral neck with broken hardware and subtrochanteric fracture 03/16/2016  . PONV (postoperative nausea and vomiting)   . Prosthetic hip infection ([place]) 08/22/2016  . Proteus infection 08/22/2016  . Serratia infection 11/14/2016  . Skin cancer of face 2000's   "one on each side of my face and one on my nose" (08/13/2013)  . Swelling of extremity    sees Dr. Gwenlyn Found, cardiologist 1 x year [phone]  . Tendon dysfunction 08/22/2016  . Wears hearing aid    HOH   Family History  Problem Relation Age of Onset  . Arrhythmia Mother   . Heart disease Mother   .  Arrhythmia Sister   . Stroke Father 29  . Heart disease Brother   . Heart disease Brother   . Cancer Brother        Colon cancer  . Arrhythmia Brother   . Heart disease Brother   . Heart disease Sister   . Heart disease Sister   . Stroke Sister   . Diabetes Sister   . Heart disease Child 107  . Heart disease Child 51   Past Surgical History:  Procedure Laterality Date  . 2D ECHOCARDIOGRAM  12/07/2004   EF 48%, moderate pulmonary hypertension  . BREAST BIOPSY Left ?1970's  . CARDIOVASCULAR STRESS TEST  12/07/11  . CARDIOVASCULAR STRESS TEST  12/07/2011   LEXISCAN, normal scan, no significant wall abnormalities noted  . CARPAL TUNNEL RELEASE Right ~ 2005  . CATARACT EXTRACTION, BILATERAL Bilateral 2000  . DILATION AND CURETTAGE OF UTERUS  1950's  . DOPPLER ECHOCARDIOGRAPHY  12/07/11  . EYE SURGERY    . FEMUR IM NAIL Right 08/01/2015   Procedure: INTRAMEDULLARY (IM) NAIL FEMORAL;  Surgeon: Marchia Bond, MD;  Location: [facility];  Service: Orthopedics;  Laterality: Right;  . HARDWARE REMOVAL Right 03/16/2016   Procedure: HARDWARE REMOVAL;  Surgeon: Marchia Bond, MD;  Location: [facility];  Service: Orthopedics;  Laterality: Right;  . INTRAMEDULLARY (IM) NAIL INTERTROCHANTERIC Left 01/26/2017   Procedure: INTRAMEDULLARY (IM) NAIL INTERTROCHANTRIC;  Surgeon: Leandrew Koyanagi, MD;  Location: [facility];  Service: Orthopedics;  Laterality: Left;  . JOINT REPLACEMENT     bilateral knee replacement  . SKIN CANCER EXCISION  2000's   "off my nose" (08/13/2013)  . TOTAL HIP ARTHROPLASTY Right 03/16/2016   Procedure: TOTAL HIP ARTHROPLASTY;  Surgeon: Marchia Bond, MD;  Location: [facility];  Service: Orthopedics;  Laterality: Right;  . TOTAL HIP ARTHROPLASTY Left 06/09/2017   Procedure: REMOVE LEFT HIP INTRAMEDULLARY NAIL AND CONVERT TO LEFT CEMENTED HEMI HIP ARTHROPLASTY;  Surgeon: Leandrew Koyanagi, MD;  Location: [facility];  Service: Orthopedics;  Laterality: Left;  . TOTAL KNEE ARTHROPLASTY Right 2005  . TOTAL KNEE  ARTHROPLASTY  03/02/2012   Procedure: TOTAL KNEE ARTHROPLASTY;  Surgeon: Yvette Rack., MD;  Location: [facility];  Service: Orthopedics;  Laterality: Left;   Social History   Occupational History  . Not on file  Tobacco Use  . Smoking status: Never Smoker  . Smokeless tobacco: Never Used  Substance and Sexual Activity  . Alcohol use: No  . Drug use: No  . Sexual activity: Never

## 2018-03-20 ENCOUNTER — Other Ambulatory Visit: Payer: Self-pay | Admitting: Infectious Disease

## 2018-04-02 DIAGNOSIS — M810 Age-related osteoporosis without current pathological fracture: Secondary | ICD-10-CM | POA: Diagnosis not present

## 2018-05-08 ENCOUNTER — Telehealth (INDEPENDENT_AMBULATORY_CARE_PROVIDER_SITE_OTHER): Payer: Self-pay | Admitting: Physical Medicine and Rehabilitation

## 2018-05-08 NOTE — Telephone Encounter (Signed)
actaully did a right L5 TF and Right L5-S1 facet block at same time. I think ok to repeat the same facet + TF esi and see how she does.

## 2018-05-08 NOTE — Telephone Encounter (Signed)
Scheduled for 6/13 with driver.

## 2018-05-09 DIAGNOSIS — R05 Cough: Secondary | ICD-10-CM | POA: Diagnosis not present

## 2018-05-09 DIAGNOSIS — J0100 Acute maxillary sinusitis, unspecified: Secondary | ICD-10-CM | POA: Diagnosis not present

## 2018-05-09 DIAGNOSIS — J040 Acute laryngitis: Secondary | ICD-10-CM | POA: Diagnosis not present

## 2018-05-09 DIAGNOSIS — I10 Essential (primary) hypertension: Secondary | ICD-10-CM | POA: Diagnosis not present

## 2018-05-24 ENCOUNTER — Encounter (INDEPENDENT_AMBULATORY_CARE_PROVIDER_SITE_OTHER): Payer: Self-pay | Admitting: Physical Medicine and Rehabilitation

## 2018-05-24 ENCOUNTER — Ambulatory Visit (INDEPENDENT_AMBULATORY_CARE_PROVIDER_SITE_OTHER): Payer: Medicare Other

## 2018-05-24 ENCOUNTER — Ambulatory Visit (INDEPENDENT_AMBULATORY_CARE_PROVIDER_SITE_OTHER): Payer: Medicare Other | Admitting: Physical Medicine and Rehabilitation

## 2018-05-24 VITALS — BP 169/75 | HR 60

## 2018-05-24 DIAGNOSIS — M47816 Spondylosis without myelopathy or radiculopathy, lumbar region: Secondary | ICD-10-CM | POA: Diagnosis not present

## 2018-05-24 DIAGNOSIS — M5416 Radiculopathy, lumbar region: Secondary | ICD-10-CM | POA: Diagnosis not present

## 2018-05-24 MED ORDER — BETAMETHASONE SOD PHOS & ACET 6 (3-3) MG/ML IJ SUSP
12.0000 mg | Freq: Once | INTRAMUSCULAR | Status: AC
  Administered 2018-05-24: 12 mg

## 2018-05-24 NOTE — Progress Notes (Signed)
 .  Numeric Pain Rating Scale and Functional Assessment Average Pain 6   In the last MONTH (on 0-10 scale) has pain interfered with the following?  1. General activity like being  able to carry out your everyday physical activities such as walking, climbing stairs, carrying groceries, or moving a chair?  Rating(3)   +Driver, -BT, -Dye Allergies.  

## 2018-05-24 NOTE — Patient Instructions (Signed)

## 2018-05-25 NOTE — Procedures (Signed)
Lumbar Facet Joint Intra-Articular Injection(s) with Fluoroscopic Guidance  Patient: Leah Holland      Date of Birth: 17-Feb-1922 MRN: [id number] PCP: Patient, No Pcp Per      Visit Date: 05/24/2018   Universal Protocol:    Date/Time: 05/24/2018  Consent Given By: the patient  Position: PRONE   Additional Comments: Vital signs were monitored before and after the procedure. Patient was prepped and draped in the usual sterile fashion. The correct patient, procedure, and site was verified.   Injection Procedure Details:  Procedure Site One Meds Administered:  Meds ordered this encounter  Medications  . betamethasone acetate-betamethasone sodium phosphate (CELESTONE) injection 12 mg    40 mg of Depo-Medrol was utilized in the facet joint.  Laterality: Right  Location/Site:  L5-S1  Needle size: 22 guage  Needle type: Spinal  Needle Placement: Articular  Findings:  -Comments: Excellent flow of contrast producing a partial arthrogram.  Procedure Details: The fluoroscope beam is vertically oriented in AP, and the inferior recess is visualized beneath the lower pole of the inferior apophyseal process, which represents the target point for needle insertion. When direct visualization is difficult the target point is located at the medial projection of the vertebral pedicle. The region overlying each aforementioned target is locally anesthetized with a 1 to 2 ml. volume of 1% Lidocaine without Epinephrine.   The spinal needle was inserted into each of the above mentioned facet joints using biplanar fluoroscopic guidance. A 0.25 to 0.5 ml. volume of Isovue-250 was injected and a partial facet joint arthrogram was obtained. A single spot film was obtained of the resulting arthrogram.    One to 1.25 ml of the steroid/anesthetic solution was then injected into each of the facet joints noted above.   Additional Comments:  The patient tolerated the procedure well Dressing:  Band-Aid    Post-procedure details: Patient was observed during the procedure. Post-procedure instructions were reviewed.  Patient left the clinic in stable condition.

## 2018-05-25 NOTE — Procedures (Signed)
Lumbosacral Transforaminal Epidural Steroid Injection - Sub-Pedicular Approach with Fluoroscopic Guidance  Patient: RAYDEN SCHEPER      Date of Birth: 06-Oct-1922 MRN: [id number] PCP: Patient, No Pcp Per      Visit Date: 05/24/2018   Universal Protocol:    Date/Time: 05/24/2018  Consent Given By: the patient  Position: PRONE  Additional Comments: Vital signs were monitored before and after the procedure. Patient was prepped and draped in the usual sterile fashion. The correct patient, procedure, and site was verified.   Injection Procedure Details:  Procedure Site One Meds Administered:  Meds ordered this encounter  Medications  . betamethasone acetate-betamethasone sodium phosphate (CELESTONE) injection 12 mg    Laterality: Right  Location/Site:  L5-S1  Needle size: 22 G  Needle type: Spinal  Needle Placement: Transforaminal  Findings:    -Comments: Excellent flow of contrast along the nerve and into the epidural space.  Patient had a little bit more radicular type pain during the delivery of the medication than she has had in the past but overall did fairly well and the injection was very well placed.  Consideration probably should be given to infra neural approach.  Procedure Details: After squaring off the end-plates to get a true AP view, the C-arm was positioned so that an oblique view of the foramen as noted above was visualized. The target area is just inferior to the "nose of the scotty dog" or sub pedicular. The soft tissues overlying this structure were infiltrated with 2-3 ml. of 1% Lidocaine without Epinephrine.  The spinal needle was inserted toward the target using a "trajectory" view along the fluoroscope beam.  Under AP and lateral visualization, the needle was advanced so it did not puncture dura and was located close the 6 O'Clock position of the pedical in AP tracterory. Biplanar projections were used to confirm position. Aspiration was confirmed to  be negative for CSF and/or blood. A 1-2 ml. volume of Isovue-250 was injected and flow of contrast was noted at each level. Radiographs were obtained for documentation purposes.   After attaining the desired flow of contrast documented above, a 0.5 to 1.0 ml test dose of 0.25% Marcaine was injected into each respective transforaminal space.  The patient was observed for 90 seconds post injection.  After no sensory deficits were reported, and normal lower extremity motor function was noted,   the above injectate was administered so that equal amounts of the injectate were placed at each foramen (level) into the transforaminal epidural space.   Additional Comments:  No complications occurred Dressing: Band-Aid    Post-procedure details: Patient was observed during the procedure. Post-procedure instructions were reviewed.  Patient left the clinic in stable condition.

## 2018-05-25 NOTE — Progress Notes (Signed)
Leah Holland - 85 y.o. female MRN [id number]  Date of birth: 20-Oct-1922  Office Visit Note: Visit Date: 05/24/2018 PCP: Patient, No Pcp Per Referred by: No ref. provider found  Subjective: No chief complaint on file.  HPI: Leah Holland is a very pleasant 84 year old female with chronic history of L5 radiculitis radiculopathy.  She has small extraforaminal disc on the right at L5-S1 with moderate facet arthropathy and some impingement of the L5 nerve root.  I last saw her in March and completed a combination of a right L5 transforaminal epidural steroid injection L5-S1 facet joint block.  This gave her very good relief of symptoms up until just about a week or so ago.  She has had no new trauma or falls.  Overall she is much better than when we first saw her when she had severe nerve root pain.  We completed an interlaminar injection which was not very successful but then that followed with 2 transforaminal approaches which gave her quite a bit of relief.  We are going to repeat the L5 transforaminal injection today and facet joint block.  Hopefully these will start to give her longer periods of relief and will monitor her.  She will call us back if the symptoms recur.  She has had no focal weakness.  Otherwise she is doing well.   ROS Otherwise per HPI.  Assessment & Plan: Visit Diagnoses:  1. Lumbar radiculopathy   2. Spondylosis without myelopathy or radiculopathy, lumbar region     Plan: No additional findings.   Meds & Orders:  Meds ordered this encounter  Medications  . betamethasone acetate-betamethasone sodium phosphate (CELESTONE) injection 12 mg    Orders Placed This Encounter  Procedures  . Facet Injection  . XR C-ARM NO REPORT  . Epidural Steroid injection    Follow-up: Return if symptoms worsen or fail to improve.   Procedures: No procedures performed  Lumbosacral Transforaminal Epidural Steroid Injection - Sub-Pedicular Approach with Fluoroscopic  Guidance  Patient: Leah Holland      Date of Birth: 1922/01/23 MRN: [id number] PCP: Patient, No Pcp Per      Visit Date: 05/24/2018   Universal Protocol:    Date/Time: 05/24/2018  Consent Given By: the patient  Position: PRONE  Additional Comments: Vital signs were monitored before and after the procedure. Patient was prepped and draped in the usual sterile fashion. The correct patient, procedure, and site was verified.   Injection Procedure Details:  Procedure Site One Meds Administered:  Meds ordered this encounter  Medications  . betamethasone acetate-betamethasone sodium phosphate (CELESTONE) injection 12 mg    Laterality: Right  Location/Site:  L5-S1  Needle size: 22 G  Needle type: Spinal  Needle Placement: Transforaminal  Findings:    -Comments: Excellent flow of contrast along the nerve and into the epidural space.  Patient had a little bit more radicular type pain during the delivery of the medication than she has had in the past but overall did fairly well and the injection was very well placed.  Consideration probably should be given to infra neural approach.  Procedure Details: After squaring off the end-plates to get a true AP view, the C-arm was positioned so that an oblique view of the foramen as noted above was visualized. The target area is just inferior to the "nose of the scotty dog" or sub pedicular. The soft tissues overlying this structure were infiltrated with 2-3 ml. of 1% Lidocaine without Epinephrine.  The spinal needle was inserted  toward the target using a "trajectory" view along the fluoroscope beam.  Under AP and lateral visualization, the needle was advanced so it did not puncture dura and was located close the 6 O'Clock position of the pedical in AP tracterory. Biplanar projections were used to confirm position. Aspiration was confirmed to be negative for CSF and/or blood. A 1-2 ml. volume of Isovue-250 was injected and flow of contrast  was noted at each level. Radiographs were obtained for documentation purposes.   After attaining the desired flow of contrast documented above, a 0.5 to 1.0 ml test dose of 0.25% Marcaine was injected into each respective transforaminal space.  The patient was observed for 90 seconds post injection.  After no sensory deficits were reported, and normal lower extremity motor function was noted,   the above injectate was administered so that equal amounts of the injectate were placed at each foramen (level) into the transforaminal epidural space.   Additional Comments:  No complications occurred Dressing: Band-Aid    Post-procedure details: Patient was observed during the procedure. Post-procedure instructions were reviewed.  Patient left the clinic in stable condition.   Lumbar Facet Joint Intra-Articular Injection(s) with Fluoroscopic Guidance  Patient: Leah Holland      Date of Birth: 1922-10-23 MRN: [id number] PCP: Patient, No Pcp Per      Visit Date: 05/24/2018   Universal Protocol:    Date/Time: 05/24/2018  Consent Given By: the patient  Position: PRONE   Additional Comments: Vital signs were monitored before and after the procedure. Patient was prepped and draped in the usual sterile fashion. The correct patient, procedure, and site was verified.   Injection Procedure Details:  Procedure Site One Meds Administered:  Meds ordered this encounter  Medications  . betamethasone acetate-betamethasone sodium phosphate (CELESTONE) injection 12 mg    40 mg of Depo-Medrol was utilized in the facet joint.  Laterality: Right  Location/Site:  L5-S1  Needle size: 22 guage  Needle type: Spinal  Needle Placement: Articular  Findings:  -Comments: Excellent flow of contrast producing a partial arthrogram.  Procedure Details: The fluoroscope beam is vertically oriented in AP, and the inferior recess is visualized beneath the lower pole of the inferior apophyseal  process, which represents the target point for needle insertion. When direct visualization is difficult the target point is located at the medial projection of the vertebral pedicle. The region overlying each aforementioned target is locally anesthetized with a 1 to 2 ml. volume of 1% Lidocaine without Epinephrine.   The spinal needle was inserted into each of the above mentioned facet joints using biplanar fluoroscopic guidance. A 0.25 to 0.5 ml. volume of Isovue-250 was injected and a partial facet joint arthrogram was obtained. A single spot film was obtained of the resulting arthrogram.    One to 1.25 ml of the steroid/anesthetic solution was then injected into each of the facet joints noted above.   Additional Comments:  The patient tolerated the procedure well Dressing: Band-Aid    Post-procedure details: Patient was observed during the procedure. Post-procedure instructions were reviewed.  Patient left the clinic in stable condition.    Clinical History: MRI LUMBAR SPINE WITHOUT CONTRAST  TECHNIQUE: Multiplanar, multisequence MR imaging of the lumbar spine was performed. No intravenous contrast was administered.  COMPARISON: Lumbar radiographs 12/21/2017  FINDINGS: Segmentation: Normal segmentation  Alignment: Mild retrolisthesis T12-L1, L1-2, L2-3. Lumbar dextroscoliosis  Vertebrae: Negative for fracture or mass. Negative for pars defect.  Conus medullaris and cauda equina: Conus extends to the  L1-2 level. Conus and cauda equina appear normal.  Paraspinal and other soft tissues: Negative  Disc levels:  L1-2: Mild retrolisthesis. Mild disc degeneration and spurring. No significant spinal stenosis. Neural foramina patent.  L2-3: Mild retrolisthesis. Mild disc degeneration with disc bulging and diffuse endplate spurring. Mild facet hypertrophy bilaterally. Mild left foraminal narrowing. Spinal canal adequate in size.  L3-4: Mild disc and facet  degeneration without stenosis  L4-5: Mild disc degeneration and moderate facet degeneration. No significant spinal or foraminal stenosis.  L5-S1: Diffuse bulging of the disc. Small annular fissure centrally. Small extraforaminal disc protrusion on the right with impingement of the right L5 nerve root. Moderate facet hypertrophy bilaterally. Mild to moderate right foraminal stenosis.  IMPRESSION: Mild left foraminal narrowing L2-3 due to spurring  Small extraforaminal disc protrusion on the right L5-S1 with impingement of the right L5 nerve root. Mild-to-moderate right foraminal stenosis due to spurring.   Electronically Signed By: Franchot Gallo M.D. On: 12/31/2017 14:04   She reports that she has never smoked. She has never used smokeless tobacco. No results for input(s): HGBA1C, LABURIC in the last 8760 hours.  Objective:  VS:  HT:    WT:   BMI:     BP:(!) 169/75  HR:60bpm  TEMP: ( )  RESP:  Physical Exam  Ortho Exam Imaging: Xr C-arm No Report  Result Date: 05/24/2018 Please see Notes or Procedures tab for imaging impression.   Past Medical/Family/Surgical/Social History: Medications & Allergies reviewed per EMR, new medications updated. Patient Active Problem List   Diagnosis Date Noted  . Chronic low back pain 12/21/2017  . Chronic antibiotic suppression 06/13/2017  . History of hip replacement 06/09/2017  . Ecchymosis 02/23/2017  . Closed intertrochanteric fracture with nonunion, left 01/26/2017  . Leukocytosis 01/26/2017  . Fall 01/26/2017  . Prosthetic hip infection ([place]) 08/22/2016  . Tendinopathy 08/22/2016  . Clinical depression 04/07/2016  . Arthritis of knee, degenerative 04/07/2016  . OP (osteoporosis) 04/07/2016  . 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 Chronic anticoagulation 08/06/2015  . Bipolar disorder ([place]) 08/04/2015  . Hyponatremia 08/04/2015  . Constipation 08/04/2015  . Complication of anesthesia 07/30/2015  . Diabetes  mellitus type 2, diet-controlled ([place])   . Skin cancer of face   . Paroxysmal atrial fibrillation ([place]) 09/01/2014  . Right carotid bruit 11/18/2013  . Postoperative anemia due to acute blood loss 03/03/2012  . Hypertension   . Hyperlipidemia   . Swelling of extremity   . Cataract   . Hypothyroidism   . Arthritis   . Insomnia    Past Medical History:  Diagnosis Date  . Anxiety attack    "take RX prn" (08/13/2013)  . Arthritis    "probably" (08/13/2013)  . Atrial fibrillation ([place])    new onset/pt report 08/13/2013  . Bronchitis 12/2011  . Complication of anesthesia   . Depression   . Diabetes mellitus type 2, diet-controlled ([place])    denies  . Femur fracture ([facility])    left proximal femur non-union  . GERD (gastroesophageal reflux disease)   . H/O hiatal hernia   . Hardware complicating wound infection ([place]) 08/22/2016  . History of blood transfusion    "w/both knee OR's" (08/13/2013)  . Hyperlipidemia   . Hyperlipidemia   . Hypertension   . Hypothyroidism    takes synthroid  . Insomnia   . Intertrochanteric fracture of right hip ([place]) 07/30/2015  . Neuromuscular disorder ([place])    carpal tunnel in right hand  . Periprosthetic fracture around  internal prosthetic right hip joint ([place]), nonunion basicervical femoral neck with broken hardware and subtrochanteric fracture 03/16/2016  . PONV (postoperative nausea and vomiting)   . Prosthetic hip infection ([place]) 08/22/2016  . Proteus infection 08/22/2016  . Serratia infection 11/14/2016  . Skin cancer of face 2000's   "one on each side of my face and one on my nose" (08/13/2013)  . Swelling of extremity    sees Dr. Gwenlyn Found, cardiologist 1 x year [phone]  . Tendon dysfunction 08/22/2016  . Wears hearing aid    HOH   Family History  Problem Relation Age of Onset  . Arrhythmia Mother   . Heart disease Mother   . Arrhythmia Sister   . Stroke Father 70  . Heart disease Brother   . Heart disease Brother   . Cancer Brother         Colon cancer  . Arrhythmia Brother   . Heart disease Brother   . Heart disease Sister   . Heart disease Sister   . Stroke Sister   . Diabetes Sister   . Heart disease Child 55  . Heart disease Child 57   Past Surgical History:  Procedure Laterality Date  . 2D ECHOCARDIOGRAM  12/07/2004   EF 48%, moderate pulmonary hypertension  . BREAST BIOPSY Left ?1970's  . CARDIOVASCULAR STRESS TEST  12/07/11  . CARDIOVASCULAR STRESS TEST  12/07/2011   LEXISCAN, normal scan, no significant wall abnormalities noted  . CARPAL TUNNEL RELEASE Right ~ 2005  . CATARACT EXTRACTION, BILATERAL Bilateral 2000  . DILATION AND CURETTAGE OF UTERUS  1950's  . DOPPLER ECHOCARDIOGRAPHY  12/07/11  . EYE SURGERY    . FEMUR IM NAIL Right 08/01/2015   Procedure: INTRAMEDULLARY (IM) NAIL FEMORAL;  Surgeon: Marchia Bond, MD;  Location: [facility];  Service: Orthopedics;  Laterality: Right;  . HARDWARE REMOVAL Right 03/16/2016   Procedure: HARDWARE REMOVAL;  Surgeon: Marchia Bond, MD;  Location: [facility];  Service: Orthopedics;  Laterality: Right;  . INTRAMEDULLARY (IM) NAIL INTERTROCHANTERIC Left 01/26/2017   Procedure: INTRAMEDULLARY (IM) NAIL INTERTROCHANTRIC;  Surgeon: Leandrew Koyanagi, MD;  Location: [facility];  Service: Orthopedics;  Laterality: Left;  . JOINT REPLACEMENT     bilateral knee replacement  . SKIN CANCER EXCISION  2000's   "off my nose" (08/13/2013)  . TOTAL HIP ARTHROPLASTY Right 03/16/2016   Procedure: TOTAL HIP ARTHROPLASTY;  Surgeon: Marchia Bond, MD;  Location: [facility];  Service: Orthopedics;  Laterality: Right;  . TOTAL HIP ARTHROPLASTY Left 06/09/2017   Procedure: REMOVE LEFT HIP INTRAMEDULLARY NAIL AND CONVERT TO LEFT CEMENTED HEMI HIP ARTHROPLASTY;  Surgeon: Leandrew Koyanagi, MD;  Location: [facility];  Service: Orthopedics;  Laterality: Left;  . TOTAL KNEE ARTHROPLASTY Right 2005  . TOTAL KNEE ARTHROPLASTY  03/02/2012   Procedure: TOTAL KNEE ARTHROPLASTY;  Surgeon: Yvette Rack., MD;  Location: [facility];  Service:  Orthopedics;  Laterality: Left;   Social History   Occupational History  . Not on file  Tobacco Use  . Smoking status: Never Smoker  . Smokeless tobacco: Never Used  Substance and Sexual Activity  . Alcohol use: No  . Drug use: No  . Sexual activity: Never

## 2018-06-28 DIAGNOSIS — H04123 Dry eye syndrome of bilateral lacrimal glands: Secondary | ICD-10-CM | POA: Diagnosis not present

## 2018-06-28 DIAGNOSIS — H35372 Puckering of macula, left eye: Secondary | ICD-10-CM | POA: Diagnosis not present

## 2018-06-28 DIAGNOSIS — H353132 Nonexudative age-related macular degeneration, bilateral, intermediate dry stage: Secondary | ICD-10-CM | POA: Diagnosis not present

## 2018-06-28 DIAGNOSIS — H524 Presbyopia: Secondary | ICD-10-CM | POA: Diagnosis not present

## 2018-06-29 DIAGNOSIS — R143 Flatulence: Secondary | ICD-10-CM | POA: Diagnosis not present

## 2018-06-29 DIAGNOSIS — E039 Hypothyroidism, unspecified: Secondary | ICD-10-CM | POA: Diagnosis not present

## 2018-06-29 DIAGNOSIS — I10 Essential (primary) hypertension: Secondary | ICD-10-CM | POA: Diagnosis not present

## 2018-06-29 DIAGNOSIS — I4891 Unspecified atrial fibrillation: Secondary | ICD-10-CM | POA: Diagnosis not present

## 2018-06-29 DIAGNOSIS — M5431 Sciatica, right side: Secondary | ICD-10-CM | POA: Diagnosis not present

## 2018-06-29 DIAGNOSIS — E785 Hyperlipidemia, unspecified: Secondary | ICD-10-CM | POA: Diagnosis not present

## 2018-07-10 DIAGNOSIS — R5383 Other fatigue: Secondary | ICD-10-CM | POA: Diagnosis not present

## 2018-07-10 DIAGNOSIS — M810 Age-related osteoporosis without current pathological fracture: Secondary | ICD-10-CM | POA: Diagnosis not present

## 2018-07-10 DIAGNOSIS — E559 Vitamin D deficiency, unspecified: Secondary | ICD-10-CM | POA: Diagnosis not present

## 2018-07-11 DIAGNOSIS — E559 Vitamin D deficiency, unspecified: Secondary | ICD-10-CM | POA: Diagnosis not present

## 2018-07-11 DIAGNOSIS — M810 Age-related osteoporosis without current pathological fracture: Secondary | ICD-10-CM | POA: Diagnosis not present

## 2018-08-06 ENCOUNTER — Telehealth: Payer: Self-pay | Admitting: Cardiovascular Disease

## 2018-08-06 ENCOUNTER — Telehealth (INDEPENDENT_AMBULATORY_CARE_PROVIDER_SITE_OTHER): Payer: Self-pay | Admitting: Physical Medicine and Rehabilitation

## 2018-08-06 NOTE — Telephone Encounter (Signed)
Returned call to patient.She stated she has been having more episodes of Afib.Stated she takes pm dose of metoprolol alittle earlier and this is working well.She wanted to know her next appointment with Dr.Berry.Advised she is due to see Dr.Berry in 02/2019.Advised if she needs to be seen sooner to call.

## 2018-08-06 NOTE — Telephone Encounter (Signed)
.  Patient c/o Palpitations:  High priority if patient c/o lightheadedness, shortness of breath, or chest pain  1) How long have you had palpitations/irregular HR/ Afib? Are you having the symptoms now? Last 2 or 3wks--not at the present  2) Are you currently experiencing lightheadedness, SOB or CP? Sometimes a little  Short of breath  3) Do you have a history of afib (atrial fibrillation) or irregular heart rhythm? yes  4) Have you checked your BP or HR? (document readings if available): Blood pressure have been good  5) Are you experiencing any other symptoms? no

## 2018-08-06 NOTE — Telephone Encounter (Signed)
Scheduled for an OV 08/16/18 at 0945.

## 2018-08-06 NOTE — Telephone Encounter (Signed)
Could OV

## 2018-08-16 ENCOUNTER — Ambulatory Visit (INDEPENDENT_AMBULATORY_CARE_PROVIDER_SITE_OTHER): Payer: Medicare Other | Admitting: Physical Medicine and Rehabilitation

## 2018-08-16 DIAGNOSIS — E039 Hypothyroidism, unspecified: Secondary | ICD-10-CM | POA: Diagnosis not present

## 2018-08-17 ENCOUNTER — Encounter (INDEPENDENT_AMBULATORY_CARE_PROVIDER_SITE_OTHER): Payer: Self-pay | Admitting: Physical Medicine and Rehabilitation

## 2018-08-17 ENCOUNTER — Ambulatory Visit (INDEPENDENT_AMBULATORY_CARE_PROVIDER_SITE_OTHER): Payer: Medicare Other | Admitting: Physical Medicine and Rehabilitation

## 2018-08-17 VITALS — BP 161/95 | HR 82

## 2018-08-17 DIAGNOSIS — M5416 Radiculopathy, lumbar region: Secondary | ICD-10-CM

## 2018-08-17 DIAGNOSIS — M5116 Intervertebral disc disorders with radiculopathy, lumbar region: Secondary | ICD-10-CM | POA: Diagnosis not present

## 2018-08-17 DIAGNOSIS — R202 Paresthesia of skin: Secondary | ICD-10-CM | POA: Diagnosis not present

## 2018-08-17 NOTE — Progress Notes (Signed)
  Numeric Pain Rating Scale and Functional Assessment Average Pain 6 Pain Right Now 6 My pain is intermittent, sharp, tingling and aching Pain is worse with: Lying down Pain improves with: medication   In the last MONTH (on 0-10 scale) has pain interfered with the following?  1. General activity like being  able to carry out your everyday physical activities such as walking, climbing stairs, carrying groceries, or moving a chair?  Rating(5)  2. Relation with others like being able to carry out your usual social activities and roles such as  activities at home, at work and in your community. Rating(5)  3. Enjoyment of life such that you have  been bothered by emotional problems such as feeling anxious, depressed or irritable?  Rating(1)

## 2018-08-20 MED ORDER — GABAPENTIN 300 MG PO CAPS: ORAL_CAPSULE | ORAL | 0 refills | Status: DC

## 2018-08-22 DIAGNOSIS — R278 Other lack of coordination: Secondary | ICD-10-CM | POA: Diagnosis not present

## 2018-08-22 DIAGNOSIS — R2681 Unsteadiness on feet: Secondary | ICD-10-CM | POA: Diagnosis not present

## 2018-08-22 DIAGNOSIS — R29898 Other symptoms and signs involving the musculoskeletal system: Secondary | ICD-10-CM | POA: Diagnosis not present

## 2018-08-22 DIAGNOSIS — Z9181 History of falling: Secondary | ICD-10-CM | POA: Diagnosis not present

## 2018-08-22 DIAGNOSIS — M6281 Muscle weakness (generalized): Secondary | ICD-10-CM | POA: Diagnosis not present

## 2018-08-22 DIAGNOSIS — M25551 Pain in right hip: Secondary | ICD-10-CM | POA: Diagnosis not present

## 2018-08-22 DIAGNOSIS — M79604 Pain in right leg: Secondary | ICD-10-CM | POA: Diagnosis not present

## 2018-08-22 DIAGNOSIS — I4891 Unspecified atrial fibrillation: Secondary | ICD-10-CM | POA: Diagnosis not present

## 2018-08-22 DIAGNOSIS — E1300 Other specified diabetes mellitus with hyperosmolarity without nonketotic hyperglycemic-hyperosmolar coma (NKHHC): Secondary | ICD-10-CM | POA: Diagnosis not present

## 2018-08-22 DIAGNOSIS — F5101 Primary insomnia: Secondary | ICD-10-CM | POA: Diagnosis not present

## 2018-08-22 DIAGNOSIS — I10 Essential (primary) hypertension: Secondary | ICD-10-CM | POA: Diagnosis not present

## 2018-08-22 DIAGNOSIS — Z4789 Encounter for other orthopedic aftercare: Secondary | ICD-10-CM | POA: Diagnosis not present

## 2018-08-22 DIAGNOSIS — K219 Gastro-esophageal reflux disease without esophagitis: Secondary | ICD-10-CM | POA: Diagnosis not present

## 2018-08-22 DIAGNOSIS — E039 Hypothyroidism, unspecified: Secondary | ICD-10-CM | POA: Diagnosis not present

## 2018-08-22 DIAGNOSIS — M1990 Unspecified osteoarthritis, unspecified site: Secondary | ICD-10-CM | POA: Diagnosis not present

## 2018-08-22 DIAGNOSIS — M21751 Unequal limb length (acquired), right femur: Secondary | ICD-10-CM | POA: Diagnosis not present

## 2018-08-22 DIAGNOSIS — M25571 Pain in right ankle and joints of right foot: Secondary | ICD-10-CM | POA: Diagnosis not present

## 2018-08-22 DIAGNOSIS — K5909 Other constipation: Secondary | ICD-10-CM | POA: Diagnosis not present

## 2018-08-22 DIAGNOSIS — E785 Hyperlipidemia, unspecified: Secondary | ICD-10-CM | POA: Diagnosis not present

## 2018-08-22 DIAGNOSIS — F419 Anxiety disorder, unspecified: Secondary | ICD-10-CM | POA: Diagnosis not present

## 2018-08-22 DIAGNOSIS — K440 Diaphragmatic hernia with obstruction, without gangrene: Secondary | ICD-10-CM | POA: Diagnosis not present

## 2018-08-24 DIAGNOSIS — M6281 Muscle weakness (generalized): Secondary | ICD-10-CM | POA: Diagnosis not present

## 2018-08-24 DIAGNOSIS — M79604 Pain in right leg: Secondary | ICD-10-CM | POA: Diagnosis not present

## 2018-08-24 DIAGNOSIS — I4891 Unspecified atrial fibrillation: Secondary | ICD-10-CM | POA: Diagnosis not present

## 2018-08-24 DIAGNOSIS — Z9181 History of falling: Secondary | ICD-10-CM | POA: Diagnosis not present

## 2018-08-24 DIAGNOSIS — R278 Other lack of coordination: Secondary | ICD-10-CM | POA: Diagnosis not present

## 2018-08-24 DIAGNOSIS — M25571 Pain in right ankle and joints of right foot: Secondary | ICD-10-CM | POA: Diagnosis not present

## 2018-08-27 DIAGNOSIS — Z9181 History of falling: Secondary | ICD-10-CM | POA: Diagnosis not present

## 2018-08-27 DIAGNOSIS — M6281 Muscle weakness (generalized): Secondary | ICD-10-CM | POA: Diagnosis not present

## 2018-08-27 DIAGNOSIS — M79604 Pain in right leg: Secondary | ICD-10-CM | POA: Diagnosis not present

## 2018-08-27 DIAGNOSIS — R278 Other lack of coordination: Secondary | ICD-10-CM | POA: Diagnosis not present

## 2018-08-27 DIAGNOSIS — I4891 Unspecified atrial fibrillation: Secondary | ICD-10-CM | POA: Diagnosis not present

## 2018-08-27 DIAGNOSIS — M25571 Pain in right ankle and joints of right foot: Secondary | ICD-10-CM | POA: Diagnosis not present

## 2018-08-29 DIAGNOSIS — M79604 Pain in right leg: Secondary | ICD-10-CM | POA: Diagnosis not present

## 2018-08-29 DIAGNOSIS — R278 Other lack of coordination: Secondary | ICD-10-CM | POA: Diagnosis not present

## 2018-08-29 DIAGNOSIS — Z9181 History of falling: Secondary | ICD-10-CM | POA: Diagnosis not present

## 2018-08-29 DIAGNOSIS — M6281 Muscle weakness (generalized): Secondary | ICD-10-CM | POA: Diagnosis not present

## 2018-08-29 DIAGNOSIS — M25571 Pain in right ankle and joints of right foot: Secondary | ICD-10-CM | POA: Diagnosis not present

## 2018-08-29 DIAGNOSIS — I4891 Unspecified atrial fibrillation: Secondary | ICD-10-CM | POA: Diagnosis not present

## 2018-08-30 DIAGNOSIS — I4891 Unspecified atrial fibrillation: Secondary | ICD-10-CM | POA: Diagnosis not present

## 2018-08-30 DIAGNOSIS — Z9181 History of falling: Secondary | ICD-10-CM | POA: Diagnosis not present

## 2018-08-30 DIAGNOSIS — M79604 Pain in right leg: Secondary | ICD-10-CM | POA: Diagnosis not present

## 2018-08-30 DIAGNOSIS — M25571 Pain in right ankle and joints of right foot: Secondary | ICD-10-CM | POA: Diagnosis not present

## 2018-08-30 DIAGNOSIS — R278 Other lack of coordination: Secondary | ICD-10-CM | POA: Diagnosis not present

## 2018-08-30 DIAGNOSIS — M6281 Muscle weakness (generalized): Secondary | ICD-10-CM | POA: Diagnosis not present

## 2018-09-03 DIAGNOSIS — I4891 Unspecified atrial fibrillation: Secondary | ICD-10-CM | POA: Diagnosis not present

## 2018-09-03 DIAGNOSIS — M25571 Pain in right ankle and joints of right foot: Secondary | ICD-10-CM | POA: Diagnosis not present

## 2018-09-03 DIAGNOSIS — R278 Other lack of coordination: Secondary | ICD-10-CM | POA: Diagnosis not present

## 2018-09-03 DIAGNOSIS — M79604 Pain in right leg: Secondary | ICD-10-CM | POA: Diagnosis not present

## 2018-09-03 DIAGNOSIS — M6281 Muscle weakness (generalized): Secondary | ICD-10-CM | POA: Diagnosis not present

## 2018-09-03 DIAGNOSIS — Z9181 History of falling: Secondary | ICD-10-CM | POA: Diagnosis not present

## 2018-09-05 ENCOUNTER — Encounter (INDEPENDENT_AMBULATORY_CARE_PROVIDER_SITE_OTHER): Payer: Self-pay | Admitting: Physical Medicine and Rehabilitation

## 2018-09-05 DIAGNOSIS — Z9181 History of falling: Secondary | ICD-10-CM | POA: Diagnosis not present

## 2018-09-05 DIAGNOSIS — R278 Other lack of coordination: Secondary | ICD-10-CM | POA: Diagnosis not present

## 2018-09-05 DIAGNOSIS — I4891 Unspecified atrial fibrillation: Secondary | ICD-10-CM | POA: Diagnosis not present

## 2018-09-05 DIAGNOSIS — M79604 Pain in right leg: Secondary | ICD-10-CM | POA: Diagnosis not present

## 2018-09-05 DIAGNOSIS — M6281 Muscle weakness (generalized): Secondary | ICD-10-CM | POA: Diagnosis not present

## 2018-09-05 DIAGNOSIS — M25571 Pain in right ankle and joints of right foot: Secondary | ICD-10-CM | POA: Diagnosis not present

## 2018-09-05 NOTE — Progress Notes (Signed)
Leah Holland - 84 y.o. female MRN [id number]  Date of birth: 1922-11-18  Office Visit Note: Visit Date: 08/17/2018 PCP: Patient, No Pcp Per Referred by: No ref. provider found  Subjective: Chief Complaint  Patient presents with  . Lower Back - Pain   HPI: Leah Holland is a very pleasant 84 year old female that we have seen recently completed mainly L5 transforaminal epidural injection combined with facet joint block which is actually provided her quite a bit of relief of her back and left hip and leg pain.  She still has pain and paresthesia essentially in the lateral calf from the knee down.  She is accompanied once again by her son who provides some of the history.  She reports the last injection which was a few months ago has helped even more than the others and seem to be lasting longer.  Most of her pain right now is in the foot and ankle.  She talks about some swelling and tingling in the right foot and ankle.  It is problematic and that it wakes her up at night.  No pain on the left side.  Worse with standing worse at night.  Is intermittent but sharp.  Again worse with laying down at night.  Medicines do tend to help.  She does take Cymbalta.  She has not had or tolerated in the past gabapentin.  She has had no new falls or trauma.  Her case is somewhat complicated by history of depression and anxiety.  She does take temazepam.  She has had no focal weakness.  History of complicated periprosthetic hip fracture.   Review of Systems  Constitutional: Positive for malaise/fatigue. Negative for chills, fever and weight loss.  HENT: Negative for hearing loss and sinus pain.   Eyes: Negative for blurred vision, double vision and photophobia.  Respiratory: Negative for cough and shortness of breath.   Cardiovascular: Negative for chest pain, palpitations and leg swelling.  Gastrointestinal: Negative for abdominal pain, nausea and vomiting.  Genitourinary: Negative for flank pain.    Musculoskeletal: Positive for back pain and joint pain. Negative for myalgias.  Skin: Negative for itching and rash.  Neurological: Positive for tingling. Negative for tremors, focal weakness and weakness.  Endo/Heme/Allergies: Negative.   Psychiatric/Behavioral: Positive for depression.  All other systems reviewed and are negative.  Otherwise per HPI.  Assessment & Plan: Visit Diagnoses:  1. Lumbar radiculopathy   2. Radiculopathy due to lumbar intervertebral disc disorder   3. Paresthesia of skin     Plan: Findings:  Chronic worsening severe at times right radicular leg pain from lateral disc herniation at L5-S1 with facet arthropathy at this level.  No high-grade central stenosis or nerve compression.  Has done well with intermittent injections in the last one seems to help quite a bit.  She still at a point where is not where she would like to be it does affect her at night.  I think the best approach is to try to add slowly some gabapentin particularly at night and see if this will help her sleep at night and also give her some pain relief of the nerve pain.  Would like to slowly work her up to taking this during the day if possible.  At her age we will start this out slowly.  I did write a prescription for this.  We also wrote a prescription for physical therapy to try to get this worked on from a posture and core and manual treatment type  of effect.  Went over this at length with the patient and her son.  We will see her back as needed at this point but also can be in touch with her over the phone to increase the gabapentin if needed.  Would consider repeat injection when the symptoms were severe enough.  The disc herniation can improve over time we hope this is what happens with her.    Meds & Orders:  Meds ordered this encounter  Medications  . gabapentin (NEURONTIN) 300 MG capsule    Sig: Take 1 capsule by mouth at night for 7 nights and then 1 in the morning and one at night for 7  days and then 3 times a day.    Dispense:  90 capsule    Refill:  0   No orders of the defined types were placed in this encounter.   Follow-up: Return if symptoms worsen or fail to improve.   Procedures: No procedures performed  No notes on file   Clinical History: MRI LUMBAR SPINE WITHOUT CONTRAST  TECHNIQUE: Multiplanar, multisequence MR imaging of the lumbar spine was performed. No intravenous contrast was administered.  COMPARISON: Lumbar radiographs 12/21/2017  FINDINGS: Segmentation: Normal segmentation  Alignment: Mild retrolisthesis T12-L1, L1-2, L2-3. Lumbar dextroscoliosis  Vertebrae: Negative for fracture or mass. Negative for pars defect.  Conus medullaris and cauda equina: Conus extends to the L1-2 level. Conus and cauda equina appear normal.  Paraspinal and other soft tissues: Negative  Disc levels:  L1-2: Mild retrolisthesis. Mild disc degeneration and spurring. No significant spinal stenosis. Neural foramina patent.  L2-3: Mild retrolisthesis. Mild disc degeneration with disc bulging and diffuse endplate spurring. Mild facet hypertrophy bilaterally. Mild left foraminal narrowing. Spinal canal adequate in size.  L3-4: Mild disc and facet degeneration without stenosis  L4-5: Mild disc degeneration and moderate facet degeneration. No significant spinal or foraminal stenosis.  L5-S1: Diffuse bulging of the disc. Small annular fissure centrally. Small extraforaminal disc protrusion on the right with impingement of the right L5 nerve root. Moderate facet hypertrophy bilaterally. Mild to moderate right foraminal stenosis.  IMPRESSION: Mild left foraminal narrowing L2-3 due to spurring  Small extraforaminal disc protrusion on the right L5-S1 with impingement of the right L5 nerve root. Mild-to-moderate right foraminal stenosis due to spurring.   Electronically Signed By: Franchot Gallo M.D. On: 12/31/2017 14:04   She reports  that she has never smoked. She has never used smokeless tobacco. No results for input(s): HGBA1C, LABURIC in the last 8760 hours.  Objective:  VS:  HT:    WT:   BMI:     BP:(!) 161/95  HR:82bpm  TEMP: ( )  RESP:  Physical Exam  Constitutional: She is oriented to person, place, and time. She appears well-developed and well-nourished.  Eyes: Pupils are equal, round, and reactive to light. Conjunctivae and EOM are normal.  Cardiovascular: Normal rate and intact distal pulses.  Pulmonary/Chest: Effort normal.  Musculoskeletal:  Patient is slow to rise from a seated position.  She does have concordant back pain with extension rotation of the lumbar spine more right than left with facet joint loading.  She has no pain over the greater trochanter no pain with hip rotation she has good distal strength.  Neurological: She is alert and oriented to person, place, and time. She exhibits normal muscle tone. Coordination normal.  Skin: Skin is warm and dry. No rash noted. No erythema.  Psychiatric: She has a normal mood and affect. Her behavior is normal.  Nursing note and vitals reviewed.   Ortho Exam Imaging: No results found.  Past Medical/Family/Surgical/Social History: Medications & Allergies reviewed per EMR, new medications updated. Patient Active Problem List   Diagnosis Date Noted  . Chronic low back pain 12/21/2017  . Chronic antibiotic suppression 06/13/2017  . History of hip replacement 06/09/2017  . Ecchymosis 02/23/2017  . Closed intertrochanteric fracture with nonunion, left 01/26/2017  . Leukocytosis 01/26/2017  . Fall 01/26/2017  . Prosthetic hip infection ([place]) 08/22/2016  . Tendinopathy 08/22/2016  . Clinical depression 04/07/2016  . Arthritis of knee, degenerative 04/07/2016  . OP (osteoporosis) 04/07/2016  . 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 Chronic anticoagulation 08/06/2015  . Bipolar disorder ([place]) 08/04/2015  . Hyponatremia 08/04/2015  .  Constipation 08/04/2015  . Complication of anesthesia 07/30/2015  . Diabetes mellitus type 2, diet-controlled ([place])   . Skin cancer of face   . Paroxysmal atrial fibrillation ([place]) 09/01/2014  . Right carotid bruit 11/18/2013  . Postoperative anemia due to acute blood loss 03/03/2012  . Hypertension   . Hyperlipidemia   . Swelling of extremity   . Cataract   . Hypothyroidism   . Arthritis   . Insomnia    Past Medical History:  Diagnosis Date  . Anxiety attack    "take RX prn" (08/13/2013)  . Arthritis    "probably" (08/13/2013)  . Atrial fibrillation ([place])    new onset/pt report 08/13/2013  . Bronchitis 12/2011  . Complication of anesthesia   . Depression   . Diabetes mellitus type 2, diet-controlled ([place])    denies  . Femur fracture ([facility])    left proximal femur non-union  . GERD (gastroesophageal reflux disease)   . H/O hiatal hernia   . Hardware complicating wound infection ([place]) 08/22/2016  . History of blood transfusion    "w/both knee OR's" (08/13/2013)  . Hyperlipidemia   . Hyperlipidemia   . Hypertension   . Hypothyroidism    takes synthroid  . Insomnia   . Intertrochanteric fracture of right hip ([place]) 07/30/2015  . Neuromuscular disorder ([place])    carpal tunnel in right hand  . Periprosthetic fracture around internal prosthetic right hip joint ([place]), nonunion basicervical femoral neck with broken hardware and subtrochanteric fracture 03/16/2016  . PONV (postoperative nausea and vomiting)   . Prosthetic hip infection ([place]) 08/22/2016  . Proteus infection 08/22/2016  . Serratia infection 11/14/2016  . Skin cancer of face 2000's   "one on each side of my face and one on my nose" (08/13/2013)  . Swelling of extremity    sees Dr. Gwenlyn Found, cardiologist 1 x year [phone]  . Tendon dysfunction 08/22/2016  . Wears hearing aid    HOH   Family History  Problem Relation Age of Onset  . Arrhythmia Mother   . Heart disease Mother   . Arrhythmia Sister   . Stroke Father 30  .  Heart disease Brother   . Heart disease Brother   . Cancer Brother        Colon cancer  . Arrhythmia Brother   . Heart disease Brother   . Heart disease Sister   . Heart disease Sister   . Stroke Sister   . Diabetes Sister   . Heart disease Child 48  . Heart disease Child 92   Past Surgical History:  Procedure Laterality Date  . 2D ECHOCARDIOGRAM  12/07/2004   EF 48%, moderate pulmonary hypertension  . BREAST BIOPSY Left ?1970's  . CARDIOVASCULAR STRESS TEST  12/07/11  .  CARDIOVASCULAR STRESS TEST  12/07/2011   LEXISCAN, normal scan, no significant wall abnormalities noted  . CARPAL TUNNEL RELEASE Right ~ 2005  . CATARACT EXTRACTION, BILATERAL Bilateral 2000  . DILATION AND CURETTAGE OF UTERUS  1950's  . DOPPLER ECHOCARDIOGRAPHY  12/07/11  . EYE SURGERY    . FEMUR IM NAIL Right 08/01/2015   Procedure: INTRAMEDULLARY (IM) NAIL FEMORAL;  Surgeon: Marchia Bond, MD;  Location: [facility];  Service: Orthopedics;  Laterality: Right;  . HARDWARE REMOVAL Right 03/16/2016   Procedure: HARDWARE REMOVAL;  Surgeon: Marchia Bond, MD;  Location: [facility];  Service: Orthopedics;  Laterality: Right;  . INTRAMEDULLARY (IM) NAIL INTERTROCHANTERIC Left 01/26/2017   Procedure: INTRAMEDULLARY (IM) NAIL INTERTROCHANTRIC;  Surgeon: Leandrew Koyanagi, MD;  Location: [facility];  Service: Orthopedics;  Laterality: Left;  . JOINT REPLACEMENT     bilateral knee replacement  . SKIN CANCER EXCISION  2000's   "off my nose" (08/13/2013)  . TOTAL HIP ARTHROPLASTY Right 03/16/2016   Procedure: TOTAL HIP ARTHROPLASTY;  Surgeon: Marchia Bond, MD;  Location: [facility];  Service: Orthopedics;  Laterality: Right;  . TOTAL HIP ARTHROPLASTY Left 06/09/2017   Procedure: REMOVE LEFT HIP INTRAMEDULLARY NAIL AND CONVERT TO LEFT CEMENTED HEMI HIP ARTHROPLASTY;  Surgeon: Leandrew Koyanagi, MD;  Location: [facility];  Service: Orthopedics;  Laterality: Left;  . TOTAL KNEE ARTHROPLASTY Right 2005  . TOTAL KNEE ARTHROPLASTY  03/02/2012   Procedure: TOTAL KNEE  ARTHROPLASTY;  Surgeon: Yvette Rack., MD;  Location: [facility];  Service: Orthopedics;  Laterality: Left;   Social History   Occupational History  . Not on file  Tobacco Use  . Smoking status: Never Smoker  . Smokeless tobacco: Never Used  Substance and Sexual Activity  . Alcohol use: No  . Drug use: No  . Sexual activity: Never

## 2018-09-06 DIAGNOSIS — M6281 Muscle weakness (generalized): Secondary | ICD-10-CM | POA: Diagnosis not present

## 2018-09-06 DIAGNOSIS — I4891 Unspecified atrial fibrillation: Secondary | ICD-10-CM | POA: Diagnosis not present

## 2018-09-06 DIAGNOSIS — M79604 Pain in right leg: Secondary | ICD-10-CM | POA: Diagnosis not present

## 2018-09-06 DIAGNOSIS — M25571 Pain in right ankle and joints of right foot: Secondary | ICD-10-CM | POA: Diagnosis not present

## 2018-09-06 DIAGNOSIS — Z9181 History of falling: Secondary | ICD-10-CM | POA: Diagnosis not present

## 2018-09-06 DIAGNOSIS — R278 Other lack of coordination: Secondary | ICD-10-CM | POA: Diagnosis not present

## 2018-09-10 DIAGNOSIS — R278 Other lack of coordination: Secondary | ICD-10-CM | POA: Diagnosis not present

## 2018-09-10 DIAGNOSIS — Z9181 History of falling: Secondary | ICD-10-CM | POA: Diagnosis not present

## 2018-09-10 DIAGNOSIS — M79604 Pain in right leg: Secondary | ICD-10-CM | POA: Diagnosis not present

## 2018-09-10 DIAGNOSIS — M25571 Pain in right ankle and joints of right foot: Secondary | ICD-10-CM | POA: Diagnosis not present

## 2018-09-10 DIAGNOSIS — I4891 Unspecified atrial fibrillation: Secondary | ICD-10-CM | POA: Diagnosis not present

## 2018-09-10 DIAGNOSIS — M6281 Muscle weakness (generalized): Secondary | ICD-10-CM | POA: Diagnosis not present

## 2018-09-12 DIAGNOSIS — M6281 Muscle weakness (generalized): Secondary | ICD-10-CM | POA: Diagnosis not present

## 2018-09-12 DIAGNOSIS — M21751 Unequal limb length (acquired), right femur: Secondary | ICD-10-CM | POA: Diagnosis not present

## 2018-09-12 DIAGNOSIS — E1300 Other specified diabetes mellitus with hyperosmolarity without nonketotic hyperglycemic-hyperosmolar coma (NKHHC): Secondary | ICD-10-CM | POA: Diagnosis not present

## 2018-09-12 DIAGNOSIS — K440 Diaphragmatic hernia with obstruction, without gangrene: Secondary | ICD-10-CM | POA: Diagnosis not present

## 2018-09-12 DIAGNOSIS — K219 Gastro-esophageal reflux disease without esophagitis: Secondary | ICD-10-CM | POA: Diagnosis not present

## 2018-09-12 DIAGNOSIS — E785 Hyperlipidemia, unspecified: Secondary | ICD-10-CM | POA: Diagnosis not present

## 2018-09-12 DIAGNOSIS — E039 Hypothyroidism, unspecified: Secondary | ICD-10-CM | POA: Diagnosis not present

## 2018-09-12 DIAGNOSIS — M25571 Pain in right ankle and joints of right foot: Secondary | ICD-10-CM | POA: Diagnosis not present

## 2018-09-12 DIAGNOSIS — I4891 Unspecified atrial fibrillation: Secondary | ICD-10-CM | POA: Diagnosis not present

## 2018-09-12 DIAGNOSIS — F419 Anxiety disorder, unspecified: Secondary | ICD-10-CM | POA: Diagnosis not present

## 2018-09-12 DIAGNOSIS — Z4789 Encounter for other orthopedic aftercare: Secondary | ICD-10-CM | POA: Diagnosis not present

## 2018-09-12 DIAGNOSIS — M79604 Pain in right leg: Secondary | ICD-10-CM | POA: Diagnosis not present

## 2018-09-12 DIAGNOSIS — M1990 Unspecified osteoarthritis, unspecified site: Secondary | ICD-10-CM | POA: Diagnosis not present

## 2018-09-12 DIAGNOSIS — R29898 Other symptoms and signs involving the musculoskeletal system: Secondary | ICD-10-CM | POA: Diagnosis not present

## 2018-09-12 DIAGNOSIS — Z9181 History of falling: Secondary | ICD-10-CM | POA: Diagnosis not present

## 2018-09-12 DIAGNOSIS — M25551 Pain in right hip: Secondary | ICD-10-CM | POA: Diagnosis not present

## 2018-09-12 DIAGNOSIS — K5909 Other constipation: Secondary | ICD-10-CM | POA: Diagnosis not present

## 2018-09-12 DIAGNOSIS — I10 Essential (primary) hypertension: Secondary | ICD-10-CM | POA: Diagnosis not present

## 2018-09-12 DIAGNOSIS — F5101 Primary insomnia: Secondary | ICD-10-CM | POA: Diagnosis not present

## 2018-09-12 DIAGNOSIS — R2681 Unsteadiness on feet: Secondary | ICD-10-CM | POA: Diagnosis not present

## 2018-09-19 ENCOUNTER — Other Ambulatory Visit: Payer: Self-pay | Admitting: Cardiovascular Disease

## 2018-09-25 ENCOUNTER — Other Ambulatory Visit (INDEPENDENT_AMBULATORY_CARE_PROVIDER_SITE_OTHER): Payer: Self-pay | Admitting: Physical Medicine and Rehabilitation

## 2018-09-25 DIAGNOSIS — M79604 Pain in right leg: Secondary | ICD-10-CM | POA: Diagnosis not present

## 2018-09-25 DIAGNOSIS — Z9181 History of falling: Secondary | ICD-10-CM | POA: Diagnosis not present

## 2018-09-25 DIAGNOSIS — M6281 Muscle weakness (generalized): Secondary | ICD-10-CM | POA: Diagnosis not present

## 2018-09-25 DIAGNOSIS — M25571 Pain in right ankle and joints of right foot: Secondary | ICD-10-CM | POA: Diagnosis not present

## 2018-09-25 DIAGNOSIS — I4891 Unspecified atrial fibrillation: Secondary | ICD-10-CM | POA: Diagnosis not present

## 2018-09-25 DIAGNOSIS — R2681 Unsteadiness on feet: Secondary | ICD-10-CM | POA: Diagnosis not present

## 2018-09-25 NOTE — Telephone Encounter (Signed)
Please advise 

## 2018-09-28 DIAGNOSIS — Z23 Encounter for immunization: Secondary | ICD-10-CM | POA: Diagnosis not present

## 2018-10-11 ENCOUNTER — Telehealth (INDEPENDENT_AMBULATORY_CARE_PROVIDER_SITE_OTHER): Payer: Self-pay | Admitting: Radiology

## 2018-10-11 NOTE — Telephone Encounter (Signed)
Patients son Sharlet [place].  Requesting physical therapy referral for left leg at [facility].  States everything is doing well with right leg. Last OV 08/17/2018. Last injection 05/24/2018 right L5-S1 facet injection. States physical therapy has really helped right leg. Is now having same symptoms on left leg and would like a physical therapy referral.   Sons call back # [phone]

## 2018-10-12 NOTE — Telephone Encounter (Signed)
Please start Script for me for left leg pain and gait for PT

## 2018-10-17 ENCOUNTER — Other Ambulatory Visit: Payer: Self-pay | Admitting: Infectious Disease

## 2018-10-18 DIAGNOSIS — Z4789 Encounter for other orthopedic aftercare: Secondary | ICD-10-CM | POA: Diagnosis not present

## 2018-10-18 DIAGNOSIS — R29898 Other symptoms and signs involving the musculoskeletal system: Secondary | ICD-10-CM | POA: Diagnosis not present

## 2018-10-18 DIAGNOSIS — M79605 Pain in left leg: Secondary | ICD-10-CM | POA: Diagnosis not present

## 2018-10-18 DIAGNOSIS — M6281 Muscle weakness (generalized): Secondary | ICD-10-CM | POA: Diagnosis not present

## 2018-10-18 DIAGNOSIS — F419 Anxiety disorder, unspecified: Secondary | ICD-10-CM | POA: Diagnosis not present

## 2018-10-18 DIAGNOSIS — E1300 Other specified diabetes mellitus with hyperosmolarity without nonketotic hyperglycemic-hyperosmolar coma (NKHHC): Secondary | ICD-10-CM | POA: Diagnosis not present

## 2018-10-18 DIAGNOSIS — M545 Low back pain: Secondary | ICD-10-CM | POA: Diagnosis not present

## 2018-10-18 DIAGNOSIS — M25551 Pain in right hip: Secondary | ICD-10-CM | POA: Diagnosis not present

## 2018-10-18 DIAGNOSIS — I4891 Unspecified atrial fibrillation: Secondary | ICD-10-CM | POA: Diagnosis not present

## 2018-10-18 DIAGNOSIS — R278 Other lack of coordination: Secondary | ICD-10-CM | POA: Diagnosis not present

## 2018-10-18 DIAGNOSIS — R2681 Unsteadiness on feet: Secondary | ICD-10-CM | POA: Diagnosis not present

## 2018-10-22 ENCOUNTER — Encounter: Payer: Self-pay | Admitting: Infectious Disease

## 2018-10-22 ENCOUNTER — Ambulatory Visit (INDEPENDENT_AMBULATORY_CARE_PROVIDER_SITE_OTHER): Payer: Medicare Other | Admitting: Infectious Disease

## 2018-10-22 VITALS — BP 199/95 | HR 80 | Temp 97.6°F | Wt 139.0 lb

## 2018-10-22 DIAGNOSIS — M25562 Pain in left knee: Secondary | ICD-10-CM

## 2018-10-22 DIAGNOSIS — M5442 Lumbago with sciatica, left side: Secondary | ICD-10-CM

## 2018-10-22 DIAGNOSIS — Z87311 Personal history of (healed) other pathological fracture: Secondary | ICD-10-CM

## 2018-10-22 DIAGNOSIS — Z96643 Presence of artificial hip joint, bilateral: Secondary | ICD-10-CM

## 2018-10-22 DIAGNOSIS — T8459XD Infection and inflammatory reaction due to other internal joint prosthesis, subsequent encounter: Secondary | ICD-10-CM

## 2018-10-22 DIAGNOSIS — T8451XD Infection and inflammatory reaction due to internal right hip prosthesis, subsequent encounter: Secondary | ICD-10-CM | POA: Diagnosis not present

## 2018-10-22 DIAGNOSIS — S72142K Displaced intertrochanteric fracture of left femur, subsequent encounter for closed fracture with nonunion: Secondary | ICD-10-CM

## 2018-10-22 DIAGNOSIS — Z881 Allergy status to other antibiotic agents status: Secondary | ICD-10-CM | POA: Diagnosis not present

## 2018-10-22 DIAGNOSIS — Z96653 Presence of artificial knee joint, bilateral: Secondary | ICD-10-CM

## 2018-10-22 DIAGNOSIS — Z885 Allergy status to narcotic agent status: Secondary | ICD-10-CM

## 2018-10-22 DIAGNOSIS — M545 Low back pain: Secondary | ICD-10-CM

## 2018-10-22 DIAGNOSIS — Z8619 Personal history of other infectious and parasitic diseases: Secondary | ICD-10-CM

## 2018-10-22 DIAGNOSIS — I160 Hypertensive urgency: Secondary | ICD-10-CM | POA: Diagnosis not present

## 2018-10-22 DIAGNOSIS — G8929 Other chronic pain: Secondary | ICD-10-CM

## 2018-10-22 DIAGNOSIS — Z96649 Presence of unspecified artificial hip joint: Secondary | ICD-10-CM | POA: Diagnosis not present

## 2018-10-22 DIAGNOSIS — Z792 Long term (current) use of antibiotics: Secondary | ICD-10-CM

## 2018-10-22 HISTORY — DX: Hypertensive urgency: I16.0

## 2018-10-22 MED ORDER — SULFAMETHOXAZOLE-TRIMETHOPRIM 400-80 MG PO TABS: 1.0000 | ORAL_TABLET | Freq: Two times a day (BID) | ORAL | 11 refills | Status: DC

## 2018-10-22 NOTE — Progress Notes (Signed)
Subjective:   Chief complaint:  Leah Holland is complaining of sciatic pain down her left side beginning her back going down the left thigh and into the left calf.  She also complains of left knee pain which she thinks is due to problems with edema in her calf and in her knee.  She is wearing a brace there presently.  She denies having any problems with right hip pain at present.  [name], female    DOB: [birth year], 84 y.o.   MRN: [id number]  HPI  Leah Holland is a 84 year old lady with admission in April of 2017 with right intertrochanteric proximal femur nonunion who progressed to ultimately break her intramedullary nail and elected for surgical management. She had removal of hardware including intramedullary nail, and treatment of intertrochanteric fracture with prosthesis placement. Dr. [name] got several intra-operative cultures due to concerrn for infection and two separate cultures grew serratia. Patient was placed on oral levaquin after discussion with me and has remained on this some time.. She has done relatively well but recently began to complain of pain behind her prosthetic knees which she believed might be due to toxicity of fluoroquinolone to tendons after she and her son--who is a Pediatrician read about side effects of FQ.   I was  actually skeptical about FQ toxicity here but I DID think she would be better served over the long run if she is to be on chronic suppressive antibiotics NOT being on a fluoroquinolone given risk for Clostridium difficile colitis in particular. We changed her  over to single strength Bactrim and her tendon pain went away immediately. She has had normal creatinine and potassium while on single strength Bactrim.  She then  fractured her opposite hip and underwent IM nail by Dr. Erlinda Hong who is following her closely.    She ended up needing a THA on that side as well.   Bilateral knee replacements as well.      Past  Medical History:  Diagnosis Date  . Anxiety attack    "take RX prn" (08/13/2013)  . Arthritis    "probably" (08/13/2013)  . Atrial fibrillation ([place])    new onset/pt report 08/13/2013  . Bronchitis 12/2011  . Complication of anesthesia   . Depression   . Diabetes mellitus type 2, diet-controlled ([place])    denies  . Femur fracture ([facility])    left proximal femur non-union  . GERD (gastroesophageal reflux disease)   . H/O hiatal hernia   . Hardware complicating wound infection ([place]) 08/22/2016  . History of blood transfusion    "w/both knee OR's" (08/13/2013)  . Hyperlipidemia   . Hyperlipidemia   . Hypertension   . Hypothyroidism    takes synthroid  . Insomnia   . Intertrochanteric fracture of right hip ([place]) 07/30/2015  . Neuromuscular disorder ([place])    carpal tunnel in right hand  . Periprosthetic fracture around internal prosthetic right hip joint ([place]), nonunion basicervical femoral neck with broken hardware and subtrochanteric fracture 03/16/2016  . PONV (postoperative nausea and vomiting)   . 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 Proteus infection 08/22/2016  . Serratia infection 11/14/2016  . Skin cancer of face 2000's   "one on each side of my face and one on my nose" (08/13/2013)  . Swelling of extremity    sees Dr. Gwenlyn Found, cardiologist 1 x year [phone]  . Tendon dysfunction 08/22/2016  . Wears hearing aid    [facility]  Past Surgical History:  Procedure Laterality Date  . 2D ECHOCARDIOGRAM  12/07/2004   EF 48%, moderate pulmonary hypertension  . BREAST BIOPSY Left ?1970's  . CARDIOVASCULAR STRESS TEST  12/07/11  . CARDIOVASCULAR STRESS TEST  12/07/2011   LEXISCAN, normal scan, no significant wall abnormalities noted  . CARPAL TUNNEL RELEASE Right ~ 2005  . CATARACT EXTRACTION, BILATERAL Bilateral 2000  . DILATION AND CURETTAGE OF UTERUS  1950's  . DOPPLER ECHOCARDIOGRAPHY  12/07/11  . EYE SURGERY    . FEMUR IM NAIL Right 08/01/2015   Procedure: INTRAMEDULLARY (IM) NAIL  FEMORAL;  Surgeon: Marchia Bond, MD;  Location: [place];  Service: Orthopedics;  Laterality: Right;  . HARDWARE REMOVAL Right 03/16/2016   Procedure: HARDWARE REMOVAL;  Surgeon: Marchia Bond, MD;  Location: [place];  Service: Orthopedics;  Laterality: Right;  . INTRAMEDULLARY (IM) NAIL INTERTROCHANTERIC Left 01/26/2017   Procedure: INTRAMEDULLARY (IM) NAIL INTERTROCHANTRIC;  Surgeon: Leandrew Koyanagi, MD;  Location: [place];  Service: Orthopedics;  Laterality: Left;  . JOINT REPLACEMENT     bilateral knee replacement  . SKIN CANCER EXCISION  2000's   "off my nose" (08/13/2013)  . TOTAL HIP ARTHROPLASTY Right 03/16/2016   Procedure: TOTAL HIP ARTHROPLASTY;  Surgeon: Marchia Bond, MD;  Location: [place];  Service: Orthopedics;  Laterality: Right;  . TOTAL HIP ARTHROPLASTY Left 06/09/2017   Procedure: REMOVE LEFT HIP INTRAMEDULLARY NAIL AND CONVERT TO LEFT CEMENTED HEMI HIP ARTHROPLASTY;  Surgeon: Leandrew Koyanagi, MD;  Location: [place];  Service: Orthopedics;  Laterality: Left;  . TOTAL KNEE ARTHROPLASTY Right 2005  . TOTAL KNEE ARTHROPLASTY  03/02/2012   Procedure: TOTAL KNEE ARTHROPLASTY;  Surgeon: Yvette Rack., MD;  Location: [place];  Service: Orthopedics;  Laterality: Left;    Family History  Problem Relation Age of Onset  . Arrhythmia Mother   . Heart disease Mother   . Arrhythmia Sister   . Stroke Father 52  . Heart disease Brother   . Heart disease Brother   . Cancer Brother        Colon cancer  . Arrhythmia Brother   . Heart disease Brother   . Heart disease Sister   . Heart disease Sister   . Stroke Sister   . Diabetes Sister   . Heart disease Child 42  . Heart disease Child 30      Social History   Socioeconomic History  . Marital status: Widowed    Spouse name: Not on file  . Number of children: Not on file  . Years of education: Not on file  . Highest education level: Not on file  Occupational History  . Not on file  Social Needs  . Financial resource strain: Not on file  .  Food insecurity:    Worry: Not on file    Inability: Not on file  . Transportation needs:    Medical: Not on file    Non-medical: Not on file  Tobacco Use  . Smoking status: Never Smoker  . Smokeless tobacco: Never Used  Substance and Sexual Activity  . Alcohol use: No  . Drug use: No  . Sexual activity: Never  Lifestyle  . Physical activity:    Days per week: Not on file    Minutes per session: Not on file  . Stress: Not on file  Relationships  . Social connections:    Talks on phone: Not on file    Gets together: Not on file    Attends religious  service: Not on file    Active member of club or organization: Not on file    Attends meetings of clubs or organizations: Not on file    Relationship status: Not on file  Other Topics Concern  . Not on file  Social History Narrative   Widowed. Lives at [facility]. Son died in the [place] War and she has not slept well since then.   Admitted to [facility] 01/30/17   Never smoked   Alcohol none   Full Code    Allergies  Allergen Reactions  . Levaquin [Levofloxacin] Other (See Comments)    tendinopathy  . Morphine And Related Other (See Comments)    Made pt "wild"     Current Outpatient Medications:  .  acetaminophen (TYLENOL) 325 MG tablet, Take 650 mg by mouth every 6 (six) hours as needed for moderate pain. , Disp: , Rfl:  .  aspirin EC 81 MG tablet, Take 81 mg by mouth daily., Disp: , Rfl:  .  atorvastatin (LIPITOR) 80 MG tablet, Take 80 mg by mouth daily., Disp: , Rfl:  .  calcium carbonate (OS-CAL - DOSED IN MG OF ELEMENTAL CALCIUM) 1250 (500 Ca) MG tablet, Take 1 tablet by mouth every evening., Disp: , Rfl:  .  Cholecalciferol 2000 units CAPS, Take 2,000 Units by mouth every evening., Disp: , Rfl:  .  denosumab (PROLIA) 60 MG/ML SOLN injection, Inject 60 mg into the skin every 6 (six) months. Administer in upper arm, thigh, or abdomen, Disp: , Rfl:  .  diltiazem (CARDIZEM CD) 180 MG 24 hr capsule,  TAKE 1 CAPSULE DAILY., Disp: 90 capsule, Rfl: 3 .  DULoxetine (CYMBALTA) 60 MG capsule, Take 60 mg by mouth daily. , Disp: , Rfl:  .  ELIQUIS 2.5 MG TABS tablet, TAKE 1 TABLET BY MOUTH TWICE DAILY., Disp: 60 tablet, Rfl: 5 .  esomeprazole (NEXIUM) 20 MG capsule, Take 20 mg by mouth daily at 12 noon., Disp: , Rfl:  .  feeding supplement (BOOST HIGH PROTEIN) LIQD, Take 237 mLs by mouth 3 (three) times daily between meals. Prefers chocolate diabetic and peanut butter crackers before bedtime., Disp: , Rfl:  .  Flaxseed, Linseed, (FLAXSEED OIL) 1000 MG CAPS, Take 1,000 mg by mouth 2 (two) times daily. , Disp: , Rfl:  .  furosemide (LASIX) 20 MG tablet, Take 1 tablet (20 mg total) by mouth as needed for edema., Disp: 30 tablet, Rfl: 5 .  gabapentin (NEURONTIN) 300 MG capsule, TAKE 1 CAPSULE AT NIGHT FOR 7 NIGHTS, THEN 1 CAP TWICE DAILY X7 NIGHTS, THEN 1 CAP 3 TIMES DAILY., Disp: 90 capsule, Rfl: 0 .  levothyroxine (SYNTHROID, LEVOTHROID) 150 MCG tablet, Take 150 mcg by mouth daily before breakfast., Disp: , Rfl:  .  lisinopril (PRINIVIL,ZESTRIL) 2.5 MG tablet, , Disp: , Rfl:  .  methocarbamol (ROBAXIN) 500 MG tablet, Take 1 tablet (500 mg total) by mouth every 8 (eight) hours as needed for muscle spasms. (Patient not taking: Reported on 05/24/2018), Disp: 15 tablet, Rfl: 0 .  metoprolol (LOPRESSOR) 50 MG tablet, Take 50 mg by mouth 2 (two) times daily., Disp: , Rfl:  .  Multiple Vitamins-Minerals (MULTIVITAMIN) tablet, Take 1 tablet by mouth 2 (two) times daily. , Disp: , Rfl:  .  ondansetron (ZOFRAN) 4 MG tablet, Take 4 mg by mouth every 8 (eight) hours as needed for nausea or vomiting., Disp: , Rfl:  .  polyethylene glycol (MIRALAX / GLYCOLAX) packet, Take 17 g by mouth  daily., Disp: 14 each, Rfl: 0 .  potassium chloride (K-DUR) 10 MEQ tablet, Take 10 mEq by mouth. Take one tablet as needed when giving lasix as needed, Disp: , Rfl:  .  saccharomyces boulardii (FLORASTOR) 250 MG capsule, Take 250 mg by  mouth 2 (two) times daily., Disp: , Rfl:  .  senna-docusate (SENOKOT S) 8.6-50 MG tablet, Take 1 tablet by mouth at bedtime as needed. (Patient not taking: Reported on 05/24/2018), Disp: 30 tablet, Rfl: 1 .  sulfamethoxazole-trimethoprim (BACTRIM,SEPTRA) 400-80 MG tablet, Take 1 tablet 2 (two) times daily by mouth., Disp: 60 tablet, Rfl: 11 .  sulfamethoxazole-trimethoprim (BACTRIM,SEPTRA) 400-80 MG tablet, TAKE 1 TABLET BY MOUTH TWICE DAILY. (Patient not taking: Reported on 05/24/2018), Disp: 60 tablet, Rfl: 2 .  sulfamethoxazole-trimethoprim (BACTRIM,SEPTRA) 400-80 MG tablet, TAKE 1 TABLET BY MOUTH TWICE DAILY., Disp: 60 tablet, Rfl: 6 .  temazepam (RESTORIL) 30 MG capsule, Take 1 capsule (30 mg total) by mouth at bedtime as needed for sleep., Disp: 2 capsule, Rfl: 0   Review of Systems  Constitutional: Negative for activity change, appetite change, chills, diaphoresis and fever.  HENT: Negative for congestion and sore throat.   Respiratory: Negative for cough and wheezing.   Cardiovascular: Negative for leg swelling.  Gastrointestinal: Negative for blood in stool, nausea and vomiting.  Genitourinary: Negative for dysuria, flank pain and hematuria.  Musculoskeletal: Positive for arthralgias. Negative for back pain.  Skin: Negative for rash.  Neurological: Negative for dizziness, weakness and headaches.  Hematological: Does not bruise/bleed easily.  Psychiatric/Behavioral: The patient is not nervous/anxious.        Objective:   Physical Exam  Constitutional: She is oriented to person, place, and time. She appears well-developed and well-nourished. No distress.  HENT:  Head: Normocephalic and atraumatic.  Mouth/Throat: No oropharyngeal exudate.  Eyes: Conjunctivae and EOM are normal. No scleral icterus.  Neck: Normal range of motion. Neck supple.  Cardiovascular: Normal rate and regular rhythm.  Pulmonary/Chest: Effort normal. No respiratory distress. She has no wheezes.  Abdominal:  She exhibits no distension.  Musculoskeletal: She exhibits no edema.       Left knee: She exhibits swelling. Tenderness found.  Neurological: She is alert and oriented to person, place, and time. She exhibits normal muscle tone. Coordination normal.  Skin: Skin is warm and dry. No rash noted. She is not diaphoretic. No erythema.  Psychiatric: She has a normal mood and affect. Her behavior is normal. Judgment normal.          Assessment & Plan:    Prosthetic hip infection: we did have several cases of serratia contaminating surgical specimens due to contaminated saline used in prep of SOME cultures done here at [facility]. However without certainty that this is a contaminant I need to keep her on antibiotics given risk esp at her age of recurrent PJI  continue Bactrim SS BID and she has tolerated this quite well. \   Opposed the idea of her coming off the antibiotic but she is very afraid of infection recurring and has a close friend of hers who is had to have multiple surgeries on her hip due to recurrent infections.  We will check safety labs and inflammatory markers today and for now continue Bactrim and revisit this in 1 years time.   Left knee pain with swelling: This is overlying a prosthesis: It is difficult to tell if this is due to problems with edema, trauma or if this is potentially going to be due to infection.  The fact that she is not having constant pain would argue against the latter but I have asked her to monitor this very closely and follow-up with Dr. Alcide Clever  Nerve type pain: Following for this and getting cortical steroid injections.  Hypertensive urgency: Blood pressure was in the 614 systolic initially rechecked into the 160s.  She is going to monitor at home she is not symptomatic at present and she will follow-up promptly with PCP  I spent greater than 25 minutes with the patient including greater than 50% of time in face to face counsel of the patient nature of  prosthetic joint infections, reasons for chronic suppressive antimicrobial therapy risks and benefits to this including risk for clustering difficile colitis, benefits which we mentioned include keeping the infection at [place] and avoiding future surgeries and in coordination of her care.

## 2018-10-23 DIAGNOSIS — R2681 Unsteadiness on feet: Secondary | ICD-10-CM | POA: Diagnosis not present

## 2018-10-23 DIAGNOSIS — M6281 Muscle weakness (generalized): Secondary | ICD-10-CM | POA: Diagnosis not present

## 2018-10-23 DIAGNOSIS — M545 Low back pain: Secondary | ICD-10-CM | POA: Diagnosis not present

## 2018-10-23 DIAGNOSIS — I4891 Unspecified atrial fibrillation: Secondary | ICD-10-CM | POA: Diagnosis not present

## 2018-10-23 DIAGNOSIS — M79605 Pain in left leg: Secondary | ICD-10-CM | POA: Diagnosis not present

## 2018-10-23 DIAGNOSIS — R278 Other lack of coordination: Secondary | ICD-10-CM | POA: Diagnosis not present

## 2018-10-23 LAB — BASIC METABOLIC PANEL WITH GFR
BUN: 15 mg/dL (ref 7–25)
CALCIUM: 10 mg/dL (ref 8.6–10.4)
CO2: 32 mmol/L (ref 20–32)
CREATININE: 0.79 mg/dL (ref 0.60–0.88)
Chloride: 102 mmol/L (ref 98–110)
GFR, Est African American: 74 mL/min/{1.73_m2} (ref >=60)
GFR, Est Non African American: 64 mL/min/{1.73_m2} (ref >=60)
GLUCOSE: 84 mg/dL (ref 65–99)
Potassium: 5.5 mmol/L — ABNORMAL HIGH (ref 3.5–5.3)
Sodium: 138 mmol/L (ref 135–146)

## 2018-10-23 LAB — CBC WITH DIFFERENTIAL/PLATELET
BASOS PCT: 0.7 %
Basophils Absolute: 48 cells/uL (ref 0–200)
Eosinophils Absolute: 290 cells/uL (ref 15–500)
Eosinophils Relative: 4.2 %
HCT: 38.2 % (ref 35.0–45.0)
Hemoglobin: 12.3 g/dL (ref 11.7–15.5)
LYMPHS ABS: 2339 {cells}/uL (ref 850–3900)
MCH: 28.5 pg (ref 27.0–33.0)
MCHC: 32.2 g/dL (ref 32.0–36.0)
MCV: 88.4 fL (ref 80.0–100.0)
MPV: 10.4 fL (ref 7.5–12.5)
Monocytes Relative: 8.9 %
Neutro Abs: 3609 cells/uL (ref 1500–7800)
Neutrophils Relative %: 52.3 %
PLATELETS: 233 10*3/uL (ref 140–400)
RBC: 4.32 10*6/uL (ref 3.80–5.10)
RDW: 13.1 % (ref 11.0–15.0)
TOTAL LYMPHOCYTE: 33.9 %
WBC: 6.9 10*3/uL (ref 3.8–10.8)
WBCMIX: 614 {cells}/uL (ref 200–950)

## 2018-10-23 LAB — C-REACTIVE PROTEIN: CRP: 1 mg/L (ref <8.0)

## 2018-10-23 LAB — SEDIMENTATION RATE: SED RATE: 2 mm/h (ref 0–30)

## 2018-10-23 IMAGING — RF DG FEMUR 2+V*L*
1 series · 5 of 5 positions shown · non-contrast
Comparison: 01/25/2017 left hip radiograph

CLINICAL DATA: ORIF left proximal femur fracture

EXAM:
LEFT FEMUR 2 VIEWS; DG C-ARM 61-120 MIN

[Series 1: run · 5 of 5 slices shown]
[im 1/5]
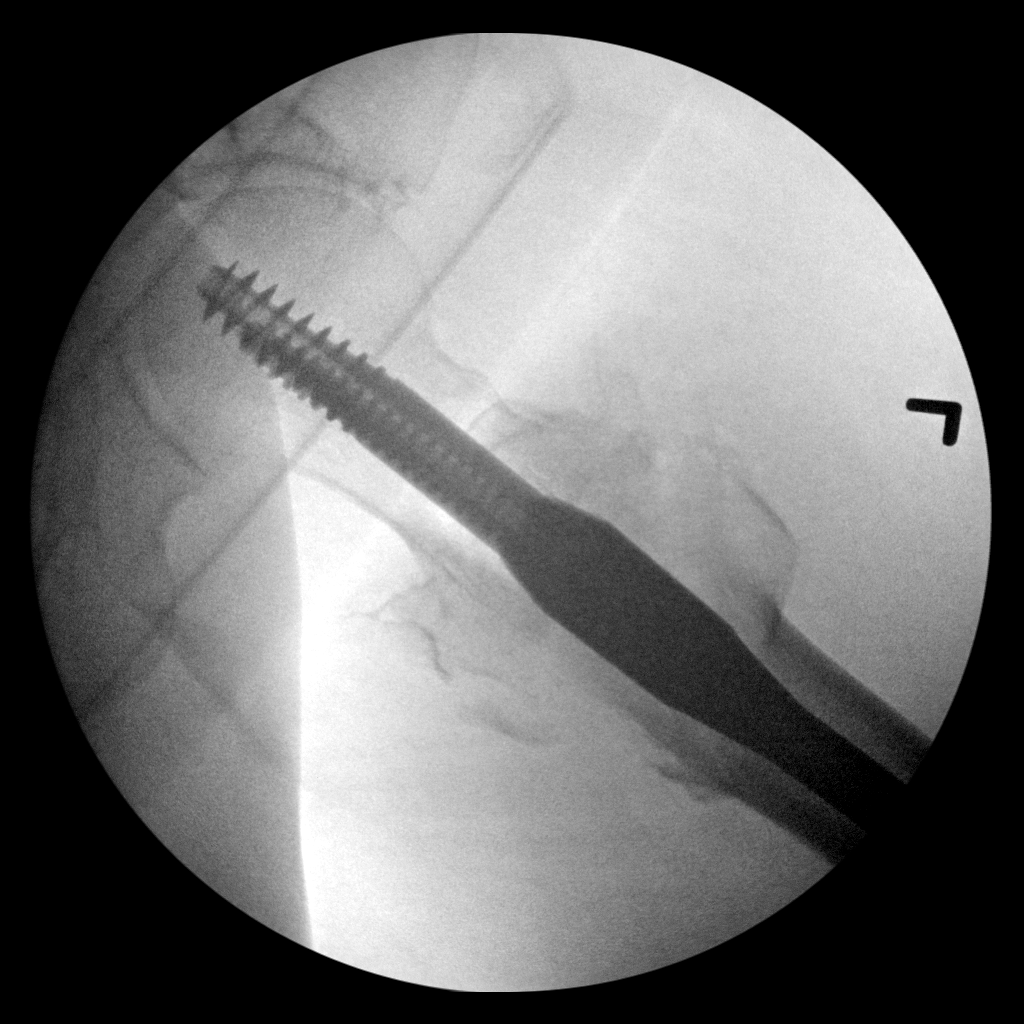
[im 2/5]
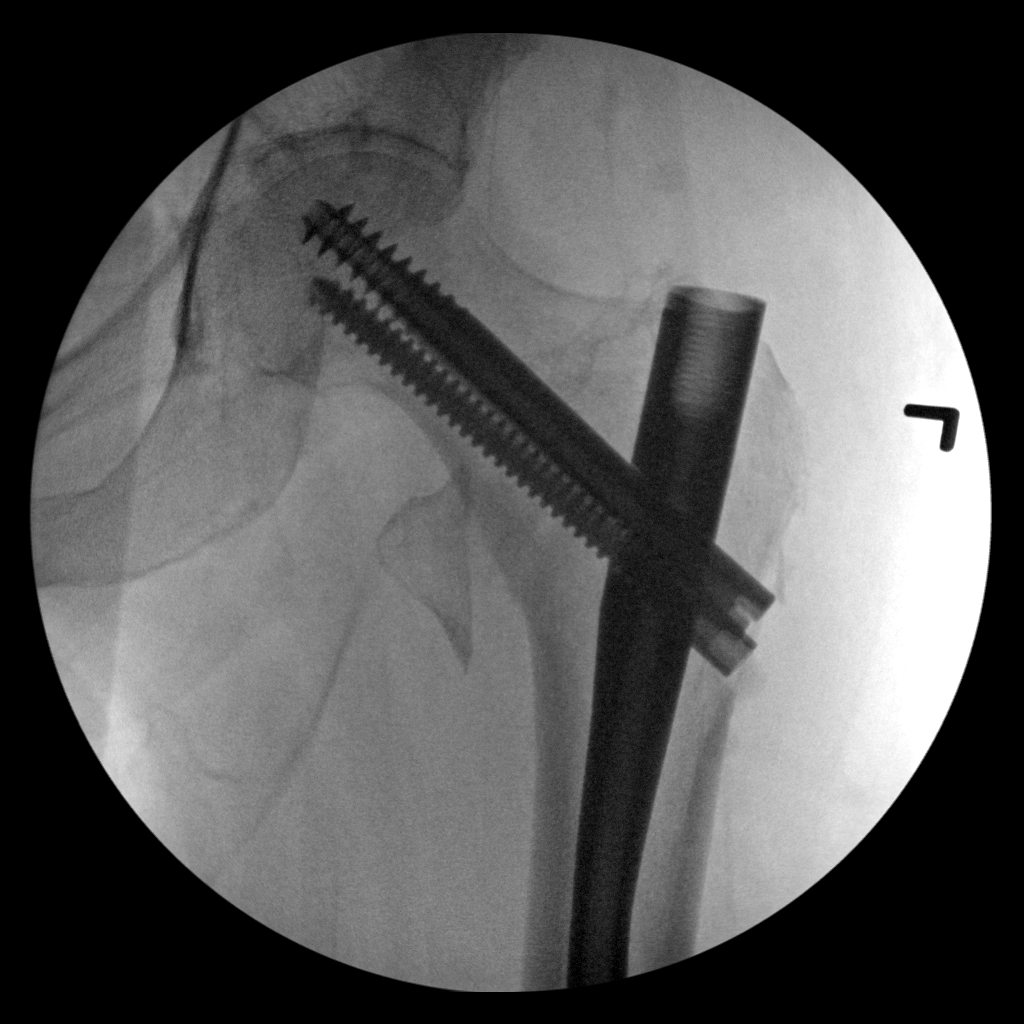
[im 3/5]
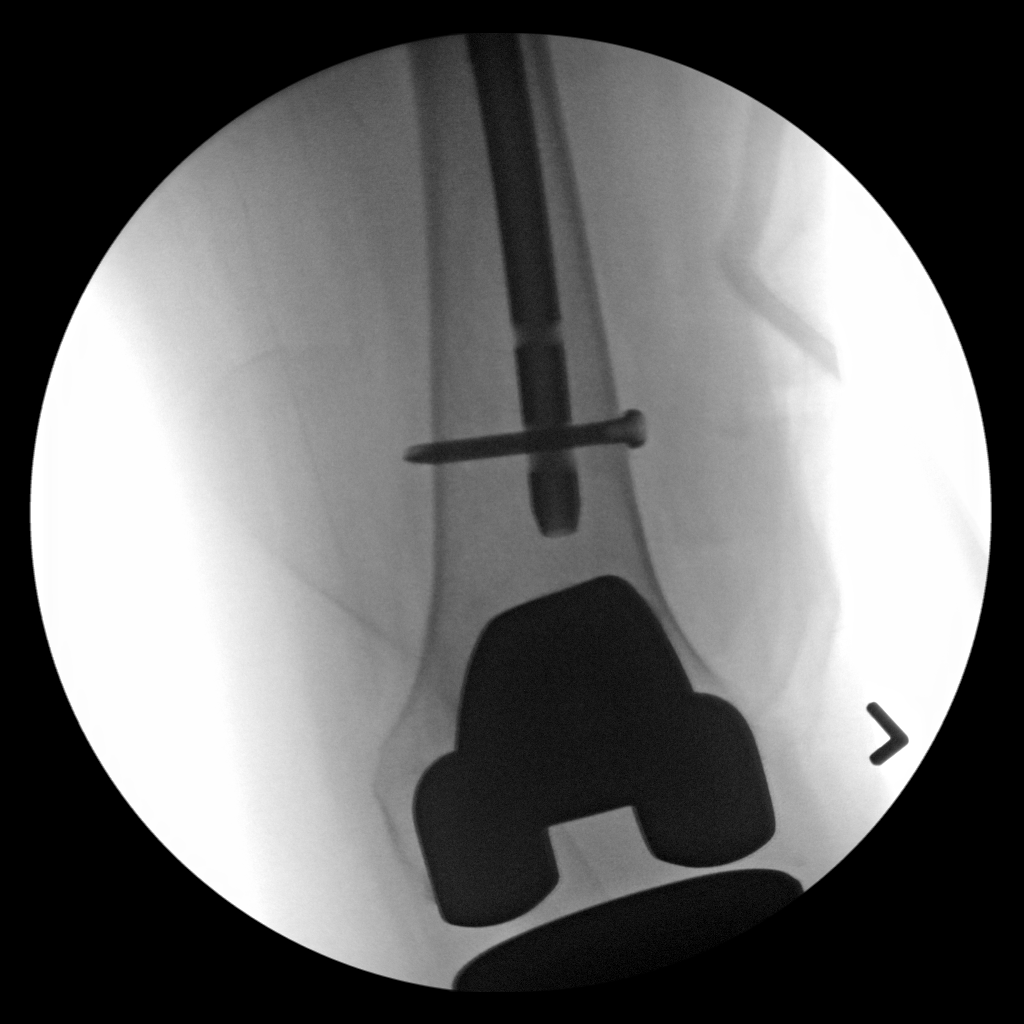
[im 4/5]
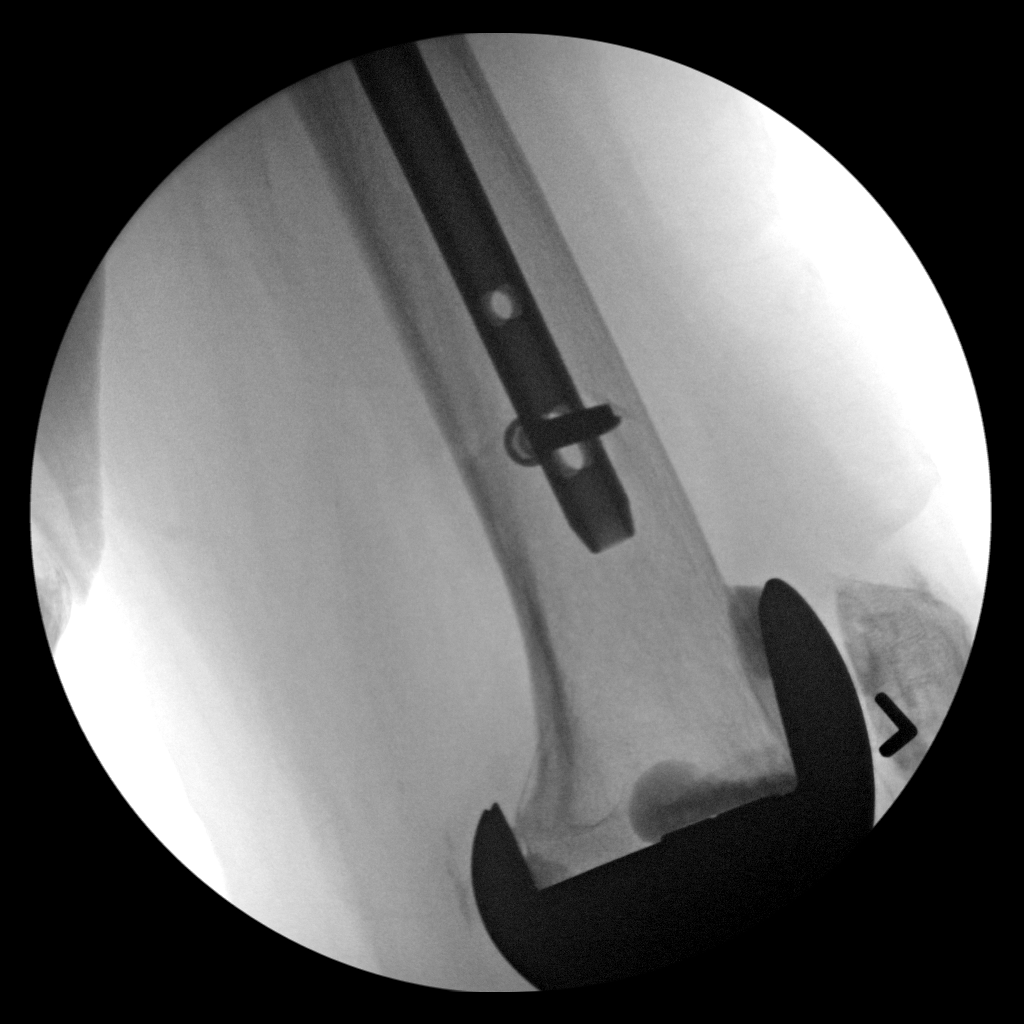
[im 5/5]
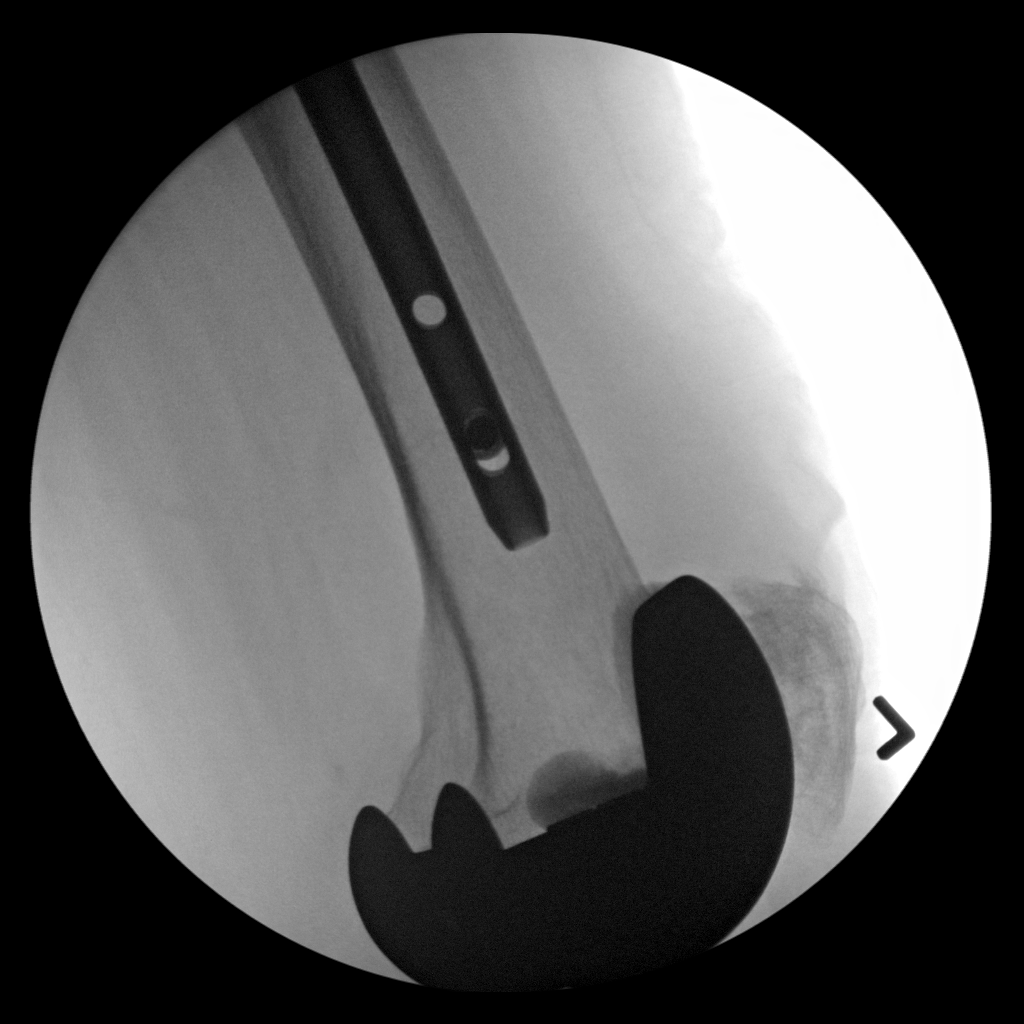

[5 of 5 positions shown; findings below may reference images not displayed]

FINDINGS: Fluoroscopy time 1 minutes 30 seconds. Multiple nondiagnostic spot
fluoroscopic intraoperative radiographs of the left femur
demonstrate postsurgical changes from transfixation of the
comminuted intertrochanteric proximal left femur fracture by
intramedullary rod with interlocking left femoral neck screws and
distal interlocking screw. Partially visualized left total knee
arthroplasty.
IMPRESSION: Intraoperative fluoroscopic guidance for ORIF left proximal femur
fracture.

## 2018-10-25 DIAGNOSIS — M545 Low back pain: Secondary | ICD-10-CM | POA: Diagnosis not present

## 2018-10-25 DIAGNOSIS — M79605 Pain in left leg: Secondary | ICD-10-CM | POA: Diagnosis not present

## 2018-10-25 DIAGNOSIS — R278 Other lack of coordination: Secondary | ICD-10-CM | POA: Diagnosis not present

## 2018-10-25 DIAGNOSIS — M6281 Muscle weakness (generalized): Secondary | ICD-10-CM | POA: Diagnosis not present

## 2018-10-25 DIAGNOSIS — I4891 Unspecified atrial fibrillation: Secondary | ICD-10-CM | POA: Diagnosis not present

## 2018-10-25 DIAGNOSIS — R2681 Unsteadiness on feet: Secondary | ICD-10-CM | POA: Diagnosis not present

## 2018-10-26 DIAGNOSIS — M545 Low back pain: Secondary | ICD-10-CM | POA: Diagnosis not present

## 2018-10-26 DIAGNOSIS — M6281 Muscle weakness (generalized): Secondary | ICD-10-CM | POA: Diagnosis not present

## 2018-10-26 DIAGNOSIS — R2681 Unsteadiness on feet: Secondary | ICD-10-CM | POA: Diagnosis not present

## 2018-10-26 DIAGNOSIS — M79605 Pain in left leg: Secondary | ICD-10-CM | POA: Diagnosis not present

## 2018-10-26 DIAGNOSIS — I4891 Unspecified atrial fibrillation: Secondary | ICD-10-CM | POA: Diagnosis not present

## 2018-10-26 DIAGNOSIS — R278 Other lack of coordination: Secondary | ICD-10-CM | POA: Diagnosis not present

## 2018-10-31 DIAGNOSIS — M6281 Muscle weakness (generalized): Secondary | ICD-10-CM | POA: Diagnosis not present

## 2018-10-31 DIAGNOSIS — R2681 Unsteadiness on feet: Secondary | ICD-10-CM | POA: Diagnosis not present

## 2018-10-31 DIAGNOSIS — R278 Other lack of coordination: Secondary | ICD-10-CM | POA: Diagnosis not present

## 2018-10-31 DIAGNOSIS — I4891 Unspecified atrial fibrillation: Secondary | ICD-10-CM | POA: Diagnosis not present

## 2018-10-31 DIAGNOSIS — M545 Low back pain: Secondary | ICD-10-CM | POA: Diagnosis not present

## 2018-10-31 DIAGNOSIS — M79605 Pain in left leg: Secondary | ICD-10-CM | POA: Diagnosis not present

## 2018-11-01 DIAGNOSIS — I4891 Unspecified atrial fibrillation: Secondary | ICD-10-CM | POA: Diagnosis not present

## 2018-11-01 DIAGNOSIS — R278 Other lack of coordination: Secondary | ICD-10-CM | POA: Diagnosis not present

## 2018-11-01 DIAGNOSIS — R2681 Unsteadiness on feet: Secondary | ICD-10-CM | POA: Diagnosis not present

## 2018-11-01 DIAGNOSIS — M545 Low back pain: Secondary | ICD-10-CM | POA: Diagnosis not present

## 2018-11-01 DIAGNOSIS — M79605 Pain in left leg: Secondary | ICD-10-CM | POA: Diagnosis not present

## 2018-11-01 DIAGNOSIS — M6281 Muscle weakness (generalized): Secondary | ICD-10-CM | POA: Diagnosis not present

## 2018-11-02 DIAGNOSIS — M79605 Pain in left leg: Secondary | ICD-10-CM | POA: Diagnosis not present

## 2018-11-02 DIAGNOSIS — R278 Other lack of coordination: Secondary | ICD-10-CM | POA: Diagnosis not present

## 2018-11-02 DIAGNOSIS — M6281 Muscle weakness (generalized): Secondary | ICD-10-CM | POA: Diagnosis not present

## 2018-11-02 DIAGNOSIS — R2681 Unsteadiness on feet: Secondary | ICD-10-CM | POA: Diagnosis not present

## 2018-11-02 DIAGNOSIS — M545 Low back pain: Secondary | ICD-10-CM | POA: Diagnosis not present

## 2018-11-02 DIAGNOSIS — I4891 Unspecified atrial fibrillation: Secondary | ICD-10-CM | POA: Diagnosis not present

## 2018-11-12 ENCOUNTER — Telehealth: Payer: Self-pay | Admitting: Cardiovascular Disease

## 2018-11-12 DIAGNOSIS — M6281 Muscle weakness (generalized): Secondary | ICD-10-CM | POA: Diagnosis not present

## 2018-11-12 DIAGNOSIS — M545 Low back pain: Secondary | ICD-10-CM | POA: Diagnosis not present

## 2018-11-12 DIAGNOSIS — R29898 Other symptoms and signs involving the musculoskeletal system: Secondary | ICD-10-CM | POA: Diagnosis not present

## 2018-11-12 DIAGNOSIS — E1300 Other specified diabetes mellitus with hyperosmolarity without nonketotic hyperglycemic-hyperosmolar coma (NKHHC): Secondary | ICD-10-CM | POA: Diagnosis not present

## 2018-11-12 DIAGNOSIS — Z4789 Encounter for other orthopedic aftercare: Secondary | ICD-10-CM | POA: Diagnosis not present

## 2018-11-12 DIAGNOSIS — R2681 Unsteadiness on feet: Secondary | ICD-10-CM | POA: Diagnosis not present

## 2018-11-12 DIAGNOSIS — M25551 Pain in right hip: Secondary | ICD-10-CM | POA: Diagnosis not present

## 2018-11-12 DIAGNOSIS — M79605 Pain in left leg: Secondary | ICD-10-CM | POA: Diagnosis not present

## 2018-11-12 NOTE — Telephone Encounter (Signed)
Attempted to contact pt. Line ringing busy.

## 2018-11-12 NOTE — Telephone Encounter (Signed)
Pt c/o BP issue: STAT if pt c/o blurred vision, one-sided weakness or slurred speech  1. What are your last 5 BP readings?  171/111 this morning  164/101 1 hr after her BP meds 162/108 930am yesterday 144/79  12 pm 144/81 9pm   2. Are you having any other symptoms (ex. Dizziness, headache, blurred vision, passed out)? States she is SOB, feels tired, hurts when she takes a deep breath.   3. What is your BP issue? Elevated BP  I did schedule patient with Kerin Ransom for tomorrow.

## 2018-11-13 ENCOUNTER — Ambulatory Visit (INDEPENDENT_AMBULATORY_CARE_PROVIDER_SITE_OTHER): Payer: Medicare Other | Admitting: Cardiology

## 2018-11-13 ENCOUNTER — Encounter: Payer: Self-pay | Admitting: Cardiology

## 2018-11-13 DIAGNOSIS — Z7901 Long term (current) use of anticoagulants: Secondary | ICD-10-CM | POA: Diagnosis not present

## 2018-11-13 DIAGNOSIS — I480 Paroxysmal atrial fibrillation: Secondary | ICD-10-CM | POA: Diagnosis not present

## 2018-11-13 DIAGNOSIS — T8459XS Infection and inflammatory reaction due to other internal joint prosthesis, sequela: Secondary | ICD-10-CM

## 2018-11-13 DIAGNOSIS — I160 Hypertensive urgency: Secondary | ICD-10-CM | POA: Diagnosis not present

## 2018-11-13 DIAGNOSIS — Z96649 Presence of unspecified artificial hip joint: Secondary | ICD-10-CM | POA: Diagnosis not present

## 2018-11-13 MED ORDER — CHLORTHALIDONE 25 MG PO TABS: 25.0000 mg | ORAL_TABLET | Freq: Every day | ORAL | 6 refills | Status: DC

## 2018-11-13 NOTE — Progress Notes (Signed)
11/13/2018 Leah Holland   [birth year]  [id number]  Primary Physician [name], MD Primary Cardiologist: Dr Gwenlyn Found  HPI:  Pleasant 84 y/o female followed by Dr Gwenlyn Found with a history of remote PAF in 2014. She converted with rate control and was placed on low dose Eliquis. She has no history of MI or CAD. Myoview in 2017 was low risk with normal LVF. Other problems include HTN, HLD, and DJD - s/p multiple hip surgeries, the last one in June 2018.  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  When she saw Dr. Drucilla Schmidt November 11 her blood pressure was elevated.  She is here in the office today with her son to check on her blood pressure.  They also tell me that she was placed on lisinopril and taken off chlorthalidone after her recent hospitalization.  She does not like taking the lisinopril, she feels like it does not work and also gives her a cough.  She would like to go back on chlorthalidone.  Interestingly we she is also in atrial fibrillation today with controlled ventricular response.  This is new for her, she has not had atrial fibrillation for some time.  Overall she is tolerating this well.  She does say that she can tell her heart rate a little irregular.   Current Outpatient Medications  Medication Sig Dispense Refill  . acetaminophen (TYLENOL) 325 MG tablet Take 650 mg by mouth every 6 (six) hours as needed for moderate pain.     Marland Kitchen atorvastatin (LIPITOR) 80 MG tablet Take 80 mg by mouth daily.    . calcium carbonate (OS-CAL - DOSED IN MG OF ELEMENTAL CALCIUM) 1250 (500 Ca) MG tablet Take 1 tablet by mouth every evening.    . Cholecalciferol 2000 units CAPS Take 2,000 Units by mouth every evening.    . denosumab (PROLIA) 60 MG/ML SOLN injection Inject 60 mg into the skin every 6 (six) months. Administer in upper arm, thigh, or abdomen    . diltiazem (CARDIZEM CD) 180 MG 24 hr capsule TAKE 1 CAPSULE DAILY. 90 capsule 3  . DULoxetine (CYMBALTA) 60 MG  capsule Take 60 mg by mouth daily.     Marland Kitchen ELIQUIS 2.5 MG TABS tablet TAKE 1 TABLET BY MOUTH TWICE DAILY. 60 tablet 5  . esomeprazole (NEXIUM) 20 MG capsule Take 20 mg by mouth daily at 12 noon.    . feeding supplement (BOOST HIGH PROTEIN) LIQD Take 237 mLs by mouth 3 (three) times daily between meals. Prefers chocolate diabetic and peanut butter crackers before bedtime.    . Flaxseed, Linseed, (FLAXSEED OIL) 1000 MG CAPS Take 1,000 mg by mouth 2 (two) times daily.     . furosemide (LASIX) 20 MG tablet Take 1 tablet (20 mg total) by mouth as needed for edema. 30 tablet 5  . gabapentin (NEURONTIN) 300 MG capsule TAKE 1 CAPSULE AT NIGHT FOR 7 NIGHTS, THEN 1 CAP TWICE DAILY X7 NIGHTS, THEN 1 CAP 3 TIMES DAILY. 90 capsule 0  . levothyroxine (SYNTHROID, LEVOTHROID) 150 MCG tablet Take 150 mcg by mouth daily before breakfast.    . methocarbamol (ROBAXIN) 500 MG tablet Take 1 tablet (500 mg total) by mouth every 8 (eight) hours as needed for muscle spasms. 15 tablet 0  . metoprolol (LOPRESSOR) 50 MG tablet Take 50 mg by mouth 2 (two) times daily.    . Multiple Vitamins-Minerals (MULTIVITAMIN) tablet Take 1 tablet by mouth 2 (two) times daily.     Marland Kitchen  ondansetron (ZOFRAN) 4 MG tablet Take 4 mg by mouth every 8 (eight) hours as needed for nausea or vomiting.    . polyethylene glycol (MIRALAX / GLYCOLAX) packet Take 17 g by mouth daily. 14 each 0  . potassium chloride (K-DUR) 10 MEQ tablet Take 10 mEq by mouth. Take one tablet as needed when giving lasix as needed    . saccharomyces boulardii (FLORASTOR) 250 MG capsule Take 250 mg by mouth 2 (two) times daily.    Marland Kitchen senna-docusate (SENOKOT S) 8.6-50 MG tablet Take 1 tablet by mouth at bedtime as needed. 30 tablet 1  . sulfamethoxazole-trimethoprim (BACTRIM,SEPTRA) 400-80 MG tablet TAKE 1 TABLET BY MOUTH TWICE DAILY. 60 tablet 2  . sulfamethoxazole-trimethoprim (BACTRIM,SEPTRA) 400-80 MG tablet Take 1 tablet by mouth 2 (two) times daily. 60 tablet 11  . temazepam  (RESTORIL) 30 MG capsule Take 1 capsule (30 mg total) by mouth at bedtime as needed for sleep. 2 capsule 0  . chlorthalidone (HYGROTON) 25 MG tablet Take 1 tablet (25 mg total) by mouth daily. 30 tablet 6   No current facility-administered medications for this visit.     Allergies  Allergen Reactions  . Levaquin [Levofloxacin] Other (See Comments)    tendinopathy  . Morphine And Related Other (See Comments)    Made pt "wild"    Past Medical History:  Diagnosis Date  . Anxiety attack    "take RX prn" (08/13/2013)  . Arthritis    "probably" (08/13/2013)  . Atrial fibrillation ([place])    new onset/pt report 08/13/2013  . Bronchitis 12/2011  . Complication of anesthesia   . Depression   . Diabetes mellitus type 2, diet-controlled ([place])    denies  . Femur fracture (HCC)    left proximal femur non-union  . GERD (gastroesophageal reflux disease)   . H/O hiatal hernia   . Hardware complicating wound infection ([place]) 08/22/2016  . History of blood transfusion    "w/both knee OR's" (08/13/2013)  . Hyperlipidemia   . Hyperlipidemia   . Hypertension   . Hypertensive urgency 10/22/2018  . Hypothyroidism    takes synthroid  . Insomnia   . Intertrochanteric fracture of right hip ([place]) 07/30/2015  . Neuromuscular disorder ([place])    carpal tunnel in right hand  . Periprosthetic fracture around internal prosthetic right hip joint ([place]), nonunion basicervical femoral neck with broken hardware and subtrochanteric fracture 03/16/2016  . PONV (postoperative nausea and vomiting)   . Prosthetic hip infection ([place]) 08/22/2016  . Proteus infection 08/22/2016  . Serratia infection 11/14/2016  . Skin cancer of face 2000's   "one on each side of my face and one on my nose" (08/13/2013)  . Swelling of extremity    sees Dr. Gwenlyn Found, cardiologist 1 x year [phone]  . Tendon dysfunction 08/22/2016  . Wears hearing aid    HOH    Social History   Socioeconomic History  . Marital status: Widowed    Spouse name:  Not on file  . Number of children: Not on file  . Years of education: Not on file  . Highest education level: Not on file  Occupational History  . Not on file  Social Needs  . Financial resource strain: Not on file  . Food insecurity:    Worry: Not on file    Inability: Not on file  . Transportation needs:    Medical: Not on file    Non-medical: Not on file  Tobacco Use  . Smoking status: Never Smoker  .  Smokeless tobacco: Never Used  Substance and Sexual Activity  . Alcohol use: No  . Drug use: No  . Sexual activity: Never  Lifestyle  . Physical activity:    Days per week: Not on file    Minutes per session: Not on file  . Stress: Not on file  Relationships  . Social connections:    Talks on phone: Not on file    Gets together: Not on file    Attends religious service: Not on file    Active member of club or organization: Not on file    Attends meetings of clubs or organizations: Not on file    Relationship status: Not on file  . Intimate partner violence:    Fear of current or ex partner: Not on file    Emotionally abused: Not on file    Physically abused: Not on file    Forced sexual activity: Not on file  Other Topics Concern  . Not on file  Social History Narrative   Widowed. Lives at [facility]. Son died in the [place] War and she has not slept well since then.   Admitted to [facility] 01/30/17   Never smoked   Alcohol none   Full Code     Family History  Problem Relation Age of Onset  . Arrhythmia Mother   . Heart disease Mother   . Arrhythmia Sister   . Stroke Father 76  . Heart disease Brother   . Heart disease Brother   . Cancer Brother        Colon cancer  . Arrhythmia Brother   . Heart disease Brother   . Heart disease Sister   . Heart disease Sister   . Stroke Sister   . Diabetes Sister   . Heart disease Child 38  . Heart disease Child 43     Review of Systems: General: negative for chills, fever, night sweats or  weight changes.  Cardiovascular: negative for chest pain, dyspnea on exertion, edema, orthopnea, palpitations, paroxysmal nocturnal dyspnea or shortness of breath Dermatological: negative for rash Respiratory: negative for cough or wheezing Urologic: negative for hematuria Abdominal: negative for nausea, vomiting, diarrhea, bright red blood per rectum, melena, or hematemesis Neurologic: negative for visual changes, syncope, or dizziness All other systems reviewed and are otherwise negative except as noted above.    Blood pressure (!) 144/98, pulse 86, height 5\' 3"  (1.6 m), weight 135 lb (61.2 kg), SpO2 98 %.  General appearance: alert, cooperative and no distress Lungs: clear to auscultation bilaterally Heart: irregularly irregular rhythm Neurologic: Grossly normal  EKG AF with VR 86  ASSESSMENT AND PLAN:   Paroxysmal atrial fibrillation (HCC) H/O PAF in 2014- no recurrence   Hypertensive urgency Increased B/P today in the office  Chronic anticoagulation Low dose Eliquis- (61 kg, normal renal function, 84 y/o).   Prosthetic hip infection [facility]) Being followed by Dr Tommy Medal- on ABs   PLAN  I stopped her Lisinopril and had her start Chlorthalidone 25 mg. She'll have a BMP in a week and track her B/P at home.   Technically she should be on full dose Eliquis and there is no indication for ASA 81 mg. I'll stop the ASA but will defer Eliquis dosing question to Dr Gwenlyn Found.   She is also on Lipitor 80 mg, last lipid panel was 2014, they questioned wether or not she needed to continue this.   Kerin Ransom PA-C 11/13/2018 12:04 PM

## 2018-11-13 NOTE — Telephone Encounter (Signed)
Patient was seen in the office today

## 2018-11-13 NOTE — Assessment & Plan Note (Signed)
Low dose Eliquis- (61 kg, normal renal function, 84 y/o).

## 2018-11-13 NOTE — Patient Instructions (Addendum)
Medication Instructions:  STOP Aspirin  STOP Lisinopril START [place] 25mg  Take 1 tablet once a day If you need a refill on your cardiac medications before your next appointment, please call your pharmacy.   Lab work: Your physician recommends that you return for lab work in: 1 week BMET If you have labs (blood work) drawn today and your tests are completely normal, you will receive your results only by: Marland Kitchen MyChart Message (if you have MyChart) OR . A paper copy in the mail If you have any lab test that is abnormal or we need to change your treatment, we will call you to review the results.  Testing/Procedures: NONE   Follow-Up: At [facility], you and your health needs are our priority.  As part of our continuing mission to provide you with exceptional heart care, we have created designated Provider Care Teams.  These Care Teams include your primary Cardiologist (physician) and Advanced Practice Providers (APPs -  Physician Assistants and Nurse Practitioners) who all work together to provide you with the care you need, when you need it. . Your physician recommends that you schedule a follow-up appointment in: [place].  Any Other Special Instructions Will Be Listed Below (If Applicable). CHECK YOUR BLOOD PRESSURE 3 TIMES A WEEK AT DIFFERENT TIMES CONTACT OFFICE IF YOUR BLOOD PRESSURE READINGS

## 2018-11-13 NOTE — Assessment & Plan Note (Signed)
H/O PAF in 2014- no recurrence

## 2018-11-13 NOTE — Assessment & Plan Note (Signed)
Increased B/P today in the office

## 2018-11-13 NOTE — Assessment & Plan Note (Signed)
Being followed by Dr Tommy Medal- on ABs

## 2018-11-14 ENCOUNTER — Telehealth: Payer: Self-pay

## 2018-11-14 DIAGNOSIS — M6281 Muscle weakness (generalized): Secondary | ICD-10-CM | POA: Diagnosis not present

## 2018-11-14 DIAGNOSIS — M25551 Pain in right hip: Secondary | ICD-10-CM | POA: Diagnosis not present

## 2018-11-14 DIAGNOSIS — M79605 Pain in left leg: Secondary | ICD-10-CM | POA: Diagnosis not present

## 2018-11-14 DIAGNOSIS — R2681 Unsteadiness on feet: Secondary | ICD-10-CM | POA: Diagnosis not present

## 2018-11-14 DIAGNOSIS — M545 Low back pain: Secondary | ICD-10-CM | POA: Diagnosis not present

## 2018-11-14 DIAGNOSIS — R29898 Other symptoms and signs involving the musculoskeletal system: Secondary | ICD-10-CM | POA: Diagnosis not present

## 2018-11-14 NOTE — Telephone Encounter (Signed)
Called patient, LVM to call back to discuss medication changes.  Left call back number.

## 2018-11-14 NOTE — Telephone Encounter (Signed)
Patrick Jupiter patient's son returned call.

## 2018-11-14 NOTE — Telephone Encounter (Signed)
-----   Message from Erlene Quan, [place] sent at 11/14/2018  7:49 AM EST ----- T can you let [name] know she can stop her Lipitor per Dr Dellia Beckwith PA-C 11/14/2018 7:49 AM

## 2018-11-14 NOTE — Telephone Encounter (Signed)
Called son, advised to stop Lipitor.  Son verbalized understanding.  No questions or concerns.

## 2018-11-15 DIAGNOSIS — M545 Low back pain: Secondary | ICD-10-CM | POA: Diagnosis not present

## 2018-11-15 DIAGNOSIS — M25551 Pain in right hip: Secondary | ICD-10-CM | POA: Diagnosis not present

## 2018-11-15 DIAGNOSIS — R2681 Unsteadiness on feet: Secondary | ICD-10-CM | POA: Diagnosis not present

## 2018-11-15 DIAGNOSIS — M6281 Muscle weakness (generalized): Secondary | ICD-10-CM | POA: Diagnosis not present

## 2018-11-15 DIAGNOSIS — M79605 Pain in left leg: Secondary | ICD-10-CM | POA: Diagnosis not present

## 2018-11-15 DIAGNOSIS — R29898 Other symptoms and signs involving the musculoskeletal system: Secondary | ICD-10-CM | POA: Diagnosis not present

## 2018-11-19 DIAGNOSIS — M79605 Pain in left leg: Secondary | ICD-10-CM | POA: Diagnosis not present

## 2018-11-19 DIAGNOSIS — M25551 Pain in right hip: Secondary | ICD-10-CM | POA: Diagnosis not present

## 2018-11-19 DIAGNOSIS — R2681 Unsteadiness on feet: Secondary | ICD-10-CM | POA: Diagnosis not present

## 2018-11-19 DIAGNOSIS — R29898 Other symptoms and signs involving the musculoskeletal system: Secondary | ICD-10-CM | POA: Diagnosis not present

## 2018-11-19 DIAGNOSIS — M545 Low back pain: Secondary | ICD-10-CM | POA: Diagnosis not present

## 2018-11-19 DIAGNOSIS — M6281 Muscle weakness (generalized): Secondary | ICD-10-CM | POA: Diagnosis not present

## 2018-11-20 DIAGNOSIS — R2681 Unsteadiness on feet: Secondary | ICD-10-CM | POA: Diagnosis not present

## 2018-11-20 DIAGNOSIS — M79605 Pain in left leg: Secondary | ICD-10-CM | POA: Diagnosis not present

## 2018-11-20 DIAGNOSIS — R29898 Other symptoms and signs involving the musculoskeletal system: Secondary | ICD-10-CM | POA: Diagnosis not present

## 2018-11-20 DIAGNOSIS — I160 Hypertensive urgency: Secondary | ICD-10-CM | POA: Diagnosis not present

## 2018-11-20 DIAGNOSIS — Z96649 Presence of unspecified artificial hip joint: Secondary | ICD-10-CM | POA: Diagnosis not present

## 2018-11-20 DIAGNOSIS — M6281 Muscle weakness (generalized): Secondary | ICD-10-CM | POA: Diagnosis not present

## 2018-11-20 DIAGNOSIS — I480 Paroxysmal atrial fibrillation: Secondary | ICD-10-CM | POA: Diagnosis not present

## 2018-11-20 DIAGNOSIS — T8459XS Infection and inflammatory reaction due to other internal joint prosthesis, sequela: Secondary | ICD-10-CM | POA: Diagnosis not present

## 2018-11-20 DIAGNOSIS — Z7901 Long term (current) use of anticoagulants: Secondary | ICD-10-CM | POA: Diagnosis not present

## 2018-11-20 DIAGNOSIS — M545 Low back pain: Secondary | ICD-10-CM | POA: Diagnosis not present

## 2018-11-20 DIAGNOSIS — M25551 Pain in right hip: Secondary | ICD-10-CM | POA: Diagnosis not present

## 2018-11-20 LAB — BASIC METABOLIC PANEL
BUN/Creatinine Ratio: 18 (ref 12–28)
BUN: 17 mg/dL (ref 10–36)
CO2: 25 mmol/L (ref 20–29)
Calcium: 10.8 mg/dL — ABNORMAL HIGH (ref 8.7–10.3)
Chloride: 95 mmol/L — ABNORMAL LOW (ref 96–106)
Creatinine, Ser: 0.94 mg/dL (ref 0.57–1.00)
GFR calc Af Amer: 60 mL/min/{1.73_m2} (ref >59)
GFR calc non Af Amer: 52 mL/min/{1.73_m2} — ABNORMAL LOW (ref >59)
Glucose: 95 mg/dL (ref 65–99)
Potassium: 4.3 mmol/L (ref 3.5–5.2)
Sodium: 137 mmol/L (ref 134–144)

## 2018-11-22 DIAGNOSIS — M6281 Muscle weakness (generalized): Secondary | ICD-10-CM | POA: Diagnosis not present

## 2018-11-22 DIAGNOSIS — R2681 Unsteadiness on feet: Secondary | ICD-10-CM | POA: Diagnosis not present

## 2018-11-22 DIAGNOSIS — M545 Low back pain: Secondary | ICD-10-CM | POA: Diagnosis not present

## 2018-11-22 DIAGNOSIS — M79605 Pain in left leg: Secondary | ICD-10-CM | POA: Diagnosis not present

## 2018-11-22 DIAGNOSIS — M25551 Pain in right hip: Secondary | ICD-10-CM | POA: Diagnosis not present

## 2018-11-22 DIAGNOSIS — R29898 Other symptoms and signs involving the musculoskeletal system: Secondary | ICD-10-CM | POA: Diagnosis not present

## 2018-11-26 ENCOUNTER — Telehealth: Payer: Self-pay | Admitting: Cardiovascular Disease

## 2018-11-26 DIAGNOSIS — M6281 Muscle weakness (generalized): Secondary | ICD-10-CM | POA: Diagnosis not present

## 2018-11-26 DIAGNOSIS — R29898 Other symptoms and signs involving the musculoskeletal system: Secondary | ICD-10-CM | POA: Diagnosis not present

## 2018-11-26 DIAGNOSIS — M25551 Pain in right hip: Secondary | ICD-10-CM | POA: Diagnosis not present

## 2018-11-26 DIAGNOSIS — R2681 Unsteadiness on feet: Secondary | ICD-10-CM | POA: Diagnosis not present

## 2018-11-26 DIAGNOSIS — M79605 Pain in left leg: Secondary | ICD-10-CM | POA: Diagnosis not present

## 2018-11-26 DIAGNOSIS — M545 Low back pain: Secondary | ICD-10-CM | POA: Diagnosis not present

## 2018-11-26 NOTE — Telephone Encounter (Signed)
New message  Per patient is calling in b/p readings:  11/20/18 8 am 135/94  11/23/18 4:30 pm  120/67  11/24/18 8:30pm 134/78

## 2018-11-26 NOTE — Telephone Encounter (Signed)
Message sent to [facility] PA for review.

## 2018-11-27 NOTE — Telephone Encounter (Signed)
B/P looks OK. Continue current medical treatment.  Kerin Ransom PA-C 11/27/2018 7:56 AM

## 2018-11-28 DIAGNOSIS — M79605 Pain in left leg: Secondary | ICD-10-CM | POA: Diagnosis not present

## 2018-11-28 DIAGNOSIS — R29898 Other symptoms and signs involving the musculoskeletal system: Secondary | ICD-10-CM | POA: Diagnosis not present

## 2018-11-28 DIAGNOSIS — M6281 Muscle weakness (generalized): Secondary | ICD-10-CM | POA: Diagnosis not present

## 2018-11-28 DIAGNOSIS — M25551 Pain in right hip: Secondary | ICD-10-CM | POA: Diagnosis not present

## 2018-11-28 DIAGNOSIS — R2681 Unsteadiness on feet: Secondary | ICD-10-CM | POA: Diagnosis not present

## 2018-11-28 DIAGNOSIS — M545 Low back pain: Secondary | ICD-10-CM | POA: Diagnosis not present

## 2018-11-28 NOTE — Telephone Encounter (Signed)
Received call from patient Leah Holland's advice given.

## 2018-11-28 NOTE — Telephone Encounter (Signed)
Returned call to patient no answer.LMTC. 

## 2018-11-29 DIAGNOSIS — M79605 Pain in left leg: Secondary | ICD-10-CM | POA: Diagnosis not present

## 2018-11-29 DIAGNOSIS — R2681 Unsteadiness on feet: Secondary | ICD-10-CM | POA: Diagnosis not present

## 2018-11-29 DIAGNOSIS — M25551 Pain in right hip: Secondary | ICD-10-CM | POA: Diagnosis not present

## 2018-11-29 DIAGNOSIS — R29898 Other symptoms and signs involving the musculoskeletal system: Secondary | ICD-10-CM | POA: Diagnosis not present

## 2018-11-29 DIAGNOSIS — M6281 Muscle weakness (generalized): Secondary | ICD-10-CM | POA: Diagnosis not present

## 2018-11-29 DIAGNOSIS — M545 Low back pain: Secondary | ICD-10-CM | POA: Diagnosis not present

## 2018-12-03 DIAGNOSIS — M25551 Pain in right hip: Secondary | ICD-10-CM | POA: Diagnosis not present

## 2018-12-03 DIAGNOSIS — R29898 Other symptoms and signs involving the musculoskeletal system: Secondary | ICD-10-CM | POA: Diagnosis not present

## 2018-12-03 DIAGNOSIS — R2681 Unsteadiness on feet: Secondary | ICD-10-CM | POA: Diagnosis not present

## 2018-12-03 DIAGNOSIS — M79605 Pain in left leg: Secondary | ICD-10-CM | POA: Diagnosis not present

## 2018-12-03 DIAGNOSIS — M545 Low back pain: Secondary | ICD-10-CM | POA: Diagnosis not present

## 2018-12-03 DIAGNOSIS — M6281 Muscle weakness (generalized): Secondary | ICD-10-CM | POA: Diagnosis not present

## 2018-12-04 DIAGNOSIS — M545 Low back pain: Secondary | ICD-10-CM | POA: Diagnosis not present

## 2018-12-04 DIAGNOSIS — M79605 Pain in left leg: Secondary | ICD-10-CM | POA: Diagnosis not present

## 2018-12-04 DIAGNOSIS — M25551 Pain in right hip: Secondary | ICD-10-CM | POA: Diagnosis not present

## 2018-12-04 DIAGNOSIS — M6281 Muscle weakness (generalized): Secondary | ICD-10-CM | POA: Diagnosis not present

## 2018-12-04 DIAGNOSIS — R29898 Other symptoms and signs involving the musculoskeletal system: Secondary | ICD-10-CM | POA: Diagnosis not present

## 2018-12-04 DIAGNOSIS — R2681 Unsteadiness on feet: Secondary | ICD-10-CM | POA: Diagnosis not present

## 2018-12-06 DIAGNOSIS — M25551 Pain in right hip: Secondary | ICD-10-CM | POA: Diagnosis not present

## 2018-12-06 DIAGNOSIS — M545 Low back pain: Secondary | ICD-10-CM | POA: Diagnosis not present

## 2018-12-06 DIAGNOSIS — M6281 Muscle weakness (generalized): Secondary | ICD-10-CM | POA: Diagnosis not present

## 2018-12-06 DIAGNOSIS — R29898 Other symptoms and signs involving the musculoskeletal system: Secondary | ICD-10-CM | POA: Diagnosis not present

## 2018-12-06 DIAGNOSIS — M79605 Pain in left leg: Secondary | ICD-10-CM | POA: Diagnosis not present

## 2018-12-06 DIAGNOSIS — R2681 Unsteadiness on feet: Secondary | ICD-10-CM | POA: Diagnosis not present

## 2018-12-10 DIAGNOSIS — R2681 Unsteadiness on feet: Secondary | ICD-10-CM | POA: Diagnosis not present

## 2018-12-10 DIAGNOSIS — R29898 Other symptoms and signs involving the musculoskeletal system: Secondary | ICD-10-CM | POA: Diagnosis not present

## 2018-12-10 DIAGNOSIS — M6281 Muscle weakness (generalized): Secondary | ICD-10-CM | POA: Diagnosis not present

## 2018-12-10 DIAGNOSIS — M25551 Pain in right hip: Secondary | ICD-10-CM | POA: Diagnosis not present

## 2018-12-10 DIAGNOSIS — M79605 Pain in left leg: Secondary | ICD-10-CM | POA: Diagnosis not present

## 2018-12-10 DIAGNOSIS — M545 Low back pain: Secondary | ICD-10-CM | POA: Diagnosis not present

## 2018-12-12 DIAGNOSIS — E1300 Other specified diabetes mellitus with hyperosmolarity without nonketotic hyperglycemic-hyperosmolar coma (NKHHC): Secondary | ICD-10-CM | POA: Diagnosis not present

## 2018-12-12 DIAGNOSIS — M79605 Pain in left leg: Secondary | ICD-10-CM | POA: Diagnosis not present

## 2018-12-12 DIAGNOSIS — R2681 Unsteadiness on feet: Secondary | ICD-10-CM | POA: Diagnosis not present

## 2018-12-12 DIAGNOSIS — R29898 Other symptoms and signs involving the musculoskeletal system: Secondary | ICD-10-CM | POA: Diagnosis not present

## 2018-12-12 DIAGNOSIS — M545 Low back pain: Secondary | ICD-10-CM | POA: Diagnosis not present

## 2018-12-12 DIAGNOSIS — M6281 Muscle weakness (generalized): Secondary | ICD-10-CM | POA: Diagnosis not present

## 2018-12-12 DIAGNOSIS — Z4789 Encounter for other orthopedic aftercare: Secondary | ICD-10-CM | POA: Diagnosis not present

## 2018-12-14 DIAGNOSIS — J209 Acute bronchitis, unspecified: Secondary | ICD-10-CM | POA: Diagnosis not present

## 2018-12-17 DIAGNOSIS — Z4789 Encounter for other orthopedic aftercare: Secondary | ICD-10-CM | POA: Diagnosis not present

## 2018-12-17 DIAGNOSIS — R2681 Unsteadiness on feet: Secondary | ICD-10-CM | POA: Diagnosis not present

## 2018-12-17 DIAGNOSIS — M79605 Pain in left leg: Secondary | ICD-10-CM | POA: Diagnosis not present

## 2018-12-17 DIAGNOSIS — M6281 Muscle weakness (generalized): Secondary | ICD-10-CM | POA: Diagnosis not present

## 2018-12-17 DIAGNOSIS — R29898 Other symptoms and signs involving the musculoskeletal system: Secondary | ICD-10-CM | POA: Diagnosis not present

## 2018-12-17 DIAGNOSIS — M545 Low back pain: Secondary | ICD-10-CM | POA: Diagnosis not present

## 2018-12-19 DIAGNOSIS — M6281 Muscle weakness (generalized): Secondary | ICD-10-CM | POA: Diagnosis not present

## 2018-12-19 DIAGNOSIS — R2681 Unsteadiness on feet: Secondary | ICD-10-CM | POA: Diagnosis not present

## 2018-12-19 DIAGNOSIS — M79605 Pain in left leg: Secondary | ICD-10-CM | POA: Diagnosis not present

## 2018-12-19 DIAGNOSIS — R29898 Other symptoms and signs involving the musculoskeletal system: Secondary | ICD-10-CM | POA: Diagnosis not present

## 2018-12-19 DIAGNOSIS — M545 Low back pain: Secondary | ICD-10-CM | POA: Diagnosis not present

## 2018-12-19 DIAGNOSIS — Z4789 Encounter for other orthopedic aftercare: Secondary | ICD-10-CM | POA: Diagnosis not present

## 2018-12-20 DIAGNOSIS — M6281 Muscle weakness (generalized): Secondary | ICD-10-CM | POA: Diagnosis not present

## 2018-12-20 DIAGNOSIS — Z4789 Encounter for other orthopedic aftercare: Secondary | ICD-10-CM | POA: Diagnosis not present

## 2018-12-20 DIAGNOSIS — M79605 Pain in left leg: Secondary | ICD-10-CM | POA: Diagnosis not present

## 2018-12-20 DIAGNOSIS — M545 Low back pain: Secondary | ICD-10-CM | POA: Diagnosis not present

## 2018-12-20 DIAGNOSIS — R2681 Unsteadiness on feet: Secondary | ICD-10-CM | POA: Diagnosis not present

## 2018-12-20 DIAGNOSIS — R29898 Other symptoms and signs involving the musculoskeletal system: Secondary | ICD-10-CM | POA: Diagnosis not present

## 2018-12-27 ENCOUNTER — Telehealth (INDEPENDENT_AMBULATORY_CARE_PROVIDER_SITE_OTHER): Payer: Self-pay | Admitting: Physical Medicine and Rehabilitation

## 2018-12-27 NOTE — Telephone Encounter (Signed)
Left message for patient's son to advise.

## 2018-12-27 NOTE — Telephone Encounter (Signed)
It is safe to continue and if they feel it is helping I would not stop.

## 2018-12-28 ENCOUNTER — Other Ambulatory Visit (INDEPENDENT_AMBULATORY_CARE_PROVIDER_SITE_OTHER): Payer: Self-pay | Admitting: Physical Medicine and Rehabilitation

## 2018-12-28 NOTE — Telephone Encounter (Signed)
Please advise 

## 2019-01-03 DIAGNOSIS — R748 Abnormal levels of other serum enzymes: Secondary | ICD-10-CM | POA: Diagnosis not present

## 2019-01-03 DIAGNOSIS — I4891 Unspecified atrial fibrillation: Secondary | ICD-10-CM | POA: Diagnosis not present

## 2019-01-03 DIAGNOSIS — E785 Hyperlipidemia, unspecified: Secondary | ICD-10-CM | POA: Diagnosis not present

## 2019-01-03 DIAGNOSIS — G4700 Insomnia, unspecified: Secondary | ICD-10-CM | POA: Diagnosis not present

## 2019-01-03 DIAGNOSIS — I10 Essential (primary) hypertension: Secondary | ICD-10-CM | POA: Diagnosis not present

## 2019-01-03 DIAGNOSIS — H900 Conductive hearing loss, bilateral: Secondary | ICD-10-CM | POA: Diagnosis not present

## 2019-01-28 ENCOUNTER — Other Ambulatory Visit (INDEPENDENT_AMBULATORY_CARE_PROVIDER_SITE_OTHER): Payer: Self-pay | Admitting: Physical Medicine and Rehabilitation

## 2019-01-28 NOTE — Telephone Encounter (Signed)
Please advise 

## 2019-01-31 DIAGNOSIS — M810 Age-related osteoporosis without current pathological fracture: Secondary | ICD-10-CM | POA: Diagnosis not present

## 2019-01-31 DIAGNOSIS — E559 Vitamin D deficiency, unspecified: Secondary | ICD-10-CM | POA: Diagnosis not present

## 2019-01-31 DIAGNOSIS — R5383 Other fatigue: Secondary | ICD-10-CM | POA: Diagnosis not present

## 2019-02-05 DIAGNOSIS — M810 Age-related osteoporosis without current pathological fracture: Secondary | ICD-10-CM | POA: Diagnosis not present

## 2019-02-05 DIAGNOSIS — E559 Vitamin D deficiency, unspecified: Secondary | ICD-10-CM | POA: Diagnosis not present

## 2019-02-12 ENCOUNTER — Ambulatory Visit (INDEPENDENT_AMBULATORY_CARE_PROVIDER_SITE_OTHER): Payer: Medicare Other | Admitting: Cardiovascular Disease

## 2019-02-12 ENCOUNTER — Encounter: Payer: Self-pay | Admitting: Cardiovascular Disease

## 2019-02-12 ENCOUNTER — Other Ambulatory Visit: Payer: Self-pay

## 2019-02-12 DIAGNOSIS — E7800 Pure hypercholesterolemia, unspecified: Secondary | ICD-10-CM

## 2019-02-12 DIAGNOSIS — I10 Essential (primary) hypertension: Secondary | ICD-10-CM | POA: Diagnosis not present

## 2019-02-12 DIAGNOSIS — I480 Paroxysmal atrial fibrillation: Secondary | ICD-10-CM | POA: Diagnosis not present

## 2019-02-12 MED ORDER — FUROSEMIDE 20 MG PO TABS: 20.0000 mg | ORAL_TABLET | ORAL | 5 refills | Status: DC | PRN

## 2019-02-12 MED ORDER — POTASSIUM CHLORIDE ER 10 MEQ PO TBCR: 10.0000 meq | EXTENDED_RELEASE_TABLET | ORAL | 3 refills | Status: DC | PRN

## 2019-02-12 NOTE — Assessment & Plan Note (Signed)
History of PAF currently in A. fib on low-dose Eliquis.  She is asymptomatic from this.

## 2019-02-12 NOTE — Assessment & Plan Note (Signed)
History of hyperlipidemia on statin therapy with lipid profile performed 06/29/2018 revealing total cholesterol 100, LDL 47 and HDL 37.

## 2019-02-12 NOTE — Progress Notes (Signed)
02/12/2019 JONE PANEBIANCO   1922-01-28  [id number]  Primary Physician [name], MD Primary Cardiologist: Lorretta Harp MD [place], [place], [place]  HPI:  ORELLA CUSHMAN is a 85 y.o.  widowed, Caucasian female, mother of two living children (one deceased), grandmother to five grandchildren, who I last saw  03/02/2018. She is the youngest of 10 children. Her sister, [name] was also placed in mind that has passed away. She had one son who died , one son who is now a retired Lexicographer , who accompanies her today, in one son who was a Biomedical scientist in [employer], , is currently retired and who is accompanying her today.. She has a history of hypertension and hyperlipidemia, as well as family history of heart disease. She had a remote right total knee replacement and a recent left one by Dr. French Ana. She had a Myoview stress test performed in our office on August 07, 2011, which was low risk. Recent lab work performed by Dr. Greta Doom revealed a total cholesterol of 133, HDL of 71, and HDL of 39.  Patient presented with apparent new onset atrial fibrillation with RVR. She was [facility] on 08/03/2013 for chest pain. She was diagnosed with gastroesophageal reflux disease and a UTI. At that time she was in normal sinus rhythm at a rate of 79 beats per minute and with PACs. She reported dizziness, feeling tired and a racing HR since ~Sat/Sun. She otherwise denied nausea, vomiting, fever, chest pain, dizziness, PND, cough, congestion, abdominal pain, hematochezia, melena, lower extremity edema, claudication. No recent GIB.  She was admitted and started on IV diltiazem 5mg /hr and Eliquis 2.5mg  twice daily. She converted to NSR at 0246hrs. She was changed to PO Cardizem 120mg  daily. She's been taking lasix 60mg  daily for LEE for a long time. This was changed to 20mg  daily as needed a long with 94meq as needed potassium.  Her major issues had been with  recurrent right and left hip fractures requiring surgical intervention.  She has recuperated over the last year and is currently walking with a walker.  She denies chest pain or shortness of breath.   Current Meds  Medication Sig  . acetaminophen (TYLENOL) 325 MG tablet Take 650 mg by mouth every 6 (six) hours as needed for moderate pain.   Marland Kitchen atorvastatin (LIPITOR) 80 MG tablet Take 80 mg by mouth daily.  . calcium carbonate (OS-CAL - DOSED IN MG OF ELEMENTAL CALCIUM) 1250 (500 Ca) MG tablet Take 1 tablet by mouth every evening.  . chlorthalidone (HYGROTON) 25 MG tablet Take 1 tablet (25 mg total) by mouth daily.  . Cholecalciferol 2000 units CAPS Take 2,000 Units by mouth every evening.  . denosumab (PROLIA) 60 MG/ML SOLN injection Inject 60 mg into the skin every 6 (six) months. Administer in upper arm, thigh, or abdomen  . diltiazem (CARDIZEM CD) 180 MG 24 hr capsule TAKE 1 CAPSULE DAILY.  . DULoxetine (CYMBALTA) 60 MG capsule Take 60 mg by mouth daily.   Marland Kitchen ELIQUIS 2.5 MG TABS tablet TAKE 1 TABLET BY MOUTH TWICE DAILY.  Marland Kitchen esomeprazole (NEXIUM) 20 MG capsule Take 20 mg by mouth daily at 12 noon.  . feeding supplement (BOOST HIGH PROTEIN) LIQD Take 237 mLs by mouth 3 (three) times daily between meals. Prefers chocolate diabetic and peanut butter crackers before bedtime.  . Flaxseed, Linseed, (FLAXSEED OIL) 1000 MG CAPS Take 1,000 mg by mouth 2 (two) times  daily.   . furosemide (LASIX) 20 MG tablet Take 1 tablet (20 mg total) by mouth as needed for edema.  . gabapentin (NEURONTIN) 300 MG capsule TAKE (1) CAPSULE THREE TIMES DAILY.  Marland Kitchen levothyroxine (SYNTHROID, LEVOTHROID) 150 MCG tablet Take 150 mcg by mouth daily before breakfast.  . methocarbamol (ROBAXIN) 500 MG tablet Take 1 tablet (500 mg total) by mouth every 8 (eight) hours as needed for muscle spasms.  . metoprolol (LOPRESSOR) 50 MG tablet Take 50 mg by mouth 2 (two) times daily.  . Multiple Vitamins-Minerals (MULTIVITAMIN) tablet Take  1 tablet by mouth 2 (two) times daily.   . ondansetron (ZOFRAN) 4 MG tablet Take 4 mg by mouth every 8 (eight) hours as needed for nausea or vomiting.  . polyethylene glycol (MIRALAX / GLYCOLAX) packet Take 17 g by mouth daily.  . potassium chloride (K-DUR) 10 MEQ tablet Take 10 mEq by mouth. Take one tablet as needed when giving lasix as needed  . saccharomyces boulardii (FLORASTOR) 250 MG capsule Take 250 mg by mouth 2 (two) times daily.  Marland Kitchen senna-docusate (SENOKOT S) 8.6-50 MG tablet Take 1 tablet by mouth at bedtime as needed.  . sulfamethoxazole-trimethoprim (BACTRIM,SEPTRA) 400-80 MG tablet TAKE 1 TABLET BY MOUTH TWICE DAILY.  Marland Kitchen sulfamethoxazole-trimethoprim (BACTRIM,SEPTRA) 400-80 MG tablet Take 1 tablet by mouth 2 (two) times daily.  . temazepam (RESTORIL) 30 MG capsule Take 1 capsule (30 mg total) by mouth at bedtime as needed for sleep.     Allergies  Allergen Reactions  . Levaquin [Levofloxacin] Other (See Comments)    tendinopathy  . Morphine And Related Other (See Comments)    Made pt "wild"    Social History   Socioeconomic History  . Marital status: Widowed    Spouse name: Not on file  . Number of children: Not on file  . Years of education: Not on file  . Highest education level: Not on file  Occupational History  . Not on file  Social Needs  . Financial resource strain: Not on file  . Food insecurity:    Worry: Not on file    Inability: Not on file  . Transportation needs:    Medical: Not on file    Non-medical: Not on file  Tobacco Use  . Smoking status: Never Smoker  . Smokeless tobacco: Never Used  Substance and Sexual Activity  . Alcohol use: No  . Drug use: No  . Sexual activity: Never  Lifestyle  . Physical activity:    Days per week: Not on file    Minutes per session: Not on file  . Stress: Not on file  Relationships  . Social connections:    Talks on phone: Not on file    Gets together: Not on file    Attends religious service: Not on file     Active member of club or organization: Not on file    Attends meetings of clubs or organizations: Not on file    Relationship status: Not on file  . Intimate partner violence:    Fear of current or ex partner: Not on file    Emotionally abused: Not on file    Physically abused: Not on file    Forced sexual activity: Not on file  Other Topics Concern  . Not on file  Social History Narrative   Widowed. Lives at [facility]. Son died in the [place] War and she has not slept well since then.   Admitted to [facility] 01/30/17  Never smoked   Alcohol none   Full Code     Review of Systems: General: negative for chills, fever, night sweats or weight changes.  Cardiovascular: negative for chest pain, dyspnea on exertion, edema, orthopnea, palpitations, paroxysmal nocturnal dyspnea or shortness of breath Dermatological: negative for rash Respiratory: negative for cough or wheezing Urologic: negative for hematuria Abdominal: negative for nausea, vomiting, diarrhea, bright red blood per rectum, melena, or hematemesis Neurologic: negative for visual changes, syncope, or dizziness All other systems reviewed and are otherwise negative except as noted above.    Blood pressure 134/66, pulse 69, height 5\' 3"  (1.6 m), weight 139 lb 12.8 oz (63.4 kg).  General appearance: alert and no distress Neck: no adenopathy, no carotid bruit, no JVD, supple, symmetrical, trachea midline and thyroid not enlarged, symmetric, no tenderness/mass/nodules Lungs: clear to auscultation bilaterally Heart: regular rate and rhythm, S1, S2 normal, no murmur, click, rub or gallop Extremities: extremities normal, atraumatic, no cyanosis or edema Pulses: 2+ and symmetric Skin: Skin color, texture, turgor normal. No rashes or lesions Neurologic: Alert and oriented X 3, normal strength and tone. Normal symmetric reflexes. Normal coordination and gait  EKG not performed today  ASSESSMENT AND PLAN:    Hypertension History of essential hypertension her blood pressure measured today at 134/66.  She was recently changed to chlorthalidone by Kerin Ransom, PA-C which improved her blood pressure control.  Hyperlipidemia History of hyperlipidemia on statin therapy with lipid profile performed 06/29/2018 revealing total cholesterol 100, LDL 47 and HDL 37.  Paroxysmal atrial fibrillation (HCC) History of PAF currently in A. fib on low-dose Eliquis.  She is asymptomatic from this.      Lorretta Harp MD FACP,FACC,FAHA, [facility] 02/12/2019 11:07 AM

## 2019-02-12 NOTE — Patient Instructions (Signed)
Medication Instructions:  Your physician recommends that you continue on your current medications as directed. Please refer to the Current Medication list given to you today.  If you need a refill on your cardiac medications before your next appointment, please call your pharmacy.   Lab work: NONE If you have labs (blood work) drawn today and your tests are completely normal, you will receive your results only by: Marland Kitchen MyChart Message (if you have MyChart) OR . A paper copy in the mail If you have any lab test that is abnormal or we need to change your treatment, we will call you to review the results.  Testing/Procedures: NONE  Follow-Up: At [facility], you and your health needs are our priority.  As part of our continuing mission to provide you with exceptional heart care, we have created designated Provider Care Teams.  These Care Teams include your primary Cardiologist (physician) and Advanced Practice Providers (APPs -  Physician Assistants and Nurse Practitioners) who all work together to provide you with the care you need, when you need it. . You will need a follow up appointment in 6 months with Kerin Ransom, PA-C and in 12 months with Dr. Gwenlyn Found.  Please call our office 2 months in advance to schedule this appointment.  You may see Dr. Gwenlyn Found or one of the following Advanced Practice Providers on your designated Care Team:   . Kerin Ransom, [place] . Almyra Deforest, PA-C . Fabian Sharp, PA-C . Jory Sims, DNP . Rosaria Ferries, PA-C . Roby Lofts, PA-C . Sande Rives, PA-C

## 2019-02-12 NOTE — Assessment & Plan Note (Signed)
History of essential hypertension her blood pressure measured today at 134/66.  She was recently changed to chlorthalidone by Kerin Ransom, PA-C which improved her blood pressure control.

## 2019-03-01 DIAGNOSIS — H903 Sensorineural hearing loss, bilateral: Secondary | ICD-10-CM | POA: Diagnosis not present

## 2019-03-04 ENCOUNTER — Other Ambulatory Visit: Payer: Self-pay | Admitting: Cardiovascular Disease

## 2019-03-04 ENCOUNTER — Other Ambulatory Visit (INDEPENDENT_AMBULATORY_CARE_PROVIDER_SITE_OTHER): Payer: Self-pay | Admitting: Physical Medicine and Rehabilitation

## 2019-03-04 NOTE — Telephone Encounter (Signed)
Please advise 

## 2019-03-21 ENCOUNTER — Other Ambulatory Visit: Payer: Self-pay | Admitting: Cardiovascular Disease

## 2019-03-21 NOTE — Telephone Encounter (Signed)
Eliquis refilled.  

## 2019-05-15 ENCOUNTER — Other Ambulatory Visit: Payer: Self-pay | Admitting: Cardiology

## 2019-05-27 DIAGNOSIS — R3915 Urgency of urination: Secondary | ICD-10-CM | POA: Diagnosis not present

## 2019-05-27 DIAGNOSIS — R300 Dysuria: Secondary | ICD-10-CM | POA: Diagnosis not present

## 2019-06-20 ENCOUNTER — Other Ambulatory Visit: Payer: Self-pay | Admitting: Cardiovascular Disease

## 2019-06-20 NOTE — Telephone Encounter (Signed)
89yof requesting eliquis 2.5mg , scr 0.94 (11/20/18), wt 63.4kg, lov w/ berry(02/12/19), dx afib  Pt clears for 5mg  will notify pharmd of this so they can evaluate and see if a dose change is needed

## 2019-06-20 NOTE — Telephone Encounter (Signed)
Dr. Gwenlyn Found Patient has been on eliquis 2.5mg  BID. Her weight from march is actually >60kg. Patient weight has always been borderline (very close to 60kg) Would you like to keep her on the 2.5mg  BID or increase?

## 2019-06-25 DIAGNOSIS — L570 Actinic keratosis: Secondary | ICD-10-CM | POA: Diagnosis not present

## 2019-06-25 DIAGNOSIS — L821 Other seborrheic keratosis: Secondary | ICD-10-CM | POA: Diagnosis not present

## 2019-06-25 DIAGNOSIS — L814 Other melanin hyperpigmentation: Secondary | ICD-10-CM | POA: Diagnosis not present

## 2019-06-26 NOTE — Telephone Encounter (Signed)
Reviewed with Dr. Gwenlyn Found.  Pt to stay on 2.5 mg bid.

## 2019-07-02 ENCOUNTER — Other Ambulatory Visit (INDEPENDENT_AMBULATORY_CARE_PROVIDER_SITE_OTHER): Payer: Self-pay | Admitting: Physical Medicine and Rehabilitation

## 2019-07-02 NOTE — Telephone Encounter (Signed)
Please advise 

## 2019-07-04 DIAGNOSIS — H35372 Puckering of macula, left eye: Secondary | ICD-10-CM | POA: Diagnosis not present

## 2019-07-04 DIAGNOSIS — H04123 Dry eye syndrome of bilateral lacrimal glands: Secondary | ICD-10-CM | POA: Diagnosis not present

## 2019-07-04 DIAGNOSIS — H524 Presbyopia: Secondary | ICD-10-CM | POA: Diagnosis not present

## 2019-07-04 DIAGNOSIS — H353132 Nonexudative age-related macular degeneration, bilateral, intermediate dry stage: Secondary | ICD-10-CM | POA: Diagnosis not present

## 2019-07-05 DIAGNOSIS — I10 Essential (primary) hypertension: Secondary | ICD-10-CM | POA: Diagnosis not present

## 2019-07-05 DIAGNOSIS — R7989 Other specified abnormal findings of blood chemistry: Secondary | ICD-10-CM | POA: Diagnosis not present

## 2019-07-05 DIAGNOSIS — H900 Conductive hearing loss, bilateral: Secondary | ICD-10-CM | POA: Diagnosis not present

## 2019-07-05 DIAGNOSIS — I951 Orthostatic hypotension: Secondary | ICD-10-CM | POA: Diagnosis not present

## 2019-07-05 DIAGNOSIS — G4700 Insomnia, unspecified: Secondary | ICD-10-CM | POA: Diagnosis not present

## 2019-07-05 DIAGNOSIS — E039 Hypothyroidism, unspecified: Secondary | ICD-10-CM | POA: Diagnosis not present

## 2019-07-05 DIAGNOSIS — M810 Age-related osteoporosis without current pathological fracture: Secondary | ICD-10-CM | POA: Diagnosis not present

## 2019-07-05 DIAGNOSIS — E785 Hyperlipidemia, unspecified: Secondary | ICD-10-CM | POA: Diagnosis not present

## 2019-07-05 DIAGNOSIS — I4891 Unspecified atrial fibrillation: Secondary | ICD-10-CM | POA: Diagnosis not present

## 2019-07-05 DIAGNOSIS — R2689 Other abnormalities of gait and mobility: Secondary | ICD-10-CM | POA: Diagnosis not present

## 2019-07-05 DIAGNOSIS — R5381 Other malaise: Secondary | ICD-10-CM | POA: Diagnosis not present

## 2019-07-05 DIAGNOSIS — N3000 Acute cystitis without hematuria: Secondary | ICD-10-CM | POA: Diagnosis not present

## 2019-07-12 DIAGNOSIS — R29898 Other symptoms and signs involving the musculoskeletal system: Secondary | ICD-10-CM | POA: Diagnosis not present

## 2019-07-12 DIAGNOSIS — E1300 Other specified diabetes mellitus with hyperosmolarity without nonketotic hyperglycemic-hyperosmolar coma (NKHHC): Secondary | ICD-10-CM | POA: Diagnosis not present

## 2019-07-12 DIAGNOSIS — M6281 Muscle weakness (generalized): Secondary | ICD-10-CM | POA: Diagnosis not present

## 2019-07-12 DIAGNOSIS — R2681 Unsteadiness on feet: Secondary | ICD-10-CM | POA: Diagnosis not present

## 2019-07-12 DIAGNOSIS — Z4789 Encounter for other orthopedic aftercare: Secondary | ICD-10-CM | POA: Diagnosis not present

## 2019-07-12 DIAGNOSIS — Z9181 History of falling: Secondary | ICD-10-CM | POA: Diagnosis not present

## 2019-07-12 DIAGNOSIS — R42 Dizziness and giddiness: Secondary | ICD-10-CM | POA: Diagnosis not present

## 2019-07-15 DIAGNOSIS — Z4789 Encounter for other orthopedic aftercare: Secondary | ICD-10-CM | POA: Diagnosis not present

## 2019-07-15 DIAGNOSIS — I4891 Unspecified atrial fibrillation: Secondary | ICD-10-CM | POA: Diagnosis not present

## 2019-07-15 DIAGNOSIS — F5101 Primary insomnia: Secondary | ICD-10-CM | POA: Diagnosis not present

## 2019-07-15 DIAGNOSIS — K5909 Other constipation: Secondary | ICD-10-CM | POA: Diagnosis not present

## 2019-07-15 DIAGNOSIS — K219 Gastro-esophageal reflux disease without esophagitis: Secondary | ICD-10-CM | POA: Diagnosis not present

## 2019-07-15 DIAGNOSIS — M25551 Pain in right hip: Secondary | ICD-10-CM | POA: Diagnosis not present

## 2019-07-15 DIAGNOSIS — R29898 Other symptoms and signs involving the musculoskeletal system: Secondary | ICD-10-CM | POA: Diagnosis not present

## 2019-07-15 DIAGNOSIS — E1300 Other specified diabetes mellitus with hyperosmolarity without nonketotic hyperglycemic-hyperosmolar coma (NKHHC): Secondary | ICD-10-CM | POA: Diagnosis not present

## 2019-07-15 DIAGNOSIS — M6281 Muscle weakness (generalized): Secondary | ICD-10-CM | POA: Diagnosis not present

## 2019-07-15 DIAGNOSIS — R2681 Unsteadiness on feet: Secondary | ICD-10-CM | POA: Diagnosis not present

## 2019-07-15 DIAGNOSIS — E785 Hyperlipidemia, unspecified: Secondary | ICD-10-CM | POA: Diagnosis not present

## 2019-07-15 DIAGNOSIS — R42 Dizziness and giddiness: Secondary | ICD-10-CM | POA: Diagnosis not present

## 2019-07-15 DIAGNOSIS — F419 Anxiety disorder, unspecified: Secondary | ICD-10-CM | POA: Diagnosis not present

## 2019-07-15 DIAGNOSIS — M1990 Unspecified osteoarthritis, unspecified site: Secondary | ICD-10-CM | POA: Diagnosis not present

## 2019-07-15 DIAGNOSIS — I10 Essential (primary) hypertension: Secondary | ICD-10-CM | POA: Diagnosis not present

## 2019-07-15 DIAGNOSIS — Z9181 History of falling: Secondary | ICD-10-CM | POA: Diagnosis not present

## 2019-07-15 DIAGNOSIS — E039 Hypothyroidism, unspecified: Secondary | ICD-10-CM | POA: Diagnosis not present

## 2019-07-15 DIAGNOSIS — K440 Diaphragmatic hernia with obstruction, without gangrene: Secondary | ICD-10-CM | POA: Diagnosis not present

## 2019-07-15 DIAGNOSIS — M21751 Unequal limb length (acquired), right femur: Secondary | ICD-10-CM | POA: Diagnosis not present

## 2019-07-17 DIAGNOSIS — I4891 Unspecified atrial fibrillation: Secondary | ICD-10-CM | POA: Diagnosis not present

## 2019-07-17 DIAGNOSIS — R2681 Unsteadiness on feet: Secondary | ICD-10-CM | POA: Diagnosis not present

## 2019-07-17 DIAGNOSIS — Z9181 History of falling: Secondary | ICD-10-CM | POA: Diagnosis not present

## 2019-07-17 DIAGNOSIS — M6281 Muscle weakness (generalized): Secondary | ICD-10-CM | POA: Diagnosis not present

## 2019-07-17 DIAGNOSIS — R42 Dizziness and giddiness: Secondary | ICD-10-CM | POA: Diagnosis not present

## 2019-07-17 DIAGNOSIS — F419 Anxiety disorder, unspecified: Secondary | ICD-10-CM | POA: Diagnosis not present

## 2019-07-19 ENCOUNTER — Ambulatory Visit: Payer: Medicare Other | Admitting: Cardiology

## 2019-07-19 DIAGNOSIS — R42 Dizziness and giddiness: Secondary | ICD-10-CM | POA: Diagnosis not present

## 2019-07-19 DIAGNOSIS — Z9181 History of falling: Secondary | ICD-10-CM | POA: Diagnosis not present

## 2019-07-19 DIAGNOSIS — M6281 Muscle weakness (generalized): Secondary | ICD-10-CM | POA: Diagnosis not present

## 2019-07-19 DIAGNOSIS — R7989 Other specified abnormal findings of blood chemistry: Secondary | ICD-10-CM | POA: Diagnosis not present

## 2019-07-19 DIAGNOSIS — R2681 Unsteadiness on feet: Secondary | ICD-10-CM | POA: Diagnosis not present

## 2019-07-19 DIAGNOSIS — F419 Anxiety disorder, unspecified: Secondary | ICD-10-CM | POA: Diagnosis not present

## 2019-07-19 DIAGNOSIS — I4891 Unspecified atrial fibrillation: Secondary | ICD-10-CM | POA: Diagnosis not present

## 2019-07-19 DIAGNOSIS — R946 Abnormal results of thyroid function studies: Secondary | ICD-10-CM | POA: Diagnosis not present

## 2019-07-22 ENCOUNTER — Ambulatory Visit (INDEPENDENT_AMBULATORY_CARE_PROVIDER_SITE_OTHER): Payer: Medicare Other | Admitting: Cardiology

## 2019-07-22 ENCOUNTER — Other Ambulatory Visit: Payer: Self-pay

## 2019-07-22 ENCOUNTER — Encounter: Payer: Self-pay | Admitting: Cardiology

## 2019-07-22 VITALS — BP 162/84 | HR 74 | Temp 97.2°F | Ht 63.0 in | Wt 138.0 lb

## 2019-07-22 DIAGNOSIS — R42 Dizziness and giddiness: Secondary | ICD-10-CM | POA: Diagnosis not present

## 2019-07-22 DIAGNOSIS — F419 Anxiety disorder, unspecified: Secondary | ICD-10-CM | POA: Diagnosis not present

## 2019-07-22 DIAGNOSIS — E119 Type 2 diabetes mellitus without complications: Secondary | ICD-10-CM | POA: Diagnosis not present

## 2019-07-22 DIAGNOSIS — I4891 Unspecified atrial fibrillation: Secondary | ICD-10-CM | POA: Diagnosis not present

## 2019-07-22 DIAGNOSIS — I10 Essential (primary) hypertension: Secondary | ICD-10-CM

## 2019-07-22 DIAGNOSIS — I480 Paroxysmal atrial fibrillation: Secondary | ICD-10-CM

## 2019-07-22 DIAGNOSIS — T8459XS Infection and inflammatory reaction due to other internal joint prosthesis, sequela: Secondary | ICD-10-CM

## 2019-07-22 DIAGNOSIS — Z96649 Presence of unspecified artificial hip joint: Secondary | ICD-10-CM

## 2019-07-22 DIAGNOSIS — Z9181 History of falling: Secondary | ICD-10-CM | POA: Diagnosis not present

## 2019-07-22 DIAGNOSIS — R2681 Unsteadiness on feet: Secondary | ICD-10-CM | POA: Diagnosis not present

## 2019-07-22 DIAGNOSIS — Z7901 Long term (current) use of anticoagulants: Secondary | ICD-10-CM | POA: Diagnosis not present

## 2019-07-22 DIAGNOSIS — M6281 Muscle weakness (generalized): Secondary | ICD-10-CM | POA: Diagnosis not present

## 2019-07-22 NOTE — Progress Notes (Signed)
Cardiology Office Note:    Date:  07/22/2019   ID:  Leah Holland, DOB 04-18-1922, MRN [id number]  PCP:  Leah Shanks, MD  Cardiologist:  Leah Burow, MD  Electrophysiologist:  None   Referring MD: Leah Shanks, MD   No chief complaint on file.   History of Present Illness:    Leah Holland is a delightful  85 y.o. female with a hx of PAF dating back to 2014. She is on low dose Eliquis. In Dec 2019 she was noted to be in AF and it appears she has been since.  She tolerates this well.  She has no history of MI or CAD. Myoview in 2017 was low risk with normal LVF. Other problems include HTN, HLD, and DJD - s/p multiple hip surgeries, the last one in June 2018.  She apparently has chronic hip prosthesis infection and is on antibiotics, followed by Dr Leah Holland.  She is in the office today for a 6 month check.  Her son Leah Holland accompanied her.  The pt tells me she has been doing well though she has had a few falls, none with injury.  She uses a rolling walking and is in PT for balance three times a week.   Past Medical History:  Diagnosis Date  . Anxiety attack    "take RX prn" (08/13/2013)  . Arthritis    "probably" (08/13/2013)  . Atrial fibrillation ([place])    new onset/pt report 08/13/2013  . Bronchitis 12/2011  . Complication of anesthesia   . Depression   . Diabetes mellitus type 2, diet-controlled ([place])    denies  . Femur fracture ([facility])    left proximal femur non-union  . GERD (gastroesophageal reflux disease)   . H/O hiatal hernia   . Hardware complicating wound infection ([place]) 08/22/2016  . History of blood transfusion    "w/both knee OR's" (08/13/2013)  . Hyperlipidemia   . Hyperlipidemia   . Hypertension   . Hypertensive urgency 10/22/2018  . Hypothyroidism    takes synthroid  . Insomnia   . Intertrochanteric fracture of right hip ([place]) 07/30/2015  . Neuromuscular disorder ([place])    carpal tunnel in right hand  . Periprosthetic fracture around internal prosthetic  right hip joint ([place]), nonunion basicervical femoral neck with broken hardware and subtrochanteric fracture 03/16/2016  . PONV (postoperative nausea and vomiting)   . Prosthetic hip infection ([place]) 08/22/2016  . Proteus infection 08/22/2016  . Serratia infection 11/14/2016  . Skin cancer of face 2000's   "one on each side of my face and one on my nose" (08/13/2013)  . Swelling of extremity    sees Dr. Gwenlyn Holland, cardiologist 1 x year [phone]  . Tendon dysfunction 08/22/2016  . Wears hearing aid    [facility]    Past Surgical History:  Procedure Laterality Date  . 2D ECHOCARDIOGRAM  12/07/2004   EF 48%, moderate pulmonary hypertension  . BREAST BIOPSY Left ?1970's  . CARDIOVASCULAR STRESS TEST  12/07/11  . CARDIOVASCULAR STRESS TEST  12/07/2011   LEXISCAN, normal scan, no significant wall abnormalities noted  . CARPAL TUNNEL RELEASE Right ~ 2005  . CATARACT EXTRACTION, BILATERAL Bilateral 2000  . DILATION AND CURETTAGE OF UTERUS  1950's  . DOPPLER ECHOCARDIOGRAPHY  12/07/11  . EYE SURGERY    . FEMUR IM NAIL Right 08/01/2015   Procedure: INTRAMEDULLARY (IM) NAIL FEMORAL;  Surgeon: Leah Bond, MD;  Location: [place];  Service: Orthopedics;  Laterality: Right;  . HARDWARE REMOVAL Right 03/16/2016  Procedure: HARDWARE REMOVAL;  Surgeon: Leah Bond, MD;  Location: [place];  Service: Orthopedics;  Laterality: Right;  . INTRAMEDULLARY (IM) NAIL INTERTROCHANTERIC Left 01/26/2017   Procedure: INTRAMEDULLARY (IM) NAIL INTERTROCHANTRIC;  Surgeon: Leah Koyanagi, MD;  Location: [place];  Service: Orthopedics;  Laterality: Left;  . JOINT REPLACEMENT     bilateral knee replacement  . SKIN CANCER EXCISION  2000's   "off my nose" (08/13/2013)  . TOTAL HIP ARTHROPLASTY Right 03/16/2016   Procedure: TOTAL HIP ARTHROPLASTY;  Surgeon: Leah Bond, MD;  Location: [place];  Service: Orthopedics;  Laterality: Right;  . TOTAL HIP ARTHROPLASTY Left 06/09/2017   Procedure: REMOVE LEFT HIP INTRAMEDULLARY NAIL AND CONVERT TO  LEFT CEMENTED HEMI HIP ARTHROPLASTY;  Surgeon: Leah Koyanagi, MD;  Location: [place];  Service: Orthopedics;  Laterality: Left;  . TOTAL KNEE ARTHROPLASTY Right 2005  . TOTAL KNEE ARTHROPLASTY  03/02/2012   Procedure: TOTAL KNEE ARTHROPLASTY;  Surgeon: Leah Holland., MD;  Location: [place];  Service: Orthopedics;  Laterality: Left;    Current Medications: Current Meds  Medication Sig  . acetaminophen (TYLENOL) 325 MG tablet Take 650 mg by mouth every 6 (six) hours as needed for moderate pain.   . calcium carbonate (OS-CAL - DOSED IN MG OF ELEMENTAL CALCIUM) 1250 (500 Ca) MG tablet Take 1 tablet by mouth every evening.  . chlorthalidone (HYGROTON) 25 MG tablet TAKE 1 TABLET ONCE DAILY.  Marland Kitchen Cholecalciferol 2000 units CAPS Take 2,000 Units by mouth every evening.  . denosumab (PROLIA) 60 MG/ML SOLN injection Inject 60 mg into the skin every 6 (six) months. Administer in upper arm, thigh, or abdomen  . diltiazem (CARDIZEM CD) 180 MG 24 hr capsule TAKE 1 CAPSULE DAILY.  . DULoxetine (CYMBALTA) 60 MG capsule Take 60 mg by mouth daily.   Marland Kitchen ELIQUIS 2.5 MG TABS tablet TAKE 1 TABLET BY MOUTH TWICE DAILY.  Marland Kitchen esomeprazole (NEXIUM) 20 MG capsule Take 20 mg by mouth daily at 12 noon.  . feeding supplement (BOOST HIGH PROTEIN) LIQD Take 237 mLs by mouth 3 (three) times daily between meals. Prefers chocolate diabetic and peanut butter crackers before bedtime.  . Flaxseed, Linseed, (FLAXSEED OIL) 1000 MG CAPS Take 1,000 mg by mouth 2 (two) times daily.   . furosemide (LASIX) 20 MG tablet Take 1 tablet (20 mg total) by mouth as needed for edema.  . gabapentin (NEURONTIN) 300 MG capsule TAKE (1) CAPSULE THREE TIMES DAILY.  Marland Kitchen levothyroxine (SYNTHROID, LEVOTHROID) 150 MCG tablet Take 150 mcg by mouth daily before breakfast.  . metoprolol (LOPRESSOR) 50 MG tablet Take 50 mg by mouth 2 (two) times daily.  . Multiple Vitamins-Minerals (MULTIVITAMIN) tablet Take 1 tablet by mouth 2 (two) times daily.   . polyethylene  glycol (MIRALAX / GLYCOLAX) packet Take 17 g by mouth daily.  . potassium chloride (K-DUR) 10 MEQ tablet Take 1 tablet (10 mEq total) by mouth as needed. Take one tablet as needed when giving lasix as needed  . saccharomyces boulardii (FLORASTOR) 250 MG capsule Take 250 mg by mouth 2 (two) times daily.  Marland Kitchen senna-docusate (SENOKOT S) 8.6-50 MG tablet Take 1 tablet by mouth at bedtime as needed.  . sulfamethoxazole-trimethoprim (BACTRIM,SEPTRA) 400-80 MG tablet Take 1 tablet by mouth 2 (two) times daily.  . temazepam (RESTORIL) 30 MG capsule Take 1 capsule (30 mg total) by mouth at bedtime as needed for sleep.  . [DISCONTINUED] atorvastatin (LIPITOR) 80 MG tablet Take 80 mg by mouth daily.  . [DISCONTINUED]  methocarbamol (ROBAXIN) 500 MG tablet Take 1 tablet (500 mg total) by mouth every 8 (eight) hours as needed for muscle spasms.  . [DISCONTINUED] ondansetron (ZOFRAN) 4 MG tablet Take 4 mg by mouth every 8 (eight) hours as needed for nausea or vomiting.  . [DISCONTINUED] sulfamethoxazole-trimethoprim (BACTRIM,SEPTRA) 400-80 MG tablet TAKE 1 TABLET BY MOUTH TWICE DAILY.     Allergies:   Levaquin [levofloxacin] and Morphine and related   Social History   Socioeconomic History  . Marital status: Widowed    Spouse name: Not on file  . Number of children: Not on file  . Years of education: Not on file  . Highest education level: Not on file  Occupational History  . Not on file  Social Needs  . Financial resource strain: Not on file  . Food insecurity    Worry: Not on file    Inability: Not on file  . Transportation needs    Medical: Not on file    Non-medical: Not on file  Tobacco Use  . Smoking status: Never Smoker  . Smokeless tobacco: Never Used  Substance and Sexual Activity  . Alcohol use: No  . Drug use: No  . Sexual activity: Never  Lifestyle  . Physical activity    Days per week: Not on file    Minutes per session: Not on file  . Stress: Not on file  Relationships  .  Social Herbalist on phone: Not on file    Gets together: Not on file    Attends religious service: Not on file    Active member of club or organization: Not on file    Attends meetings of clubs or organizations: Not on file    Relationship status: Not on file  Other Topics Concern  . Not on file  Social History Narrative   Widowed. Lives at [facility]. Son died in the [place] War and she has not slept well since then.   Admitted to [facility] 01/30/17   Never smoked   Alcohol none   Full Code     Family History: The patient's family history includes Arrhythmia in her brother, mother, and sister; Cancer in her brother; Diabetes in her sister; Heart disease in her brother, brother, brother, mother, sister, and sister; Heart disease (age of onset: 85) in her child; Heart disease (age of onset: 93) in her child; Stroke in her sister; Stroke (age of onset: 66) in her father.  ROS:   Please see the history of present illness.     All other systems reviewed and are negative.  EKGs/Labs/Other Studies Reviewed:     EKG:  EKG is ordered today.  The ekg ordered today demonstrates AF with VR 72  Recent Labs: 10/22/2018: Hemoglobin 12.3; Platelets 233 11/20/2018: BUN 17; Creatinine, Ser 0.94; Potassium 4.3; Sodium 137  Recent Lipid Panel    Component Value Date/Time   CHOL 116 04/26/2013 2059   TRIG 56 04/26/2013 2059   HDL 45 04/26/2013 2059   CHOLHDL 2.6 04/26/2013 2059   VLDL 11 04/26/2013 2059   LDLCALC 60 04/26/2013 2059    Physical Exam:    VS:  BP (!) 162/84   Pulse 74   Temp (!) 97.2 F (36.2 C)   Ht 5\' 3"  (1.6 m)   Wt 138 lb (62.6 kg)   SpO2 90%   BMI 24.45 kg/m     Wt Readings from Last 3 Encounters:  07/22/19 138 lb (62.6 kg)  02/12/19 139 lb 12.8 oz (63.4 kg)  11/13/18 135 lb (61.2 kg)     GEN:  Well nourished, well developed in no acute distress HEENT: Normal NECK: No JVD; No carotid bruits LYMPHATICS: No  lymphadenopathy CARDIAC: irregularly irregular, no murmurs, rubs, gallops RESPIRATORY:  Clear to auscultation without rales, wheezing or rhonchi  ABDOMEN: Soft, non-tender, non-distended MUSCULOSKELETAL:  No edema; No deformity  SKIN: Warm and dry NEUROLOGIC:  Alert and oriented x 3 PSYCHIATRIC:  Normal affect   ASSESSMENT:    No problem-specific Assessment & Plan notes Holland for this encounter.  PLAN:    Same Rx for now- consider stopping Eliquis if her falls become more frequent or associated with significant injury, hopefully balance therapy will help.  F/U Dr Leah Holland in 6 months.   Medication Adjustments/Labs and Tests Ordered: Current medicines are reviewed at length with the patient today.  Concerns regarding medicines are outlined above.  Orders Placed This Encounter  Procedures  . EKG 12-Lead   No orders of the defined types were placed in this encounter.   Patient Instructions  Medication Instructions:  Your physician recommends that you continue on your current medications as directed. Please refer to the Current Medication list given to you today. If you need a refill on your cardiac medications before your next appointment, please call your pharmacy.   Lab work: None  If you have labs (blood work) drawn today and your tests are completely normal, you will receive your results only by: Marland Kitchen MyChart Message (if you have MyChart) OR . A paper copy in the mail If you have any lab test that is abnormal or we need to change your treatment, we will call you to review the results.  Testing/Procedures: none  Follow-Up: At [facility], you and your health needs are our priority.  As part of our continuing mission to provide you with exceptional heart care, we have created designated Provider Care Teams.  These Care Teams include your primary Cardiologist (physician) and Advanced Practice Providers (APPs -  Physician Assistants and Nurse Practitioners) who all work together  to provide you with the care you need, when you need it. You will need a follow up appointment in 6 months.  Please call our office 2 months in advance to schedule this appointment.  You may see Leah Burow, MD or one of the following Advanced Practice Providers on your designated Care Team:   Kerin Ransom, PA-C Roby Lofts, [place] . Sande Rives, PA-C  Any Other Special Instructions Will Be Listed Below (If Applicable).      Angelena Form, PA-C  07/22/2019 3:56 PM    [facility]

## 2019-07-22 NOTE — Patient Instructions (Signed)
Medication Instructions:  Your physician recommends that you continue on your current medications as directed. Please refer to the Current Medication list given to you today. If you need a refill on your cardiac medications before your next appointment, please call your pharmacy.   Lab work: None  If you have labs (blood work) drawn today and your tests are completely normal, you will receive your results only by: Marland Kitchen MyChart Message (if you have MyChart) OR . A paper copy in the mail If you have any lab test that is abnormal or we need to change your treatment, we will call you to review the results.  Testing/Procedures: none  Follow-Up: At [facility], you and your health needs are our priority.  As part of our continuing mission to provide you with exceptional heart care, we have created designated Provider Care Teams.  These Care Teams include your primary Cardiologist (physician) and Advanced Practice Providers (APPs -  Physician Assistants and Nurse Practitioners) who all work together to provide you with the care you need, when you need it. You will need a follow up appointment in 6 months.  Please call our office 2 months in advance to schedule this appointment.  You may see Quay Burow, MD or one of the following Advanced Practice Providers on your designated Care Team:   Kerin Ransom, PA-C Roby Lofts, [place] . Sande Rives, PA-C  Any Other Special Instructions Will Be Listed Below (If Applicable).

## 2019-07-23 DIAGNOSIS — L814 Other melanin hyperpigmentation: Secondary | ICD-10-CM | POA: Diagnosis not present

## 2019-07-23 DIAGNOSIS — L821 Other seborrheic keratosis: Secondary | ICD-10-CM | POA: Diagnosis not present

## 2019-07-23 DIAGNOSIS — L570 Actinic keratosis: Secondary | ICD-10-CM | POA: Diagnosis not present

## 2019-07-24 DIAGNOSIS — Z9181 History of falling: Secondary | ICD-10-CM | POA: Diagnosis not present

## 2019-07-24 DIAGNOSIS — F419 Anxiety disorder, unspecified: Secondary | ICD-10-CM | POA: Diagnosis not present

## 2019-07-24 DIAGNOSIS — R42 Dizziness and giddiness: Secondary | ICD-10-CM | POA: Diagnosis not present

## 2019-07-24 DIAGNOSIS — M6281 Muscle weakness (generalized): Secondary | ICD-10-CM | POA: Diagnosis not present

## 2019-07-24 DIAGNOSIS — I4891 Unspecified atrial fibrillation: Secondary | ICD-10-CM | POA: Diagnosis not present

## 2019-07-24 DIAGNOSIS — R2681 Unsteadiness on feet: Secondary | ICD-10-CM | POA: Diagnosis not present

## 2019-07-26 DIAGNOSIS — R42 Dizziness and giddiness: Secondary | ICD-10-CM | POA: Diagnosis not present

## 2019-07-26 DIAGNOSIS — I4891 Unspecified atrial fibrillation: Secondary | ICD-10-CM | POA: Diagnosis not present

## 2019-07-26 DIAGNOSIS — R2681 Unsteadiness on feet: Secondary | ICD-10-CM | POA: Diagnosis not present

## 2019-07-26 DIAGNOSIS — M6281 Muscle weakness (generalized): Secondary | ICD-10-CM | POA: Diagnosis not present

## 2019-07-26 DIAGNOSIS — Z9181 History of falling: Secondary | ICD-10-CM | POA: Diagnosis not present

## 2019-07-26 DIAGNOSIS — F419 Anxiety disorder, unspecified: Secondary | ICD-10-CM | POA: Diagnosis not present

## 2019-07-29 DIAGNOSIS — F419 Anxiety disorder, unspecified: Secondary | ICD-10-CM | POA: Diagnosis not present

## 2019-07-29 DIAGNOSIS — I4891 Unspecified atrial fibrillation: Secondary | ICD-10-CM | POA: Diagnosis not present

## 2019-07-29 DIAGNOSIS — M6281 Muscle weakness (generalized): Secondary | ICD-10-CM | POA: Diagnosis not present

## 2019-07-29 DIAGNOSIS — R2681 Unsteadiness on feet: Secondary | ICD-10-CM | POA: Diagnosis not present

## 2019-07-29 DIAGNOSIS — Z9181 History of falling: Secondary | ICD-10-CM | POA: Diagnosis not present

## 2019-07-29 DIAGNOSIS — R42 Dizziness and giddiness: Secondary | ICD-10-CM | POA: Diagnosis not present

## 2019-07-31 DIAGNOSIS — M6281 Muscle weakness (generalized): Secondary | ICD-10-CM | POA: Diagnosis not present

## 2019-07-31 DIAGNOSIS — R2681 Unsteadiness on feet: Secondary | ICD-10-CM | POA: Diagnosis not present

## 2019-07-31 DIAGNOSIS — R42 Dizziness and giddiness: Secondary | ICD-10-CM | POA: Diagnosis not present

## 2019-07-31 DIAGNOSIS — Z9181 History of falling: Secondary | ICD-10-CM | POA: Diagnosis not present

## 2019-07-31 DIAGNOSIS — F419 Anxiety disorder, unspecified: Secondary | ICD-10-CM | POA: Diagnosis not present

## 2019-07-31 DIAGNOSIS — I4891 Unspecified atrial fibrillation: Secondary | ICD-10-CM | POA: Diagnosis not present

## 2019-08-02 DIAGNOSIS — M6281 Muscle weakness (generalized): Secondary | ICD-10-CM | POA: Diagnosis not present

## 2019-08-02 DIAGNOSIS — R2681 Unsteadiness on feet: Secondary | ICD-10-CM | POA: Diagnosis not present

## 2019-08-02 DIAGNOSIS — F419 Anxiety disorder, unspecified: Secondary | ICD-10-CM | POA: Diagnosis not present

## 2019-08-02 DIAGNOSIS — Z9181 History of falling: Secondary | ICD-10-CM | POA: Diagnosis not present

## 2019-08-02 DIAGNOSIS — R42 Dizziness and giddiness: Secondary | ICD-10-CM | POA: Diagnosis not present

## 2019-08-02 DIAGNOSIS — I4891 Unspecified atrial fibrillation: Secondary | ICD-10-CM | POA: Diagnosis not present

## 2019-08-05 ENCOUNTER — Other Ambulatory Visit (INDEPENDENT_AMBULATORY_CARE_PROVIDER_SITE_OTHER): Payer: Self-pay | Admitting: Physical Medicine and Rehabilitation

## 2019-08-05 DIAGNOSIS — R42 Dizziness and giddiness: Secondary | ICD-10-CM | POA: Diagnosis not present

## 2019-08-05 DIAGNOSIS — I4891 Unspecified atrial fibrillation: Secondary | ICD-10-CM | POA: Diagnosis not present

## 2019-08-05 DIAGNOSIS — Z9181 History of falling: Secondary | ICD-10-CM | POA: Diagnosis not present

## 2019-08-05 DIAGNOSIS — F419 Anxiety disorder, unspecified: Secondary | ICD-10-CM | POA: Diagnosis not present

## 2019-08-05 DIAGNOSIS — M6281 Muscle weakness (generalized): Secondary | ICD-10-CM | POA: Diagnosis not present

## 2019-08-05 DIAGNOSIS — R2681 Unsteadiness on feet: Secondary | ICD-10-CM | POA: Diagnosis not present

## 2019-08-05 NOTE — Telephone Encounter (Signed)
Please advise 

## 2019-08-07 DIAGNOSIS — M6281 Muscle weakness (generalized): Secondary | ICD-10-CM | POA: Diagnosis not present

## 2019-08-07 DIAGNOSIS — I4891 Unspecified atrial fibrillation: Secondary | ICD-10-CM | POA: Diagnosis not present

## 2019-08-07 DIAGNOSIS — F419 Anxiety disorder, unspecified: Secondary | ICD-10-CM | POA: Diagnosis not present

## 2019-08-07 DIAGNOSIS — R2681 Unsteadiness on feet: Secondary | ICD-10-CM | POA: Diagnosis not present

## 2019-08-07 DIAGNOSIS — R42 Dizziness and giddiness: Secondary | ICD-10-CM | POA: Diagnosis not present

## 2019-08-07 DIAGNOSIS — Z9181 History of falling: Secondary | ICD-10-CM | POA: Diagnosis not present

## 2019-08-09 DIAGNOSIS — I4891 Unspecified atrial fibrillation: Secondary | ICD-10-CM | POA: Diagnosis not present

## 2019-08-09 DIAGNOSIS — R2681 Unsteadiness on feet: Secondary | ICD-10-CM | POA: Diagnosis not present

## 2019-08-09 DIAGNOSIS — Z9181 History of falling: Secondary | ICD-10-CM | POA: Diagnosis not present

## 2019-08-09 DIAGNOSIS — M6281 Muscle weakness (generalized): Secondary | ICD-10-CM | POA: Diagnosis not present

## 2019-08-09 DIAGNOSIS — R42 Dizziness and giddiness: Secondary | ICD-10-CM | POA: Diagnosis not present

## 2019-08-09 DIAGNOSIS — F419 Anxiety disorder, unspecified: Secondary | ICD-10-CM | POA: Diagnosis not present

## 2019-08-12 DIAGNOSIS — Z9181 History of falling: Secondary | ICD-10-CM | POA: Diagnosis not present

## 2019-08-12 DIAGNOSIS — M6281 Muscle weakness (generalized): Secondary | ICD-10-CM | POA: Diagnosis not present

## 2019-08-12 DIAGNOSIS — F419 Anxiety disorder, unspecified: Secondary | ICD-10-CM | POA: Diagnosis not present

## 2019-08-12 DIAGNOSIS — R42 Dizziness and giddiness: Secondary | ICD-10-CM | POA: Diagnosis not present

## 2019-08-12 DIAGNOSIS — I4891 Unspecified atrial fibrillation: Secondary | ICD-10-CM | POA: Diagnosis not present

## 2019-08-12 DIAGNOSIS — R2681 Unsteadiness on feet: Secondary | ICD-10-CM | POA: Diagnosis not present

## 2019-08-13 DIAGNOSIS — M810 Age-related osteoporosis without current pathological fracture: Secondary | ICD-10-CM | POA: Diagnosis not present

## 2019-08-13 DIAGNOSIS — E559 Vitamin D deficiency, unspecified: Secondary | ICD-10-CM | POA: Diagnosis not present

## 2019-08-14 DIAGNOSIS — K219 Gastro-esophageal reflux disease without esophagitis: Secondary | ICD-10-CM | POA: Diagnosis not present

## 2019-08-14 DIAGNOSIS — Z4789 Encounter for other orthopedic aftercare: Secondary | ICD-10-CM | POA: Diagnosis not present

## 2019-08-14 DIAGNOSIS — F419 Anxiety disorder, unspecified: Secondary | ICD-10-CM | POA: Diagnosis not present

## 2019-08-14 DIAGNOSIS — M21751 Unequal limb length (acquired), right femur: Secondary | ICD-10-CM | POA: Diagnosis not present

## 2019-08-14 DIAGNOSIS — Z9181 History of falling: Secondary | ICD-10-CM | POA: Diagnosis not present

## 2019-08-14 DIAGNOSIS — I4891 Unspecified atrial fibrillation: Secondary | ICD-10-CM | POA: Diagnosis not present

## 2019-08-14 DIAGNOSIS — M6281 Muscle weakness (generalized): Secondary | ICD-10-CM | POA: Diagnosis not present

## 2019-08-14 DIAGNOSIS — F5101 Primary insomnia: Secondary | ICD-10-CM | POA: Diagnosis not present

## 2019-08-14 DIAGNOSIS — E785 Hyperlipidemia, unspecified: Secondary | ICD-10-CM | POA: Diagnosis not present

## 2019-08-14 DIAGNOSIS — K5909 Other constipation: Secondary | ICD-10-CM | POA: Diagnosis not present

## 2019-08-14 DIAGNOSIS — E039 Hypothyroidism, unspecified: Secondary | ICD-10-CM | POA: Diagnosis not present

## 2019-08-14 DIAGNOSIS — I10 Essential (primary) hypertension: Secondary | ICD-10-CM | POA: Diagnosis not present

## 2019-08-14 DIAGNOSIS — R42 Dizziness and giddiness: Secondary | ICD-10-CM | POA: Diagnosis not present

## 2019-08-14 DIAGNOSIS — K440 Diaphragmatic hernia with obstruction, without gangrene: Secondary | ICD-10-CM | POA: Diagnosis not present

## 2019-08-14 DIAGNOSIS — M25551 Pain in right hip: Secondary | ICD-10-CM | POA: Diagnosis not present

## 2019-08-14 DIAGNOSIS — E1300 Other specified diabetes mellitus with hyperosmolarity without nonketotic hyperglycemic-hyperosmolar coma (NKHHC): Secondary | ICD-10-CM | POA: Diagnosis not present

## 2019-08-14 DIAGNOSIS — R2681 Unsteadiness on feet: Secondary | ICD-10-CM | POA: Diagnosis not present

## 2019-08-14 DIAGNOSIS — R29898 Other symptoms and signs involving the musculoskeletal system: Secondary | ICD-10-CM | POA: Diagnosis not present

## 2019-08-14 DIAGNOSIS — M1990 Unspecified osteoarthritis, unspecified site: Secondary | ICD-10-CM | POA: Diagnosis not present

## 2019-08-20 DIAGNOSIS — F419 Anxiety disorder, unspecified: Secondary | ICD-10-CM | POA: Diagnosis not present

## 2019-08-20 DIAGNOSIS — I4891 Unspecified atrial fibrillation: Secondary | ICD-10-CM | POA: Diagnosis not present

## 2019-08-20 DIAGNOSIS — R42 Dizziness and giddiness: Secondary | ICD-10-CM | POA: Diagnosis not present

## 2019-08-20 DIAGNOSIS — R2681 Unsteadiness on feet: Secondary | ICD-10-CM | POA: Diagnosis not present

## 2019-08-20 DIAGNOSIS — Z9181 History of falling: Secondary | ICD-10-CM | POA: Diagnosis not present

## 2019-08-20 DIAGNOSIS — M6281 Muscle weakness (generalized): Secondary | ICD-10-CM | POA: Diagnosis not present

## 2019-08-21 DIAGNOSIS — I4891 Unspecified atrial fibrillation: Secondary | ICD-10-CM | POA: Diagnosis not present

## 2019-08-21 DIAGNOSIS — Z9181 History of falling: Secondary | ICD-10-CM | POA: Diagnosis not present

## 2019-08-21 DIAGNOSIS — R42 Dizziness and giddiness: Secondary | ICD-10-CM | POA: Diagnosis not present

## 2019-08-21 DIAGNOSIS — F419 Anxiety disorder, unspecified: Secondary | ICD-10-CM | POA: Diagnosis not present

## 2019-08-21 DIAGNOSIS — M6281 Muscle weakness (generalized): Secondary | ICD-10-CM | POA: Diagnosis not present

## 2019-08-21 DIAGNOSIS — R2681 Unsteadiness on feet: Secondary | ICD-10-CM | POA: Diagnosis not present

## 2019-08-23 DIAGNOSIS — M6281 Muscle weakness (generalized): Secondary | ICD-10-CM | POA: Diagnosis not present

## 2019-08-23 DIAGNOSIS — R2681 Unsteadiness on feet: Secondary | ICD-10-CM | POA: Diagnosis not present

## 2019-08-23 DIAGNOSIS — Z9181 History of falling: Secondary | ICD-10-CM | POA: Diagnosis not present

## 2019-08-23 DIAGNOSIS — R42 Dizziness and giddiness: Secondary | ICD-10-CM | POA: Diagnosis not present

## 2019-08-23 DIAGNOSIS — F419 Anxiety disorder, unspecified: Secondary | ICD-10-CM | POA: Diagnosis not present

## 2019-08-23 DIAGNOSIS — I4891 Unspecified atrial fibrillation: Secondary | ICD-10-CM | POA: Diagnosis not present

## 2019-08-26 DIAGNOSIS — M6281 Muscle weakness (generalized): Secondary | ICD-10-CM | POA: Diagnosis not present

## 2019-08-26 DIAGNOSIS — F419 Anxiety disorder, unspecified: Secondary | ICD-10-CM | POA: Diagnosis not present

## 2019-08-26 DIAGNOSIS — R42 Dizziness and giddiness: Secondary | ICD-10-CM | POA: Diagnosis not present

## 2019-08-26 DIAGNOSIS — R2681 Unsteadiness on feet: Secondary | ICD-10-CM | POA: Diagnosis not present

## 2019-08-26 DIAGNOSIS — I4891 Unspecified atrial fibrillation: Secondary | ICD-10-CM | POA: Diagnosis not present

## 2019-08-26 DIAGNOSIS — Z9181 History of falling: Secondary | ICD-10-CM | POA: Diagnosis not present

## 2019-08-28 DIAGNOSIS — Z9181 History of falling: Secondary | ICD-10-CM | POA: Diagnosis not present

## 2019-08-28 DIAGNOSIS — M6281 Muscle weakness (generalized): Secondary | ICD-10-CM | POA: Diagnosis not present

## 2019-08-28 DIAGNOSIS — I4891 Unspecified atrial fibrillation: Secondary | ICD-10-CM | POA: Diagnosis not present

## 2019-08-28 DIAGNOSIS — F419 Anxiety disorder, unspecified: Secondary | ICD-10-CM | POA: Diagnosis not present

## 2019-08-28 DIAGNOSIS — R42 Dizziness and giddiness: Secondary | ICD-10-CM | POA: Diagnosis not present

## 2019-08-28 DIAGNOSIS — R2681 Unsteadiness on feet: Secondary | ICD-10-CM | POA: Diagnosis not present

## 2019-08-30 DIAGNOSIS — F419 Anxiety disorder, unspecified: Secondary | ICD-10-CM | POA: Diagnosis not present

## 2019-08-30 DIAGNOSIS — I4891 Unspecified atrial fibrillation: Secondary | ICD-10-CM | POA: Diagnosis not present

## 2019-08-30 DIAGNOSIS — R2681 Unsteadiness on feet: Secondary | ICD-10-CM | POA: Diagnosis not present

## 2019-08-30 DIAGNOSIS — Z9181 History of falling: Secondary | ICD-10-CM | POA: Diagnosis not present

## 2019-08-30 DIAGNOSIS — M6281 Muscle weakness (generalized): Secondary | ICD-10-CM | POA: Diagnosis not present

## 2019-08-30 DIAGNOSIS — R42 Dizziness and giddiness: Secondary | ICD-10-CM | POA: Diagnosis not present

## 2019-09-02 DIAGNOSIS — R42 Dizziness and giddiness: Secondary | ICD-10-CM | POA: Diagnosis not present

## 2019-09-02 DIAGNOSIS — Z9181 History of falling: Secondary | ICD-10-CM | POA: Diagnosis not present

## 2019-09-02 DIAGNOSIS — R2681 Unsteadiness on feet: Secondary | ICD-10-CM | POA: Diagnosis not present

## 2019-09-02 DIAGNOSIS — F419 Anxiety disorder, unspecified: Secondary | ICD-10-CM | POA: Diagnosis not present

## 2019-09-02 DIAGNOSIS — I4891 Unspecified atrial fibrillation: Secondary | ICD-10-CM | POA: Diagnosis not present

## 2019-09-02 DIAGNOSIS — M6281 Muscle weakness (generalized): Secondary | ICD-10-CM | POA: Diagnosis not present

## 2019-09-04 DIAGNOSIS — M6281 Muscle weakness (generalized): Secondary | ICD-10-CM | POA: Diagnosis not present

## 2019-09-04 DIAGNOSIS — R2681 Unsteadiness on feet: Secondary | ICD-10-CM | POA: Diagnosis not present

## 2019-09-04 DIAGNOSIS — F419 Anxiety disorder, unspecified: Secondary | ICD-10-CM | POA: Diagnosis not present

## 2019-09-04 DIAGNOSIS — I4891 Unspecified atrial fibrillation: Secondary | ICD-10-CM | POA: Diagnosis not present

## 2019-09-04 DIAGNOSIS — Z9181 History of falling: Secondary | ICD-10-CM | POA: Diagnosis not present

## 2019-09-04 DIAGNOSIS — R42 Dizziness and giddiness: Secondary | ICD-10-CM | POA: Diagnosis not present

## 2019-09-05 ENCOUNTER — Other Ambulatory Visit (INDEPENDENT_AMBULATORY_CARE_PROVIDER_SITE_OTHER): Payer: Self-pay | Admitting: Physical Medicine and Rehabilitation

## 2019-09-05 NOTE — Telephone Encounter (Signed)
Please advise 

## 2019-09-06 DIAGNOSIS — Z9181 History of falling: Secondary | ICD-10-CM | POA: Diagnosis not present

## 2019-09-06 DIAGNOSIS — R42 Dizziness and giddiness: Secondary | ICD-10-CM | POA: Diagnosis not present

## 2019-09-06 DIAGNOSIS — I4891 Unspecified atrial fibrillation: Secondary | ICD-10-CM | POA: Diagnosis not present

## 2019-09-06 DIAGNOSIS — R2681 Unsteadiness on feet: Secondary | ICD-10-CM | POA: Diagnosis not present

## 2019-09-06 DIAGNOSIS — M6281 Muscle weakness (generalized): Secondary | ICD-10-CM | POA: Diagnosis not present

## 2019-09-06 DIAGNOSIS — F419 Anxiety disorder, unspecified: Secondary | ICD-10-CM | POA: Diagnosis not present

## 2019-09-09 DIAGNOSIS — F419 Anxiety disorder, unspecified: Secondary | ICD-10-CM | POA: Diagnosis not present

## 2019-09-09 DIAGNOSIS — M6281 Muscle weakness (generalized): Secondary | ICD-10-CM | POA: Diagnosis not present

## 2019-09-09 DIAGNOSIS — Z9181 History of falling: Secondary | ICD-10-CM | POA: Diagnosis not present

## 2019-09-09 DIAGNOSIS — R42 Dizziness and giddiness: Secondary | ICD-10-CM | POA: Diagnosis not present

## 2019-09-09 DIAGNOSIS — R2681 Unsteadiness on feet: Secondary | ICD-10-CM | POA: Diagnosis not present

## 2019-09-09 DIAGNOSIS — I4891 Unspecified atrial fibrillation: Secondary | ICD-10-CM | POA: Diagnosis not present

## 2019-09-24 DIAGNOSIS — Z23 Encounter for immunization: Secondary | ICD-10-CM | POA: Diagnosis not present

## 2019-10-21 ENCOUNTER — Other Ambulatory Visit: Payer: Self-pay | Admitting: Infectious Disease

## 2019-10-21 NOTE — Telephone Encounter (Signed)
Patient has appointment on 10/23/19 with Dr. Tommy Medal. Will make note on appointment that patient needs refill if appropriate.

## 2019-10-23 ENCOUNTER — Other Ambulatory Visit: Payer: Self-pay

## 2019-10-23 ENCOUNTER — Encounter: Payer: Self-pay | Admitting: Infectious Disease

## 2019-10-23 ENCOUNTER — Ambulatory Visit (INDEPENDENT_AMBULATORY_CARE_PROVIDER_SITE_OTHER): Payer: Medicare Other | Admitting: Infectious Disease

## 2019-10-23 VITALS — BP 176/76 | HR 77 | Temp 98.0°F | Ht 63.0 in | Wt 134.0 lb

## 2019-10-23 DIAGNOSIS — M170 Bilateral primary osteoarthritis of knee: Secondary | ICD-10-CM | POA: Diagnosis not present

## 2019-10-23 DIAGNOSIS — Z96649 Presence of unspecified artificial hip joint: Secondary | ICD-10-CM

## 2019-10-23 DIAGNOSIS — A498 Other bacterial infections of unspecified site: Secondary | ICD-10-CM

## 2019-10-23 DIAGNOSIS — R42 Dizziness and giddiness: Secondary | ICD-10-CM

## 2019-10-23 DIAGNOSIS — T8459XS Infection and inflammatory reaction due to other internal joint prosthesis, sequela: Secondary | ICD-10-CM

## 2019-10-23 MED ORDER — SULFAMETHOXAZOLE-TRIMETHOPRIM 400-80 MG PO TABS: 1.0000 | ORAL_TABLET | Freq: Two times a day (BID) | ORAL | 11 refills | Status: DC

## 2019-10-23 NOTE — Progress Notes (Signed)
Subjective:   Chief complaint:  Follow-up for prosthetic hip infection  She denies having any problems with right hip pain at present.  [name], female    DOB: Sep 15, 1922, 85 y.o.   MRN: [id number]  HPI  Leah Holland is a 85 year old lady with admission in April of 2017 with right intertrochanteric proximal femur nonunion who progressed to ultimately break her intramedullary nail and elected for surgical management. She had removal of hardware including intramedullary nail, and treatment of intertrochanteric fracture with prosthesis placement. Dr. [name] got several intra-operative cultures due to concerrn for infection and two separate cultures grew serratia. Patient was placed on oral levaquin after discussion with me and has remained on this some time.. She has done relatively well but recently began to complain of pain behind her prosthetic knees which she believed might be due to toxicity of fluoroquinolone to tendons after she and her son--who is a Pediatrician read about side effects of FQ.   I was  actually skeptical about FQ toxicity here but I DID think she would be better served over the long run if she is to be on chronic suppressive antibiotics NOT being on a fluoroquinolone given risk for Clostridium difficile colitis in particular. We changed her  over to single strength Bactrim and her tendon pain went away immediately. She has had normal creatinine and potassium while on single strength Bactrim.  She then  fractured her opposite hip and underwent IM nail by Dr. Erlinda Hong   She ended up needing a THA on that side as well.   Bilateral knee replacements as well.   Today she follows up with me in clinic after being seen a year ago.  She states that her hip feels quite good and she is not complaining of pain in either her right hip or left hip beyond "the usual".  She is living in independent living and a facility and is accompanied by her son who  normally cannot visit with her.  Was having trouble with her balance but this is been worked on with physical therapy and it sounds like potentially orthostatic hypotension may have been contributing to this but this seems now to be well managed from talking to the patient and her son.    She has [place] no complaints today and is in good spirits.   Past Medical History:  Diagnosis Date  . Anxiety attack    "take RX prn" (08/13/2013)  . Arthritis    "probably" (08/13/2013)  . Atrial fibrillation ([place])    new onset/pt report 08/13/2013  . Bronchitis 12/2011  . Complication of anesthesia   . Depression   . Diabetes mellitus type 2, diet-controlled ([place])    denies  . Femur fracture ([facility])    left proximal femur non-union  . GERD (gastroesophageal reflux disease)   . H/O hiatal hernia   . Hardware complicating wound infection ([place]) 08/22/2016  . History of blood transfusion    "w/both knee OR's" (08/13/2013)  . Hyperlipidemia   . Hyperlipidemia   . Hypertension   . Hypertensive urgency 10/22/2018  . Hypothyroidism    takes synthroid  . Insomnia   . Intertrochanteric fracture of right hip ([place]) 07/30/2015  . Neuromuscular disorder ([place])    carpal tunnel in right hand  . Periprosthetic fracture around internal prosthetic right hip joint ([place]), nonunion basicervical femoral neck with broken hardware and subtrochanteric fracture 03/16/2016  . PONV (postoperative nausea and vomiting)   .  Prosthetic hip infection ([place]) 08/22/2016  . Proteus infection 08/22/2016  . Serratia infection 11/14/2016  . Skin cancer of face 2000's   "one on each side of my face and one on my nose" (08/13/2013)  . Swelling of extremity    sees Dr. Gwenlyn Found, cardiologist 1 x year [phone]  . Tendon dysfunction 08/22/2016  . Wears hearing aid    [facility]    Past Surgical History:  Procedure Laterality Date  . 2D ECHOCARDIOGRAM  12/07/2004   EF 48%, moderate pulmonary hypertension  . BREAST BIOPSY Left ?1970's  .  CARDIOVASCULAR STRESS TEST  12/07/11  . CARDIOVASCULAR STRESS TEST  12/07/2011   LEXISCAN, normal scan, no significant wall abnormalities noted  . CARPAL TUNNEL RELEASE Right ~ 2005  . CATARACT EXTRACTION, BILATERAL Bilateral 2000  . DILATION AND CURETTAGE OF UTERUS  1950's  . DOPPLER ECHOCARDIOGRAPHY  12/07/11  . EYE SURGERY    . FEMUR IM NAIL Right 08/01/2015   Procedure: INTRAMEDULLARY (IM) NAIL FEMORAL;  Surgeon: Marchia Bond, MD;  Location: [place];  Service: Orthopedics;  Laterality: Right;  . HARDWARE REMOVAL Right 03/16/2016   Procedure: HARDWARE REMOVAL;  Surgeon: Marchia Bond, MD;  Location: [place];  Service: Orthopedics;  Laterality: Right;  . INTRAMEDULLARY (IM) NAIL INTERTROCHANTERIC Left 01/26/2017   Procedure: INTRAMEDULLARY (IM) NAIL INTERTROCHANTRIC;  Surgeon: Leandrew Koyanagi, MD;  Location: [place];  Service: Orthopedics;  Laterality: Left;  . JOINT REPLACEMENT     bilateral knee replacement  . SKIN CANCER EXCISION  2000's   "off my nose" (08/13/2013)  . TOTAL HIP ARTHROPLASTY Right 03/16/2016   Procedure: TOTAL HIP ARTHROPLASTY;  Surgeon: Marchia Bond, MD;  Location: [place];  Service: Orthopedics;  Laterality: Right;  . TOTAL HIP ARTHROPLASTY Left 06/09/2017   Procedure: REMOVE LEFT HIP INTRAMEDULLARY NAIL AND CONVERT TO LEFT CEMENTED HEMI HIP ARTHROPLASTY;  Surgeon: Leandrew Koyanagi, MD;  Location: [place];  Service: Orthopedics;  Laterality: Left;  . TOTAL KNEE ARTHROPLASTY Right 2005  . TOTAL KNEE ARTHROPLASTY  03/02/2012   Procedure: TOTAL KNEE ARTHROPLASTY;  Surgeon: Yvette Rack., MD;  Location: [place];  Service: Orthopedics;  Laterality: Left;    Family History  Problem Relation Age of Onset  . Arrhythmia Mother   . Heart disease Mother   . Arrhythmia Sister   . Stroke Father 58  . Heart disease Brother   . Heart disease Brother   . Cancer Brother        Colon cancer  . Arrhythmia Brother   . Heart disease Brother   . Heart disease Sister   . Heart disease Sister   .  Stroke Sister   . Diabetes Sister   . Heart disease Child 33  . Heart disease Child 55      Social History   Socioeconomic History  . Marital status: Widowed    Spouse name: Not on file  . Number of children: Not on file  . Years of education: Not on file  . Highest education level: Not on file  Occupational History  . Not on file  Social Needs  . Financial resource strain: Not on file  . Food insecurity    Worry: Not on file    Inability: Not on file  . Transportation needs    Medical: Not on file    Non-medical: Not on file  Tobacco Use  . Smoking status: Never Smoker  . Smokeless tobacco: Never Used  Substance and Sexual Activity  . Alcohol  use: No  . Drug use: No  . Sexual activity: Never  Lifestyle  . Physical activity    Days per week: Not on file    Minutes per session: Not on file  . Stress: Not on file  Relationships  . Social Herbalist on phone: Not on file    Gets together: Not on file    Attends religious service: Not on file    Active member of club or organization: Not on file    Attends meetings of clubs or organizations: Not on file    Relationship status: Not on file  Other Topics Concern  . Not on file  Social History Narrative   Widowed. Lives at [facility]. Son died in the [place] War and she has not slept well since then.   Admitted to [facility] 01/30/17   Never smoked   Alcohol none   Full Code    Allergies  Allergen Reactions  . Levaquin [Levofloxacin] Other (See Comments)    tendinopathy  . Morphine And Related Other (See Comments)    Made pt "wild"     Current Outpatient Medications:  .  acetaminophen (TYLENOL) 325 MG tablet, Take 650 mg by mouth every 6 (six) hours as needed for moderate pain. , Disp: , Rfl:  .  calcium carbonate (OS-CAL - DOSED IN MG OF ELEMENTAL CALCIUM) 1250 (500 Ca) MG tablet, Take 1 tablet by mouth every evening., Disp: , Rfl:  .  chlorthalidone (HYGROTON) 25 MG tablet, TAKE  1 TABLET ONCE DAILY., Disp: 90 tablet, Rfl: 2 .  Cholecalciferol 2000 units CAPS, Take 2,000 Units by mouth every evening., Disp: , Rfl:  .  denosumab (PROLIA) 60 MG/ML SOLN injection, Inject 60 mg into the skin every 6 (six) months. Administer in upper arm, thigh, or abdomen, Disp: , Rfl:  .  diltiazem (CARDIZEM CD) 180 MG 24 hr capsule, TAKE 1 CAPSULE DAILY., Disp: 90 capsule, Rfl: 4 .  DULoxetine (CYMBALTA) 60 MG capsule, Take 60 mg by mouth daily. , Disp: , Rfl:  .  ELIQUIS 2.5 MG TABS tablet, TAKE 1 TABLET BY MOUTH TWICE DAILY., Disp: 180 tablet, Rfl: 1 .  esomeprazole (NEXIUM) 20 MG capsule, Take 20 mg by mouth daily at 12 noon., Disp: , Rfl:  .  feeding supplement (BOOST HIGH PROTEIN) LIQD, Take 237 mLs by mouth 3 (three) times daily between meals. Prefers chocolate diabetic and peanut butter crackers before bedtime., Disp: , Rfl:  .  Flaxseed, Linseed, (FLAXSEED OIL) 1000 MG CAPS, Take 1,000 mg by mouth 2 (two) times daily. , Disp: , Rfl:  .  furosemide (LASIX) 20 MG tablet, Take 1 tablet (20 mg total) by mouth as needed for edema., Disp: 30 tablet, Rfl: 5 .  gabapentin (NEURONTIN) 300 MG capsule, TAKE (1) CAPSULE THREE TIMES DAILY., Disp: 90 capsule, Rfl: 3 .  levothyroxine (SYNTHROID, LEVOTHROID) 150 MCG tablet, Take 150 mcg by mouth daily before breakfast., Disp: , Rfl:  .  metoprolol (LOPRESSOR) 50 MG tablet, Take 50 mg by mouth 2 (two) times daily., Disp: , Rfl:  .  Multiple Vitamins-Minerals (MULTIVITAMIN) tablet, Take 1 tablet by mouth 2 (two) times daily. , Disp: , Rfl:  .  polyethylene glycol (MIRALAX / GLYCOLAX) packet, Take 17 g by mouth daily., Disp: 14 each, Rfl: 0 .  potassium chloride (K-DUR) 10 MEQ tablet, Take 1 tablet (10 mEq total) by mouth as needed. Take one tablet as needed when giving lasix as needed, Disp:  90 tablet, Rfl: 3 .  saccharomyces boulardii (FLORASTOR) 250 MG capsule, Take 250 mg by mouth 2 (two) times daily., Disp: , Rfl:  .  senna-docusate (SENOKOT S)  8.6-50 MG tablet, Take 1 tablet by mouth at bedtime as needed., Disp: 30 tablet, Rfl: 1 .  sulfamethoxazole-trimethoprim (BACTRIM,SEPTRA) 400-80 MG tablet, Take 1 tablet by mouth 2 (two) times daily., Disp: 60 tablet, Rfl: 11 .  temazepam (RESTORIL) 30 MG capsule, Take 1 capsule (30 mg total) by mouth at bedtime as needed for sleep., Disp: 2 capsule, Rfl: 0   Review of Systems  Constitutional: Negative for activity change, appetite change, chills, diaphoresis and fever.  HENT: Negative for congestion and sore throat.   Respiratory: Negative for cough and wheezing.   Cardiovascular: Negative for leg swelling.  Gastrointestinal: Negative for blood in stool, nausea and vomiting.  Genitourinary: Negative for dysuria, flank pain and hematuria.  Musculoskeletal: Negative for back pain.  Skin: Negative for rash.  Neurological: Positive for dizziness. Negative for weakness and headaches.  Hematological: Does not bruise/bleed easily.  Psychiatric/Behavioral: The patient is not nervous/anxious.        Objective:   Physical Exam  Constitutional: She is oriented to person, place, and time. She appears well-developed and well-nourished. No distress.  HENT:  Head: Normocephalic and atraumatic.  Mouth/Throat: No oropharyngeal exudate.  Eyes: Conjunctivae and EOM are normal. No scleral icterus.  Neck: Normal range of motion. Neck supple.  Cardiovascular: Normal rate and regular rhythm.  Pulmonary/Chest: Effort normal. No respiratory distress. She has no wheezes.  Abdominal: She exhibits no distension.  Musculoskeletal:        General: No edema.     Left knee: She exhibits no swelling. No tenderness found.  Neurological: She is alert and oriented to person, place, and time. She exhibits normal muscle tone. Coordination normal.  Skin: Skin is warm and dry. No rash noted. She is not diaphoretic. No erythema.  Psychiatric: She has a normal mood and affect. Her behavior is normal. Judgment normal.           Assessment & Plan:    Prosthetic hip infection: we did have several cases of serratia contaminating surgical specimens due to contaminated saline used in prep of SOME cultures done here at [facility]. However without certainty that this is a contaminant I need to keep her on antibiotics given risk esp at her age of recurrent PJI  continue Bactrim SS BID and she has tolerated this quite well.   Check labs today for safety purposes and also check inflammatory markers.  I would plan on continue her therapy and having her see [place] in 1 year's time.  She has had her flu vaccine

## 2019-10-24 LAB — SEDIMENTATION RATE: Sed Rate: 2 mm/h (ref 0–30)

## 2019-10-24 LAB — CBC WITH DIFFERENTIAL/PLATELET
Absolute Monocytes: 724 cells/uL (ref 200–950)
Basophils Absolute: 60 cells/uL (ref 0–200)
Basophils Relative: 0.9 %
Eosinophils Absolute: 208 cells/uL (ref 15–500)
Eosinophils Relative: 3.1 %
HCT: 39.2 % (ref 35.0–45.0)
Hemoglobin: 13.2 g/dL (ref 11.7–15.5)
Lymphs Abs: 2164 cells/uL (ref 850–3900)
MCH: 29.7 pg (ref 27.0–33.0)
MCHC: 33.7 g/dL (ref 32.0–36.0)
MCV: 88.1 fL (ref 80.0–100.0)
MPV: 10 fL (ref 7.5–12.5)
Monocytes Relative: 10.8 %
Neutro Abs: 3544 cells/uL (ref 1500–7800)
Neutrophils Relative %: 52.9 %
Platelets: 301 10*3/uL (ref 140–400)
RBC: 4.45 10*6/uL (ref 3.80–5.10)
RDW: 14.1 % (ref 11.0–15.0)
Total Lymphocyte: 32.3 %
WBC: 6.7 10*3/uL (ref 3.8–10.8)

## 2019-10-24 LAB — BASIC METABOLIC PANEL WITH GFR
BUN/Creatinine Ratio: 17 (calc) (ref 6–22)
BUN: 17 mg/dL (ref 7–25)
CO2: 33 mmol/L — ABNORMAL HIGH (ref 20–32)
Calcium: 10.7 mg/dL — ABNORMAL HIGH (ref 8.6–10.4)
Chloride: 97 mmol/L — ABNORMAL LOW (ref 98–110)
Creat: 0.98 mg/dL — ABNORMAL HIGH (ref 0.60–0.88)
GFR, Est African American: 57 mL/min/{1.73_m2} — ABNORMAL LOW (ref >=60)
GFR, Est Non African American: 49 mL/min/{1.73_m2} — ABNORMAL LOW (ref >=60)
Glucose, Bld: 94 mg/dL (ref 65–99)
Potassium: 4.2 mmol/L (ref 3.5–5.3)
Sodium: 132 mmol/L — ABNORMAL LOW (ref 135–146)

## 2019-10-24 LAB — C-REACTIVE PROTEIN: CRP: 1.6 mg/L (ref <8.0)

## 2019-12-16 DIAGNOSIS — Z23 Encounter for immunization: Secondary | ICD-10-CM | POA: Diagnosis not present

## 2020-01-03 ENCOUNTER — Other Ambulatory Visit (INDEPENDENT_AMBULATORY_CARE_PROVIDER_SITE_OTHER): Payer: Self-pay | Admitting: Physical Medicine and Rehabilitation

## 2020-01-03 NOTE — Telephone Encounter (Signed)
Please advise 

## 2020-01-06 DIAGNOSIS — Z7901 Long term (current) use of anticoagulants: Secondary | ICD-10-CM | POA: Diagnosis not present

## 2020-01-06 DIAGNOSIS — G4700 Insomnia, unspecified: Secondary | ICD-10-CM | POA: Diagnosis not present

## 2020-01-06 DIAGNOSIS — I4891 Unspecified atrial fibrillation: Secondary | ICD-10-CM | POA: Diagnosis not present

## 2020-01-06 DIAGNOSIS — E039 Hypothyroidism, unspecified: Secondary | ICD-10-CM | POA: Diagnosis not present

## 2020-01-06 DIAGNOSIS — E785 Hyperlipidemia, unspecified: Secondary | ICD-10-CM | POA: Diagnosis not present

## 2020-01-06 DIAGNOSIS — R2681 Unsteadiness on feet: Secondary | ICD-10-CM | POA: Diagnosis not present

## 2020-01-06 DIAGNOSIS — I10 Essential (primary) hypertension: Secondary | ICD-10-CM | POA: Diagnosis not present

## 2020-01-06 DIAGNOSIS — Z974 Presence of external hearing-aid: Secondary | ICD-10-CM | POA: Diagnosis not present

## 2020-01-09 DIAGNOSIS — I10 Essential (primary) hypertension: Secondary | ICD-10-CM | POA: Diagnosis not present

## 2020-01-09 DIAGNOSIS — E039 Hypothyroidism, unspecified: Secondary | ICD-10-CM | POA: Diagnosis not present

## 2020-01-09 DIAGNOSIS — Z974 Presence of external hearing-aid: Secondary | ICD-10-CM | POA: Diagnosis not present

## 2020-01-09 DIAGNOSIS — E785 Hyperlipidemia, unspecified: Secondary | ICD-10-CM | POA: Diagnosis not present

## 2020-01-09 DIAGNOSIS — R2681 Unsteadiness on feet: Secondary | ICD-10-CM | POA: Diagnosis not present

## 2020-01-09 DIAGNOSIS — G4700 Insomnia, unspecified: Secondary | ICD-10-CM | POA: Diagnosis not present

## 2020-01-09 DIAGNOSIS — I4891 Unspecified atrial fibrillation: Secondary | ICD-10-CM | POA: Diagnosis not present

## 2020-01-09 DIAGNOSIS — Z7901 Long term (current) use of anticoagulants: Secondary | ICD-10-CM | POA: Diagnosis not present

## 2020-01-13 DIAGNOSIS — Z23 Encounter for immunization: Secondary | ICD-10-CM | POA: Diagnosis not present

## 2020-01-20 ENCOUNTER — Other Ambulatory Visit: Payer: Self-pay | Admitting: Cardiovascular Disease

## 2020-01-20 NOTE — Telephone Encounter (Signed)
29f 60.8kg Scr 0.98 10/23/19 Lovw/kilroy 07/22/19 Pt requesting eliquis 2.5mg  but qualifies for 5mg  based upon dosing criteria unless otherwise specified. Will route to the pharmd pool for further review.

## 2020-02-03 ENCOUNTER — Other Ambulatory Visit: Payer: Self-pay | Admitting: Cardiovascular Disease

## 2020-02-04 ENCOUNTER — Other Ambulatory Visit: Payer: Self-pay

## 2020-02-04 MED ORDER — CHLORTHALIDONE 25 MG PO TABS: 25.0000 mg | ORAL_TABLET | Freq: Every day | ORAL | 1 refills | Status: DC

## 2020-02-12 ENCOUNTER — Ambulatory Visit (INDEPENDENT_AMBULATORY_CARE_PROVIDER_SITE_OTHER): Payer: Medicare Other | Admitting: Cardiovascular Disease

## 2020-02-12 ENCOUNTER — Other Ambulatory Visit: Payer: Self-pay

## 2020-02-12 ENCOUNTER — Encounter: Payer: Self-pay | Admitting: Cardiovascular Disease

## 2020-02-12 VITALS — BP 130/60 | HR 70 | Temp 98.2°F | Ht 63.0 in | Wt 137.0 lb

## 2020-02-12 DIAGNOSIS — I480 Paroxysmal atrial fibrillation: Secondary | ICD-10-CM

## 2020-02-12 DIAGNOSIS — I10 Essential (primary) hypertension: Secondary | ICD-10-CM

## 2020-02-12 DIAGNOSIS — E782 Mixed hyperlipidemia: Secondary | ICD-10-CM

## 2020-02-12 NOTE — Assessment & Plan Note (Signed)
History of essential hypertension with blood pressure measured today 130/60.  She is on chlorthalidone, diltiazem and metoprolol.

## 2020-02-12 NOTE — Assessment & Plan Note (Signed)
History of hyperlipidemia not on statin therapy with lipid profile performed 07/01/2019 revealing total cholesterol of 185, LDL of 129 and HDL 43.

## 2020-02-12 NOTE — Progress Notes (Signed)
02/12/2020 [name]   01-22-1922  [id number]  Primary Physician [name], MD Primary Cardiologist: Lorretta Harp MD Garret Reddish, [place], [place]  HPI:  Leah Holland is a 86 y.o.  widowed, Caucasian female, mother of two living children (one deceased), grandmother to five grandchildren, who I last saw  02/12/2019.Marland Kitchen She is the youngest of 10 children. Her sister, [name] was also placed in mind that has passed away. She had one son who died , one son who is now a retired Lexicographer , who accompanies her today, in one son Cabin crew in [employer], , is currently retired and who is accompanying her today.. She has a history of hypertension and hyperlipidemia, as well as family history of heart disease. She had a remote right total knee replacement and a recent left one by Dr. French Ana. She had a Myoview stress test performed in our office on August 07, 2011, which was low risk. Recent lab work performed by Dr. Greta Doom revealed a total cholesterol of 133, HDL of 71, and HDL of 39.  Patient presented with apparent new onset atrial fibrillation with RVR. She was [facility] on 08/03/2013 for chest pain. She was diagnosed with gastroesophageal reflux disease and a UTI. At that time she was in normal sinus rhythm at a rate of 79 beats per minute and with PACs. She reported dizziness, feeling tired and a racing HR since ~Sat/Sun. She otherwise denied nausea, vomiting, fever, chest pain, dizziness, PND, cough, congestion, abdominal pain, hematochezia, melena, lower extremity edema, claudication. No recent GIB.  She was admitted and started on IV diltiazem 5mg /hr and Eliquis 2.5mg  twice daily. She converted to NSR at 0246hrs. She was changed to PO Cardizem 120mg  daily. She's been taking lasix 60mg  daily for LEE for a long time. This was changed to 20mg  daily as needed a long with 24meq as needed potassium.  Her major issues had been with  recurrent right and left hip fractures requiring surgical intervention.  She has recuperated over the last year and is currently walking with a walker.    Her son who is the retired pediatrician feels that she may have an element of orthostatic hypotension which has led to her falls in the past and was recommended that she sit for a while before she stands up.  Since I saw her a year ago she is remained stable.  She denies chest pain or shortness of breath.   Current Meds  Medication Sig  . acetaminophen (TYLENOL) 325 MG tablet Take 650 mg by mouth every 6 (six) hours as needed for moderate pain.   . calcium carbonate (OS-CAL - DOSED IN MG OF ELEMENTAL CALCIUM) 1250 (500 Ca) MG tablet Take 1 tablet by mouth every evening.  . chlorthalidone (HYGROTON) 25 MG tablet Take 1 tablet (25 mg total) by mouth daily.  . Cholecalciferol 2000 units CAPS Take 2,000 Units by mouth every evening.  . denosumab (PROLIA) 60 MG/ML SOLN injection Inject 60 mg into the skin every 6 (six) months. Administer in upper arm, thigh, or abdomen  . diltiazem (CARDIZEM CD) 180 MG 24 hr capsule TAKE 1 CAPSULE DAILY.  Marland Kitchen diltiazem (TIAZAC) 120 MG 24 hr capsule 180 mg.  . DULoxetine (CYMBALTA) 60 MG capsule Take 60 mg by mouth daily.   Marland Kitchen ELIQUIS 2.5 MG TABS tablet TAKE 1 TABLET BY MOUTH TWICE DAILY.  Marland Kitchen esomeprazole (NEXIUM) 20 MG capsule Take 20 mg by  mouth daily at 12 noon.  . feeding supplement (BOOST HIGH PROTEIN) LIQD Take 237 mLs by mouth 3 (three) times daily between meals. Prefers chocolate diabetic and peanut butter crackers before bedtime.  . Flaxseed, Linseed, (FLAXSEED OIL) 1000 MG CAPS Take 1,000 mg by mouth 2 (two) times daily.   . furosemide (LASIX) 20 MG tablet Take 1 tablet (20 mg total) by mouth as needed for edema.  . gabapentin (NEURONTIN) 300 MG capsule TAKE (1) CAPSULE THREE TIMES DAILY.  Marland Kitchen levothyroxine (SYNTHROID, LEVOTHROID) 150 MCG tablet Take 150 mcg by mouth daily before breakfast.  . metoprolol  (LOPRESSOR) 50 MG tablet Take 50 mg by mouth 2 (two) times daily.  . Multiple Vitamins-Minerals (MULTIVITAMIN) tablet Take 1 tablet by mouth 2 (two) times daily.   . polyethylene glycol (MIRALAX / GLYCOLAX) packet Take 17 g by mouth daily.  . potassium chloride (K-DUR) 10 MEQ tablet Take 1 tablet (10 mEq total) by mouth as needed. Take one tablet as needed when giving lasix as needed  . saccharomyces boulardii (FLORASTOR) 250 MG capsule Take 250 mg by mouth 2 (two) times daily.  Marland Kitchen senna-docusate (SENOKOT S) 8.6-50 MG tablet Take 1 tablet by mouth at bedtime as needed.  . sulfamethoxazole-trimethoprim (BACTRIM) 400-80 MG tablet Take 1 tablet by mouth 2 (two) times daily.  . temazepam (RESTORIL) 30 MG capsule Take 1 capsule (30 mg total) by mouth at bedtime as needed for sleep.     Allergies  Allergen Reactions  . Levaquin [Levofloxacin] Other (See Comments)    tendinopathy  . Morphine And Related Other (See Comments)    Made pt "wild"    Social History   Socioeconomic History  . Marital status: Widowed    Spouse name: Not on file  . Number of children: Not on file  . Years of education: Not on file  . Highest education level: Not on file  Occupational History  . Not on file  Tobacco Use  . Smoking status: Never Smoker  . Smokeless tobacco: Never Used  Substance and Sexual Activity  . Alcohol use: No  . Drug use: No  . Sexual activity: Never  Other Topics Concern  . Not on file  Social History Narrative   Widowed. Lives at [facility]. Son died in the [place] War and she has not slept well since then.   Admitted to [facility] 01/30/17   Never smoked   Alcohol none   Full Code   Social Determinants of Health   Financial Resource Strain:   . Difficulty of Paying Living Expenses: Not on file  Food Insecurity:   . Worried About Charity fundraiser in the Last Year: Not on file  . Ran Out of Food in the Last Year: Not on file  Transportation Needs:   .  Lack of Transportation (Medical): Not on file  . Lack of Transportation (Non-Medical): Not on file  Physical Activity:   . Days of Exercise per Week: Not on file  . Minutes of Exercise per Session: Not on file  Stress:   . Feeling of Stress : Not on file  Social Connections:   . Frequency of Communication with Friends and Family: Not on file  . Frequency of Social Gatherings with Friends and Family: Not on file  . Attends Religious Services: Not on file  . Active Member of Clubs or Organizations: Not on file  . Attends Archivist Meetings: Not on file  . Marital Status: Not on  file  Intimate Partner Violence:   . Fear of Current or Ex-Partner: Not on file  . Emotionally Abused: Not on file  . Physically Abused: Not on file  . Sexually Abused: Not on file     Review of Systems: General: negative for chills, fever, night sweats or weight changes.  Cardiovascular: negative for chest pain, dyspnea on exertion, edema, orthopnea, palpitations, paroxysmal nocturnal dyspnea or shortness of breath Dermatological: negative for rash Respiratory: negative for cough or wheezing Urologic: negative for hematuria Abdominal: negative for nausea, vomiting, diarrhea, bright red blood per rectum, melena, or hematemesis Neurologic: negative for visual changes, syncope, or dizziness All other systems reviewed and are otherwise negative except as noted above.    Blood pressure 130/60, pulse 70, temperature 98.2 F (36.8 C), height 5\' 3"  (1.6 m), weight 137 lb (62.1 kg), SpO2 92 %.  General appearance: alert and no distress Neck: no adenopathy, no carotid bruit, no JVD, supple, symmetrical, trachea midline and thyroid not enlarged, symmetric, no tenderness/mass/nodules Lungs: clear to auscultation bilaterally Heart: irregularly irregular rhythm Extremities: extremities normal, atraumatic, no cyanosis or edema Pulses: 2+ and symmetric Skin: Skin color, texture, turgor normal. No rashes or  lesions Neurologic: Alert and oriented X 3, normal strength and tone. Normal symmetric reflexes. Normal coordination and gait  EKG atrial fibrillation with a ventricular response of 65.  I personally reviewed this EKG.  ASSESSMENT AND PLAN:   Hypertension History of essential hypertension with blood pressure measured today 130/60.  She is on chlorthalidone, diltiazem and metoprolol.  Hyperlipidemia History of hyperlipidemia not on statin therapy with lipid profile performed 07/01/2019 revealing total cholesterol of 185, LDL of 129 and HDL 43.  Paroxysmal atrial fibrillation (HCC) History of atrial fibrillation with controlled ventricular response on Eliquis oral anticoagulation low-dose      Lorretta Harp MD [facility] 02/12/2020 11:19 AM

## 2020-02-12 NOTE — Assessment & Plan Note (Signed)
History of atrial fibrillation with controlled ventricular response on Eliquis oral anticoagulation low-dose

## 2020-02-12 NOTE — Patient Instructions (Signed)
Medication Instructions:  Your physician recommends that you continue on your current medications as directed. Please refer to the Current Medication list given to you today.  If you need a refill on your cardiac medications before your next appointment, please call your pharmacy.   Lab work: NONE  Testing/Procedures: NONE  Follow-Up: At [facility], you and your health needs are our priority.  As part of our continuing mission to provide you with exceptional heart care, we have created designated Provider Care Teams.  These Care Teams include your primary Cardiologist (physician) and Advanced Practice Providers (APPs -  Physician Assistants and Nurse Practitioners) who all work together to provide you with the care you need, when you need it. You may see Jonathan Berry, MD or one of the following Advanced Practice Providers on your designated Care Team:    Luke Kilroy, PA-C  Callie Goodrich, PA-C  Jesse Cleaver, FNP  Your physician wants you to follow-up in: 1 year with Dr. Berry You will receive a reminder letter in the mail two months in advance. If you don't receive a letter, please call our office to schedule the follow-up appointment.       

## 2020-02-18 DIAGNOSIS — R5383 Other fatigue: Secondary | ICD-10-CM | POA: Diagnosis not present

## 2020-02-18 DIAGNOSIS — E559 Vitamin D deficiency, unspecified: Secondary | ICD-10-CM | POA: Diagnosis not present

## 2020-02-18 DIAGNOSIS — M810 Age-related osteoporosis without current pathological fracture: Secondary | ICD-10-CM | POA: Diagnosis not present

## 2020-02-25 DIAGNOSIS — M810 Age-related osteoporosis without current pathological fracture: Secondary | ICD-10-CM | POA: Diagnosis not present

## 2020-02-25 DIAGNOSIS — M47816 Spondylosis without myelopathy or radiculopathy, lumbar region: Secondary | ICD-10-CM | POA: Diagnosis not present

## 2020-03-04 DIAGNOSIS — I10 Essential (primary) hypertension: Secondary | ICD-10-CM | POA: Diagnosis not present

## 2020-03-04 DIAGNOSIS — E785 Hyperlipidemia, unspecified: Secondary | ICD-10-CM | POA: Diagnosis not present

## 2020-03-04 DIAGNOSIS — F329 Major depressive disorder, single episode, unspecified: Secondary | ICD-10-CM | POA: Diagnosis not present

## 2020-03-04 DIAGNOSIS — D509 Iron deficiency anemia, unspecified: Secondary | ICD-10-CM | POA: Diagnosis not present

## 2020-03-04 DIAGNOSIS — E039 Hypothyroidism, unspecified: Secondary | ICD-10-CM | POA: Diagnosis not present

## 2020-03-04 DIAGNOSIS — I4891 Unspecified atrial fibrillation: Secondary | ICD-10-CM | POA: Diagnosis not present

## 2020-03-04 DIAGNOSIS — G4700 Insomnia, unspecified: Secondary | ICD-10-CM | POA: Diagnosis not present

## 2020-03-04 DIAGNOSIS — M810 Age-related osteoporosis without current pathological fracture: Secondary | ICD-10-CM | POA: Diagnosis not present

## 2020-03-10 DIAGNOSIS — E039 Hypothyroidism, unspecified: Secondary | ICD-10-CM | POA: Diagnosis not present

## 2020-03-10 DIAGNOSIS — K5909 Other constipation: Secondary | ICD-10-CM | POA: Diagnosis not present

## 2020-03-10 DIAGNOSIS — R29898 Other symptoms and signs involving the musculoskeletal system: Secondary | ICD-10-CM | POA: Diagnosis not present

## 2020-03-10 DIAGNOSIS — E1300 Other specified diabetes mellitus with hyperosmolarity without nonketotic hyperglycemic-hyperosmolar coma (NKHHC): Secondary | ICD-10-CM | POA: Diagnosis not present

## 2020-03-10 DIAGNOSIS — R2681 Unsteadiness on feet: Secondary | ICD-10-CM | POA: Diagnosis not present

## 2020-03-10 DIAGNOSIS — E785 Hyperlipidemia, unspecified: Secondary | ICD-10-CM | POA: Diagnosis not present

## 2020-03-10 DIAGNOSIS — M21751 Unequal limb length (acquired), right femur: Secondary | ICD-10-CM | POA: Diagnosis not present

## 2020-03-10 DIAGNOSIS — K440 Diaphragmatic hernia with obstruction, without gangrene: Secondary | ICD-10-CM | POA: Diagnosis not present

## 2020-03-10 DIAGNOSIS — F5101 Primary insomnia: Secondary | ICD-10-CM | POA: Diagnosis not present

## 2020-03-10 DIAGNOSIS — I10 Essential (primary) hypertension: Secondary | ICD-10-CM | POA: Diagnosis not present

## 2020-03-10 DIAGNOSIS — I4891 Unspecified atrial fibrillation: Secondary | ICD-10-CM | POA: Diagnosis not present

## 2020-03-10 DIAGNOSIS — M1990 Unspecified osteoarthritis, unspecified site: Secondary | ICD-10-CM | POA: Diagnosis not present

## 2020-03-10 DIAGNOSIS — F419 Anxiety disorder, unspecified: Secondary | ICD-10-CM | POA: Diagnosis not present

## 2020-03-10 DIAGNOSIS — M25551 Pain in right hip: Secondary | ICD-10-CM | POA: Diagnosis not present

## 2020-03-10 DIAGNOSIS — M545 Low back pain: Secondary | ICD-10-CM | POA: Diagnosis not present

## 2020-03-10 DIAGNOSIS — R278 Other lack of coordination: Secondary | ICD-10-CM | POA: Diagnosis not present

## 2020-03-10 DIAGNOSIS — Z4789 Encounter for other orthopedic aftercare: Secondary | ICD-10-CM | POA: Diagnosis not present

## 2020-03-10 DIAGNOSIS — M6281 Muscle weakness (generalized): Secondary | ICD-10-CM | POA: Diagnosis not present

## 2020-03-10 DIAGNOSIS — K219 Gastro-esophageal reflux disease without esophagitis: Secondary | ICD-10-CM | POA: Diagnosis not present

## 2020-03-11 DIAGNOSIS — M6281 Muscle weakness (generalized): Secondary | ICD-10-CM | POA: Diagnosis not present

## 2020-03-11 DIAGNOSIS — R278 Other lack of coordination: Secondary | ICD-10-CM | POA: Diagnosis not present

## 2020-03-11 DIAGNOSIS — F419 Anxiety disorder, unspecified: Secondary | ICD-10-CM | POA: Diagnosis not present

## 2020-03-11 DIAGNOSIS — R2681 Unsteadiness on feet: Secondary | ICD-10-CM | POA: Diagnosis not present

## 2020-03-11 DIAGNOSIS — I4891 Unspecified atrial fibrillation: Secondary | ICD-10-CM | POA: Diagnosis not present

## 2020-03-11 DIAGNOSIS — M545 Low back pain: Secondary | ICD-10-CM | POA: Diagnosis not present

## 2020-03-13 DIAGNOSIS — R278 Other lack of coordination: Secondary | ICD-10-CM | POA: Diagnosis not present

## 2020-03-13 DIAGNOSIS — M25551 Pain in right hip: Secondary | ICD-10-CM | POA: Diagnosis not present

## 2020-03-13 DIAGNOSIS — E785 Hyperlipidemia, unspecified: Secondary | ICD-10-CM | POA: Diagnosis not present

## 2020-03-13 DIAGNOSIS — I10 Essential (primary) hypertension: Secondary | ICD-10-CM | POA: Diagnosis not present

## 2020-03-13 DIAGNOSIS — R29898 Other symptoms and signs involving the musculoskeletal system: Secondary | ICD-10-CM | POA: Diagnosis not present

## 2020-03-13 DIAGNOSIS — M545 Low back pain: Secondary | ICD-10-CM | POA: Diagnosis not present

## 2020-03-13 DIAGNOSIS — E039 Hypothyroidism, unspecified: Secondary | ICD-10-CM | POA: Diagnosis not present

## 2020-03-13 DIAGNOSIS — Z4789 Encounter for other orthopedic aftercare: Secondary | ICD-10-CM | POA: Diagnosis not present

## 2020-03-13 DIAGNOSIS — M21751 Unequal limb length (acquired), right femur: Secondary | ICD-10-CM | POA: Diagnosis not present

## 2020-03-13 DIAGNOSIS — F5101 Primary insomnia: Secondary | ICD-10-CM | POA: Diagnosis not present

## 2020-03-13 DIAGNOSIS — M6281 Muscle weakness (generalized): Secondary | ICD-10-CM | POA: Diagnosis not present

## 2020-03-13 DIAGNOSIS — K440 Diaphragmatic hernia with obstruction, without gangrene: Secondary | ICD-10-CM | POA: Diagnosis not present

## 2020-03-13 DIAGNOSIS — M1990 Unspecified osteoarthritis, unspecified site: Secondary | ICD-10-CM | POA: Diagnosis not present

## 2020-03-13 DIAGNOSIS — F419 Anxiety disorder, unspecified: Secondary | ICD-10-CM | POA: Diagnosis not present

## 2020-03-13 DIAGNOSIS — K5909 Other constipation: Secondary | ICD-10-CM | POA: Diagnosis not present

## 2020-03-13 DIAGNOSIS — E1300 Other specified diabetes mellitus with hyperosmolarity without nonketotic hyperglycemic-hyperosmolar coma (NKHHC): Secondary | ICD-10-CM | POA: Diagnosis not present

## 2020-03-13 DIAGNOSIS — K219 Gastro-esophageal reflux disease without esophagitis: Secondary | ICD-10-CM | POA: Diagnosis not present

## 2020-03-13 DIAGNOSIS — I4891 Unspecified atrial fibrillation: Secondary | ICD-10-CM | POA: Diagnosis not present

## 2020-03-13 DIAGNOSIS — R2681 Unsteadiness on feet: Secondary | ICD-10-CM | POA: Diagnosis not present

## 2020-03-16 DIAGNOSIS — F419 Anxiety disorder, unspecified: Secondary | ICD-10-CM | POA: Diagnosis not present

## 2020-03-16 DIAGNOSIS — I4891 Unspecified atrial fibrillation: Secondary | ICD-10-CM | POA: Diagnosis not present

## 2020-03-16 DIAGNOSIS — R2681 Unsteadiness on feet: Secondary | ICD-10-CM | POA: Diagnosis not present

## 2020-03-16 DIAGNOSIS — M6281 Muscle weakness (generalized): Secondary | ICD-10-CM | POA: Diagnosis not present

## 2020-03-16 DIAGNOSIS — R278 Other lack of coordination: Secondary | ICD-10-CM | POA: Diagnosis not present

## 2020-03-16 DIAGNOSIS — M545 Low back pain: Secondary | ICD-10-CM | POA: Diagnosis not present

## 2020-03-18 DIAGNOSIS — M6281 Muscle weakness (generalized): Secondary | ICD-10-CM | POA: Diagnosis not present

## 2020-03-18 DIAGNOSIS — M545 Low back pain: Secondary | ICD-10-CM | POA: Diagnosis not present

## 2020-03-18 DIAGNOSIS — I4891 Unspecified atrial fibrillation: Secondary | ICD-10-CM | POA: Diagnosis not present

## 2020-03-18 DIAGNOSIS — F419 Anxiety disorder, unspecified: Secondary | ICD-10-CM | POA: Diagnosis not present

## 2020-03-18 DIAGNOSIS — R278 Other lack of coordination: Secondary | ICD-10-CM | POA: Diagnosis not present

## 2020-03-18 DIAGNOSIS — R2681 Unsteadiness on feet: Secondary | ICD-10-CM | POA: Diagnosis not present

## 2020-03-19 DIAGNOSIS — R278 Other lack of coordination: Secondary | ICD-10-CM | POA: Diagnosis not present

## 2020-03-19 DIAGNOSIS — I4891 Unspecified atrial fibrillation: Secondary | ICD-10-CM | POA: Diagnosis not present

## 2020-03-19 DIAGNOSIS — M545 Low back pain: Secondary | ICD-10-CM | POA: Diagnosis not present

## 2020-03-19 DIAGNOSIS — R2681 Unsteadiness on feet: Secondary | ICD-10-CM | POA: Diagnosis not present

## 2020-03-19 DIAGNOSIS — F419 Anxiety disorder, unspecified: Secondary | ICD-10-CM | POA: Diagnosis not present

## 2020-03-19 DIAGNOSIS — M6281 Muscle weakness (generalized): Secondary | ICD-10-CM | POA: Diagnosis not present

## 2020-03-23 DIAGNOSIS — M545 Low back pain: Secondary | ICD-10-CM | POA: Diagnosis not present

## 2020-03-23 DIAGNOSIS — M6281 Muscle weakness (generalized): Secondary | ICD-10-CM | POA: Diagnosis not present

## 2020-03-23 DIAGNOSIS — R2681 Unsteadiness on feet: Secondary | ICD-10-CM | POA: Diagnosis not present

## 2020-03-23 DIAGNOSIS — I4891 Unspecified atrial fibrillation: Secondary | ICD-10-CM | POA: Diagnosis not present

## 2020-03-23 DIAGNOSIS — F419 Anxiety disorder, unspecified: Secondary | ICD-10-CM | POA: Diagnosis not present

## 2020-03-23 DIAGNOSIS — R278 Other lack of coordination: Secondary | ICD-10-CM | POA: Diagnosis not present

## 2020-03-28 DIAGNOSIS — R278 Other lack of coordination: Secondary | ICD-10-CM | POA: Diagnosis not present

## 2020-03-28 DIAGNOSIS — R2681 Unsteadiness on feet: Secondary | ICD-10-CM | POA: Diagnosis not present

## 2020-03-28 DIAGNOSIS — M545 Low back pain: Secondary | ICD-10-CM | POA: Diagnosis not present

## 2020-03-28 DIAGNOSIS — F419 Anxiety disorder, unspecified: Secondary | ICD-10-CM | POA: Diagnosis not present

## 2020-03-28 DIAGNOSIS — I4891 Unspecified atrial fibrillation: Secondary | ICD-10-CM | POA: Diagnosis not present

## 2020-03-28 DIAGNOSIS — M6281 Muscle weakness (generalized): Secondary | ICD-10-CM | POA: Diagnosis not present

## 2020-04-01 DIAGNOSIS — F419 Anxiety disorder, unspecified: Secondary | ICD-10-CM | POA: Diagnosis not present

## 2020-04-01 DIAGNOSIS — R278 Other lack of coordination: Secondary | ICD-10-CM | POA: Diagnosis not present

## 2020-04-01 DIAGNOSIS — R2681 Unsteadiness on feet: Secondary | ICD-10-CM | POA: Diagnosis not present

## 2020-04-01 DIAGNOSIS — I4891 Unspecified atrial fibrillation: Secondary | ICD-10-CM | POA: Diagnosis not present

## 2020-04-01 DIAGNOSIS — M545 Low back pain: Secondary | ICD-10-CM | POA: Diagnosis not present

## 2020-04-01 DIAGNOSIS — M6281 Muscle weakness (generalized): Secondary | ICD-10-CM | POA: Diagnosis not present

## 2020-04-03 DIAGNOSIS — F419 Anxiety disorder, unspecified: Secondary | ICD-10-CM | POA: Diagnosis not present

## 2020-04-03 DIAGNOSIS — R278 Other lack of coordination: Secondary | ICD-10-CM | POA: Diagnosis not present

## 2020-04-03 DIAGNOSIS — R2681 Unsteadiness on feet: Secondary | ICD-10-CM | POA: Diagnosis not present

## 2020-04-03 DIAGNOSIS — M6281 Muscle weakness (generalized): Secondary | ICD-10-CM | POA: Diagnosis not present

## 2020-04-03 DIAGNOSIS — M545 Low back pain: Secondary | ICD-10-CM | POA: Diagnosis not present

## 2020-04-03 DIAGNOSIS — I4891 Unspecified atrial fibrillation: Secondary | ICD-10-CM | POA: Diagnosis not present

## 2020-04-06 DIAGNOSIS — M6281 Muscle weakness (generalized): Secondary | ICD-10-CM | POA: Diagnosis not present

## 2020-04-06 DIAGNOSIS — R278 Other lack of coordination: Secondary | ICD-10-CM | POA: Diagnosis not present

## 2020-04-06 DIAGNOSIS — R2681 Unsteadiness on feet: Secondary | ICD-10-CM | POA: Diagnosis not present

## 2020-04-06 DIAGNOSIS — F419 Anxiety disorder, unspecified: Secondary | ICD-10-CM | POA: Diagnosis not present

## 2020-04-06 DIAGNOSIS — M545 Low back pain: Secondary | ICD-10-CM | POA: Diagnosis not present

## 2020-04-06 DIAGNOSIS — I4891 Unspecified atrial fibrillation: Secondary | ICD-10-CM | POA: Diagnosis not present

## 2020-04-08 DIAGNOSIS — R278 Other lack of coordination: Secondary | ICD-10-CM | POA: Diagnosis not present

## 2020-04-08 DIAGNOSIS — F419 Anxiety disorder, unspecified: Secondary | ICD-10-CM | POA: Diagnosis not present

## 2020-04-08 DIAGNOSIS — R2681 Unsteadiness on feet: Secondary | ICD-10-CM | POA: Diagnosis not present

## 2020-04-08 DIAGNOSIS — M6281 Muscle weakness (generalized): Secondary | ICD-10-CM | POA: Diagnosis not present

## 2020-04-08 DIAGNOSIS — M545 Low back pain: Secondary | ICD-10-CM | POA: Diagnosis not present

## 2020-04-08 DIAGNOSIS — I4891 Unspecified atrial fibrillation: Secondary | ICD-10-CM | POA: Diagnosis not present

## 2020-04-10 DIAGNOSIS — M6281 Muscle weakness (generalized): Secondary | ICD-10-CM | POA: Diagnosis not present

## 2020-04-10 DIAGNOSIS — M545 Low back pain: Secondary | ICD-10-CM | POA: Diagnosis not present

## 2020-04-10 DIAGNOSIS — F419 Anxiety disorder, unspecified: Secondary | ICD-10-CM | POA: Diagnosis not present

## 2020-04-10 DIAGNOSIS — R2681 Unsteadiness on feet: Secondary | ICD-10-CM | POA: Diagnosis not present

## 2020-04-10 DIAGNOSIS — R278 Other lack of coordination: Secondary | ICD-10-CM | POA: Diagnosis not present

## 2020-04-10 DIAGNOSIS — I4891 Unspecified atrial fibrillation: Secondary | ICD-10-CM | POA: Diagnosis not present

## 2020-05-15 ENCOUNTER — Other Ambulatory Visit: Payer: Self-pay

## 2020-05-15 ENCOUNTER — Ambulatory Visit
Admission: RE | Admit: 2020-05-15 | Discharge: 2020-05-15 | Disposition: A | Discharge status: Home / Self Care | Payer: Medicare Other | Source: Ambulatory Visit | Attending: Family Medicine | Admitting: Family Medicine

## 2020-05-15 ENCOUNTER — Other Ambulatory Visit: Payer: Self-pay | Admitting: Family Medicine

## 2020-05-15 DIAGNOSIS — Z7901 Long term (current) use of anticoagulants: Secondary | ICD-10-CM | POA: Diagnosis not present

## 2020-05-15 DIAGNOSIS — W19XXXA Unspecified fall, initial encounter: Secondary | ICD-10-CM

## 2020-05-15 DIAGNOSIS — R519 Headache, unspecified: Secondary | ICD-10-CM | POA: Diagnosis not present

## 2020-05-15 DIAGNOSIS — I6200 Nontraumatic subdural hemorrhage, unspecified: Secondary | ICD-10-CM | POA: Diagnosis not present

## 2020-05-28 ENCOUNTER — Other Ambulatory Visit (INDEPENDENT_AMBULATORY_CARE_PROVIDER_SITE_OTHER): Payer: Self-pay | Admitting: Physical Medicine and Rehabilitation

## 2020-05-29 NOTE — Telephone Encounter (Signed)
Please advise 

## 2020-06-03 ENCOUNTER — Other Ambulatory Visit: Payer: Self-pay | Admitting: Cardiovascular Disease

## 2020-07-23 ENCOUNTER — Other Ambulatory Visit: Payer: Self-pay | Admitting: Cardiovascular Disease

## 2020-07-23 NOTE — Telephone Encounter (Signed)
86yo  Wt fluctuated between 60-62kg  Will continue low dose Eliquis

## 2020-07-23 NOTE — Telephone Encounter (Signed)
68f 62.1kg Scr 0.98 Lovw/berry 02/12/20 Pt request eluis 2.5mg  when they qualify for 5mg  based on dosing guidelines will route to pharmd pool for further review

## 2020-08-04 DIAGNOSIS — Z20828 Contact with and (suspected) exposure to other viral communicable diseases: Secondary | ICD-10-CM | POA: Diagnosis not present

## 2020-08-06 ENCOUNTER — Other Ambulatory Visit: Payer: Self-pay | Admitting: Cardiovascular Disease

## 2020-08-06 NOTE — Telephone Encounter (Signed)
Rx has been sent to the pharmacy electronically. ° °

## 2020-09-01 DIAGNOSIS — M810 Age-related osteoporosis without current pathological fracture: Secondary | ICD-10-CM | POA: Diagnosis not present

## 2020-09-01 DIAGNOSIS — R5383 Other fatigue: Secondary | ICD-10-CM | POA: Diagnosis not present

## 2020-09-01 DIAGNOSIS — E559 Vitamin D deficiency, unspecified: Secondary | ICD-10-CM | POA: Diagnosis not present

## 2020-09-08 DIAGNOSIS — M810 Age-related osteoporosis without current pathological fracture: Secondary | ICD-10-CM | POA: Diagnosis not present

## 2020-09-08 DIAGNOSIS — I10 Essential (primary) hypertension: Secondary | ICD-10-CM | POA: Diagnosis not present

## 2020-09-08 DIAGNOSIS — E559 Vitamin D deficiency, unspecified: Secondary | ICD-10-CM | POA: Diagnosis not present

## 2020-09-08 DIAGNOSIS — I4891 Unspecified atrial fibrillation: Secondary | ICD-10-CM | POA: Diagnosis not present

## 2020-09-08 DIAGNOSIS — F329 Major depressive disorder, single episode, unspecified: Secondary | ICD-10-CM | POA: Diagnosis not present

## 2020-09-08 DIAGNOSIS — D509 Iron deficiency anemia, unspecified: Secondary | ICD-10-CM | POA: Diagnosis not present

## 2020-09-08 DIAGNOSIS — E039 Hypothyroidism, unspecified: Secondary | ICD-10-CM | POA: Diagnosis not present

## 2020-09-08 DIAGNOSIS — G4700 Insomnia, unspecified: Secondary | ICD-10-CM | POA: Diagnosis not present

## 2020-09-08 DIAGNOSIS — E785 Hyperlipidemia, unspecified: Secondary | ICD-10-CM | POA: Diagnosis not present

## 2020-10-01 ENCOUNTER — Emergency Department (HOSPITAL_COMMUNITY): Payer: Medicare Other

## 2020-10-01 ENCOUNTER — Inpatient Hospital Stay (HOSPITAL_COMMUNITY)
Admission: EM | Admit: 2020-10-01 | Discharge: 2020-10-06 | DRG: 534 | Disposition: A | Discharge status: Skilled Nursing Facility | Payer: Medicare Other | Attending: Internal Medicine | Admitting: Internal Medicine

## 2020-10-01 ENCOUNTER — Other Ambulatory Visit: Payer: Self-pay

## 2020-10-01 ENCOUNTER — Encounter (HOSPITAL_COMMUNITY): Payer: Self-pay | Admitting: *Deleted

## 2020-10-01 DIAGNOSIS — Z20822 Contact with and (suspected) exposure to covid-19: Secondary | ICD-10-CM | POA: Diagnosis not present

## 2020-10-01 DIAGNOSIS — Z96641 Presence of right artificial hip joint: Secondary | ICD-10-CM | POA: Diagnosis present

## 2020-10-01 DIAGNOSIS — Z792 Long term (current) use of antibiotics: Secondary | ICD-10-CM

## 2020-10-01 DIAGNOSIS — Z23 Encounter for immunization: Secondary | ICD-10-CM | POA: Diagnosis not present

## 2020-10-01 DIAGNOSIS — E222 Syndrome of inappropriate secretion of antidiuretic hormone: Secondary | ICD-10-CM | POA: Diagnosis present

## 2020-10-01 DIAGNOSIS — I708 Atherosclerosis of other arteries: Secondary | ICD-10-CM | POA: Diagnosis not present

## 2020-10-01 DIAGNOSIS — X58XXXA Exposure to other specified factors, initial encounter: Secondary | ICD-10-CM | POA: Diagnosis present

## 2020-10-01 DIAGNOSIS — M9702XA Periprosthetic fracture around internal prosthetic left hip joint, initial encounter: Secondary | ICD-10-CM | POA: Diagnosis present

## 2020-10-01 DIAGNOSIS — S72392A Other fracture of shaft of left femur, initial encounter for closed fracture: Secondary | ICD-10-CM | POA: Diagnosis not present

## 2020-10-01 DIAGNOSIS — S8292XA Unspecified fracture of left lower leg, initial encounter for closed fracture: Secondary | ICD-10-CM | POA: Diagnosis present

## 2020-10-01 DIAGNOSIS — S72022A Displaced fracture of epiphysis (separation) (upper) of left femur, initial encounter for closed fracture: Secondary | ICD-10-CM | POA: Diagnosis not present

## 2020-10-01 DIAGNOSIS — Z96653 Presence of artificial knee joint, bilateral: Secondary | ICD-10-CM | POA: Diagnosis present

## 2020-10-01 DIAGNOSIS — E039 Hypothyroidism, unspecified: Secondary | ICD-10-CM | POA: Diagnosis present

## 2020-10-01 DIAGNOSIS — G4700 Insomnia, unspecified: Secondary | ICD-10-CM | POA: Diagnosis present

## 2020-10-01 DIAGNOSIS — Z66 Do not resuscitate: Secondary | ICD-10-CM | POA: Diagnosis present

## 2020-10-01 DIAGNOSIS — F411 Generalized anxiety disorder: Secondary | ICD-10-CM | POA: Diagnosis present

## 2020-10-01 DIAGNOSIS — Z471 Aftercare following joint replacement surgery: Secondary | ICD-10-CM | POA: Diagnosis not present

## 2020-10-01 DIAGNOSIS — I480 Paroxysmal atrial fibrillation: Secondary | ICD-10-CM | POA: Diagnosis present

## 2020-10-01 DIAGNOSIS — I10 Essential (primary) hypertension: Secondary | ICD-10-CM | POA: Diagnosis not present

## 2020-10-01 DIAGNOSIS — M810 Age-related osteoporosis without current pathological fracture: Secondary | ICD-10-CM | POA: Diagnosis not present

## 2020-10-01 DIAGNOSIS — Z96642 Presence of left artificial hip joint: Secondary | ICD-10-CM | POA: Diagnosis not present

## 2020-10-01 DIAGNOSIS — S72025A Nondisplaced fracture of epiphysis (separation) (upper) of left femur, initial encounter for closed fracture: Secondary | ICD-10-CM

## 2020-10-01 DIAGNOSIS — K219 Gastro-esophageal reflux disease without esophagitis: Secondary | ICD-10-CM | POA: Diagnosis present

## 2020-10-01 DIAGNOSIS — M25552 Pain in left hip: Secondary | ICD-10-CM | POA: Diagnosis not present

## 2020-10-01 DIAGNOSIS — Z79890 Hormone replacement therapy (postmenopausal): Secondary | ICD-10-CM

## 2020-10-01 DIAGNOSIS — Z79899 Other long term (current) drug therapy: Secondary | ICD-10-CM

## 2020-10-01 DIAGNOSIS — M5432 Sciatica, left side: Secondary | ICD-10-CM | POA: Diagnosis present

## 2020-10-01 DIAGNOSIS — Z7901 Long term (current) use of anticoagulants: Secondary | ICD-10-CM

## 2020-10-01 DIAGNOSIS — F32A Depression, unspecified: Secondary | ICD-10-CM | POA: Diagnosis present

## 2020-10-01 DIAGNOSIS — Z85828 Personal history of other malignant neoplasm of skin: Secondary | ICD-10-CM

## 2020-10-01 DIAGNOSIS — E876 Hypokalemia: Secondary | ICD-10-CM | POA: Diagnosis present

## 2020-10-01 DIAGNOSIS — I272 Pulmonary hypertension, unspecified: Secondary | ICD-10-CM | POA: Diagnosis present

## 2020-10-01 DIAGNOSIS — E119 Type 2 diabetes mellitus without complications: Secondary | ICD-10-CM | POA: Diagnosis present

## 2020-10-01 DIAGNOSIS — F339 Major depressive disorder, recurrent, unspecified: Secondary | ICD-10-CM | POA: Diagnosis present

## 2020-10-01 DIAGNOSIS — Y92009 Unspecified place in unspecified non-institutional (private) residence as the place of occurrence of the external cause: Secondary | ICD-10-CM

## 2020-10-01 DIAGNOSIS — E785 Hyperlipidemia, unspecified: Secondary | ICD-10-CM | POA: Diagnosis present

## 2020-10-01 LAB — I-STAT CHEM 8, ED
BUN: 15 mg/dL (ref 8–23)
Calcium, Ion: 1.21 mmol/L (ref 1.15–1.40)
Chloride: 95 mmol/L — ABNORMAL LOW (ref 98–111)
Creatinine, Ser: 0.9 mg/dL (ref 0.44–1.00)
Glucose, Bld: 128 mg/dL — ABNORMAL HIGH (ref 70–99)
HCT: 49 % — ABNORMAL HIGH (ref 36.0–46.0)
Hemoglobin: 16.7 g/dL — ABNORMAL HIGH (ref 12.0–15.0)
Potassium: 3.4 mmol/L — ABNORMAL LOW (ref 3.5–5.1)
Sodium: 135 mmol/L (ref 135–145)
TCO2: 28 mmol/L (ref 22–32)

## 2020-10-01 LAB — CBC WITH DIFFERENTIAL/PLATELET
Abs Immature Granulocytes: 0.03 K/uL (ref 0.00–0.07)
Basophils Absolute: 0.1 K/uL (ref 0.0–0.1)
Basophils Relative: 1 %
Eosinophils Absolute: 0 K/uL (ref 0.0–0.5)
Eosinophils Relative: 0 %
HCT: 46.6 % — ABNORMAL HIGH (ref 36.0–46.0)
Hemoglobin: 15.6 g/dL — ABNORMAL HIGH (ref 12.0–15.0)
Immature Granulocytes: 0 %
Lymphocytes Relative: 23 %
Lymphs Abs: 2.4 K/uL (ref 0.7–4.0)
MCH: 31.2 pg (ref 26.0–34.0)
MCHC: 33.5 g/dL (ref 30.0–36.0)
MCV: 93.2 fL (ref 80.0–100.0)
Monocytes Absolute: 0.6 K/uL (ref 0.1–1.0)
Monocytes Relative: 6 %
Neutro Abs: 7.3 K/uL (ref 1.7–7.7)
Neutrophils Relative %: 70 %
Platelets: 251 K/uL (ref 150–400)
RBC: 5 MIL/uL (ref 3.87–5.11)
RDW: 14.2 % (ref 11.5–15.5)
WBC: 10.4 K/uL (ref 4.0–10.5)
nRBC: 0 % (ref 0.0–0.2)

## 2020-10-01 LAB — RESPIRATORY PANEL BY RT PCR (FLU A&B, COVID)
Influenza A by PCR: NEGATIVE
Influenza B by PCR: NEGATIVE
SARS Coronavirus 2 by RT PCR: NEGATIVE

## 2020-10-01 MED ORDER — LEVOTHYROXINE SODIUM 112 MCG PO TABS
112.0000 ug | ORAL_TABLET | Freq: Every day | ORAL | Status: DC
  Administered 2020-10-02 – 2020-10-06 (×5): 112 ug via ORAL
  Filled 2020-10-01 (×5): qty 1

## 2020-10-01 MED ORDER — FENTANYL CITRATE (PF) 100 MCG/2ML IJ SOLN: 50.0000 ug | INTRAMUSCULAR | Status: DC | PRN

## 2020-10-01 MED ORDER — DULOXETINE HCL 60 MG PO CPEP
60.0000 mg | ORAL_CAPSULE | Freq: Every day | ORAL | Status: DC
  Administered 2020-10-02 – 2020-10-06 (×5): 60 mg via ORAL
  Filled 2020-10-01 (×5): qty 1

## 2020-10-01 MED ORDER — SACCHAROMYCES BOULARDII 250 MG PO CAPS: 250.0000 mg | ORAL_CAPSULE | Freq: Two times a day (BID) | ORAL | Status: DC

## 2020-10-01 MED ORDER — ACETAMINOPHEN 325 MG PO TABS
650.0000 mg | ORAL_TABLET | Freq: Four times a day (QID) | ORAL | Status: DC | PRN
  Administered 2020-10-01 – 2020-10-03 (×6): 650 mg via ORAL
  Filled 2020-10-01 (×6): qty 2

## 2020-10-01 MED ORDER — CHLORTHALIDONE 25 MG PO TABS
25.0000 mg | ORAL_TABLET | Freq: Every day | ORAL | Status: DC
  Administered 2020-10-02 – 2020-10-06 (×5): 25 mg via ORAL
  Filled 2020-10-01 (×5): qty 1

## 2020-10-01 MED ORDER — DILTIAZEM HCL ER COATED BEADS 180 MG PO CP24
180.0000 mg | ORAL_CAPSULE | Freq: Every day | ORAL | Status: DC
  Administered 2020-10-02 – 2020-10-06 (×5): 180 mg via ORAL
  Filled 2020-10-01 (×5): qty 1

## 2020-10-01 MED ORDER — METOPROLOL TARTRATE 50 MG PO TABS
50.0000 mg | ORAL_TABLET | Freq: Two times a day (BID) | ORAL | Status: DC
  Administered 2020-10-01 – 2020-10-06 (×10): 50 mg via ORAL
  Filled 2020-10-01: qty 1
  Filled 2020-10-01: qty 2
  Filled 2020-10-01 (×4): qty 1
  Filled 2020-10-01: qty 2
  Filled 2020-10-01 (×3): qty 1

## 2020-10-01 MED ORDER — TEMAZEPAM 15 MG PO CAPS
30.0000 mg | ORAL_CAPSULE | Freq: Every evening | ORAL | Status: DC | PRN
  Administered 2020-10-02 (×2): 30 mg via ORAL
  Administered 2020-10-03: 15 mg via ORAL
  Administered 2020-10-04 – 2020-10-05 (×2): 30 mg via ORAL
  Filled 2020-10-01 (×5): qty 2

## 2020-10-01 MED ORDER — SENNOSIDES-DOCUSATE SODIUM 8.6-50 MG PO TABS: 1.0000 | ORAL_TABLET | Freq: Every day | ORAL | Status: DC

## 2020-10-01 MED ORDER — POLYETHYLENE GLYCOL 3350 17 G PO PACK
17.0000 g | PACK | Freq: Every day | ORAL | Status: DC
  Administered 2020-10-03 – 2020-10-05 (×3): 17 g via ORAL
  Filled 2020-10-01 (×5): qty 1

## 2020-10-01 MED ORDER — PANTOPRAZOLE SODIUM 40 MG PO TBEC
40.0000 mg | DELAYED_RELEASE_TABLET | Freq: Every day | ORAL | Status: DC
  Administered 2020-10-02 – 2020-10-06 (×5): 40 mg via ORAL
  Filled 2020-10-01 (×5): qty 1

## 2020-10-01 MED ORDER — SULFAMETHOXAZOLE-TRIMETHOPRIM 400-80 MG PO TABS
1.0000 | ORAL_TABLET | Freq: Two times a day (BID) | ORAL | Status: DC
  Administered 2020-10-02 – 2020-10-06 (×9): 1 via ORAL
  Filled 2020-10-01 (×11): qty 1

## 2020-10-01 NOTE — ED Provider Notes (Signed)
[facility] Provider Note   CSN: [id number] Arrival date & time: 10/01/20  1507     History Chief Complaint  Patient presents with  . Hip Pain    [facility] is a 86 y.o. female.  The history is provided by the patient, a relative and medical records. No language interpreter was used.  Hip Pain     86 year old female significant history of left femoral fracture status post hip surgery 3 years ago by Dr. Erlinda Hong, hard of hearing, arthritis, prior hip and hardware infection presenting complaining of left hip pain.  History obtained through patient and through son who is at bedside.  For the past 3 weeks patient has had intermittent pain about her left hip radiating down to her left leg.  Pain is sharp throbbing achy worse with ambulation and weightbearing and improves with rest.  Pain is waxing and waning currently minimal but at times not adequately controlled despite taking Tylenol and ibuprofen.  No specific injury, no fever, no back pain, no bowel bladder incontinence or saddle anesthesia.  She use a walker to walk.  She denies any focal numbness or focal weakness.  She does have history of sciatica.  Past Medical History:  Diagnosis Date  . Anxiety attack    "take RX prn" (08/13/2013)  . Arthritis    "probably" (08/13/2013)  . Atrial fibrillation ([place])    new onset/pt report 08/13/2013  . Bronchitis 12/2011  . Complication of anesthesia   . Depression   . Diabetes mellitus type 2, diet-controlled ([place])    denies  . Femur fracture ([facility])    left proximal femur non-union  . GERD (gastroesophageal reflux disease)   . H/O hiatal hernia   . Hardware complicating wound infection ([place]) 08/22/2016  . History of blood transfusion    "w/both knee OR's" (08/13/2013)  . Hyperlipidemia   . Hyperlipidemia   . Hypertension   . Hypertensive urgency 10/22/2018  . Hypothyroidism    takes synthroid  . Insomnia   . Intertrochanteric fracture of right hip  ([place]) 07/30/2015  . Neuromuscular disorder ([place])    carpal tunnel in right hand  . Periprosthetic fracture around internal prosthetic right hip joint ([place]), nonunion basicervical femoral neck with broken hardware and subtrochanteric fracture 03/16/2016  . PONV (postoperative nausea and vomiting)   . 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 Proteus infection 08/22/2016  . Serratia infection 11/14/2016  . Skin cancer of face 2000's   "one on each side of my face and one on my nose" (08/13/2013)  . Swelling of extremity    sees Dr. Gwenlyn Found, cardiologist 1 x year [phone]  . Tendon dysfunction 08/22/2016  . Wears hearing aid    [facility]    Patient Active Problem List   Diagnosis Date Noted  . Hypertensive urgency 10/22/2018  . Chronic low back pain 12/21/2017  . Chronic antibiotic suppression 06/13/2017  . History of hip replacement 06/09/2017  . Ecchymosis 02/23/2017  . Closed intertrochanteric fracture with nonunion, left 01/26/2017  . Leukocytosis 01/26/2017  . Fall 01/26/2017  . 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 Tendinopathy 08/22/2016  . Clinical depression 04/07/2016  . Arthritis of knee, degenerative 04/07/2016  . OP (osteoporosis) 04/07/2016  . 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 Chronic anticoagulation 08/06/2015  . Bipolar disorder ([place]) 08/04/2015  . Hyponatremia 08/04/2015  . Constipation 08/04/2015  . Complication of anesthesia 07/30/2015  . Diabetes mellitus type 2, diet-controlled ([place])   . Skin  cancer of face   . Paroxysmal atrial fibrillation ([place]) 09/01/2014  . Right carotid bruit 11/18/2013  . Postoperative anemia due to acute blood loss 03/03/2012  . Hypertension   . Hyperlipidemia   . Swelling of extremity   . Cataract   . Hypothyroidism   . Arthritis   . Insomnia     Past Surgical History:  Procedure Laterality Date  . 2D ECHOCARDIOGRAM  12/07/2004   EF 48%, moderate pulmonary hypertension  . BREAST BIOPSY Left ?1970's  .  CARDIOVASCULAR STRESS TEST  12/07/11  . CARDIOVASCULAR STRESS TEST  12/07/2011   LEXISCAN, normal scan, no significant wall abnormalities noted  . CARPAL TUNNEL RELEASE Right ~ 2005  . CATARACT EXTRACTION, BILATERAL Bilateral 2000  . DILATION AND CURETTAGE OF UTERUS  1950's  . DOPPLER ECHOCARDIOGRAPHY  12/07/11  . EYE SURGERY    . FEMUR IM NAIL Right 08/01/2015   Procedure: INTRAMEDULLARY (IM) NAIL FEMORAL;  Surgeon: Marchia Bond, MD;  Location: [place];  Service: Orthopedics;  Laterality: Right;  . HARDWARE REMOVAL Right 03/16/2016   Procedure: HARDWARE REMOVAL;  Surgeon: Marchia Bond, MD;  Location: [place];  Service: Orthopedics;  Laterality: Right;  . INTRAMEDULLARY (IM) NAIL INTERTROCHANTERIC Left 01/26/2017   Procedure: INTRAMEDULLARY (IM) NAIL INTERTROCHANTRIC;  Surgeon: Leandrew Koyanagi, MD;  Location: [place];  Service: Orthopedics;  Laterality: Left;  . JOINT REPLACEMENT     bilateral knee replacement  . SKIN CANCER EXCISION  2000's   "off my nose" (08/13/2013)  . TOTAL HIP ARTHROPLASTY Right 03/16/2016   Procedure: TOTAL HIP ARTHROPLASTY;  Surgeon: Marchia Bond, MD;  Location: [place];  Service: Orthopedics;  Laterality: Right;  . TOTAL HIP ARTHROPLASTY Left 06/09/2017   Procedure: REMOVE LEFT HIP INTRAMEDULLARY NAIL AND CONVERT TO LEFT CEMENTED HEMI HIP ARTHROPLASTY;  Surgeon: Leandrew Koyanagi, MD;  Location: [place];  Service: Orthopedics;  Laterality: Left;  . TOTAL KNEE ARTHROPLASTY Right 2005  . TOTAL KNEE ARTHROPLASTY  03/02/2012   Procedure: TOTAL KNEE ARTHROPLASTY;  Surgeon: Yvette Rack., MD;  Location: [place];  Service: Orthopedics;  Laterality: Left;     OB History   No obstetric history on file.     Family History  Problem Relation Age of Onset  . Arrhythmia Mother   . Heart disease Mother   . Arrhythmia Sister   . Stroke Father 82  . Heart disease Brother   . Heart disease Brother   . Cancer Brother        Colon cancer  . Arrhythmia Brother   . Heart disease Brother   .  Heart disease Sister   . Heart disease Sister   . Stroke Sister   . Diabetes Sister   . Heart disease Child 40  . Heart disease Child 62    Social History   Tobacco Use  . Smoking status: Never Smoker  . Smokeless tobacco: Never Used  Substance Use Topics  . Alcohol use: No  . Drug use: No    Home Medications Prior to Admission medications   Medication Sig Start Date End Date Taking? Authorizing Provider  acetaminophen (TYLENOL) 325 MG tablet Take 650 mg by mouth every 6 (six) hours as needed for moderate pain.     [provider]  calcium carbonate (OS-CAL - DOSED IN MG OF ELEMENTAL CALCIUM) 1250 (500 Ca) MG tablet Take 1 tablet by mouth every evening.    [provider]  chlorthalidone (HYGROTON) 25 MG tablet TAKE 1 TABLET ONCE DAILY.  08/06/20   Lorretta Harp, MD  Cholecalciferol 2000 units CAPS Take 2,000 Units by mouth every evening.    [provider]  denosumab (PROLIA) 60 MG/ML SOLN injection Inject 60 mg into the skin every 6 (six) months. Administer in upper arm, thigh, or abdomen    [provider]  diltiazem (CARDIZEM CD) 180 MG 24 hr capsule TAKE 1 CAPSULE DAILY. 06/03/20   Lorretta Harp, MD  diltiazem [facility]) 120 MG 24 hr capsule 180 mg. 08/14/13   [provider]  DULoxetine (CYMBALTA) 60 MG capsule Take 60 mg by mouth daily.     [provider]  ELIQUIS 2.5 MG TABS tablet TAKE 1 TABLET BY MOUTH TWICE DAILY. 07/23/20   Lorretta Harp, MD  esomeprazole (NEXIUM) 20 MG capsule Take 20 mg by mouth daily at 12 noon.    [provider]  feeding supplement (BOOST HIGH PROTEIN) LIQD Take 237 mLs by mouth 3 (three) times daily between meals. Prefers chocolate diabetic and peanut butter crackers before bedtime.    [provider]  Flaxseed, Linseed, (FLAXSEED OIL) 1000 MG CAPS Take 1,000 mg by mouth 2 (two) times daily.     [provider]  furosemide (LASIX) 20 MG tablet Take 1 tablet (20 mg  total) by mouth as needed for edema. 02/12/19   Lorretta Harp, MD  gabapentin (NEURONTIN) 300 MG capsule TAKE (1) CAPSULE THREE TIMES DAILY. 05/29/20   Magnus Sinning, MD  levothyroxine (SYNTHROID, LEVOTHROID) 150 MCG tablet Take 150 mcg by mouth daily before breakfast.    [provider]  metoprolol (LOPRESSOR) 50 MG tablet Take 50 mg by mouth 2 (two) times daily.    [provider]  Multiple Vitamins-Minerals (MULTIVITAMIN) tablet Take 1 tablet by mouth 2 (two) times daily.     [provider]  polyethylene glycol (MIRALAX / GLYCOLAX) packet Take 17 g by mouth daily. 01/30/17   Lavina Hamman, MD  potassium chloride (K-DUR) 10 MEQ tablet Take 1 tablet (10 mEq total) by mouth as needed. Take one tablet as needed when giving lasix as needed 02/12/19   Lorretta Harp, MD  saccharomyces boulardii (FLORASTOR) 250 MG capsule Take 250 mg by mouth 2 (two) times daily.    [provider]  senna-docusate (SENOKOT S) 8.6-50 MG tablet Take 1 tablet by mouth at bedtime as needed. 06/12/17   Leandrew Koyanagi, MD  sulfamethoxazole-trimethoprim (BACTRIM) 400-80 MG tablet Take 1 tablet by mouth 2 (two) times daily. 10/23/19   Truman Hayward, MD  temazepam (RESTORIL) 30 MG capsule Take 1 capsule (30 mg total) by mouth at bedtime as needed for sleep. 01/30/17   Lavina Hamman, MD    Allergies    Levaquin [levofloxacin] and Morphine and related  Review of Systems   Review of Systems  Constitutional: Negative for fever.  Musculoskeletal: Positive for arthralgias.  All other systems reviewed and are negative.   Physical Exam Updated Vital Signs BP (!) 177/92 (BP Location: Right Arm)   Pulse 86   Temp 97.8 F (36.6 C) (Oral)   Resp 16   Ht 5\' 3"  (1.6 m)   Wt 59 kg   SpO2 100%   BMI 23.03 kg/m   Physical Exam Vitals and nursing note reviewed.  Constitutional:      General: She is not in acute distress.    Appearance: She is well-developed.     Comments:  Hard of hearing.  HENT:  Head: Atraumatic.  Eyes:     Conjunctiva/sclera: Conjunctivae normal.  Musculoskeletal:        General: Tenderness (Tenderness to lateral hip on palpation with normal hip flexion extension abduction abduction and no deformity noted.  No overlying skin changes.  Positive straight leg raise, intact pedal pulse.) present.     Cervical back: Neck supple.     Comments: No significant midline spine tenderness crepitus or step-off.  Skin:    Findings: No rash.  Neurological:     Mental Status: She is alert.  Psychiatric:        Mood and Affect: Mood normal.     ED Results / Procedures / Treatments   Labs (all labs ordered are listed, but only abnormal results are displayed) Labs Reviewed  CBC WITH DIFFERENTIAL/PLATELET - Abnormal; Notable for the following components:      Result Value   Hemoglobin 15.6 (*)    HCT 46.6 (*)    All other components within normal limits  I-STAT CHEM 8, ED - Abnormal; Notable for the following components:   Potassium 3.4 (*)    Chloride 95 (*)    Glucose, Bld 128 (*)    Hemoglobin 16.7 (*)    HCT 49.0 (*)    All other components within normal limits  RESPIRATORY PANEL BY RT PCR (FLU A&B, COVID)    EKG None  Radiology DG Hip Unilat W or Wo Pelvis 2-3 Views Left  Result Date: 10/01/2020 CLINICAL DATA:  Pain EXAM: DG HIP (WITH OR WITHOUT PELVIS) 2-3V LEFT COMPARISON:  July 21, 2017 FINDINGS: Frontal pelvis as well as frontal and lateral left hip images were obtained. Patient is status post total hip replacements bilaterally with prosthetic components bilaterally well seated. No acute fracture or dislocation evident. Chronic avulsion of the greater and lesser trochanters on the left with remodeling. Bony overgrowth along the greater and lesser trochanters on the right, stable. No erosive change. Bones osteoporotic. Multiple foci of arterial atherosclerotic calcification noted. IMPRESSION: Total hip replacements with  prosthetic components bilaterally well-seated. Bony remodeling in both proximal femur regions, stable. Underlying osteoporosis. No acute fracture or dislocation. Multiple foci of arterial vascular calcification noted. Electronically Signed   By: Lowella Grip III M.D.   On: 10/01/2020 16:15    Procedures Procedures (including critical care time)  Medications Ordered in ED Medications - No data to display  ED Course  I have reviewed the triage vital signs and the nursing notes.  Pertinent labs & imaging results that were available during my care of the patient were reviewed by me and considered in my medical decision making (see chart for details).    MDM Rules/Calculators/A&P                          BP (!) 191/96 (BP Location: Left Arm)   Pulse 72   Temp 98.3 F (36.8 C) (Oral)   Resp 17   Ht 5\' 3"  (1.6 m)   Wt 59 kg   SpO2 93%   BMI 23.03 kg/m   Final Clinical Impression(s) / ED Diagnoses Final diagnoses:  Closed nondisplaced fracture of proximal epiphysis of left femur, initial encounter ([place])    Rx / DC Orders ED Discharge Orders    None     5:21 PM Patient reported having intermittent left hip pain worse with ambulation.  Pain radiates down her left leg.  She has history of sciatica.  She also has history of total  hip replacement . initial x-ray of her left hip did not show any acute finding.  Given her age, will obtain CT scan of the hip for better visualization.  Although patient has had a history of hip infection related to her hardware in the past, she denies having any fever or changes in skin to suggest infection.  Examining her veins are unremarkable.  She is currently afebrile.  She is hypertensive with blood pressure 191/96 likely secondary to pain and underlying hypertension.  Care discussed with Dr. Sherry Ruffing.   7:46 PM CT scan of the left hip demonstrate new longitudinal nondisplaced fracture in the medial periprosthetic regions of the proximal left  femur which is most likely is acute or subacute.  Given this finding, orthopedist, Dr. Erlinda Hong was consulted who request medicine for admission and his team will see patient in the morning.  9:53 PM Appreciate consultation with [facility] Dr. Tobie Poet who agrees to see and will admit pt for further care.  Screening covid-19 test ordered.     Domenic Moras, PA-C 10/01/20 2153    Tegeler, Gwenyth Allegra, MD 10/01/20 [phone]

## 2020-10-01 NOTE — H&P (Signed)
History and Physical   Leah Holland BPZ:[id number] DOB: 06/13/1922 DOA: 10/01/2020  PCP: [name], MD  Patient coming from: home  I have personally briefly reviewed patient's old medical records in [place].  Chief Concern: left lower extremity pain  HPI: Leah Holland is a 86 y.o. female with medical history significant for hypertension, hyperlipidemia, as well as family history of heart disease, remote right total knee replacement, recent left knee replacement by Dr. 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  Leah Holland reports that today her son picked her up from retirement home to get her hair done.  After getting her hair done, she was walking and noticed that her left lower extremity was hurting.  She denies trauma, accidents, and hitting her leg anywhere.  She endorses baseline sciatica in that leg.  She reports that the pain was so severe that she had trouble walking.  Her son advised her to go to healthcare facility for evaluation.  She presented to the ED and the emergency department provider ordered a unilateral hip with or without pelvis 2-3 view and a CT left hip without contrast.  The unilateral hip x-ray showed total hip replacements with prosthetic components bilaterally well-seated.  Bony remodeling in both proximal femur regions, stable.  Underlying osteoporosis.  No acute fracture or dislocation.  Multiple foci of arterial vascular calcification noted.  CT of the hip read as status post left total hip arthroplasty for treatment of old proximal left femur trochanteric fracture.  There appears to be a new longitudinal nondisplaced fracture in the medial periprosthetic region of the proximal left femur likely acute or subacute.  At bedside, review of system was negative for headache, vision  changes, nausea, vomiting, fever, chills, cough, chest pain, shortness of breath, abdominal pain, constipation, diarrhea, dysuria, hematuria, skin lesions.  Review of system was positive for left lower extremity muscular skeletal pain with sciatica and negative for numbness and tingling.  She denies urinary and/or bowel incontinence.  ED Course: Discussed with ED provider.  ED provider has consulted orthopedic for the longitudinal nondisplaced fracture.  And per ED provider orthopedic surgeon wants medicine to admit and they will follow in the a.m.  I evaluated Leah Holland at bedside.  Her vitals in the emergency department was stable afebrile and on room air.  Labs were remarkable for hemoglobin of 16.7, hematocrit of 49, no leukocytosis, and mild hypokalemia of 3.4, chloride 95.  Review of Systems: As per HPI otherwise 10 point review of systems negative.   Past Medical History:  Diagnosis Date  . Anxiety attack    "take RX prn" (08/13/2013)  . Arthritis    "probably" (08/13/2013)  . Atrial fibrillation ([place])    new onset/pt report 08/13/2013  . Bronchitis 12/2011  . Complication of anesthesia   . Depression   . Diabetes mellitus type 2, diet-controlled ([place])    denies  . Femur fracture (HCC)    left proximal femur non-union  . GERD (gastroesophageal reflux disease)   . H/O hiatal hernia   . Hardware complicating wound infection ([place]) 08/22/2016  . History of blood transfusion    "w/both knee OR's" (08/13/2013)  . Hyperlipidemia   . Hyperlipidemia   . Hypertension   . Hypertensive urgency 10/22/2018  . Hypothyroidism    takes synthroid  . Insomnia   . Intertrochanteric fracture of  right hip ([place]) 07/30/2015  . Neuromuscular disorder ([place])    carpal tunnel in right hand  . Periprosthetic fracture around internal prosthetic right hip joint ([place]), nonunion basicervical femoral neck with broken hardware and subtrochanteric fracture 03/16/2016  . PONV (postoperative nausea and vomiting)   .  Prosthetic hip infection ([place]) 08/22/2016  . Proteus infection 08/22/2016  . Serratia infection 11/14/2016  . Skin cancer of face 2000's   "one on each side of my face and one on my nose" (08/13/2013)  . Swelling of extremity    sees Dr. Gwenlyn Found, cardiologist 1 x year [phone]  . Tendon dysfunction 08/22/2016  . Wears hearing aid    [facility]   Past Surgical History:  Procedure Laterality Date  . 2D ECHOCARDIOGRAM  12/07/2004   EF 48%, moderate pulmonary hypertension  . BREAST BIOPSY Left ?1970's  . CARDIOVASCULAR STRESS TEST  12/07/11  . CARDIOVASCULAR STRESS TEST  12/07/2011   LEXISCAN, normal scan, no significant wall abnormalities noted  . CARPAL TUNNEL RELEASE Right ~ 2005  . CATARACT EXTRACTION, BILATERAL Bilateral 2000  . DILATION AND CURETTAGE OF UTERUS  1950's  . DOPPLER ECHOCARDIOGRAPHY  12/07/11  . EYE SURGERY    . FEMUR IM NAIL Right 08/01/2015   Procedure: INTRAMEDULLARY (IM) NAIL FEMORAL;  Surgeon: Marchia Bond, MD;  Location: [place];  Service: Orthopedics;  Laterality: Right;  . HARDWARE REMOVAL Right 03/16/2016   Procedure: HARDWARE REMOVAL;  Surgeon: Marchia Bond, MD;  Location: [place];  Service: Orthopedics;  Laterality: Right;  . INTRAMEDULLARY (IM) NAIL INTERTROCHANTERIC Left 01/26/2017   Procedure: INTRAMEDULLARY (IM) NAIL INTERTROCHANTRIC;  Surgeon: Leandrew Koyanagi, MD;  Location: [place];  Service: Orthopedics;  Laterality: Left;  . JOINT REPLACEMENT     bilateral knee replacement  . SKIN CANCER EXCISION  2000's   "off my nose" (08/13/2013)  . TOTAL HIP ARTHROPLASTY Right 03/16/2016   Procedure: TOTAL HIP ARTHROPLASTY;  Surgeon: Marchia Bond, MD;  Location: [place];  Service: Orthopedics;  Laterality: Right;  . TOTAL HIP ARTHROPLASTY Left 06/09/2017   Procedure: REMOVE LEFT HIP INTRAMEDULLARY NAIL AND CONVERT TO LEFT CEMENTED HEMI HIP ARTHROPLASTY;  Surgeon: Leandrew Koyanagi, MD;  Location: [place];  Service: Orthopedics;  Laterality: Left;  . TOTAL KNEE ARTHROPLASTY Right 2005  .  TOTAL KNEE ARTHROPLASTY  03/02/2012   Procedure: TOTAL KNEE ARTHROPLASTY;  Surgeon: Yvette Rack., MD;  Location: [place];  Service: Orthopedics;  Laterality: Left;   Social History:  reports that she has never smoked. She has never used smokeless tobacco. She reports that she does not drink alcohol and does not use drugs.  Allergies  Allergen Reactions  . Levaquin [Levofloxacin] Other (See Comments)    tendinopathy  . Morphine And Related Other (See Comments)    Made pt "wild"   Family History  Problem Relation Age of Onset  . Arrhythmia Mother   . Heart disease Mother   . Arrhythmia Sister   . Stroke Father 59  . Heart disease Brother   . Heart disease Brother   . Cancer Brother        Colon cancer  . Arrhythmia Brother   . Heart disease Brother   . Heart disease Sister   . Heart disease Sister   . Stroke Sister   . Diabetes Sister   . Heart disease Child 66  . Heart disease Child 63   Family history: Family history reviewed and not pertinent.  Prior to Admission medications   Medication Sig  Start Date End Date Taking? Authorizing Provider  acetaminophen (TYLENOL) 325 MG tablet Take 650 mg by mouth every 6 (six) hours as needed for moderate pain.     [provider]  calcium carbonate (OS-CAL - DOSED IN MG OF ELEMENTAL CALCIUM) 1250 (500 Ca) MG tablet Take 1 tablet by mouth every evening.    [provider]  chlorthalidone (HYGROTON) 25 MG tablet TAKE 1 TABLET ONCE DAILY. 08/06/20   Lorretta Harp, MD  Cholecalciferol 2000 units CAPS Take 2,000 Units by mouth every evening.    [provider]  denosumab (PROLIA) 60 MG/ML SOLN injection Inject 60 mg into the skin every 6 (six) months. Administer in upper arm, thigh, or abdomen    [provider]  diltiazem (CARDIZEM CD) 180 MG 24 hr capsule TAKE 1 CAPSULE DAILY. 06/03/20   Lorretta Harp, MD  diltiazem [facility]) 120 MG 24 hr capsule 180 mg. 08/14/13   [provider]  DULoxetine  (CYMBALTA) 60 MG capsule Take 60 mg by mouth daily.     [provider]  ELIQUIS 2.5 MG TABS tablet TAKE 1 TABLET BY MOUTH TWICE DAILY. 07/23/20   Lorretta Harp, MD  esomeprazole (NEXIUM) 20 MG capsule Take 20 mg by mouth daily at 12 noon.    [provider]  feeding supplement (BOOST HIGH PROTEIN) LIQD Take 237 mLs by mouth 3 (three) times daily between meals. Prefers chocolate diabetic and peanut butter crackers before bedtime.    [provider]  Flaxseed, Linseed, (FLAXSEED OIL) 1000 MG CAPS Take 1,000 mg by mouth 2 (two) times daily.     [provider]  furosemide (LASIX) 20 MG tablet Take 1 tablet (20 mg total) by mouth as needed for edema. 02/12/19   Lorretta Harp, MD  gabapentin (NEURONTIN) 300 MG capsule TAKE (1) CAPSULE THREE TIMES DAILY. 05/29/20   Magnus Sinning, MD  levothyroxine (SYNTHROID, LEVOTHROID) 150 MCG tablet Take 150 mcg by mouth daily before breakfast.    [provider]  metoprolol (LOPRESSOR) 50 MG tablet Take 50 mg by mouth 2 (two) times daily.    [provider]  Multiple Vitamins-Minerals (MULTIVITAMIN) tablet Take 1 tablet by mouth 2 (two) times daily.     [provider]  polyethylene glycol (MIRALAX / GLYCOLAX) packet Take 17 g by mouth daily. 01/30/17   Lavina Hamman, MD  potassium chloride (K-DUR) 10 MEQ tablet Take 1 tablet (10 mEq total) by mouth as needed. Take one tablet as needed when giving lasix as needed 02/12/19   Lorretta Harp, MD  saccharomyces boulardii (FLORASTOR) 250 MG capsule Take 250 mg by mouth 2 (two) times daily.    [provider]  senna-docusate (SENOKOT S) 8.6-50 MG tablet Take 1 tablet by mouth at bedtime as needed. 06/12/17   Leandrew Koyanagi, MD  sulfamethoxazole-trimethoprim (BACTRIM) 400-80 MG tablet Take 1 tablet by mouth 2 (two) times daily. 10/23/19   Truman Hayward, MD  temazepam (RESTORIL) 30 MG capsule Take 1 capsule (30 mg total) by mouth at bedtime as  needed for sleep. 01/30/17   Lavina Hamman, MD   Physical Exam: Vitals:   10/01/20 1534 10/01/20 1548 10/01/20 1901 10/01/20 2318  BP: (!) 177/92  (!) 191/96 (!) 152/78  Pulse: 86  72 68  Resp: 16  17 20   Temp: 97.8 F (36.6 C)  98.3 F (36.8 C) 98.4 F (36.9 C)  TempSrc: Oral  Oral Oral  SpO2: 100%  93% 94%  Weight: 59 kg 59 kg    Height: 5\' 3"  (1.6 m)      Constitutional: NAD, calm, comfortable Eyes: PERRL, lids and conjunctivae normal ENMT: Mucous membranes are moist. Posterior pharynx clear of any exudate or lesions.Normal dentition.  Neck: normal, supple, no masses, no thyromegaly Respiratory: clear to auscultation bilaterally, no wheezing, no crackles. Normal respiratory effort. No accessory muscle use.  Cardiovascular: Regular rate and rhythm, no murmurs / rubs / gallops. No extremity edema. 2+ pedal pulses. No carotid bruits.  Abdomen: no tenderness, no masses palpated. No hepatosplenomegaly. Bowel sounds positive.  Musculoskeletal: no clubbing / cyanosis.  Decreased range of motion with flexion extension of the hip and knee of the left lower extremity. no contractures bilaterally. Normal muscle tone.  Skin: no rashes, lesions, ulcers. No induration Neurologic: CN 2-12 grossly intact. Sensation intact. Strength 5/5 in all 4.  Psychiatric: Normal judgment and insight. Alert and oriented x 3. Normal mood.   Labs on Admission: I have personally reviewed following labs and imaging studies  CBC: Recent Labs  Lab 10/01/20 2104 10/01/20 2117  WBC 10.4  --   NEUTROABS 7.3  --   HGB 15.6* 16.7*  HCT 46.6* 49.0*  MCV 93.2  --   PLT 251  --    Basic Metabolic Panel: Recent Labs  Lab 10/01/20 2117  NA 135  K 3.4*  CL 95*  GLUCOSE 128*  BUN 15  CREATININE 0.90   Urine analysis:    Component Value Date/Time   COLORURINE YELLOW 01/26/2017 0142   APPEARANCEUR CLEAR 01/26/2017 0142   LABSPEC 1.017 01/26/2017 0142   PHURINE 5.0 01/26/2017 0142   GLUCOSEU NEGATIVE  01/26/2017 0142   HGBUR NEGATIVE 01/26/2017 0142   BILIRUBINUR NEGATIVE 01/26/2017 0142   KETONESUR NEGATIVE 01/26/2017 0142   PROTEINUR NEGATIVE 01/26/2017 0142   UROBILINOGEN 0.2 08/02/2015 0909   NITRITE NEGATIVE 01/26/2017 0142   LEUKOCYTESUR NEGATIVE 01/26/2017 0142   Radiological Exams on Admission: Personally reviewed and I agree with radiologist reading as below.  CT Hip Left Wo Contrast  Result Date: 10/01/2020 CLINICAL DATA:  Left hip pain.  Possible stress fracture. EXAM: CT OF THE LEFT HIP WITHOUT CONTRAST TECHNIQUE: Multidetector CT imaging of the left hip was performed according to the standard protocol. Multiplanar CT image reconstructions were also generated. COMPARISON:  Radiograph of same day.  CT of June 06, 2017. FINDINGS: Status post left total hip arthroplasty for treatment of old proximal left femoral trochanteric fracture. This exam is significantly limited due to scatter artifact arising from the hip prosthesis. There appears to be a new longitudinal nondisplaced fracture in the medial periprosthetic region of the proximal left femur which most likely is acute or subacute. IMPRESSION: Status post left total hip arthroplasty for treatment of old proximal left femoral trochanteric fracture. There appears to be a new longitudinal nondisplaced fracture in the medial periprosthetic region of the proximal left femur which most likely is acute or subacute. Electronically Signed   By: Marijo Conception M.D.   On: 10/01/2020 19:17   DG Hip Unilat W or Wo Pelvis 2-3 Views Left  Result Date: 10/01/2020 CLINICAL DATA:  Pain EXAM: DG HIP (WITH OR WITHOUT PELVIS) 2-3V LEFT COMPARISON:  July 21, 2017 FINDINGS: Frontal pelvis as well as frontal and lateral left hip images were obtained. Patient is status post total hip replacements bilaterally with prosthetic components bilaterally well seated. No acute fracture or dislocation evident. Chronic avulsion of the greater and lesser  trochanters on the left with remodeling. Bony overgrowth along the greater and lesser trochanters on the right, stable. No erosive change. Bones osteoporotic. Multiple foci of arterial atherosclerotic calcification noted. IMPRESSION: Total hip replacements with prosthetic components bilaterally well-seated. Bony remodeling in both proximal femur regions, stable. Underlying osteoporosis. No acute fracture or dislocation. Multiple foci of arterial vascular calcification noted. Electronically Signed   By: Lowella Grip III M.D.   On: 10/01/2020 16:15   EKG:  not done in the ED  Assessment/Plan  Principal Problem:   Leg fracture, left, closed, initial encounter Active Problems:   Hypertension   Hyperlipidemia   Hypothyroidism   Insomnia   Paroxysmal atrial fibrillation (HCC)   Chronic anticoagulation   Clinical depression   Chronic antibiotic suppression   # Left longitudinal fracture suspect acute - Orthopedic service has been consulted by ED provider and requested medicine admit patient - Hold Eliquis - Admit patient with telemetry - Check TSH - Appreciate orthopedic evaluation - N.p.o. after midnight  # Paroxysmal A. fib on anticoagulation-holding home Eliquis pending orthopedic surgeon evaluation - resumed home diltiazem xr 180 mg daily, metoprolol tartrate 50 mg bid - Follows with cardiology outpatient - Diagnosed with PAF in 2014 - EKG ordered  - Per cardiology, Leah Holland does not have history of MI   # hypertension - resumed home antihypertensive - Chlorthalidone, diltiazem 180 mg daily, metoprolol 50 mg BID,   # Chronic hip prosthesis infection # On chronic abx suppression  - Review of tissue culture from deep femur in 03/16/2016 showed positive for few serratia marcescens - Continue tmp-smx 400-80 BID  # hypothyroid - per Leah Holland, resumed 112 mcg qAM  DVT prophylaxis: holding home eliquis Code Status: full code Diet: npo Family Communication: not at this  time, patient request AM update with family Disposition Plan: pending orthopedic surgeon evaluation Consults called: ortho by ED provider Admission status: obs with telemetry  Leah Holland N Lurleen Soltero D.O. [facility]  If 7AM-7PM, please contact day-coverage provider [email or web address]  10/02/2020, 12:43 AM

## 2020-10-01 NOTE — ED Triage Notes (Addendum)
Pt is here for pain in left  hip.  Pt has hx of bilateral hip replacement and states that she has been having increasing left hip pain not associated with any trauma, pt states that it got too bad today and was unrelieved with ibuprofen.

## 2020-10-02 DIAGNOSIS — S8292XA Unspecified fracture of left lower leg, initial encounter for closed fracture: Secondary | ICD-10-CM | POA: Diagnosis not present

## 2020-10-02 LAB — CBC
HCT: 42.4 % (ref 36.0–46.0)
Hemoglobin: 14 g/dL (ref 12.0–15.0)
MCH: 30.8 pg (ref 26.0–34.0)
MCHC: 33 g/dL (ref 30.0–36.0)
MCV: 93.2 fL (ref 80.0–100.0)
Platelets: 238 10*3/uL (ref 150–400)
RBC: 4.55 MIL/uL (ref 3.87–5.11)
RDW: 14.1 % (ref 11.5–15.5)
WBC: 9.7 10*3/uL (ref 4.0–10.5)
nRBC: 0 % (ref 0.0–0.2)

## 2020-10-02 LAB — BASIC METABOLIC PANEL
Anion gap: 10 (ref 5–15)
BUN: 10 mg/dL (ref 8–23)
CO2: 26 mmol/L (ref 22–32)
Calcium: 9.6 mg/dL (ref 8.9–10.3)
Chloride: 97 mmol/L — ABNORMAL LOW (ref 98–111)
Creatinine, Ser: 0.8 mg/dL (ref 0.44–1.00)
GFR, Estimated: >60 mL/min (ref >60)
Glucose, Bld: 126 mg/dL — ABNORMAL HIGH (ref 70–99)
Potassium: 3.7 mmol/L (ref 3.5–5.1)
Sodium: 133 mmol/L — ABNORMAL LOW (ref 135–145)

## 2020-10-02 LAB — TSH: TSH: 1.197 u[IU]/mL (ref 0.350–4.500)

## 2020-10-02 MED ORDER — POTASSIUM CHLORIDE 20 MEQ PO PACK
40.0000 meq | PACK | Freq: Every day | ORAL | Status: AC
  Administered 2020-10-02 – 2020-10-03 (×2): 40 meq via ORAL
  Filled 2020-10-02 (×2): qty 2

## 2020-10-02 MED ORDER — APIXABAN 2.5 MG PO TABS
2.5000 mg | ORAL_TABLET | Freq: Two times a day (BID) | ORAL | Status: DC
  Administered 2020-10-02 – 2020-10-06 (×9): 2.5 mg via ORAL
  Filled 2020-10-02 (×10): qty 1

## 2020-10-02 MED ORDER — LACTATED RINGERS IV SOLN: INTRAVENOUS | Status: AC

## 2020-10-02 NOTE — Progress Notes (Signed)
Occupational Therapy Evaluation Patient Details Name: Leah Holland MRN: [id number] DOB: Jun 18, 1922 Today's Date: 10/02/2020    History of Present Illness 86 y.o. female with medical history significant for hypertension, hyperlipidemia, as well as family history of heart disease, remote B total knee replacement atrial fibrillation on Eliquis, chronic hip prosthesis infection on chronic abx suppression, presents for chief concern of left lower extremity throbbing pain with baseline sciatica negative for numbness. CT of the hip read as status post left total hip arthroplasty for treatment of old proximal left femur trochanteric fracture.  There appears to be a new longitudinal nondisplaced fracture in the medial periprosthetic region of the proximal left femur likely acute or subacute.    Clinical Impression   PTA, pt lives in [place] at [facility] and was modified independent @ RW level. Pt is very active. Pt had a recent fall and states she has had more problems with her balance. Given her NWB status LLE and her balance deficits, she is at high risk for falls. Feel pt would benefit from short term rehab at SNF level at [facility] to maximize safety and independence with mobility and ADL. Discussed probability that pt would most likely need ALF after rehab. Pt/son verbalized understanding. CM/SW notified. Will follow acutely.     Follow Up Recommendations  SNF;Supervision/Assistance - 24 hour    Equipment Recommendations  None recommended by OT    Recommendations for Other Services       Precautions / Restrictions Precautions Precautions: Fall Restrictions Weight Bearing Restrictions: Yes LLE Weight Bearing: Non weight bearing      Mobility Bed Mobility Overal bed mobility: Needs Assistance Bed Mobility: Supine to Sit;Sit to Supine     Supine to sit: Supervision Sit to supine: Supervision        Transfers Overall transfer level: Needs  assistance Equipment used: Rolling walker (2 wheeled) Transfers: Sit to/from Stand Sit to Stand: Min assist              Balance Overall balance assessment: Needs assistance;History of Falls   Sitting balance-Leahy Scale: Good       Standing balance-Leahy Scale: Poor                             ADL either performed or assessed with clinical judgement   ADL Overall ADL's : Needs assistance/impaired     Grooming: Set up;Sitting   Upper Body Bathing: Set up;Sitting   Lower Body Bathing: Sit to/from stand;Moderate assistance   Upper Body Dressing : Set up;Sitting   Lower Body Dressing: Moderate assistance;Sit to/from stand   Toilet Transfer: Minimal assistance;Ambulation;BSC (took @ 5 steps to [facility])   Toileting- Water quality scientist and Hygiene: Min guard;Sitting/lateral lean       Functional mobility during ADLs: Minimal assistance;+2 for safety/equipment;Cueing for safety;Cueing for sequencing;Rolling walker       Vision Baseline Vision/History: Wears glasses       Perception     Praxis      Pertinent Vitals/Pain Pain Assessment: Faces Faces Pain Scale: Hurts a little bit (increased pain with weight bearing) Pain Location:  L hip Pain Descriptors / Indicators: Discomfort;Guarding Pain Intervention(s): Limited activity within patient's tolerance     Hand Dominance Right   Extremity/Trunk Assessment Upper Extremity Assessment Upper Extremity Assessment: Overall WFL for tasks assessed   Lower Extremity Assessment Lower Extremity Assessment: Defer to PT evaluation   Cervical / Trunk Assessment Cervical / Trunk Assessment:  Normal   Communication Communication Communication: HOH (reads lips as he does not have her hearding aids with her)   Cognition Arousal/Alertness: Awake/alert Behavior During Therapy: WFL for tasks assessed/performed Overall Cognitive Status: Within Functional Limits for tasks assessed                                      General Comments       Exercises Exercises: Other exercises Other Exercises Other Exercises: encouraged BUEAROM through full range several times a day   Shoulder Instructions      Home Living Family/patient expects to be discharged to:: Skilled nursing facility                                        Prior Functioning/Environment Level of Independence: Independent with assistive device(s)        Comments: used RW        OT Problem List: Decreased strength;Decreased activity tolerance;Impaired balance (sitting and/or standing);Decreased knowledge of use of DME or AE;Decreased knowledge of precautions;Pain      OT Treatment/Interventions: Self-care/ADL training;Therapeutic exercise;DME and/or AE instruction;Therapeutic activities;Patient/family education;Balance training    OT Goals(Current goals can be found in the care plan section) Acute Rehab OT Goals Patient Stated Goal: To return to being independent OT Goal Formulation: With patient/family Time For Goal Achievement: 10/16/20 Potential to Achieve Goals: Good  OT Frequency: Min 2X/week   Barriers to D/C:            Co-evaluation PT/OT/SLP Co-Evaluation/Treatment: Yes Reason for Co-Treatment: To address functional/ADL transfers   OT goals addressed during session: ADL's and self-care      AM-PAC OT "6 Clicks" Daily Activity     Outcome Measure Help from another person eating meals?: None Help from another person taking care of personal grooming?: A Little Help from another person toileting, which includes using toliet, bedpan, or urinal?: A Little Help from another person bathing (including washing, rinsing, drying)?: A Lot Help from another person to put on and taking off regular upper body clothing?: A Little Help from another person to put on and taking off regular lower body clothing?: A Lot 6 Click Score: 17   End of Session Equipment Utilized During Treatment:  Gait belt;Rolling walker Nurse Communication: Mobility status;Weight bearing status  Activity Tolerance: Patient tolerated treatment well Patient left: in bed;with call bell/phone within reach;with family/visitor present  OT Visit Diagnosis: Unsteadiness on feet (R26.81);Other abnormalities of gait and mobility (R26.89);History of falling (Z91.81);Pain Pain - Right/Left: Left Pain - part of body: Leg                Time: 1152-1227 OT Time Calculation (min): 35 min Charges:  OT General Charges $OT Visit: 1 Visit OT Evaluation $OT Eval Low Complexity: [place], OT/L   Acute OT Clinical Specialist [place] Pager [phone] Office [phone]   [facility] 10/02/2020, 12:55 PM

## 2020-10-02 NOTE — Progress Notes (Signed)
CSW spoke with Jordan from [facility] @[phone].   Pt is a resident of [place], but there are no SNF beds available at that location. [place] will have a bed available for SNF on Monday and will be expecting to accept the Pt upon discharge.  Facility will still require PASRR , FL2 and Covid test. EDCSW signing off since Pt is slated to be admitted.

## 2020-10-02 NOTE — Consult Note (Signed)
ORTHOPAEDIC CONSULTATION  REQUESTING PHYSICIAN: Barb Merino, MD  Chief Complaint: Left periprosthetic femur fracture  HPI: Leah Holland is a 86 y.o. female who presents with worsening left thigh pain especially with weight bearing in the last 2-3 weeks.  She denies any injuries or trauma.  She is 3 years s/p coversion of left intertrochanteric nonunion to a cemented hip arthroplasty.  She recovered well from this surgery and did not have any pain until 2-3 weeks.  She lives in assisted living at [facility].  Ortho consulted.    Past Medical History:  Diagnosis Date  . Anxiety attack    "take RX prn" (08/13/2013)  . Arthritis    "probably" (08/13/2013)  . Atrial fibrillation ([place])    new onset/pt report 08/13/2013  . Bronchitis 12/2011  . Complication of anesthesia   . Depression   . Diabetes mellitus type 2, diet-controlled ([place])    denies  . Femur fracture ([facility])    left proximal femur non-union  . GERD (gastroesophageal reflux disease)   . H/O hiatal hernia   . Hardware complicating wound infection ([place]) 08/22/2016  . History of blood transfusion    "w/both knee OR's" (08/13/2013)  . Hyperlipidemia   . Hyperlipidemia   . Hypertension   . Hypertensive urgency 10/22/2018  . Hypothyroidism    takes synthroid  . Insomnia   . Intertrochanteric fracture of right hip ([place]) 07/30/2015  . Neuromuscular disorder ([place])    carpal tunnel in right hand  . Periprosthetic fracture around internal prosthetic right hip joint ([place]), nonunion basicervical femoral neck with broken hardware and subtrochanteric fracture 03/16/2016  . PONV (postoperative nausea and vomiting)   . 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 Proteus infection 08/22/2016  . Serratia infection 11/14/2016  . Skin cancer of face 2000's   "one on each side of my face and one on my nose" (08/13/2013)  . Swelling of extremity    sees Dr. Gwenlyn Found, cardiologist 1 x year [phone]  . Tendon dysfunction 08/22/2016  . Wears  hearing aid    [facility]   Past Surgical History:  Procedure Laterality Date  . 2D ECHOCARDIOGRAM  12/07/2004   EF 48%, moderate pulmonary hypertension  . BREAST BIOPSY Left ?1970's  . CARDIOVASCULAR STRESS TEST  12/07/11  . CARDIOVASCULAR STRESS TEST  12/07/2011   LEXISCAN, normal scan, no significant wall abnormalities noted  . CARPAL TUNNEL RELEASE Right ~ 2005  . CATARACT EXTRACTION, BILATERAL Bilateral 2000  . DILATION AND CURETTAGE OF UTERUS  1950's  . DOPPLER ECHOCARDIOGRAPHY  12/07/11  . EYE SURGERY    . FEMUR IM NAIL Right 08/01/2015   Procedure: INTRAMEDULLARY (IM) NAIL FEMORAL;  Surgeon: Marchia Bond, MD;  Location: [place];  Service: Orthopedics;  Laterality: Right;  . HARDWARE REMOVAL Right 03/16/2016   Procedure: HARDWARE REMOVAL;  Surgeon: Marchia Bond, MD;  Location: [place];  Service: Orthopedics;  Laterality: Right;  . INTRAMEDULLARY (IM) NAIL INTERTROCHANTERIC Left 01/26/2017   Procedure: INTRAMEDULLARY (IM) NAIL INTERTROCHANTRIC;  Surgeon: Leandrew Koyanagi, MD;  Location: [place];  Service: Orthopedics;  Laterality: Left;  . JOINT REPLACEMENT     bilateral knee replacement  . SKIN CANCER EXCISION  2000's   "off my nose" (08/13/2013)  . TOTAL HIP ARTHROPLASTY Right 03/16/2016   Procedure: TOTAL HIP ARTHROPLASTY;  Surgeon: Marchia Bond, MD;  Location: [place];  Service: Orthopedics;  Laterality: Right;  . TOTAL HIP ARTHROPLASTY Left 06/09/2017   Procedure: REMOVE LEFT HIP INTRAMEDULLARY NAIL AND CONVERT TO  LEFT CEMENTED HEMI HIP ARTHROPLASTY;  Surgeon: Leandrew Koyanagi, MD;  Location: [place];  Service: Orthopedics;  Laterality: Left;  . TOTAL KNEE ARTHROPLASTY Right 2005  . TOTAL KNEE ARTHROPLASTY  03/02/2012   Procedure: TOTAL KNEE ARTHROPLASTY;  Surgeon: Yvette Rack., MD;  Location: [place];  Service: Orthopedics;  Laterality: Left;   Social History   Socioeconomic History  . Marital status: Widowed    Spouse name: Not on file  . Number of children: Not on file  . Years of education:  Not on file  . Highest education level: Not on file  Occupational History  . Not on file  Tobacco Use  . Smoking status: Never Smoker  . Smokeless tobacco: Never Used  Substance and Sexual Activity  . Alcohol use: No  . Drug use: No  . Sexual activity: Never  Other Topics Concern  . Not on file  Social History Narrative   Widowed. Lives at [facility]. Son died in the [place] War and she has not slept well since then.   Admitted to [place] 01/30/17   Never smoked   Alcohol none   Full Code   Social Determinants of Health   Financial Resource Strain:   . Difficulty of Paying Living Expenses: Not on file  Food Insecurity:   . Worried About Charity fundraiser in the Last Year: Not on file  . Ran Out of Food in the Last Year: Not on file  Transportation Needs:   . Lack of Transportation (Medical): Not on file  . Lack of Transportation (Non-Medical): Not on file  Physical Activity:   . Days of Exercise per Week: Not on file  . Minutes of Exercise per Session: Not on file  Stress:   . Feeling of Stress : Not on file  Social Connections:   . Frequency of Communication with Friends and Family: Not on file  . Frequency of Social Gatherings with Friends and Family: Not on file  . Attends Religious Services: Not on file  . Active Member of Clubs or Organizations: Not on file  . Attends Archivist Meetings: Not on file  . Marital Status: Not on file   Family History  Problem Relation Age of Onset  . Arrhythmia Mother   . Heart disease Mother   . Arrhythmia Sister   . Stroke Father 50  . Heart disease Brother   . Heart disease Brother   . Cancer Brother        Colon cancer  . Arrhythmia Brother   . Heart disease Brother   . Heart disease Sister   . Heart disease Sister   . Stroke Sister   . Diabetes Sister   . Heart disease Child 50  . Heart disease Child 32   - negative except otherwise stated in the family history section Allergies    Allergen Reactions  . Levaquin [Levofloxacin] Other (See Comments)    tendinopathy  . Morphine And Related Other (See Comments)    Made pt "wild"   Prior to Admission medications   Medication Sig Start Date End Date Taking? Authorizing Provider  acetaminophen (TYLENOL) 325 MG tablet Take 650 mg by mouth every 6 (six) hours as needed for moderate pain.    Yes [provider]  chlorthalidone (HYGROTON) 25 MG tablet TAKE 1 TABLET ONCE DAILY. Patient taking differently: Take 25 mg by mouth daily.  08/06/20  Yes Lorretta Harp, MD  Cholecalciferol 2000 units CAPS  Take 2,000 Units by mouth every evening.   Yes [provider]  denosumab (PROLIA) 60 MG/ML SOLN injection Inject 60 mg into the skin every 6 (six) months. Administer in upper arm, thigh, or abdomen   Yes [provider]  diltiazem (CARDIZEM CD) 180 MG 24 hr capsule TAKE 1 CAPSULE DAILY. Patient taking differently: Take 180 mg by mouth daily.  06/03/20  Yes Lorretta Harp, MD  DULoxetine (CYMBALTA) 60 MG capsule Take 60 mg by mouth daily.    Yes [provider]  ELIQUIS 2.5 MG TABS tablet TAKE 1 TABLET BY MOUTH TWICE DAILY. Patient taking differently: Take 2.5 mg by mouth 2 (two) times daily.  07/23/20  Yes Lorretta Harp, MD  Flaxseed, Linseed, (FLAXSEED OIL) 1000 MG CAPS Take 1,000 mg by mouth 2 (two) times daily.    Yes [provider]  furosemide (LASIX) 20 MG tablet Take 1 tablet (20 mg total) by mouth as needed for edema. 02/12/19  Yes Lorretta Harp, MD  ibuprofen (ADVIL) 200 MG tablet Take 400 mg by mouth every 8 (eight) hours as needed for moderate pain.   Yes [provider]  levothyroxine (SYNTHROID) 112 MCG tablet Take 112 mcg by mouth daily before breakfast.    Yes [provider]  metoprolol (LOPRESSOR) 50 MG tablet Take 50 mg by mouth 2 (two) times daily.   Yes [provider]  Multiple Vitamins-Minerals (MACULAR HEALTH FORMULA PO) Take 1 tablet  by mouth in the morning and at bedtime.   Yes [provider]  OVER THE COUNTER MEDICATION Take 25 mg by mouth daily. CBD   Yes [provider]  potassium chloride (K-DUR) 10 MEQ tablet Take 1 tablet (10 mEq total) by mouth as needed. Take one tablet as needed when giving lasix as needed 02/12/19  Yes Lorretta Harp, MD  sulfamethoxazole-trimethoprim (BACTRIM) 400-80 MG tablet Take 1 tablet by mouth 2 (two) times daily. 10/23/19  Yes Tommy Medal, Lavell Islam, MD  temazepam (RESTORIL) 30 MG capsule Take 1 capsule (30 mg total) by mouth at bedtime as needed for sleep. 01/30/17  Yes Lavina Hamman, MD   CT Hip Left Wo Contrast  Result Date: 10/01/2020 CLINICAL DATA:  Left hip pain.  Possible stress fracture. EXAM: CT OF THE LEFT HIP WITHOUT CONTRAST TECHNIQUE: Multidetector CT imaging of the left hip was performed according to the standard protocol. Multiplanar CT image reconstructions were also generated. COMPARISON:  Radiograph of same day.  CT of June 06, 2017. FINDINGS: Status post left total hip arthroplasty for treatment of old proximal left femoral trochanteric fracture. This exam is significantly limited due to scatter artifact arising from the hip prosthesis. There appears to be a new longitudinal nondisplaced fracture in the medial periprosthetic region of the proximal left femur which most likely is acute or subacute. IMPRESSION: Status post left total hip arthroplasty for treatment of old proximal left femoral trochanteric fracture. There appears to be a new longitudinal nondisplaced fracture in the medial periprosthetic region of the proximal left femur which most likely is acute or subacute. Electronically Signed   By: Marijo Conception M.D.   On: 10/01/2020 19:17   DG Hip Unilat W or Wo Pelvis 2-3 Views Left  Result Date: 10/01/2020 CLINICAL DATA:  Pain EXAM: DG HIP (WITH OR WITHOUT PELVIS) 2-3V LEFT COMPARISON:  July 21, 2017 FINDINGS: Frontal pelvis as well as frontal and  lateral left hip images were obtained. Patient is status post total hip  replacements bilaterally with prosthetic components bilaterally well seated. No acute fracture or dislocation evident. Chronic avulsion of the greater and lesser trochanters on the left with remodeling. Bony overgrowth along the greater and lesser trochanters on the right, stable. No erosive change. Bones osteoporotic. Multiple foci of arterial atherosclerotic calcification noted. IMPRESSION: Total hip replacements with prosthetic components bilaterally well-seated. Bony remodeling in both proximal femur regions, stable. Underlying osteoporosis. No acute fracture or dislocation. Multiple foci of arterial vascular calcification noted. Electronically Signed   By: Lowella Grip III M.D.   On: 10/01/2020 16:15   - pertinent xrays, CT, MRI studies were reviewed and independently interpreted  Positive ROS: All other systems have been reviewed and were otherwise negative with the exception of those mentioned in the HPI and as above.  Physical Exam: General: No acute distress Cardiovascular: No pedal edema Respiratory: No cyanosis, no use of accessory musculature GI: No organomegaly, abdomen is soft and non-tender Skin: No lesions in the area of chief complaint Neurologic: Sensation intact distally Psychiatric: Patient is at baseline mood and affect Lymphatic: No axillary or cervical lymphadenopathy  MUSCULOSKELETAL:  - moderate pain with external rotation logroll - pain with hip flexion and IR/ER - she doesn't have much pain with hip flexion and can raise her leg off bed and abduct without pain  Assessment: Left periprosthetic femoral shaft fracture  Plan: - fracture is stable and should not require surgical stabilization - will require NWB LLE with walker for at least 6 weeks - needs PT/OT eval as she will need SNF - should follow up with me in office in about 2-3 weeks  Thank you for the consult and the opportunity  to see Leah Holland. Eduard Roux, MD [facility] 1:58 PM

## 2020-10-02 NOTE — Progress Notes (Signed)
CSW attempted to reach Jordan at [facility] without success - a voicemail was left requesting a return call.  Madilyn Fireman, MSW, LCSW-A Transitions of Care  Clinical Social Worker  [facility] Emergency Departments  Medical ICU [phone]

## 2020-10-02 NOTE — Progress Notes (Signed)
10/02/20 1450  PT Visit Information  Last PT Received On 10/02/20  Assistance Needed +1  PT/OT/SLP Co-Evaluation/Treatment Yes  Reason for Co-Treatment To address functional/ADL transfers  PT goals addressed during session Mobility/safety with mobility;Balance;Proper use of DME  History of Present Illness 86 y.o. female with medical history significant for hypertension, hyperlipidemia, as well as family history of heart disease, remote right total knee replacement, recent left knee replacement by Dr. 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 CT of the hip read as status post left total hip arthroplasty for treatment of old proximal left femur trochanteric fracture.  There appears to be a new longitudinal nondisplaced fracture in the medial periprosthetic region of the proximal left femur likely acute or subacute.   Precautions  Precautions Fall  Restrictions  Weight Bearing Restrictions Yes  LLE Weight Bearing NWB  Home Living  Family/patient expects to be discharged to: Skilled nursing facility  Prior Function  Level of Independence Independent with assistive device(s)  Comments used RW  Communication  Communication HOH (reads lips as he does not have her hearding aids with her)  Pain Assessment  Pain Assessment Faces  Faces Pain Scale 2  Pain Location  L hip  Pain Descriptors / Indicators Discomfort;Guarding  Pain Intervention(s) Limited activity within patient's tolerance;Monitored during session;Repositioned  Cognition  Arousal/Alertness Awake/alert  Behavior During Therapy WFL for tasks assessed/performed  Overall Cognitive Status Within Functional Limits for tasks assessed  Upper Extremity Assessment  Upper Extremity Assessment Defer to OT evaluation  Lower Extremity Assessment  Lower Extremity Assessment  LLE deficits/detail  LLE Deficits / Details New LLE fx; was able to perform heel slide and hip abduction  Cervical / Trunk Assessment  Cervical / Trunk Assessment Normal  Bed Mobility  Overal bed mobility Needs Assistance  Bed Mobility Supine to Sit;Sit to Supine  Supine to sit Supervision  Sit to supine Supervision  General bed mobility comments Supervision for safety.   Transfers  Overall transfer level Needs assistance  Equipment used Rolling walker (2 wheeled)  Transfers Sit to/from Stand  Sit to Stand Min assist;+2 safety/equipment  General transfer comment Min A for steadying assist to stand. Cues for safe hand placement.   Ambulation/Gait  Ambulation/Gait assistance Min assist;+2 safety/equipment  Gait Distance (Feet) 5 Feet (X2)  Assistive device Rolling walker (2 wheeled)  Gait Pattern/deviations Step-to pattern  General Gait Details Hop to pattern for short distance gait within the room. Required seated rest X1. Min A for steadying assist.   Gait velocity Decreased  Balance  Overall balance assessment Needs assistance;History of Falls  Sitting-balance support No upper extremity supported  Sitting balance-Leahy Scale Good  Standing balance support Bilateral upper extremity supported  Standing balance-Leahy Scale Poor  Standing balance comment Reliant on BUE Support  General Comments  General comments (skin integrity, edema, etc.) [name] present   Exercises  Exercises Other exercises  Other Exercises  Other Exercises encouraged LLE heel slides at least 3X/day  PT - End of Session  Equipment Utilized During Treatment Gait belt  Activity Tolerance Patient tolerated treatment well  Patient left in bed;with call bell/phone within reach;with family/visitor present (on bed in ED )  Nurse Communication Mobility status  PT Assessment  PT Recommendation/Assessment Patient needs continued PT services  PT Visit Diagnosis Unsteadiness on feet (R26.81);Muscle weakness  (generalized) (M62.81);Difficulty in walking, not elsewhere classified (  R26.2)  PT Problem List Decreased strength;Decreased range of motion;Decreased balance;Decreased activity tolerance;Decreased mobility;Decreased knowledge of use of DME;Decreased knowledge of precautions;Pain  PT Plan  PT Frequency (ACUTE ONLY) Min 3X/week  PT Treatment/Interventions (ACUTE ONLY) DME instruction;Gait training;Stair training;Therapeutic exercise;Balance training;Therapeutic activities;Functional mobility training;Patient/family education  AM-PAC PT "6 Clicks" Mobility Outcome Measure (Version 2)  Help needed turning from your back to your side while in a flat bed without using bedrails? 4  Help needed moving from lying on your back to sitting on the side of a flat bed without using bedrails? 4  Help needed moving to and from a bed to a chair (including a wheelchair)? 3  Help needed standing up from a chair using your arms (e.g., wheelchair or bedside chair)? 3  Help needed to walk in hospital room? 3  Help needed climbing 3-5 steps with a railing?  1  6 Click Score 18  Consider Recommendation of Discharge To: Home with [facility]  PT Recommendation  Follow Up Recommendations SNF  PT equipment Wheelchair (measurements PT);Wheelchair cushion (measurements PT)  Individuals Consulted  Consulted and Agree with Results and Recommendations Patient;Family member/caregiver  Family Member Consulted son  Acute Rehab PT Goals  Patient Stated Goal To return to being independent  PT Goal Formulation With patient/family  Time For Goal Achievement 10/16/20  Potential to Achieve Goals Good  PT Time Calculation  PT Start Time (ACUTE ONLY) 1154  PT Stop Time (ACUTE ONLY) 1227  PT Time Calculation (min) (ACUTE ONLY) 33 min  PT General Charges  $$ ACUTE PT VISIT 1 Visit  PT Evaluation  $PT Eval Moderate Complexity 1 Mod  Written Expression  Dominant Hand Right   Pt admitted secondary to problem above with deficits above.  Pt requiring min A for short distance ambulation within room, but overall tolerated fairly well. Pt from ILF and was previously very independent. Given her NWB status LLE and her balance deficits, she is at high risk for falls. Feel pt would benefit from short term rehab at SNF level at [facility] to maximize safety and independence with mobility and ADL. Pt and son agreeable. Will continue to follow acutely to maximize functional mobility independence and safety.   Reuel Derby, PT, DPT  Acute Rehabilitation Services  Pager: [phone] Office: [phone]

## 2020-10-02 NOTE — Progress Notes (Signed)
PROGRESS NOTE    Leah Holland  RUE:[id number] DOB: [birth year] DOA: 10/01/2020 PCP: [name], MD    Brief Narrative:  86 year old female with history of hypertension, hyperlipidemia, known multiple orthopedic issues including right total knee replacement, left total knee replacement, A. fib on Eliquis, bilateral hip prosthesis, chronic hip prosthesis infection on chronic antibiotic therapy brought to the ER with concern about pain on walking with left leg noticed 1 or 2 days.  The hip x-ray was negative.  A CT scan showed nondisplaced fracture of the medial periprosthetic region along the left femur.  Admitted with orthopedic consultation.   Assessment & Plan:   Principal Problem:   Leg fracture, left, closed, initial encounter Active Problems:   Hypertension   Hyperlipidemia   Hypothyroidism   Insomnia   Paroxysmal atrial fibrillation (HCC)   Chronic anticoagulation   Clinical depression   Chronic antibiotic suppression  Left periprosthetic femoral shaft fracture: Case seen and discussed with orthopedic surgery, Dr. Erlinda Hong. Recommended nonoperative treatment. Nonweightbearing left lower extremity with walker for 6 weeks, Start working with PT OT Patient from independent living facility, may need placement to SNF for rehab. Already on Eliquis that will work for DVT prophylaxis. Muscle relaxants and pain relief.  Paroxysmal A. fib: Currently sinus rhythm.  Therapeutic on Eliquis.  Resume Eliquis as she is not going to surgery.  Patient on Cardizem and metoprolol.  Hypertension: Blood pressure stable on chlorthalidone, Cardizem and metoprolol.  Chronic hip prosthesis infection: On chronic Bactrim therapy that she will continue.  Hypothyroidism: On Synthroid.   DVT prophylaxis: apixaban (ELIQUIS) tablet 2.5 mg Start: 10/02/20 1445 Place TED hose Start: 10/01/20 2223 apixaban (ELIQUIS) tablet 2.5 mg   Code Status: Full code Family Communication: Patient's son, Dr.  Mateo Flow at bedside. Disposition Plan: Status is: Observation  The patient will require care spanning > 2 midnights and should be moved to inpatient because: Unsafe d/c plan  Dispo: The patient is from: ALF              Anticipated d/c is to: SNF              Anticipated d/c date is: 2 days              Patient currently is medically stable to d/c.         Consultants:   Orthopedic  Procedures:   None  Antimicrobials:   Bactrim, chronic therapy   Subjective: Patient seen and examined.  Her son who is a pediatric physician is at the bedside. Remains in the ER.  Minimal pain when not mobile.  It hurts on mobility and bearing weight. When I examined her she was very hungry and was starving to have some coffee.   Objective: Vitals:   10/02/20 1130 10/02/20 1145 10/02/20 1200 10/02/20 1428  BP: (!) 169/88 (!) 140/107 (!) 165/93 (!) 145/76  Pulse: 66 70 75 74  Resp: 19 18 (!) 22 16  Temp:      TempSrc:      SpO2: 96% 96% 95% 96%  Weight:      Height:        Intake/Output Summary (Last 24 hours) at 10/02/2020 1432 Last data filed at 10/02/2020 1157 Gross per 24 hour  Intake --  Output 801 ml  Net -801 ml   Filed Weights   10/01/20 1534 10/01/20 1548  Weight: 59 kg 59 kg    Examination:  General exam: Appears calm, comfortable.  Not in any distress.  Respiratory system: Clear to auscultation. Respiratory effort normal. Cardiovascular system: S1 & S2 heard, RRR.  Gastrointestinal system: Abdomen is nondistended, soft and nontender. No organomegaly or masses felt. Normal bowel sounds heard. Central nervous system: Alert and oriented. No focal neurological deficits. Extremities: Symmetric 5 x 5 power. Patient has some pain on movement of the left leg/painful on range of motion.  No deformity of the lower extremity.     Data Reviewed: I have personally reviewed following labs and imaging studies  CBC: Recent Labs  Lab 10/01/20 2104 10/01/20 2117  10/02/20 0649  WBC 10.4  --  9.7  NEUTROABS 7.3  --   --   HGB 15.6* 16.7* 14.0  HCT 46.6* 49.0* 42.4  MCV 93.2  --  93.2  PLT 251  --  161   Basic Metabolic Panel: Recent Labs  Lab 10/01/20 2117 10/02/20 0649  NA 135 133*  K 3.4* 3.7  CL 95* 97*  CO2  --  26  GLUCOSE 128* 126*  BUN 15 10  CREATININE 0.90 0.80  CALCIUM  --  9.6   GFR: Estimated Creatinine Clearance: 34 mL/min (by C-G formula based on SCr of 0.8 mg/dL). Liver Function Tests: No results for input(s): AST, ALT, ALKPHOS, BILITOT, PROT, ALBUMIN in the last 168 hours. No results for input(s): LIPASE, AMYLASE in the last 168 hours. No results for input(s): AMMONIA in the last 168 hours. Coagulation Profile: No results for input(s): INR, PROTIME in the last 168 hours. Cardiac Enzymes: No results for input(s): CKTOTAL, CKMB, CKMBINDEX, TROPONINI in the last 168 hours. BNP (last 3 results) No results for input(s): PROBNP in the last 8760 hours. HbA1C: No results for input(s): HGBA1C in the last 72 hours. CBG: No results for input(s): GLUCAP in the last 168 hours. Lipid Profile: No results for input(s): CHOL, HDL, LDLCALC, TRIG, CHOLHDL, LDLDIRECT in the last 72 hours. Thyroid Function Tests: Recent Labs    10/02/20 0649  TSH 1.197   Anemia Panel: No results for input(s): VITAMINB12, FOLATE, FERRITIN, TIBC, IRON, RETICCTPCT in the last 72 hours. Sepsis Labs: No results for input(s): PROCALCITON, LATICACIDVEN in the last 168 hours.  Recent Results (from the past 240 hour(s))  Respiratory Panel by RT PCR (Flu A&B, Covid) - Nasopharyngeal Swab     Status: None   Collection Time: 10/01/20  9:05 PM   Specimen: Nasopharyngeal Swab  Result Value Ref Range Status   SARS Coronavirus 2 by RT PCR NEGATIVE NEGATIVE Final    Comment: (NOTE) SARS-CoV-2 target nucleic acids are NOT DETECTED.  The SARS-CoV-2 RNA is generally detectable in upper respiratoy specimens during the acute phase of infection. The  lowest concentration of SARS-CoV-2 viral copies this assay can detect is 131 copies/mL. A negative result does not preclude SARS-Cov-2 infection and should not be used as the sole basis for treatment or other patient management decisions. A negative result may occur with  improper specimen collection/handling, submission of specimen other than nasopharyngeal swab, presence of viral mutation(s) within the areas targeted by this assay, and inadequate number of viral copies (<131 copies/mL). A negative result must be combined with clinical observations, patient history, and epidemiological information. The expected result is Negative.  Fact Sheet for Patients:  PinkCheek.be  Fact Sheet for Healthcare Providers:  GravelBags.it  This test is no t yet approved or cleared by the [place] FDA and  has been authorized for detection and/or diagnosis of SARS-CoV-2 by FDA under an Emergency Use Authorization (EUA). This EUA will  remain  in effect (meaning this test can be used) for the duration of the COVID-19 declaration under Section 564(b)(1) of the Act, 21 U.S.C. section 360bbb-3(b)(1), unless the authorization is terminated or revoked sooner.     Influenza A by PCR NEGATIVE NEGATIVE Final   Influenza B by PCR NEGATIVE NEGATIVE Final    Comment: (NOTE) The Xpert Xpress SARS-CoV-2/FLU/RSV assay is intended as an aid in  the diagnosis of influenza from Nasopharyngeal swab specimens and  should not be used as a sole basis for treatment. Nasal washings and  aspirates are unacceptable for Xpert Xpress SARS-CoV-2/FLU/RSV  testing.  Fact Sheet for Patients: PinkCheek.be  Fact Sheet for Healthcare Providers: GravelBags.it  This test is not yet approved or cleared by the [place] FDA and  has been authorized for detection and/or diagnosis of SARS-CoV-2 by  FDA under  an Emergency Use Authorization (EUA). This EUA will remain  in effect (meaning this test can be used) for the duration of the  Covid-19 declaration under Section 564(b)(1) of the Act, 21  U.S.C. section 360bbb-3(b)(1), unless the authorization is  terminated or revoked. Performed at [facility], [place] [address] [place], [place] [address]          Radiology Studies: CT Hip Left Wo Contrast  Result Date: 10/01/2020 CLINICAL DATA:  Left hip pain.  Possible stress fracture. EXAM: CT OF THE LEFT HIP WITHOUT CONTRAST TECHNIQUE: Multidetector CT imaging of the left hip was performed according to the standard protocol. Multiplanar CT image reconstructions were also generated. COMPARISON:  Radiograph of same day.  CT of June 06, 2017. FINDINGS: Status post left total hip arthroplasty for treatment of old proximal left femoral trochanteric fracture. This exam is significantly limited due to scatter artifact arising from the hip prosthesis. There appears to be a new longitudinal nondisplaced fracture in the medial periprosthetic region of the proximal left femur which most likely is acute or subacute. IMPRESSION: Status post left total hip arthroplasty for treatment of old proximal left femoral trochanteric fracture. There appears to be a new longitudinal nondisplaced fracture in the medial periprosthetic region of the proximal left femur which most likely is acute or subacute. Electronically Signed   By: Marijo Conception M.D.   On: 10/01/2020 19:17   DG Hip Unilat W or Wo Pelvis 2-3 Views Left  Result Date: 10/01/2020 CLINICAL DATA:  Pain EXAM: DG HIP (WITH OR WITHOUT PELVIS) 2-3V LEFT COMPARISON:  July 21, 2017 FINDINGS: Frontal pelvis as well as frontal and lateral left hip images were obtained. Patient is status post total hip replacements bilaterally with prosthetic components bilaterally well seated. No acute fracture or dislocation evident. Chronic avulsion of the greater and lesser  trochanters on the left with remodeling. Bony overgrowth along the greater and lesser trochanters on the right, stable. No erosive change. Bones osteoporotic. Multiple foci of arterial atherosclerotic calcification noted. IMPRESSION: Total hip replacements with prosthetic components bilaterally well-seated. Bony remodeling in both proximal femur regions, stable. Underlying osteoporosis. No acute fracture or dislocation. Multiple foci of arterial vascular calcification noted. Electronically Signed   By: Lowella Grip III M.D.   On: 10/01/2020 16:15        Scheduled Meds: . apixaban  2.5 mg Oral BID  . chlorthalidone  25 mg Oral Daily  . diltiazem  180 mg Oral Daily  . DULoxetine  60 mg Oral Daily  . levothyroxine  112 mcg Oral Q0600  . metoprolol tartrate  50 mg Oral BID  .  pantoprazole  40 mg Oral Daily  . polyethylene glycol  17 g Oral Daily  . potassium chloride  40 mEq Oral Daily  . sulfamethoxazole-trimethoprim  1 tablet Oral BID   Continuous Infusions:   LOS: 0 days    Time spent: 30 minutes    Barb Merino, MD [facility] Pager [phone]

## 2020-10-02 NOTE — ED Notes (Signed)
Patient requested to use the restroom. Nurse brought bedside commode into room and patient assisted onto commode and patient had a BM and urinated. Nurse assisted with clean up and helped patient back into bed.

## 2020-10-02 NOTE — ED Notes (Signed)
Dinner Tray Ordered @ 1657. 

## 2020-10-02 NOTE — ED Notes (Signed)
MD paged inquiring about sleep aid per pt request

## 2020-10-03 DIAGNOSIS — Z471 Aftercare following joint replacement surgery: Secondary | ICD-10-CM | POA: Diagnosis not present

## 2020-10-03 DIAGNOSIS — R41841 Cognitive communication deficit: Secondary | ICD-10-CM | POA: Diagnosis not present

## 2020-10-03 DIAGNOSIS — Z7901 Long term (current) use of anticoagulants: Secondary | ICD-10-CM | POA: Diagnosis not present

## 2020-10-03 DIAGNOSIS — S72392A Other fracture of shaft of left femur, initial encounter for closed fracture: Secondary | ICD-10-CM | POA: Diagnosis present

## 2020-10-03 DIAGNOSIS — E119 Type 2 diabetes mellitus without complications: Secondary | ICD-10-CM | POA: Diagnosis present

## 2020-10-03 DIAGNOSIS — M1990 Unspecified osteoarthritis, unspecified site: Secondary | ICD-10-CM | POA: Diagnosis not present

## 2020-10-03 DIAGNOSIS — M6281 Muscle weakness (generalized): Secondary | ICD-10-CM | POA: Diagnosis not present

## 2020-10-03 DIAGNOSIS — M810 Age-related osteoporosis without current pathological fracture: Secondary | ICD-10-CM | POA: Diagnosis present

## 2020-10-03 DIAGNOSIS — Z85828 Personal history of other malignant neoplasm of skin: Secondary | ICD-10-CM | POA: Diagnosis not present

## 2020-10-03 DIAGNOSIS — M9702XA Periprosthetic fracture around internal prosthetic left hip joint, initial encounter: Secondary | ICD-10-CM

## 2020-10-03 DIAGNOSIS — I10 Essential (primary) hypertension: Secondary | ICD-10-CM | POA: Diagnosis present

## 2020-10-03 DIAGNOSIS — T847XXS Infection and inflammatory reaction due to other internal orthopedic prosthetic devices, implants and grafts, sequela: Secondary | ICD-10-CM | POA: Diagnosis not present

## 2020-10-03 DIAGNOSIS — M2550 Pain in unspecified joint: Secondary | ICD-10-CM | POA: Diagnosis not present

## 2020-10-03 DIAGNOSIS — Z96641 Presence of right artificial hip joint: Secondary | ICD-10-CM | POA: Diagnosis present

## 2020-10-03 DIAGNOSIS — I272 Pulmonary hypertension, unspecified: Secondary | ICD-10-CM | POA: Diagnosis present

## 2020-10-03 DIAGNOSIS — E039 Hypothyroidism, unspecified: Secondary | ICD-10-CM | POA: Diagnosis present

## 2020-10-03 DIAGNOSIS — R1313 Dysphagia, pharyngeal phase: Secondary | ICD-10-CM | POA: Diagnosis not present

## 2020-10-03 DIAGNOSIS — X58XXXA Exposure to other specified factors, initial encounter: Secondary | ICD-10-CM | POA: Diagnosis present

## 2020-10-03 DIAGNOSIS — G4700 Insomnia, unspecified: Secondary | ICD-10-CM | POA: Diagnosis present

## 2020-10-03 DIAGNOSIS — K5909 Other constipation: Secondary | ICD-10-CM | POA: Diagnosis not present

## 2020-10-03 DIAGNOSIS — Z79899 Other long term (current) drug therapy: Secondary | ICD-10-CM | POA: Diagnosis not present

## 2020-10-03 DIAGNOSIS — I4891 Unspecified atrial fibrillation: Secondary | ICD-10-CM | POA: Diagnosis not present

## 2020-10-03 DIAGNOSIS — D72829 Elevated white blood cell count, unspecified: Secondary | ICD-10-CM | POA: Diagnosis not present

## 2020-10-03 DIAGNOSIS — W19XXXA Unspecified fall, initial encounter: Secondary | ICD-10-CM | POA: Diagnosis not present

## 2020-10-03 DIAGNOSIS — S72402D Unspecified fracture of lower end of left femur, subsequent encounter for closed fracture with routine healing: Secondary | ICD-10-CM | POA: Diagnosis not present

## 2020-10-03 DIAGNOSIS — M25552 Pain in left hip: Secondary | ICD-10-CM | POA: Diagnosis not present

## 2020-10-03 DIAGNOSIS — E222 Syndrome of inappropriate secretion of antidiuretic hormone: Secondary | ICD-10-CM | POA: Diagnosis present

## 2020-10-03 DIAGNOSIS — F419 Anxiety disorder, unspecified: Secondary | ICD-10-CM | POA: Diagnosis not present

## 2020-10-03 DIAGNOSIS — R278 Other lack of coordination: Secondary | ICD-10-CM | POA: Diagnosis not present

## 2020-10-03 DIAGNOSIS — Z7401 Bed confinement status: Secondary | ICD-10-CM | POA: Diagnosis not present

## 2020-10-03 DIAGNOSIS — F411 Generalized anxiety disorder: Secondary | ICD-10-CM | POA: Diagnosis present

## 2020-10-03 DIAGNOSIS — S8292XA Unspecified fracture of left lower leg, initial encounter for closed fracture: Secondary | ICD-10-CM | POA: Diagnosis not present

## 2020-10-03 DIAGNOSIS — F5101 Primary insomnia: Secondary | ICD-10-CM | POA: Diagnosis not present

## 2020-10-03 DIAGNOSIS — E1300 Other specified diabetes mellitus with hyperosmolarity without nonketotic hyperglycemic-hyperosmolar coma (NKHHC): Secondary | ICD-10-CM | POA: Diagnosis not present

## 2020-10-03 DIAGNOSIS — S72092S Other fracture of head and neck of left femur, sequela: Secondary | ICD-10-CM | POA: Diagnosis not present

## 2020-10-03 DIAGNOSIS — M25551 Pain in right hip: Secondary | ICD-10-CM | POA: Diagnosis not present

## 2020-10-03 DIAGNOSIS — Z4789 Encounter for other orthopedic aftercare: Secondary | ICD-10-CM | POA: Diagnosis not present

## 2020-10-03 DIAGNOSIS — S72145K Nondisplaced intertrochanteric fracture of left femur, subsequent encounter for closed fracture with nonunion: Secondary | ICD-10-CM | POA: Diagnosis not present

## 2020-10-03 DIAGNOSIS — R1312 Dysphagia, oropharyngeal phase: Secondary | ICD-10-CM | POA: Diagnosis not present

## 2020-10-03 DIAGNOSIS — R296 Repeated falls: Secondary | ICD-10-CM | POA: Diagnosis not present

## 2020-10-03 DIAGNOSIS — M21751 Unequal limb length (acquired), right femur: Secondary | ICD-10-CM | POA: Diagnosis not present

## 2020-10-03 DIAGNOSIS — R29898 Other symptoms and signs involving the musculoskeletal system: Secondary | ICD-10-CM | POA: Diagnosis not present

## 2020-10-03 DIAGNOSIS — M9702XD Periprosthetic fracture around internal prosthetic left hip joint, subsequent encounter: Secondary | ICD-10-CM | POA: Diagnosis not present

## 2020-10-03 DIAGNOSIS — K219 Gastro-esophageal reflux disease without esophagitis: Secondary | ICD-10-CM | POA: Diagnosis present

## 2020-10-03 DIAGNOSIS — I480 Paroxysmal atrial fibrillation: Secondary | ICD-10-CM | POA: Diagnosis present

## 2020-10-03 DIAGNOSIS — E785 Hyperlipidemia, unspecified: Secondary | ICD-10-CM | POA: Diagnosis present

## 2020-10-03 DIAGNOSIS — F32A Depression, unspecified: Secondary | ICD-10-CM | POA: Diagnosis present

## 2020-10-03 DIAGNOSIS — Y92009 Unspecified place in unspecified non-institutional (private) residence as the place of occurrence of the external cause: Secondary | ICD-10-CM | POA: Diagnosis not present

## 2020-10-03 DIAGNOSIS — I499 Cardiac arrhythmia, unspecified: Secondary | ICD-10-CM | POA: Diagnosis not present

## 2020-10-03 DIAGNOSIS — R2681 Unsteadiness on feet: Secondary | ICD-10-CM | POA: Diagnosis not present

## 2020-10-03 DIAGNOSIS — Z66 Do not resuscitate: Secondary | ICD-10-CM | POA: Diagnosis present

## 2020-10-03 DIAGNOSIS — Z96653 Presence of artificial knee joint, bilateral: Secondary | ICD-10-CM | POA: Diagnosis present

## 2020-10-03 DIAGNOSIS — K440 Diaphragmatic hernia with obstruction, without gangrene: Secondary | ICD-10-CM | POA: Diagnosis not present

## 2020-10-03 DIAGNOSIS — F339 Major depressive disorder, recurrent, unspecified: Secondary | ICD-10-CM | POA: Diagnosis not present

## 2020-10-03 DIAGNOSIS — Z79890 Hormone replacement therapy (postmenopausal): Secondary | ICD-10-CM | POA: Diagnosis not present

## 2020-10-03 DIAGNOSIS — Z20822 Contact with and (suspected) exposure to covid-19: Secondary | ICD-10-CM | POA: Diagnosis present

## 2020-10-03 DIAGNOSIS — M5432 Sciatica, left side: Secondary | ICD-10-CM | POA: Diagnosis present

## 2020-10-03 DIAGNOSIS — R531 Weakness: Secondary | ICD-10-CM | POA: Diagnosis not present

## 2020-10-03 MED ORDER — ENSURE ENLIVE PO LIQD
237.0000 mL | Freq: Two times a day (BID) | ORAL | Status: DC
  Administered 2020-10-05: 237 mL via ORAL

## 2020-10-03 NOTE — NC FL2 (Signed)
[place] LEVEL OF CARE SCREENING TOOL     IDENTIFICATION  Patient Name: Leah Holland Birthdate: Jun 05, 1922 Sex: female Admission Date (Current Location): 10/01/2020  [facility] and [place] Number:  Herbalist and Address:  [facility] [place]. [facility], [place] [address], [place], [place] [address]      Provider Number: [id number]  Attending Physician Name and Address:  Elie Confer, MD  Relative Name and Phone Number:  [name], son, [phone]    Current Level of Care: Hospital Recommended Level of Care: [place] Prior Approval Number:    Date Approved/Denied:   PASRR Number: [id number]  Discharge Plan: SNF    Current Diagnoses: Patient Active Problem List   Diagnosis Date Noted  . Leg fracture, left, closed, initial encounter 10/01/2020  . Hypertensive urgency 10/22/2018  . Chronic low back pain 12/21/2017  . Chronic antibiotic suppression 06/13/2017  . History of hip replacement 06/09/2017  . Ecchymosis 02/23/2017  . Closed intertrochanteric fracture with nonunion, left 01/26/2017  . Leukocytosis 01/26/2017  . Fall 01/26/2017  . Prosthetic hip infection ([place]) 08/22/2016  . Tendinopathy 08/22/2016  . Clinical depression 04/07/2016  . Arthritis of knee, degenerative 04/07/2016  . OP (osteoporosis) 04/07/2016  . 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 Chronic anticoagulation 08/06/2015  . Bipolar disorder ([place]) 08/04/2015  . Hyponatremia 08/04/2015  . Constipation 08/04/2015  . Complication of anesthesia 07/30/2015  . Diabetes mellitus type 2, diet-controlled ([place])   . Skin cancer of face   . Paroxysmal atrial fibrillation ([place]) 09/01/2014  . Right carotid bruit 11/18/2013  . Postoperative anemia due to acute blood loss 03/03/2012  . Hypertension   . Hyperlipidemia   . Swelling of extremity   . Cataract   . Hypothyroidism   . Arthritis   . Insomnia     Orientation RESPIRATION BLADDER Height &  Weight     Self, Time, Situation, Place  Normal Continent Weight: 130 lb (59 kg) Height:  5\' 3"  (160 cm)  BEHAVIORAL SYMPTOMS/MOOD NEUROLOGICAL BOWEL NUTRITION STATUS      Continent Diet (Please see DC Summary)  AMBULATORY STATUS COMMUNICATION OF NEEDS Skin   Limited Assist Verbally Normal                       Personal Care Assistance Level of Assistance  Bathing, Feeding, Dressing Bathing Assistance: Limited assistance Feeding assistance: Independent Dressing Assistance: Limited assistance     Functional Limitations Info  Sight, Hearing, Speech Sight Info: Adequate Hearing Info: Adequate Speech Info: Adequate    SPECIAL CARE FACTORS FREQUENCY  PT (By licensed PT), OT (By licensed OT)     PT Frequency: 5x/week OT Frequency: 5x/week            Contractures Contractures Info: Not present    Additional Factors Info  Code Status, Allergies, Psychotropic Code Status Info: Full Allergies Info: Levaquin (Levofloxacin), Morphine And Related Psychotropic Info: Cymbalta         Current Medications (10/03/2020):  This is the current hospital active medication list Current Facility-Administered Medications  Medication Dose Route Frequency Provider Last Rate Last Admin  . acetaminophen (TYLENOL) tablet 650 mg  650 mg Oral Q6H PRN Cox, Amy N, DO   650 mg at 10/03/20 1041  . apixaban (ELIQUIS) tablet 2.5 mg  2.5 mg Oral BID Barb Merino, MD   2.5 mg at 10/03/20 0857  . chlorthalidone (HYGROTON) tablet 25 mg  25 mg Oral Daily Cox, Amy  N, DO   25 mg at 10/03/20 0857  . diltiazem (CARDIZEM CD) 24 hr capsule 180 mg  180 mg Oral Daily Cox, Amy N, DO   180 mg at 10/03/20 0857  . DULoxetine (CYMBALTA) DR capsule 60 mg  60 mg Oral Daily Cox, Amy N, DO   60 mg at 10/03/20 0857  . levothyroxine (SYNTHROID) tablet 112 mcg  112 mcg Oral Q0600 Cox, Amy N, DO   112 mcg at 10/03/20 0558  . metoprolol tartrate (LOPRESSOR) tablet 50 mg  50 mg Oral BID Cox, Amy N, DO   50 mg at 10/03/20  0857  . pantoprazole (PROTONIX) EC tablet 40 mg  40 mg Oral Daily Cox, Amy N, DO   40 mg at 10/03/20 0857  . polyethylene glycol (MIRALAX / GLYCOLAX) packet 17 g  17 g Oral Daily Cox, Amy N, DO   17 g at 10/03/20 0857  . sulfamethoxazole-trimethoprim (BACTRIM) 400-80 MG per tablet 1 tablet  1 tablet Oral BID Cox, Amy N, DO   1 tablet at 10/03/20 0857  . temazepam (RESTORIL) capsule 30 mg  30 mg Oral QHS PRN Cox, Amy N, DO   30 mg at 10/02/20 2212     Discharge Medications: Please see discharge summary for a list of discharge medications.  Relevant Imaging Results:  Relevant Lab Results:   Additional Information ss#[id number]  Benard Halsted, LCSW

## 2020-10-03 NOTE — Plan of Care (Signed)

## 2020-10-03 NOTE — Progress Notes (Signed)
Initial Nutrition Assessment  INTERVENTION:   -Ensure Enlive po BID, each supplement provides 350 kcal and 20 grams of protein  NUTRITION DIAGNOSIS:   Increased nutrient needs related to  (left femur fracture) as evidenced by per patient/family report.  GOAL:   Patient will meet greater than or equal to 90% of their needs  MONITOR:   PO intake, Supplement acceptance, Labs, Weight trends, I & O's  REASON FOR ASSESSMENT:   Consult Hip fracture protocol  ASSESSMENT:   86 year old female with history of hypertension, hyperlipidemia, known multiple orthopedic issues including right total knee replacement, left total knee replacement, A. fib on Eliquis, bilateral hip prosthesis, chronic hip prosthesis infection on chronic antibiotic therapy brought to the ER with concern about pain on walking with left leg noticed 1 or 2 days.  The hip x-ray was negative.  A CT scan showed nondisplaced fracture of the medial periprosthetic region along the left femur.  Patient with fracture of left femur, orthopedics currently recommending non-operative care at this time.  Currently consuming 25-100% of meals today. Would benefit from nutritional supplements to aid in healing of fracture. Will order Ensure.  Per weight records, pt has lost 6 lbs since 3/3 (4% wt loss x 7.5 months, insignificant for time frame).  Medications: Miralax, KLOR-CON  Labs reviewed: Low Na  NUTRITION - FOCUSED PHYSICAL EXAM:  Unable to complete  Diet Order:   Diet Order            Diet regular Room service appropriate? Yes; Fluid consistency: Thin  Diet effective now           Diet - low sodium heart healthy                 EDUCATION NEEDS:   No education needs have been identified at this time  Skin:  Skin Assessment: Reviewed RN Assessment  Last BM:  10/22 -type 4  Height:   Ht Readings from Last 1 Encounters:  10/01/20 5\' 3"  (1.6 m)    Weight:   Wt Readings from Last 1 Encounters:  10/01/20  59 kg    BMI:  Body mass index is 23.03 kg/m.  Estimated Nutritional Needs:   Kcal:  1400-1600  Protein:  65-75g  Fluid:  1.6L/day  Clayton Bibles, MS, RD, LDN Inpatient Clinical Dietitian Contact information available via Amion

## 2020-10-03 NOTE — Progress Notes (Addendum)
PROGRESS NOTE    Leah Holland  YPP:[id number] DOB: 02-Jul-1922 DOA: 10/01/2020 PCP: Leah Shanks, MD    Brief Narrative:  86 year old female with history of hypertension, hyperlipidemia, known multiple orthopedic issues including right total knee replacement, left total knee replacement, A. fib on Eliquis, bilateral hip prosthesis, chronic hip prosthesis infection on chronic antibiotic therapy brought to the ER with concern about pain on walking with left leg noticed 1 or 2 days.  The hip x-ray was negative.  A CT scan showed nondisplaced fracture of the medial periprosthetic region along the left femur.  Admitted with orthopedic consultation.   Assessment & Plan:   Principal Problem:   Leg fracture, left, closed, initial encounter Active Problems:   Hypertension   Hyperlipidemia   Hypothyroidism   Insomnia   Paroxysmal atrial fibrillation (HCC)   Chronic anticoagulation   Clinical depression   Chronic antibiotic suppression  Left periprosthetic femoral shaft fracture: Case seen and discussed with orthopedic surgery, Leah Holland. Recommended nonoperative treatment. Nonweightbearing left lower extremity with walker for 6 weeks, Start working with PT/OT Patient from independent living facility, may need placement to SNF for rehab. Already on Eliquis that will cover for DVT prophylaxis. Continue with pain control .  Paroxysmal A. fib: Currently sinus rhythm.  Continue with Eliquis, Cardizem and metoprolol  Hypertension: Blood pressure stable on chlorthalidone, Cardizem and metoprolol.  Chronic hip prosthesis infection: On chronic Bactrim therapy that she will continue.  Hypothyroidism: Continue with levothyroxine     DVT prophylaxis: apixaban (ELIQUIS) tablet 2.5 mg Start: 10/02/20 1445 Place TED hose Start: 10/01/20 2223 apixaban (ELIQUIS) tablet 2.5 mg   Code Status: Full code Family Communication: Patient's son, Dr. Mateo Holland at bedside. Disposition Plan: Patient  will need discharge to SNF as soon as arranged by case worker.    Dispo: The patient is from: ALF              Anticipated d/c is to: SNF              Anticipated d/c date is: 2 days              Patient currently is medically stable fo d/c.         Consultants:   Orthopedic  Procedures:   None  Antimicrobials:   Bactrim, chronic therapy   Subjective: Patient is seen and examined at bedside.  She is alert and oriented and not in acute distress.  Denies any unusual pain.  Periprosthetic fracture of left lower extremity.  Ortho recommended conservative management with nonweightbearing for 6 weeks.  Awaiting discharge to SNF. Continue with pain control.  Objective: Vitals:   10/02/20 1930 10/02/20 2132 10/03/20 0300 10/03/20 0744  BP: 138/72 (!) 156/86 (!) 165/83 140/65  Pulse: 78 80 66 74  Resp: (!) 21 18 16 16   Temp:  98.1 F (36.7 C) 98.5 F (36.9 C) 97.7 F (36.5 C)  TempSrc:  Oral Oral Oral  SpO2: 95% 98% 95% 97%  Weight:      Height:        Intake/Output Summary (Last 24 hours) at 10/03/2020 1426 Last data filed at 10/03/2020 0800 Gross per 24 hour  Intake 240 ml  Output --  Net 240 ml   Filed Weights   10/01/20 1534 10/01/20 1548  Weight: 59 kg 59 kg    Examination:  General exam: Appears calm, comfortable.  Not in any distress. Respiratory system: Clear to auscultation. Respiratory effort normal. Cardiovascular system: S1 & S2  heard, RRR.  Gastrointestinal system: Abdomen is nondistended, soft and nontender. No organomegaly or masses felt. Normal bowel sounds heard. Central nervous system: Alert and oriented. No focal neurological deficits. Extremities: Symmetric 5 x 5 power. Patient has some pain on movement of the left leg/painful on range of motion.  No deformity of the lower extremity.     Data Reviewed: I have personally reviewed following labs and imaging studies  CBC: Recent Labs  Lab 10/01/20 2104 10/01/20 2117 10/02/20 0649   WBC 10.4  --  9.7  NEUTROABS 7.3  --   --   HGB 15.6* 16.7* 14.0  HCT 46.6* 49.0* 42.4  MCV 93.2  --  93.2  PLT 251  --  160   Basic Metabolic Panel: Recent Labs  Lab 10/01/20 2117 10/02/20 0649  NA 135 133*  K 3.4* 3.7  CL 95* 97*  CO2  --  26  GLUCOSE 128* 126*  BUN 15 10  CREATININE 0.90 0.80  CALCIUM  --  9.6   GFR: Estimated Creatinine Clearance: 34 mL/min (by C-G formula based on SCr of 0.8 mg/dL). Liver Function Tests: No results for input(s): AST, ALT, ALKPHOS, BILITOT, PROT, ALBUMIN in the last 168 hours. No results for input(s): LIPASE, AMYLASE in the last 168 hours. No results for input(s): AMMONIA in the last 168 hours. Coagulation Profile: No results for input(s): INR, PROTIME in the last 168 hours. Cardiac Enzymes: No results for input(s): CKTOTAL, CKMB, CKMBINDEX, TROPONINI in the last 168 hours. BNP (last 3 results) No results for input(s): PROBNP in the last 8760 hours. HbA1C: No results for input(s): HGBA1C in the last 72 hours. CBG: No results for input(s): GLUCAP in the last 168 hours. Lipid Profile: No results for input(s): CHOL, HDL, LDLCALC, TRIG, CHOLHDL, LDLDIRECT in the last 72 hours. Thyroid Function Tests: Recent Labs    10/02/20 0649  TSH 1.197   Anemia Panel: No results for input(s): VITAMINB12, FOLATE, FERRITIN, TIBC, IRON, RETICCTPCT in the last 72 hours. Sepsis Labs: No results for input(s): PROCALCITON, LATICACIDVEN in the last 168 hours.  Recent Results (from the past 240 hour(s))  Respiratory Panel by RT PCR (Flu A&B, Covid) - Nasopharyngeal Swab     Status: None   Collection Time: 10/01/20  9:05 PM   Specimen: Nasopharyngeal Swab  Result Value Ref Range Status   SARS Coronavirus 2 by RT PCR NEGATIVE NEGATIVE Final    Comment: (NOTE) SARS-CoV-2 target nucleic acids are NOT DETECTED.  The SARS-CoV-2 RNA is generally detectable in upper respiratoy specimens during the acute phase of infection. The lowest concentration of  SARS-CoV-2 viral copies this assay can detect is 131 copies/mL. A negative result does not preclude SARS-Cov-2 infection and should not be used as the sole basis for treatment or other patient management decisions. A negative result may occur with  improper specimen collection/handling, submission of specimen other than nasopharyngeal swab, presence of viral mutation(s) within the areas targeted by this assay, and inadequate number of viral copies (<131 copies/mL). A negative result must be combined with clinical observations, patient history, and epidemiological information. The expected result is Negative.  Fact Sheet for Patients:  PinkCheek.be  Fact Sheet for Healthcare Providers:  GravelBags.it  This test is no t yet approved or cleared by the [place] FDA and  has been authorized for detection and/or diagnosis of SARS-CoV-2 by FDA under an Emergency Use Authorization (EUA). This EUA will remain  in effect (meaning this test can be used) for the duration  of the COVID-19 declaration under Section 564(b)(1) of the Act, 21 U.S.C. section 360bbb-3(b)(1), unless the authorization is terminated or revoked sooner.     Influenza A by PCR NEGATIVE NEGATIVE Final   Influenza B by PCR NEGATIVE NEGATIVE Final    Comment: (NOTE) The Xpert Xpress SARS-CoV-2/FLU/RSV assay is intended as an aid in  the diagnosis of influenza from Nasopharyngeal swab specimens and  should not be used as a sole basis for treatment. Nasal washings and  aspirates are unacceptable for Xpert Xpress SARS-CoV-2/FLU/RSV  testing.  Fact Sheet for Patients: PinkCheek.be  Fact Sheet for Healthcare Providers: GravelBags.it  This test is not yet approved or cleared by the [place] FDA and  has been authorized for detection and/or diagnosis of SARS-CoV-2 by  FDA under an Emergency Use  Authorization (EUA). This EUA will remain  in effect (meaning this test can be used) for the duration of the  Covid-19 declaration under Section 564(b)(1) of the Act, 21  U.S.C. section 360bbb-3(b)(1), unless the authorization is  terminated or revoked. Performed at [facility], [place] [address], [place], [place] [address]          Radiology Studies: CT Hip Left Wo Contrast  Result Date: 10/01/2020 CLINICAL DATA:  Left hip pain.  Possible stress fracture. EXAM: CT OF THE LEFT HIP WITHOUT CONTRAST TECHNIQUE: Multidetector CT imaging of the left hip was performed according to the standard protocol. Multiplanar CT image reconstructions were also generated. COMPARISON:  Radiograph of same day.  CT of June 06, 2017. FINDINGS: Status post left total hip arthroplasty for treatment of old proximal left femoral trochanteric fracture. This exam is significantly limited due to scatter artifact arising from the hip prosthesis. There appears to be a new longitudinal nondisplaced fracture in the medial periprosthetic region of the proximal left femur which most likely is acute or subacute. IMPRESSION: Status post left total hip arthroplasty for treatment of old proximal left femoral trochanteric fracture. There appears to be a new longitudinal nondisplaced fracture in the medial periprosthetic region of the proximal left femur which most likely is acute or subacute. Electronically Signed   By: Marijo Conception M.D.   On: 10/01/2020 19:17   DG Hip Unilat W or Wo Pelvis 2-3 Views Left  Result Date: 10/01/2020 CLINICAL DATA:  Pain EXAM: DG HIP (WITH OR WITHOUT PELVIS) 2-3V LEFT COMPARISON:  July 21, 2017 FINDINGS: Frontal pelvis as well as frontal and lateral left hip images were obtained. Patient is status post total hip replacements bilaterally with prosthetic components bilaterally well seated. No acute fracture or dislocation evident. Chronic avulsion of the greater and lesser trochanters on the left  with remodeling. Bony overgrowth along the greater and lesser trochanters on the right, stable. No erosive change. Bones osteoporotic. Multiple foci of arterial atherosclerotic calcification noted. IMPRESSION: Total hip replacements with prosthetic components bilaterally well-seated. Bony remodeling in both proximal femur regions, stable. Underlying osteoporosis. No acute fracture or dislocation. Multiple foci of arterial vascular calcification noted. Electronically Signed   By: Lowella Grip III M.D.   On: 10/01/2020 16:15        Scheduled Meds: . apixaban  2.5 mg Oral BID  . chlorthalidone  25 mg Oral Daily  . diltiazem  180 mg Oral Daily  . DULoxetine  60 mg Oral Daily  . levothyroxine  112 mcg Oral Q0600  . metoprolol tartrate  50 mg Oral BID  . pantoprazole  40 mg Oral Daily  . polyethylene glycol  17 g Oral Daily  . sulfamethoxazole-trimethoprim  1 tablet Oral BID   Continuous Infusions:   LOS: 0 days    Time spent: 30 minutes    Elie Confer, MD [facility] Pager [phone]

## 2020-10-04 DIAGNOSIS — S8292XA Unspecified fracture of left lower leg, initial encounter for closed fracture: Secondary | ICD-10-CM | POA: Diagnosis not present

## 2020-10-04 LAB — COMPREHENSIVE METABOLIC PANEL
ALT: 12 U/L (ref 0–44)
AST: 14 U/L — ABNORMAL LOW (ref 15–41)
Albumin: 3 g/dL — ABNORMAL LOW (ref 3.5–5.0)
Alkaline Phosphatase: 73 U/L (ref 38–126)
Anion gap: 7 (ref 5–15)
BUN: 11 mg/dL (ref 8–23)
CO2: 29 mmol/L (ref 22–32)
Calcium: 9 mg/dL (ref 8.9–10.3)
Chloride: 93 mmol/L — ABNORMAL LOW (ref 98–111)
Creatinine, Ser: 0.81 mg/dL (ref 0.44–1.00)
GFR, Estimated: >60 mL/min (ref >60)
Glucose, Bld: 116 mg/dL — ABNORMAL HIGH (ref 70–99)
Potassium: 3.8 mmol/L (ref 3.5–5.1)
Sodium: 129 mmol/L — ABNORMAL LOW (ref 135–145)
Total Bilirubin: 0.7 mg/dL (ref 0.3–1.2)
Total Protein: 5.8 g/dL — ABNORMAL LOW (ref 6.5–8.1)

## 2020-10-04 LAB — CBC
HCT: 39.1 % (ref 36.0–46.0)
Hemoglobin: 13.1 g/dL (ref 12.0–15.0)
MCH: 30.7 pg (ref 26.0–34.0)
MCHC: 33.5 g/dL (ref 30.0–36.0)
MCV: 91.6 fL (ref 80.0–100.0)
Platelets: 220 10*3/uL (ref 150–400)
RBC: 4.27 MIL/uL (ref 3.87–5.11)
RDW: 14.2 % (ref 11.5–15.5)
WBC: 8.4 10*3/uL (ref 4.0–10.5)
nRBC: 0 % (ref 0.0–0.2)

## 2020-10-04 LAB — MAGNESIUM: Magnesium: 1.6 mg/dL — ABNORMAL LOW (ref 1.7–2.4)

## 2020-10-04 LAB — PHOSPHORUS: Phosphorus: 1.4 mg/dL — ABNORMAL LOW (ref 2.5–4.6)

## 2020-10-04 MED ORDER — MAGNESIUM SULFATE 2 GM/50ML IV SOLN
2.0000 g | Freq: Once | INTRAVENOUS | Status: AC
  Administered 2020-10-04: 2 g via INTRAVENOUS
  Filled 2020-10-04: qty 50

## 2020-10-04 MED ORDER — POTASSIUM PHOSPHATES 15 MMOLE/5ML IV SOLN
10.0000 mmol | Freq: Once | INTRAVENOUS | Status: AC
  Administered 2020-10-04: 10 mmol via INTRAVENOUS
  Filled 2020-10-04: qty 3.33

## 2020-10-04 NOTE — Progress Notes (Signed)
PROGRESS NOTE    Leah Holland  YOV:[id number] DOB: 1922-06-22 DOA: 10/01/2020 PCP: [name], MD    Brief Narrative:  86 year old female with history of hypertension, hyperlipidemia, known multiple orthopedic issues including right total knee replacement, left total knee replacement, A. fib on Eliquis, bilateral hip prosthesis, chronic hip prosthesis infection on chronic antibiotic therapy brought to the ER with concern about pain on walking with left leg noticed 1 or 2 days.  The hip x-ray was negative.  A CT scan showed nondisplaced fracture of the medial periprosthetic region along the left femur.  Admitted with orthopedic consultation.  Ortho evaluated and recommended nonweightbearing and no plans for surgical intervention.   Assessment & Plan:   Principal Problem:   Leg fracture, left, closed, initial encounter Active Problems:   Hypertension   Hyperlipidemia   Hypothyroidism   Insomnia   Paroxysmal atrial fibrillation (HCC)   Chronic anticoagulation   Clinical depression   Chronic antibiotic suppression  Left periprosthetic femoral shaft fracture: Case seen and discussed with orthopedic surgery, Dr. Erlinda Hong. Recommended nonoperative treatment. Nonweightbearing left lower extremity with walker for 6 weeks, Start working with PT/OT Patient from independent living facility, may need placement to SNF for rehab. Already on Eliquis that will cover for DVT prophylaxis. Continue with pain control .  Paroxysmal A. fib: Currently sinus rhythm.  Continue with Eliquis, Cardizem and metoprolol  Hypertension: Blood pressure stable on chlorthalidone, Cardizem and metoprolol.  Chronic hip prosthesis infection: On chronic Bactrim therapy that she will continue.  Hypothyroidism: Continue with levothyroxine     DVT prophylaxis: apixaban (ELIQUIS) tablet 2.5 mg Start: 10/02/20 1445 Place TED hose Start: 10/01/20 2223 apixaban (ELIQUIS) tablet 2.5 mg   Code Status: Full  code Family Communication: Called sons phones without any answer. Disposition Plan: Patient will need discharge to SNF as soon as arranged by case worker.    Dispo: The patient is from: ALF              Anticipated d/c is to: SNF              Anticipated d/c date is: 2 days              Patient currently is medically stable fo d/c.         Consultants:   Orthopedic  Procedures:   None  Antimicrobials:   Bactrim, chronic therapy   Subjective: Patient is seen and examined at bedside.  She is alert and oriented and not in acute distress.  Denies any unusual pain.  Periprosthetic fracture of left lower extremity.  Ortho recommended conservative management with nonweightbearing for 6 weeks.  Awaiting discharge to SNF. Continue with pain control.  Objective: Vitals:   10/03/20 1624 10/03/20 2040 10/04/20 0322 10/04/20 0750  BP: 135/70 (!) 147/77 134/66 (!) 117/57  Pulse: 84 69 63 69  Resp: 17 16 16 16   Temp: 98.2 F (36.8 C) 98 F (36.7 C) 97.7 F (36.5 C) 98 F (36.7 C)  TempSrc: Oral Oral Oral Oral  SpO2: 96% 94% 96% 95%  Weight:      Height:        Intake/Output Summary (Last 24 hours) at 10/04/2020 1525 Last data filed at 10/04/2020 1500 Gross per 24 hour  Intake 1113.14 ml  Output 375 ml  Net 738.14 ml   Filed Weights   10/01/20 1534 10/01/20 1548  Weight: 59 kg 59 kg    Examination:  General exam: Appears calm, comfortable.  Not in  any distress. Respiratory system: Clear to auscultation. Respiratory effort normal. Cardiovascular system: S1 & S2 heard, RRR.  Gastrointestinal system: Abdomen is nondistended, soft and nontender. No organomegaly or masses felt. Normal bowel sounds heard. Central nervous system: Alert and oriented. No focal neurological deficits. Extremities: Symmetric 5 x 5 power. Patient has some pain on movement of the left leg/painful on range of motion.  No deformity of the lower extremity.     Data Reviewed: I have  personally reviewed following labs and imaging studies  CBC: Recent Labs  Lab 10/01/20 2104 10/01/20 2117 10/02/20 0649 10/04/20 0241  WBC 10.4  --  9.7 8.4  NEUTROABS 7.3  --   --   --   HGB 15.6* 16.7* 14.0 13.1  HCT 46.6* 49.0* 42.4 39.1  MCV 93.2  --  93.2 91.6  PLT 251  --  238 099   Basic Metabolic Panel: Recent Labs  Lab 10/01/20 2117 10/02/20 0649 10/04/20 0241  NA 135 133* 129*  K 3.4* 3.7 3.8  CL 95* 97* 93*  CO2  --  26 29  GLUCOSE 128* 126* 116*  BUN 15 10 11   CREATININE 0.90 0.80 0.81  CALCIUM  --  9.6 9.0  MG  --   --  1.6*  PHOS  --   --  1.4*   GFR: Estimated Creatinine Clearance: 33.6 mL/min (by C-G formula based on SCr of 0.81 mg/dL). Liver Function Tests: Recent Labs  Lab 10/04/20 0241  AST 14*  ALT 12  ALKPHOS 73  BILITOT 0.7  PROT 5.8*  ALBUMIN 3.0*   No results for input(s): LIPASE, AMYLASE in the last 168 hours. No results for input(s): AMMONIA in the last 168 hours. Coagulation Profile: No results for input(s): INR, PROTIME in the last 168 hours. Cardiac Enzymes: No results for input(s): CKTOTAL, CKMB, CKMBINDEX, TROPONINI in the last 168 hours. BNP (last 3 results) No results for input(s): PROBNP in the last 8760 hours. HbA1C: No results for input(s): HGBA1C in the last 72 hours. CBG: No results for input(s): GLUCAP in the last 168 hours. Lipid Profile: No results for input(s): CHOL, HDL, LDLCALC, TRIG, CHOLHDL, LDLDIRECT in the last 72 hours. Thyroid Function Tests: Recent Labs    10/02/20 0649  TSH 1.197   Anemia Panel: No results for input(s): VITAMINB12, FOLATE, FERRITIN, TIBC, IRON, RETICCTPCT in the last 72 hours. Sepsis Labs: No results for input(s): PROCALCITON, LATICACIDVEN in the last 168 hours.  Recent Results (from the past 240 hour(s))  Respiratory Panel by RT PCR (Flu A&B, Covid) - Nasopharyngeal Swab     Status: None   Collection Time: 10/01/20  9:05 PM   Specimen: Nasopharyngeal Swab  Result Value Ref  Range Status   SARS Coronavirus 2 by RT PCR NEGATIVE NEGATIVE Final    Comment: (NOTE) SARS-CoV-2 target nucleic acids are NOT DETECTED.  The SARS-CoV-2 RNA is generally detectable in upper respiratoy specimens during the acute phase of infection. The lowest concentration of SARS-CoV-2 viral copies this assay can detect is 131 copies/mL. A negative result does not preclude SARS-Cov-2 infection and should not be used as the sole basis for treatment or other patient management decisions. A negative result may occur with  improper specimen collection/handling, submission of specimen other than nasopharyngeal swab, presence of viral mutation(s) within the areas targeted by this assay, and inadequate number of viral copies (<131 copies/mL). A negative result must be combined with clinical observations, patient history, and epidemiological information. The expected result is Negative.  Fact Sheet for Patients:  PinkCheek.be  Fact Sheet for Healthcare Providers:  GravelBags.it  This test is no t yet approved or cleared by the [place] FDA and  has been authorized for detection and/or diagnosis of SARS-CoV-2 by FDA under an Emergency Use Authorization (EUA). This EUA will remain  in effect (meaning this test can be used) for the duration of the COVID-19 declaration under Section 564(b)(1) of the Act, 21 U.S.C. section 360bbb-3(b)(1), unless the authorization is terminated or revoked sooner.     Influenza A by PCR NEGATIVE NEGATIVE Final   Influenza B by PCR NEGATIVE NEGATIVE Final    Comment: (NOTE) The Xpert Xpress SARS-CoV-2/FLU/RSV assay is intended as an aid in  the diagnosis of influenza from Nasopharyngeal swab specimens and  should not be used as a sole basis for treatment. Nasal washings and  aspirates are unacceptable for Xpert Xpress SARS-CoV-2/FLU/RSV  testing.  Fact Sheet for  Patients: PinkCheek.be  Fact Sheet for Healthcare Providers: GravelBags.it  This test is not yet approved or cleared by the [place] FDA and  has been authorized for detection and/or diagnosis of SARS-CoV-2 by  FDA under an Emergency Use Authorization (EUA). This EUA will remain  in effect (meaning this test can be used) for the duration of the  Covid-19 declaration under Section 564(b)(1) of the Act, 21  U.S.C. section 360bbb-3(b)(1), unless the authorization is  terminated or revoked. Performed at [facility], [place] [address] [place], [place] [address]          Radiology Studies: No results found.      Scheduled Meds: . apixaban  2.5 mg Oral BID  . chlorthalidone  25 mg Oral Daily  . diltiazem  180 mg Oral Daily  . DULoxetine  60 mg Oral Daily  . feeding supplement  237 mL Oral BID BM  . levothyroxine  112 mcg Oral Q0600  . metoprolol tartrate  50 mg Oral BID  . pantoprazole  40 mg Oral Daily  . polyethylene glycol  17 g Oral Daily  . sulfamethoxazole-trimethoprim  1 tablet Oral BID   Continuous Infusions: . potassium PHOSPHATE IVPB (in mmol) 42 mL/hr at 10/04/20 1500     LOS: 1 day    Time spent: 32 minutes    Elie Confer, MD [facility] Pager [phone]

## 2020-10-04 NOTE — Plan of Care (Signed)
  Problem: Education: Goal: Knowledge of General Education information will improve Description: Including pain rating scale, medication(s)/side effects and non-pharmacologic comfort measures Outcome: Progressing   Problem: Activity: Goal: Risk for activity intolerance will decrease Outcome: Progressing   Problem: Nutrition: Goal: Adequate nutrition will be maintained Outcome: Progressing   

## 2020-10-04 NOTE — Plan of Care (Signed)
  Problem: Education: Goal: Knowledge of General Education information will improve Description: Including pain rating scale, medication(s)/side effects and non-pharmacologic comfort measures Outcome: Progressing   Problem: Health Behavior/Discharge Planning: Goal: Ability to manage health-related needs will improve Outcome: Progressing   Problem: Clinical Measurements: Goal: Will remain free from infection Outcome: Progressing   

## 2020-10-05 DIAGNOSIS — S8292XA Unspecified fracture of left lower leg, initial encounter for closed fracture: Secondary | ICD-10-CM | POA: Diagnosis not present

## 2020-10-05 LAB — URINALYSIS, ROUTINE W REFLEX MICROSCOPIC
Bilirubin Urine: NEGATIVE
Glucose, UA: NEGATIVE mg/dL
Hgb urine dipstick: NEGATIVE
Ketones, ur: NEGATIVE mg/dL
Leukocytes,Ua: NEGATIVE
Nitrite: NEGATIVE
Protein, ur: NEGATIVE mg/dL
Specific Gravity, Urine: 1.012 (ref 1.005–1.030)
pH: 6 (ref 5.0–8.0)

## 2020-10-05 LAB — COMPREHENSIVE METABOLIC PANEL
ALT: 14 U/L (ref 0–44)
AST: 16 U/L (ref 15–41)
Albumin: 2.8 g/dL — ABNORMAL LOW (ref 3.5–5.0)
Alkaline Phosphatase: 69 U/L (ref 38–126)
Anion gap: 8 (ref 5–15)
BUN: 10 mg/dL (ref 8–23)
CO2: 27 mmol/L (ref 22–32)
Calcium: 9 mg/dL (ref 8.9–10.3)
Chloride: 91 mmol/L — ABNORMAL LOW (ref 98–111)
Creatinine, Ser: 0.96 mg/dL (ref 0.44–1.00)
GFR, Estimated: 54 mL/min — ABNORMAL LOW (ref >60)
Glucose, Bld: 112 mg/dL — ABNORMAL HIGH (ref 70–99)
Potassium: 3.5 mmol/L (ref 3.5–5.1)
Sodium: 126 mmol/L — ABNORMAL LOW (ref 135–145)
Total Bilirubin: 0.9 mg/dL (ref 0.3–1.2)
Total Protein: 5.7 g/dL — ABNORMAL LOW (ref 6.5–8.1)

## 2020-10-05 LAB — MAGNESIUM: Magnesium: 1.7 mg/dL (ref 1.7–2.4)

## 2020-10-05 LAB — CBC
HCT: 38.5 % (ref 36.0–46.0)
Hemoglobin: 13.1 g/dL (ref 12.0–15.0)
MCH: 31.6 pg (ref 26.0–34.0)
MCHC: 34 g/dL (ref 30.0–36.0)
MCV: 92.8 fL (ref 80.0–100.0)
Platelets: 221 10*3/uL (ref 150–400)
RBC: 4.15 MIL/uL (ref 3.87–5.11)
RDW: 14.1 % (ref 11.5–15.5)
WBC: 9.9 10*3/uL (ref 4.0–10.5)
nRBC: 0 % (ref 0.0–0.2)

## 2020-10-05 LAB — SODIUM, URINE, RANDOM: Sodium, Ur: 74 mmol/L

## 2020-10-05 LAB — CREATININE, URINE, RANDOM: Creatinine, Urine: 67.84 mg/dL

## 2020-10-05 LAB — PHOSPHORUS: Phosphorus: 2.1 mg/dL — ABNORMAL LOW (ref 2.5–4.6)

## 2020-10-05 LAB — URIC ACID: Uric Acid, Serum: 3.3 mg/dL (ref 2.5–7.1)

## 2020-10-05 LAB — OSMOLALITY: Osmolality: 270 mOsm/kg — ABNORMAL LOW (ref 275–295)

## 2020-10-05 MED ORDER — MAGNESIUM SULFATE IN D5W 1-5 GM/100ML-% IV SOLN
1.0000 g | Freq: Once | INTRAVENOUS | Status: AC
  Administered 2020-10-05: 1 g via INTRAVENOUS
  Filled 2020-10-05: qty 100

## 2020-10-05 MED ORDER — SODIUM CHLORIDE 1 G PO TABS
1.0000 g | ORAL_TABLET | Freq: Two times a day (BID) | ORAL | Status: AC
  Administered 2020-10-05 (×2): 1 g via ORAL
  Filled 2020-10-05 (×2): qty 1

## 2020-10-05 MED ORDER — FUROSEMIDE 20 MG PO TABS
20.0000 mg | ORAL_TABLET | Freq: Once | ORAL | Status: AC
  Administered 2020-10-05: 20 mg via ORAL
  Filled 2020-10-05: qty 1

## 2020-10-05 MED ORDER — K PHOS MONO-SOD PHOS DI & MONO 155-852-130 MG PO TABS
500.0000 mg | ORAL_TABLET | Freq: Three times a day (TID) | ORAL | Status: DC
  Administered 2020-10-05 – 2020-10-06 (×4): 500 mg via ORAL
  Filled 2020-10-05 (×4): qty 2

## 2020-10-05 MED ORDER — POTASSIUM CHLORIDE CRYS ER 20 MEQ PO TBCR
40.0000 meq | EXTENDED_RELEASE_TABLET | Freq: Once | ORAL | Status: AC
  Administered 2020-10-05: 40 meq via ORAL
  Filled 2020-10-05: qty 2

## 2020-10-05 NOTE — Plan of Care (Signed)
  Problem: Education: Goal: Knowledge of General Education information will improve Description: Including pain rating scale, medication(s)/side effects and non-pharmacologic comfort measures Outcome: Progressing   Problem: Clinical Measurements: Goal: Ability to maintain clinical measurements within normal limits will improve Outcome: Progressing Goal: Will remain free from infection Outcome: Progressing   

## 2020-10-05 NOTE — Progress Notes (Signed)
PROGRESS NOTE                                                                                                                                                                                                             Patient Demographics:    Leah Holland, is a 86 y.o. female, DOB - 1922/04/11, ZOX:[id number]  Outpatient Primary MD for the patient is [name], MD    LOS - 2  Admit date - 10/01/2020    Chief Complaint  Patient presents with  . Hip Pain       Brief Narrative (HPI from H&P) - 86 year old female with history of hypertension, hyperlipidemia, known multiple orthopedic issues including right total knee replacement, left total knee replacement, A. fib on Eliquis, bilateral hip prosthesis, chronic hip prosthesis infection on chronic antibiotic therapy brought to the ER with concern about pain on walking with left leg noticed 1 or 2 days.  The hip x-ray was negative.  A CT scan showed nondisplaced fracture of the medial periprosthetic region along the left femur.  Admitted with orthopedic consultation.  Ortho evaluated and recommended nonweightbearing and no plans for surgical intervention.   Subjective:    [name] today has, No headache, No chest pain, No abdominal pain - No Nausea, No new weakness tingling or numbness, no SOB.   Assessment  & Plan :      1.  Left periprosthetic spontaneous femoral shaft fracture.  With history of chronic periprosthetic infection on suppressive antibiotic treatment.  She was seen by orthopedics Dr.Xu who recommended conservative management with nonweightbearing on the left leg for 6 weeks, suppressive antibiotics as before along with supportive care.  Will require SNF.  Thereafter follow with orthopedics.    2.  Paroxysmal atrial fibrillation.  Mali vas 2 score of greater than 3.  Currently in sinus, continue combination of Cardizem and metoprolol, continue Eliquis  for anticoagulation.  3.  Chronic left hip prosthesis infection.  On suppressive Bactrim.  Continue.  4.  Hypothyroidism.  On Synthroid.  5.  Hyponatremia.  Most likely SIADH, will check serum osmolality, uric acid, urine sodium and osmolality.  Gentle Lasix and monitor.  6.  Hypophosphatemia.  Replace.      Condition - Extremely Guarded  Family Communication  :  Son [name] [phone] on 10/05/20 .   Code Status :  DNR  Consults  :  Ortho  Procedures  :    CT - Status post left total hip arthroplasty for treatment of old proximal left femoral trochanteric fracture. There appears to be a new longitudinal nondisplaced fracture in the medial periprosthetic region of the proximal left femur which most likely is acute or subacute.  PUD Prophylaxis : PPI  Disposition Plan  :    Status is: Inpatient  Remains inpatient appropriate because:IV treatments appropriate due to intensity of illness or inability to take PO   Dispo:  Patient From: [facility]  Planned Disposition: [place]  Expected discharge date: 10/06/20  Medically stable for discharge: Yes   DVT Prophylaxis  :  Eliquis  Lab Results  Component Value Date   PLT 221 10/05/2020    Diet :  Diet Order            Diet regular Room service appropriate? Yes; Fluid consistency: Thin  Diet effective now           Diet - low sodium heart healthy                  Inpatient Medications  Scheduled Meds: . apixaban  2.5 mg Oral BID  . chlorthalidone  25 mg Oral Daily  . diltiazem  180 mg Oral Daily  . DULoxetine  60 mg Oral Daily  . feeding supplement  237 mL Oral BID BM  . furosemide  20 mg Oral Once  . levothyroxine  112 mcg Oral Q0600  . metoprolol tartrate  50 mg Oral BID  . pantoprazole  40 mg Oral Daily  . phosphorus  500 mg Oral TID  . polyethylene glycol  17 g Oral Daily  . potassium chloride  40 mEq Oral Once  . sodium chloride  1 g Oral BID WC  .  sulfamethoxazole-trimethoprim  1 tablet Oral BID   Continuous Infusions: PRN Meds:.acetaminophen, temazepam  Antibiotics  :    Anti-infectives (From admission, onward)   Start     Dose/Rate Route Frequency Ordered Stop   10/01/20 2245  sulfamethoxazole-trimethoprim (BACTRIM) 400-80 MG per tablet 1 tablet        1 tablet Oral 2 times daily 10/01/20 2235         Time Spent in minutes  30   [name] M.D on 10/05/2020 at 11:45 AM  To page go to [email or web address] - password [email or web address]  [facility] -  Office  [phone]    See all Orders from today for further details    Objective:   Vitals:   10/04/20 1618 10/04/20 2056 10/05/20 0457 10/05/20 0738  BP: 128/73 (!) 148/79 (!) 170/83 (!) 141/76  Pulse: 75 68 70 83  Resp: 17 17 17 16   Temp: 97.7 F (36.5 C) 98 F (36.7 C) 97.9 F (36.6 C) 98.3 F (36.8 C)  TempSrc: Oral Oral Oral Oral  SpO2: 95% 95% 94% 96%  Weight:      Height:        Wt Readings from Last 3 Encounters:  10/01/20 59 kg  02/12/20 62.1 kg  10/23/19 60.8 kg     Intake/Output Summary (Last 24 hours) at 10/05/2020 1145 Last data filed at 10/05/2020 0700 Gross per 24 hour  Intake 633.14 ml  Output 600 ml  Net 33.14 ml     Physical Exam  Awake Alert, No new F.N deficits, hard of hearing [place].AT,PERRAL Supple Neck,No JVD, No cervical lymphadenopathy appriciated.  Symmetrical Chest wall movement, Good  air movement bilaterally, CTAB RRR,No Gallops,Rubs or new Murmurs, No Parasternal Heave +ve B.Sounds, Abd Soft, No tenderness, No organomegaly appriciated, No rebound - guarding or rigidity. No Cyanosis, Clubbing or edema, No new Rash or bruise     Data Review:    CBC Recent Labs  Lab 10/01/20 2104 10/01/20 2117 10/02/20 0649 10/04/20 0241 10/05/20 0117  WBC 10.4  --  9.7 8.4 9.9  HGB 15.6* 16.7* 14.0 13.1 13.1  HCT 46.6* 49.0* 42.4 39.1 38.5  PLT 251  --  238 220 221  MCV 93.2  --  93.2 91.6 92.8  MCH 31.2  --  30.8 30.7 31.6    MCHC 33.5  --  33.0 33.5 34.0  RDW 14.2  --  14.1 14.2 14.1  LYMPHSABS 2.4  --   --   --   --   MONOABS 0.6  --   --   --   --   EOSABS 0.0  --   --   --   --   BASOSABS 0.1  --   --   --   --     Recent Labs  Lab 10/01/20 2117 10/02/20 0649 10/04/20 0241 10/05/20 0117  NA 135 133* 129* 126*  K 3.4* 3.7 3.8 3.5  CL 95* 97* 93* 91*  CO2  --  26 29 27   GLUCOSE 128* 126* 116* 112*  BUN 15 10 11 10   CREATININE 0.90 0.80 0.81 0.96  CALCIUM  --  9.6 9.0 9.0  AST  --   --  14* 16  ALT  --   --  12 14  ALKPHOS  --   --  73 69  BILITOT  --   --  0.7 0.9  ALBUMIN  --   --  3.0* 2.8*  MG  --   --  1.6* 1.7  TSH  --  1.197  --   --     ------------------------------------------------------------------------------------------------------------------ No results for input(s): CHOL, HDL, LDLCALC, TRIG, CHOLHDL, LDLDIRECT in the last 72 hours.  Lab Results  Component Value Date   HGBA1C 5.9 08/10/2015   ------------------------------------------------------------------------------------------------------------------ No results for input(s): TSH, T4TOTAL, T3FREE, THYROIDAB in the last 72 hours.  Invalid input(s): FREET3  Cardiac Enzymes No results for input(s): CKMB, TROPONINI, MYOGLOBIN in the last 168 hours.  Invalid input(s): CK ------------------------------------------------------------------------------------------------------------------ No results found for: BNP  Micro Results Recent Results (from the past 240 hour(s))  Respiratory Panel by RT PCR (Flu A&B, Covid) - Nasopharyngeal Swab     Status: None   Collection Time: 10/01/20  9:05 PM   Specimen: Nasopharyngeal Swab  Result Value Ref Range Status   SARS Coronavirus 2 by RT PCR NEGATIVE NEGATIVE Final    Comment: (NOTE) SARS-CoV-2 target nucleic acids are NOT DETECTED.  The SARS-CoV-2 RNA is generally detectable in upper respiratoy specimens during the acute phase of infection. The lowest concentration of  SARS-CoV-2 viral copies this assay can detect is 131 copies/mL. A negative result does not preclude SARS-Cov-2 infection and should not be used as the sole basis for treatment or other patient management decisions. A negative result may occur with  improper specimen collection/handling, submission of specimen other than nasopharyngeal swab, presence of viral mutation(s) within the areas targeted by this assay, and inadequate number of viral copies (<131 copies/mL). A negative result must be combined with clinical observations, patient history, and epidemiological information. The expected result is Negative.  Fact Sheet for Patients:  PinkCheek.be  Fact Sheet for Healthcare Providers:  GravelBags.it  This test is no t yet approved or cleared by the [place] and  has been authorized for detection and/or diagnosis of SARS-CoV-2 by FDA under an Emergency Use Authorization (EUA). This EUA will remain  in effect (meaning this test can be used) for the duration of the COVID-19 declaration under Section 564(b)(1) of the Act, 21 U.S.C. section 360bbb-3(b)(1), unless the authorization is terminated or revoked sooner.     Influenza A by PCR NEGATIVE NEGATIVE Final   Influenza B by PCR NEGATIVE NEGATIVE Final    Comment: (NOTE) The Xpert Xpress SARS-CoV-2/FLU/RSV assay is intended as an aid in  the diagnosis of influenza from Nasopharyngeal swab specimens and  should not be used as a sole basis for treatment. Nasal washings and  aspirates are unacceptable for Xpert Xpress SARS-CoV-2/FLU/RSV  testing.  Fact Sheet for Patients: PinkCheek.be  Fact Sheet for Healthcare Providers: GravelBags.it  This test is not yet approved or cleared by the [place] FDA and  has been authorized for detection and/or diagnosis of SARS-CoV-2 by  FDA under an Emergency Use  Authorization (EUA). This EUA will remain  in effect (meaning this test can be used) for the duration of the  Covid-19 declaration under Section 564(b)(1) of the Act, 21  U.S.C. section 360bbb-3(b)(1), unless the authorization is  terminated or revoked. Performed at [facility], [place] [address], [place], [place] [address]     Radiology Reports CT Hip Left Wo Contrast  Result Date: 10/01/2020 CLINICAL DATA:  Left hip pain.  Possible stress fracture. EXAM: CT OF THE LEFT HIP WITHOUT CONTRAST TECHNIQUE: Multidetector CT imaging of the left hip was performed according to the standard protocol. Multiplanar CT image reconstructions were also generated. COMPARISON:  Radiograph of same day.  CT of June 06, 2017. FINDINGS: Status post left total hip arthroplasty for treatment of old proximal left femoral trochanteric fracture. This exam is significantly limited due to scatter artifact arising from the hip prosthesis. There appears to be a new longitudinal nondisplaced fracture in the medial periprosthetic region of the proximal left femur which most likely is acute or subacute. IMPRESSION: Status post left total hip arthroplasty for treatment of old proximal left femoral trochanteric fracture. There appears to be a new longitudinal nondisplaced fracture in the medial periprosthetic region of the proximal left femur which most likely is acute or subacute. Electronically Signed   By: Marijo Conception M.D.   On: 10/01/2020 19:17   DG Hip Unilat W or Wo Pelvis 2-3 Views Left  Result Date: 10/01/2020 CLINICAL DATA:  Pain EXAM: DG HIP (WITH OR WITHOUT PELVIS) 2-3V LEFT COMPARISON:  July 21, 2017 FINDINGS: Frontal pelvis as well as frontal and lateral left hip images were obtained. Patient is status post total hip replacements bilaterally with prosthetic components bilaterally well seated. No acute fracture or dislocation evident. Chronic avulsion of the greater and lesser trochanters on the left with  remodeling. Bony overgrowth along the greater and lesser trochanters on the right, stable. No erosive change. Bones osteoporotic. Multiple foci of arterial atherosclerotic calcification noted. IMPRESSION: Total hip replacements with prosthetic components bilaterally well-seated. Bony remodeling in both proximal femur regions, stable. Underlying osteoporosis. No acute fracture or dislocation. Multiple foci of arterial vascular calcification noted. Electronically Signed   By: Lowella Grip III M.D.   On: 10/01/2020 16:15

## 2020-10-05 NOTE — Progress Notes (Signed)
Occupational Therapy Treatment Patient Details Name: Leah Holland MRN: [id number] DOB: [birth year] Today's Date: 10/05/2020    History of present illness 86 y.o. female with medical history significant for hypertension, hyperlipidemia, as well as family history of heart disease, remote right total knee replacement, recent left knee replacement by Dr. 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 CT of the hip read as status post left total hip arthroplasty for treatment of old proximal left femur trochanteric fracture.  There appears to be a new longitudinal nondisplaced fracture in the medial periprosthetic region of the proximal left femur likely acute or subacute.    OT comments  Pt progressing with OT goals, very pleasant and motivated to work with therapies. Session focused on educating on most appropriate DME to maximize safety during ADL mobility. Pt overall min guard for sit to stand and Min A for short distance mobility to/from [facility] with RW (about 10 ft total). Pt with increasing fatigue with hopping using RW. Educated that wheelchair may be best option for safety/indepedence during ADLs while healing. Pt overall Mod A for toileting tasks today due to difficulty managing clothing in standing secondary to NWB precautions. Plan to trial shorter walker during next session to allow for improved weightbearing through B UE. Pt would also benefit from trial of wheelchair during ADLs.    Follow Up Recommendations  SNF;Supervision/Assistance - 24 hour    Equipment Recommendations  Wheelchair (measurements OT)    Recommendations for Other Services      Precautions / Restrictions Precautions Precautions: Fall Restrictions Weight Bearing Restrictions: Yes LLE Weight Bearing: Non weight bearing       Mobility Bed Mobility                General bed mobility comments: received in chair  Transfers Overall transfer level: Needs assistance Equipment used: Rolling walker (2 wheeled) Transfers: Sit to/from [facility] Sit to Stand: Min guard Stand pivot transfers: Min assist       General transfer comment: min guard for standing/steadying with increased time. Min A for stand pivot for manuevering RW and mainitaining balance    Balance Overall balance assessment: Needs assistance;History of Falls Sitting-balance support: No upper extremity supported Sitting balance-Leahy Scale: Good     Standing balance support: Bilateral upper extremity supported Standing balance-Leahy Scale: Poor Standing balance comment: Reliant on BUE Support                           ADL either performed or assessed with clinical judgement   ADL Overall ADL's : Needs assistance/impaired                       Lower Body Dressing Details (indicate cue type and reason): Educated on lateral leans for donning pants to decrease fall risk/maintain WB precautions Toilet Transfer: Minimal assistance;Ambulation;BSC;RW Toilet Transfer Details (indicate cue type and reason): Min A for manuvering RW,maintaining balance Toileting- Clothing Manipulation and Hygiene: Moderate assistance;Sit to/from stand Toileting - Clothing Manipulation Details (indicate cue type and reason): Mod A overall, needs assist for clothing mgmt around waist due to impaired balance in standing      Functional mobility during ADLs: Minimal assistance;Rolling walker;Cueing for sequencing General ADL Comments: Pt motivated to improve, asking good questions on best DME to use for safe mobility - recommended  wc     Vision   Vision Assessment?: No apparent visual deficits   Perception     Praxis      Cognition Arousal/Alertness: Awake/alert Behavior During Therapy: WFL for tasks assessed/performed Overall Cognitive Status:  Within Functional Limits for tasks assessed                                          Exercises     Shoulder Instructions       General Comments Pt very pleasant, questions about using RW vs wheelchair vs Rollator. May benefit from trial of pediatric walker to improve ability to push through B UE    Pertinent Vitals/ Pain       Pain Assessment: No/denies pain  Home Living                                          Prior Functioning/Environment              Frequency  Min 2X/week        Progress Toward Goals  OT Goals(current goals can now be found in the care plan section)  Progress towards OT goals: Progressing toward goals  Acute Rehab OT Goals Patient Stated Goal: To return to being independent OT Goal Formulation: With patient/family Time For Goal Achievement: 10/16/20 Potential to Achieve Goals: Good ADL Goals Pt Will Perform Lower Body Bathing: with modified independence;sit to/from stand Pt Will Perform Lower Body Dressing: with modified independence;sit to/from stand Pt Will Transfer to Toilet: with modified independence;bedside commode;stand pivot transfer Pt Will Perform Toileting - Clothing Manipulation and hygiene: with modified independence;sit to/from stand Additional ADL Goal #1: Pt will verbalize 3 strategies to reduce risk of falls  Plan Discharge plan remains appropriate    Co-evaluation                 AM-PAC OT "6 Clicks" Daily Activity     Outcome Measure   Help from another person eating meals?: None Help from another person taking care of personal grooming?: A Little Help from another person toileting, which includes using toliet, bedpan, or urinal?: A Little Help from another person bathing (including washing, rinsing, drying)?: A Lot Help from another person to put on and taking off regular upper body clothing?: A Little Help from another person to put on and taking off regular lower body  clothing?: A Lot 6 Click Score: 17    End of Session Equipment Utilized During Treatment: Gait belt;Rolling walker  OT Visit Diagnosis: Unsteadiness on feet (R26.81);Other abnormalities of gait and mobility (R26.89);History of falling (Z91.81);Pain Pain - Right/Left: Left Pain - part of body: Leg   Activity Tolerance Patient tolerated treatment well   Patient Left in chair;with call bell/phone within reach;with chair alarm set   Nurse Communication Mobility status        Time: 0539-7673 OT Time Calculation (min): 35 min  Charges: OT General Charges $OT Visit: 1 Visit OT Treatments $Self Care/Home Management : 8-22 mins $Therapeutic Activity: 8-22 mins  Layla Maw, OTR/L   Layla Maw 10/05/2020, 3:18 PM

## 2020-10-05 NOTE — Progress Notes (Signed)
Physical Therapy Treatment Patient Details Name: Leah Holland MRN: [id number] DOB: [birth year] Today's Date: 10/05/2020    History of Present Illness 86 y.o. female with medical history significant for hypertension, hyperlipidemia, as well as family history of heart disease, remote right total knee replacement, recent left knee replacement by Dr. 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 CT of the hip read as status post left total hip arthroplasty for treatment of old proximal left femur trochanteric fracture.  There appears to be a new longitudinal nondisplaced fracture in the medial periprosthetic region of the proximal left femur likely acute or subacute.     PT Comments    Pt declined to stand and walk with PT, stating both that she had spent enough time in standing during previous therapy session and that she does not feel comfortable she can maintain NWB.  Talked with her about the progression of standing and balance, gait control that her NWB requires, and that the progress will not be instant.  Pt understands but still declines to stand.  Follow acutely for therapy goals, and encourage her to walk as much as tolerated.   Follow Up Recommendations  SNF     Equipment Recommendations  Wheelchair (measurements PT);Wheelchair cushion (measurements PT)    Recommendations for Other Services       Precautions / Restrictions Precautions Precautions: Fall Restrictions Weight Bearing Restrictions: Yes LLE Weight Bearing: Non weight bearing    Mobility  Bed Mobility               General bed mobility comments: in chair  Transfers                    Ambulation/Gait                 Stairs             Wheelchair Mobility    Modified Rankin (Stroke Patients Only)        Balance Overall balance assessment: Needs assistance Sitting-balance support: Feet supported Sitting balance-Leahy Scale: Good                                      Cognition Arousal/Alertness: Awake/alert Behavior During Therapy: WFL for tasks assessed/performed Overall Cognitive Status: Within Functional Limits for tasks assessed                                        Exercises General Exercises - Lower Extremity Ankle Circles/Pumps: AAROM;5 reps Quad Sets: AROM;10 reps Gluteal Sets: AROM;10 reps Hip ABduction/ADduction: AROM;AAROM;10 reps Straight Leg Raises: AAROM;10 reps    General Comments General comments (skin integrity, edema, etc.): pt in agreement with plan to go to rehab      Pertinent Vitals/Pain Pain Assessment: No/denies pain    Home Living                      Prior Function            PT Goals (current goals can now be found in the care plan section) Progress towards PT goals: Not progressing toward goals - comment (declining gait)    Frequency    Min  3X/week      PT Plan Current plan remains appropriate    Co-evaluation              AM-PAC PT "6 Clicks" Mobility   Outcome Measure  Help needed turning from your back to your side while in a flat bed without using bedrails?: None Help needed moving from lying on your back to sitting on the side of a flat bed without using bedrails?: A Little Help needed moving to and from a bed to a chair (including a wheelchair)?: A Little Help needed standing up from a chair using your arms (e.g., wheelchair or bedside chair)?: A Little Help needed to walk in hospital room?: A Little Help needed climbing 3-5 steps with a railing? : Total 6 Click Score: 17    End of Session   Activity Tolerance: Patient tolerated treatment well Patient left: in chair;with call bell/phone within reach Nurse Communication: Mobility status PT Visit Diagnosis: Unsteadiness on  feet (R26.81);Muscle weakness (generalized) (M62.81);Difficulty in walking, not elsewhere classified (R26.2)     Time: 4656-8127 PT Time Calculation (min) (ACUTE ONLY): 23 min  Charges:  $Therapeutic Exercise: 23-37 mins                 Ramond Dial 10/05/2020, 8:06 PM  Mee Hives, PT MS Acute Rehab Dept. Number: [place] and [place]

## 2020-10-05 NOTE — Discharge Instructions (Addendum)
Follow with Primary MD [name], MD in 7 days   Get CBC, CMP, 2 view Chest X ray -  checked next visit within 1 week by Primary MD or SNF MD   Activity: Nonweightbearing in left leg for 6 weeks, Full fall precautions use walker/cane & assistance as needed  Disposition SNF  Diet: Heart Healthy    Special Instructions: If you have smoked or chewed Tobacco  in the last 2 yrs please stop smoking, stop any regular Alcohol  and or any Recreational drug use.  On your next visit with your primary care physician please Get Medicines reviewed and adjusted.  Please request your Prim.MD to go over all Hospital Tests and Procedure/Radiological results at the follow up, please get all Hospital records sent to your Prim MD by signing hospital release before you go home.  If you experience worsening of your admission symptoms, develop shortness of breath, life threatening emergency, suicidal or homicidal thoughts you must seek medical attention immediately by calling 911 or calling your MD immediately  if symptoms less severe.  You Must read complete instructions/literature along with all the possible adverse reactions/side effects for all the Medicines you take and that have been prescribed to you. Take any new Medicines after you have completely understood and accpet all the possible adverse reactions/side effects.       Information on my medicine - ELIQUIS (apixaban)  This medication education was reviewed with me or my healthcare representative as part of my discharge preparation.   Why was Eliquis prescribed for you? Eliquis was prescribed for you to reduce the risk of a blood clot forming that can cause a stroke if you have a medical condition called atrial fibrillation (a type of irregular heartbeat).  What do You need to know about Eliquis ? Take your Eliquis TWICE DAILY - one tablet in the morning and one tablet in the evening with or without food. If you have difficulty swallowing  the tablet whole please discuss with your pharmacist how to take the medication safely.  Take Eliquis exactly as prescribed by your doctor and DO NOT stop taking Eliquis without talking to the doctor who prescribed the medication.  Stopping may increase your risk of developing a stroke.  Refill your prescription before you run out.  After discharge, you should have regular check-up appointments with your healthcare provider that is prescribing your Eliquis.  In the future your dose may need to be changed if your kidney function or weight changes by a significant amount or as you get older.  What do you do if you miss a dose? If you miss a dose, take it as soon as you remember on the same day and resume taking twice daily.  Do not take more than one dose of ELIQUIS at the same time to make up a missed dose.  Important Safety Information A possible side effect of Eliquis is bleeding. You should call your healthcare provider right away if you experience any of the following: ? Bleeding from an injury or your nose that does not stop. ? Unusual colored urine (red or dark brown) or unusual colored stools (red or black). ? Unusual bruising for unknown reasons. ? A serious fall or if you hit your head (even if there is no bleeding).  Some medicines may interact with Eliquis and might increase your risk of bleeding or clotting while on Eliquis. To help avoid this, consult your healthcare provider or pharmacist prior to using any new prescription  or non-prescription medications, including herbals, vitamins, non-steroidal anti-inflammatory drugs (NSAIDs) and supplements.  This website has more information on Eliquis (apixaban): http://www.eliquis.com/eliquis/home

## 2020-10-06 DIAGNOSIS — K440 Diaphragmatic hernia with obstruction, without gangrene: Secondary | ICD-10-CM | POA: Diagnosis not present

## 2020-10-06 DIAGNOSIS — S8292XA Unspecified fracture of left lower leg, initial encounter for closed fracture: Secondary | ICD-10-CM | POA: Diagnosis not present

## 2020-10-06 DIAGNOSIS — D72829 Elevated white blood cell count, unspecified: Secondary | ICD-10-CM | POA: Diagnosis not present

## 2020-10-06 DIAGNOSIS — E039 Hypothyroidism, unspecified: Secondary | ICD-10-CM | POA: Diagnosis not present

## 2020-10-06 DIAGNOSIS — F339 Major depressive disorder, recurrent, unspecified: Secondary | ICD-10-CM | POA: Diagnosis not present

## 2020-10-06 DIAGNOSIS — M21751 Unequal limb length (acquired), right femur: Secondary | ICD-10-CM | POA: Diagnosis not present

## 2020-10-06 DIAGNOSIS — S72092S Other fracture of head and neck of left femur, sequela: Secondary | ICD-10-CM | POA: Diagnosis not present

## 2020-10-06 DIAGNOSIS — R600 Localized edema: Secondary | ICD-10-CM | POA: Diagnosis not present

## 2020-10-06 DIAGNOSIS — Z7401 Bed confinement status: Secondary | ICD-10-CM | POA: Diagnosis not present

## 2020-10-06 DIAGNOSIS — I499 Cardiac arrhythmia, unspecified: Secondary | ICD-10-CM | POA: Diagnosis not present

## 2020-10-06 DIAGNOSIS — S72142K Displaced intertrochanteric fracture of left femur, subsequent encounter for closed fracture with nonunion: Secondary | ICD-10-CM | POA: Diagnosis not present

## 2020-10-06 DIAGNOSIS — T847XXS Infection and inflammatory reaction due to other internal orthopedic prosthetic devices, implants and grafts, sequela: Secondary | ICD-10-CM | POA: Diagnosis not present

## 2020-10-06 DIAGNOSIS — R1312 Dysphagia, oropharyngeal phase: Secondary | ICD-10-CM | POA: Diagnosis not present

## 2020-10-06 DIAGNOSIS — Z792 Long term (current) use of antibiotics: Secondary | ICD-10-CM | POA: Diagnosis not present

## 2020-10-06 DIAGNOSIS — Z4789 Encounter for other orthopedic aftercare: Secondary | ICD-10-CM | POA: Diagnosis not present

## 2020-10-06 DIAGNOSIS — M6281 Muscle weakness (generalized): Secondary | ICD-10-CM | POA: Diagnosis not present

## 2020-10-06 DIAGNOSIS — R1313 Dysphagia, pharyngeal phase: Secondary | ICD-10-CM | POA: Diagnosis not present

## 2020-10-06 DIAGNOSIS — R29898 Other symptoms and signs involving the musculoskeletal system: Secondary | ICD-10-CM | POA: Diagnosis not present

## 2020-10-06 DIAGNOSIS — M25552 Pain in left hip: Secondary | ICD-10-CM | POA: Diagnosis not present

## 2020-10-06 DIAGNOSIS — E876 Hypokalemia: Secondary | ICD-10-CM | POA: Diagnosis not present

## 2020-10-06 DIAGNOSIS — R2681 Unsteadiness on feet: Secondary | ICD-10-CM | POA: Diagnosis not present

## 2020-10-06 DIAGNOSIS — E785 Hyperlipidemia, unspecified: Secondary | ICD-10-CM | POA: Diagnosis not present

## 2020-10-06 DIAGNOSIS — F419 Anxiety disorder, unspecified: Secondary | ICD-10-CM | POA: Diagnosis not present

## 2020-10-06 DIAGNOSIS — Z471 Aftercare following joint replacement surgery: Secondary | ICD-10-CM | POA: Diagnosis not present

## 2020-10-06 DIAGNOSIS — M9702XS Periprosthetic fracture around internal prosthetic left hip joint, sequela: Secondary | ICD-10-CM | POA: Diagnosis not present

## 2020-10-06 DIAGNOSIS — E1300 Other specified diabetes mellitus with hyperosmolarity without nonketotic hyperglycemic-hyperosmolar coma (NKHHC): Secondary | ICD-10-CM | POA: Diagnosis not present

## 2020-10-06 DIAGNOSIS — K5900 Constipation, unspecified: Secondary | ICD-10-CM | POA: Diagnosis not present

## 2020-10-06 DIAGNOSIS — F5101 Primary insomnia: Secondary | ICD-10-CM | POA: Diagnosis not present

## 2020-10-06 DIAGNOSIS — M25551 Pain in right hip: Secondary | ICD-10-CM | POA: Diagnosis not present

## 2020-10-06 DIAGNOSIS — E782 Mixed hyperlipidemia: Secondary | ICD-10-CM | POA: Diagnosis not present

## 2020-10-06 DIAGNOSIS — Z23 Encounter for immunization: Secondary | ICD-10-CM | POA: Diagnosis not present

## 2020-10-06 DIAGNOSIS — R41841 Cognitive communication deficit: Secondary | ICD-10-CM | POA: Diagnosis not present

## 2020-10-06 DIAGNOSIS — R278 Other lack of coordination: Secondary | ICD-10-CM | POA: Diagnosis not present

## 2020-10-06 DIAGNOSIS — I10 Essential (primary) hypertension: Secondary | ICD-10-CM | POA: Diagnosis not present

## 2020-10-06 DIAGNOSIS — M9702XD Periprosthetic fracture around internal prosthetic left hip joint, subsequent encounter: Secondary | ICD-10-CM | POA: Diagnosis not present

## 2020-10-06 DIAGNOSIS — W19XXXA Unspecified fall, initial encounter: Secondary | ICD-10-CM | POA: Diagnosis not present

## 2020-10-06 DIAGNOSIS — I4891 Unspecified atrial fibrillation: Secondary | ICD-10-CM | POA: Diagnosis not present

## 2020-10-06 DIAGNOSIS — S72402D Unspecified fracture of lower end of left femur, subsequent encounter for closed fracture with routine healing: Secondary | ICD-10-CM | POA: Diagnosis not present

## 2020-10-06 DIAGNOSIS — I480 Paroxysmal atrial fibrillation: Secondary | ICD-10-CM | POA: Diagnosis not present

## 2020-10-06 DIAGNOSIS — S72145K Nondisplaced intertrochanteric fracture of left femur, subsequent encounter for closed fracture with nonunion: Secondary | ICD-10-CM | POA: Diagnosis not present

## 2020-10-06 DIAGNOSIS — M7989 Other specified soft tissue disorders: Secondary | ICD-10-CM | POA: Diagnosis not present

## 2020-10-06 DIAGNOSIS — M810 Age-related osteoporosis without current pathological fracture: Secondary | ICD-10-CM | POA: Diagnosis not present

## 2020-10-06 DIAGNOSIS — R296 Repeated falls: Secondary | ICD-10-CM | POA: Diagnosis not present

## 2020-10-06 DIAGNOSIS — G4700 Insomnia, unspecified: Secondary | ICD-10-CM | POA: Diagnosis not present

## 2020-10-06 DIAGNOSIS — E871 Hypo-osmolality and hyponatremia: Secondary | ICD-10-CM | POA: Diagnosis not present

## 2020-10-06 DIAGNOSIS — R531 Weakness: Secondary | ICD-10-CM | POA: Diagnosis not present

## 2020-10-06 DIAGNOSIS — K5909 Other constipation: Secondary | ICD-10-CM | POA: Diagnosis not present

## 2020-10-06 DIAGNOSIS — M2550 Pain in unspecified joint: Secondary | ICD-10-CM | POA: Diagnosis not present

## 2020-10-06 DIAGNOSIS — K219 Gastro-esophageal reflux disease without esophagitis: Secondary | ICD-10-CM | POA: Diagnosis not present

## 2020-10-06 DIAGNOSIS — M1990 Unspecified osteoarthritis, unspecified site: Secondary | ICD-10-CM | POA: Diagnosis not present

## 2020-10-06 LAB — CBC WITH DIFFERENTIAL/PLATELET
Abs Immature Granulocytes: 0.05 10*3/uL (ref 0.00–0.07)
Basophils Absolute: 0 10*3/uL (ref 0.0–0.1)
Basophils Relative: 1 %
Eosinophils Absolute: 0.2 10*3/uL (ref 0.0–0.5)
Eosinophils Relative: 3 %
HCT: 40.3 % (ref 36.0–46.0)
Hemoglobin: 13.7 g/dL (ref 12.0–15.0)
Immature Granulocytes: 1 %
Lymphocytes Relative: 29 %
Lymphs Abs: 2.5 10*3/uL (ref 0.7–4.0)
MCH: 30.8 pg (ref 26.0–34.0)
MCHC: 34 g/dL (ref 30.0–36.0)
MCV: 90.6 fL (ref 80.0–100.0)
Monocytes Absolute: 1 10*3/uL (ref 0.1–1.0)
Monocytes Relative: 12 %
Neutro Abs: 4.7 10*3/uL (ref 1.7–7.7)
Neutrophils Relative %: 54 %
Platelets: 251 10*3/uL (ref 150–400)
RBC: 4.45 MIL/uL (ref 3.87–5.11)
RDW: 13.9 % (ref 11.5–15.5)
WBC: 8.5 10*3/uL (ref 4.0–10.5)
nRBC: 0 % (ref 0.0–0.2)

## 2020-10-06 LAB — COMPREHENSIVE METABOLIC PANEL
ALT: 15 U/L (ref 0–44)
AST: 18 U/L (ref 15–41)
Albumin: 3 g/dL — ABNORMAL LOW (ref 3.5–5.0)
Alkaline Phosphatase: 78 U/L (ref 38–126)
Anion gap: 9 (ref 5–15)
BUN: 15 mg/dL (ref 8–23)
CO2: 29 mmol/L (ref 22–32)
Calcium: 9.4 mg/dL (ref 8.9–10.3)
Chloride: 92 mmol/L — ABNORMAL LOW (ref 98–111)
Creatinine, Ser: 0.88 mg/dL (ref 0.44–1.00)
GFR, Estimated: >60 mL/min (ref >60)
Glucose, Bld: 105 mg/dL — ABNORMAL HIGH (ref 70–99)
Potassium: 3.3 mmol/L — ABNORMAL LOW (ref 3.5–5.1)
Sodium: 130 mmol/L — ABNORMAL LOW (ref 135–145)
Total Bilirubin: 0.9 mg/dL (ref 0.3–1.2)
Total Protein: 5.8 g/dL — ABNORMAL LOW (ref 6.5–8.1)

## 2020-10-06 LAB — MAGNESIUM: Magnesium: 1.6 mg/dL — ABNORMAL LOW (ref 1.7–2.4)

## 2020-10-06 LAB — PHOSPHORUS: Phosphorus: 3.3 mg/dL (ref 2.5–4.6)

## 2020-10-06 LAB — BRAIN NATRIURETIC PEPTIDE: B Natriuretic Peptide: 139 pg/mL — ABNORMAL HIGH (ref 0.0–100.0)

## 2020-10-06 MED ORDER — POTASSIUM CHLORIDE CRYS ER 20 MEQ PO TBCR
40.0000 meq | EXTENDED_RELEASE_TABLET | Freq: Once | ORAL | Status: AC
  Administered 2020-10-06: 40 meq via ORAL
  Filled 2020-10-06: qty 2

## 2020-10-06 MED ORDER — MAGNESIUM SULFATE 2 GM/50ML IV SOLN
2.0000 g | Freq: Once | INTRAVENOUS | Status: AC
  Administered 2020-10-06: 2 g via INTRAVENOUS
  Filled 2020-10-06: qty 50

## 2020-10-06 NOTE — Progress Notes (Signed)
1800 Discharged pt to SNF, [facility] via [facility]. Tried calling for report to [facility] but no answer.

## 2020-10-06 NOTE — Discharge Summary (Signed)
Leah Holland EHM:[id number] DOB: 07-04-1922 DOA: 10/01/2020  PCP: Leah Shanks, MD  Admit date: 10/01/2020  Discharge date: 10/06/2020  Admitted From: ALF   Disposition:  SNF   Recommendations for Outpatient Follow-up:   Follow up with PCP in 1-2 weeks  PCP Please obtain BMP/CBC, 2 view CXR in 1week,  (see Discharge instructions)   PCP Please follow up on the following pending results: Please make sure she follows with orthopedics within 2 to 3 weeks.  Please check CBC, BMP, magnesium and phosphorus levels in 7 to 10 days.   Home Health: None   Equipment/Devices: None  Consultations: Ortho Discharge Condition: Stable    CODE STATUS: DNR   Diet Recommendation: Heart Healthy   Diet Order            Diet - low sodium heart healthy           Diet regular Room service appropriate? Yes; Fluid consistency: Thin  Diet effective now           Diet - low sodium heart healthy                  Chief Complaint  Patient presents with  . Hip Pain     Brief history of present illness from the day of admission and additional interim summary    86 year old female with history of hypertension, hyperlipidemia, known multiple orthopedic issues including right total knee replacement, left total knee replacement, A. fib on Eliquis, bilateral hip prosthesis, chronic hip prosthesis infection on chronic antibiotic therapy brought to the ER with concern about pain on walking with left leg noticed 1 or 2 days. The hip x-ray was negative. A CT scan showed nondisplaced fracture of the medial periprosthetic region along the left femur. Admitted with orthopedic consultation.Ortho evaluated and recommended nonweightbearing and no plans for surgical intervention.                                                                  Hospital Course      1.  Left periprosthetic spontaneous femoral shaft fracture.  With history of chronic periprosthetic infection on suppressive antibiotic treatment.  She was seen by orthopedics Dr.Xu who recommended conservative management with nonweightbearing on the left leg for 6 weeks, suppressive antibiotics as before along with supportive care.    Discharge to SNF, she must follow with orthopedics in 2 to 3 weeks post discharge.    2.  Paroxysmal atrial fibrillation.  Mali vas 2 score of greater than 3.  Currently in sinus, continue combination of Cardizem and metoprolol, continue Eliquis for anticoagulation.  3.  Chronic left hip prosthesis infection.  On suppressive Bactrim.  Continue.  4.  Hypothyroidism.  On Synthroid.  5.  Hyponatremia.    Due to SIADH improved  after fluid restriction and single dose Lasix.  6.  Hypophosphatemia.  Replaced and stable.  7.  Hypomagnesemia and hypokalemia.  Replaced.  SNF MD to recheck in 1 week.   Discharge diagnosis     Principal Problem:   Leg fracture, left, closed, initial encounter Active Problems:   Hypertension   Hyperlipidemia   Hypothyroidism   Insomnia   Paroxysmal atrial fibrillation (HCC)   Chronic anticoagulation   Clinical depression   Chronic antibiotic suppression   Periprosthetic fracture around internal prosthetic left hip joint [facility])    Discharge instructions    Discharge Instructions    Call MD for:  severe uncontrolled pain   Complete by: As directed    Diet - low sodium heart healthy   Complete by: As directed    Diet - low sodium heart healthy   Complete by: As directed    Discharge instructions   Complete by: As directed    Nonweightbearing left lower extremity.   Discharge instructions   Complete by: As directed    Follow with Primary MD Leah Shanks, MD in 7 days   Get CBC, CMP, 2 view Chest X ray -  checked next visit within 1 week by Primary MD or SNF MD   Activity:  Nonweightbearing in left leg for 6 weeks, Full fall precautions use walker/cane & assistance as needed  Disposition SNF  Diet: Heart Healthy    Special Instructions: If you have smoked or chewed Tobacco  in the last 2 yrs please stop smoking, stop any regular Alcohol  and or any Recreational drug use.  On your next visit with your primary care physician please Get Medicines reviewed and adjusted.  Please request your Prim.MD to go over all Hospital Tests and Procedure/Radiological results at the follow up, please get all Hospital records sent to your Prim MD by signing hospital release before you go home.  If you experience worsening of your admission symptoms, develop shortness of breath, life threatening emergency, suicidal or homicidal thoughts you must seek medical attention immediately by calling 911 or calling your MD immediately  if symptoms less severe.  You Must read complete instructions/literature along with all the possible adverse reactions/side effects for all the Medicines you take and that have been prescribed to you. Take any new Medicines after you have completely understood and accpet all the possible adverse reactions/side effects.   Increase activity slowly   Complete by: As directed       Discharge Medications   Allergies as of 10/06/2020      Reactions   Levaquin [levofloxacin] Other (See Comments)   tendinopathy   Morphine And Related Other (See Comments)   Made pt "wild"      Medication List    STOP taking these medications   ibuprofen 200 MG tablet Commonly known as: ADVIL     TAKE these medications   acetaminophen 325 MG tablet Commonly known as: TYLENOL Take 650 mg by mouth every 6 (six) hours as needed for moderate pain.   chlorthalidone 25 MG tablet Commonly known as: HYGROTON TAKE 1 TABLET ONCE DAILY.   Cholecalciferol 50 MCG (2000 UT) Caps Take 2,000 Units by mouth every evening.   denosumab 60 MG/ML Soln injection Commonly known as:  PROLIA Inject 60 mg into the skin every 6 (six) months. Administer in upper arm, thigh, or abdomen   diltiazem 180 MG 24 hr capsule Commonly known as: CARDIZEM CD TAKE 1 CAPSULE DAILY.   DULoxetine 60 MG capsule  Commonly known as: CYMBALTA Take 60 mg by mouth daily.   Eliquis 2.5 MG Tabs tablet Generic drug: apixaban TAKE 1 TABLET BY MOUTH TWICE DAILY. What changed: how much to take   Flaxseed Oil 1000 MG Caps Take 1,000 mg by mouth 2 (two) times daily.   furosemide 20 MG tablet Commonly known as: Lasix Take 1 tablet (20 mg total) by mouth as needed for edema.   levothyroxine 112 MCG tablet Commonly known as: SYNTHROID Take 112 mcg by mouth daily before breakfast.   MACULAR HEALTH FORMULA PO Take 1 tablet by mouth in the morning and at bedtime.   metoprolol tartrate 50 MG tablet Commonly known as: LOPRESSOR Take 50 mg by mouth 2 (two) times daily.   OVER THE COUNTER MEDICATION Take 25 mg by mouth daily. CBD   potassium chloride 10 MEQ tablet Commonly known as: KLOR-CON Take 1 tablet (10 mEq total) by mouth as needed. Take one tablet as needed when giving lasix as needed   sulfamethoxazole-trimethoprim 400-80 MG tablet Commonly known as: BACTRIM Take 1 tablet by mouth 2 (two) times daily.   temazepam 30 MG capsule Commonly known as: RESTORIL Take 1 capsule (30 mg total) by mouth at bedtime as needed for sleep.        Follow-up Information    Leandrew Koyanagi, MD Follow up in 3 week(s).   Specialty: Orthopedic Surgery Why: for femur fracture follow up Contact information: [place] [address] [phone]        Leah Shanks, MD. Schedule an appointment as soon as possible for a visit in 1 week(s).   Specialty: Family Medicine Contact information: [place] [address] [phone]        Lorretta Harp, MD .   Specialties: Cardiology, Radiology Contact information: [address] [place] [address] [phone]               Major procedures and Radiology Reports - PLEASE review detailed and final reports thoroughly  -         CT Hip Left Wo Contrast  Result Date: 10/01/2020 CLINICAL DATA:  Left hip pain.  Possible stress fracture. EXAM: CT OF THE LEFT HIP WITHOUT CONTRAST TECHNIQUE: Multidetector CT imaging of the left hip was performed according to the standard protocol. Multiplanar CT image reconstructions were also generated. COMPARISON:  Radiograph of same day.  CT of June 06, 2017. FINDINGS: Status post left total hip arthroplasty for treatment of old proximal left femoral trochanteric fracture. This exam is significantly limited due to scatter artifact arising from the hip prosthesis. There appears to be a new longitudinal nondisplaced fracture in the medial periprosthetic region of the proximal left femur which most likely is acute or subacute. IMPRESSION: Status post left total hip arthroplasty for treatment of old proximal left femoral trochanteric fracture. There appears to be a new longitudinal nondisplaced fracture in the medial periprosthetic region of the proximal left femur which most likely is acute or subacute. Electronically Signed   By: Marijo Conception M.D.   On: 10/01/2020 19:17   DG Hip Unilat W or Wo Pelvis 2-3 Views Left  Result Date: 10/01/2020 CLINICAL DATA:  Pain EXAM: DG HIP (WITH OR WITHOUT PELVIS) 2-3V LEFT COMPARISON:  July 21, 2017 FINDINGS: Frontal pelvis as well as frontal and lateral left hip images were obtained. Patient is status post total hip replacements bilaterally with prosthetic components bilaterally well seated. No acute fracture or dislocation  evident. Chronic avulsion of the greater and lesser trochanters on the left with remodeling. Bony overgrowth along the greater and lesser trochanters on the right, stable. No erosive change. Bones osteoporotic. Multiple foci of arterial atherosclerotic calcification noted.  IMPRESSION: Total hip replacements with prosthetic components bilaterally well-seated. Bony remodeling in both proximal femur regions, stable. Underlying osteoporosis. No acute fracture or dislocation. Multiple foci of arterial vascular calcification noted. Electronically Signed   By: Lowella Grip III M.D.   On: 10/01/2020 16:15    Micro Results     Recent Results (from the past 240 hour(s))  Respiratory Panel by RT PCR (Flu A&B, Covid) - Nasopharyngeal Swab     Status: None   Collection Time: 10/01/20  9:05 PM   Specimen: Nasopharyngeal Swab  Result Value Ref Range Status   SARS Coronavirus 2 by RT PCR NEGATIVE NEGATIVE Final    Comment: (NOTE) SARS-CoV-2 target nucleic acids are NOT DETECTED.  The SARS-CoV-2 RNA is generally detectable in upper respiratoy specimens during the acute phase of infection. The lowest concentration of SARS-CoV-2 viral copies this assay can detect is 131 copies/mL. A negative result does not preclude SARS-Cov-2 infection and should not be used as the sole basis for treatment or other patient management decisions. A negative result may occur with  improper specimen collection/handling, submission of specimen other than nasopharyngeal swab, presence of viral mutation(s) within the areas targeted by this assay, and inadequate number of viral copies (<131 copies/mL). A negative result must be combined with clinical observations, patient history, and epidemiological information. The expected result is Negative.  Fact Sheet for Patients:  PinkCheek.be  Fact Sheet for Healthcare Providers:  GravelBags.it  This test is no t yet approved or cleared by the [place] FDA and  has been authorized for detection and/or diagnosis of SARS-CoV-2 by FDA under an Emergency Use Authorization (EUA). This EUA will remain  in effect (meaning this test can be used) for the duration of the COVID-19 declaration  under Section 564(b)(1) of the Act, 21 U.S.C. section 360bbb-3(b)(1), unless the authorization is terminated or revoked sooner.     Influenza A by PCR NEGATIVE NEGATIVE Final   Influenza B by PCR NEGATIVE NEGATIVE Final    Comment: (NOTE) The Xpert Xpress SARS-CoV-2/FLU/RSV assay is intended as an aid in  the diagnosis of influenza from Nasopharyngeal swab specimens and  should not be used as a sole basis for treatment. Nasal washings and  aspirates are unacceptable for Xpert Xpress SARS-CoV-2/FLU/RSV  testing.  Fact Sheet for Patients: PinkCheek.be  Fact Sheet for Healthcare Providers: GravelBags.it  This test is not yet approved or cleared by the [place] FDA and  has been authorized for detection and/or diagnosis of SARS-CoV-2 by  FDA under an Emergency Use Authorization (EUA). This EUA will remain  in effect (meaning this test can be used) for the duration of the  Covid-19 declaration under Section 564(b)(1) of the Act, 21  U.S.C. section 360bbb-3(b)(1), unless the authorization is  terminated or revoked. Performed at [facility], [place] [address] [place], [place] [address]     Today   Subjective    Jaelah Hauth today has no headache,no chest abdominal pain,no new weakness tingling or numbness, feels much better      Objective   Blood pressure 125/65, pulse 72, temperature 97.8 F (36.6 C), temperature source Oral, resp. rate 17, height 5\' 3"  (1.6 m), weight 59 kg, SpO2 97 %.   Intake/Output Summary (Last 24 hours) at  10/06/2020 1039 Last data filed at 10/05/2020 1900 Gross per 24 hour  Intake 480 ml  Output --  Net 480 ml    Exam  Awake Alert, No new F.N deficits, Normal affect [place].AT,PERRAL Supple Neck,No JVD, No cervical lymphadenopathy appriciated.  Symmetrical Chest wall movement, Good air movement bilaterally, CTAB RRR,No Gallops,Rubs or new Murmurs, No Parasternal Heave +ve  B.Sounds, Abd Soft, Non tender, No organomegaly appriciated, No rebound -guarding or rigidity. No Cyanosis, Clubbing or edema, No new Rash or bruise   Data Review   CBC w Diff:  Lab Results  Component Value Date   WBC 8.5 10/06/2020   HGB 13.7 10/06/2020   HCT 40.3 10/06/2020   PLT 251 10/06/2020   LYMPHOPCT 29 10/06/2020   MONOPCT 12 10/06/2020   EOSPCT 3 10/06/2020   BASOPCT 1 10/06/2020    CMP:  Lab Results  Component Value Date   NA 130 (L) 10/06/2020   NA 137 11/20/2018   K 3.3 (L) 10/06/2020   CL 92 (L) 10/06/2020   CO2 29 10/06/2020   BUN 15 10/06/2020   BUN 17 11/20/2018   CREATININE 0.88 10/06/2020   CREATININE 0.98 (H) 10/23/2019   GLU 95 06/13/2017   PROT 5.8 (L) 10/06/2020   ALBUMIN 3.0 (L) 10/06/2020   BILITOT 0.9 10/06/2020   ALKPHOS 78 10/06/2020   AST 18 10/06/2020   ALT 15 10/06/2020  .   Total Time in preparing paper work, data evaluation and todays exam - 68 minutes  [name] M.D on 10/06/2020 at 10:39 AM  [facility]   Office  [phone]

## 2020-10-06 NOTE — TOC Transition Note (Signed)
Transition of Care [facility]) - CM/SW Discharge Note   Patient Details  Name: Leah Holland MRN: [id number] Date of Birth: 04-15-1922  Transition of Care [facility]) CM/SW Contact:  Emeterio Reeve, [place] Phone Number: 10/06/2020, 1:03 PM   Clinical Narrative:     Pt will [facility] to [facility]. [facility] stated pt did not need a new covid test. Pt has been notified.   Nurse to call report to [phone].  Final next level of care: Skilled Nursing Facility Barriers to Discharge: Barriers Resolved   Patient Goals and CMS Choice   CMS Medicare.gov Compare Post Acute Care list provided to:: Patient Choice offered to / list presented to : Patient  Discharge Placement              Patient chooses bed at: [facility] Patient to be transferred to facility by: [facility] Name of family member notified: Patient Patient and family notified of of transfer: 10/06/20  Discharge Plan and Services                                     Social Determinants of Health (SDOH) Interventions     Readmission Risk Interventions No flowsheet data found.  Emeterio Reeve, Latanya Presser, [place] Social Worker [phone]

## 2020-10-06 NOTE — Progress Notes (Signed)
Physical Therapy Treatment Patient Details Name: Leah Holland MRN: [id number] DOB: 11/26/1922 Today's Date: 10/06/2020    History of Present Illness 86 y.o. female with medical history significant for hypertension, hyperlipidemia, as well as family history of heart disease, remote right total knee replacement, recent left knee replacement by Dr. 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 CT of the hip read as status post left total hip arthroplasty for treatment of old proximal left femur trochanteric fracture.  There appears to be a new longitudinal nondisplaced fracture in the medial periprosthetic region of the proximal left femur likely acute or subacute.     PT Comments    Pt with good participation and tolerance for mobility, primary session focus on progressing safety with transfers, seated/standing BLE therex and review of WB restrictions. Pt with good compliance with NWB LLE restriction. Slightly lower RW utilized this session however pt still unable to hop on RLE. Defer OOBTC transfer due to impending discharge, pt seated up in bed for a snack at end of session. Pt continues to benefit from PT services to progress toward functional mobility goals. D/C recs below remain appropriate.   Follow Up Recommendations  SNF     Equipment Recommendations  Wheelchair (measurements PT);Wheelchair cushion (measurements PT)    Recommendations for Other Services       Precautions / Restrictions Precautions Precautions: Fall Restrictions Weight Bearing Restrictions: Yes LLE Weight Bearing: Non weight bearing    Mobility  Bed Mobility Overal bed mobility: Needs Assistance Bed Mobility: Supine to Sit;Sit to Supine     Supine to sit: Supervision Sit to supine: Supervision   General bed mobility comments: use of bed  features/rails  Transfers Overall transfer level: Needs assistance Equipment used: Rolling walker (2 wheeled) Transfers: Sit to/from Stand Sit to Stand: Min guard;Min assist         General transfer comment: increased time to perform, pt practiced x5 total reps with cues for technique/safe hand placement  Ambulation/Gait Ambulation/Gait assistance: Min assist Gait Distance (Feet): 3 Feet Assistive device: Rolling walker (2 wheeled) Gait Pattern/deviations: Shuffle (rotating heel-toe pattern for lateral shuffle using RLE only) Gait velocity: Decreased   General Gait Details: pt unable to hop/deweight RLE; shuffled heel-toe pattern, pt able to move AD on her own   Stairs             Wheelchair Mobility    Modified Rankin (Stroke Patients Only)       Balance Overall balance assessment: Needs assistance Sitting-balance support: Feet supported Sitting balance-Leahy Scale: Good     Standing balance support: Bilateral upper extremity supported Standing balance-Leahy Scale: Poor Standing balance comment: Reliant on BUE Support                            Cognition Arousal/Alertness: Awake/alert Behavior During Therapy: WFL for tasks assessed/performed Overall Cognitive Status: Within Functional Limits for tasks assessed                                        Exercises General Exercises - Lower Extremity Ankle Circles/Pumps: AROM;Both;10 reps Long Arc Quad: AROM;Both;10 reps;Seated Other Exercises Other Exercises: STS x 5 reps in a row    General Comments General comments (skin integrity, edema,  etc.): pt fully dressed and prepared for transport to rehab      Pertinent Vitals/Pain Pain Assessment: Faces Pain Score: 2  Faces Pain Scale: Hurts a little bit Pain Location:  L hip and knee Pain Descriptors / Indicators: Discomfort;Guarding Pain Intervention(s): Monitored during session;Repositioned   Vitals:   10/06/20 0846  BP:  125/65  Pulse: 72  Resp: 17  Temp: 97.8 F (36.6 C)  SpO2: 97%    Home Living                      Prior Function            PT Goals (current goals can now be found in the care plan section) Acute Rehab PT Goals Patient Stated Goal: to return to being independent PT Goal Formulation: With patient/family Time For Goal Achievement: 10/16/20 Potential to Achieve Goals: Good Progress towards PT goals: Progressing toward goals    Frequency    Min 3X/week      PT Plan Current plan remains appropriate    Co-evaluation              AM-PAC PT "6 Clicks" Mobility   Outcome Measure  Help needed turning from your back to your side while in a flat bed without using bedrails?: None Help needed moving from lying on your back to sitting on the side of a flat bed without using bedrails?: A Little Help needed moving to and from a bed to a chair (including a wheelchair)?: A Little Help needed standing up from a chair using your arms (e.g., wheelchair or bedside chair)?: A Little Help needed to walk in hospital room?: A Lot Help needed climbing 3-5 steps with a railing? : Total 6 Click Score: 16    End of Session Equipment Utilized During Treatment: Gait belt Activity Tolerance: Patient tolerated treatment well Patient left: in bed;with bed alarm set;with call bell/phone within reach Nurse Communication: Mobility status PT Visit Diagnosis: Unsteadiness on feet (R26.81);Muscle weakness (generalized) (M62.81);Difficulty in walking, not elsewhere classified (R26.2)     Time: 2863-8177 PT Time Calculation (min) (ACUTE ONLY): 25 min  Charges:  $Therapeutic Exercise: 8-22 mins $Therapeutic Activity: 8-22 mins                     Kadedra Vanaken P., PTA Acute Rehabilitation Services Pager: [phone] Office: [place] 10/06/2020, 12:55 PM

## 2020-10-07 ENCOUNTER — Non-Acute Institutional Stay (SKILLED_NURSING_FACILITY): Payer: Medicare Other | Admitting: Nurse Practitioner

## 2020-10-07 ENCOUNTER — Encounter: Payer: Self-pay | Admitting: Nurse Practitioner

## 2020-10-07 DIAGNOSIS — E876 Hypokalemia: Secondary | ICD-10-CM | POA: Insufficient documentation

## 2020-10-07 DIAGNOSIS — I480 Paroxysmal atrial fibrillation: Secondary | ICD-10-CM

## 2020-10-07 DIAGNOSIS — E039 Hypothyroidism, unspecified: Secondary | ICD-10-CM

## 2020-10-07 DIAGNOSIS — M7989 Other specified soft tissue disorders: Secondary | ICD-10-CM

## 2020-10-07 DIAGNOSIS — G4700 Insomnia, unspecified: Secondary | ICD-10-CM | POA: Diagnosis not present

## 2020-10-07 DIAGNOSIS — Z792 Long term (current) use of antibiotics: Secondary | ICD-10-CM | POA: Diagnosis not present

## 2020-10-07 DIAGNOSIS — I10 Essential (primary) hypertension: Secondary | ICD-10-CM | POA: Diagnosis not present

## 2020-10-07 DIAGNOSIS — E871 Hypo-osmolality and hyponatremia: Secondary | ICD-10-CM | POA: Diagnosis not present

## 2020-10-07 DIAGNOSIS — M9702XD Periprosthetic fracture around internal prosthetic left hip joint, subsequent encounter: Secondary | ICD-10-CM

## 2020-10-07 DIAGNOSIS — E782 Mixed hyperlipidemia: Secondary | ICD-10-CM

## 2020-10-07 DIAGNOSIS — M810 Age-related osteoporosis without current pathological fracture: Secondary | ICD-10-CM

## 2020-10-07 DIAGNOSIS — K5900 Constipation, unspecified: Secondary | ICD-10-CM

## 2020-10-07 DIAGNOSIS — F339 Major depressive disorder, recurrent, unspecified: Secondary | ICD-10-CM

## 2020-10-07 NOTE — Progress Notes (Signed)
Location:   SNF [place] Room Number: 35 Place of Service:  SNF (31) Provider: Lennie Odor Aviel Davalos NP  [name], MD  Patient Care Team: [name], MD as PCP - General (Family Medicine) Lorretta Harp, MD as PCP - Cardiology (Cardiology) Earlie Server, MD as Consulting Physician (Orthopedic Surgery) Lorretta Harp, MD as Consulting Physician (Cardiology) Calvert Cantor, MD as Consulting Physician (Ophthalmology) [name], MD as Attending Physician [facility] Medicine)  Extended Emergency Contact Information Primary Emergency Contact: Ponciano Ort Address: [address]          [place], [place] [address] Johnnette Litter of [place] Phone: [phone] Relation: Son Secondary Emergency Contact: [place], [place] [address] Johnnette Litter of [place] Phone: [phone] Relation: Son  Code Status: DNR Goals of care: Advanced Directive information Advanced Directives 10/03/2020  Does Patient Have a Medical Advance Directive? No  Type of Advance Directive -  Does patient want to make changes to medical advance directive? -  Copy of [place] in Chart? -  Would patient like information on creating a medical advance directive? No - Patient declined  Pre-existing out of facility DNR order (yellow form or pink MOST form) -     Chief Complaint  Patient presents with  . Acute Visit    Medication review    HPI:  Pt is a 86 y.o. female seen today for an acute visit for review medications following hospitalization 10/01/20-10/06/20 for left hip pain, negative Xray, CT showed non displaced fx of the medial periprosthetic region along the left femur, Ortho recommended non weight bearing x 6 weeks, no surgical intervention, f/u Ortho 2-3 weeks, will need f/u CBC, BMP, Mg, phosphorus level. Also noted 2 view CXR in one week per discharge summery, no CXR done during the hospital stay, no apparent respiratory  complaints presently.    HTN, takes Chlorthalidone.   Hyperlipidemia, not taking statin  OA multiple sites, s/p R+L total knee replacement, bilateral hip prosthesis, chronic hip prosthesis  infection on chronic Bactrim 400/56m bid.  Afib, takes Eliquis CMalivas 2 score >3, takes Diltiazem, Metoprolol  Hypothyroidism, takes Levothyroxine, TSH 1.197 10/02/20  Hyponatremia, SIADH, fluid restriction, Na 130 10/06/20  Hypophosphatemia, replace, f/u P level one week. P 3.3 10/06/20  Hypomagnesemia, replaced, f/u Mg level one week, Mg 1.6 10/06/20  Hypokalemia, takes Kcl prn, K 3.3 10/06/20  Insomnia, takes Temazepam 346mprn  Edema BLE, prn Furosemide. Chlorthalidone 2573md. BNP 139 10/06/20. No echocardiogram located.   Depression, takes Duloxetine 21m42m.   OP, takes Prolia, Vit D, cannot locate DEXA       Past Medical History:  Diagnosis Date  . Anxiety attack    "take RX prn" (08/13/2013)  . Arthritis    "probably" (08/13/2013)  . Atrial fibrillation (HCC)[place] new onset/pt report 08/13/2013  . Bronchitis 12/2011  . Complication of anesthesia   . Depression   . Diabetes mellitus type 2, diet-controlled (HCC)[place] denies  . Femur fracture (HCC)    left proximal femur non-union  . GERD (gastroesophageal reflux disease)   . H/O hiatal hernia   . Hardware complicating wound infection (HCC)[place]11/2017  . History of blood transfusion    "w/both knee OR's" (08/13/2013)  . Hyperlipidemia   . Hyperlipidemia   . Hypertension   . Hypertensive urgency 10/22/2018  . Hypothyroidism    takes synthroid  . Insomnia   .  Intertrochanteric fracture of right hip ([place]) 07/30/2015  . Neuromuscular disorder ([place])    carpal tunnel in right hand  . Periprosthetic fracture around internal prosthetic right hip joint ([place]), nonunion basicervical femoral neck with broken hardware and subtrochanteric fracture 03/16/2016  . PONV (postoperative nausea and vomiting)   . Prosthetic hip infection ([place]) 08/22/2016  .  Proteus infection 08/22/2016  . Serratia infection 11/14/2016  . Skin cancer of face 2000's   "one on each side of my face and one on my nose" (08/13/2013)  . Swelling of extremity    sees Dr. Gwenlyn Found, cardiologist 1 x year [phone]  . Tendon dysfunction 08/22/2016  . Wears hearing aid    [facility]   Past Surgical History:  Procedure Laterality Date  . 2D ECHOCARDIOGRAM  12/07/2004   EF 48%, moderate pulmonary hypertension  . BREAST BIOPSY Left ?1970's  . CARDIOVASCULAR STRESS TEST  12/07/11  . CARDIOVASCULAR STRESS TEST  12/07/2011   LEXISCAN, normal scan, no significant wall abnormalities noted  . CARPAL TUNNEL RELEASE Right ~ 2005  . CATARACT EXTRACTION, BILATERAL Bilateral 2000  . DILATION AND CURETTAGE OF UTERUS  1950's  . DOPPLER ECHOCARDIOGRAPHY  12/07/11  . EYE SURGERY    . FEMUR IM NAIL Right 08/01/2015   Procedure: INTRAMEDULLARY (IM) NAIL FEMORAL;  Surgeon: Marchia Bond, MD;  Location: [place];  Service: Orthopedics;  Laterality: Right;  . HARDWARE REMOVAL Right 03/16/2016   Procedure: HARDWARE REMOVAL;  Surgeon: Marchia Bond, MD;  Location: [place];  Service: Orthopedics;  Laterality: Right;  . INTRAMEDULLARY (IM) NAIL INTERTROCHANTERIC Left 01/26/2017   Procedure: INTRAMEDULLARY (IM) NAIL INTERTROCHANTRIC;  Surgeon: Leandrew Koyanagi, MD;  Location: [place];  Service: Orthopedics;  Laterality: Left;  . JOINT REPLACEMENT     bilateral knee replacement  . SKIN CANCER EXCISION  2000's   "off my nose" (08/13/2013)  . TOTAL HIP ARTHROPLASTY Right 03/16/2016   Procedure: TOTAL HIP ARTHROPLASTY;  Surgeon: Marchia Bond, MD;  Location: [place];  Service: Orthopedics;  Laterality: Right;  . TOTAL HIP ARTHROPLASTY Left 06/09/2017   Procedure: REMOVE LEFT HIP INTRAMEDULLARY NAIL AND CONVERT TO LEFT CEMENTED HEMI HIP ARTHROPLASTY;  Surgeon: Leandrew Koyanagi, MD;  Location: [place];  Service: Orthopedics;  Laterality: Left;  . TOTAL KNEE ARTHROPLASTY Right 2005  . TOTAL KNEE ARTHROPLASTY  03/02/2012   Procedure:  TOTAL KNEE ARTHROPLASTY;  Surgeon: Yvette Rack., MD;  Location: [place];  Service: Orthopedics;  Laterality: Left;    Allergies  Allergen Reactions  . Levaquin [Levofloxacin] Other (See Comments)    tendinopathy  . Morphine And Related Other (See Comments)    Made pt "wild"    Allergies as of 10/07/2020      Reactions   Levaquin [levofloxacin] Other (See Comments)   tendinopathy   Morphine And Related Other (See Comments)   Made pt "wild"      Medication List       Accurate as of October 07, 2020 11:59 PM. If you have any questions, ask your nurse or doctor.        STOP taking these medications   furosemide 20 MG tablet Commonly known as: Lasix Stopped by: Akari Defelice X Verna Hamon, NP   MACULAR HEALTH FORMULA PO Stopped by: Khalaya Mcgurn X Baden Betsch, NP   OVER THE COUNTER MEDICATION Stopped by: Vaughn Beaumier X Benicia Bergevin, NP   potassium chloride 10 MEQ tablet Commonly known as: KLOR-CON Stopped by: Perrin Eddleman X Malacai Grantz, NP   sulfamethoxazole-trimethoprim 400-80 MG tablet Commonly known as: BACTRIM  Stopped by: Tanaysia Bhardwaj X Jolea Dolle, NP     TAKE these medications   acetaminophen 325 MG tablet Commonly known as: TYLENOL Take 650 mg by mouth every 6 (six) hours as needed for moderate pain.   chlorthalidone 25 MG tablet Commonly known as: HYGROTON TAKE 1 TABLET ONCE DAILY.   Cholecalciferol 50 MCG (2000 UT) Caps Take 2,000 Units by mouth every evening.   denosumab 60 MG/ML Soln injection Commonly known as: PROLIA Inject 60 mg into the skin every 6 (six) months. Administer in upper arm, thigh, or abdomen   diltiazem 180 MG 24 hr capsule Commonly known as: CARDIZEM CD TAKE 1 CAPSULE DAILY.   DULoxetine 60 MG capsule Commonly known as: CYMBALTA Take 60 mg by mouth daily.   Eliquis 2.5 MG Tabs tablet Generic drug: apixaban TAKE 1 TABLET BY MOUTH TWICE DAILY. What changed: how much to take   Flaxseed Oil 1000 MG Caps Take 1,000 mg by mouth 2 (two) times daily.   levothyroxine 112 MCG tablet Commonly known as:  SYNTHROID Take 112 mcg by mouth daily before breakfast.   metoprolol tartrate 50 MG tablet Commonly known as: LOPRESSOR Take 50 mg by mouth 2 (two) times daily.   temazepam 30 MG capsule Commonly known as: RESTORIL Take 1 capsule (30 mg total) by mouth at bedtime as needed for sleep.       Review of Systems  Constitutional: Negative for activity change, appetite change and fever.  HENT: Positive for hearing loss. Negative for congestion, mouth sores, rhinorrhea, sore throat and trouble swallowing.   Eyes:       Wears glasses   Respiratory: Negative for cough, chest tightness, shortness of breath and wheezing.   Cardiovascular: Positive for leg swelling. Negative for chest pain and palpitations.  Gastrointestinal: Positive for constipation. Negative for abdominal pain, nausea and vomiting.       Last bowel movement was this morning. Has GERD but is managed with medication  Genitourinary: Negative for dysuria and frequency.  Musculoskeletal: Positive for arthralgias and gait problem. Negative for back pain.       Has history of fracture and replacement of right hip and left hip. Pain medication is helping with her left hip pain.   Skin: Negative for color change.  Neurological: Negative for tremors, seizures, syncope, light-headedness and headaches.  Hematological: Does not bruise/bleed easily.  Psychiatric/Behavioral: Positive for sleep disturbance. Negative for behavioral problems and confusion. The patient is nervous/anxious.     Immunization History  Administered Date(s) Administered  . Influenza Whole 03/05/2014  . Influenza,inj,Quad PF,6+ Mos 10/12/2012, 08/22/2016, 10/16/2017  . Influenza-Unspecified 08/27/2013  . PPD Test 10/16/2012  . Pneumococcal Polysaccharide-23 11/07/2011  . Unspecified SARS-COV-2 Vaccination 12/16/2019, 01/13/2020  . Zoster Recombinat (Shingrix) 11/28/2017, 02/26/2018   Pertinent  Health Maintenance Due  Topic Date Due  . FOOT EXAM  Never done    . OPHTHALMOLOGY EXAM  Never done  . URINE MICROALBUMIN  Never done  . DEXA SCAN  Never done  . PNA vac Low Risk Adult (2 of 2 - PCV13) 11/06/2012  . HEMOGLOBIN A1C  02/09/2016  . INFLUENZA VACCINE  07/12/2020   Fall Risk  10/23/2019 10/16/2017 03/27/2017 11/14/2016 08/22/2016  Falls in the past year? 0 Yes No No No  Comment - - - - 07/2015  Number falls in past yr: 0 1 - - -  Injury with Fall? 0 Yes - - -  Risk for fall due to : - History of fall(s) - Impaired mobility Impaired mobility  Follow up - - - - -   Functional Status Survey:    Vitals:   10/07/20 1426  BP: 140/82  Pulse: 63  Resp: (!) 22  Temp: 98.4 F (36.9 C)  SpO2: 95%  Weight: 131 lb 6.4 oz (59.6 kg)  Height: _0  (1.6 m)   Body mass index is 23.28 kg/m. Physical Exam Constitutional:      Appearance: Normal appearance. She is well-developed. She is not diaphoretic.  HENT:     Head: Normocephalic and atraumatic.     Nose: Nose normal.     Mouth/Throat:     Mouth: Mucous membranes are moist.  Eyes:     Conjunctiva/sclera: Conjunctivae normal.     Pupils: Pupils are equal, round, and reactive to light.  Neck:     Thyroid: No thyromegaly.  Cardiovascular:     Rate and Rhythm: Normal rate and regular rhythm.     Heart sounds: No murmur heard.   Pulmonary:     Effort: Pulmonary effort is normal.     Breath sounds: Normal breath sounds.  Abdominal:     General: Bowel sounds are normal.     Palpations: Abdomen is soft.     Tenderness: There is no abdominal tenderness.  Musculoskeletal:     Cervical back: Normal range of motion and neck supple.     Right lower leg: Edema present.     Left lower leg: Edema present.     Comments: Can move all 4 extremities, ROM with left hip limited, able to bear weight to both LE, contact guard assist with transfers, using a walker with gait belt with therapy, trace leg edema. Intact dorsalis pedis.  Lymphadenopathy:     Cervical: No cervical adenopathy.  Skin:     General: Skin is warm and dry.  Neurological:     Mental Status: She is alert and oriented to person, place, and time. Mental status is at baseline.     Motor: No weakness.     Coordination: Coordination normal.     Gait: Gait abnormal.  Psychiatric:        Mood and Affect: Mood normal.        Behavior: Behavior normal.     Labs reviewed: Recent Labs    10/04/20 0241 10/05/20 0117 10/06/20 0201  NA 129* 126* 130*  K 3.8 3.5 3.3*  CL 93* 91* 92*  CO2 _1 GLUCOSE 116* 112* 105*  BUN _2 CREATININE 0.81 0.96 0.88  CALCIUM 9.0 9.0 9.4  MG 1.6* 1.7 1.6*  PHOS 1.4* 2.1* 3.3   Recent Labs    10/04/20 0241 10/05/20 0117 10/06/20 0201  AST 14* 16 18  ALT _3 ALKPHOS 73 69 78  BILITOT 0.7 0.9 0.9  PROT 5.8* 5.7* 5.8*  ALBUMIN 3.0* 2.8* 3.0*   Recent Labs    10/23/19 1043 10/23/19 1043 10/01/20 2104 10/01/20 2117 10/04/20 0241 10/05/20 0117 10/06/20 0201  WBC 6.7   < > 10.4   < > 8.4 9.9 8.5  NEUTROABS 3,544  --  7.3  --   --   --  4.7  HGB 13.2   < > 15.6*   < > 13.1 13.1 13.7  HCT 39.2   < > 46.6*   < > 39.1 38.5 40.3  MCV 88.1   < > 93.2   < > 91.6 92.8 90.6  PLT 301   < > 251   < > 220 221  251   < > = values in this interval not displayed.   Lab Results  Component Value Date   TSH 1.197 10/02/2020   Lab Results  Component Value Date   HGBA1C 5.9 08/10/2015   Lab Results  Component Value Date   CHOL 116 04/26/2013   HDL 45 04/26/2013   LDLCALC 60 04/26/2013   TRIG 56 04/26/2013   CHOLHDL 2.6 04/26/2013    Significant Diagnostic Results in last 30 days:  CT Hip Left Wo Contrast  Result Date: 10/01/2020 CLINICAL DATA:  Left hip pain.  Possible stress fracture. EXAM: CT OF THE LEFT HIP WITHOUT CONTRAST TECHNIQUE: Multidetector CT imaging of the left hip was performed according to the standard protocol. Multiplanar CT image reconstructions were also generated. COMPARISON:  Radiograph of same day.  CT of June 06, 2017. FINDINGS:  Status post left total hip arthroplasty for treatment of old proximal left femoral trochanteric fracture. This exam is significantly limited due to scatter artifact arising from the hip prosthesis. There appears to be a new longitudinal nondisplaced fracture in the medial periprosthetic region of the proximal left femur which most likely is acute or subacute. IMPRESSION: Status post left total hip arthroplasty for treatment of old proximal left femoral trochanteric fracture. There appears to be a new longitudinal nondisplaced fracture in the medial periprosthetic region of the proximal left femur which most likely is acute or subacute. Electronically Signed   By: Marijo Conception M.D.   On: 10/01/2020 19:17   DG Hip Unilat W or Wo Pelvis 2-3 Views Left  Result Date: 10/01/2020 CLINICAL DATA:  Pain EXAM: DG HIP (WITH OR WITHOUT PELVIS) 2-3V LEFT COMPARISON:  July 21, 2017 FINDINGS: Frontal pelvis as well as frontal and lateral left hip images were obtained. Patient is status post total hip replacements bilaterally with prosthetic components bilaterally well seated. No acute fracture or dislocation evident. Chronic avulsion of the greater and lesser trochanters on the left with remodeling. Bony overgrowth along the greater and lesser trochanters on the right, stable. No erosive change. Bones osteoporotic. Multiple foci of arterial atherosclerotic calcification noted. IMPRESSION: Total hip replacements with prosthetic components bilaterally well-seated. Bony remodeling in both proximal femur regions, stable. Underlying osteoporosis. No acute fracture or dislocation. Multiple foci of arterial vascular calcification noted. Electronically Signed   By: Lowella Grip III M.D.   On: 10/01/2020 16:15    Assessment/Plan: Periprosthetic fracture around internal prosthetic left hip joint ([place]) following hospitalization 10/01/20-10/06/20 for left hip pain, negative Xray, CT showed non displaced fx of the medial  periprosthetic region along the left femur, Ortho recommended non weight bearing x 6 weeks, no surgical intervention, f/u Ortho 2-3 weeks, will need f/u CBC, BMP, Mg, phosphorus level, will delay 2 view CXR, monitor for respiratory symptoms.    Hypertension HTN, takes Chlorthalidone.    Hyperlipidemia Hyperlipidemia, not taking statin,  Chronic antibiotic suppression OA multiple sites, s/p R+L total knee replacement, bilateral hip prosthesis, chronic hip prosthesis  infection on chronic Bactrim 400/45m bid.  Paroxysmal atrial fibrillation (HCC) Afib, takes Eliquis CMalivas 2 score >3, takes Diltiazem, Metoprolol   Hypothyroidism Hypothyroidism, takes Levothyroxine, TSH 1.197 10/02/20   Hyponatremia Hyponatremia, SIADH, fluid restriction, Na 130 10/06/20   Hypophosphatemia Hypophosphatemia, replace, f/u P level one week. P 3.3 10/06/20   Hypomagnesemia Hypomagnesemia, replaced, f/u Mg level one week, Mg 1.6 10/06/20   Hypokalemia Hypokalemia, takes Kcl prn, K 3.3 10/06/20   Insomnia Insomnia, takes Temazepam 348mprn   Swelling of  extremity Edema BLE, prn Furosemide for wt gain #2-3Ibs/day, #3-5Ibs/week. Chlorthalidone 72m qd. BNP 139 10/06/20   Depression, recurrent (HCC) Depression, takes Duloxetine 614mqd.   OP (osteoporosis) OP, takes Prolia, Vit D,   Constipation Senokot S II qhs.     Family/ staff Communication: plan of care reviewed with the patient and charge nurse.   Labs/tests ordered: CBC/diff, CMP/eGFR  Time spend 35 minutes.

## 2020-10-07 NOTE — Assessment & Plan Note (Addendum)
following hospitalization 10/01/20-10/06/20 for left hip pain, negative Xray, CT showed non displaced fx of the medial periprosthetic region along the left femur, Ortho recommended non weight bearing x 6 weeks, no surgical intervention, f/u Ortho 2-3 weeks, will need f/u CBC, BMP, Mg, phosphorus level, will delay 2 view CXR, monitor for respiratory symptoms.

## 2020-10-07 NOTE — Assessment & Plan Note (Signed)
Hyperlipidemia, not taking statin,

## 2020-10-07 NOTE — Assessment & Plan Note (Signed)
Depression, takes Duloxetine 60mg  qd.

## 2020-10-07 NOTE — Assessment & Plan Note (Signed)
Hypothyroidism, takes Levothyroxine, TSH 1.197 10/02/20

## 2020-10-07 NOTE — Assessment & Plan Note (Signed)
Hyponatremia, SIADH, fluid restriction, Na 130 10/06/20

## 2020-10-07 NOTE — Assessment & Plan Note (Signed)
HTN, takes Chlorthalidone.

## 2020-10-07 NOTE — Assessment & Plan Note (Signed)
OP, takes Prolia, Vit D,

## 2020-10-07 NOTE — Assessment & Plan Note (Signed)
Hypophosphatemia, replace, f/u P level one week. P 3.3 10/06/20

## 2020-10-07 NOTE — Assessment & Plan Note (Signed)
OA multiple sites, s/p R+L total knee replacement, bilateral hip prosthesis, chronic hip prosthesis  infection on chronic Bactrim 400/8mg  bid.

## 2020-10-07 NOTE — Assessment & Plan Note (Signed)
Afib, takes Eliquis [place] vas 2 score >3, takes Diltiazem, Metoprolol

## 2020-10-07 NOTE — Assessment & Plan Note (Signed)
Hypokalemia, takes Kcl prn, K 3.3 10/06/20

## 2020-10-07 NOTE — Assessment & Plan Note (Signed)
Edema BLE, prn Furosemide for wt gain #2-3Ibs/day, #3-5Ibs/week. Chlorthalidone 25mg  qd. BNP 139 10/06/20

## 2020-10-07 NOTE — Assessment & Plan Note (Signed)
Hypomagnesemia, replaced, f/u Mg level one week, Mg 1.6 10/06/20

## 2020-10-07 NOTE — Assessment & Plan Note (Signed)
Senokot S II qhs.

## 2020-10-07 NOTE — Assessment & Plan Note (Signed)
Insomnia, takes Temazepam 30mg  prn

## 2020-10-08 ENCOUNTER — Encounter: Payer: Self-pay | Admitting: Nurse Practitioner

## 2020-10-13 ENCOUNTER — Encounter: Payer: Self-pay | Admitting: Internal Medicine

## 2020-10-13 ENCOUNTER — Non-Acute Institutional Stay (SKILLED_NURSING_FACILITY): Payer: Medicare Other | Admitting: Internal Medicine

## 2020-10-13 DIAGNOSIS — E039 Hypothyroidism, unspecified: Secondary | ICD-10-CM

## 2020-10-13 DIAGNOSIS — F339 Major depressive disorder, recurrent, unspecified: Secondary | ICD-10-CM | POA: Diagnosis not present

## 2020-10-13 DIAGNOSIS — M810 Age-related osteoporosis without current pathological fracture: Secondary | ICD-10-CM | POA: Diagnosis not present

## 2020-10-13 DIAGNOSIS — I480 Paroxysmal atrial fibrillation: Secondary | ICD-10-CM

## 2020-10-13 DIAGNOSIS — I10 Essential (primary) hypertension: Secondary | ICD-10-CM

## 2020-10-13 DIAGNOSIS — Z792 Long term (current) use of antibiotics: Secondary | ICD-10-CM

## 2020-10-13 DIAGNOSIS — M9702XD Periprosthetic fracture around internal prosthetic left hip joint, subsequent encounter: Secondary | ICD-10-CM

## 2020-10-13 DIAGNOSIS — E871 Hypo-osmolality and hyponatremia: Secondary | ICD-10-CM | POA: Diagnosis not present

## 2020-10-13 DIAGNOSIS — E876 Hypokalemia: Secondary | ICD-10-CM

## 2020-10-13 NOTE — Progress Notes (Signed)
Provider:  Veleta Miners, MD Location:   [facility] Room Number: 95 A Place of Service:  SNF (31)  PCP: [name], MD Patient Care Team: [name], MD as PCP - General (Family Medicine) Lorretta Harp, MD as PCP - Cardiology (Cardiology) Earlie Server, MD as Consulting Physician (Orthopedic Surgery) Lorretta Harp, MD as Consulting Physician (Cardiology) Calvert Cantor, MD as Consulting Physician (Ophthalmology) [name], MD as Attending Physician [facility])  Extended Emergency Contact Information Primary Emergency Contact: Ponciano Ort Address: [address]          [place], [place] [address] Johnnette Litter of [place] Mobile Phone: [phone] Relation: Son Secondary Emergency Contact: [place], [place] [address] Johnnette Litter of [place] Phone: [phone] Relation: Son  Code Status: DNR Goals of Care: Advanced Directive information Advanced Directives 10/13/2020  Does Patient Have a Medical Advance Directive? Yes  Type of Advance Directive Living will  Does patient want to make changes to medical advance directive? No - Patient declined  Copy of [place] in Chart? -  Would patient like information on creating a medical advance directive? -  Pre-existing out of facility DNR order (yellow form or pink MOST form) -      Chief Complaint  Patient presents with  . Readmit To SNF    Readmission to SNF  . Best Practice Recommendations    Tetanus/Tdap, Pneumonia vaccine  . Quality Metric Gaps    Foot exam, eye exam, Urine microalbumin, Hemoglobin A1C and DEXA scan    HPI: Patient is a 86 y.o. female seen today for admission to SNF for therapy  Patient was admitted in the hospital from 10/21-10/26 for left wrist headache spontaneous femoral shaft fracture managed conservatively. Patient has previous history of bilateral knee replacement and bilateral hip prosthesis  with chronic hip prosthesis infection on chronic antibiotic therapy, hypertension, hyperlipidemia, PAF on Eliquis.  Also history of osteoporosis.  On Prolia  Patient presented with left hip pain.  Initial x-ray was negative but the CT scan of the hip showed new nonnutritional nondisplaced fracture in the medial periprostatic region. Orthopedics recommended conservative management by NWB and therapy for strengthening. Patient is doing well in SNF.  Pain seems to be controlled.  Working with therapy  Past Medical History:  Diagnosis Date  . Anxiety attack    "take RX prn" (08/13/2013)  . Arthritis    "probably" (08/13/2013)  . Atrial fibrillation ([place])    new onset/pt report 08/13/2013  . Bronchitis 12/2011  . Complication of anesthesia   . Depression   . Diabetes mellitus type 2, diet-controlled ([place])    denies  . Femur fracture (HCC)    left proximal femur non-union  . GERD (gastroesophageal reflux disease)   . H/O hiatal hernia   . Hardware complicating wound infection ([place]) 08/22/2016  . History of blood transfusion    "w/both knee OR's" (08/13/2013)  . Hyperlipidemia   . Hyperlipidemia   . Hypertension   . Hypertensive urgency 10/22/2018  . Hypothyroidism    takes synthroid  . Insomnia   . Intertrochanteric fracture of right hip ([place]) 07/30/2015  . Neuromuscular disorder ([place])    carpal tunnel in right hand  . Periprosthetic fracture around internal prosthetic right hip joint ([place]), nonunion basicervical femoral neck with broken hardware and subtrochanteric fracture 03/16/2016  . PONV (postoperative nausea and vomiting)   . Prosthetic hip infection ([place])  08/22/2016  . Proteus infection 08/22/2016  . Serratia infection 11/14/2016  . Skin cancer of face 2000's   "one on each side of my face and one on my nose" (08/13/2013)  . Swelling of extremity    sees Dr. Gwenlyn Found, cardiologist 1 x year [phone]  . Tendon dysfunction 08/22/2016  . Wears hearing aid    [facility]   Past Surgical History:    Procedure Laterality Date  . 2D ECHOCARDIOGRAM  12/07/2004   EF 48%, moderate pulmonary hypertension  . BREAST BIOPSY Left ?1970's  . CARDIOVASCULAR STRESS TEST  12/07/11  . CARDIOVASCULAR STRESS TEST  12/07/2011   LEXISCAN, normal scan, no significant wall abnormalities noted  . CARPAL TUNNEL RELEASE Right ~ 2005  . CATARACT EXTRACTION, BILATERAL Bilateral 2000  . DILATION AND CURETTAGE OF UTERUS  1950's  . DOPPLER ECHOCARDIOGRAPHY  12/07/11  . EYE SURGERY    . FEMUR IM NAIL Right 08/01/2015   Procedure: INTRAMEDULLARY (IM) NAIL FEMORAL;  Surgeon: Marchia Bond, MD;  Location: [facility];  Service: Orthopedics;  Laterality: Right;  . HARDWARE REMOVAL Right 03/16/2016   Procedure: HARDWARE REMOVAL;  Surgeon: Marchia Bond, MD;  Location: [facility];  Service: Orthopedics;  Laterality: Right;  . INTRAMEDULLARY (IM) NAIL INTERTROCHANTERIC Left 01/26/2017   Procedure: INTRAMEDULLARY (IM) NAIL INTERTROCHANTRIC;  Surgeon: Leandrew Koyanagi, MD;  Location: [facility];  Service: Orthopedics;  Laterality: Left;  . JOINT REPLACEMENT     bilateral knee replacement  . SKIN CANCER EXCISION  2000's   "off my nose" (08/13/2013)  . TOTAL HIP ARTHROPLASTY Right 03/16/2016   Procedure: TOTAL HIP ARTHROPLASTY;  Surgeon: Marchia Bond, MD;  Location: [facility];  Service: Orthopedics;  Laterality: Right;  . TOTAL HIP ARTHROPLASTY Left 06/09/2017   Procedure: REMOVE LEFT HIP INTRAMEDULLARY NAIL AND CONVERT TO LEFT CEMENTED HEMI HIP ARTHROPLASTY;  Surgeon: Leandrew Koyanagi, MD;  Location: [facility];  Service: Orthopedics;  Laterality: Left;  . TOTAL KNEE ARTHROPLASTY Right 2005  . TOTAL KNEE ARTHROPLASTY  03/02/2012   Procedure: TOTAL KNEE ARTHROPLASTY;  Surgeon: Yvette Rack., MD;  Location: [facility];  Service: Orthopedics;  Laterality: Left;    reports that she has never smoked. She has never used smokeless tobacco. She reports that she does not drink alcohol and does not use drugs. Social History   Socioeconomic History  . Marital status:  Widowed    Spouse name: Not on file  . Number of children: Not on file  . Years of education: Not on file  . Highest education level: Not on file  Occupational History  . Not on file  Tobacco Use  . Smoking status: Never Smoker  . Smokeless tobacco: Never Used  Substance and Sexual Activity  . Alcohol use: No  . Drug use: No  . Sexual activity: Never  Other Topics Concern  . Not on file  Social History Narrative   Widowed. Lives at [facility]. Son died in the [place] War and she has not slept well since then.   Admitted to [facility] 01/30/17   Never smoked   Alcohol none   Full Code   Social Determinants of Health   Financial Resource Strain:   . Difficulty of Paying Living Expenses: Not on file  Food Insecurity:   . Worried About Charity fundraiser in the Last Year: Not on file  . Ran Out of Food in the Last Year: Not on file  Transportation Needs:   . Lack of Transportation (  Medical): Not on file  . Lack of Transportation (Non-Medical): Not on file  Physical Activity:   . Days of Exercise per Week: Not on file  . Minutes of Exercise per Session: Not on file  Stress:   . Feeling of Stress : Not on file  Social Connections:   . Frequency of Communication with Friends and Family: Not on file  . Frequency of Social Gatherings with Friends and Family: Not on file  . Attends Religious Services: Not on file  . Active Member of Clubs or Organizations: Not on file  . Attends Archivist Meetings: Not on file  . Marital Status: Not on file  Intimate Partner Violence:   . Fear of Current or Ex-Partner: Not on file  . Emotionally Abused: Not on file  . Physically Abused: Not on file  . Sexually Abused: Not on file    Functional Status Survey:    Family History  Problem Relation Age of Onset  . Arrhythmia Mother   . Heart disease Mother   . Arrhythmia Sister   . Stroke Father 43  . Heart disease Brother   . Heart disease Brother   .  Cancer Brother        Colon cancer  . Arrhythmia Brother   . Heart disease Brother   . Heart disease Sister   . Heart disease Sister   . Stroke Sister   . Diabetes Sister   . Heart disease Child 20  . Heart disease Child 85    Health Maintenance  Topic Date Due  . FOOT EXAM  Never done  . OPHTHALMOLOGY EXAM  Never done  . URINE MICROALBUMIN  Never done  . TETANUS/TDAP  Never done  . DEXA SCAN  Never done  . PNA vac Low Risk Adult (2 of 2 - PCV13) 11/06/2012  . HEMOGLOBIN A1C  02/09/2016  . INFLUENZA VACCINE  Completed  . COVID-19 Vaccine  Completed    Allergies  Allergen Reactions  . Levaquin [Levofloxacin] Other (See Comments)    tendinopathy  . Morphine And Related Other (See Comments)    Made pt "wild"    Allergies as of 10/13/2020      Reactions   Levaquin [levofloxacin] Other (See Comments)   tendinopathy   Morphine And Related Other (See Comments)   Made pt "wild"      Medication List       Accurate as of October 13, 2020  9:52 AM. If you have any questions, ask your nurse or doctor.        acetaminophen 325 MG tablet Commonly known as: TYLENOL Take 650 mg by mouth every 6 (six) hours as needed for moderate pain.   chlorthalidone 25 MG tablet Commonly known as: HYGROTON TAKE 1 TABLET ONCE DAILY.   Cholecalciferol 50 MCG (2000 UT) Caps Take 2,000 Units by mouth every evening.   denosumab 60 MG/ML Soln injection Commonly known as: PROLIA Inject 60 mg into the skin every 6 (six) months. Administer in upper arm, thigh, or abdomen   diltiazem 180 MG 24 hr capsule Commonly known as: CARDIZEM CD TAKE 1 CAPSULE DAILY.   DULoxetine 60 MG capsule Commonly known as: CYMBALTA Take 60 mg by mouth daily.   Eliquis 2.5 MG Tabs tablet Generic drug: apixaban TAKE 1 TABLET BY MOUTH TWICE DAILY. What changed: how much to take   Flaxseed Oil 1000 MG Caps Take 1,000 mg by mouth 2 (two) times daily.   levothyroxine 112 MCG tablet Commonly known  as:  SYNTHROID Take 112 mcg by mouth daily before breakfast.   metoprolol tartrate 50 MG tablet Commonly known as: LOPRESSOR Take 50 mg by mouth 2 (two) times daily.   PRESERVISION AREDS 2+MULTI VIT PO Take by mouth 2 (two) times daily. 737-10-62-6 mg capsule   saccharomyces boulardii 250 MG capsule Commonly known as: FLORASTOR Take 250 mg by mouth daily.   senna 8.6 MG tablet Commonly known as: SENOKOT Take 2 tablets by mouth at bedtime.   sulfamethoxazole-trimethoprim 400-80 MG tablet Commonly known as: BACTRIM Take 1 tablet by mouth 2 (two) times daily.   temazepam 30 MG capsule Commonly known as: RESTORIL Take 1 capsule (30 mg total) by mouth at bedtime as needed for sleep.       Review of Systems  Constitutional: Positive for activity change.  HENT: Negative.   Respiratory: Negative.   Cardiovascular: Positive for leg swelling.  Gastrointestinal: Positive for constipation.  Genitourinary: Negative.   Musculoskeletal: Positive for arthralgias, gait problem and myalgias.  Neurological: Positive for weakness.  Psychiatric/Behavioral: Positive for sleep disturbance.  All other systems reviewed and are negative.   Vitals:   10/13/20 0929  BP: 120/70  Pulse: 65  Resp: 20  Temp: 97.6 F (36.4 C)  SpO2: 91%  Weight: 132 lb (59.9 kg)  Height: 5\' 3"  (1.6 m)   Body mass index is 23.38 kg/m. Physical Exam Vitals reviewed.  Constitutional:      Appearance: Normal appearance.  HENT:     Head: Normocephalic.     Nose: Nose normal.     Mouth/Throat:     Mouth: Mucous membranes are moist.     Pharynx: Oropharynx is clear.  Eyes:     Pupils: Pupils are equal, round, and reactive to light.  Cardiovascular:     Rate and Rhythm: Normal rate. Rhythm irregular.     Pulses: Normal pulses.  Pulmonary:     Effort: Pulmonary effort is normal. No respiratory distress.     Breath sounds: Normal breath sounds. No wheezing.  Abdominal:     General: Abdomen is flat. Bowel  sounds are normal.     Palpations: Abdomen is soft.  Musculoskeletal:        General: Swelling present.     Cervical back: Neck supple.  Skin:    General: Skin is warm.  Neurological:     General: No focal deficit present.     Mental Status: She is alert and oriented to person, place, and time.  Psychiatric:        Mood and Affect: Mood normal.        Thought Content: Thought content normal.     Labs reviewed: Basic Metabolic Panel: Recent Labs    10/04/20 0241 10/05/20 0117 10/06/20 0201  NA 129* 126* 130*  K 3.8 3.5 3.3*  CL 93* 91* 92*  CO2 29 27 29   GLUCOSE 116* 112* 105*  BUN 11 10 15   CREATININE 0.81 0.96 0.88  CALCIUM 9.0 9.0 9.4  MG 1.6* 1.7 1.6*  PHOS 1.4* 2.1* 3.3   Liver Function Tests: Recent Labs    10/04/20 0241 10/05/20 0117 10/06/20 0201  AST 14* 16 18  ALT 12 14 15   ALKPHOS 73 69 78  BILITOT 0.7 0.9 0.9  PROT 5.8* 5.7* 5.8*  ALBUMIN 3.0* 2.8* 3.0*   No results for input(s): LIPASE, AMYLASE in the last 8760 hours. No results for input(s): AMMONIA in the last 8760 hours. CBC: Recent Labs    10/23/19 1043 10/23/19  1043 10/01/20 2104 10/01/20 2117 10/04/20 0241 10/05/20 0117 10/06/20 0201  WBC 6.7   < > 10.4   < > 8.4 9.9 8.5  NEUTROABS 3,544  --  7.3  --   --   --  4.7  HGB 13.2   < > 15.6*   < > 13.1 13.1 13.7  HCT 39.2   < > 46.6*   < > 39.1 38.5 40.3  MCV 88.1   < > 93.2   < > 91.6 92.8 90.6  PLT 301   < > 251   < > 220 221 251   < > = values in this interval not displayed.   Cardiac Enzymes: No results for input(s): CKTOTAL, CKMB, CKMBINDEX, TROPONINI in the last 8760 hours. BNP: Invalid input(s): POCBNP Lab Results  Component Value Date   HGBA1C 5.9 08/10/2015   Lab Results  Component Value Date   TSH 1.197 10/02/2020   Lab Results  Component Value Date   VITAMINB12 512 08/03/2015   Lab Results  Component Value Date   FOLATE 20.5 08/03/2015   Lab Results  Component Value Date   IRON 16 (L) 08/03/2015   TIBC  238 (L) 08/03/2015   FERRITIN 151 08/03/2015    Imaging and Procedures obtained prior to SNF admission: CT Hip Left Wo Contrast  Result Date: 10/01/2020 CLINICAL DATA:  Left hip pain.  Possible stress fracture. EXAM: CT OF THE LEFT HIP WITHOUT CONTRAST TECHNIQUE: Multidetector CT imaging of the left hip was performed according to the standard protocol. Multiplanar CT image reconstructions were also generated. COMPARISON:  Radiograph of same day.  CT of June 06, 2017. FINDINGS: Status post left total hip arthroplasty for treatment of old proximal left femoral trochanteric fracture. This exam is significantly limited due to scatter artifact arising from the hip prosthesis. There appears to be a new longitudinal nondisplaced fracture in the medial periprosthetic region of the proximal left femur which most likely is acute or subacute. IMPRESSION: Status post left total hip arthroplasty for treatment of old proximal left femoral trochanteric fracture. There appears to be a new longitudinal nondisplaced fracture in the medial periprosthetic region of the proximal left femur which most likely is acute or subacute. Electronically Signed   By: Marijo Conception M.D.   On: 10/01/2020 19:17   DG Hip Unilat W or Wo Pelvis 2-3 Views Left  Result Date: 10/01/2020 CLINICAL DATA:  Pain EXAM: DG HIP (WITH OR WITHOUT PELVIS) 2-3V LEFT COMPARISON:  July 21, 2017 FINDINGS: Frontal pelvis as well as frontal and lateral left hip images were obtained. Patient is status post total hip replacements bilaterally with prosthetic components bilaterally well seated. No acute fracture or dislocation evident. Chronic avulsion of the greater and lesser trochanters on the left with remodeling. Bony overgrowth along the greater and lesser trochanters on the right, stable. No erosive change. Bones osteoporotic. Multiple foci of arterial atherosclerotic calcification noted. IMPRESSION: Total hip replacements with prosthetic components  bilaterally well-seated. Bony remodeling in both proximal femur regions, stable. Underlying osteoporosis. No acute fracture or dislocation. Multiple foci of arterial vascular calcification noted. Electronically Signed   By: Lowella Grip III M.D.   On: 10/01/2020 16:15    Assessment/Plan Periprosthetic fracture around internal prosthetic left hip joint, subsequent encounter NWB for 6 weeks Working with therapy Pain Control on Tylenol Follow with ortho Essential hypertension On Diltiazem,Chlorthalidone and Lopressor  Chronic antibiotic suppression For Porsthetic infection On Bactrum BID Paroxysmal atrial fibrillation (HCC) Eliquis and Rate Control  with Diltiazem and Lopressor Hypothyroidism, unspecified type TSH normal in hospital Hyponatremia Repeat BMP Was treated with Fluid restriction and Lasix Hypomagnesemia Repeating levels Hypokalemia Repeating Level pending Osteoporosis, unspecified osteoporosis type, unspecified pathological fracture presence On Prolia Depression, recurrent ([place]) On Cymbalta Insomnia Continue Restoril   Family/ staff Communication:   Labs/tests ordered:

## 2020-10-15 LAB — BASIC METABOLIC PANEL
BUN: 13 (ref 4–21)
CO2: 33 — AB (ref 13–22)
Chloride: 95 — AB (ref 99–108)
Creatinine: 0.8 (ref 0.5–1.1)
Glucose: 110
Potassium: 3.9 (ref 3.4–5.3)
Sodium: 137 (ref 137–147)

## 2020-10-15 LAB — CBC AND DIFFERENTIAL
HCT: 40 (ref 36–46)
Hemoglobin: 13.6 (ref 12.0–16.0)
Neutrophils Absolute: 4629
Platelets: 383 (ref 150–399)
WBC: 7.9

## 2020-10-15 LAB — COMPREHENSIVE METABOLIC PANEL
Albumin: 3.8 (ref 3.5–5.0)
Calcium: 10.2 (ref 8.7–10.7)
Globulin: 2.1

## 2020-10-15 LAB — CBC: RBC: 4.38 (ref 3.87–5.11)

## 2020-10-15 LAB — HEPATIC FUNCTION PANEL
ALT: 11 (ref 7–35)
AST: 11 — AB (ref 13–35)
Alkaline Phosphatase: 100 (ref 25–125)
Bilirubin, Total: 0.6

## 2020-10-19 ENCOUNTER — Other Ambulatory Visit: Payer: Self-pay | Admitting: *Deleted

## 2020-10-19 NOTE — Telephone Encounter (Signed)
Received refill Request from [facility] Pended Rx and sent to Dr. Gupta for approval.  

## 2020-10-19 NOTE — Telephone Encounter (Signed)
Received refill Request from [facility] Pended Rx and sent to Dr. Gupta for approval.  

## 2020-10-20 MED ORDER — TEMAZEPAM 30 MG PO CAPS: 30.0000 mg | ORAL_CAPSULE | Freq: Every evening | ORAL | 0 refills | Status: DC | PRN

## 2020-10-22 ENCOUNTER — Ambulatory Visit: Payer: Medicare Other | Admitting: Infectious Disease

## 2020-10-22 ENCOUNTER — Ambulatory Visit (INDEPENDENT_AMBULATORY_CARE_PROVIDER_SITE_OTHER): Payer: Medicare Other

## 2020-10-22 ENCOUNTER — Ambulatory Visit (INDEPENDENT_AMBULATORY_CARE_PROVIDER_SITE_OTHER): Payer: Medicare Other | Admitting: Orthopaedic Surgery

## 2020-10-22 ENCOUNTER — Ambulatory Visit: Payer: Self-pay

## 2020-10-22 ENCOUNTER — Encounter: Payer: Self-pay | Admitting: Orthopaedic Surgery

## 2020-10-22 DIAGNOSIS — M25552 Pain in left hip: Secondary | ICD-10-CM | POA: Diagnosis not present

## 2020-10-22 NOTE — Progress Notes (Signed)
Patient: Leah Holland           Date of Birth: 05-Apr-1922           MRN: [id number] Visit Date: 10/22/2020 PCP: [name], MD   Assessment & Plan:  Chief Complaint:  Chief Complaint  Patient presents with  . Left Hip - Pain   Visit Diagnoses:  1. Pain in left hip     Plan: [facility] is ER follow-up for nondisplaced periprosthetic femur fracture.  She is in the skilled portion of her nursing home.  Overall doing well has no pain.  She has been able to do nonweightbearing physical therapy strengthening exercises.  Her exam of the leg is asymptomatic.  Full painless range of motion of the hip.  X-rays demonstrate stable fracture without any evidence of interval worsening or subsidence of the stem.  At this point we will advance her to 25% partial weightbearing of the left lower extremity with a walker.  She will work with PT.  Recheck in 3 weeks with two-view x-rays of the left hip.  Follow-Up Instructions: Return in about 3 weeks (around 11/12/2020).   Orders:  Orders Placed This Encounter  Procedures  . XR FEMUR MIN 2 VIEWS LEFT   No orders of the defined types were placed in this encounter.   Imaging: XR FEMUR MIN 2 VIEWS LEFT  Result Date: 10/22/2020 Stable partial hip replacement without any subsidence.  No evidence of worsening periprosthetic femur fracture.   PMFS History: Patient Active Problem List   Diagnosis Date Noted  . Hypophosphatemia 10/07/2020  . Hypomagnesemia 10/07/2020  . Hypokalemia 10/07/2020  . Periprosthetic fracture around internal prosthetic left hip joint ([place]) 10/03/2020  . Leg fracture, left, closed, initial encounter 10/01/2020  . Hypertensive urgency 10/22/2018  . Chronic low back pain 12/21/2017  . Chronic antibiotic suppression 06/13/2017  . History of hip replacement 06/09/2017  . Ecchymosis 02/23/2017  . Closed intertrochanteric fracture with nonunion, left 01/26/2017  . Leukocytosis 01/26/2017  . Fall 01/26/2017  .  Prosthetic hip infection ([place]) 08/22/2016  . Tendinopathy 08/22/2016  . Depression, recurrent ([place]) 04/07/2016  . Arthritis of knee, degenerative 04/07/2016  . OP (osteoporosis) 04/07/2016  . 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 Chronic anticoagulation 08/06/2015  . Bipolar disorder ([place]) 08/04/2015  . Hyponatremia 08/04/2015  . Constipation 08/04/2015  . Complication of anesthesia 07/30/2015  . Diabetes mellitus type 2, diet-controlled ([place])   . Skin cancer of face   . Paroxysmal atrial fibrillation ([place]) 09/01/2014  . Right carotid bruit 11/18/2013  . Postoperative anemia due to acute blood loss 03/03/2012  . Hypertension   . Hyperlipidemia   . Swelling of extremity   . Cataract   . Hypothyroidism   . Arthritis   . Insomnia    Past Medical History:  Diagnosis Date  . Anxiety attack    "take RX prn" (08/13/2013)  . Arthritis    "probably" (08/13/2013)  . Atrial fibrillation ([place])    new onset/pt report 08/13/2013  . Bronchitis 12/2011  . Complication of anesthesia   . Depression   . Diabetes mellitus type 2, diet-controlled ([place])    denies  . Femur fracture (HCC)    left proximal femur non-union  . GERD (gastroesophageal reflux disease)   . H/O hiatal hernia   . Hardware complicating wound infection ([place]) 08/22/2016  . History of blood transfusion    "w/both knee OR's" (08/13/2013)  . Hyperlipidemia   . Hyperlipidemia   . Hypertension   .  Hypertensive urgency 10/22/2018  . Hypothyroidism    takes synthroid  . Insomnia   . Intertrochanteric fracture of right hip ([place]) 07/30/2015  . Neuromuscular disorder ([place])    carpal tunnel in right hand  . Periprosthetic fracture around internal prosthetic right hip joint ([place]), nonunion basicervical femoral neck with broken hardware and subtrochanteric fracture 03/16/2016  . PONV (postoperative nausea and vomiting)   . Prosthetic hip infection ([place]) 08/22/2016  . Proteus infection 08/22/2016  . Serratia infection 11/14/2016  .  Skin cancer of face 2000's   "one on each side of my face and one on my nose" (08/13/2013)  . Swelling of extremity    sees Dr. Gwenlyn Found, cardiologist 1 x year [phone]  . Tendon dysfunction 08/22/2016  . Wears hearing aid    HOH    Family History  Problem Relation Age of Onset  . Arrhythmia Mother   . Heart disease Mother   . Arrhythmia Sister   . Stroke Father 45  . Heart disease Brother   . Heart disease Brother   . Cancer Brother        Colon cancer  . Arrhythmia Brother   . Heart disease Brother   . Heart disease Sister   . Heart disease Sister   . Stroke Sister   . Diabetes Sister   . Heart disease Child 25  . Heart disease Child 66    Past Surgical History:  Procedure Laterality Date  . 2D ECHOCARDIOGRAM  12/07/2004   EF 48%, moderate pulmonary hypertension  . BREAST BIOPSY Left ?1970's  . CARDIOVASCULAR STRESS TEST  12/07/11  . CARDIOVASCULAR STRESS TEST  12/07/2011   LEXISCAN, normal scan, no significant wall abnormalities noted  . CARPAL TUNNEL RELEASE Right ~ 2005  . CATARACT EXTRACTION, BILATERAL Bilateral 2000  . DILATION AND CURETTAGE OF UTERUS  1950's  . DOPPLER ECHOCARDIOGRAPHY  12/07/11  . EYE SURGERY    . FEMUR IM NAIL Right 08/01/2015   Procedure: INTRAMEDULLARY (IM) NAIL FEMORAL;  Surgeon: Marchia Bond, MD;  Location: [facility];  Service: Orthopedics;  Laterality: Right;  . HARDWARE REMOVAL Right 03/16/2016   Procedure: HARDWARE REMOVAL;  Surgeon: Marchia Bond, MD;  Location: [facility];  Service: Orthopedics;  Laterality: Right;  . INTRAMEDULLARY (IM) NAIL INTERTROCHANTERIC Left 01/26/2017   Procedure: INTRAMEDULLARY (IM) NAIL INTERTROCHANTRIC;  Surgeon: Leandrew Koyanagi, MD;  Location: [employer] [facility];  Service: Orthopedics;  Laterality: Left;  . JOINT REPLACEMENT     bilateral knee replacement  . SKIN CANCER EXCISION  2000's   "off my nose" (08/13/2013)  . TOTAL HIP ARTHROPLASTY Right 03/16/2016   Procedure: TOTAL HIP ARTHROPLASTY;  Surgeon: Marchia Bond, MD;  Location: [facility];  Service: Orthopedics;  Laterality: Right;  . TOTAL HIP ARTHROPLASTY Left 06/09/2017   Procedure: REMOVE LEFT HIP INTRAMEDULLARY NAIL AND CONVERT TO LEFT CEMENTED HEMI HIP ARTHROPLASTY;  Surgeon: Leandrew Koyanagi, MD;  Location: [employer];  Service: Orthopedics;  Laterality: Left;  . TOTAL KNEE ARTHROPLASTY Right 2005  . TOTAL KNEE ARTHROPLASTY  03/02/2012   Procedure: TOTAL KNEE ARTHROPLASTY;  Surgeon: Yvette Rack., MD;  Location: [facility];  Service: Orthopedics;  Laterality: Left;   Social History   Occupational History  . Not on file  Tobacco Use  . Smoking status: Never Smoker  . Smokeless tobacco: Never Used  Substance and Sexual Activity  . Alcohol use: No  . Drug use: No  . Sexual activity: Never

## 2020-10-23 ENCOUNTER — Encounter: Payer: Self-pay | Admitting: Nurse Practitioner

## 2020-10-23 ENCOUNTER — Non-Acute Institutional Stay (SKILLED_NURSING_FACILITY): Payer: Medicare Other | Admitting: Nurse Practitioner

## 2020-10-23 DIAGNOSIS — K219 Gastro-esophageal reflux disease without esophagitis: Secondary | ICD-10-CM

## 2020-10-23 DIAGNOSIS — E039 Hypothyroidism, unspecified: Secondary | ICD-10-CM | POA: Diagnosis not present

## 2020-10-23 DIAGNOSIS — G4700 Insomnia, unspecified: Secondary | ICD-10-CM

## 2020-10-23 DIAGNOSIS — M810 Age-related osteoporosis without current pathological fracture: Secondary | ICD-10-CM | POA: Diagnosis not present

## 2020-10-23 DIAGNOSIS — I480 Paroxysmal atrial fibrillation: Secondary | ICD-10-CM

## 2020-10-23 DIAGNOSIS — F339 Major depressive disorder, recurrent, unspecified: Secondary | ICD-10-CM

## 2020-10-23 DIAGNOSIS — M9702XD Periprosthetic fracture around internal prosthetic left hip joint, subsequent encounter: Secondary | ICD-10-CM | POA: Diagnosis not present

## 2020-10-23 DIAGNOSIS — E871 Hypo-osmolality and hyponatremia: Secondary | ICD-10-CM | POA: Diagnosis not present

## 2020-10-23 DIAGNOSIS — M7989 Other specified soft tissue disorders: Secondary | ICD-10-CM

## 2020-10-23 DIAGNOSIS — K5900 Constipation, unspecified: Secondary | ICD-10-CM

## 2020-10-23 DIAGNOSIS — E876 Hypokalemia: Secondary | ICD-10-CM

## 2020-10-23 NOTE — Assessment & Plan Note (Signed)
C/o indigestion, will resume Nexium 40mg  qd.

## 2020-10-23 NOTE — Assessment & Plan Note (Signed)
Depression, takes Duloxetine 60mg  qd.

## 2020-10-23 NOTE — Progress Notes (Signed)
Location:    [facility] Room Number: 34 Place of Service:  SNF (31) Provider: Lennie Odor Kei Mcelhiney NP  [name], MD  Patient Care Team: [name], MD as PCP - General (Family Medicine) Lorretta Harp, MD as PCP - Cardiology (Cardiology) Earlie Server, MD as Consulting Physician (Orthopedic Surgery) Lorretta Harp, MD as Consulting Physician (Cardiology) Calvert Cantor, MD as Consulting Physician (Ophthalmology) [name], MD as Attending Physician [facility] Medicine)  Extended Emergency Contact Information Primary Emergency Contact: Ponciano Ort Address: [address]          [place], [place] [address] Johnnette Litter of [place] Phone: [phone] Relation: Son Secondary Emergency Contact: [place], [place] [address] Johnnette Litter of [place] Phone: [phone] Relation: Son  Code Status: DNR Goals of care: Advanced Directive information Advanced Directives 10/13/2020  Does Patient Have a Medical Advance Directive? Yes  Type of Advance Directive Living will  Does patient want to make changes to medical advance directive? No - Patient declined  Copy of [place] in Chart? -  Would patient like information on creating a medical advance directive? -  Pre-existing out of facility DNR order (yellow form or pink MOST form) -     Chief Complaint  Patient presents with   Acute Visit    Medication review     HPI:  Pt is a 86 y.o. female seen today for an acute visit for the patient's request for medication review.     Hospitalization 10/01/20-10/06/20 for left hip pain, negative Xray, CT showed non displaced fx of the medial periprosthetic region along the left femur, Ortho recommended non weight bearing x 6 weeks, no surgical intervention, f/u Ortho 10/22/20  Afib, takes Eliquis [place] vas 2 score >3, takes Diltiazem, Metoprolol             Hypothyroidism, takes Levothyroxine, TSH  1.197 10/02/20             Hyponatremia, SIADH, fluid restriction, Na 130 10/06/20, treated with Furosemide in the past             Hypophosphatemia, replace, f/u P level one week. P 3.3 10/06/20             Hypomagnesemia, replaced, f/u Mg level one week, Mg 1.6 10/06/20             Hypokalemia, takes Kcl prn, K 3.3 10/06/20             Insomnia, takes Temazepam 30mg  prn             Edema BLE, prn Furosemide. Chlorthalidone 25mg  qd. BNP 139 10/06/20. No echocardiogram located.              Depression, takes Duloxetine 60mg  qd.              OP, takes Prolia, Vit D, cannot locate DEXA                Past Medical History:  Diagnosis Date   Anxiety attack    "take RX prn" (08/13/2013)   Arthritis    "probably" (08/13/2013)   Atrial fibrillation ([place])    new onset/pt report 08/13/2013   Bronchitis 03/3153   Complication of anesthesia    Depression    Diabetes mellitus type 2, diet-controlled ([place])    denies   Femur fracture ([employer])    left proximal femur non-union   GERD (  gastroesophageal reflux disease)    H/O hiatal hernia    Hardware complicating wound infection ([place]) 08/22/2016   History of blood transfusion    "w/both knee OR's" (08/13/2013)   Hyperlipidemia    Hyperlipidemia    Hypertension    Hypertensive urgency 10/22/2018   Hypothyroidism    takes synthroid   Insomnia    Intertrochanteric fracture of right hip ([place]) 07/30/2015   Neuromuscular disorder (HCC)    carpal tunnel in right hand   Periprosthetic fracture around internal prosthetic right hip joint ([place]), nonunion basicervical femoral neck with broken hardware and subtrochanteric fracture 03/16/2016   PONV (postoperative nausea and vomiting)    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 Gwenlyn Found, cardiologist 1 x  year [phone]   Tendon dysfunction 08/22/2016   Wears hearing aid    Speciality [facility]   Past Surgical History:  Procedure Laterality Date   2D ECHOCARDIOGRAM  12/07/2004   EF 48%, moderate pulmonary hypertension   BREAST BIOPSY Left ?1970's   CARDIOVASCULAR STRESS TEST  12/07/11   CARDIOVASCULAR STRESS TEST  12/07/2011   LEXISCAN, normal scan, no significant wall abnormalities noted   CARPAL TUNNEL RELEASE Right ~ 2005   CATARACT EXTRACTION, BILATERAL Bilateral 2000   DILATION AND CURETTAGE OF UTERUS  1950's   DOPPLER ECHOCARDIOGRAPHY  12/07/11   EYE SURGERY     FEMUR IM NAIL Right 08/01/2015   Procedure: INTRAMEDULLARY (IM) NAIL FEMORAL;  Surgeon: Marchia Bond, MD;  Location: [facility];  Service: Orthopedics;  Laterality: Right;   HARDWARE REMOVAL Right 03/16/2016   Procedure: HARDWARE REMOVAL;  Surgeon: Marchia Bond, MD;  Location: [facility];  Service: Orthopedics;  Laterality: Right;   INTRAMEDULLARY (IM) NAIL INTERTROCHANTERIC Left 01/26/2017   Procedure: INTRAMEDULLARY (IM) NAIL INTERTROCHANTRIC;  Surgeon: Leandrew Koyanagi, MD;  Location: [facility];  Service: Orthopedics;  Laterality: Left;   JOINT REPLACEMENT     bilateral knee replacement   SKIN CANCER EXCISION  2000's   "off my nose" (08/13/2013)   TOTAL HIP ARTHROPLASTY Right 03/16/2016   Procedure: TOTAL HIP ARTHROPLASTY;  Surgeon: Marchia Bond, MD;  Location: [facility];  Service: Orthopedics;  Laterality: Right;   TOTAL HIP ARTHROPLASTY Left 06/09/2017   Procedure: REMOVE LEFT HIP INTRAMEDULLARY NAIL AND CONVERT TO LEFT CEMENTED HEMI HIP ARTHROPLASTY;  Surgeon: Leandrew Koyanagi, MD;  Location: [facility];  Service: Orthopedics;  Laterality: Left;   TOTAL KNEE ARTHROPLASTY Right 2005   TOTAL KNEE ARTHROPLASTY  03/02/2012   Procedure: TOTAL KNEE ARTHROPLASTY;  Surgeon: Yvette Rack., MD;  Location: [facility];  Service: Orthopedics;  Laterality: Left;    Allergies  Allergen Reactions   Levaquin [Levofloxacin] Other (See Comments)     tendinopathy   Morphine And Related Other (See Comments)    Made pt "wild"    Allergies as of 10/23/2020      Reactions   Levaquin [levofloxacin] Other (See Comments)   tendinopathy   Morphine And Related Other (See Comments)   Made pt "wild"      Medication List       Accurate as of October 23, 2020 11:59 PM. If you have any questions, ask your nurse or doctor.        acetaminophen 325 MG tablet Commonly known as: TYLENOL Take  650 mg by mouth every 6 (six) hours as needed for moderate pain.   alum & mag hydroxide-simeth 200-200-20 MG/5ML suspension Commonly known as: MAALOX/MYLANTA Take 30 mLs by mouth every 4 (four) hours as needed for indigestion or heartburn.   chlorthalidone 25 MG tablet Commonly known as: HYGROTON TAKE 1 TABLET ONCE DAILY.   Cholecalciferol 50 MCG (2000 UT) Caps Take 2,000 Units by mouth every evening.   denosumab 60 MG/ML Soln injection Commonly known as: PROLIA Inject 60 mg into the skin every 6 (six) months. Administer in upper arm, thigh, or abdomen   diltiazem 180 MG 24 hr capsule Commonly known as: CARDIZEM CD TAKE 1 CAPSULE DAILY.   DULoxetine 60 MG capsule Commonly known as: CYMBALTA Take 60 mg by mouth daily.   Eliquis 2.5 MG Tabs tablet Generic drug: apixaban TAKE 1 TABLET BY MOUTH TWICE DAILY. What changed: how much to take   Flaxseed Oil 1000 MG Caps Take 1,000 mg by mouth 2 (two) times daily.   levothyroxine 112 MCG tablet Commonly known as: SYNTHROID Take 112 mcg by mouth daily before breakfast.   metoprolol tartrate 50 MG tablet Commonly known as: LOPRESSOR Take 50 mg by mouth 2 (two) times daily.   potassium chloride SA 20 MEQ tablet Commonly known as: KLOR-CON Take 20 mEq by mouth daily as needed.   PRESERVISION AREDS 2+MULTI VIT PO Take by mouth 2 (two) times daily. 267-12-45-8 mg capsule   saccharomyces boulardii 250 MG capsule Commonly known as: FLORASTOR Take 250 mg by mouth daily.   senna 8.6 MG  tablet Commonly known as: SENOKOT Take 2 tablets by mouth at bedtime.   sulfamethoxazole-trimethoprim 400-80 MG tablet Commonly known as: BACTRIM Take 1 tablet by mouth 2 (two) times daily.   temazepam 30 MG capsule Commonly known as: RESTORIL Take 1 capsule (30 mg total) by mouth at bedtime as needed for sleep.       Review of Systems  Constitutional: Positive for unexpected weight change. Negative for activity change, appetite change and fever.       ?#12Ibs weight gained in the past 10 days.   HENT: Positive for hearing loss. Negative for congestion, mouth sores, rhinorrhea, sore throat and trouble swallowing.   Eyes:       Wears glasses   Respiratory: Negative for cough, chest tightness, shortness of breath and wheezing.   Cardiovascular: Positive for leg swelling. Negative for chest pain and palpitations.  Gastrointestinal: Positive for constipation. Negative for abdominal pain, nausea and vomiting.       Has GERD, c/o indigestion.   Genitourinary: Negative for dysuria and frequency.  Musculoskeletal: Positive for arthralgias and gait problem. Negative for back pain.       Has history of fracture and replacement of right hip and left hip. Pain medication is helping with her left hip pain.   Skin: Negative for color change.  Neurological: Negative for tremors, seizures, syncope, light-headedness and headaches.  Hematological: Does not bruise/bleed easily.  Psychiatric/Behavioral: Positive for sleep disturbance. Negative for behavioral problems and confusion. The patient is nervous/anxious.     Immunization History  Administered Date(s) Administered   Influenza Whole 03/05/2014   Influenza,inj,Quad PF,6+ Mos 10/12/2012, 08/22/2016, 10/16/2017   Influenza-Unspecified 08/27/2013, 10/01/2020   PPD Test 10/16/2012   Pneumococcal Polysaccharide-23 11/07/2011   Unspecified SARS-COV-2 Vaccination 12/16/2019, 01/13/2020   Zoster Recombinat (Shingrix) 11/28/2017, 02/26/2018    Pertinent  Health Maintenance Due  Topic Date Due   FOOT EXAM  Never done   OPHTHALMOLOGY EXAM  Never done   URINE MICROALBUMIN  Never done   DEXA SCAN  Never done   PNA vac Low Risk Adult (2 of 2 - PCV13) 11/06/2012   HEMOGLOBIN A1C  02/09/2016   INFLUENZA VACCINE  Completed   Fall Risk  10/23/2019 10/16/2017 03/27/2017 11/14/2016 08/22/2016  Falls in the past year? 0 Yes No No No  Comment - - - - 07/2015  Number falls in past yr: 0 1 - - -  Injury with Fall? 0 Yes - - -  Risk for fall due to : - History of fall(s) - Impaired mobility Impaired mobility  Follow up - - - - -   Functional Status Survey:    Vitals:   10/23/20 1426  BP: (!) 132/58  Pulse: 72  Resp: 16  Temp: 98 F (36.7 C)  SpO2: 95%  Weight: 134 lb 14.4 oz (61.2 kg)  Height: 5\' 3"  (1.6 m)   Body mass index is 23.9 kg/m. Physical Exam Constitutional:      Appearance: Normal appearance. She is well-developed. She is not diaphoretic.  HENT:     Head: Normocephalic and atraumatic.     Nose: Nose normal.     Mouth/Throat:     Mouth: Mucous membranes are moist.  Eyes:     Conjunctiva/sclera: Conjunctivae normal.     Pupils: Pupils are equal, round, and reactive to light.  Neck:     Thyroid: No thyromegaly.  Cardiovascular:     Rate and Rhythm: Normal rate and regular rhythm.     Heart sounds: No murmur heard.   Pulmonary:     Effort: Pulmonary effort is normal.     Breath sounds: Normal breath sounds.  Abdominal:     General: Bowel sounds are normal.     Palpations: Abdomen is soft.     Tenderness: There is no abdominal tenderness.  Musculoskeletal:     Cervical back: Normal range of motion and neck supple.     Right lower leg: Edema present.     Left lower leg: Edema present.     Comments: Can move all 4 extremities, ROM with left hip limited, able to bear weight to both LE, contact guard assist with transfers, using a walker with gait belt with therapy, 2+ leg edema.   Lymphadenopathy:      Cervical: No cervical adenopathy.  Skin:    General: Skin is warm and dry.  Neurological:     Mental Status: She is alert and oriented to person, place, and time. Mental status is at baseline.     Motor: No weakness.     Coordination: Coordination normal.     Gait: Gait abnormal.  Psychiatric:        Mood and Affect: Mood normal.        Behavior: Behavior normal.     Labs reviewed: Recent Labs    10/04/20 0241 10/04/20 0241 10/05/20 0117 10/06/20 0201 10/15/20 0000  NA 129*   < > 126* 130* 137  K 3.8   < > 3.5 3.3* 3.9  CL 93*   < > 91* 92* 95*  CO2 29   < > 27 29 33*  GLUCOSE 116*  --  112* 105*  --   BUN 11   < > 10 15 13   CREATININE 0.81   < > 0.96 0.88 0.8  CALCIUM 9.0   < > 9.0 9.4 10.2  MG 1.6*  --  1.7 1.6*  --   PHOS 1.4*  --  2.1* 3.3  --    < > = values in this interval not displayed.   Recent Labs    10/04/20 0241 10/04/20 0241 10/05/20 0117 10/06/20 0201 10/15/20 0000  AST 14*   < > 16 18 11*  ALT 12   < > 14 15 11   ALKPHOS 73   < > 69 78 100  BILITOT 0.7  --  0.9 0.9  --   PROT 5.8*  --  5.7* 5.8*  --   ALBUMIN 3.0*   < > 2.8* 3.0* 3.8   < > = values in this interval not displayed.   Recent Labs    10/01/20 2104 10/01/20 2117 10/04/20 0241 10/04/20 0241 10/05/20 0117 10/06/20 0201 10/15/20 0000  WBC 10.4   < > 8.4   < > 9.9 8.5 7.9  NEUTROABS 7.3  --   --   --   --  4.7 4,629.00  HGB 15.6*   < > 13.1   < > 13.1 13.7 13.6  HCT 46.6*   < > 39.1   < > 38.5 40.3 40  MCV 93.2   < > 91.6  --  92.8 90.6  --   PLT 251   < > 220   < > 221 251 383   < > = values in this interval not displayed.   Lab Results  Component Value Date   TSH 1.197 10/02/2020   Lab Results  Component Value Date   HGBA1C 5.9 08/10/2015   Lab Results  Component Value Date   CHOL 116 04/26/2013   HDL 45 04/26/2013   LDLCALC 60 04/26/2013   TRIG 56 04/26/2013   CHOLHDL 2.6 04/26/2013    Significant Diagnostic Results in last 30 days:  CT Hip Left Wo  Contrast  Result Date: 10/01/2020 CLINICAL DATA:  Left hip pain.  Possible stress fracture. EXAM: CT OF THE LEFT HIP WITHOUT CONTRAST TECHNIQUE: Multidetector CT imaging of the left hip was performed according to the standard protocol. Multiplanar CT image reconstructions were also generated. COMPARISON:  Radiograph of same day.  CT of June 06, 2017. FINDINGS: Status post left total hip arthroplasty for treatment of old proximal left femoral trochanteric fracture. This exam is significantly limited due to scatter artifact arising from the hip prosthesis. There appears to be a new longitudinal nondisplaced fracture in the medial periprosthetic region of the proximal left femur which most likely is acute or subacute. IMPRESSION: Status post left total hip arthroplasty for treatment of old proximal left femoral trochanteric fracture. There appears to be a new longitudinal nondisplaced fracture in the medial periprosthetic region of the proximal left femur which most likely is acute or subacute. Electronically Signed   By: Marijo Conception M.D.   On: 10/01/2020 19:17   DG Hip Unilat W or Wo Pelvis 2-3 Views Left  Result Date: 10/01/2020 CLINICAL DATA:  Pain EXAM: DG HIP (WITH OR WITHOUT PELVIS) 2-3V LEFT COMPARISON:  July 21, 2017 FINDINGS: Frontal pelvis as well as frontal and lateral left hip images were obtained. Patient is status post total hip replacements bilaterally with prosthetic components bilaterally well seated. No acute fracture or dislocation evident. Chronic avulsion of the greater and lesser trochanters on the left with remodeling. Bony overgrowth along the greater and lesser trochanters on the right, stable. No erosive change. Bones osteoporotic. Multiple foci of arterial atherosclerotic calcification noted. IMPRESSION: Total hip replacements with prosthetic components bilaterally well-seated. Bony remodeling in both proximal femur regions, stable. Underlying osteoporosis.  No acute fracture or  dislocation. Multiple foci of arterial vascular calcification noted. Electronically Signed   By: Lowella Grip III M.D.   On: 10/01/2020 16:15   XR FEMUR MIN 2 VIEWS LEFT  Result Date: 10/22/2020 Stable partial hip replacement without any subsidence.  No evidence of worsening periprosthetic femur fracture.   Assessment/Plan: OP (osteoporosis) OP, takes Prolia, Vit D, cannot locate DEXA   Depression, recurrent ([place]) Depression, takes Duloxetine 60mg  qd.   Swelling of extremity Edema BLE, worsened, prn Furosemide. Chlorthalidone 25mg  qd. BNP 139 10/06/20. No echocardiogram located. Will schedule Furosemide 20mg /Kcl 23meq qd x 1 week, f/u BMP  Insomnia Insomnia, takes Temazepam 30mg  prn  Hypokalemia Hypokalemia, takes Kcl prn, K 3.3 10/06/20  Hypomagnesemia Hypomagnesemia, replaced, f/u Mg level one week, Mg 1.6 10/06/20   Hypophosphatemia Hypophosphatemia, replace, f/u P level one week. P 3.3 10/06/20   Hyponatremia Hyponatremia, SIADH, fluid restriction, Na 130 10/06/20, treated with Furosemide in the past   Hypothyroidism Hypothyroidism, takes Levothyroxine, TSH 1.197 10/02/20   Paroxysmal atrial fibrillation (HCC) Afib, takes Eliquis [place] vas 2 score >3, takes Diltiazem, Metoprolol   Periprosthetic fracture around internal prosthetic left hip joint ([place]) Hospitalization 10/01/20-10/06/20 for left hip pain, negative Xray, CT showed non displaced fx of the medial periprosthetic region along the left femur, Ortho recommended non weight bearing x 6 weeks, no surgical intervention, f/u Ortho 10/22/20   Constipation Will try Senokot S II qhs.  GERD (gastroesophageal reflux disease) C/o indigestion, will resume Nexium 40mg  qd.     Family/ staff Communication: plan of care reviewed with the patient and charge nurse.   Labs/tests ordered: BMP  Time spend 35 minutes.

## 2020-10-23 NOTE — Assessment & Plan Note (Signed)
Hyponatremia, SIADH, fluid restriction, Na 130 10/06/20, treated with Furosemide in the past

## 2020-10-23 NOTE — Assessment & Plan Note (Signed)
Hypomagnesemia, replaced, f/u Mg level one week, Mg 1.6 10/06/20

## 2020-10-23 NOTE — Assessment & Plan Note (Signed)
Will try Senokot S II qhs.

## 2020-10-23 NOTE — Assessment & Plan Note (Signed)
Hypothyroidism, takes Levothyroxine, TSH 1.197 10/02/20

## 2020-10-23 NOTE — Assessment & Plan Note (Signed)
Afib, takes Eliquis [place] vas 2 score >3, takes Diltiazem, Metoprolol

## 2020-10-23 NOTE — Assessment & Plan Note (Signed)
Hypokalemia, takes Kcl prn, K 3.3 10/06/20

## 2020-10-23 NOTE — Assessment & Plan Note (Signed)
Hospitalization 10/01/20-10/06/20 for left hip pain, negative Xray, CT showed non displaced fx of the medial periprosthetic region along the left femur, Ortho recommended non weight bearing x 6 weeks, no surgical intervention, f/u Ortho 10/22/20

## 2020-10-23 NOTE — Assessment & Plan Note (Addendum)
Edema BLE, worsened, prn Furosemide. Chlorthalidone 25mg  qd. BNP 139 10/06/20. No echocardiogram located. Will schedule Furosemide 20mg /Kcl 110meq qd x 1 week, f/u BMP

## 2020-10-23 NOTE — Assessment & Plan Note (Signed)
Insomnia, takes Temazepam 30mg  prn

## 2020-10-23 NOTE — Assessment & Plan Note (Signed)
Hypophosphatemia, replace, f/u P level one week. P 3.3 10/06/20

## 2020-10-23 NOTE — Assessment & Plan Note (Signed)
OP, takes Prolia, Vit D, cannot locate DEXA

## 2020-10-26 ENCOUNTER — Encounter: Payer: Self-pay | Admitting: Nurse Practitioner

## 2020-10-29 DIAGNOSIS — I10 Essential (primary) hypertension: Secondary | ICD-10-CM | POA: Diagnosis not present

## 2020-10-29 LAB — COMPREHENSIVE METABOLIC PANEL: Calcium: 10.4 (ref 8.7–10.7)

## 2020-10-29 LAB — BASIC METABOLIC PANEL
BUN: 18 (ref 4–21)
CO2: 37 — AB (ref 13–22)
Chloride: 92 — AB (ref 99–108)
Creatinine: 1 (ref 0.5–1.1)
Glucose: 95
Potassium: 3.7 (ref 3.4–5.3)
Sodium: 134 — AB (ref 137–147)

## 2020-11-09 ENCOUNTER — Non-Acute Institutional Stay (SKILLED_NURSING_FACILITY): Payer: Medicare Other | Admitting: Nurse Practitioner

## 2020-11-09 DIAGNOSIS — I480 Paroxysmal atrial fibrillation: Secondary | ICD-10-CM

## 2020-11-09 DIAGNOSIS — F339 Major depressive disorder, recurrent, unspecified: Secondary | ICD-10-CM

## 2020-11-09 DIAGNOSIS — E871 Hypo-osmolality and hyponatremia: Secondary | ICD-10-CM

## 2020-11-09 DIAGNOSIS — S72142K Displaced intertrochanteric fracture of left femur, subsequent encounter for closed fracture with nonunion: Secondary | ICD-10-CM | POA: Diagnosis not present

## 2020-11-09 DIAGNOSIS — M810 Age-related osteoporosis without current pathological fracture: Secondary | ICD-10-CM | POA: Diagnosis not present

## 2020-11-09 DIAGNOSIS — M7989 Other specified soft tissue disorders: Secondary | ICD-10-CM | POA: Diagnosis not present

## 2020-11-09 DIAGNOSIS — E039 Hypothyroidism, unspecified: Secondary | ICD-10-CM

## 2020-11-09 DIAGNOSIS — G4700 Insomnia, unspecified: Secondary | ICD-10-CM

## 2020-11-09 DIAGNOSIS — E876 Hypokalemia: Secondary | ICD-10-CM

## 2020-11-09 NOTE — Assessment & Plan Note (Addendum)
The patient requested to repeat scheduled Furosemide 20mg  qd/Kcl 35meq qd x 2 weeks, then prn as prior due to persisted swelling in legs, will update BMP upon completion of 2 weeks course of Furosemide.  Last 7 day course of Furosemide was 10/23/20, effective, but not total satisfactory.  Prior: Edema BLE, prn Furosemide. Chlorthalidone 25mg  qd. BNP 139 10/06/20. No echocardiogram located.

## 2020-11-09 NOTE — Progress Notes (Signed)
Location:    Nursing Home Room Number: 92 Place of Service:  SNF (31) Provider: Lennie Odor Darnell Stimson NP  [name], MD  Patient Care Team: [name], MD as PCP - General (Family Medicine) Lorretta Harp, MD as PCP - Cardiology (Cardiology) Earlie Server, MD as Consulting Physician (Orthopedic Surgery) Lorretta Harp, MD as Consulting Physician (Cardiology) Calvert Cantor, MD as Consulting Physician (Ophthalmology) [name], MD as Attending Physician [facility])  Extended Emergency Contact Information Primary Emergency Contact: Ponciano Ort Address: [address]          [place], [place] [address] Johnnette Litter of [place] Phone: [phone] Relation: Son Secondary Emergency Contact: [place], [place] [address] Johnnette Litter of [place] Phone: [phone] Relation: Son  Code Status: DNR Goals of care: Advanced Directive information Advanced Directives 10/13/2020  Does Patient Have a Medical Advance Directive? Yes  Type of Advance Directive Living will  Does patient want to make changes to medical advance directive? No - Patient declined  Copy of [place] in Chart? -  Would patient like information on creating a medical advance directive? -  Pre-existing out of facility DNR order (yellow form or pink MOST form) -     Chief Complaint  Patient presents with  . Acute Visit    swelling in legs    HPI:  Pt is a 86 y.o. female seen today for an acute visit for persisted edema BLE. The patient requested to repeat scheduled Furosemide 20mg  qd/Kcl 76meq qd x 2 weeks, then prn as prior due to persisted swelling in legs, will update BMP upon completion of 2 weeks course of Furosemide.  Last 7 day course of Furosemide was 10/23/20, effective, but not total satisfactory.   Hospitalization 10/01/20-10/06/20 for left hip pain, negative Xray, CT showed non displaced fx of the medial periprosthetic region  along the left femur, Ortho recommended non weight bearing x 6 weeks, no surgical intervention, f/u Ortho              Afib, takes Eliquis [place] vas 2 score >3, takes Diltiazem, Metoprolol Hypothyroidism, takes Levothyroxine, TSH 1.197 10/02/20 Hyponatremia, SIADH, fluid restriction, Na 137 10/15/20, treated with Furosemide in the past Hypophosphatemia, replace, f/u P level one week. P 3.3 10/06/20 Hypomagnesemia, replaced, f/u Mg level one week, Mg 1.6 10/06/20 Hypokalemia, takes Kcl, K 3.9 10/15/20 Insomnia, takes Temazepam 30mg  prn Edema BLE, prn Furosemide. Chlorthalidone 25mg  qd. BNP 139 10/06/20. No echocardiogram located.  Depression, takes Duloxetine 60mg  qd.  OP, takes Prolia, Vit D, cannot locate DEXA    Past Medical History:  Diagnosis Date  . Anxiety attack    "take RX prn" (08/13/2013)  . Arthritis    "probably" (08/13/2013)  . Atrial fibrillation ([place])    new onset/pt report 08/13/2013  . Bronchitis 12/2011  . Complication of anesthesia   . Depression   . Diabetes mellitus type 2, diet-controlled ([place])    denies  . Femur fracture (HCC)    left proximal femur non-union  . GERD (gastroesophageal reflux disease)   . H/O hiatal hernia   . Hardware complicating wound infection ([place]) 08/22/2016  . History of blood transfusion    "w/both knee OR's" (08/13/2013)  . Hyperlipidemia   . Hyperlipidemia   . Hypertension   . Hypertensive urgency 10/22/2018  . Hypothyroidism    takes synthroid  . Insomnia   . Intertrochanteric fracture of right hip ([place]) 07/30/2015  .  Neuromuscular disorder ([place])    carpal tunnel in right hand  . Periprosthetic fracture around internal prosthetic right hip joint ([place]), nonunion basicervical femoral neck with broken hardware and subtrochanteric fracture 03/16/2016  . PONV (postoperative nausea and vomiting)   . Prosthetic hip infection  ([place]) 08/22/2016  . Proteus infection 08/22/2016  . Serratia infection 11/14/2016  . Skin cancer of face 2000's   "one on each side of my face and one on my nose" (08/13/2013)  . Swelling of extremity    sees Dr. Gwenlyn Found, cardiologist 1 x year [phone]  . Tendon dysfunction 08/22/2016  . Wears hearing aid    [facility]   Past Surgical History:  Procedure Laterality Date  . 2D ECHOCARDIOGRAM  12/07/2004   EF 48%, moderate pulmonary hypertension  . BREAST BIOPSY Left ?1970's  . CARDIOVASCULAR STRESS TEST  12/07/11  . CARDIOVASCULAR STRESS TEST  12/07/2011   LEXISCAN, normal scan, no significant wall abnormalities noted  . CARPAL TUNNEL RELEASE Right ~ 2005  . CATARACT EXTRACTION, BILATERAL Bilateral 2000  . DILATION AND CURETTAGE OF UTERUS  1950's  . DOPPLER ECHOCARDIOGRAPHY  12/07/11  . EYE SURGERY    . FEMUR IM NAIL Right 08/01/2015   Procedure: INTRAMEDULLARY (IM) NAIL FEMORAL;  Surgeon: Marchia Bond, MD;  Location: [place];  Service: Orthopedics;  Laterality: Right;  . HARDWARE REMOVAL Right 03/16/2016   Procedure: HARDWARE REMOVAL;  Surgeon: Marchia Bond, MD;  Location: [place];  Service: Orthopedics;  Laterality: Right;  . INTRAMEDULLARY (IM) NAIL INTERTROCHANTERIC Left 01/26/2017   Procedure: INTRAMEDULLARY (IM) NAIL INTERTROCHANTRIC;  Surgeon: Leandrew Koyanagi, MD;  Location: [place];  Service: Orthopedics;  Laterality: Left;  . JOINT REPLACEMENT     bilateral knee replacement  . SKIN CANCER EXCISION  2000's   "off my nose" (08/13/2013)  . TOTAL HIP ARTHROPLASTY Right 03/16/2016   Procedure: TOTAL HIP ARTHROPLASTY;  Surgeon: Marchia Bond, MD;  Location: [facility];  Service: Orthopedics;  Laterality: Right;  . TOTAL HIP ARTHROPLASTY Left 06/09/2017   Procedure: REMOVE LEFT HIP INTRAMEDULLARY NAIL AND CONVERT TO LEFT CEMENTED HEMI HIP ARTHROPLASTY;  Surgeon: Leandrew Koyanagi, MD;  Location: [place];  Service: Orthopedics;  Laterality: Left;  . TOTAL KNEE ARTHROPLASTY Right 2005  . TOTAL KNEE ARTHROPLASTY   03/02/2012   Procedure: TOTAL KNEE ARTHROPLASTY;  Surgeon: Yvette Rack., MD;  Location: [facility];  Service: Orthopedics;  Laterality: Left;    Allergies  Allergen Reactions  . Levaquin [Levofloxacin] Other (See Comments)    tendinopathy  . Morphine And Related Other (See Comments)    Made pt "wild"    Allergies as of 11/09/2020      Reactions   Levaquin [levofloxacin] Other (See Comments)   tendinopathy   Morphine And Related Other (See Comments)   Made pt "wild"      Medication List       Accurate as of November 09, 2020 11:59 PM. If you have any questions, ask your nurse or doctor.        acetaminophen 325 MG tablet Commonly known as: TYLENOL Take 650 mg by mouth every 6 (six) hours as needed for moderate pain.   chlorthalidone 25 MG tablet Commonly known as: HYGROTON TAKE 1 TABLET ONCE DAILY.   Cholecalciferol 50 MCG (2000 UT) Caps Take 2,000 Units by mouth every evening.   denosumab 60 MG/ML Soln injection Commonly known as: PROLIA Inject 60 mg into the skin every 6 (six) months. Administer in upper arm, thigh, or abdomen  diltiazem 180 MG 24 hr capsule Commonly known as: CARDIZEM CD TAKE 1 CAPSULE DAILY.   DULoxetine 60 MG capsule Commonly known as: CYMBALTA Take 60 mg by mouth daily.   Eliquis 2.5 MG Tabs tablet Generic drug: apixaban TAKE 1 TABLET BY MOUTH TWICE DAILY. What changed: how much to take   Flaxseed Oil 1000 MG Caps Take 1,000 mg by mouth 2 (two) times daily.   levothyroxine 112 MCG tablet Commonly known as: SYNTHROID Take 112 mcg by mouth daily before breakfast.   metoprolol tartrate 50 MG tablet Commonly known as: LOPRESSOR Take 50 mg by mouth 2 (two) times daily.   potassium chloride SA 20 MEQ tablet Commonly known as: KLOR-CON Take 20 mEq by mouth daily as needed.   PRESERVISION AREDS 2+MULTI VIT PO Take by mouth 2 (two) times daily. 644-03-47-4 mg capsule   saccharomyces boulardii 250 MG capsule Commonly known as:  FLORASTOR Take 250 mg by mouth daily.   senna 8.6 MG tablet Commonly known as: SENOKOT Take 2 tablets by mouth at bedtime.   sulfamethoxazole-trimethoprim 400-80 MG tablet Commonly known as: BACTRIM Take 1 tablet by mouth 2 (two) times daily.   temazepam 30 MG capsule Commonly known as: RESTORIL Take 1 capsule (30 mg total) by mouth at bedtime as needed for sleep.       Review of Systems  Constitutional: Negative for activity change, appetite change and fever.       ?#12Ibs weight gained in the past 10 days.   HENT: Positive for hearing loss. Negative for congestion, mouth sores, rhinorrhea, sore throat and trouble swallowing.   Eyes:       Wears glasses   Respiratory: Negative for cough, chest tightness, shortness of breath and wheezing.   Cardiovascular: Positive for leg swelling. Negative for chest pain and palpitations.  Gastrointestinal: Positive for constipation. Negative for abdominal pain, nausea and vomiting.       Has GERD, c/o indigestion.   Genitourinary: Negative for dysuria and frequency.  Musculoskeletal: Positive for arthralgias and gait problem. Negative for back pain.       Has history of fracture and replacement of right hip and left hip. Pain medication is helping with her left hip pain.   Skin: Negative for color change.  Neurological: Negative for tremors, seizures, syncope, light-headedness and headaches.  Hematological: Does not bruise/bleed easily.  Psychiatric/Behavioral: Positive for sleep disturbance. Negative for behavioral problems and confusion. The patient is nervous/anxious.     Immunization History  Administered Date(s) Administered  . Influenza Whole 03/05/2014  . Influenza,inj,Quad PF,6+ Mos 10/12/2012, 08/22/2016, 10/16/2017  . Influenza-Unspecified 08/27/2013, 10/01/2020  . PPD Test 10/16/2012  . Pneumococcal Polysaccharide-23 11/07/2011  . Unspecified SARS-COV-2 Vaccination 12/16/2019, 01/13/2020  . Zoster Recombinat (Shingrix)  11/28/2017, 02/26/2018   Pertinent  Health Maintenance Due  Topic Date Due  . FOOT EXAM  Never done  . OPHTHALMOLOGY EXAM  Never done  . URINE MICROALBUMIN  Never done  . DEXA SCAN  Never done  . PNA vac Low Risk Adult (2 of 2 - PCV13) 11/06/2012  . HEMOGLOBIN A1C  02/09/2016  . INFLUENZA VACCINE  Completed   Fall Risk  10/23/2019 10/16/2017 03/27/2017 11/14/2016 08/22/2016  Falls in the past year? 0 Yes No No No  Comment - - - - 07/2015  Number falls in past yr: 0 1 - - -  Injury with Fall? 0 Yes - - -  Risk for fall due to : - History of fall(s) - Impaired mobility Impaired mobility  Follow up - - - - -   Functional Status Survey:    Vitals:   11/09/20 1522  BP: 127/76  Pulse: 69  Resp: 18  Temp: (!) 97.4 F (36.3 C)  SpO2: 95%  Weight: 136 lb 6.4 oz (61.9 kg)   Body mass index is 24.16 kg/m. Physical Exam Constitutional:      Appearance: Normal appearance. She is well-developed. She is not diaphoretic.  HENT:     Head: Normocephalic and atraumatic.     Nose: Nose normal.     Mouth/Throat:     Mouth: Mucous membranes are moist.  Eyes:     Conjunctiva/sclera: Conjunctivae normal.     Pupils: Pupils are equal, round, and reactive to light.  Neck:     Thyroid: No thyromegaly.  Cardiovascular:     Rate and Rhythm: Normal rate and regular rhythm.     Heart sounds: No murmur heard.   Pulmonary:     Effort: Pulmonary effort is normal.     Breath sounds: Normal breath sounds.  Abdominal:     General: Bowel sounds are normal.     Palpations: Abdomen is soft.     Tenderness: There is no abdominal tenderness.  Musculoskeletal:     Cervical back: Normal range of motion and neck supple.     Right lower leg: Edema present.     Left lower leg: Edema present.     Comments: Can move all 4 extremities, ROM with left hip limited, able to bear weight to both LE, contact guard assist with transfers, using a walker with gait belt with therapy,1+ leg edema.   Lymphadenopathy:      Cervical: No cervical adenopathy.  Skin:    General: Skin is warm and dry.  Neurological:     Mental Status: She is alert and oriented to person, place, and time. Mental status is at baseline.     Motor: No weakness.     Coordination: Coordination normal.     Gait: Gait abnormal.  Psychiatric:        Mood and Affect: Mood normal.        Behavior: Behavior normal.     Labs reviewed: Recent Labs    10/04/20 0241 10/04/20 0241 10/05/20 0117 10/06/20 0201 10/15/20 0000  NA 129*   < > 126* 130* 137  K 3.8   < > 3.5 3.3* 3.9  CL 93*   < > 91* 92* 95*  CO2 29   < > 27 29 33*  GLUCOSE 116*  --  112* 105*  --   BUN 11   < > 10 15 13   CREATININE 0.81   < > 0.96 0.88 0.8  CALCIUM 9.0   < > 9.0 9.4 10.2  MG 1.6*  --  1.7 1.6*  --   PHOS 1.4*  --  2.1* 3.3  --    < > = values in this interval not displayed.   Recent Labs    10/04/20 0241 10/04/20 0241 10/05/20 0117 10/06/20 0201 10/15/20 0000  AST 14*   < > 16 18 11*  ALT 12   < > 14 15 11   ALKPHOS 73   < > 69 78 100  BILITOT 0.7  --  0.9 0.9  --   PROT 5.8*  --  5.7* 5.8*  --   ALBUMIN 3.0*   < > 2.8* 3.0* 3.8   < > = values in this interval not displayed.   Recent Labs  10/01/20 2104 10/01/20 2117 10/04/20 0241 10/04/20 0241 10/05/20 0117 10/06/20 0201 10/15/20 0000  WBC 10.4   < > 8.4   < > 9.9 8.5 7.9  NEUTROABS 7.3  --   --   --   --  4.7 4,629.00  HGB 15.6*   < > 13.1   < > 13.1 13.7 13.6  HCT 46.6*   < > 39.1   < > 38.5 40.3 40  MCV 93.2   < > 91.6  --  92.8 90.6  --   PLT 251   < > 220   < > 221 251 383   < > = values in this interval not displayed.   Lab Results  Component Value Date   TSH 1.197 10/02/2020   Lab Results  Component Value Date   HGBA1C 5.9 08/10/2015   Lab Results  Component Value Date   CHOL 116 04/26/2013   HDL 45 04/26/2013   LDLCALC 60 04/26/2013   TRIG 56 04/26/2013   CHOLHDL 2.6 04/26/2013    Significant Diagnostic Results in last 30 days:  XR FEMUR MIN 2 VIEWS  LEFT  Result Date: 10/22/2020 Stable partial hip replacement without any subsidence.  No evidence of worsening periprosthetic femur fracture.   Assessment/Plan: Swelling of extremity The patient requested to repeat scheduled Furosemide 20mg  qd/Kcl 13meq qd x 2 weeks, then prn as prior due to persisted swelling in legs, will update BMP upon completion of 2 weeks course of Furosemide.  Last 7 day course of Furosemide was 10/23/20, effective, but not total satisfactory.  Prior: Edema BLE, prn Furosemide. Chlorthalidone 25mg  qd. BNP 139 10/06/20. No echocardiogram located.  OP (osteoporosis) OP, takes Prolia, Vit D, cannot locate DEXA   Depression, recurrent ([place]) Depression, takes Duloxetine 60mg  qd.   Insomnia Insomnia, takes Temazepam 30mg  prn   Hypokalemia Hypokalemia, takes Kcl, K 3.9 10/15/20   Hypomagnesemia Hypomagnesemia, replaced, f/u Mg level one week, Mg 1.6 10/06/20   Hypophosphatemia Hypophosphatemia, replace, f/u P level one week. P 3.3 10/06/20   Hyponatremia Hyponatremia, SIADH, fluid restriction, Na 137 10/15/20, treated with Furosemide in the past  Hypothyroidism Hypothyroidism, takes Levothyroxine, TSH 1.197 10/02/20   Paroxysmal atrial fibrillation (HCC) Afib, takes Eliquis [place] vas 2 score >3, takes Diltiazem, Metoprolol   Closed intertrochanteric fracture with nonunion, left Hospitalization 10/01/20-10/06/20 for left hip pain, negative Xray, CT showed non displaced fx of the medial periprosthetic region along the left femur, Ortho recommended non weight bearing x 6 weeks, no surgical intervention, f/u Ortho    Family/ staff Communication: plan of care reviewed with the patient and charge nurse.   Labs/tests ordered:  None  Time spend 35 minutes.

## 2020-11-10 ENCOUNTER — Encounter: Payer: Self-pay | Admitting: Nurse Practitioner

## 2020-11-10 NOTE — Assessment & Plan Note (Signed)
Hypophosphatemia, replace, f/u P level one week. P 3.3 10/06/20

## 2020-11-10 NOTE — Assessment & Plan Note (Signed)
Afib, takes Eliquis [place] vas 2 score >3, takes Diltiazem, Metoprolol

## 2020-11-10 NOTE — Assessment & Plan Note (Signed)
Depression, takes Duloxetine 60mg  qd.

## 2020-11-10 NOTE — Assessment & Plan Note (Signed)
Hyponatremia, SIADH, fluid restriction, Na 137 10/15/20, treated with Furosemide in the past

## 2020-11-10 NOTE — Assessment & Plan Note (Signed)
Hypothyroidism, takes Levothyroxine, TSH 1.197 10/02/20

## 2020-11-10 NOTE — Assessment & Plan Note (Signed)
OP, takes Prolia, Vit D, cannot locate DEXA

## 2020-11-10 NOTE — Assessment & Plan Note (Signed)
Hospitalization 10/01/20-10/06/20 for left hip pain, negative Xray, CT showed non displaced fx of the medial periprosthetic region along the left femur, Ortho recommended non weight bearing x 6 weeks, no surgical intervention, f/u Ortho

## 2020-11-10 NOTE — Assessment & Plan Note (Signed)
Hypomagnesemia, replaced, f/u Mg level one week, Mg 1.6 10/06/20

## 2020-11-10 NOTE — Assessment & Plan Note (Signed)
Hypokalemia, takes Kcl, K 3.9 10/15/20

## 2020-11-10 NOTE — Assessment & Plan Note (Signed)
Insomnia, takes Temazepam 30mg  prn

## 2020-11-11 DIAGNOSIS — E039 Hypothyroidism, unspecified: Secondary | ICD-10-CM | POA: Diagnosis not present

## 2020-11-11 DIAGNOSIS — K440 Diaphragmatic hernia with obstruction, without gangrene: Secondary | ICD-10-CM | POA: Diagnosis not present

## 2020-11-11 DIAGNOSIS — M25552 Pain in left hip: Secondary | ICD-10-CM | POA: Diagnosis not present

## 2020-11-11 DIAGNOSIS — M9702XS Periprosthetic fracture around internal prosthetic left hip joint, sequela: Secondary | ICD-10-CM | POA: Diagnosis not present

## 2020-11-11 DIAGNOSIS — F339 Major depressive disorder, recurrent, unspecified: Secondary | ICD-10-CM | POA: Diagnosis not present

## 2020-11-11 DIAGNOSIS — R278 Other lack of coordination: Secondary | ICD-10-CM | POA: Diagnosis not present

## 2020-11-11 DIAGNOSIS — E1300 Other specified diabetes mellitus with hyperosmolarity without nonketotic hyperglycemic-hyperosmolar coma (NKHHC): Secondary | ICD-10-CM | POA: Diagnosis not present

## 2020-11-11 DIAGNOSIS — R29898 Other symptoms and signs involving the musculoskeletal system: Secondary | ICD-10-CM | POA: Diagnosis not present

## 2020-11-11 DIAGNOSIS — R296 Repeated falls: Secondary | ICD-10-CM | POA: Diagnosis not present

## 2020-11-11 DIAGNOSIS — M6281 Muscle weakness (generalized): Secondary | ICD-10-CM | POA: Diagnosis not present

## 2020-11-11 DIAGNOSIS — M1990 Unspecified osteoarthritis, unspecified site: Secondary | ICD-10-CM | POA: Diagnosis not present

## 2020-11-11 DIAGNOSIS — F5101 Primary insomnia: Secondary | ICD-10-CM | POA: Diagnosis not present

## 2020-11-11 DIAGNOSIS — I480 Paroxysmal atrial fibrillation: Secondary | ICD-10-CM | POA: Diagnosis not present

## 2020-11-11 DIAGNOSIS — S72402D Unspecified fracture of lower end of left femur, subsequent encounter for closed fracture with routine healing: Secondary | ICD-10-CM | POA: Diagnosis not present

## 2020-11-11 DIAGNOSIS — K219 Gastro-esophageal reflux disease without esophagitis: Secondary | ICD-10-CM | POA: Diagnosis not present

## 2020-11-11 DIAGNOSIS — R600 Localized edema: Secondary | ICD-10-CM | POA: Diagnosis not present

## 2020-11-11 DIAGNOSIS — E785 Hyperlipidemia, unspecified: Secondary | ICD-10-CM | POA: Diagnosis not present

## 2020-11-11 DIAGNOSIS — M21751 Unequal limb length (acquired), right femur: Secondary | ICD-10-CM | POA: Diagnosis not present

## 2020-11-11 DIAGNOSIS — D72829 Elevated white blood cell count, unspecified: Secondary | ICD-10-CM | POA: Diagnosis not present

## 2020-11-11 DIAGNOSIS — I10 Essential (primary) hypertension: Secondary | ICD-10-CM | POA: Diagnosis not present

## 2020-11-11 DIAGNOSIS — R2681 Unsteadiness on feet: Secondary | ICD-10-CM | POA: Diagnosis not present

## 2020-11-11 DIAGNOSIS — E782 Mixed hyperlipidemia: Secondary | ICD-10-CM | POA: Diagnosis not present

## 2020-11-11 DIAGNOSIS — M810 Age-related osteoporosis without current pathological fracture: Secondary | ICD-10-CM | POA: Diagnosis not present

## 2020-11-11 DIAGNOSIS — Z471 Aftercare following joint replacement surgery: Secondary | ICD-10-CM | POA: Diagnosis not present

## 2020-11-11 DIAGNOSIS — M25551 Pain in right hip: Secondary | ICD-10-CM | POA: Diagnosis not present

## 2020-11-11 DIAGNOSIS — S72092S Other fracture of head and neck of left femur, sequela: Secondary | ICD-10-CM | POA: Diagnosis not present

## 2020-11-11 DIAGNOSIS — T847XXS Infection and inflammatory reaction due to other internal orthopedic prosthetic devices, implants and grafts, sequela: Secondary | ICD-10-CM | POA: Diagnosis not present

## 2020-11-11 DIAGNOSIS — I4891 Unspecified atrial fibrillation: Secondary | ICD-10-CM | POA: Diagnosis not present

## 2020-11-11 DIAGNOSIS — Z4789 Encounter for other orthopedic aftercare: Secondary | ICD-10-CM | POA: Diagnosis not present

## 2020-11-11 DIAGNOSIS — K5909 Other constipation: Secondary | ICD-10-CM | POA: Diagnosis not present

## 2020-11-11 DIAGNOSIS — E871 Hypo-osmolality and hyponatremia: Secondary | ICD-10-CM | POA: Diagnosis not present

## 2020-11-11 DIAGNOSIS — R1313 Dysphagia, pharyngeal phase: Secondary | ICD-10-CM | POA: Diagnosis not present

## 2020-11-11 DIAGNOSIS — R41841 Cognitive communication deficit: Secondary | ICD-10-CM | POA: Diagnosis not present

## 2020-11-12 ENCOUNTER — Ambulatory Visit (INDEPENDENT_AMBULATORY_CARE_PROVIDER_SITE_OTHER): Payer: Medicare Other

## 2020-11-12 ENCOUNTER — Ambulatory Visit (INDEPENDENT_AMBULATORY_CARE_PROVIDER_SITE_OTHER): Payer: Medicare Other | Admitting: Orthopaedic Surgery

## 2020-11-12 ENCOUNTER — Encounter: Payer: Self-pay | Admitting: Orthopaedic Surgery

## 2020-11-12 VITALS — Ht 63.0 in | Wt 136.0 lb

## 2020-11-12 DIAGNOSIS — M25552 Pain in left hip: Secondary | ICD-10-CM

## 2020-11-12 NOTE — Progress Notes (Signed)
Office Visit Note   Patient: Leah Holland           Date of Birth: [birth year]           MRN: [id number] Visit Date: 11/12/2020              Requested by: [name], MD [place],  [place] [address] PCP: [name], MD   Assessment & Plan: Visit Diagnoses:  1. Pain in left hip     Plan: Impression is approximately 8 weeks status post left hip periprosthetic femur fracture.  Patient is clinically and radiographically doing well.  She will advance to weightbearing as tolerated to the left lower extremity.  She may advance with physical therapy as well.  She will follow up with [place] in 4 weeks time for repeat evaluation and 2 view x-rays of the left femur.  Call with concerns or questions in the meantime.  Follow-Up Instructions: Return in about 4 weeks (around 12/10/2020).   Orders:  Orders Placed This Encounter  Procedures  . XR FEMUR MIN 2 VIEWS LEFT   No orders of the defined types were placed in this encounter.     Procedures: No procedures performed   Clinical Data: No additional findings.   Subjective: Chief Complaint  Patient presents with  . Left Hip - Pain, Follow-up    Left periprosthetic fracture    HPI patient is a pleasant 86 year old female who comes in today with her son.  She is approximately 8 weeks out left hip periprosthetic femur fracture.  She has been doing well.  She has been compliant 25% weightbearing.  She has been working with physical therapy at [facility] making great progress.  She has no pain to the left hip.     Objective: Vital Signs: Ht 5\' 3"  (1.6 m)   Wt 136 lb (61.7 kg)   BMI 24.09 kg/m     Ortho Exam left hip exam shows a negative logroll.  She has full hip flexion without pain.  She is neurovascular intact distally.  Specialty Comments:  No specialty comments available.  Imaging: XR FEMUR MIN 2 VIEWS LEFT  Result Date: 11/12/2020 X-rays demonstrate bony consolidation to the fracture site.  No  hardware complications.    PMFS History: Patient Active Problem List   Diagnosis Date Noted  . Hypophosphatemia 10/07/2020  . Hypomagnesemia 10/07/2020  . Hypokalemia 10/07/2020  . Periprosthetic fracture around internal prosthetic left hip joint ([place]) 10/03/2020  . Leg fracture, left, closed, initial encounter 10/01/2020  . Hypertensive urgency 10/22/2018  . Chronic low back pain 12/21/2017  . Chronic antibiotic suppression 06/13/2017  . History of hip replacement 06/09/2017  . Ecchymosis 02/23/2017  . Closed intertrochanteric fracture with nonunion, left 01/26/2017  . Leukocytosis 01/26/2017  . Fall 01/26/2017  . Prosthetic hip infection ([place]) 08/22/2016  . Tendinopathy 08/22/2016  . Depression, recurrent ([place]) 04/07/2016  . Arthritis of knee, degenerative 04/07/2016  . OP (osteoporosis) 04/07/2016  . 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 Chronic anticoagulation 08/06/2015  . Bipolar disorder ([place]) 08/04/2015  . Hyponatremia 08/04/2015  . Constipation 08/04/2015  . Complication of anesthesia 07/30/2015  . Diabetes mellitus type 2, diet-controlled ([place])   . Skin cancer of face   . Paroxysmal atrial fibrillation ([place]) 09/01/2014  . Right carotid bruit 11/18/2013  . Postoperative anemia due to acute blood loss 03/03/2012  . Hypertension   . Hyperlipidemia   . Swelling of extremity   . Cataract   .  Hypothyroidism   . GERD (gastroesophageal reflux disease)   . Arthritis   . Insomnia    Past Medical History:  Diagnosis Date  . Anxiety attack    "take RX prn" (08/13/2013)  . Arthritis    "probably" (08/13/2013)  . Atrial fibrillation ([place])    new onset/pt report 08/13/2013  . Bronchitis 12/2011  . Complication of anesthesia   . Depression   . Diabetes mellitus type 2, diet-controlled ([place])    denies  . Femur fracture (HCC)    left proximal femur non-union  . GERD (gastroesophageal reflux disease)   . H/O hiatal hernia   . Hardware complicating wound infection  ([place]) 08/22/2016  . History of blood transfusion    "w/both knee OR's" (08/13/2013)  . Hyperlipidemia   . Hyperlipidemia   . Hypertension   . Hypertensive urgency 10/22/2018  . Hypothyroidism    takes synthroid  . Insomnia   . Intertrochanteric fracture of right hip ([place]) 07/30/2015  . Neuromuscular disorder ([place])    carpal tunnel in right hand  . Periprosthetic fracture around internal prosthetic right hip joint ([place]), nonunion basicervical femoral neck with broken hardware and subtrochanteric fracture 03/16/2016  . PONV (postoperative nausea and vomiting)   . Prosthetic hip infection ([place]) 08/22/2016  . Proteus infection 08/22/2016  . Serratia infection 11/14/2016  . Skin cancer of face 2000's   "one on each side of my face and one on my nose" (08/13/2013)  . Swelling of extremity    sees Dr. Gwenlyn Found, cardiologist 1 x year [phone]  . Tendon dysfunction 08/22/2016  . Wears hearing aid    HOH    Family History  Problem Relation Age of Onset  . Arrhythmia Mother   . Heart disease Mother   . Arrhythmia Sister   . Stroke Father 71  . Heart disease Brother   . Heart disease Brother   . Cancer Brother        Colon cancer  . Arrhythmia Brother   . Heart disease Brother   . Heart disease Sister   . Heart disease Sister   . Stroke Sister   . Diabetes Sister   . Heart disease Child 12  . Heart disease Child 65    Past Surgical History:  Procedure Laterality Date  . 2D ECHOCARDIOGRAM  12/07/2004   EF 48%, moderate pulmonary hypertension  . BREAST BIOPSY Left ?1970's  . CARDIOVASCULAR STRESS TEST  12/07/11  . CARDIOVASCULAR STRESS TEST  12/07/2011   LEXISCAN, normal scan, no significant wall abnormalities noted  . CARPAL TUNNEL RELEASE Right ~ 2005  . CATARACT EXTRACTION, BILATERAL Bilateral 2000  . DILATION AND CURETTAGE OF UTERUS  1950's  . DOPPLER ECHOCARDIOGRAPHY  12/07/11  . EYE SURGERY    . FEMUR IM NAIL Right 08/01/2015   Procedure: INTRAMEDULLARY (IM) NAIL FEMORAL;   Surgeon: Marchia Bond, MD;  Location: [facility];  Service: Orthopedics;  Laterality: Right;  . HARDWARE REMOVAL Right 03/16/2016   Procedure: HARDWARE REMOVAL;  Surgeon: Marchia Bond, MD;  Location: [facility];  Service: Orthopedics;  Laterality: Right;  . INTRAMEDULLARY (IM) NAIL INTERTROCHANTERIC Left 01/26/2017   Procedure: INTRAMEDULLARY (IM) NAIL INTERTROCHANTRIC;  Surgeon: Leandrew Koyanagi, MD;  Location: [facility];  Service: Orthopedics;  Laterality: Left;  . JOINT REPLACEMENT     bilateral knee replacement  . SKIN CANCER EXCISION  2000's   "off my nose" (08/13/2013)  . TOTAL HIP ARTHROPLASTY Right 03/16/2016   Procedure: TOTAL HIP ARTHROPLASTY;  Surgeon: Marchia Bond, MD;  Location: [facility];  Service: Orthopedics;  Laterality: Right;  . TOTAL HIP ARTHROPLASTY Left 06/09/2017   Procedure: REMOVE LEFT HIP INTRAMEDULLARY NAIL AND CONVERT TO LEFT CEMENTED HEMI HIP ARTHROPLASTY;  Surgeon: Leandrew Koyanagi, MD;  Location: [facility];  Service: Orthopedics;  Laterality: Left;  . TOTAL KNEE ARTHROPLASTY Right 2005  . TOTAL KNEE ARTHROPLASTY  03/02/2012   Procedure: TOTAL KNEE ARTHROPLASTY;  Surgeon: Yvette Rack., MD;  Location: [facility];  Service: Orthopedics;  Laterality: Left;   Social History   Occupational History  . Not on file  Tobacco Use  . Smoking status: Never Smoker  . Smokeless tobacco: Never Used  Substance and Sexual Activity  . Alcohol use: No  . Drug use: No  . Sexual activity: Never

## 2020-11-16 ENCOUNTER — Encounter: Payer: Self-pay | Admitting: Internal Medicine

## 2020-11-16 ENCOUNTER — Non-Acute Institutional Stay (SKILLED_NURSING_FACILITY): Payer: Medicare Other | Admitting: Internal Medicine

## 2020-11-16 DIAGNOSIS — M9702XS Periprosthetic fracture around internal prosthetic left hip joint, sequela: Secondary | ICD-10-CM | POA: Diagnosis not present

## 2020-11-16 DIAGNOSIS — E039 Hypothyroidism, unspecified: Secondary | ICD-10-CM

## 2020-11-16 DIAGNOSIS — F339 Major depressive disorder, recurrent, unspecified: Secondary | ICD-10-CM | POA: Diagnosis not present

## 2020-11-16 DIAGNOSIS — R600 Localized edema: Secondary | ICD-10-CM | POA: Diagnosis not present

## 2020-11-16 DIAGNOSIS — M810 Age-related osteoporosis without current pathological fracture: Secondary | ICD-10-CM | POA: Diagnosis not present

## 2020-11-16 DIAGNOSIS — E782 Mixed hyperlipidemia: Secondary | ICD-10-CM | POA: Diagnosis not present

## 2020-11-16 DIAGNOSIS — E871 Hypo-osmolality and hyponatremia: Secondary | ICD-10-CM | POA: Diagnosis not present

## 2020-11-16 DIAGNOSIS — I10 Essential (primary) hypertension: Secondary | ICD-10-CM | POA: Diagnosis not present

## 2020-11-16 DIAGNOSIS — I480 Paroxysmal atrial fibrillation: Secondary | ICD-10-CM

## 2020-11-16 NOTE — Progress Notes (Signed)
Location:   [facility] Room Number: 34 Place of Service:  SNF [phone])  Provider: Veleta Miners MD  PCP: [name], MD Patient Care Team: [name], MD as PCP - General (Family Medicine) Lorretta Harp, MD as PCP - Cardiology (Cardiology) Earlie Server, MD as Consulting Physician (Orthopedic Surgery) Lorretta Harp, MD as Consulting Physician (Cardiology) Calvert Cantor, MD as Consulting Physician (Ophthalmology) [name], MD as Attending Physician [facility])  Extended Emergency Contact Information Primary Emergency Contact: Ponciano Ort Address: [address]          [place], [place] [address] Johnnette Litter of [place] Mobile Phone: [phone] Relation: Son Secondary Emergency Contact: [place], [place] [address] Johnnette Litter of [place] Phone: [phone] Relation: Son  Code Status: DNR Goals of care:  Advanced Directive information Advanced Directives 10/13/2020  Does Patient Have a Medical Advance Directive? Yes  Type of Advance Directive Living will  Does patient want to make changes to medical advance directive? No - Patient declined  Copy of [place] in Chart? -  Would patient like information on creating a medical advance directive? -  Pre-existing out of facility DNR order (yellow form or pink MOST form) -     Allergies  Allergen Reactions  . Levaquin [Levofloxacin] Other (See Comments)    tendinopathy  . Morphine And Related Other (See Comments)    Made pt "wild"    Chief Complaint  Patient presents with  . Discharge Note    Discharge from SNF    HPI:  86 y.o. female  For discharge from the facility to her apartment   Patient was admitted in the hospital from 10/21-10/26 for left Hip Pain and Found to have  spontaneous femoral shaft fracture managed conservatively. Patient has previous history of bilateral knee replacement and bilateral hip  prosthesis with chronic hip prosthesis infection on chronic antibiotic therapy,  hypertension, hyperlipidemia,  PAF on Eliquis.   Also history of osteoporosis.  On Prolia  She was admitted to SNF for therapy and Care Did well and is now walking with no assist Pain is controlled Feels she can manage by herself at her apartment Did need increased dose of Lasix for her LE edema     Past Medical History:  Diagnosis Date  . Anxiety attack    "take RX prn" (08/13/2013)  . Arthritis    "probably" (08/13/2013)  . Atrial fibrillation ([place])    new onset/pt report 08/13/2013  . Bronchitis 12/2011  . Complication of anesthesia   . Depression   . Diabetes mellitus type 2, diet-controlled ([place])    denies  . Femur fracture ([facility])    left proximal femur non-union  . GERD (gastroesophageal reflux disease)   . H/O hiatal hernia   . Hardware complicating wound infection ([place]) 08/22/2016  . History of blood transfusion    "w/both knee OR's" (08/13/2013)  . Hyperlipidemia   . Hyperlipidemia   . Hypertension   . Hypertensive urgency 10/22/2018  . Hypothyroidism    takes synthroid  . Insomnia   . Intertrochanteric fracture of right hip ([place]) 07/30/2015  . Neuromuscular disorder ([place])    carpal tunnel in right hand  . Periprosthetic fracture around internal prosthetic right hip joint ([place]), nonunion basicervical femoral neck with broken hardware and subtrochanteric fracture 03/16/2016  . PONV (postoperative nausea and vomiting)   . Prosthetic hip infection ([place]) 08/22/2016  .  Proteus infection 08/22/2016  . Serratia infection 11/14/2016  . Skin cancer of face 2000's   "one on each side of my face and one on my nose" (08/13/2013)  . Swelling of extremity    sees Dr. Gwenlyn Found, cardiologist 1 x year [phone]  . Tendon dysfunction 08/22/2016  . Wears hearing aid    [facility]    Past Surgical History:  Procedure Laterality Date  . 2D ECHOCARDIOGRAM  12/07/2004   EF 48%, moderate pulmonary hypertension  .  BREAST BIOPSY Left ?1970's  . CARDIOVASCULAR STRESS TEST  12/07/11  . CARDIOVASCULAR STRESS TEST  12/07/2011   LEXISCAN, normal scan, no significant wall abnormalities noted  . CARPAL TUNNEL RELEASE Right ~ 2005  . CATARACT EXTRACTION, BILATERAL Bilateral 2000  . DILATION AND CURETTAGE OF UTERUS  1950's  . DOPPLER ECHOCARDIOGRAPHY  12/07/11  . EYE SURGERY    . FEMUR IM NAIL Right 08/01/2015   Procedure: INTRAMEDULLARY (IM) NAIL FEMORAL;  Surgeon: Marchia Bond, MD;  Location: [facility];  Service: Orthopedics;  Laterality: Right;  . HARDWARE REMOVAL Right 03/16/2016   Procedure: HARDWARE REMOVAL;  Surgeon: Marchia Bond, MD;  Location: [facility];  Service: Orthopedics;  Laterality: Right;  . INTRAMEDULLARY (IM) NAIL INTERTROCHANTERIC Left 01/26/2017   Procedure: INTRAMEDULLARY (IM) NAIL INTERTROCHANTRIC;  Surgeon: Leandrew Koyanagi, MD;  Location: [facility];  Service: Orthopedics;  Laterality: Left;  . JOINT REPLACEMENT     bilateral knee replacement  . SKIN CANCER EXCISION  2000's   "off my nose" (08/13/2013)  . TOTAL HIP ARTHROPLASTY Right 03/16/2016   Procedure: TOTAL HIP ARTHROPLASTY;  Surgeon: Marchia Bond, MD;  Location: [facility];  Service: Orthopedics;  Laterality: Right;  . TOTAL HIP ARTHROPLASTY Left 06/09/2017   Procedure: REMOVE LEFT HIP INTRAMEDULLARY NAIL AND CONVERT TO LEFT CEMENTED HEMI HIP ARTHROPLASTY;  Surgeon: Leandrew Koyanagi, MD;  Location: [facility];  Service: Orthopedics;  Laterality: Left;  . TOTAL KNEE ARTHROPLASTY Right 2005  . TOTAL KNEE ARTHROPLASTY  03/02/2012   Procedure: TOTAL KNEE ARTHROPLASTY;  Surgeon: Yvette Rack., MD;  Location: [facility];  Service: Orthopedics;  Laterality: Left;      reports that she has never smoked. She has never used smokeless tobacco. She reports that she does not drink alcohol and does not use drugs. Social History   Socioeconomic History  . Marital status: Widowed    Spouse name: Not on file  . Number of children: Not on file  . Years of education: Not on  file  . Highest education level: Not on file  Occupational History  . Not on file  Tobacco Use  . Smoking status: Never Smoker  . Smokeless tobacco: Never Used  Substance and Sexual Activity  . Alcohol use: No  . Drug use: No  . Sexual activity: Never  Other Topics Concern  . Not on file  Social History Narrative   Widowed. Lives at [facility]. Son died in the [place] War and she has not slept well since then.   Admitted to [facility] 01/30/17   Never smoked   Alcohol none   Full Code   Social Determinants of Health   Financial Resource Strain:   . Difficulty of Paying Living Expenses: Not on file  Food Insecurity:   . Worried About Charity fundraiser in the Last Year: Not on file  . Ran Out of Food in the Last Year: Not on file  Transportation Needs:   . Lack of Transportation (Medical):  Not on file  . Lack of Transportation (Non-Medical): Not on file  Physical Activity:   . Days of Exercise per Week: Not on file  . Minutes of Exercise per Session: Not on file  Stress:   . Feeling of Stress : Not on file  Social Connections:   . Frequency of Communication with Friends and Family: Not on file  . Frequency of Social Gatherings with Friends and Family: Not on file  . Attends Religious Services: Not on file  . Active Member of Clubs or Organizations: Not on file  . Attends Archivist Meetings: Not on file  . Marital Status: Not on file  Intimate Partner Violence:   . Fear of Current or Ex-Partner: Not on file  . Emotionally Abused: Not on file  . Physically Abused: Not on file  . Sexually Abused: Not on file   Functional Status Survey:    Allergies  Allergen Reactions  . Levaquin [Levofloxacin] Other (See Comments)    tendinopathy  . Morphine And Related Other (See Comments)    Made pt "wild"    Pertinent  Health Maintenance Due  Topic Date Due  . FOOT EXAM  Never done  . OPHTHALMOLOGY EXAM  Never done  . URINE MICROALBUMIN   Never done  . DEXA SCAN  Never done  . PNA vac Low Risk Adult (2 of 2 - PCV13) 11/06/2012  . HEMOGLOBIN A1C  02/09/2016  . INFLUENZA VACCINE  Completed    Medications: Allergies as of 11/16/2020      Reactions   Levaquin [levofloxacin] Other (See Comments)   tendinopathy   Morphine And Related Other (See Comments)   Made pt "wild"      Medication List       Accurate as of November 16, 2020  1:07 PM. If you have any questions, ask your nurse or doctor.        acetaminophen 325 MG tablet Commonly known as: TYLENOL Take 650 mg by mouth every 6 (six) hours as needed for moderate pain.   chlorthalidone 25 MG tablet Commonly known as: HYGROTON TAKE 1 TABLET ONCE DAILY.   Cholecalciferol 50 MCG (2000 UT) Caps Take 2,000 Units by mouth every evening.   denosumab 60 MG/ML Soln injection Commonly known as: PROLIA Inject 60 mg into the skin every 6 (six) months. Administer in upper arm, thigh, or abdomen   diltiazem 180 MG 24 hr capsule Commonly known as: CARDIZEM CD TAKE 1 CAPSULE DAILY.   DULoxetine 60 MG capsule Commonly known as: CYMBALTA Take 60 mg by mouth daily.   Eliquis 2.5 MG Tabs tablet Generic drug: apixaban TAKE 1 TABLET BY MOUTH TWICE DAILY. What changed: how much to take   esomeprazole 40 MG capsule Commonly known as: NEXIUM Take 40 mg by mouth daily at 12 noon.   Flaxseed Oil 1000 MG Caps Take 1,000 mg by mouth 2 (two) times daily.   furosemide 20 MG tablet Commonly known as: LASIX Take 20 mg by mouth daily as needed.   levothyroxine 112 MCG tablet Commonly known as: SYNTHROID Take 112 mcg by mouth daily before breakfast.   magnesium hydroxide 400 MG/5ML suspension Commonly known as: MILK OF MAGNESIA Take 30 mLs by mouth every other day.   metoprolol tartrate 50 MG tablet Commonly known as: LOPRESSOR Take 50 mg by mouth 2 (two) times daily.   potassium chloride SA 20 MEQ tablet Commonly known as: KLOR-CON Take 20 mEq by mouth daily as  needed.   PRESERVISION  AREDS 2+MULTI VIT PO Take by mouth 2 (two) times daily. 237-62-83-1 mg capsule   saccharomyces boulardii 250 MG capsule Commonly known as: FLORASTOR Take 250 mg by mouth daily.   senna 8.6 MG tablet Commonly known as: SENOKOT Take 2 tablets by mouth at bedtime.   sulfamethoxazole-trimethoprim 400-80 MG tablet Commonly known as: BACTRIM Take 1 tablet by mouth 2 (two) times daily.   temazepam 30 MG capsule Commonly known as: RESTORIL Take 1 capsule (30 mg total) by mouth at bedtime as needed for sleep.       Review of Systems  Vitals:   11/16/20 1116  BP: 120/60  Pulse: 76  Resp: 20  Temp: 97.8 F (36.6 C)  SpO2: 94%  Weight: 130 lb 12.8 oz (59.3 kg)  Height: 5\' 3"  (1.6 m)   Body mass index is 23.17 kg/m. Physical Exam  Constitutional: Oriented to person, place, and time. Well-developed and well-nourished.  HENT:  Head: Normocephalic.  Mouth/Throat: Oropharynx is clear and moist.  Eyes: Pupils are equal, round, and reactive to light.  Neck: Neck supple.  Cardiovascular: Normal rate and normal heart sounds.  No murmur heard. Pulmonary/Chest: Effort normal and breath sounds normal. No respiratory distress. No wheezes. She has no rales.  Abdominal: Soft. Bowel sounds are normal. No distension. There is no tenderness. There is no rebound.  Musculoskeletal:Edema Present in Left Leg but improved Lymphadenopathy: none Neurological: Alert and oriented to person, place, and time. Walking with her walker Skin: Skin is warm and dry.  Psychiatric: Normal mood and affect. Behavior is normal. Thought content normal.    Labs reviewed: Basic Metabolic Panel: Recent Labs    10/04/20 0241 10/04/20 0241 10/05/20 0117 10/05/20 0117 10/06/20 0201 10/15/20 0000 10/29/20 0000  NA 129*   < > 126*   < > 130* 137 134*  K 3.8   < > 3.5   < > 3.3* 3.9 3.7  CL 93*   < > 91*   < > 92* 95* 92*  CO2 29   < > 27   < > 29 33* 37*  GLUCOSE 116*  --  112*  --   105*  --   --   BUN 11   < > 10   < > 15 13 18   CREATININE 0.81   < > 0.96   < > 0.88 0.8 1.0  CALCIUM 9.0   < > 9.0   < > 9.4 10.2 10.4  MG 1.6*  --  1.7  --  1.6*  --   --   PHOS 1.4*  --  2.1*  --  3.3  --   --    < > = values in this interval not displayed.   Liver Function Tests: Recent Labs    10/04/20 0241 10/04/20 0241 10/05/20 0117 10/06/20 0201 10/15/20 0000  AST 14*   < > 16 18 11*  ALT 12   < > 14 15 11   ALKPHOS 73   < > 69 78 100  BILITOT 0.7  --  0.9 0.9  --   PROT 5.8*  --  5.7* 5.8*  --   ALBUMIN 3.0*   < > 2.8* 3.0* 3.8   < > = values in this interval not displayed.   No results for input(s): LIPASE, AMYLASE in the last 8760 hours. No results for input(s): AMMONIA in the last 8760 hours. CBC: Recent Labs    10/01/20 2104 10/01/20 2117 10/04/20 0241 10/04/20 0241 10/05/20 0117 10/06/20 0201  10/15/20 0000  WBC 10.4   < > 8.4   < > 9.9 8.5 7.9  NEUTROABS 7.3  --   --   --   --  4.7 4,629.00  HGB 15.6*   < > 13.1   < > 13.1 13.7 13.6  HCT 46.6*   < > 39.1   < > 38.5 40.3 40  MCV 93.2   < > 91.6  --  92.8 90.6  --   PLT 251   < > 220   < > 221 251 383   < > = values in this interval not displayed.   Cardiac Enzymes: No results for input(s): CKTOTAL, CKMB, CKMBINDEX, TROPONINI in the last 8760 hours. BNP: Invalid input(s): POCBNP CBG: No results for input(s): GLUCAP in the last 8760 hours.  Procedures and Imaging Studies During Stay: XR FEMUR MIN 2 VIEWS LEFT  Result Date: 11/12/2020 X-rays demonstrate bony consolidation to the fracture site.  No hardware complications.  XR FEMUR MIN 2 VIEWS LEFT  Result Date: 10/22/2020 Stable partial hip replacement without any subsidence.  No evidence of worsening periprosthetic femur fracture.   Assessment/Plan:    Bilateral leg edema She says her edema is at baseline Will use Lasix PRN  Periprosthetic fracture around internal prosthetic left hip joint, sequela Pain Controlled Weight bearing as  tolerated Will have Home therapy  Follow up with Ortho Paroxysmal atrial fibrillation (HCC) On Diltiazem,Toprol  and Eliquis Osteoporosis, unspecified osteoporosis type, unspecified pathological fracture presence On Prolia Depression, recurrent (HCC) Conitnue Cymbalta Hyponatremia Will Need follow up of her Sodium as outpatient by her PCP  Hypothyroidism, unspecified type TSH normal in 10/21  Insomnia On Restoril   Patient is being discharged with the following home health services:  Home PT/OT/ST  Patient is being discharged with the following durable medical equipment:  none  Patient has been advised to f/u with their PCP in 1-2 weeks to bring them up to date on their rehab stay.  Social services at facility was responsible for arranging this appointment.   Future labs/tests needed:

## 2020-11-19 DIAGNOSIS — R2681 Unsteadiness on feet: Secondary | ICD-10-CM | POA: Diagnosis not present

## 2020-11-19 DIAGNOSIS — M6281 Muscle weakness (generalized): Secondary | ICD-10-CM | POA: Diagnosis not present

## 2020-11-19 DIAGNOSIS — S72402D Unspecified fracture of lower end of left femur, subsequent encounter for closed fracture with routine healing: Secondary | ICD-10-CM | POA: Diagnosis not present

## 2020-11-19 DIAGNOSIS — R29898 Other symptoms and signs involving the musculoskeletal system: Secondary | ICD-10-CM | POA: Diagnosis not present

## 2020-11-19 DIAGNOSIS — M9702XD Periprosthetic fracture around internal prosthetic left hip joint, subsequent encounter: Secondary | ICD-10-CM | POA: Diagnosis not present

## 2020-11-23 DIAGNOSIS — R29898 Other symptoms and signs involving the musculoskeletal system: Secondary | ICD-10-CM | POA: Diagnosis not present

## 2020-11-23 DIAGNOSIS — R2681 Unsteadiness on feet: Secondary | ICD-10-CM | POA: Diagnosis not present

## 2020-11-23 DIAGNOSIS — M9702XD Periprosthetic fracture around internal prosthetic left hip joint, subsequent encounter: Secondary | ICD-10-CM | POA: Diagnosis not present

## 2020-11-23 DIAGNOSIS — S72402D Unspecified fracture of lower end of left femur, subsequent encounter for closed fracture with routine healing: Secondary | ICD-10-CM | POA: Diagnosis not present

## 2020-11-23 DIAGNOSIS — M6281 Muscle weakness (generalized): Secondary | ICD-10-CM | POA: Diagnosis not present

## 2020-11-25 DIAGNOSIS — G4700 Insomnia, unspecified: Secondary | ICD-10-CM | POA: Diagnosis not present

## 2020-11-25 DIAGNOSIS — F329 Major depressive disorder, single episode, unspecified: Secondary | ICD-10-CM | POA: Diagnosis not present

## 2020-11-25 DIAGNOSIS — I4891 Unspecified atrial fibrillation: Secondary | ICD-10-CM | POA: Diagnosis not present

## 2020-11-25 DIAGNOSIS — E785 Hyperlipidemia, unspecified: Secondary | ICD-10-CM | POA: Diagnosis not present

## 2020-11-25 DIAGNOSIS — E039 Hypothyroidism, unspecified: Secondary | ICD-10-CM | POA: Diagnosis not present

## 2020-11-25 DIAGNOSIS — M810 Age-related osteoporosis without current pathological fracture: Secondary | ICD-10-CM | POA: Diagnosis not present

## 2020-11-25 DIAGNOSIS — I10 Essential (primary) hypertension: Secondary | ICD-10-CM | POA: Diagnosis not present

## 2020-11-25 DIAGNOSIS — D509 Iron deficiency anemia, unspecified: Secondary | ICD-10-CM | POA: Diagnosis not present

## 2020-11-26 DIAGNOSIS — M6281 Muscle weakness (generalized): Secondary | ICD-10-CM | POA: Diagnosis not present

## 2020-11-26 DIAGNOSIS — R29898 Other symptoms and signs involving the musculoskeletal system: Secondary | ICD-10-CM | POA: Diagnosis not present

## 2020-11-26 DIAGNOSIS — S72402D Unspecified fracture of lower end of left femur, subsequent encounter for closed fracture with routine healing: Secondary | ICD-10-CM | POA: Diagnosis not present

## 2020-11-26 DIAGNOSIS — R2681 Unsteadiness on feet: Secondary | ICD-10-CM | POA: Diagnosis not present

## 2020-11-26 DIAGNOSIS — M9702XD Periprosthetic fracture around internal prosthetic left hip joint, subsequent encounter: Secondary | ICD-10-CM | POA: Diagnosis not present

## 2020-11-30 DIAGNOSIS — R2681 Unsteadiness on feet: Secondary | ICD-10-CM | POA: Diagnosis not present

## 2020-11-30 DIAGNOSIS — M6281 Muscle weakness (generalized): Secondary | ICD-10-CM | POA: Diagnosis not present

## 2020-11-30 DIAGNOSIS — R29898 Other symptoms and signs involving the musculoskeletal system: Secondary | ICD-10-CM | POA: Diagnosis not present

## 2020-11-30 DIAGNOSIS — S72402D Unspecified fracture of lower end of left femur, subsequent encounter for closed fracture with routine healing: Secondary | ICD-10-CM | POA: Diagnosis not present

## 2020-11-30 DIAGNOSIS — M9702XD Periprosthetic fracture around internal prosthetic left hip joint, subsequent encounter: Secondary | ICD-10-CM | POA: Diagnosis not present

## 2020-12-02 DIAGNOSIS — M6281 Muscle weakness (generalized): Secondary | ICD-10-CM | POA: Diagnosis not present

## 2020-12-02 DIAGNOSIS — R29898 Other symptoms and signs involving the musculoskeletal system: Secondary | ICD-10-CM | POA: Diagnosis not present

## 2020-12-02 DIAGNOSIS — R2681 Unsteadiness on feet: Secondary | ICD-10-CM | POA: Diagnosis not present

## 2020-12-02 DIAGNOSIS — M9702XD Periprosthetic fracture around internal prosthetic left hip joint, subsequent encounter: Secondary | ICD-10-CM | POA: Diagnosis not present

## 2020-12-02 DIAGNOSIS — S72402D Unspecified fracture of lower end of left femur, subsequent encounter for closed fracture with routine healing: Secondary | ICD-10-CM | POA: Diagnosis not present

## 2020-12-07 DIAGNOSIS — R29898 Other symptoms and signs involving the musculoskeletal system: Secondary | ICD-10-CM | POA: Diagnosis not present

## 2020-12-07 DIAGNOSIS — M9702XD Periprosthetic fracture around internal prosthetic left hip joint, subsequent encounter: Secondary | ICD-10-CM | POA: Diagnosis not present

## 2020-12-07 DIAGNOSIS — R2681 Unsteadiness on feet: Secondary | ICD-10-CM | POA: Diagnosis not present

## 2020-12-07 DIAGNOSIS — S72402D Unspecified fracture of lower end of left femur, subsequent encounter for closed fracture with routine healing: Secondary | ICD-10-CM | POA: Diagnosis not present

## 2020-12-07 DIAGNOSIS — M6281 Muscle weakness (generalized): Secondary | ICD-10-CM | POA: Diagnosis not present

## 2020-12-09 DIAGNOSIS — S72402D Unspecified fracture of lower end of left femur, subsequent encounter for closed fracture with routine healing: Secondary | ICD-10-CM | POA: Diagnosis not present

## 2020-12-09 DIAGNOSIS — M6281 Muscle weakness (generalized): Secondary | ICD-10-CM | POA: Diagnosis not present

## 2020-12-09 DIAGNOSIS — R29898 Other symptoms and signs involving the musculoskeletal system: Secondary | ICD-10-CM | POA: Diagnosis not present

## 2020-12-09 DIAGNOSIS — M9702XD Periprosthetic fracture around internal prosthetic left hip joint, subsequent encounter: Secondary | ICD-10-CM | POA: Diagnosis not present

## 2020-12-09 DIAGNOSIS — R2681 Unsteadiness on feet: Secondary | ICD-10-CM | POA: Diagnosis not present

## 2020-12-15 DIAGNOSIS — R29898 Other symptoms and signs involving the musculoskeletal system: Secondary | ICD-10-CM | POA: Diagnosis not present

## 2020-12-15 DIAGNOSIS — M9702XD Periprosthetic fracture around internal prosthetic left hip joint, subsequent encounter: Secondary | ICD-10-CM | POA: Diagnosis not present

## 2020-12-15 DIAGNOSIS — S72402D Unspecified fracture of lower end of left femur, subsequent encounter for closed fracture with routine healing: Secondary | ICD-10-CM | POA: Diagnosis not present

## 2020-12-15 DIAGNOSIS — M6281 Muscle weakness (generalized): Secondary | ICD-10-CM | POA: Diagnosis not present

## 2020-12-15 DIAGNOSIS — R2681 Unsteadiness on feet: Secondary | ICD-10-CM | POA: Diagnosis not present

## 2020-12-17 ENCOUNTER — Telehealth: Payer: Self-pay

## 2020-12-17 ENCOUNTER — Other Ambulatory Visit: Payer: Self-pay | Admitting: Infectious Disease

## 2020-12-17 DIAGNOSIS — S72402D Unspecified fracture of lower end of left femur, subsequent encounter for closed fracture with routine healing: Secondary | ICD-10-CM | POA: Diagnosis not present

## 2020-12-17 DIAGNOSIS — M6281 Muscle weakness (generalized): Secondary | ICD-10-CM | POA: Diagnosis not present

## 2020-12-17 DIAGNOSIS — M9702XD Periprosthetic fracture around internal prosthetic left hip joint, subsequent encounter: Secondary | ICD-10-CM | POA: Diagnosis not present

## 2020-12-17 DIAGNOSIS — R2681 Unsteadiness on feet: Secondary | ICD-10-CM | POA: Diagnosis not present

## 2020-12-17 DIAGNOSIS — R29898 Other symptoms and signs involving the musculoskeletal system: Secondary | ICD-10-CM | POA: Diagnosis not present

## 2020-12-17 NOTE — Telephone Encounter (Signed)
She should continue on this antibiotic and be seen by me in the clinic

## 2020-12-17 NOTE — Telephone Encounter (Signed)
Pending on Md's message.

## 2020-12-17 NOTE — Telephone Encounter (Signed)
Received refill request for Bactrim 80 mg tablet. Patient last seen on 10/23/2019. Patient was supposed to follow up in 2021 after one year on medication. Will forward message to MD to advise on refill. Also if patient needs appt.  Lorenso Courier, [place]

## 2020-12-21 DIAGNOSIS — M6281 Muscle weakness (generalized): Secondary | ICD-10-CM | POA: Diagnosis not present

## 2020-12-21 DIAGNOSIS — R29898 Other symptoms and signs involving the musculoskeletal system: Secondary | ICD-10-CM | POA: Diagnosis not present

## 2020-12-21 DIAGNOSIS — M9702XD Periprosthetic fracture around internal prosthetic left hip joint, subsequent encounter: Secondary | ICD-10-CM | POA: Diagnosis not present

## 2020-12-21 DIAGNOSIS — R2681 Unsteadiness on feet: Secondary | ICD-10-CM | POA: Diagnosis not present

## 2020-12-21 DIAGNOSIS — S72402D Unspecified fracture of lower end of left femur, subsequent encounter for closed fracture with routine healing: Secondary | ICD-10-CM | POA: Diagnosis not present

## 2020-12-23 ENCOUNTER — Telehealth: Payer: Self-pay | Admitting: General Practice

## 2020-12-23 DIAGNOSIS — M9702XD Periprosthetic fracture around internal prosthetic left hip joint, subsequent encounter: Secondary | ICD-10-CM | POA: Diagnosis not present

## 2020-12-23 DIAGNOSIS — R2681 Unsteadiness on feet: Secondary | ICD-10-CM | POA: Diagnosis not present

## 2020-12-23 DIAGNOSIS — M6281 Muscle weakness (generalized): Secondary | ICD-10-CM | POA: Diagnosis not present

## 2020-12-23 DIAGNOSIS — S72402D Unspecified fracture of lower end of left femur, subsequent encounter for closed fracture with routine healing: Secondary | ICD-10-CM | POA: Diagnosis not present

## 2020-12-23 DIAGNOSIS — R29898 Other symptoms and signs involving the musculoskeletal system: Secondary | ICD-10-CM | POA: Diagnosis not present

## 2020-12-23 NOTE — Telephone Encounter (Signed)
Spoke with pt, she has been taking PT since she broke her leg and for the last several days she and the therapist has noticed and increase in SOB. She denies any swelling in her feet, ankles or abdomen. She reports SOB when taking a shower and walking to the dinning room. The therapist was also concerned because the patients heart rate will elevate 10 bpm when she stands from sitting. The patient reports she has had orthostatic problems with her bp for some time and just gets up slowly. She will keep the appointment Friday this week and will call with problems prior to then.

## 2020-12-23 NOTE — Telephone Encounter (Signed)
Pt c/o Shortness Of Breath: STAT if SOB developed within the last 24 hours or pt is noticeably SOB on the phone  1. Are you currently SOB (can you hear that pt is SOB on the phone)? No, cannot hear over phone   2. How long have you been experiencing SOB? Started in December started noticing it more in recently   3. Are you SOB when sitting or when up moving around? Moving around   4. Are you currently experiencing any other symptoms? HR being 10 beats per minute per therapist as well as dizziness when standing up due to BP dropping  Cameren is calling due to her therapist requesting she call to schedule an appointment due to her SOB occurring more frequently recently. She states her therapist is also concerned that her HR is averaging at 10 beats per minute. She states she is also having dizziness when she stands up because her BP drops, but this has been an ongoing occurrence and is the reason behind her no longer driving. Leilany is scheduled to come in to see Denyse Amass on Friday, 12/25/20 due to this. Please advise.

## 2020-12-24 ENCOUNTER — Ambulatory Visit: Payer: Medicare Other | Admitting: Orthopaedic Surgery

## 2020-12-24 DIAGNOSIS — M9702XD Periprosthetic fracture around internal prosthetic left hip joint, subsequent encounter: Secondary | ICD-10-CM | POA: Diagnosis not present

## 2020-12-24 DIAGNOSIS — R29898 Other symptoms and signs involving the musculoskeletal system: Secondary | ICD-10-CM | POA: Diagnosis not present

## 2020-12-24 DIAGNOSIS — M6281 Muscle weakness (generalized): Secondary | ICD-10-CM | POA: Diagnosis not present

## 2020-12-24 DIAGNOSIS — S72402D Unspecified fracture of lower end of left femur, subsequent encounter for closed fracture with routine healing: Secondary | ICD-10-CM | POA: Diagnosis not present

## 2020-12-24 DIAGNOSIS — R2681 Unsteadiness on feet: Secondary | ICD-10-CM | POA: Diagnosis not present

## 2020-12-24 NOTE — Progress Notes (Signed)
Cardiology Clinic Note   Patient Name: Leah Holland Date of Encounter: 12/25/2020  Primary Care Provider:  [name], MD Primary Cardiologist:  Quay Burow, MD  Patient Profile    Leah Holland 87 year old female presents the clinic today for follow-up evaluation of her paroxysmal atrial fibrillation, and hypertension.  Past Medical History    Past Medical History:  Diagnosis Date  . Anxiety attack    "take RX prn" (08/13/2013)  . Arthritis    "probably" (08/13/2013)  . Atrial fibrillation ([place])    new onset/pt report 08/13/2013  . Bronchitis 12/2011  . Complication of anesthesia   . Depression   . Diabetes mellitus type 2, diet-controlled ([place])    denies  . Femur fracture (HCC)    left proximal femur non-union  . GERD (gastroesophageal reflux disease)   . H/O hiatal hernia   . Hardware complicating wound infection ([place]) 08/22/2016  . History of blood transfusion    "w/both knee OR's" (08/13/2013)  . Hyperlipidemia   . Hyperlipidemia   . Hypertension   . Hypertensive urgency 10/22/2018  . Hypothyroidism    takes synthroid  . Insomnia   . Intertrochanteric fracture of right hip ([place]) 07/30/2015  . Neuromuscular disorder ([place])    carpal tunnel in right hand  . Periprosthetic fracture around internal prosthetic right hip joint ([place]), nonunion basicervical femoral neck with broken hardware and subtrochanteric fracture 03/16/2016  . PONV (postoperative nausea and vomiting)   . Prosthetic hip infection ([place]) 08/22/2016  . Proteus infection 08/22/2016  . Serratia infection 11/14/2016  . Skin cancer of face 2000's   "one on each side of my face and one on my nose" (08/13/2013)  . Swelling of extremity    sees Dr. Gwenlyn Found, cardiologist 1 x year [phone]  . Tendon dysfunction 08/22/2016  . Wears hearing aid    [facility]   Past Surgical History:  Procedure Laterality Date  . 2D ECHOCARDIOGRAM  12/07/2004   EF 48%, moderate pulmonary hypertension  . BREAST BIOPSY Left  ?1970's  . CARDIOVASCULAR STRESS TEST  12/07/11  . CARDIOVASCULAR STRESS TEST  12/07/2011   LEXISCAN, normal scan, no significant wall abnormalities noted  . CARPAL TUNNEL RELEASE Right ~ 2005  . CATARACT EXTRACTION, BILATERAL Bilateral 2000  . DILATION AND CURETTAGE OF UTERUS  1950's  . DOPPLER ECHOCARDIOGRAPHY  12/07/11  . EYE SURGERY    . FEMUR IM NAIL Right 08/01/2015   Procedure: INTRAMEDULLARY (IM) NAIL FEMORAL;  Surgeon: Marchia Bond, MD;  Location: [facility];  Service: Orthopedics;  Laterality: Right;  . HARDWARE REMOVAL Right 03/16/2016   Procedure: HARDWARE REMOVAL;  Surgeon: Marchia Bond, MD;  Location: [place];  Service: Orthopedics;  Laterality: Right;  . INTRAMEDULLARY (IM) NAIL INTERTROCHANTERIC Left 01/26/2017   Procedure: INTRAMEDULLARY (IM) NAIL INTERTROCHANTRIC;  Surgeon: Leandrew Koyanagi, MD;  Location: [place];  Service: Orthopedics;  Laterality: Left;  . JOINT REPLACEMENT     bilateral knee replacement  . SKIN CANCER EXCISION  2000's   "off my nose" (08/13/2013)  . TOTAL HIP ARTHROPLASTY Right 03/16/2016   Procedure: TOTAL HIP ARTHROPLASTY;  Surgeon: Marchia Bond, MD;  Location: [facility];  Service: Orthopedics;  Laterality: Right;  . TOTAL HIP ARTHROPLASTY Left 06/09/2017   Procedure: REMOVE LEFT HIP INTRAMEDULLARY NAIL AND CONVERT TO LEFT CEMENTED HEMI HIP ARTHROPLASTY;  Surgeon: Leandrew Koyanagi, MD;  Location: [facility];  Service: Orthopedics;  Laterality: Left;  . TOTAL KNEE ARTHROPLASTY Right 2005  . TOTAL KNEE ARTHROPLASTY  03/02/2012   Procedure: TOTAL KNEE ARTHROPLASTY;  Surgeon: Yvette Rack., MD;  Location: [facility];  Service: Orthopedics;  Laterality: Left;    Allergies  Allergies  Allergen Reactions  . Levaquin [Levofloxacin] Other (See Comments)    tendinopathy  . Morphine And Related Other (See Comments)    Made pt "wild"    History of Present Illness    Leah Holland has a PMH of hypertension, PAF on Eliquis, GERD, hypothyroidism, diabetes mellitus type 2,  hyperlipidemia, bipolar disorder, right carotid bruit, and hypokalemia.  She was noted to have new onset atrial fibrillation with RVR in 2014.  She was evaluated in the emergency room 08/03/2013 for chest pain.  She was diagnosed with GERD and a UTI.  At that time she had an EKG which shows normal sinus rhythm rate of 79 bpm with PACs.  She had previously reported dizziness, feeling tired and heart racing.  During her A. fib event she was admitted started on IV diltiazem and Eliquis 2.5 mg twice daily.  She converted to normal sinus rhythm and was transitioned to p.o. Cardizem 120 mg daily.  Her furosemide was decreased to 20 mg daily and 10 mill equivalents of potassium.  She also has history of recurrent right and left hip fractures requiring surgical intervention.  With each fracture she has recuperated well and continue to walk with use of a walker.  [name] felt that she may have an element of orthostatic hypotension which led to her falls in the past.  It was recommended that she change positions slowly.  She was last seen by Dr. Gwenlyn Found on 02/12/2020.  During that time she remained stable.  She denied chest pain or shortness of breath.   She contacted nurse triage line on 12/23/2020.  She reported that since she had broken her leg she noticed increased shortness of breath.  Her therapist was concerned with an elevation 10 bpm with the patient moving from sitting to standing.  She denies changes to blood pressure or dizziness.  She presents to the clinic today for follow-up evaluation and states with increased physical activity she has noticed faster heart rates.  EKG today shows atrial fibrillation 55 bpm.  She has also noticed increased shortness of breath over the last 2 months.  This is both with increased physical activity and with normal physical activities.  She is a resident at [facility] and is accompanied today by [name].  She denies  chest pain, lower extremity edema, and dizziness with these episodes.  I will order an echocardiogram, give her the salty 6 diet sheet, have her maintain her physical activity and have her follow-up with Dr. Gwenlyn Found in 3 months.  Today she denies denies chest pain, shortness of breath, lower extremity edema, fatigue, palpitations, melena, hematuria, hemoptysis, diaphoresis, weakness, presyncope, syncope, orthopnea, and PND.   Home Medications    Prior to Admission medications   Medication Sig Start Date End Date Taking? Authorizing Provider  acetaminophen (TYLENOL) 325 MG tablet Take 650 mg by mouth every 6 (six) hours as needed for moderate pain.     [provider]  chlorthalidone (HYGROTON) 25 MG tablet TAKE 1 TABLET ONCE DAILY. Patient taking differently: Take 25 mg by mouth daily.  08/06/20   Lorretta Harp, MD  Cholecalciferol 2000 units CAPS Take 2,000 Units by mouth every evening.    [provider]  denosumab (PROLIA) 60 MG/ML SOLN injection  Inject 60 mg into the skin every 6 (six) months. Administer in upper arm, thigh, or abdomen    [provider]  diltiazem (CARDIZEM CD) 180 MG 24 hr capsule TAKE 1 CAPSULE DAILY. Patient taking differently: Take 180 mg by mouth daily.  06/03/20   Lorretta Harp, MD  DULoxetine (CYMBALTA) 60 MG capsule Take 60 mg by mouth daily.     [provider]  ELIQUIS 2.5 MG TABS tablet TAKE 1 TABLET BY MOUTH TWICE DAILY. Patient taking differently: Take 2.5 mg by mouth 2 (two) times daily.  07/23/20   Lorretta Harp, MD  esomeprazole (NEXIUM) 40 MG capsule Take 40 mg by mouth daily at 12 noon.    [provider]  Flaxseed, Linseed, (FLAXSEED OIL) 1000 MG CAPS Take 1,000 mg by mouth 2 (two) times daily.     [provider]  furosemide (LASIX) 20 MG tablet Take 20 mg by mouth daily as needed.     [provider]  levothyroxine (SYNTHROID) 112 MCG tablet Take 112 mcg by mouth daily before  breakfast.     [provider]  magnesium hydroxide (MILK OF MAGNESIA) 400 MG/5ML suspension Take 30 mLs by mouth every other day.    [provider]  metoprolol (LOPRESSOR) 50 MG tablet Take 50 mg by mouth 2 (two) times daily.    [provider]  Multiple Vitamins-Minerals (PRESERVISION AREDS 2+MULTI VIT PO) Take by mouth 2 (two) times daily. 202-54-27-0 mg capsule    [provider]  potassium chloride SA (KLOR-CON) 20 MEQ tablet Take 20 mEq by mouth daily as needed.    [provider]  saccharomyces boulardii (FLORASTOR) 250 MG capsule Take 250 mg by mouth daily.    [provider]  senna (SENOKOT) 8.6 MG tablet Take 2 tablets by mouth at bedtime.    [provider]  sulfamethoxazole-trimethoprim (BACTRIM) 400-80 MG tablet TAKE 1 TABLET BY MOUTH TWICE DAILY. 12/17/20   Truman Hayward, MD  temazepam (RESTORIL) 30 MG capsule Take 1 capsule (30 mg total) by mouth at bedtime as needed for sleep. 10/20/20   Virgie Dad, MD    Family History    Family History  Problem Relation Age of Onset  . Arrhythmia Mother   . Heart disease Mother   . Arrhythmia Sister   . Stroke Father 75  . Heart disease Brother   . Heart disease Brother   . Cancer Brother        Colon cancer  . Arrhythmia Brother   . Heart disease Brother   . Heart disease Sister   . Heart disease Sister   . Stroke Sister   . Diabetes Sister   . Heart disease Child 64  . Heart disease Child 42   She indicated that her mother is deceased. She indicated that her father is deceased. She indicated that one of her four sisters is deceased. She indicated that three of her four brothers are deceased. She indicated that both of her sons are alive. She indicated that one of her three children is deceased.  Social History    Social History   Socioeconomic History  . Marital status: Widowed    Spouse name: Not on file  . Number of children: Not on file  . Years  of education: Not on file  . Highest education level: Not on file  Occupational History  . Not on file  Tobacco Use  . Smoking status: Never Smoker  .  Smokeless tobacco: Never Used  Substance and Sexual Activity  . Alcohol use: No  . Drug use: No  . Sexual activity: Never  Other Topics Concern  . Not on file  Social History Narrative   Widowed. Lives at [facility]. Son died in the [place] War and she has not slept well since then.   Admitted to [facility] 01/30/17   Never smoked   Alcohol none   Full Code   Social Determinants of Health   Financial Resource Strain: Not on file  Food Insecurity: Not on file  Transportation Needs: Not on file  Physical Activity: Not on file  Stress: Not on file  Social Connections: Not on file  Intimate Partner Violence: Not on file     Review of Systems    General:  No chills, fever, night sweats or weight changes.  Cardiovascular:  No chest pain, dyspnea on exertion, edema, orthopnea, palpitations, paroxysmal nocturnal dyspnea. Dermatological: No rash, lesions/masses Respiratory: No cough, dyspnea Urologic: No hematuria, dysuria Abdominal:   No nausea, vomiting, diarrhea, bright red blood per rectum, melena, or hematemesis Neurologic:  No visual changes, wkns, changes in mental status. All other systems reviewed and are otherwise negative except as noted above.  Physical Exam    VS:  BP 120/70 (BP Location: Left Arm, Patient Position: Sitting)   Pulse (!) 55   Ht 5\' 3"  (1.6 m)   Wt 134 lb (60.8 kg)   SpO2 96%   BMI 23.74 kg/m  , BMI Body mass index is 23.74 kg/m. GEN: Well nourished, well developed, in no acute distress. HEENT: normal. Neck: Supple, no JVD, carotid bruits, or masses. Cardiac: RRR, no murmurs, rubs, or gallops. No clubbing, cyanosis, edema.  Radials/DP/PT 2+ and equal bilaterally.  Respiratory:  Respirations regular and unlabored, clear to auscultation bilaterally. GI: Soft, nontender,  nondistended, BS + x 4. MS: no deformity or atrophy. Skin: warm and dry, no rash. Neuro:  Strength and sensation are intact. Psych: Normal affect.  Accessory Clinical Findings    Recent Labs: 10/02/2020: TSH 1.197 10/06/2020: B Natriuretic Peptide 139.0; Magnesium 1.6 10/15/2020: ALT 11; Hemoglobin 13.6; Platelets 383 10/29/2020: BUN 18; Creatinine 1.0; Potassium 3.7; Sodium 134   Recent Lipid Panel    Component Value Date/Time   CHOL 116 04/26/2013 2059   TRIG 56 04/26/2013 2059   HDL 45 04/26/2013 2059   CHOLHDL 2.6 04/26/2013 2059   VLDL 11 04/26/2013 2059   [place] 60 04/26/2013 2059    ECG personally reviewed by me today-atrial fibrillation with slow ventricular response inferior infarct undetermined age, anterior septal infarct undetermined age 87 bpm- No acute changes  EKG 10/02/2020 Atrial fibrillation septal infarct undetermined age 26 bpm   Assessment & Plan   1.  DOE- has noticed increased shortness of breath with physical activity.  This has been present for the last 2 months.  She denies weight gain, lower extremity edema, and high salt diet. Order echocardiogram Heart healthy low-sodium diet Maintain physical activity  Paroxysmal atrial fibrillation- EKG today shows atrial fibrillation with slow ventricular response posterior infarct undetermined age anterior septal infarct undetermined age 67 bpm.  Prior history of atrial fibrillation with controlled ventricular response.  Contacted nurse triage line 12/23/2020 after working with therapist who reported a fluctuation in her heart rate when moving positions from sitting to standing. Continue apixaban metoprolol, diltiazem Avoid triggers caffeine, chocolate, EtOH, dehydration etc. Heart healthy low-sodium diet-salty 6 given Increase physical activity as tolerated Change positions slowly, pause  before initiating physical activity  Essential hypertension-BP today 120/70.  Well-controlled at home Continue  metoprolol, diltiazem Heart healthy low-sodium diet-salty 6 given Increase physical activity as tolerated   Hyperlipidemia- LDL 129 on 7/20 4 healthy low-sodium high-fiber diet Increase physical activity as tolerated  Disposition: Follow-up with Dr. Gwenlyn Found in 6-9 months   Jossie Ng. Jenson Beedle NP-C    12/25/2020, 11:02 AM [place] [address] Office [phone] Fax [phone]  Notice: This dictation was prepared with Dragon dictation along with smaller phrase technology. Any transcriptional errors that result from this process are unintentional and may not be corrected upon review.  I spent 15 minutes examining this patient, reviewing medications, and using patient centered shared decision making involving her cardiac care.  Prior to her visit I spent greater than 20 minutes reviewing her past medical history,  medications, and prior cardiac tests.

## 2020-12-25 ENCOUNTER — Ambulatory Visit (INDEPENDENT_AMBULATORY_CARE_PROVIDER_SITE_OTHER): Payer: Medicare Other | Admitting: General Practice

## 2020-12-25 ENCOUNTER — Encounter: Payer: Self-pay | Admitting: General Practice

## 2020-12-25 ENCOUNTER — Other Ambulatory Visit: Payer: Self-pay

## 2020-12-25 VITALS — BP 120/70 | HR 55 | Ht 63.0 in | Wt 134.0 lb

## 2020-12-25 DIAGNOSIS — R0609 Other forms of dyspnea: Secondary | ICD-10-CM

## 2020-12-25 DIAGNOSIS — I10 Essential (primary) hypertension: Secondary | ICD-10-CM | POA: Diagnosis not present

## 2020-12-25 DIAGNOSIS — R0602 Shortness of breath: Secondary | ICD-10-CM

## 2020-12-25 DIAGNOSIS — I480 Paroxysmal atrial fibrillation: Secondary | ICD-10-CM | POA: Diagnosis not present

## 2020-12-25 DIAGNOSIS — E782 Mixed hyperlipidemia: Secondary | ICD-10-CM | POA: Diagnosis not present

## 2020-12-25 DIAGNOSIS — R0600 Dyspnea, unspecified: Secondary | ICD-10-CM | POA: Diagnosis not present

## 2020-12-25 NOTE — Patient Instructions (Signed)
Medication Instructions:  The current medical regimen is effective;  continue present plan and medications as directed. Please refer to the Current Medication list given to you today.  *If you need a refill on your cardiac medications before your next appointment, please call your pharmacy*  Testing/Procedures: Echocardiogram - Your physician has requested that you have an echocardiogram. Echocardiography is a painless test that uses sound waves to create images of your heart. It provides your doctor with information about the size and shape of your heart and how well your heart's chambers and valves are working. This procedure takes approximately one hour. There are no restrictions for this procedure. This will be performed at our [facility] location - [address], [address].  Special Instructions CHANGE POSITIONS SLOWLY  Please try to avoid these triggers:  Do not use any products that have nicotine or tobacco in them. These include cigarettes, e-cigarettes, and chewing tobacco. If you need help quitting, ask your doctor.  Eat heart-healthy foods. Talk with your doctor about the right eating plan for you.  Exercise regularly as told by your doctor.  Stay hydrated  Do not drink alcohol, Caffeine or chocolate.  Lose weight if you are overweight.  Do not use drugs, including cannabis   PLEASE READ AND FOLLOW SALTY 6-ATTACHED-1,800 mg daily  PLEASE MAINTAIN PHYSICAL ACTIVITY AS TOLERATED  Follow-Up: Your next appointment:  KEEP SCHEDULED APPOINTMENT  with Quay Burow, MD   At [facility], you and your health needs are our priority.  As part of our continuing mission to provide you with exceptional heart care, we have created designated Provider Care Teams.  These Care Teams include your primary Cardiologist (physician) and Advanced Practice Providers (APPs -  Physician Assistants and Nurse Practitioners) who all work together to provide you with the care you need, when you  need it.

## 2020-12-29 ENCOUNTER — Ambulatory Visit: Payer: Medicare Other | Admitting: Orthopaedic Surgery

## 2020-12-29 DIAGNOSIS — R29898 Other symptoms and signs involving the musculoskeletal system: Secondary | ICD-10-CM | POA: Diagnosis not present

## 2020-12-29 DIAGNOSIS — R2681 Unsteadiness on feet: Secondary | ICD-10-CM | POA: Diagnosis not present

## 2020-12-29 DIAGNOSIS — M9702XD Periprosthetic fracture around internal prosthetic left hip joint, subsequent encounter: Secondary | ICD-10-CM | POA: Diagnosis not present

## 2020-12-29 DIAGNOSIS — S72402D Unspecified fracture of lower end of left femur, subsequent encounter for closed fracture with routine healing: Secondary | ICD-10-CM | POA: Diagnosis not present

## 2020-12-29 DIAGNOSIS — M6281 Muscle weakness (generalized): Secondary | ICD-10-CM | POA: Diagnosis not present

## 2020-12-31 DIAGNOSIS — S72402D Unspecified fracture of lower end of left femur, subsequent encounter for closed fracture with routine healing: Secondary | ICD-10-CM | POA: Diagnosis not present

## 2020-12-31 DIAGNOSIS — R29898 Other symptoms and signs involving the musculoskeletal system: Secondary | ICD-10-CM | POA: Diagnosis not present

## 2020-12-31 DIAGNOSIS — M9702XD Periprosthetic fracture around internal prosthetic left hip joint, subsequent encounter: Secondary | ICD-10-CM | POA: Diagnosis not present

## 2020-12-31 DIAGNOSIS — R2681 Unsteadiness on feet: Secondary | ICD-10-CM | POA: Diagnosis not present

## 2020-12-31 DIAGNOSIS — M6281 Muscle weakness (generalized): Secondary | ICD-10-CM | POA: Diagnosis not present

## 2021-01-05 ENCOUNTER — Ambulatory Visit (INDEPENDENT_AMBULATORY_CARE_PROVIDER_SITE_OTHER): Payer: Medicare Other | Admitting: Orthopaedic Surgery

## 2021-01-05 DIAGNOSIS — S72402D Unspecified fracture of lower end of left femur, subsequent encounter for closed fracture with routine healing: Secondary | ICD-10-CM | POA: Diagnosis not present

## 2021-01-05 DIAGNOSIS — M9702XD Periprosthetic fracture around internal prosthetic left hip joint, subsequent encounter: Secondary | ICD-10-CM

## 2021-01-05 DIAGNOSIS — R29898 Other symptoms and signs involving the musculoskeletal system: Secondary | ICD-10-CM | POA: Diagnosis not present

## 2021-01-05 DIAGNOSIS — M6281 Muscle weakness (generalized): Secondary | ICD-10-CM | POA: Diagnosis not present

## 2021-01-05 DIAGNOSIS — R2681 Unsteadiness on feet: Secondary | ICD-10-CM | POA: Diagnosis not present

## 2021-01-05 NOTE — Progress Notes (Signed)
Office Visit Note   Patient: Leah Holland           Date of Birth: 08/15/1922           MRN: [id number] Visit Date: 01/05/2021              Requested by: Leah Shanks, MD [place],  [place] [id number] PCP: Leah Shanks, MD   Assessment & Plan: Visit Diagnoses:  1. Periprosthetic fracture around internal prosthetic left hip joint, subsequent encounter     Plan: Impression is right distal quadriceps overuse and tendinitis. This is likely compensation for the fact that her left leg 100% periprosthetic fracture. I have suggested relative rest and physical therapy and strengthening and symptomatic treatment. I am confident that this will resolve eventually. We will see her back as needed.  Follow-Up Instructions: Return if symptoms worsen or fail to improve.   Orders:  No orders of the defined types were placed in this encounter.  No orders of the defined types were placed in this encounter.     Procedures: No procedures performed   Clinical Data: No additional findings.   Subjective: Chief Complaint  Patient presents with  . Post-op Follow-up    Leah Holland returns today for follow-up of periprosthetic fracture and right knee pain. She has been doing physical therapy and rehab. The pain is in the distal quadriceps region. She has been using a rolling pin. Her left leg is doing fine.   Review of Systems   Objective: Vital Signs: There were no vitals taken for this visit.  Physical Exam  Ortho Exam Right leg exam shows moderate discomfort with palpation at the distal quadriceps near the myotendinous junction both medially and laterally. She has good knee extension strength. No joint effusion. Specialty Comments:  No specialty comments available.  Imaging: No results found.   PMFS History: Patient Active Problem List   Diagnosis Date Noted  . Hypophosphatemia 10/07/2020  . Hypomagnesemia 10/07/2020  . Hypokalemia 10/07/2020  .  Periprosthetic fracture around internal prosthetic left hip joint ([place]) 10/03/2020  . Leg fracture, left, closed, initial encounter 10/01/2020  . Hypertensive urgency 10/22/2018  . Chronic low back pain 12/21/2017  . Chronic antibiotic suppression 06/13/2017  . History of hip replacement 06/09/2017  . Ecchymosis 02/23/2017  . Closed intertrochanteric fracture with nonunion, left 01/26/2017  . Leukocytosis 01/26/2017  . Fall 01/26/2017  . Prosthetic hip infection ([place]) 08/22/2016  . Tendinopathy 08/22/2016  . Depression, recurrent ([place]) 04/07/2016  . Arthritis of knee, degenerative 04/07/2016  . OP (osteoporosis) 04/07/2016  . 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 Chronic anticoagulation 08/06/2015  . Bipolar disorder ([place]) 08/04/2015  . Hyponatremia 08/04/2015  . Constipation 08/04/2015  . Complication of anesthesia 07/30/2015  . Diabetes mellitus type 2, diet-controlled ([place])   . Skin cancer of face   . Paroxysmal atrial fibrillation ([place]) 09/01/2014  . Right carotid bruit 11/18/2013  . Postoperative anemia due to acute blood loss 03/03/2012  . Hypertension   . Hyperlipidemia   . Swelling of extremity   . Cataract   . Hypothyroidism   . GERD (gastroesophageal reflux disease)   . Arthritis   . Insomnia    Past Medical History:  Diagnosis Date  . Anxiety attack    "take RX prn" (08/13/2013)  . Arthritis    "probably" (08/13/2013)  . Atrial fibrillation ([place])    new onset/pt report 08/13/2013  . Bronchitis 12/2011  . Complication of anesthesia   .  Depression   . Diabetes mellitus type 2, diet-controlled ([place])    denies  . Femur fracture (H[facility])    left proximal femur non-union  . GERD (gastroesophageal reflux disease)   . H/O hiatal hernia   . Hardware complicating wound infection ([place]) 08/22/2016  . History of blood transfusion    "w/both knee OR's" (08/13/2013)  . Hyperlipidemia   . Hyperlipidemia   . Hypertension   . Hypertensive urgency 10/22/2018  .  Hypothyroidism    takes synthroid  . Insomnia   . Intertrochanteric fracture of right hip ([place]) 07/30/2015  . Neuromuscular disorder ([place])    carpal tunnel in right hand  . Periprosthetic fracture around internal prosthetic right hip joint ([place]), nonunion basicervical femoral neck with broken hardware and subtrochanteric fracture 03/16/2016  . PONV (postoperative nausea and vomiting)   . Prosthetic hip infection ([place]) 08/22/2016  . Proteus infection 08/22/2016  . Serratia infection 11/14/2016  . Skin cancer of face 2000's   "one on each side of my face and one on my nose" (08/13/2013)  . Swelling of extremity    sees Dr. Gwenlyn Found, cardiologist 1 x year [phone]  . Tendon dysfunction 08/22/2016  . Wears hearing aid    HOH    Family History  Problem Relation Age of Onset  . Arrhythmia Mother   . Heart disease Mother   . Arrhythmia Sister   . Stroke Father 75  . Heart disease Brother   . Heart disease Brother   . Cancer Brother        Colon cancer  . Arrhythmia Brother   . Heart disease Brother   . Heart disease Sister   . Heart disease Sister   . Stroke Sister   . Diabetes Sister   . Heart disease Child 12  . Heart disease Child 62    Past Surgical History:  Procedure Laterality Date  . 2D ECHOCARDIOGRAM  12/07/2004   EF 48%, moderate pulmonary hypertension  . BREAST BIOPSY Left ?1970's  . CARDIOVASCULAR STRESS TEST  12/07/11  . CARDIOVASCULAR STRESS TEST  12/07/2011   LEXISCAN, normal scan, no significant wall abnormalities noted  . CARPAL TUNNEL RELEASE Right ~ 2005  . CATARACT EXTRACTION, BILATERAL Bilateral 2000  . DILATION AND CURETTAGE OF UTERUS  1950's  . DOPPLER ECHOCARDIOGRAPHY  12/07/11  . EYE SURGERY    . FEMUR IM NAIL Right 08/01/2015   Procedure: INTRAMEDULLARY (IM) NAIL FEMORAL;  Surgeon: Marchia Bond, MD;  Location: [facility];  Service: Orthopedics;  Laterality: Right;  . HARDWARE REMOVAL Right 03/16/2016   Procedure: HARDWARE REMOVAL;  Surgeon: Marchia Bond, MD;   Location: [facility];  Service: Orthopedics;  Laterality: Right;  . INTRAMEDULLARY (IM) NAIL INTERTROCHANTERIC Left 01/26/2017   Procedure: INTRAMEDULLARY (IM) NAIL INTERTROCHANTRIC;  Surgeon: Leandrew Koyanagi, MD;  Location: [facility];  Service: Orthopedics;  Laterality: Left;  . JOINT REPLACEMENT     bilateral knee replacement  . SKIN CANCER EXCISION  2000's   "off my nose" (08/13/2013)  . TOTAL HIP ARTHROPLASTY Right 03/16/2016   Procedure: TOTAL HIP ARTHROPLASTY;  Surgeon: Marchia Bond, MD;  Location: [facility];  Service: Orthopedics;  Laterality: Right;  . TOTAL HIP ARTHROPLASTY Left 06/09/2017   Procedure: REMOVE LEFT HIP INTRAMEDULLARY NAIL AND CONVERT TO LEFT CEMENTED HEMI HIP ARTHROPLASTY;  Surgeon: Leandrew Koyanagi, MD;  Location: [facility];  Service: Orthopedics;  Laterality: Left;  . TOTAL KNEE ARTHROPLASTY Right 2005  . TOTAL KNEE ARTHROPLASTY  03/02/2012   Procedure: TOTAL KNEE ARTHROPLASTY;  Surgeon: Yvette Rack., MD;  Location: [facility];  Service: Orthopedics;  Laterality: Left;   Social History   Occupational History  . Not on file  Tobacco Use  . Smoking status: Never Smoker  . Smokeless tobacco: Never Used  Substance and Sexual Activity  . Alcohol use: No  . Drug use: No  . Sexual activity: Never

## 2021-01-08 DIAGNOSIS — S72402D Unspecified fracture of lower end of left femur, subsequent encounter for closed fracture with routine healing: Secondary | ICD-10-CM | POA: Diagnosis not present

## 2021-01-08 DIAGNOSIS — R29898 Other symptoms and signs involving the musculoskeletal system: Secondary | ICD-10-CM | POA: Diagnosis not present

## 2021-01-08 DIAGNOSIS — R2681 Unsteadiness on feet: Secondary | ICD-10-CM | POA: Diagnosis not present

## 2021-01-08 DIAGNOSIS — M9702XD Periprosthetic fracture around internal prosthetic left hip joint, subsequent encounter: Secondary | ICD-10-CM | POA: Diagnosis not present

## 2021-01-08 DIAGNOSIS — M6281 Muscle weakness (generalized): Secondary | ICD-10-CM | POA: Diagnosis not present

## 2021-01-12 DIAGNOSIS — R2681 Unsteadiness on feet: Secondary | ICD-10-CM | POA: Diagnosis not present

## 2021-01-12 DIAGNOSIS — M6281 Muscle weakness (generalized): Secondary | ICD-10-CM | POA: Diagnosis not present

## 2021-01-12 DIAGNOSIS — S72402D Unspecified fracture of lower end of left femur, subsequent encounter for closed fracture with routine healing: Secondary | ICD-10-CM | POA: Diagnosis not present

## 2021-01-14 ENCOUNTER — Other Ambulatory Visit: Payer: Self-pay | Admitting: Infectious Disease

## 2021-01-14 ENCOUNTER — Ambulatory Visit: Payer: Medicare Other | Admitting: Infectious Disease

## 2021-01-14 DIAGNOSIS — S72402D Unspecified fracture of lower end of left femur, subsequent encounter for closed fracture with routine healing: Secondary | ICD-10-CM | POA: Diagnosis not present

## 2021-01-14 DIAGNOSIS — M6281 Muscle weakness (generalized): Secondary | ICD-10-CM | POA: Diagnosis not present

## 2021-01-14 DIAGNOSIS — R2681 Unsteadiness on feet: Secondary | ICD-10-CM | POA: Diagnosis not present

## 2021-01-18 ENCOUNTER — Other Ambulatory Visit: Payer: Self-pay

## 2021-01-18 ENCOUNTER — Ambulatory Visit (HOSPITAL_COMMUNITY): Payer: Medicare Other | Attending: Cardiovascular Disease

## 2021-01-18 DIAGNOSIS — R0602 Shortness of breath: Secondary | ICD-10-CM | POA: Insufficient documentation

## 2021-01-18 DIAGNOSIS — R0609 Other forms of dyspnea: Secondary | ICD-10-CM

## 2021-01-18 DIAGNOSIS — R0600 Dyspnea, unspecified: Secondary | ICD-10-CM | POA: Insufficient documentation

## 2021-01-18 LAB — ECHOCARDIOGRAM COMPLETE
Area-P 1/2: 3.57 cm2
MV M vel: 5.45 m/s
MV Peak grad: 118.8 mmHg
S' Lateral: 2.2 cm

## 2021-01-19 ENCOUNTER — Ambulatory Visit (INDEPENDENT_AMBULATORY_CARE_PROVIDER_SITE_OTHER): Payer: Medicare Other | Admitting: Infectious Disease

## 2021-01-19 VITALS — BP 163/82 | HR 73 | Wt 130.0 lb

## 2021-01-19 DIAGNOSIS — R2681 Unsteadiness on feet: Secondary | ICD-10-CM | POA: Diagnosis not present

## 2021-01-19 DIAGNOSIS — T8451XA Infection and inflammatory reaction due to internal right hip prosthesis, initial encounter: Secondary | ICD-10-CM

## 2021-01-19 DIAGNOSIS — G4700 Insomnia, unspecified: Secondary | ICD-10-CM

## 2021-01-19 DIAGNOSIS — Z8781 Personal history of (healed) traumatic fracture: Secondary | ICD-10-CM | POA: Diagnosis not present

## 2021-01-19 DIAGNOSIS — A498 Other bacterial infections of unspecified site: Secondary | ICD-10-CM

## 2021-01-19 DIAGNOSIS — E119 Type 2 diabetes mellitus without complications: Secondary | ICD-10-CM

## 2021-01-19 DIAGNOSIS — T847XXD Infection and inflammatory reaction due to other internal orthopedic prosthetic devices, implants and grafts, subsequent encounter: Secondary | ICD-10-CM | POA: Diagnosis not present

## 2021-01-19 DIAGNOSIS — B9689 Other specified bacterial agents as the cause of diseases classified elsewhere: Secondary | ICD-10-CM | POA: Diagnosis not present

## 2021-01-19 DIAGNOSIS — Z96649 Presence of unspecified artificial hip joint: Secondary | ICD-10-CM

## 2021-01-19 DIAGNOSIS — S72402D Unspecified fracture of lower end of left femur, subsequent encounter for closed fracture with routine healing: Secondary | ICD-10-CM | POA: Diagnosis not present

## 2021-01-19 DIAGNOSIS — Z96653 Presence of artificial knee joint, bilateral: Secondary | ICD-10-CM

## 2021-01-19 DIAGNOSIS — Z9889 Other specified postprocedural states: Secondary | ICD-10-CM

## 2021-01-19 DIAGNOSIS — Z7901 Long term (current) use of anticoagulants: Secondary | ICD-10-CM

## 2021-01-19 DIAGNOSIS — M6281 Muscle weakness (generalized): Secondary | ICD-10-CM | POA: Diagnosis not present

## 2021-01-19 MED ORDER — SULFAMETHOXAZOLE-TRIMETHOPRIM 400-80 MG PO TABS: 1.0000 | ORAL_TABLET | Freq: Two times a day (BID) | ORAL | 11 refills | Status: DC

## 2021-01-19 NOTE — Progress Notes (Signed)
Subjective:   Chief complaint:  Follow-up for prosthetic hip infection    [name], female    DOB: [birth year], 87 y.o.   MRN: [id number]  HPI  Leah Holland is a 87 year old lady with admission in April of 2017 with right intertrochanteric proximal femur nonunion who progressed to ultimately break her intramedullary nail and elected for surgical management. She had removal of hardware including intramedullary nail, and treatment of intertrochanteric fracture with prosthesis placement. Dr. [name] got several intra-operative cultures due to concerrn for infection and two separate cultures grew serratia. Patient was placed on oral levaquin after discussion with me and has remained on this some time.. She has done relatively well but recently began to complain of pain behind her prosthetic knees which she believed might be due to toxicity of fluoroquinolone to tendons after she and her son--who is a Pediatrician read about side effects of FQ.   I was  actually skeptical about FQ toxicity here but I DID think she would be better served over the long run if she is to be on chronic suppressive antibiotics NOT being on a fluoroquinolone given risk for Clostridium difficile colitis in particular. We changed her  over to single strength Bactrim and her tendon pain went away immediately. She has had normal creatinine and potassium while on single strength Bactrim.  She then  fractured her opposite hip and underwent IM nail by Dr. Erlinda Hong   She ended up needing a THA on that side as well.   Bilateral knee replacements as well.  Since I last saw her she has now had a periprosthetic fracture of left femur which was treated non operatively with having her be not weight bearing.  She then developed quadriceps tendinitis when she did bear weight because she was bearing it more so on the right.  She is being followed closely by Dr. Erlinda Hong.  She denies having pain at either prosthetic  joint.   Past Medical History:  Diagnosis Date  . Anxiety attack    "take RX prn" (08/13/2013)  . Arthritis    "probably" (08/13/2013)  . Atrial fibrillation ([place])    new onset/pt report 08/13/2013  . Bronchitis 12/2011  . Complication of anesthesia   . Depression   . Diabetes mellitus type 2, diet-controlled ([place])    denies  . Femur fracture (HCC)    left proximal femur non-union  . GERD (gastroesophageal reflux disease)   . H/O hiatal hernia   . Hardware complicating wound infection ([place]) 08/22/2016  . History of blood transfusion    "w/both knee OR's" (08/13/2013)  . Hyperlipidemia   . Hyperlipidemia   . Hypertension   . Hypertensive urgency 10/22/2018  . Hypothyroidism    takes synthroid  . Insomnia   . Intertrochanteric fracture of right hip ([place]) 07/30/2015  . Neuromuscular disorder ([place])    carpal tunnel in right hand  . Periprosthetic fracture around internal prosthetic right hip joint ([place]), nonunion basicervical femoral neck with broken hardware and subtrochanteric fracture 03/16/2016  . PONV (postoperative nausea and vomiting)   . Prosthetic hip infection ([place]) 08/22/2016  . Proteus infection 08/22/2016  . Serratia infection 11/14/2016  . Skin cancer of face 2000's   "one on each side of my face and one on my nose" (08/13/2013)  . Swelling of extremity    sees Dr. Gwenlyn Found, cardiologist 1 x year [phone]  . Tendon dysfunction 08/22/2016  . Wears hearing aid  HOH    Past Surgical History:  Procedure Laterality Date  . 2D ECHOCARDIOGRAM  12/07/2004   EF 48%, moderate pulmonary hypertension  . BREAST BIOPSY Left ?1970's  . CARDIOVASCULAR STRESS TEST  12/07/11  . CARDIOVASCULAR STRESS TEST  12/07/2011   LEXISCAN, normal scan, no significant wall abnormalities noted  . CARPAL TUNNEL RELEASE Right ~ 2005  . CATARACT EXTRACTION, BILATERAL Bilateral 2000  . DILATION AND CURETTAGE OF UTERUS  1950's  . DOPPLER ECHOCARDIOGRAPHY  12/07/11  . EYE SURGERY    . FEMUR IM NAIL  Right 08/01/2015   Procedure: INTRAMEDULLARY (IM) NAIL FEMORAL;  Surgeon: Marchia Bond, MD;  Location: [facility];  Service: Orthopedics;  Laterality: Right;  . HARDWARE REMOVAL Right 03/16/2016   Procedure: HARDWARE REMOVAL;  Surgeon: Marchia Bond, MD;  Location: [facility];  Service: Orthopedics;  Laterality: Right;  . INTRAMEDULLARY (IM) NAIL INTERTROCHANTERIC Left 01/26/2017   Procedure: INTRAMEDULLARY (IM) NAIL INTERTROCHANTRIC;  Surgeon: Leandrew Koyanagi, MD;  Location: [facility];  Service: Orthopedics;  Laterality: Left;  . JOINT REPLACEMENT     bilateral knee replacement  . SKIN CANCER EXCISION  2000's   "off my nose" (08/13/2013)  . TOTAL HIP ARTHROPLASTY Right 03/16/2016   Procedure: TOTAL HIP ARTHROPLASTY;  Surgeon: Marchia Bond, MD;  Location: [facility];  Service: Orthopedics;  Laterality: Right;  . TOTAL HIP ARTHROPLASTY Left 06/09/2017   Procedure: REMOVE LEFT HIP INTRAMEDULLARY NAIL AND CONVERT TO LEFT CEMENTED HEMI HIP ARTHROPLASTY;  Surgeon: Leandrew Koyanagi, MD;  Location: [facility];  Service: Orthopedics;  Laterality: Left;  . TOTAL KNEE ARTHROPLASTY Right 2005  . TOTAL KNEE ARTHROPLASTY  03/02/2012   Procedure: TOTAL KNEE ARTHROPLASTY;  Surgeon: Yvette Rack., MD;  Location: [facility];  Service: Orthopedics;  Laterality: Left;    Family History  Problem Relation Age of Onset  . Arrhythmia Mother   . Heart disease Mother   . Arrhythmia Sister   . Stroke Father 46  . Heart disease Brother   . Heart disease Brother   . Cancer Brother        Colon cancer  . Arrhythmia Brother   . Heart disease Brother   . Heart disease Sister   . Heart disease Sister   . Stroke Sister   . Diabetes Sister   . Heart disease Child 41  . Heart disease Child 70      Social History   Socioeconomic History  . Marital status: Widowed    Spouse name: Not on file  . Number of children: Not on file  . Years of education: Not on file  . Highest education level: Not on file  Occupational History  . Not on file   Tobacco Use  . Smoking status: Never Smoker  . Smokeless tobacco: Never Used  Substance and Sexual Activity  . Alcohol use: No  . Drug use: No  . Sexual activity: Never  Other Topics Concern  . Not on file  Social History Narrative   Widowed. Lives at [facility]. Son died in the [place] War and she has not slept well since then.   Admitted to [facility] 01/30/17   Never smoked   Alcohol none   Full Code   Social Determinants of Health   Financial Resource Strain: Not on file  Food Insecurity: Not on file  Transportation Needs: Not on file  Physical Activity: Not on file  Stress: Not on file  Social Connections: Not on file  Allergies  Allergen Reactions  . Levaquin [Levofloxacin] Other (See Comments)    tendinopathy  . Morphine And Related Other (See Comments)    Made pt "wild"     Current Outpatient Medications:  .  acetaminophen (TYLENOL) 325 MG tablet, Take 650 mg by mouth every 6 (six) hours as needed for moderate pain. , Disp: , Rfl:  .  apixaban (ELIQUIS) 2.5 MG TABS tablet, 2.5 mg., Disp: , Rfl:  .  chlorthalidone (HYGROTON) 25 MG tablet, TAKE 1 TABLET ONCE DAILY. (Patient taking differently: Take 25 mg by mouth daily.), Disp: 90 tablet, Rfl: 1 .  Cholecalciferol 2000 units CAPS, Take 2,000 Units by mouth every evening., Disp: , Rfl:  .  denosumab (PROLIA) 60 MG/ML SOLN injection, Inject 60 mg into the skin every 6 (six) months. Administer in upper arm, thigh, or abdomen, Disp: , Rfl:  .  diltiazem (CARDIZEM CD) 180 MG 24 hr capsule, TAKE 1 CAPSULE DAILY. (Patient taking differently: Take 180 mg by mouth daily.), Disp: 90 capsule, Rfl: 2 .  DULoxetine (CYMBALTA) 60 MG capsule, Take 60 mg by mouth daily. , Disp: , Rfl:  .  ELIQUIS 2.5 MG TABS tablet, TAKE 1 TABLET BY MOUTH TWICE DAILY. (Patient taking differently: Take 2.5 mg by mouth 2 (two) times daily.), Disp: 60 tablet, Rfl: 3 .  esomeprazole (NEXIUM) 20 MG capsule, 1 capsule, Disp: , Rfl:   .  esomeprazole (NEXIUM) 40 MG capsule, Take 40 mg by mouth daily at 12 noon., Disp: , Rfl:  .  Flaxseed, Linseed, (FLAXSEED OIL) 1000 MG CAPS, Take 1,000 mg by mouth 2 (two) times daily. , Disp: , Rfl:  .  Flaxseed, Linseed, (FLAXSEED OIL) 1000 MG CAPS, 100mg  two tablets in pm, Disp: , Rfl:  .  furosemide (LASIX) 20 MG tablet, Take 20 mg by mouth daily as needed. , Disp: , Rfl:  .  levothyroxine (SYNTHROID) 112 MCG tablet, Take 112 mcg by mouth daily before breakfast. , Disp: , Rfl:  .  magnesium hydroxide (MILK OF MAGNESIA) 400 MG/5ML suspension, Take 30 mLs by mouth every other day., Disp: , Rfl:  .  metoprolol (LOPRESSOR) 50 MG tablet, Take 50 mg by mouth 2 (two) times daily., Disp: , Rfl:  .  Multiple Vitamins-Minerals (PRESERVISION AREDS 2+MULTI VIT PO), Take by mouth 2 (two) times daily. 993-57-01-7 mg capsule, Disp: , Rfl:  .  potassium chloride SA (KLOR-CON) 20 MEQ tablet, Take 20 mEq by mouth daily as needed., Disp: , Rfl:  .  saccharomyces boulardii (FLORASTOR) 250 MG capsule, Take 250 mg by mouth daily., Disp: , Rfl:  .  senna (SENOKOT) 8.6 MG tablet, Take 2 tablets by mouth at bedtime., Disp: , Rfl:  .  sulfamethoxazole-trimethoprim (BACTRIM) 400-80 MG tablet, TAKE 1 TABLET BY MOUTH TWICE DAILY., Disp: 60 tablet, Rfl: 0 .  sulfamethoxazole-trimethoprim (BACTRIM) 400-80 MG tablet, 1 tablet, Disp: , Rfl:  .  sulfamethoxazole-trimethoprim (BACTRIM) 400-80 MG tablet, TAKE 1 TABLET BY MOUTH TWICE DAILY., Disp: 60 tablet, Rfl: 0 .  temazepam (RESTORIL) 30 MG capsule, Take 1 capsule (30 mg total) by mouth at bedtime as needed for sleep., Disp: 30 capsule, Rfl: 0   Review of Systems  Constitutional: Negative for activity change, appetite change, chills, diaphoresis and fever.  HENT: Negative for congestion and sore throat.   Respiratory: Negative for cough and wheezing.   Cardiovascular: Negative for leg swelling.  Gastrointestinal: Negative for blood in stool, nausea and vomiting.   Genitourinary: Negative for dysuria, flank pain and hematuria.  Musculoskeletal: Positive for myalgias. Negative for back pain.  Skin: Negative for rash.  Neurological: Negative for weakness and headaches.  Hematological: Does not bruise/bleed easily.  Psychiatric/Behavioral: The patient is not nervous/anxious.        Objective:   Physical Exam Constitutional:      General: She is not in acute distress.    Appearance: She is well-developed. She is not diaphoretic.  HENT:     Head: Normocephalic and atraumatic.     Mouth/Throat:     Pharynx: No oropharyngeal exudate.  Eyes:     General: No scleral icterus.    Conjunctiva/sclera: Conjunctivae normal.  Cardiovascular:     Rate and Rhythm: Normal rate and regular rhythm.  Pulmonary:     Effort: Pulmonary effort is normal. No respiratory distress.     Breath sounds: No wheezing.  Abdominal:     General: There is no distension.  Musculoskeletal:     Cervical back: Normal range of motion and neck supple.     Left knee: No swelling. No tenderness.  Skin:    General: Skin is warm and dry.     Findings: No erythema or rash.  Neurological:     General: No focal deficit present.     Mental Status: She is alert and oriented to person, place, and time.     Motor: No abnormal muscle tone.     Coordination: Coordination normal.  Psychiatric:        Attention and Perception: Attention normal.        Mood and Affect: Mood normal.        Behavior: Behavior normal.        Judgment: Judgment normal.           Assessment & Plan:    Prosthetic hip infection:   Check labs today  Continue Bactrim SS BID if labs OK and RTC in one year  Hip fracture: followed closely by Dr. Erlinda Hong

## 2021-01-20 LAB — CBC WITH DIFFERENTIAL/PLATELET
Absolute Monocytes: 825 cells/uL (ref 200–950)
Basophils Absolute: 51 cells/uL (ref 0–200)
Basophils Relative: 0.6 %
Eosinophils Absolute: 170 cells/uL (ref 15–500)
Eosinophils Relative: 2 %
HCT: 42.7 % (ref 35.0–45.0)
Hemoglobin: 14.3 g/dL (ref 11.7–15.5)
Lymphs Abs: 2329 cells/uL (ref 850–3900)
MCH: 30.3 pg (ref 27.0–33.0)
MCHC: 33.5 g/dL (ref 32.0–36.0)
MCV: 90.5 fL (ref 80.0–100.0)
MPV: 10.5 fL (ref 7.5–12.5)
Monocytes Relative: 9.7 %
Neutro Abs: 5126 cells/uL (ref 1500–7800)
Neutrophils Relative %: 60.3 %
Platelets: 281 10*3/uL (ref 140–400)
RBC: 4.72 10*6/uL (ref 3.80–5.10)
RDW: 12.8 % (ref 11.0–15.0)
Total Lymphocyte: 27.4 %
WBC: 8.5 10*3/uL (ref 3.8–10.8)

## 2021-01-20 LAB — COMPLETE METABOLIC PANEL WITH GFR
AG Ratio: 1.6 (calc) (ref 1.0–2.5)
ALT: 17 U/L (ref 6–29)
AST: 18 U/L (ref 10–35)
Albumin: 3.9 g/dL (ref 3.6–5.1)
Alkaline phosphatase (APISO): 83 U/L (ref 37–153)
BUN/Creatinine Ratio: 18 (calc) (ref 6–22)
BUN: 20 mg/dL (ref 7–25)
CO2: 32 mmol/L (ref 20–32)
Calcium: 10.4 mg/dL (ref 8.6–10.4)
Chloride: 96 mmol/L — ABNORMAL LOW (ref 98–110)
Creat: 1.14 mg/dL — ABNORMAL HIGH (ref 0.60–0.88)
GFR, Est African American: 47 mL/min/{1.73_m2} — ABNORMAL LOW (ref >=60)
GFR, Est Non African American: 41 mL/min/{1.73_m2} — ABNORMAL LOW (ref >=60)
Globulin: 2.5 g/dL (calc) (ref 1.9–3.7)
Glucose, Bld: 92 mg/dL (ref 65–99)
Potassium: 4 mmol/L (ref 3.5–5.3)
Sodium: 136 mmol/L (ref 135–146)
Total Bilirubin: 0.8 mg/dL (ref 0.2–1.2)
Total Protein: 6.4 g/dL (ref 6.1–8.1)

## 2021-01-20 LAB — SEDIMENTATION RATE: Sed Rate: 25 mm/h (ref 0–30)

## 2021-01-20 LAB — C-REACTIVE PROTEIN: CRP: 62.2 mg/L — ABNORMAL HIGH (ref <8.0)

## 2021-01-21 ENCOUNTER — Other Ambulatory Visit: Payer: Self-pay | Admitting: Cardiovascular Disease

## 2021-01-22 DIAGNOSIS — S72402D Unspecified fracture of lower end of left femur, subsequent encounter for closed fracture with routine healing: Secondary | ICD-10-CM | POA: Diagnosis not present

## 2021-01-22 DIAGNOSIS — M6281 Muscle weakness (generalized): Secondary | ICD-10-CM | POA: Diagnosis not present

## 2021-01-22 DIAGNOSIS — R2681 Unsteadiness on feet: Secondary | ICD-10-CM | POA: Diagnosis not present

## 2021-01-25 DIAGNOSIS — S72402D Unspecified fracture of lower end of left femur, subsequent encounter for closed fracture with routine healing: Secondary | ICD-10-CM | POA: Diagnosis not present

## 2021-01-25 DIAGNOSIS — R2681 Unsteadiness on feet: Secondary | ICD-10-CM | POA: Diagnosis not present

## 2021-01-25 DIAGNOSIS — M6281 Muscle weakness (generalized): Secondary | ICD-10-CM | POA: Diagnosis not present

## 2021-01-29 DIAGNOSIS — M6281 Muscle weakness (generalized): Secondary | ICD-10-CM | POA: Diagnosis not present

## 2021-01-29 DIAGNOSIS — S72402D Unspecified fracture of lower end of left femur, subsequent encounter for closed fracture with routine healing: Secondary | ICD-10-CM | POA: Diagnosis not present

## 2021-01-29 DIAGNOSIS — R2681 Unsteadiness on feet: Secondary | ICD-10-CM | POA: Diagnosis not present

## 2021-02-02 DIAGNOSIS — R2681 Unsteadiness on feet: Secondary | ICD-10-CM | POA: Diagnosis not present

## 2021-02-02 DIAGNOSIS — S72402D Unspecified fracture of lower end of left femur, subsequent encounter for closed fracture with routine healing: Secondary | ICD-10-CM | POA: Diagnosis not present

## 2021-02-02 DIAGNOSIS — M6281 Muscle weakness (generalized): Secondary | ICD-10-CM | POA: Diagnosis not present

## 2021-02-10 DIAGNOSIS — R2681 Unsteadiness on feet: Secondary | ICD-10-CM | POA: Diagnosis not present

## 2021-02-10 DIAGNOSIS — S72402D Unspecified fracture of lower end of left femur, subsequent encounter for closed fracture with routine healing: Secondary | ICD-10-CM | POA: Diagnosis not present

## 2021-02-10 DIAGNOSIS — M6281 Muscle weakness (generalized): Secondary | ICD-10-CM | POA: Diagnosis not present

## 2021-02-12 ENCOUNTER — Encounter: Payer: Self-pay | Admitting: Cardiovascular Disease

## 2021-02-12 ENCOUNTER — Other Ambulatory Visit: Payer: Self-pay

## 2021-02-12 ENCOUNTER — Ambulatory Visit (INDEPENDENT_AMBULATORY_CARE_PROVIDER_SITE_OTHER): Payer: Medicare Other | Admitting: Cardiovascular Disease

## 2021-02-12 VITALS — BP 132/76 | HR 51 | Ht 63.0 in | Wt 133.4 lb

## 2021-02-12 DIAGNOSIS — I480 Paroxysmal atrial fibrillation: Secondary | ICD-10-CM | POA: Diagnosis not present

## 2021-02-12 DIAGNOSIS — E782 Mixed hyperlipidemia: Secondary | ICD-10-CM

## 2021-02-12 DIAGNOSIS — R0609 Other forms of dyspnea: Secondary | ICD-10-CM

## 2021-02-12 DIAGNOSIS — I10 Essential (primary) hypertension: Secondary | ICD-10-CM

## 2021-02-12 DIAGNOSIS — R0600 Dyspnea, unspecified: Secondary | ICD-10-CM | POA: Diagnosis not present

## 2021-02-12 NOTE — Progress Notes (Signed)
02/12/2021 Leah Holland   29-Aug-1922  [id number]  Primary Physician Leah Shanks, MD Primary Cardiologist: Leah Harp MD FACP, [place], [place], [place]  HPI:  Leah Holland is a 87 y.o.  widowed, Caucasian female, mother of two living children (one deceased), grandmother to five grandchildren, who I last saw3/02/2020.Marland Kitchen She is the youngest of 10 children. Her sister, Leah Holland was also placed in mind that has passed away. She had one son who died,one son who is now a retired Lexicographer ,who accompanies her today, in one son Cabin crew in [employer], , is currently retired and who is accompanying her today.. She has a history of hypertension and hyperlipidemia, as well as family history of heart disease. She had a remote right total knee replacement and a recent left one by Dr. French Holland. She had a Myoview stress test performed in our office on August 07, 2011, which was low risk. Recent lab work performed by Dr. Greta Holland revealed a total cholesterol of 133, HDL of 71, and HDL of 39.  Patient presented with apparent new onset atrial fibrillation with RVR. She [facility] on 08/03/2013 for chest pain. She was diagnosed with gastroesophageal reflux disease and a UTI. At that time she was in normal sinus rhythm at a rate of 79 beats per minute and with PACs. She reported dizziness, feeling tired and a racing HR since ~Sat/Sun. She otherwise denied nausea, vomiting, fever, chest pain, dizziness, PND, cough, congestion, abdominal pain, hematochezia, melena, lower extremity edema, claudication. No recent GIB.  She was admitted and started on IV diltiazem 5mg /hr and Eliquis 2.5mg  twice daily. She converted to NSR at 0246hrs. She was changed to PO Cardizem 120mg  daily. She's been taking lasix 60mg  daily for LEE for a long time. This was changed to 20mg  daily as needed a long with 57meq as needed potassium.  Her major issueshad beenwith  recurrent right and left hip fractures requiring surgical intervention. She has recuperated over the last year and is currently walking with a walker.   Her son who is the retired pediatrician feels that she may have an element of orthostatic hypotension which has led to her falls in the past and was recommended that she sit for a while before she stands up.  Since I saw her a year ago she is remained stable.    Her major complaint has been with dyspnea on exertion.  She did fracture her hip earlier this year.  She saw Leah Memos, NP in the office in January who ordered a 2D echo that showed normal LV systolic function, RV dysfunction with increased RV systolic pressure and moderate to severe TR  Current Meds  Medication Sig  . acetaminophen (TYLENOL) 325 MG tablet Take 650 mg by mouth every 6 (six) hours as needed for moderate pain.   Marland Kitchen apixaban (ELIQUIS) 2.5 MG TABS tablet 2.5 mg.  . chlorthalidone (HYGROTON) 25 MG tablet TAKE 1 TABLET ONCE DAILY.  Marland Kitchen Cholecalciferol 2000 units CAPS Take 2,000 Units by mouth every evening.  . denosumab (PROLIA) 60 MG/ML SOLN injection Inject 60 mg into the skin every 6 (six) months. Administer in upper arm, thigh, or abdomen  . diltiazem (CARDIZEM CD) 180 MG 24 hr capsule TAKE 1 CAPSULE DAILY.  . DULoxetine (CYMBALTA) 60 MG capsule Take 60 mg by mouth daily.   Marland Kitchen esomeprazole (NEXIUM) 20 MG capsule 1 capsule  . Flaxseed, Linseed, (FLAXSEED OIL) 1000 MG CAPS  Take 1,000 mg by mouth 2 (two) times daily.   . furosemide (LASIX) 20 MG tablet Take 20 mg by mouth daily as needed.   Marland Kitchen levothyroxine (SYNTHROID) 112 MCG tablet Take 112 mcg by mouth daily before breakfast.   . magnesium hydroxide (MILK OF MAGNESIA) 400 MG/5ML suspension Take 30 mLs by mouth every other day.  . metoprolol (LOPRESSOR) 50 MG tablet Take 50 mg by mouth 2 (two) times daily.  . Multiple Vitamins-Minerals (PRESERVISION AREDS 2+MULTI VIT PO) Take by mouth 2 (two) times daily. 932-67-12-4 mg  capsule  . senna (SENOKOT) 8.6 MG tablet Take 2 tablets by mouth at bedtime.  . sulfamethoxazole-trimethoprim (BACTRIM) 400-80 MG tablet Take 1 tablet by mouth 2 (two) times daily.  . temazepam (RESTORIL) 30 MG capsule Take 1 capsule (30 mg total) by mouth at bedtime as needed for sleep.     Allergies  Allergen Reactions  . Levaquin [Levofloxacin] Other (See Comments)    tendinopathy  . Morphine And Related Other (See Comments)    Made pt "wild"    Social History   Socioeconomic History  . Marital status: Widowed    Spouse name: Not on file  . Number of children: Not on file  . Years of education: Not on file  . Highest education level: Not on file  Occupational History  . Not on file  Tobacco Use  . Smoking status: Never Smoker  . Smokeless tobacco: Never Used  Substance and Sexual Activity  . Alcohol use: No  . Drug use: No  . Sexual activity: Never  Other Topics Concern  . Not on file  Social History Narrative   Widowed. Lives at [facility]. Son died in the [place] War and she has not slept well since then.   Admitted to [facility] 01/30/17   Never smoked   Alcohol none   Full Code   Social Determinants of Health   Financial Resource Strain: Not on file  Food Insecurity: Not on file  Transportation Needs: Not on file  Physical Activity: Not on file  Stress: Not on file  Social Connections: Not on file  Intimate Partner Violence: Not on file     Review of Systems: General: negative for chills, fever, night sweats or weight changes.  Cardiovascular: negative for chest pain, dyspnea on exertion, edema, orthopnea, palpitations, paroxysmal nocturnal dyspnea or shortness of breath Dermatological: negative for rash Respiratory: negative for cough or wheezing Urologic: negative for hematuria Abdominal: negative for nausea, vomiting, diarrhea, bright red blood per rectum, melena, or hematemesis Neurologic: negative for visual changes, syncope, or  dizziness All other systems reviewed and are otherwise negative except as noted above.    Blood pressure 132/76, pulse (!) 51, height 5\' 3"  (1.6 m), weight 133 lb 6.4 oz (60.5 kg), SpO2 95 %.  General appearance: alert and no distress Neck: no adenopathy, no carotid bruit, no JVD, supple, symmetrical, trachea midline and thyroid not enlarged, symmetric, no tenderness/mass/nodules Lungs: clear to auscultation bilaterally Heart: irregularly irregular rhythm Extremities: extremities normal, atraumatic, no cyanosis or edema Pulses: 2+ and symmetric Skin: Skin color, texture, turgor normal. No rashes or lesions Neurologic: Alert and oriented X 3, normal strength and tone. Normal symmetric reflexes. Normal coordination and gait  EKG atrial fibrillation with a ventricular sponsor of 51, low limb voltage, septal Q waves.  I personally reviewed this EKG.  ASSESSMENT AND PLAN:   Hypertension History of essential hypertension a blood pressure measured today at 132/76.  She is  on diltiazem, chlorthalidone, and metoprolol.  Hyperlipidemia History of hyperlipidemia not on statin therapy with lipid profile performed 07/05/2019 revealing total cholesterol 185, LDL 129 and HDL 43.  Paroxysmal atrial fibrillation (HCC) History of PAF currently persistent on Eliquis oral anticoagulation rate controlled with a ventricular sponsor of 51.  Dyspnea on exertion Patient complains of several month history of dyspnea on exertion.  She did have a 2D echo performed 01/18/2021 revealing normal LV systolic function, increased RV size with moderate RV dysfunction and a right ventricular systolic pressure of 49.  She did have moderate to severe TR and mild to moderate MR.  I think her shortness of breath is probably combination of valvular heart disease and chronic A. fib.  I do not think she needs any further work-up given her age.      Leah Harp MD [place], [facility] 02/12/2021 12:26 PM

## 2021-02-12 NOTE — Assessment & Plan Note (Signed)
History of PAF currently persistent on Eliquis oral anticoagulation rate controlled with a ventricular sponsor of 51.

## 2021-02-12 NOTE — Assessment & Plan Note (Signed)
History of hyperlipidemia not on statin therapy with lipid profile performed 07/05/2019 revealing total cholesterol 185, LDL 129 and HDL 43.

## 2021-02-12 NOTE — Assessment & Plan Note (Signed)
Patient complains of several month history of dyspnea on exertion.  She did have a 2D echo performed 01/18/2021 revealing normal LV systolic function, increased RV size with moderate RV dysfunction and a right ventricular systolic pressure of 49.  She did have moderate to severe TR and mild to moderate MR.  I think her shortness of breath is probably combination of valvular heart disease and chronic A. fib.  I do not think she needs any further work-up given her age.

## 2021-02-12 NOTE — Assessment & Plan Note (Signed)
History of essential hypertension a blood pressure measured today at 132/76.  She is on diltiazem, chlorthalidone, and metoprolol.

## 2021-02-12 NOTE — Patient Instructions (Signed)
Medication Instructions:  Your physician recommends that you continue on your current medications as directed. Please refer to the Current Medication list given to you today.  *If you need a refill on your cardiac medications before your next appointment, please call your pharmacy*   Follow-Up: At [facility], you and your health needs are our priority.  As part of our continuing mission to provide you with exceptional heart care, we have created designated Provider Care Teams.  These Care Teams include your primary Cardiologist (physician) and Advanced Practice Providers (APPs -  Physician Assistants and Nurse Practitioners) who all work together to provide you with the care you need, when you need it.  We recommend signing up for the patient portal called "MyChart".  Sign up information is provided on this After Visit Summary.  MyChart is used to connect with patients for Virtual Visits (Telemedicine).  Patients are able to view lab/test results, encounter notes, upcoming appointments, etc.  Non-urgent messages can be sent to your provider as well.   To learn more about what you can do with MyChart, go to [email or web address].com.    Your next appointment:   6 month(s)  The format for your next appointment:   In Person  Provider:   You will see one of the following Advanced Practice Providers on your designated Care Team:    Jesse Cleaver, FNP  Then, Jonathan Berry, MD will plan to see you again in 12 month(s). 

## 2021-02-16 DIAGNOSIS — R2681 Unsteadiness on feet: Secondary | ICD-10-CM | POA: Diagnosis not present

## 2021-02-16 DIAGNOSIS — M6281 Muscle weakness (generalized): Secondary | ICD-10-CM | POA: Diagnosis not present

## 2021-02-16 DIAGNOSIS — S72402D Unspecified fracture of lower end of left femur, subsequent encounter for closed fracture with routine healing: Secondary | ICD-10-CM | POA: Diagnosis not present

## 2021-02-18 DIAGNOSIS — M6281 Muscle weakness (generalized): Secondary | ICD-10-CM | POA: Diagnosis not present

## 2021-02-18 DIAGNOSIS — R2681 Unsteadiness on feet: Secondary | ICD-10-CM | POA: Diagnosis not present

## 2021-02-18 DIAGNOSIS — S72402D Unspecified fracture of lower end of left femur, subsequent encounter for closed fracture with routine healing: Secondary | ICD-10-CM | POA: Diagnosis not present

## 2021-02-24 DIAGNOSIS — H353131 Nonexudative age-related macular degeneration, bilateral, early dry stage: Secondary | ICD-10-CM | POA: Diagnosis not present

## 2021-02-24 DIAGNOSIS — Z961 Presence of intraocular lens: Secondary | ICD-10-CM | POA: Diagnosis not present

## 2021-02-24 DIAGNOSIS — H35372 Puckering of macula, left eye: Secondary | ICD-10-CM | POA: Diagnosis not present

## 2021-02-24 DIAGNOSIS — H04123 Dry eye syndrome of bilateral lacrimal glands: Secondary | ICD-10-CM | POA: Diagnosis not present

## 2021-03-02 DIAGNOSIS — E559 Vitamin D deficiency, unspecified: Secondary | ICD-10-CM | POA: Diagnosis not present

## 2021-03-02 DIAGNOSIS — M810 Age-related osteoporosis without current pathological fracture: Secondary | ICD-10-CM | POA: Diagnosis not present

## 2021-03-02 DIAGNOSIS — R5383 Other fatigue: Secondary | ICD-10-CM | POA: Diagnosis not present

## 2021-03-08 DIAGNOSIS — I4891 Unspecified atrial fibrillation: Secondary | ICD-10-CM | POA: Diagnosis not present

## 2021-03-08 DIAGNOSIS — E039 Hypothyroidism, unspecified: Secondary | ICD-10-CM | POA: Diagnosis not present

## 2021-03-08 DIAGNOSIS — F329 Major depressive disorder, single episode, unspecified: Secondary | ICD-10-CM | POA: Diagnosis not present

## 2021-03-08 DIAGNOSIS — I10 Essential (primary) hypertension: Secondary | ICD-10-CM | POA: Diagnosis not present

## 2021-03-08 DIAGNOSIS — D509 Iron deficiency anemia, unspecified: Secondary | ICD-10-CM | POA: Diagnosis not present

## 2021-03-08 DIAGNOSIS — E785 Hyperlipidemia, unspecified: Secondary | ICD-10-CM | POA: Diagnosis not present

## 2021-03-08 DIAGNOSIS — G4700 Insomnia, unspecified: Secondary | ICD-10-CM | POA: Diagnosis not present

## 2021-03-08 DIAGNOSIS — M810 Age-related osteoporosis without current pathological fracture: Secondary | ICD-10-CM | POA: Diagnosis not present

## 2021-03-09 DIAGNOSIS — E559 Vitamin D deficiency, unspecified: Secondary | ICD-10-CM | POA: Diagnosis not present

## 2021-03-09 DIAGNOSIS — M810 Age-related osteoporosis without current pathological fracture: Secondary | ICD-10-CM | POA: Diagnosis not present

## 2021-04-05 DIAGNOSIS — G4700 Insomnia, unspecified: Secondary | ICD-10-CM | POA: Diagnosis not present

## 2021-04-05 DIAGNOSIS — M810 Age-related osteoporosis without current pathological fracture: Secondary | ICD-10-CM | POA: Diagnosis not present

## 2021-04-05 DIAGNOSIS — F329 Major depressive disorder, single episode, unspecified: Secondary | ICD-10-CM | POA: Diagnosis not present

## 2021-04-05 DIAGNOSIS — I4891 Unspecified atrial fibrillation: Secondary | ICD-10-CM | POA: Diagnosis not present

## 2021-04-05 DIAGNOSIS — E039 Hypothyroidism, unspecified: Secondary | ICD-10-CM | POA: Diagnosis not present

## 2021-04-05 DIAGNOSIS — E785 Hyperlipidemia, unspecified: Secondary | ICD-10-CM | POA: Diagnosis not present

## 2021-04-05 DIAGNOSIS — I10 Essential (primary) hypertension: Secondary | ICD-10-CM | POA: Diagnosis not present

## 2021-04-14 ENCOUNTER — Other Ambulatory Visit: Payer: Self-pay | Admitting: Cardiovascular Disease

## 2021-04-15 NOTE — Telephone Encounter (Signed)
Rx(s) sent to pharmacy electronically.  

## 2021-04-22 ENCOUNTER — Other Ambulatory Visit: Payer: Self-pay | Admitting: Cardiovascular Disease

## 2021-05-11 DIAGNOSIS — Z23 Encounter for immunization: Secondary | ICD-10-CM | POA: Diagnosis not present

## 2021-06-04 DIAGNOSIS — M810 Age-related osteoporosis without current pathological fracture: Secondary | ICD-10-CM | POA: Diagnosis not present

## 2021-06-04 DIAGNOSIS — E039 Hypothyroidism, unspecified: Secondary | ICD-10-CM | POA: Diagnosis not present

## 2021-06-04 DIAGNOSIS — I4891 Unspecified atrial fibrillation: Secondary | ICD-10-CM | POA: Diagnosis not present

## 2021-06-04 DIAGNOSIS — I10 Essential (primary) hypertension: Secondary | ICD-10-CM | POA: Diagnosis not present

## 2021-06-04 DIAGNOSIS — G4700 Insomnia, unspecified: Secondary | ICD-10-CM | POA: Diagnosis not present

## 2021-06-04 DIAGNOSIS — F329 Major depressive disorder, single episode, unspecified: Secondary | ICD-10-CM | POA: Diagnosis not present

## 2021-06-04 DIAGNOSIS — D509 Iron deficiency anemia, unspecified: Secondary | ICD-10-CM | POA: Diagnosis not present

## 2021-06-04 DIAGNOSIS — E785 Hyperlipidemia, unspecified: Secondary | ICD-10-CM | POA: Diagnosis not present

## 2021-06-15 DIAGNOSIS — I4891 Unspecified atrial fibrillation: Secondary | ICD-10-CM | POA: Diagnosis not present

## 2021-06-15 DIAGNOSIS — I10 Essential (primary) hypertension: Secondary | ICD-10-CM | POA: Diagnosis not present

## 2021-06-15 DIAGNOSIS — D509 Iron deficiency anemia, unspecified: Secondary | ICD-10-CM | POA: Diagnosis not present

## 2021-06-15 DIAGNOSIS — M810 Age-related osteoporosis without current pathological fracture: Secondary | ICD-10-CM | POA: Diagnosis not present

## 2021-06-15 DIAGNOSIS — F329 Major depressive disorder, single episode, unspecified: Secondary | ICD-10-CM | POA: Diagnosis not present

## 2021-06-15 DIAGNOSIS — G4700 Insomnia, unspecified: Secondary | ICD-10-CM | POA: Diagnosis not present

## 2021-06-15 DIAGNOSIS — E785 Hyperlipidemia, unspecified: Secondary | ICD-10-CM | POA: Diagnosis not present

## 2021-06-15 DIAGNOSIS — E039 Hypothyroidism, unspecified: Secondary | ICD-10-CM | POA: Diagnosis not present

## 2021-06-25 ENCOUNTER — Telehealth: Payer: Self-pay | Admitting: Obstetrics and Gynecology

## 2021-06-25 ENCOUNTER — Encounter: Payer: Self-pay | Admitting: Obstetrics and Gynecology

## 2021-06-25 ENCOUNTER — Other Ambulatory Visit: Payer: Self-pay

## 2021-06-25 ENCOUNTER — Ambulatory Visit (INDEPENDENT_AMBULATORY_CARE_PROVIDER_SITE_OTHER): Payer: Medicare Other | Admitting: Obstetrics and Gynecology

## 2021-06-25 VITALS — BP 126/80 | HR 78 | Ht 62.0 in | Wt 132.0 lb

## 2021-06-25 DIAGNOSIS — R1900 Intra-abdominal and pelvic swelling, mass and lump, unspecified site: Secondary | ICD-10-CM

## 2021-06-25 DIAGNOSIS — N393 Stress incontinence (female) (male): Secondary | ICD-10-CM | POA: Diagnosis not present

## 2021-06-25 LAB — BASIC METABOLIC PANEL
BUN/Creatinine Ratio: 13 (calc) (ref 6–22)
BUN: 13 mg/dL (ref 7–25)
CO2: 30 mmol/L (ref 20–32)
Calcium: 10.6 mg/dL — ABNORMAL HIGH (ref 8.6–10.4)
Chloride: 96 mmol/L — ABNORMAL LOW (ref 98–110)
Creat: 0.99 mg/dL — ABNORMAL HIGH (ref 0.60–0.95)
Glucose, Bld: 91 mg/dL (ref 65–99)
Potassium: 4.2 mmol/L (ref 3.5–5.3)
Sodium: 135 mmol/L (ref 135–146)

## 2021-06-25 NOTE — Telephone Encounter (Signed)
Please schedule CT of abdomen and pelvis with contract for pelvic mass.  First available location is ok.   Patient had BMP drawn today.   She is not available from 7/26 - 7/29.

## 2021-06-25 NOTE — Telephone Encounter (Signed)
Ct scan scheduled on 07/01/21 @ 11:30am at [facility] will need to pickup contrast 2 days prior to scan.  Left message for patient son Patrick [place] to call on home #.

## 2021-06-25 NOTE — Progress Notes (Signed)
GYNECOLOGY  VISIT   HPI: 87 y.o.   Widowed  Caucasian  female   No obstetric history on file. with No LMP recorded. Patient is postmenopausal.   here for  urinary incontinence for 1 year ago.   Daughter in law present for the visit today.   Leaks with movement getting up and down.  No leak with cough or laugh.  Does leak with sneeze.  No leak for no reason.  Quantity of leakage is large. Uses Depends.   DF - every 1.5 hours. NF - once a night. No enuresis.   No dysuria. No hematuria. Hx UTI once in a while.  Last UTI was 8 hours.  No hx renal stones.   She feels like she has some urinary retention.  Taking Cymbalta   Constipation always.  Senna nightly and uses tea pill.  No fecal incontinence.   No prior pelvic surgery.  Drinks coffee, regular, 2 per day.  Decaff tea for lunch and dinner.  Occasional Sprite.  Takes Bactrim twice daily for prevention of orthopedic infection.   Her umbilical area is painful and red.  She thought she had an infection.  Took Advil and Tylenol for pain.   Son is a retired [employer].   GYNECOLOGIC HISTORY: No LMP recorded. Patient is postmenopausal. Contraception:  none Menopausal hormone therapy:  none Last mammogram: Several yrs ago Last pap smear:   ? yrs        OB History     Gravida  3   Para  3   Term  3   Preterm      AB      Living  2      SAB      IAB      Ectopic      Multiple      Live Births  3              Patient Active Problem List   Diagnosis Date Noted   Dyspnea on exertion 02/12/2021   Hypophosphatemia 10/07/2020   Hypomagnesemia 10/07/2020   Hypokalemia 10/07/2020   Periprosthetic fracture around internal prosthetic left hip joint ([place]) 10/03/2020   Leg fracture, left, closed, initial encounter 10/01/2020   Hypertensive urgency 10/22/2018   Chronic low back pain 12/21/2017   Chronic antibiotic suppression 06/13/2017   History of hip replacement 06/09/2017   Ecchymosis  02/23/2017   Closed intertrochanteric fracture with nonunion, left 01/26/2017   Leukocytosis 01/26/2017   Fall 01/26/2017   Prosthetic hip infection ([place]) 08/22/2016   Tendinopathy 08/22/2016   Depression, recurrent ([place]) 04/07/2016   Arthritis of knee, degenerative 04/07/2016   OP (osteoporosis) 04/07/2016   Agoraphobia with panic disorder 04/07/2016   Chronic anticoagulation 08/06/2015   Bipolar disorder ([place]) 08/04/2015   Hyponatremia 08/04/2015   Constipation 40/98/1191   Complication of anesthesia 07/30/2015   Diabetes mellitus type 2, diet-controlled ([place])    Skin cancer of face    Paroxysmal atrial fibrillation ([place]) 09/01/2014   Right carotid bruit 11/18/2013   Postoperative anemia due to acute blood loss 03/03/2012   Hypertension    Hyperlipidemia    Swelling of extremity    Cataract    Hypothyroidism    GERD (gastroesophageal reflux disease)    Arthritis    Insomnia     Past Medical History:  Diagnosis Date   Anxiety attack    "take RX prn" (08/13/2013)   Arthritis    "probably" (08/13/2013)   Atrial fibrillation ([place])  new onset/pt report 08/13/2013   Bronchitis 05/108   Complication of anesthesia    Depression    Diabetes mellitus type 2, diet-controlled ([place])    denies   Femur fracture ([place])    left proximal femur non-union   GERD (gastroesophageal reflux disease)    H/O hiatal hernia    Hardware complicating wound infection ([place]) 08/22/2016   History of blood transfusion    "w/both knee OR's" (08/13/2013)   Hyperlipidemia    Hyperlipidemia    Hypertension    Hypertensive urgency 10/22/2018   Hypothyroidism    takes synthroid   Insomnia    Intertrochanteric fracture of right hip ([place]) 07/30/2015   Neuromuscular disorder ([facility])    carpal tunnel in right hand   Periprosthetic fracture around internal prosthetic right hip joint ([place]), nonunion basicervical femoral neck with broken hardware and subtrochanteric fracture 03/16/2016   PONV (postoperative nausea  and vomiting)    Prosthetic hip infection ([place]) 08/22/2016   Proteus infection 08/22/2016   Serratia infection 11/14/2016   Skin cancer of face 2000's   "one on each side of my face and one on my nose" (08/13/2013)   Swelling of extremity    sees Dr. Gwenlyn Found, cardiologist 1 x year [phone]   Tendon dysfunction 08/22/2016   Wears hearing aid    [facility]    Past Surgical History:  Procedure Laterality Date   2D ECHOCARDIOGRAM  12/07/2004   EF 48%, moderate pulmonary hypertension   BREAST BIOPSY Left ?1970's   CARDIOVASCULAR STRESS TEST  12/07/11   CARDIOVASCULAR STRESS TEST  12/07/2011   LEXISCAN, normal scan, no significant wall abnormalities noted   CARPAL TUNNEL RELEASE Right ~ 2005   CATARACT EXTRACTION, BILATERAL Bilateral 2000   DILATION AND CURETTAGE OF UTERUS  1950's   DOPPLER ECHOCARDIOGRAPHY  12/07/11   EYE SURGERY     FEMUR IM NAIL Right 08/01/2015   Procedure: INTRAMEDULLARY (IM) NAIL FEMORAL;  Surgeon: Marchia Bond, MD;  Location: [facility];  Service: Orthopedics;  Laterality: Right;   HARDWARE REMOVAL Right 03/16/2016   Procedure: HARDWARE REMOVAL;  Surgeon: Marchia Bond, MD;  Location: [facility];  Service: Orthopedics;  Laterality: Right;   INTRAMEDULLARY (IM) NAIL INTERTROCHANTERIC Left 01/26/2017   Procedure: INTRAMEDULLARY (IM) NAIL INTERTROCHANTRIC;  Surgeon: Leandrew Koyanagi, MD;  Location: [facility];  Service: Orthopedics;  Laterality: Left;   JOINT REPLACEMENT     bilateral knee replacement   SKIN CANCER EXCISION  2000's   "off my nose" (08/13/2013)   TOTAL HIP ARTHROPLASTY Right 03/16/2016   Procedure: TOTAL HIP ARTHROPLASTY;  Surgeon: Marchia Bond, MD;  Location: [facility];  Service: Orthopedics;  Laterality: Right;   TOTAL HIP ARTHROPLASTY Left 06/09/2017   Procedure: REMOVE LEFT HIP INTRAMEDULLARY NAIL AND CONVERT TO LEFT CEMENTED HEMI HIP ARTHROPLASTY;  Surgeon: Leandrew Koyanagi, MD;  Location: [place];  Service: Orthopedics;  Laterality: Left;   TOTAL KNEE ARTHROPLASTY Right 2005   TOTAL  KNEE ARTHROPLASTY  03/02/2012   Procedure: TOTAL KNEE ARTHROPLASTY;  Surgeon: Yvette Rack., MD;  Location: [facility];  Service: Orthopedics;  Laterality: Left;    Current Outpatient Medications  Medication Sig Dispense Refill   acetaminophen (TYLENOL) 325 MG tablet Take 650 mg by mouth every 6 (six) hours as needed for moderate pain.      apixaban (ELIQUIS) 2.5 MG TABS tablet 2.5 mg.     chlorthalidone (HYGROTON) 25 MG tablet TAKE 1 TABLET ONCE DAILY. 90 tablet 3   denosumab (PROLIA) 60 MG/ML SOLN  injection Inject 60 mg into the skin every 6 (six) months. Administer in upper arm, thigh, or abdomen     diltiazem (CARDIZEM CD) 180 MG 24 hr capsule TAKE 1 CAPSULE DAILY. 90 capsule 2   DULoxetine (CYMBALTA) 60 MG capsule Take 60 mg by mouth daily.      Flaxseed, Linseed, (FLAXSEED OIL) 1000 MG CAPS Take 1,000 mg by mouth 2 (two) times daily.      furosemide (LASIX) 20 MG tablet Take 20 mg by mouth daily as needed.      levothyroxine (SYNTHROID) 112 MCG tablet Take 112 mcg by mouth daily before breakfast.      magnesium hydroxide (MILK OF MAGNESIA) 400 MG/5ML suspension Take 30 mLs by mouth every other day.     metoprolol (LOPRESSOR) 50 MG tablet Take 50 mg by mouth 2 (two) times daily.     Multiple Vitamins-Minerals (PRESERVISION AREDS 2+MULTI VIT PO) Take by mouth 2 (two) times daily. 250-90-40-1 mg capsule     potassium chloride SA (KLOR-CON) 20 MEQ tablet Take 20 mEq by mouth daily as needed.     senna (SENOKOT) 8.6 MG tablet Take 2 tablets by mouth at bedtime.     sulfamethoxazole-trimethoprim (BACTRIM) 400-80 MG tablet Take 1 tablet by mouth 2 (two) times daily. 60 tablet 11   temazepam (RESTORIL) 30 MG capsule Take 1 capsule (30 mg total) by mouth at bedtime as needed for sleep. 30 capsule 0   Cholecalciferol 2000 units CAPS Take 2,000 Units by mouth every evening. (Patient not taking: Reported on 06/25/2021)     esomeprazole (NEXIUM) 20 MG capsule 1 capsule (Patient not taking: Reported on  06/25/2021)     No current facility-administered medications for this visit.     ALLERGIES: Levaquin [levofloxacin] and Morphine and related  Family History  Problem Relation Age of Onset   Arrhythmia Mother    Heart disease Mother    Arrhythmia Sister    Stroke Father 11   Heart disease Brother    Heart disease Brother    Cancer Brother        Colon cancer   Arrhythmia Brother    Heart disease Brother    Heart disease Sister    Heart disease Sister    Stroke Sister    Diabetes Sister    Heart disease Child 61   Heart disease Child 17    Social History   Socioeconomic History   Marital status: Widowed    Spouse name: Not on file   Number of children: Not on file   Years of education: Not on file   Highest education level: Not on file  Occupational History   Not on file  Tobacco Use   Smoking status: Never   Smokeless tobacco: Never  Substance and Sexual Activity   Alcohol use: No   Drug use: No   Sexual activity: Not Currently  Other Topics Concern   Not on file  Social History Narrative   Widowed. Lives at [facility]. Son died in the [place] War and she has not slept well since then.   Admitted to [facility] 01/30/17   Never smoked   Alcohol none   Full Code   Social Determinants of Health   Financial Resource Strain: Not on file  Food Insecurity: Not on file  Transportation Needs: Not on file  Physical Activity: Not on file  Stress: Not on file  Social Connections: Not on file  Intimate Partner Violence: Not on file  Review of Systems  Genitourinary:        Urinary incontinence   PHYSICAL EXAMINATION:    BP 126/80 (BP Location: Right Arm, Patient Position: Sitting, Cuff Size: Normal)   Pulse 78   Ht 5\' 2"  (1.575 m)   Wt 132 lb (59.9 kg)   SpO2 97%   BMI 24.14 kg/m     General appearance: alert, cooperative and appears stated age Lungs: clear to auscultation bilaterally Heart: regular rate and rhythm Abdomen: soft,  non-tender, fullness in lower abdomen.  Umbilicus no lesions.  Extremities: extremities normal, atraumatic, no cyanosis or edema No abnormal inguinal nodes palpated  Pelvic: External genitalia:  no lesions              Urethra:  normal appearing urethra with no masses, tenderness or lesions              Bartholins and Skenes: normal                 Vagina: normal appearing vagina with normal color and discharge, no lesions              Cervix: no lesions                Bimanual Exam:  Uterus: midline, smooth pelvic mass to umbilicus in the midline.                Adnexa:  Not palpated separately from the mass.              Rectal exam: yes.  Confirms.              Anus:  normal sphincter tone, no lesions  Chaperone was present for exam.  ASSESSMENT  New onset progressive stress incontinence.  Pelvic mass.  Atrial fibrillation.  On Eliquis.  On Cymbalta. On Bactrim bid for prophylaxis.   PLAN  Urinalysis:  sg 1.015, pH 8.5, 0 - 5 WBC, NS RBC, 0 - 5 squams, new bacteria, amorphous sediment.   UC sent.  Will need CT abdomen and pelvis.   Days not available are 7/26 - 7/29.   BMP drawn today.    Conversation with patient's son, Dr. Pamala Hurry, after visit completed to discuss the visit today and plan for the CT imaging with anticipated referral to specialist after results are available.  45 min  total time was spent for this patient encounter, including preparation, face-to-face counseling with the patient, coordination of care, and documentation of the encounter.

## 2021-06-27 LAB — URINALYSIS, COMPLETE W/RFL CULTURE
Bilirubin Urine: NEGATIVE
Glucose, UA: NEGATIVE
Hgb urine dipstick: NEGATIVE
Hyaline Cast: NONE SEEN /LPF
Ketones, ur: NEGATIVE
Leukocyte Esterase: NEGATIVE
Nitrites, Initial: NEGATIVE
Protein, ur: NEGATIVE
RBC / HPF: NONE SEEN /HPF (ref 0–2)
Specific Gravity, Urine: 1.017 (ref 1.001–1.035)
pH: 8.5 — ABNORMAL HIGH (ref 5.0–8.0)

## 2021-06-27 LAB — CULTURE INDICATED

## 2021-06-27 LAB — URINE CULTURE
MICRO NUMBER:: 12124769
SPECIMEN QUALITY:: ADEQUATE

## 2021-06-28 NOTE — Telephone Encounter (Signed)
Spoke with [place] and he is informed with below.

## 2021-06-28 NOTE — Telephone Encounter (Signed)
Sent to [facility] for prior approval if needed for the below.

## 2021-06-28 NOTE — Telephone Encounter (Signed)
Left message for [facility] to call on preferred # .

## 2021-06-29 ENCOUNTER — Other Ambulatory Visit: Payer: Self-pay

## 2021-06-29 MED ORDER — AMOXICILLIN 500 MG PO CAPS: 500.0000 mg | ORAL_CAPSULE | Freq: Two times a day (BID) | ORAL | 0 refills | Status: DC

## 2021-07-01 ENCOUNTER — Ambulatory Visit
Admission: RE | Admit: 2021-07-01 | Discharge: 2021-07-01 | Disposition: A | Discharge status: Home / Self Care | Payer: Medicare Other | Source: Ambulatory Visit | Attending: Obstetrics and Gynecology | Admitting: Obstetrics and Gynecology

## 2021-07-01 DIAGNOSIS — R1900 Intra-abdominal and pelvic swelling, mass and lump, unspecified site: Secondary | ICD-10-CM

## 2021-07-01 DIAGNOSIS — K8020 Calculus of gallbladder without cholecystitis without obstruction: Secondary | ICD-10-CM

## 2021-07-01 DIAGNOSIS — R32 Unspecified urinary incontinence: Secondary | ICD-10-CM | POA: Diagnosis not present

## 2021-07-01 DIAGNOSIS — N3289 Other specified disorders of bladder: Secondary | ICD-10-CM

## 2021-07-01 DIAGNOSIS — K5730 Diverticulosis of large intestine without perforation or abscess without bleeding: Secondary | ICD-10-CM | POA: Diagnosis not present

## 2021-07-01 DIAGNOSIS — K838 Other specified diseases of biliary tract: Secondary | ICD-10-CM

## 2021-07-01 DIAGNOSIS — S32010A Wedge compression fracture of first lumbar vertebra, initial encounter for closed fracture: Secondary | ICD-10-CM

## 2021-07-01 HISTORY — DX: Other specified diseases of biliary tract: K83.8

## 2021-07-01 HISTORY — DX: Other specified disorders of bladder: N32.89

## 2021-07-01 HISTORY — DX: Calculus of gallbladder without cholecystitis without obstruction: K80.20

## 2021-07-01 HISTORY — DX: Wedge compression fracture of first lumbar vertebra, initial encounter for closed fracture: S32.010A

## 2021-07-01 MED ORDER — IOPAMIDOL (ISOVUE-300) INJECTION 61%
100.0000 mL | Freq: Once | INTRAVENOUS | Status: AC | PRN
  Administered 2021-07-01: 100 mL via INTRAVENOUS

## 2021-07-03 ENCOUNTER — Telehealth: Payer: Self-pay | Admitting: Obstetrics and Gynecology

## 2021-07-03 ENCOUNTER — Encounter: Payer: Self-pay | Admitting: Obstetrics and Gynecology

## 2021-07-03 NOTE — Telephone Encounter (Signed)
Please use phone number [phone] as the preferred number to contact patient's son, Dr. Pamala Hurry for coordination of care of his mother, Leah Holland.   He indicates the is the best number to call.

## 2021-07-03 NOTE — Telephone Encounter (Signed)
Phone call with patient's son, Dr. Pamala Hurry regarding results of CT of abdomen and pelvis. He did receive the results through My Chart and states he called the office yesterday for more information and a plan.  Patient had umbilical pain and a large pelvic mass in the office the day of her evaluation which prompted this imaging.   Her CT shows bladder thickening, a 4 mm calcified stone in the gallbladder neck with dilation of the common duct, and a chronic L1 vertebral body compression fracture.  No pelvic or abdominal mass was seen.  I suspect the patient has significant intermittent urinary retention. The thickening of the bladder due to multiple etiologies which may include infection, a neurogenic bladder, or abnormal cells.  She is currently treating with Amoxicillin for UTI. Her Cymbalta may be contributing to urinary retention.   Will proceed forward with urology consultation, first available at [facility]. Please place this referral. Patient is not available this coming week.

## 2021-07-05 NOTE — Telephone Encounter (Signed)
Office notes faxed to [facility] they will call patient son Patrick Jupiter to schedule.

## 2021-07-22 ENCOUNTER — Other Ambulatory Visit: Payer: Self-pay | Admitting: Cardiovascular Disease

## 2021-08-10 ENCOUNTER — Ambulatory Visit: Payer: Medicare Other | Admitting: General Practice

## 2021-08-11 DIAGNOSIS — R1900 Intra-abdominal and pelvic swelling, mass and lump, unspecified site: Secondary | ICD-10-CM | POA: Diagnosis not present

## 2021-08-11 DIAGNOSIS — N393 Stress incontinence (female) (male): Secondary | ICD-10-CM | POA: Diagnosis not present

## 2021-08-12 ENCOUNTER — Telehealth: Payer: Self-pay | Admitting: *Deleted

## 2021-08-12 DIAGNOSIS — E039 Hypothyroidism, unspecified: Secondary | ICD-10-CM | POA: Diagnosis not present

## 2021-08-12 DIAGNOSIS — D509 Iron deficiency anemia, unspecified: Secondary | ICD-10-CM | POA: Diagnosis not present

## 2021-08-12 DIAGNOSIS — F329 Major depressive disorder, single episode, unspecified: Secondary | ICD-10-CM | POA: Diagnosis not present

## 2021-08-12 DIAGNOSIS — G4700 Insomnia, unspecified: Secondary | ICD-10-CM | POA: Diagnosis not present

## 2021-08-12 DIAGNOSIS — R1900 Intra-abdominal and pelvic swelling, mass and lump, unspecified site: Secondary | ICD-10-CM

## 2021-08-12 DIAGNOSIS — I10 Essential (primary) hypertension: Secondary | ICD-10-CM | POA: Diagnosis not present

## 2021-08-12 DIAGNOSIS — M810 Age-related osteoporosis without current pathological fracture: Secondary | ICD-10-CM | POA: Diagnosis not present

## 2021-08-12 DIAGNOSIS — E785 Hyperlipidemia, unspecified: Secondary | ICD-10-CM | POA: Diagnosis not present

## 2021-08-12 DIAGNOSIS — I4891 Unspecified atrial fibrillation: Secondary | ICD-10-CM | POA: Diagnosis not present

## 2021-08-12 NOTE — Telephone Encounter (Signed)
Patient son Amayrani Nicoli called and said patient had appointment with Dr. Jeffie Pollock (urology) yesterday regarding CT showing bladder thickening. Patrick Jupiter said that Dr.Wrenn said patient bladder is fine and recommends patient follow up with your regarding pelvic mass. Patient son said Dr.Wrenn said he was going to send you the office note from patient visit. I did explain to the son, I believe they fax the office notes from urology,but I would be back in contact with him once I hear from you. Please advise

## 2021-08-13 NOTE — Telephone Encounter (Signed)
I received a message from Dr. Jeffie Pollock.   I recommend a pelvic ultrasound on the day of the patient's recheck appointment with me.

## 2021-08-13 NOTE — Telephone Encounter (Signed)
Spoke with patient son Patrick Jupiter, will have appointments reach out and schedule pelvic ultrasound/ recheck visit. Order placed for ultrasound.

## 2021-08-13 NOTE — Telephone Encounter (Signed)
Please schedule the patient for a recheck with me.   Her CT scan showed no evidence of a mass.  I believe that her mass was a significantly distended bladder.  I suspect she has urinary retention.

## 2021-08-17 NOTE — Telephone Encounter (Signed)
Patient scheduled on 09/09/21 for pelvic ultrasound.

## 2021-08-19 NOTE — Progress Notes (Signed)
Cardiology Office Note:    Date:  08/20/2021   ID:  ESM… FLAMMER, DOB 09-Aug-1922, MRN [id number]  PCP:  [name], MD Referring MD: [name], MD    [place]  Cardiologist:  Quay Burow, MD   Reason for visit: 28-monthfollow-up  History of Present Illness:    Leah SAYREis a 87y.o. female with a hx of hypertension, hyperlipidemia, atrial fibrillation, GERD, lower extremity edema, orthostatic hypotension, recurrent right and left hip fractures.  Dr. BAlvester Chousaw her in March 2022 and states she was stable with dyspnea on exertion.  Echo in January 2022 showed normal LV systolic function, RV dysfunction with increased RV systolic pressure and moderate to severe TR.  Today she comes in with her son who is a retired pLexicographer  She is doing well overall.  She uses a walker for ambulation with her history of recurrent hip fractures.  She states her shortness of breath is overall better.  She continues with mild lightheadedness with standing quickly.  She tries to get up slowly.  She has no significant fatigue.  She states she has not needed Lasix in over 6 months.  She will take Lasix if she has worsening leg swelling or weight gain over 3 pounds.  She denies palpitations.  No bleeding with Eliquis.  She brings in her blood pressure log with recent readings of 146/82, 143/68, and 145/76.  She denies chest pain.    Past Medical History:  Diagnosis Date   Anxiety attack    "take RX prn" (08/13/2013)   Arthritis    "probably" (08/13/2013)   Atrial fibrillation ([facility]    new onset/pt report 08/13/2013   Bladder wall thickening 07/01/2021   CT scan in Epic   Bronchitis 12/2011   Cholelithiasis 07/01/2021   CT in Epic.   Common bile duct dilation 07/01/2021   CT in Epic:  13 mm.   Complication of anesthesia    Compression fracture of first lumbar vertebra ([facility] 07/01/2021   CT in Epic.   Depression    Diabetes mellitus type 2,  diet-controlled ([facility]    denies   Femur fracture ([facility]    left proximal femur non-union   GERD (gastroesophageal reflux disease)    H/O hiatal hernia    Hardware complicating wound infection ([facility] [facility] 08/22/2016   History of blood transfusion    "w/both knee OR's" (08/13/2013)   Hyperlipidemia    Hyperlipidemia    Hypertension    Hypertensive urgency 10/22/2018   Hypothyroidism    takes synthroid   Insomnia    Intertrochanteric fracture of right hip ([facility] 07/30/2015   Neuromuscular disorder (HCC)    carpal tunnel in right hand   Periprosthetic fracture around internal prosthetic right hip joint ([facility], nonunion basicervical femoral neck with broken hardware and subtrochanteric fracture 03/16/2016   PONV (postoperative nausea and vomiting)    Prosthetic hip infection ([facility] 08/22/2016   Proteus infection 08/22/2016   Serratia infection 11/14/2016   Skin cancer of face 2000's   "one on each side of my face and one on my nose" (08/13/2013)   Swelling of extremity    sees Dr. BGwenlyn Found cardiologist 1 x year [phone]  Tendon dysfunction 08/22/2016   [facility] hearing aid    [facility]   Past Surgical History:  Procedure Laterality Date   2D ECHOCARDIOGRAM  12/07/2004   EF 48%, moderate pulmonary hypertension   BREAST BIOPSY Left ?1970's   CARDIOVASCULAR STRESS  TEST  12/07/11   CARDIOVASCULAR STRESS TEST  12/07/2011   LEXISCAN, normal scan, no significant wall abnormalities noted   CARPAL TUNNEL RELEASE Right ~ 2005   CATARACT EXTRACTION, BILATERAL Bilateral 2000   DILATION AND CURETTAGE OF UTERUS  1950's   DOPPLER ECHOCARDIOGRAPHY  12/07/11   EYE SURGERY     FEMUR IM NAIL Right 08/01/2015   Procedure: INTRAMEDULLARY (IM) NAIL FEMORAL;  Surgeon: Marchia Bond, MD;  Location: [place];  Service: Orthopedics;  Laterality: Right;   HARDWARE REMOVAL Right 03/16/2016   Procedure: HARDWARE REMOVAL;  Surgeon: Marchia Bond, MD;  Location: [place];  Service: Orthopedics;  Laterality: Right;    INTRAMEDULLARY (IM) NAIL INTERTROCHANTERIC Left 01/26/2017   Procedure: INTRAMEDULLARY (IM) NAIL INTERTROCHANTRIC;  Surgeon: Leandrew Koyanagi, MD;  Location: [place];  Service: Orthopedics;  Laterality: Left;   JOINT REPLACEMENT     bilateral knee replacement   SKIN CANCER EXCISION  2000's   "off my nose" (08/13/2013)   TOTAL HIP ARTHROPLASTY Right 03/16/2016   Procedure: TOTAL HIP ARTHROPLASTY;  Surgeon: Marchia Bond, MD;  Location: [place];  Service: Orthopedics;  Laterality: Right;   TOTAL HIP ARTHROPLASTY Left 06/09/2017   Procedure: REMOVE LEFT HIP INTRAMEDULLARY NAIL AND CONVERT TO LEFT CEMENTED HEMI HIP ARTHROPLASTY;  Surgeon: Leandrew Koyanagi, MD;  Location: [place];  Service: Orthopedics;  Laterality: Left;   TOTAL KNEE ARTHROPLASTY Right 2005   TOTAL KNEE ARTHROPLASTY  03/02/2012   Procedure: TOTAL KNEE ARTHROPLASTY;  Surgeon: Yvette Rack., MD;  Location: [place];  Service: Orthopedics;  Laterality: Left;    Current Medications: Current Meds  Medication Sig   acetaminophen (TYLENOL) 325 MG tablet Take 650 mg by mouth every 6 (six) hours as needed for moderate pain.    amoxicillin (AMOXIL) 500 MG capsule Take 1 capsule (500 mg total) by mouth 2 (two) times daily.   chlorthalidone (HYGROTON) 25 MG tablet TAKE 1 TABLET ONCE DAILY.   Cholecalciferol 2000 units CAPS Take 2,000 Units by mouth every evening.   denosumab (PROLIA) 60 MG/ML SOLN injection Inject 60 mg into the skin every 6 (six) months. Administer in upper arm, thigh, or abdomen   diltiazem (CARDIZEM CD) 180 MG 24 hr capsule TAKE 1 CAPSULE DAILY.   DULoxetine (CYMBALTA) 60 MG capsule Take 60 mg by mouth daily.    ELIQUIS 2.5 MG TABS tablet TAKE 1 TABLET BY MOUTH TWICE DAILY.   esomeprazole (NEXIUM) 20 MG capsule    Flaxseed, Linseed, (FLAXSEED OIL) 1000 MG CAPS Take 1,000 mg by mouth 2 (two) times daily.    furosemide (LASIX) 20 MG tablet Take 20 mg by mouth daily as needed.    levothyroxine (SYNTHROID) 112 MCG tablet Take 112 mcg by  mouth daily before breakfast.    magnesium hydroxide (MILK OF MAGNESIA) 400 MG/5ML suspension Take 30 mLs by mouth every other day.   metoprolol (LOPRESSOR) 50 MG tablet Take 50 mg by mouth 2 (two) times daily.   Multiple Vitamins-Minerals (PRESERVISION AREDS 2+MULTI VIT PO) Take by mouth 2 (two) times daily. 250-90-40-1 mg capsule   potassium chloride SA (KLOR-CON) 20 MEQ tablet Take 20 mEq by mouth daily as needed.   senna (SENOKOT) 8.6 MG tablet Take 2 tablets by mouth at bedtime.   sulfamethoxazole-trimethoprim (BACTRIM) 400-80 MG tablet Take 1 tablet by mouth 2 (two) times daily.   temazepam (RESTORIL) 30 MG capsule Take 1 capsule (30 mg total) by mouth at bedtime as needed for sleep.     Allergies:  Levaquin [levofloxacin] and Morphine and related   Social History   Socioeconomic History   Marital status: Widowed    Spouse name: Not on file   Number of children: Not on file   Years of education: Not on file   Highest education level: Not on file  Occupational History   Not on file  Tobacco Use   Smoking status: Never   Smokeless tobacco: Never  Substance and Sexual Activity   Alcohol use: No   Drug use: No   Sexual activity: Not Currently  Other Topics Concern   Not on file  Social History Narrative   Widowed. Lives at [facility]. Son died in the [place] War and she has not slept well since then.   Admitted to [facility] 01/30/17   Never smoked   Alcohol none   Full Code   Social Determinants of Health   Financial Resource Strain: Not on file  Food Insecurity: Not on file  Transportation Needs: Not on file  Physical Activity: Not on file  Stress: Not on file  Social Connections: Not on file     Family History: The patient's family history includes Arrhythmia in her brother, mother, and sister; Cancer in her brother; Diabetes in her sister; Heart disease in her brother, brother, brother, mother, sister, and sister; Heart disease (age of onset:  20) in her child; Heart disease (age of onset: 4) in her child; Stroke in her sister; Stroke (age of onset: 75) in her father.  ROS:   Please see the history of present illness.     EKGs/Labs/Other Studies Reviewed:    EKG:  The ekg ordered today demonstrates atrial fibrillation with competing junctional pacemaker, anterior septal infarct, heart rate 69, QRS duration 82 ms, no change from March 2022.  Recent Labs: 10/02/2020: TSH 1.197 10/06/2020: B Natriuretic Peptide 139.0; Magnesium 1.6 01/19/2021: ALT 17; Hemoglobin 14.3; Platelets 281 06/25/2021: BUN 13; Creat 0.99; Potassium 4.2; Sodium 135  Recent Lipid Panel    Component Value Date/Time   CHOL 116 04/26/2013 2059   TRIG 56 04/26/2013 2059   HDL 45 04/26/2013 2059   CHOLHDL 2.6 04/26/2013 2059   VLDL 11 04/26/2013 2059   LDLCALC 60 04/26/2013 2059    Physical Exam:    VS:  BP (!) 168/78   Pulse 69   Ht '5\' 2"'$  (1.575 m)   Wt 133 lb 6.4 oz (60.5 kg)   SpO2 96%   BMI 24.40 kg/m     Wt Readings from Last 3 Encounters:  08/20/21 133 lb 6.4 oz (60.5 kg)  06/25/21 132 lb (59.9 kg)  02/12/21 133 lb 6.4 oz (60.5 kg)     GEN: Elderly, well nourished, well developed in no acute distress HEENT: Normal NECK: No JVD; No carotid bruits CARDIAC: Irregular irregular RESPIRATORY:  Clear to auscultation without rales, wheezing or rhonchi  ABDOMEN: Soft, non-tender, non-distended MUSCULOSKELETAL: Minimal right ankle swelling, no left leg swelling; No deformity  SKIN: Warm and dry NEUROLOGIC:  Alert and oriented PSYCHIATRIC:  Normal affect   ASSESSMENT AND PLAN   Paroxysmal atrial fibrillation, asymptomatic -EKG shows A. fib with competing competing junctional pacemaker, heart rate 60, QRS duration 82 ms. -Continue rate control therapy -Continue Eliquis  Dyspnea on exertion, improved -Echo in February 2022 showed RV dysfunction and increased right ventricular pressure.  Dr. Alvester Chou thought was secondary to commendation of  valvular heart disease and chronic A. fib.  He did not recommend further work-up given her age.  Hypertension, reasonably controlled -Continue chlorthalidone and Cardizem.  Last basic metabolic panel with normal potassium and creatinine. -With her history of possible orthostatic hypotension and her age, I am okay with systolic blood pressures in the 140s. -Recommend DASH diet (high in vegetables, fruits, low-fat dairy products, whole grains, poultry, fish, and nuts and low in sweets, sugar-sweetened beverages, and red meats), salt restriction and increase physical activity.   Disposition - Follow-up in 6 months with Dr. Alvester Chou      Medication Adjustments/Labs and Tests Ordered: Current medicines are reviewed at length with the patient today.  Concerns regarding medicines are outlined above.  Orders Placed This Encounter  Procedures   EKG 12-Lead   No orders of the defined types were placed in this encounter.   Patient Instructions  Medication Instructions:  No Changes *If you need a refill on your cardiac medications before your next appointment, please call your pharmacy*   Lab Work: No Labs If you have labs (blood work) drawn today and your tests are completely normal, you will receive your results only by: [place] (if you have MyChart) OR A paper copy in the mail If you have any lab test that is abnormal or we need to change your treatment, we will call you to review the results.   Testing/Procedures: No Testing   Follow-Up: At [facility], you and your health needs are our priority.  As part of our continuing mission to provide you with exceptional heart care, we have created designated Provider Care Teams.  These Care Teams include your primary Cardiologist (physician) and Advanced Practice Providers (APPs -  Physician Assistants and Nurse Practitioners) who all work together to provide you with the care you need, when you need it.  We recommend signing up  for the patient portal called "MyChart".  Sign up information is provided on this After Visit Summary.  MyChart is used to connect with patients for Virtual Visits (Telemedicine).  Patients are able to view lab/test results, encounter notes, upcoming appointments, etc.  Non-urgent messages can be sent to your provider as well.   To learn more about what you can do with MyChart, go to [email or web address].    Your next appointment:   6 month(s)  The format for your next appointment:   In Person  Provider:   Quay Burow, MD      Signed, Warren Lacy, PA-C  08/20/2021 12:52 PM    [facility]

## 2021-08-20 ENCOUNTER — Ambulatory Visit (INDEPENDENT_AMBULATORY_CARE_PROVIDER_SITE_OTHER): Payer: Medicare Other | Admitting: Physician Assistant

## 2021-08-20 ENCOUNTER — Encounter: Payer: Self-pay | Admitting: Physician Assistant

## 2021-08-20 ENCOUNTER — Other Ambulatory Visit: Payer: Self-pay

## 2021-08-20 VITALS — BP 168/78 | HR 69 | Ht 62.0 in | Wt 133.4 lb

## 2021-08-20 DIAGNOSIS — R0600 Dyspnea, unspecified: Secondary | ICD-10-CM

## 2021-08-20 DIAGNOSIS — E782 Mixed hyperlipidemia: Secondary | ICD-10-CM

## 2021-08-20 DIAGNOSIS — I10 Essential (primary) hypertension: Secondary | ICD-10-CM | POA: Diagnosis not present

## 2021-08-20 DIAGNOSIS — I480 Paroxysmal atrial fibrillation: Secondary | ICD-10-CM

## 2021-08-20 DIAGNOSIS — R0609 Other forms of dyspnea: Secondary | ICD-10-CM

## 2021-08-20 NOTE — Patient Instructions (Signed)
Medication Instructions:  ?No Changes ?*If you need a refill on your cardiac medications before your next appointment, please call your pharmacy* ? ? ?Lab Work: ?No Labs ?If you have labs (blood work) drawn today and your tests are completely normal, you will receive your results only by: ?MyChart Message (if you have MyChart) OR ?A paper copy in the mail ?If you have any lab test that is abnormal or we need to change your treatment, we will call you to review the results. ? ? ?Testing/Procedures: ?No Testing ? ? ?Follow-Up: ?At [facility], you and your health needs are our priority.  As part of our continuing mission to provide you with exceptional heart care, we have created designated Provider Care Teams.  These Care Teams include your primary Cardiologist (physician) and Advanced Practice Providers (APPs -  Physician Assistants and Nurse Practitioners) who all work together to provide you with the care you need, when you need it. ? ?We recommend signing up for the patient portal called "MyChart".  Sign up information is provided on this After Visit Summary.  MyChart is used to connect with patients for Virtual Visits (Telemedicine).  Patients are able to view lab/test results, encounter notes, upcoming appointments, etc.  Non-urgent messages can be sent to your provider as well.   ?To learn more about what you can do with MyChart, go to [email or web address].   ? ?Your next appointment:   ?6 month(s) ? ?The format for your next appointment:   ?In Person ? ?Provider:   ?Jonathan Berry, MD   ? ? ?  ?

## 2021-08-24 DIAGNOSIS — L821 Other seborrheic keratosis: Secondary | ICD-10-CM | POA: Diagnosis not present

## 2021-08-24 DIAGNOSIS — L570 Actinic keratosis: Secondary | ICD-10-CM | POA: Diagnosis not present

## 2021-08-24 DIAGNOSIS — L814 Other melanin hyperpigmentation: Secondary | ICD-10-CM | POA: Diagnosis not present

## 2021-08-25 DIAGNOSIS — M6281 Muscle weakness (generalized): Secondary | ICD-10-CM | POA: Diagnosis not present

## 2021-08-25 DIAGNOSIS — R102 Pelvic and perineal pain: Secondary | ICD-10-CM | POA: Diagnosis not present

## 2021-08-25 DIAGNOSIS — N393 Stress incontinence (female) (male): Secondary | ICD-10-CM | POA: Diagnosis not present

## 2021-08-25 DIAGNOSIS — R262 Difficulty in walking, not elsewhere classified: Secondary | ICD-10-CM | POA: Diagnosis not present

## 2021-08-25 DIAGNOSIS — M6289 Other specified disorders of muscle: Secondary | ICD-10-CM | POA: Diagnosis not present

## 2021-09-03 DIAGNOSIS — M6289 Other specified disorders of muscle: Secondary | ICD-10-CM | POA: Diagnosis not present

## 2021-09-03 DIAGNOSIS — M6281 Muscle weakness (generalized): Secondary | ICD-10-CM | POA: Diagnosis not present

## 2021-09-03 DIAGNOSIS — N3946 Mixed incontinence: Secondary | ICD-10-CM | POA: Diagnosis not present

## 2021-09-03 DIAGNOSIS — M62838 Other muscle spasm: Secondary | ICD-10-CM | POA: Diagnosis not present

## 2021-09-09 ENCOUNTER — Ambulatory Visit (INDEPENDENT_AMBULATORY_CARE_PROVIDER_SITE_OTHER): Payer: Medicare Other | Admitting: Obstetrics and Gynecology

## 2021-09-09 ENCOUNTER — Other Ambulatory Visit: Payer: Self-pay

## 2021-09-09 ENCOUNTER — Encounter: Payer: Self-pay | Admitting: Obstetrics and Gynecology

## 2021-09-09 ENCOUNTER — Telehealth: Payer: Self-pay | Admitting: Obstetrics and Gynecology

## 2021-09-09 ENCOUNTER — Ambulatory Visit (INDEPENDENT_AMBULATORY_CARE_PROVIDER_SITE_OTHER): Payer: Medicare Other

## 2021-09-09 VITALS — BP 110/58 | HR 72 | Resp 14 | Ht 62.0 in | Wt 132.0 lb

## 2021-09-09 DIAGNOSIS — R1900 Intra-abdominal and pelvic swelling, mass and lump, unspecified site: Secondary | ICD-10-CM

## 2021-09-09 NOTE — Progress Notes (Signed)
GYNECOLOGY  VISIT   HPI: 87 y.o.   Widowed  Caucasian  female   [phone] with No LMP recorded. Patient is postmenopausal.   here for  pelvic ultrasound follow up.   He patient's son Patrick Jupiter, a retired physician, is present for the discussion portion of the visit today.   Patient was seen for urinary incontinence on 06/25/21 and she was noted to have periumbilical pain a mass of the umbilicus.  CT scan in follow up showed no pelvic mass but did show bladder thickening.  She has been taking Cymbalta, and I suspected she may have potential urinary retention with an enlarged bladder.   See image results below. Narrative & Impression  CLINICAL DATA:  Urinary incontinence.  Pelvic mass.   EXAM: CT ABDOMEN AND PELVIS WITH CONTRAST   TECHNIQUE: Multidetector CT imaging of the abdomen and pelvis was performed using the standard protocol following bolus administration of intravenous contrast.   CONTRAST:  112mL ISOVUE-300 IOPAMIDOL (ISOVUE-300) INJECTION 61%   COMPARISON:  None.   FINDINGS: Lower Chest: No acute findings.  Moderate cardiomegaly noted.   Hepatobiliary: No hepatic masses identified. A 4 mm calcified stone is seen in the gallbladder neck, however there is no evidence of cholecystitis. Dilatation of the common bile duct is seen measuring 13 mm, however there is no significant dilatation of intrahepatic bile ducts.   Pancreas: No mass or inflammatory changes. No evidence of pancreatic ductal dilatation.   Spleen: Within normal limits in size and appearance.   Adrenals/Urinary Tract: Several small renal cysts are noted bilaterally. No masses identified. No evidence of ureteral calculi or hydronephrosis. Bladder is partially obscured by artifact from bilateral hip prostheses. Mild diffuse bladder wall thickening is seen. Focal bladder wall thickening is also seen involving the right superior bladder wall, and bladder carcinoma cannot be excluded.   Stomach/Bowel: No  evidence of obstruction, inflammatory process or abnormal fluid collections. Normal appendix visualized. Diverticulosis is seen mainly involving the descending and sigmoid colon, however there is no evidence of diverticulitis.   Vascular/Lymphatic: No pathologically enlarged lymph nodes. No acute vascular findings. Aortic atherosclerotic calcification noted.   Reproductive:  No mass or other significant abnormality.   Other: Inferior pelvis is partially obscured due to artifact from bilateral hip prostheses.   Musculoskeletal: No suspicious bone lesions identified. Chronic appearing L1 vertebral body compression fracture deformity is noted.   IMPRESSION: Mild diffuse bladder wall thickening, which may be due to cystitis or neurogenic bladder.   Focal wall thickening involving the right superior bladder wall. Bladder carcinoma cannot be excluded. Recommend cystoscopy for further evaluation.   No evidence of metastatic disease.   Cholelithiasis. No radiographic evidence of cholecystitis.   Biliary ductal dilatation, with common bile duct measuring 13 mm. No etiology visualized by CT. Suggest correlation with liver function tests, and consider abdomen MRI and MRCP without and with contrast if clinically warranted.   Colonic diverticulosis. No radiographic evidence of diverticulitis.   Aortic Atherosclerosis (ICD10-I70.0).     Electronically Signed   By: Marlaine Hind M.D.   On: 07/02/2021 08:47    She was seen by urologist Dr. Jeffie Pollock and had cystoscopy showing no lesions of the bladder   She now returns for further evaluation of a cystic mass, which has not yet been explained.   She takes Tylenol for her pain.   GYNECOLOGIC HISTORY: No LMP recorded. Patient is postmenopausal. Contraception:  PMP Menopausal hormone therapy:  none Last mammogram:  years ago Last pap smear:  years ago        OB History     Gravida  3   Para  3   Term  3   Preterm      AB       Living  2      SAB      IAB      Ectopic      Multiple      Live Births  3              Patient Active Problem List   Diagnosis Date Noted   Dyspnea on exertion 02/12/2021   Hypophosphatemia 10/07/2020   Hypomagnesemia 10/07/2020   Hypokalemia 10/07/2020   Periprosthetic fracture around internal prosthetic left hip joint ([place]) 10/03/2020   Leg fracture, left, closed, initial encounter 10/01/2020   Hypertensive urgency 10/22/2018   Chronic low back pain 12/21/2017   Chronic antibiotic suppression 06/13/2017   History of hip replacement 06/09/2017   Ecchymosis 02/23/2017   Closed intertrochanteric fracture with nonunion, left 01/26/2017   Leukocytosis 01/26/2017   Fall 01/26/2017   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   Tendinopathy 08/22/2016   Depression, recurrent ([place]) 04/07/2016   Arthritis of knee, degenerative 04/07/2016   OP (osteoporosis) 04/07/2016   Agoraphobia with panic disorder 04/07/2016   Chronic anticoagulation 08/06/2015   Bipolar disorder ([place]) 08/04/2015   Hyponatremia 08/04/2015   Constipation 54/27/0623   Complication of anesthesia 07/30/2015   Diabetes mellitus type 2, diet-controlled ([place])    Skin cancer of face    Paroxysmal atrial fibrillation ([place]) 09/01/2014   Right carotid bruit 11/18/2013   Postoperative anemia due to acute blood loss 03/03/2012   Hypertension    Hyperlipidemia    Swelling of extremity    Cataract    Hypothyroidism    GERD (gastroesophageal reflux disease)    Arthritis    Insomnia     Past Medical History:  Diagnosis Date   Anxiety attack    "take RX prn" (08/13/2013)   Arthritis    "probably" (08/13/2013)   Atrial fibrillation ([place])    new onset/pt report 08/13/2013   Bladder wall thickening 07/01/2021   CT scan in Epic   Bronchitis 12/2011   Cholelithiasis 07/01/2021   CT in Epic.   Common bile duct dilation 07/01/2021   CT in Epic:  13 mm.   Complication of anesthesia    Compression fracture  of first lumbar vertebra ([place]) 07/01/2021   CT in Epic.   Depression    Diabetes mellitus type 2, diet-controlled ([place])    denies   Femur fracture ([place])    left proximal femur non-union   GERD (gastroesophageal reflux disease)    H/O hiatal hernia    Hardware complicating wound infection ([place]) 08/22/2016   History of blood transfusion    "w/both knee OR's" (08/13/2013)   Hyperlipidemia    Hyperlipidemia    Hypertension    Hypertensive urgency 10/22/2018   Hypothyroidism    takes synthroid   Insomnia    Intertrochanteric fracture of right hip ([place]) 07/30/2015   Neuromuscular disorder ([place])    carpal tunnel in right hand   Periprosthetic fracture around internal prosthetic right hip joint ([place]), nonunion basicervical femoral neck with broken hardware and subtrochanteric fracture 03/16/2016   PONV (postoperative nausea and vomiting)    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 Gwenlyn Found, cardiologist 1 x year [phone]   Tendon dysfunction 08/22/2016   Wears hearing aid    [facility]    Past Surgical History:  Procedure Laterality Date   2D ECHOCARDIOGRAM  12/07/2004   EF 48%, moderate pulmonary hypertension   BREAST BIOPSY Left ?1970's   CARDIOVASCULAR STRESS TEST  12/07/11   CARDIOVASCULAR STRESS TEST  12/07/2011   LEXISCAN, normal scan, no significant wall abnormalities noted   CARPAL TUNNEL RELEASE Right ~ 2005   CATARACT EXTRACTION, BILATERAL Bilateral 2000   DILATION AND CURETTAGE OF UTERUS  1950's   DOPPLER ECHOCARDIOGRAPHY  12/07/11   EYE SURGERY     FEMUR IM NAIL Right 08/01/2015   Procedure: INTRAMEDULLARY (IM) NAIL FEMORAL;  Surgeon: Marchia Bond, MD;  Location: [facility];  Service: Orthopedics;  Laterality: Right;   HARDWARE REMOVAL Right 03/16/2016   Procedure: HARDWARE REMOVAL;   Surgeon: Marchia Bond, MD;  Location: [facility];  Service: Orthopedics;  Laterality: Right;   INTRAMEDULLARY (IM) NAIL INTERTROCHANTERIC Left 01/26/2017   Procedure: INTRAMEDULLARY (IM) NAIL INTERTROCHANTRIC;  Surgeon: Leandrew Koyanagi, MD;  Location: [place] [facility];  Service: Orthopedics;  Laterality: Left;   JOINT REPLACEMENT     bilateral knee replacement   SKIN CANCER EXCISION  2000's   "off my nose" (08/13/2013)   TOTAL HIP ARTHROPLASTY Right 03/16/2016   Procedure: TOTAL HIP ARTHROPLASTY;  Surgeon: Marchia Bond, MD;  Location: [facility];  Service: Orthopedics;  Laterality: Right;   TOTAL HIP ARTHROPLASTY Left 06/09/2017   Procedure: REMOVE LEFT HIP INTRAMEDULLARY NAIL AND CONVERT TO LEFT CEMENTED HEMI HIP ARTHROPLASTY;  Surgeon: Leandrew Koyanagi, MD;  Location: [facility];  Service: Orthopedics;  Laterality: Left;   TOTAL KNEE ARTHROPLASTY Right 2005   TOTAL KNEE ARTHROPLASTY  03/02/2012   Procedure: TOTAL KNEE ARTHROPLASTY;  Surgeon: Yvette Rack., MD;  Location: [facility];  Service: Orthopedics;  Laterality: Left;    Current Outpatient Medications  Medication Sig Dispense Refill   acetaminophen (TYLENOL) 325 MG tablet Take 650 mg by mouth every 6 (six) hours as needed for moderate pain.      amoxicillin (AMOXIL) 500 MG capsule Take 1 capsule (500 mg total) by mouth 2 (two) times daily. 10 capsule 0   chlorthalidone (HYGROTON) 25 MG tablet TAKE 1 TABLET ONCE DAILY. 90 tablet 3   Cholecalciferol 2000 units CAPS Take 2,000 Units by mouth every evening.     denosumab (PROLIA) 60 MG/ML SOLN injection Inject 60 mg into the skin every 6 (six) months. Administer in upper arm, thigh, or abdomen     diltiazem (CARDIZEM CD) 180 MG 24 hr capsule TAKE 1 CAPSULE DAILY. 90 capsule 2   DULoxetine (CYMBALTA) 60 MG capsule Take 60 mg by mouth daily.      ELIQUIS 2.5 MG TABS tablet TAKE 1 TABLET BY MOUTH TWICE DAILY. 180 tablet 1   esomeprazole (NEXIUM) 20 MG capsule      Flaxseed, Linseed, (FLAXSEED OIL) 1000 MG CAPS Take 1,000 mg by  mouth 2 (two) times daily.      furosemide (LASIX) 20 MG tablet Take 20 mg by mouth daily as needed.      levothyroxine (SYNTHROID) 112 MCG tablet Take 112 mcg by mouth daily before breakfast.      magnesium hydroxide (MILK OF MAGNESIA) 400 MG/5ML suspension Take 30 mLs by mouth every other day.     metoprolol (LOPRESSOR) 50 MG tablet  Take 50 mg by mouth 2 (two) times daily.     Multiple Vitamins-Minerals (PRESERVISION AREDS 2+MULTI VIT PO) Take by mouth 2 (two) times daily. 250-90-40-1 mg capsule     potassium chloride SA (KLOR-CON) 20 MEQ tablet Take 20 mEq by mouth daily as needed.     senna (SENOKOT) 8.6 MG tablet Take 2 tablets by mouth at bedtime.     sulfamethoxazole-trimethoprim (BACTRIM) 400-80 MG tablet Take 1 tablet by mouth 2 (two) times daily. 60 tablet 11   temazepam (RESTORIL) 30 MG capsule Take 1 capsule (30 mg total) by mouth at bedtime as needed for sleep. 30 capsule 0   No current facility-administered medications for this visit.     ALLERGIES: Levaquin [levofloxacin] and Morphine and related  Family History  Problem Relation Age of Onset   Arrhythmia Mother    Heart disease Mother    Arrhythmia Sister    Stroke Father 24   Heart disease Brother    Heart disease Brother    Cancer Brother        Colon cancer   Arrhythmia Brother    Heart disease Brother    Heart disease Sister    Heart disease Sister    Stroke Sister    Diabetes Sister    Heart disease Child 75   Heart disease Child 54    Social History   Socioeconomic History   Marital status: Widowed    Spouse name: Not on file   Number of children: Not on file   Years of education: Not on file   Highest education level: Not on file  Occupational History   Not on file  Tobacco Use   Smoking status: Never   Smokeless tobacco: Never  Substance and Sexual Activity   Alcohol use: No   Drug use: No   Sexual activity: Not Currently  Other Topics Concern   Not on file  Social History Narrative    Widowed. Lives at [facility]. Son died in the [place] War and she has not slept well since then.   Admitted to [facility] 01/30/17   Never smoked   Alcohol none   Full Code   Social Determinants of Health   Financial Resource Strain: Not on file  Food Insecurity: Not on file  Transportation Needs: Not on file  Physical Activity: Not on file  Stress: Not on file  Social Connections: Not on file  Intimate Partner Violence: Not on file    Review of Systems  Gastrointestinal:  Positive for abdominal pain.       Resolves with tylenol  All other systems reviewed and are negative.  PHYSICAL EXAMINATION:    BP (!) 110/58 (BP Location: Right Arm, Patient Position: Sitting, Cuff Size: Normal)   Pulse 72   Resp 14   Ht 5\' 2"  (1.575 m)   Wt 132 lb (59.9 kg)   BMI 24.14 kg/m     General appearance: alert, cooperative and appears stated age   Abdomen: 20 week size, smooth mobile mass.   Pelvic: External genitalia:  no lesions              Urethra:  normal appearing urethra with no masses, tenderness or lesions              Bartholins and Skenes: normal                 Vagina: normal appearing vagina with normal color and discharge, no lesions  Cervix: Cervix is flush with the vagina.                 Bimanual Exam:  Uterus:  20 week size mass.               Adnexa: not palpated separately from the midline mass.              Rectal exam: yes.  Confirms.              Anus:  normal sphincter tone, no lesions  Chaperone was present for exam:  Raquel Sarna, RN.  Pelvic Ultrasound today: Uterus 8.46 x 8.16 x 7.12 cm, with volume 287.780 cm3. Uterus appears enlarged with internal mass and cystic/solid components.  Bilateral ovaries not identified.  No adnexal masses.  No free fluid.  ASSESSMENT  Pelvic mass.  I suspect this is an enlarged uterus due to cervical stenosis and possible bleeding due to uterine pathology.  Ovaries are not identified on  ultrasound.  Atrial fibrillation.  On Eliquis.   PLAN  We reviewed the ultrasound imaged and report. Check CA125.  I am recommending a referral to GYN Oncology for further evaluation and treatment.    An After Visit Summary was printed and given to the patient.  30 min  total time was spent for this patient encounter, including preparation, face-to-face counseling with the patient, coordination of care, and documentation of the encounter.

## 2021-09-09 NOTE — Telephone Encounter (Signed)
Please assist in making a referral to [place] at [facility].    Patient has a pelvic mass to the level of her umbilicus that is complex in nature on ultrasound.  A CA125 was drawn today.

## 2021-09-10 ENCOUNTER — Telehealth: Payer: Self-pay | Admitting: *Deleted

## 2021-09-10 LAB — CA 125: CA 125: 53 U/mL — ABNORMAL HIGH (ref <35)

## 2021-09-10 NOTE — Telephone Encounter (Signed)
Referral placed at [facility] they will call to schedule. 

## 2021-09-10 NOTE — Telephone Encounter (Signed)
Attempted to reach the patient's son, no answer and no voicemail

## 2021-09-13 ENCOUNTER — Telehealth: Payer: Self-pay

## 2021-09-13 ENCOUNTER — Other Ambulatory Visit: Payer: Self-pay

## 2021-09-13 DIAGNOSIS — R1900 Intra-abdominal and pelvic swelling, mass and lump, unspecified site: Secondary | ICD-10-CM

## 2021-09-13 NOTE — Telephone Encounter (Signed)
No PA required.  Encounter closed.

## 2021-09-13 NOTE — Telephone Encounter (Signed)
Please contact patient and her son, Patrick Jupiter, in follow up to her visit and her CA125 which is mildly elevated.  I spoke with Dr. Berline Lopes, who is recommending an MRI before having consultation.   Please schedule an abdominal/pelvic MRI with contrast.  The patient does have a history of a hip replacement.  I see she has had an MRI of the spine in the past.

## 2021-09-13 NOTE — Telephone Encounter (Signed)
MRI of abd and pelvis with contrast is scheduled for 09/24/21. Sent to [facility] to check on ins auth.

## 2021-09-13 NOTE — Telephone Encounter (Signed)
Order placed for MRI abd with contrast and MRI pelvis with contrast. Spoke with son Patrick Jupiter and provided [place] phone number to call and arrange and answer their screening questions.  I will follow for the appt date/time.

## 2021-09-14 NOTE — Telephone Encounter (Signed)
Per Dr.Silva, I spoke with Dr. Berline Lopes, who is recommending an MRI before having consultation.  patient scheduled for MRI on 09/24/21.

## 2021-09-15 NOTE — Telephone Encounter (Signed)
Called both on 10/3 and today, left the patient's son a message to call the office back to schedule appt

## 2021-09-16 DIAGNOSIS — R5383 Other fatigue: Secondary | ICD-10-CM | POA: Diagnosis not present

## 2021-09-16 DIAGNOSIS — M810 Age-related osteoporosis without current pathological fracture: Secondary | ICD-10-CM | POA: Diagnosis not present

## 2021-09-16 DIAGNOSIS — E559 Vitamin D deficiency, unspecified: Secondary | ICD-10-CM | POA: Diagnosis not present

## 2021-09-21 DIAGNOSIS — L814 Other melanin hyperpigmentation: Secondary | ICD-10-CM | POA: Diagnosis not present

## 2021-09-21 DIAGNOSIS — L821 Other seborrheic keratosis: Secondary | ICD-10-CM | POA: Diagnosis not present

## 2021-09-21 DIAGNOSIS — L570 Actinic keratosis: Secondary | ICD-10-CM | POA: Diagnosis not present

## 2021-09-21 NOTE — Telephone Encounter (Signed)
Called today and spoke with the patient's son and scheduled a new patient appt on 10/19 at 9:45 am with Dr Berline Lopes.  Patient's son given an arrival time of 0:10 am and the policy for masks/visitors

## 2021-09-24 ENCOUNTER — Ambulatory Visit
Admission: RE | Admit: 2021-09-24 | Discharge: 2021-09-24 | Disposition: A | Discharge status: Home / Self Care | Payer: Medicare Other | Source: Ambulatory Visit | Attending: Obstetrics and Gynecology | Admitting: Obstetrics and Gynecology

## 2021-09-24 DIAGNOSIS — R1900 Intra-abdominal and pelvic swelling, mass and lump, unspecified site: Secondary | ICD-10-CM | POA: Diagnosis not present

## 2021-09-24 DIAGNOSIS — K838 Other specified diseases of biliary tract: Secondary | ICD-10-CM | POA: Diagnosis not present

## 2021-09-24 DIAGNOSIS — K8020 Calculus of gallbladder without cholecystitis without obstruction: Secondary | ICD-10-CM | POA: Diagnosis not present

## 2021-09-24 DIAGNOSIS — K5730 Diverticulosis of large intestine without perforation or abscess without bleeding: Secondary | ICD-10-CM | POA: Diagnosis not present

## 2021-09-24 MED ORDER — GADOBENATE DIMEGLUMINE 529 MG/ML IV SOLN
11.0000 mL | Freq: Once | INTRAVENOUS | Status: AC | PRN
  Administered 2021-09-24: 11 mL via INTRAVENOUS

## 2021-09-27 ENCOUNTER — Encounter: Payer: Self-pay | Admitting: Gynecologic Oncology

## 2021-09-28 NOTE — Progress Notes (Signed)
GYNECOLOGIC ONCOLOGY NEW PATIENT CONSULTATION   Patient Name: Leah Holland  Patient Age: 87 y.o. Date of Service: 09/28/21 Referring Provider: Dr. Josefa Half   Primary Care Provider: [name], MD Consulting Provider: [name], MD   Assessment/Plan:  Postmenopausal patient with symptomatic complex pelvic mass.   I had a very long and detailed conversation with the patient and children accompanying her today. We looked at her MRI images together. My suspicion for cancer is low, but her pelvic mass is not simple and we reviewed that the only way to make a definitive diagnosis is to proceed with surgical excision.   We also discussed her mildly elevated CA- 125. I reviewed that this is not diagnostic and as a tool that is used prior to surgery once the decision for surgery has been made to help decide who should perform the surgery. Up to half of women with early stage ovarian cancer can have a normal CA-125, and there are many benign disease processes that can cause it to be elevated.   The patient is symptomatic, and I think, if only from my symptom standpoint, consideration of surgery is important. The patient is relatively healthy, especially in light of her advanced age.  If this were to be a cancer, we discussed what additional treatment she would be interested in. She has previously told her children that she would not want chemotherapy, and she voices this again today. Patient brought her healthcare directive which we made a copy of to scan into our medical record. Since she would defer any additional cancer directed treatment, I think that we could limit our surgery to removal of her fallopian tubes And ovaries, with consideration of hysterectomy. I think that staging would have somewhat limited value in the circumstance and would increase the morbidity of surgery.  The patient and her family asked excellent questions today. She would like some time to discuss the  possibility of surgery with all of her children. She is seeing her primary care provider next week and would like to talk to that doctor as well. We will plan to schedule her for a visit to come back within the next two weeks. At that visit, if she has decided she would like to proceed with surgery, we will schedule a date for surgery, I will perform a pelvic exam, and she will have her preoperative teaching.   A copy of this note was sent to the patient's referring provider.   85 minutes of total time was spent for this patient encounter, including preparation, face-to-face counseling with the patient and coordination of care, and documentation of the encounter.   [name], MD  Division of Gynecologic Oncology  Department of Obstetrics and Gynecology  [facility]  ___________________________________________  Chief Complaint: No chief complaint on file.   History of Present Illness:  Leah Holland is a 87 y.o. y.o. female who is seen in consultation at the request of Dr. [name] for an evaluation of complex adnexal mass.  Patient presents today with her daughter and son.  Son is a retired Lexicographer.  Patient was seen initially for urinary incontinence in July.  Her symptoms started about a week before her visit with Dr. [name].  She was found to have a pelvic mass and underwent CT imaging that showed some bladder findings.  She was then referred to a urologist and underwent cystoscopy.  Cystoscopy was overall normal.  She subsequently had a pelvic ultrasound and MRI which  both suggest findings of an adnexal mass.  Patient endorses having intermittent pain.  If she uses Tylenol when she initially feels the pain come on, she will have relief with just the Tylenol.  If she waits, then she will have worsening pain and this requires her to take ibuprofen.  Pain starts in her mid lower abdomen and will sometimes radiate down to her right pelvis and towards her  right leg.  This occurs most days but not every day.  She describes the pain as "hurting" and if it worsens notes that it feels like throbbing pain.  She endorses a good appetite without nausea or emesis.  She has constipation intermittently which is her baseline and uses senna and MiraLAX for this.  She describes at least several years of stress urinary incontinence.  When she saw Dr. Roni Bread initially for this, she was told to start vaginal estrogen.  Due to the cost, she opted not to do this.  She had some pelvic floor physical therapy which has helped with her symptoms.  Medical history is notable for hypertension, atrial fibrillation.  She has had bilateral hip surgery.  The time of her last hip surgery, culture was positive although no findings of osteomyelitis were found.  She is now on antibiotics (Bactrim) for the rest of her life.  She is never had any abdominal surgery.  Her brother had colon cancer and the patient got colonoscopies every 3 years until the 2000's.  She lives at friend's home in [place].  She denies any tobacco or alcohol use.  ECHO from 01/2021:  1. Left ventricular ejection fraction, by estimation, is 65 to 70%. Left  ventricular ejection fraction by 3D volume is 68 %. The left ventricle has  normal function. The left ventricle has no regional wall motion  abnormalities. Left ventricular diastolic   function could not be evaluated. There is the interventricular septum is  flattened in systole and diastole, consistent with right ventricular  pressure and volume overload.   2. Right ventricular systolic function is mildly reduced. The right  ventricular size is moderately enlarged. There is moderately elevated  pulmonary artery systolic pressure. The estimated right ventricular  systolic pressure is 92.1 mmHg.   3. Left atrial size was severely dilated.   4. Right atrial size was severely dilated.   5. The mitral valve is degenerative. Mild to moderate mitral valve   regurgitation. No evidence of mitral stenosis.   6. The tricuspid valve is abnormal. Tricuspid valve regurgitation is  moderate to severe.   7. The aortic valve is tricuspid. There is mild calcification of the  aortic valve. There is mild thickening of the aortic valve. Aortic valve  regurgitation is not visualized. Mild aortic valve sclerosis is present,  with no evidence of aortic valve  stenosis.   8. The inferior vena cava is normal in size with <50% respiratory  variability, suggesting right atrial pressure of 8 mmHg.   PAST MEDICAL HISTORY:  Past Medical History:  Diagnosis Date   Anxiety attack    "take RX prn" (08/13/2013)   Arthritis    "probably" (08/13/2013)   Atrial fibrillation ([place])    new onset/pt report 08/13/2013   Bladder wall thickening 07/01/2021   CT scan in Epic   Bronchitis 12/2011   Cholelithiasis 07/01/2021   CT in Epic.   Common bile duct dilation 07/01/2021   CT in Epic:  13 mm.   Complication of anesthesia    Compression fracture of first lumbar  vertebra ([place]) 07/01/2021   CT in Epic.   Depression    Femur fracture ([facility])    left proximal femur non-union   GERD (gastroesophageal reflux disease)    H/O hiatal hernia    Hardware complicating wound infection ([place]) 08/22/2016   History of blood transfusion    "w/both knee OR's" (08/13/2013)   Hyperlipidemia    Hyperlipidemia    Hypertension    Hypertensive urgency 10/22/2018   Hypothyroidism    takes synthroid   Insomnia    Intertrochanteric fracture of right hip ([place]) 07/30/2015   Neuromuscular disorder ([facility])    carpal tunnel in right hand   Periprosthetic fracture around internal prosthetic right hip joint ([place]), nonunion basicervical femoral neck with broken hardware and subtrochanteric fracture 03/16/2016   PONV (postoperative nausea and vomiting)    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 Gwenlyn Found, cardiologist 1 x year [phone]   Tendon dysfunction 08/22/2016   Wears hearing aid    HOH     PAST SURGICAL HISTORY:  Past Surgical History:  Procedure Laterality Date   2D ECHOCARDIOGRAM  12/07/2004   EF 48%, moderate pulmonary hypertension   BREAST BIOPSY Left ?1970's   CARDIOVASCULAR STRESS TEST  12/07/11   CARDIOVASCULAR STRESS TEST  12/07/2011   LEXISCAN, normal scan, no significant wall abnormalities noted   CARPAL TUNNEL RELEASE Right ~ 2005   CATARACT EXTRACTION, BILATERAL Bilateral 2000   DILATION AND CURETTAGE OF UTERUS  1950's   DOPPLER ECHOCARDIOGRAPHY  12/07/11   EYE SURGERY     FEMUR IM NAIL Right 08/01/2015   Procedure: INTRAMEDULLARY (IM) NAIL FEMORAL;  Surgeon: Marchia Bond, MD;  Location: [facility];  Service: Orthopedics;  Laterality: Right;   HARDWARE REMOVAL Right 03/16/2016   Procedure: HARDWARE REMOVAL;  Surgeon: Marchia Bond, MD;  Location: [facility];  Service: Orthopedics;  Laterality: Right;   INTRAMEDULLARY (IM) NAIL INTERTROCHANTERIC Left 01/26/2017   Procedure: INTRAMEDULLARY (IM) NAIL INTERTROCHANTRIC;  Surgeon: Leandrew Koyanagi, MD;  Location: [facility];  Service: Orthopedics;  Laterality: Left;   JOINT REPLACEMENT     bilateral knee replacement   SKIN CANCER EXCISION  2000's   "off my nose" (08/13/2013)   TOTAL HIP ARTHROPLASTY Right 03/16/2016   Procedure: TOTAL HIP ARTHROPLASTY;  Surgeon: Marchia Bond, MD;  Location: [facility];  Service: Orthopedics;  Laterality: Right;   TOTAL HIP ARTHROPLASTY Left 06/09/2017   Procedure: REMOVE LEFT HIP INTRAMEDULLARY NAIL AND CONVERT TO LEFT CEMENTED HEMI HIP ARTHROPLASTY;  Surgeon: Leandrew Koyanagi, MD;  Location: [facility];  Service: Orthopedics;  Laterality: Left;   TOTAL KNEE ARTHROPLASTY Right 2005   TOTAL KNEE ARTHROPLASTY  03/02/2012   Procedure: TOTAL KNEE ARTHROPLASTY;  Surgeon: Yvette Rack., MD;  Location: [facility];  Service:  Orthopedics;  Laterality: Left;    OB/GYN HISTORY:  OB History  Gravida Para Term Preterm AB Living  3 3 3     2   SAB IAB Ectopic Multiple Live Births          3    # Outcome Date GA Lbr Len/2nd Weight Sex Delivery Anes PTL Lv  3 Term           2 Term           1  Term             No LMP recorded. Patient is postmenopausal.  Age at menarche: 4 Age at menopause: 42 Hx of HRT: Denies Hx of STDs: Denies Last pap: years ago History of abnormal pap smears: Denies   SCREENING STUDIES:  Last mammogram: 2007  Last colonoscopy: 2006  MEDICATIONS: Outpatient Encounter Medications as of 09/29/2021  Medication Sig   acetaminophen (TYLENOL) 325 MG tablet Take 650 mg by mouth every 6 (six) hours as needed for moderate pain.    chlorthalidone (HYGROTON) 25 MG tablet TAKE 1 TABLET ONCE DAILY.   Cholecalciferol 2000 units CAPS Take 2,000 Units by mouth every evening.   denosumab (PROLIA) 60 MG/ML SOLN injection Inject 60 mg into the skin every 6 (six) months. Administer in upper arm, thigh, or abdomen   diltiazem (CARDIZEM CD) 180 MG 24 hr capsule TAKE 1 CAPSULE DAILY.   ELIQUIS 2.5 MG TABS tablet TAKE 1 TABLET BY MOUTH TWICE DAILY.   Flaxseed, Linseed, (FLAXSEED OIL) 1000 MG CAPS Take 1,000 mg by mouth 2 (two) times daily.    furosemide (LASIX) 20 MG tablet Take 20 mg by mouth daily as needed.    levothyroxine (SYNTHROID) 112 MCG tablet Take 112 mcg by mouth daily before breakfast.    magnesium hydroxide (MILK OF MAGNESIA) 400 MG/5ML suspension Take 30 mLs by mouth every other day.   metoprolol (LOPRESSOR) 50 MG tablet Take 50 mg by mouth 2 (two) times daily.   Multiple Vitamins-Minerals (PRESERVISION AREDS 2+MULTI VIT PO) Take by mouth 2 (two) times daily. 250-90-40-1 mg capsule   potassium chloride SA (KLOR-CON) 20 MEQ tablet Take 20 mEq by mouth daily as needed.   senna (SENOKOT) 8.6 MG tablet Take 2 tablets by mouth at bedtime.   sulfamethoxazole-trimethoprim (BACTRIM) 400-80 MG  tablet Take 1 tablet by mouth 2 (two) times daily.   temazepam (RESTORIL) 30 MG capsule Take 1 capsule (30 mg total) by mouth at bedtime as needed for sleep.   amoxicillin (AMOXIL) 500 MG capsule Take 1 capsule (500 mg total) by mouth 2 (two) times daily. (Patient not taking: Reported on 09/27/2021)   DULoxetine (CYMBALTA) 60 MG capsule Take 60 mg by mouth daily.  (Patient not taking: Reported on 09/27/2021)   esomeprazole (NEXIUM) 20 MG capsule  (Patient not taking: Reported on 09/27/2021)   No facility-administered encounter medications on file as of 09/29/2021.    ALLERGIES:  Allergies  Allergen Reactions   Levaquin [Levofloxacin] Other (See Comments)    tendinopathy   Morphine And Related Other (See Comments)    Made pt "wild"     FAMILY HISTORY:  Family History  Problem Relation Age of Onset   Arrhythmia Mother    Heart disease Mother    Stroke Father 89   Arrhythmia Sister    Heart disease Sister    Heart disease Sister    Stroke Sister    Diabetes Sister    Heart disease Brother    Heart disease Brother    Cancer Brother        Colon cancer   Arrhythmia Brother    Heart disease Brother    Heart disease Child 71   Heart disease Child 8   Breast cancer Neg Hx    Ovarian cancer Neg Hx    Endometrial cancer Neg Hx    Pancreatic cancer Neg Hx    Prostate cancer Neg Hx      SOCIAL HISTORY:  Social Connections: Not on file  REVIEW OF SYSTEMS:  Pertinent positives include abdominal pain and fatigue. Denies appetite changes, fevers, chills, unexplained weight changes. Denies hearing loss, neck lumps or masses, mouth sores, ringing in ears or voice changes. Denies cough or wheezing.  Denies shortness of breath. Denies chest pain or palpitations. Denies leg swelling. Denies abdominal distention, blood in stools, constipation, diarrhea, nausea, vomiting, or early satiety. Denies pain with intercourse, dysuria, frequency, hematuria. Denies hot flashes, pelvic  pain, vaginal bleeding or vaginal discharge.   Denies joint pain, back pain or muscle pain/cramps. Denies itching, rash, or wounds. Denies dizziness, headaches, numbness or seizures. Denies swollen lymph nodes or glands, denies easy bruising or bleeding. Denies anxiety, depression, confusion, or decreased concentration.  Physical Exam:  Vital Signs for this encounter:  Blood pressure (!) 154/82, pulse 69, temperature 98.3 F (36.8 C), temperature source Oral, resp. rate 16, height 5\' 2"  (1.575 m), weight 130 lb 3.2 oz (59.1 kg), SpO2 95 %. Body mass index is 23.81 kg/m. General: Alert, oriented, no acute distress.  HEENT: Normocephalic, atraumatic. Sclera anicteric.  Chest: Unlabored breathing on room air.  LABORATORY AND RADIOLOGIC DATA:  Outside medical records were reviewed to synthesize the above history, along with the history and physical obtained during the visit.   Lab Results  Component Value Date   WBC 8.5 01/19/2021   HGB 14.3 01/19/2021   HCT 42.7 01/19/2021   PLT 281 01/19/2021   GLUCOSE 91 06/25/2021   CHOL 116 04/26/2013   TRIG 56 04/26/2013   HDL 45 04/26/2013   LDLCALC 60 04/26/2013   ALT 17 01/19/2021   AST 18 01/19/2021   NA 135 06/25/2021   K 4.2 06/25/2021   CL 96 (L) 06/25/2021   CREATININE 0.99 (H) 06/25/2021   BUN 13 06/25/2021   CO2 30 06/25/2021   TSH 1.197 10/02/2020   INR 1.03 06/09/2017   HGBA1C 5.9 08/10/2015   CA-125 on 9/29: 53  CT A/P on 07/01/21: IMPRESSION: Mild diffuse bladder wall thickening, which may be due to cystitis or neurogenic bladder.   Focal wall thickening involving the right superior bladder wall. Bladder carcinoma cannot be excluded. Recommend cystoscopy for further evaluation.   No evidence of metastatic disease.   Cholelithiasis. No radiographic evidence of cholecystitis.   Biliary ductal dilatation, with common bile duct measuring 13 mm. No etiology visualized by CT. Suggest correlation with liver  function tests, and consider abdomen MRI and MRCP without and with contrast if clinically warranted.   Colonic diverticulosis. No radiographic evidence of diverticulitis.   Pelvic ultrasound on 09/23/21: Uterus 8.46 x 8.16 x 7.12 cm, with volume 287.780 cm3. Uterus appears enlarged with internal mass and cystic/solid components.  Bilateral ovaries not identified.  No adnexal masses.  No free fluid.   Impression:  Cystic pelvic mass.  Possible uterine enlargement versus ovarian pathology.   MRI A/P on 10/14: IMPRESSION: 1. There is a thick-walled cystic lesion situated in the midline pelvis, which appears to arise from the right ovary and measures approximately 8.5 x 7.5 x 8.1 cm. There is likely some layering debris within this lesion as well as a thick, contrast enhancing peripheral nodular component at the superior and right aspect measuring approximately 5.0 x 2.3 x 4.7 cm. This is highly concerning for a cystic ovarian neoplasm. 2. No MR evidence of lymphadenopathy or metastatic disease in the abdomen or pelvis. 3. Status post hysterectomy. 4. Cholelithiasis. 5. Unchanged common bile duct dilatation up to 1.3 cm, of uncertain significance. Correlate for clinical evidence  of cholestasis. 6. Sigmoid diverticulosis.

## 2021-09-29 ENCOUNTER — Inpatient Hospital Stay: Payer: Medicare Other | Attending: Gynecologic Oncology | Admitting: Gynecologic Oncology

## 2021-09-29 ENCOUNTER — Other Ambulatory Visit: Payer: Self-pay

## 2021-09-29 ENCOUNTER — Encounter: Payer: Self-pay | Admitting: Gynecologic Oncology

## 2021-09-29 VITALS — BP 154/82 | HR 69 | Temp 98.3°F | Resp 16 | Ht 62.0 in | Wt 130.2 lb

## 2021-09-29 DIAGNOSIS — I10 Essential (primary) hypertension: Secondary | ICD-10-CM | POA: Insufficient documentation

## 2021-09-29 DIAGNOSIS — K219 Gastro-esophageal reflux disease without esophagitis: Secondary | ICD-10-CM | POA: Insufficient documentation

## 2021-09-29 DIAGNOSIS — R109 Unspecified abdominal pain: Secondary | ICD-10-CM | POA: Insufficient documentation

## 2021-09-29 DIAGNOSIS — I4891 Unspecified atrial fibrillation: Secondary | ICD-10-CM | POA: Insufficient documentation

## 2021-09-29 DIAGNOSIS — R971 Elevated cancer antigen 125 [CA 125]: Secondary | ICD-10-CM | POA: Insufficient documentation

## 2021-09-29 DIAGNOSIS — R1900 Intra-abdominal and pelvic swelling, mass and lump, unspecified site: Secondary | ICD-10-CM

## 2021-09-29 DIAGNOSIS — E039 Hypothyroidism, unspecified: Secondary | ICD-10-CM | POA: Insufficient documentation

## 2021-09-29 DIAGNOSIS — E785 Hyperlipidemia, unspecified: Secondary | ICD-10-CM | POA: Insufficient documentation

## 2021-09-29 DIAGNOSIS — Z79899 Other long term (current) drug therapy: Secondary | ICD-10-CM | POA: Insufficient documentation

## 2021-09-29 DIAGNOSIS — Z780 Asymptomatic menopausal state: Secondary | ICD-10-CM | POA: Insufficient documentation

## 2021-09-29 NOTE — Patient Instructions (Signed)
Plan to meet with your primary care as scheduled and follow up in two weeks with Dr. Berline Lopes. Please call the office for any needs or concerns at [phone].

## 2021-09-30 ENCOUNTER — Telehealth: Payer: Self-pay | Admitting: *Deleted

## 2021-09-30 DIAGNOSIS — I700 Atherosclerosis of aorta: Secondary | ICD-10-CM

## 2021-09-30 NOTE — Telephone Encounter (Signed)
   [facility] Pre-operative Risk Assessment    Patient Name: DEIDRE CARINO  DOB: 27-Aug-1922 MRN: [id number]  HEARTCARE STAFF:  - IMPORTANT!!!!!! Under Visit Info/Reason for Call, type in Other and utilize the format Clearance MM/DD/YY or Clearance TBD. Do not use dashes or single digits. - Please review there is not already an duplicate clearance open for this procedure. - If request is for dental extraction, please clarify the # of teeth to be extracted. - If the patient is currently at the dentist's office, call Pre-Op Callback Staff (MA/nurse) to input urgent request.  - If the patient is not currently in the dentist office, please route to the Pre-Op pool.  Request for surgical clearance:  What type of surgery is being performed? ROBOTIC  ASSIST LAPAROSCOPIC BILATERAL SALPINGO OOPHORECTOMY POSSIBLE HYSTERECTOMY   When is this surgery scheduled? TBD  What type of clearance is required (medical clearance vs. Pharmacy clearance to hold med vs. Both)? BOTH  Are there any medications that need to be held prior to surgery and how long? [place] name and name of physician performing surgery? GYN ONCOLOGY DR [name]  What is the office phone number? [phone]   7.   What is the office fax number? [phone]  0.   Anesthesia type (None, local, MAC, general) ? GENERAL    Devra Dopp 09/30/2021, 4:34 PM  _________________________________________________________________   (provider comments below)

## 2021-09-30 NOTE — Telephone Encounter (Signed)
Fax records and surgical optimization form to the patient's PCP and cardiology office

## 2021-09-30 NOTE — Telephone Encounter (Signed)
Patient with diagnosis of Afib on Eliquis for anticoagulation.    Procedure: robotic assisted laparoscopic bilateral salpingo oophorectomy / potential hysterectomy Date of procedure: TBD   CHA2DS2-VASc Score = 6  This indicates a 9.7% annual risk of stroke. The patient's score is based upon: CHF History: 0 HTN History: 1 Diabetes History: 1 Stroke History: 0 Vascular Disease History: 1 Age Score: 2 Gender Score: 1   CrCl 30 mL/min Platelet count 281 K  Per office protocol, patient can hold Eliquis for 2-3 days prior to procedure.

## 2021-10-01 NOTE — Telephone Encounter (Signed)
Primary Cardiologist:Leah Gwenlyn Found, MD  Chart reviewed as part of pre-operative protocol coverage. Because of Leah Holland past medical history and time since last visit, he/she will require a follow-up visit in order to better assess preoperative cardiovascular risk.  Pre-op covering staff: - Please schedule appointment and call patient to inform them. - Please contact requesting surgeon's office via preferred method (i.e, phone, fax) to inform them of need for appointment prior to surgery.  If applicable, this message will also be routed to pharmacy pool and/or primary cardiologist for input on holding anticoagulant/antiplatelet agent as requested below so that this information is available at time of patient's appointment.   Deberah Pelton, NP  10/01/2021, 7:19 AM

## 2021-10-01 NOTE — Telephone Encounter (Signed)
Spoke with pt son's Patrick [place]) DPR on file and was able to schedule pt for 11/9 at 1:55 pm with Coletta Memos, NP. [name] verbalized understanding and thanked me for the call. Will fax pre-op recommendations to request surgeon's office.

## 2021-10-07 ENCOUNTER — Telehealth: Payer: Self-pay | Admitting: *Deleted

## 2021-10-07 DIAGNOSIS — E785 Hyperlipidemia, unspecified: Secondary | ICD-10-CM | POA: Diagnosis not present

## 2021-10-07 DIAGNOSIS — G4700 Insomnia, unspecified: Secondary | ICD-10-CM | POA: Diagnosis not present

## 2021-10-07 DIAGNOSIS — Z23 Encounter for immunization: Secondary | ICD-10-CM | POA: Diagnosis not present

## 2021-10-07 DIAGNOSIS — M810 Age-related osteoporosis without current pathological fracture: Secondary | ICD-10-CM | POA: Diagnosis not present

## 2021-10-07 DIAGNOSIS — Z01818 Encounter for other preprocedural examination: Secondary | ICD-10-CM | POA: Diagnosis not present

## 2021-10-07 DIAGNOSIS — I10 Essential (primary) hypertension: Secondary | ICD-10-CM | POA: Diagnosis not present

## 2021-10-07 DIAGNOSIS — I4891 Unspecified atrial fibrillation: Secondary | ICD-10-CM | POA: Diagnosis not present

## 2021-10-07 DIAGNOSIS — E039 Hypothyroidism, unspecified: Secondary | ICD-10-CM | POA: Diagnosis not present

## 2021-10-07 DIAGNOSIS — R739 Hyperglycemia, unspecified: Secondary | ICD-10-CM | POA: Diagnosis not present

## 2021-10-07 DIAGNOSIS — R1900 Intra-abdominal and pelvic swelling, mass and lump, unspecified site: Secondary | ICD-10-CM | POA: Diagnosis not present

## 2021-10-07 NOTE — Telephone Encounter (Signed)
Returned the patient's son's call regarding her appt for Monday. Son wanted to make sure it was ok to keep appt since the cardiology appt is 11/9. Explained to keep the appt on Monday

## 2021-10-11 ENCOUNTER — Inpatient Hospital Stay (HOSPITAL_BASED_OUTPATIENT_CLINIC_OR_DEPARTMENT_OTHER): Payer: Medicare Other | Admitting: Gynecologic Oncology

## 2021-10-11 ENCOUNTER — Encounter: Payer: Self-pay | Admitting: Gynecologic Oncology

## 2021-10-11 ENCOUNTER — Other Ambulatory Visit: Payer: Self-pay

## 2021-10-11 VITALS — BP 147/60 | HR 60 | Temp 98.0°F | Resp 16 | Ht 62.0 in | Wt 130.4 lb

## 2021-10-11 DIAGNOSIS — R971 Elevated cancer antigen 125 [CA 125]: Secondary | ICD-10-CM | POA: Diagnosis not present

## 2021-10-11 DIAGNOSIS — E785 Hyperlipidemia, unspecified: Secondary | ICD-10-CM | POA: Diagnosis not present

## 2021-10-11 DIAGNOSIS — I10 Essential (primary) hypertension: Secondary | ICD-10-CM | POA: Diagnosis not present

## 2021-10-11 DIAGNOSIS — I4891 Unspecified atrial fibrillation: Secondary | ICD-10-CM | POA: Diagnosis not present

## 2021-10-11 DIAGNOSIS — Z780 Asymptomatic menopausal state: Secondary | ICD-10-CM | POA: Diagnosis not present

## 2021-10-11 DIAGNOSIS — R1900 Intra-abdominal and pelvic swelling, mass and lump, unspecified site: Secondary | ICD-10-CM

## 2021-10-11 DIAGNOSIS — Z79899 Other long term (current) drug therapy: Secondary | ICD-10-CM | POA: Diagnosis not present

## 2021-10-11 DIAGNOSIS — R109 Unspecified abdominal pain: Secondary | ICD-10-CM | POA: Diagnosis not present

## 2021-10-11 DIAGNOSIS — E039 Hypothyroidism, unspecified: Secondary | ICD-10-CM | POA: Diagnosis not present

## 2021-10-11 DIAGNOSIS — K219 Gastro-esophageal reflux disease without esophagitis: Secondary | ICD-10-CM | POA: Diagnosis not present

## 2021-10-11 MED ORDER — TRAMADOL HCL 50 MG PO TABS: 50.0000 mg | ORAL_TABLET | Freq: Four times a day (QID) | ORAL | 0 refills | Status: DC | PRN

## 2021-10-11 NOTE — H&P (View-Only) (Signed)
Gynecologic Oncology Return Clinic Visit  10/11/21  Reason for Visit: Surgery planning  Treatment History: Patient was seen initially for urinary incontinence in July.  Her symptoms started about a week before her visit with Dr. [name].  She was found to have a pelvic mass and underwent CT imaging that showed some bladder findings.  She was then referred to a urologist and underwent cystoscopy.  Cystoscopy was overall normal.  She subsequently had a pelvic ultrasound and MRI which both suggest findings of an adnexal mass.  CA-125 on 9/29: 53  MRI A/P on 10/14: 1. There is a thick-walled cystic lesion situated in the midline pelvis, which appears to arise from the right ovary and measures approximately 8.5 x 7.5 x 8.1 cm. There is likely some layering debris within this lesion as well as a thick, contrast enhancing peripheral nodular component at the superior and right aspect measuring  approximately 5.0 x 2.3 x 4.7 cm. This is highly concerning for a cystic ovarian neoplasm.  Interval History: Patient presents today again with her son to discuss moving forward with surgery.  She has now seen her primary care provider and spoken with her other child and would like to move forward with scheduling surgery.  She continues to have intermittent abdominal pain although overall is becoming more symptomatic.  She takes Tylenol if she starts to feel pain and if things get very bad from a pain standpoint, will use ibuprofen.  Denies other changes since her visit with me recently.  Past Medical/Surgical History: Past Medical History:  Diagnosis Date   Anxiety attack    "take RX prn" (08/13/2013)   Arthritis    "probably" (08/13/2013)   Atrial fibrillation ([place])    new onset/pt report 08/13/2013   Bladder wall thickening 07/01/2021   CT scan in Epic   Bronchitis 12/2011   Cholelithiasis 07/01/2021   CT in Epic.   Common bile duct dilation 07/01/2021   CT in Epic:  13 mm.   Complication of anesthesia     Compression fracture of first lumbar vertebra ([place]) 07/01/2021   CT in Epic.   Depression    Femur fracture (HCC)    left proximal femur non-union   GERD (gastroesophageal reflux disease)    H/O hiatal hernia    Hardware complicating wound infection ([place]) 08/22/2016   History of blood transfusion    "w/both knee OR's" (08/13/2013)   Hyperlipidemia    Hyperlipidemia    Hypertension    Hypertensive urgency 10/22/2018   Hypothyroidism    takes synthroid   Insomnia    Intertrochanteric fracture of right hip ([place]) 07/30/2015   Neuromuscular disorder (HCC)    carpal tunnel in right hand   Periprosthetic fracture around internal prosthetic right hip joint ([place]), nonunion basicervical femoral neck with broken hardware and subtrochanteric fracture 03/16/2016   PONV (postoperative nausea and vomiting)    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 Gwenlyn Found, cardiologist 1 x year [phone]   Tendon dysfunction 08/22/2016   Wears hearing aid    [facility]    Past Surgical History:  Procedure Laterality Date   2D ECHOCARDIOGRAM  12/07/2004   EF 48%, moderate pulmonary hypertension   BREAST BIOPSY Left ?1970's   CARDIOVASCULAR STRESS TEST  12/07/11   CARDIOVASCULAR  STRESS TEST  12/07/2011   LEXISCAN, normal scan, no significant wall abnormalities noted   CARPAL TUNNEL RELEASE Right ~ 2005   CATARACT EXTRACTION, BILATERAL Bilateral 2000   DILATION AND CURETTAGE OF UTERUS  1950's   DOPPLER ECHOCARDIOGRAPHY  12/07/11   EYE SURGERY     FEMUR IM NAIL Right 08/01/2015   Procedure: INTRAMEDULLARY (IM) NAIL FEMORAL;  Surgeon: Marchia Bond, MD;  Location: [place];  Service: Orthopedics;  Laterality: Right;   HARDWARE REMOVAL Right 03/16/2016   Procedure: HARDWARE REMOVAL;  Surgeon: Marchia Bond, MD;  Location: [place];  Service: Orthopedics;  Laterality: Right;   INTRAMEDULLARY (IM) NAIL INTERTROCHANTERIC Left 01/26/2017   Procedure: INTRAMEDULLARY (IM) NAIL INTERTROCHANTRIC;  Surgeon: Leandrew Koyanagi, MD;  Location: [place];  Service: Orthopedics;  Laterality: Left;   JOINT REPLACEMENT     bilateral knee replacement   SKIN CANCER EXCISION  2000's   "off my nose" (08/13/2013)   TOTAL HIP ARTHROPLASTY Right 03/16/2016   Procedure: TOTAL HIP ARTHROPLASTY;  Surgeon: Marchia Bond, MD;  Location: [place];  Service: Orthopedics;  Laterality: Right;   TOTAL HIP ARTHROPLASTY Left 06/09/2017   Procedure: REMOVE LEFT HIP INTRAMEDULLARY NAIL AND CONVERT TO LEFT CEMENTED HEMI HIP ARTHROPLASTY;  Surgeon: Leandrew Koyanagi, MD;  Location: [place];  Service: Orthopedics;  Laterality: Left;   TOTAL KNEE ARTHROPLASTY Right 2005   TOTAL KNEE ARTHROPLASTY  03/02/2012   Procedure: TOTAL KNEE ARTHROPLASTY;  Surgeon: Yvette Rack., MD;  Location: [place];  Service: Orthopedics;  Laterality: Left;    Family History  Problem Relation Age of Onset   Arrhythmia Mother    Heart disease Mother    Stroke Father 70   Arrhythmia Sister    Heart disease Sister    Heart disease Sister    Stroke Sister    Diabetes Sister    Heart disease Brother    Heart disease Brother    Cancer Brother        Colon cancer   Arrhythmia Brother    Heart disease Brother    Heart disease Child 59   Heart disease Child 42   Breast cancer Neg Hx    Ovarian cancer Neg Hx    Endometrial cancer Neg Hx    Pancreatic cancer Neg Hx    Prostate cancer Neg Hx     Social History   Socioeconomic History   Marital status: Widowed    Spouse name: Not on file   Number of children: Not on file   Years of education: Not on file   Highest education level: Not on file  Occupational History   Not on file  Tobacco Use   Smoking status: Never   Smokeless tobacco: Never  Substance and Sexual Activity   Alcohol use: No   Drug use: No   Sexual activity: Not  Currently  Other Topics Concern   Not on file  Social History Narrative   Widowed. Lives at [facility]. Son died in the [place] War and she has not slept well since then.   Admitted to [facility] 01/30/17   Never smoked   Alcohol none   Full Code   Social Determinants of Health   Financial Resource Strain: Not on file  Food Insecurity: Not on file  Transportation Needs: Not on file  Physical Activity: Not on file  Stress: Not on file  Social Connections: Not on file    Current Medications:  Current Outpatient Medications:  acetaminophen (TYLENOL) 325 MG tablet, Take 650 mg by mouth every 6 (six) hours as needed for moderate pain. , Disp: , Rfl:    amoxicillin (AMOXIL) 500 MG capsule, Take 1 capsule (500 mg total) by mouth 2 (two) times daily., Disp: 10 capsule, Rfl: 0   chlorthalidone (HYGROTON) 25 MG tablet, TAKE 1 TABLET ONCE DAILY., Disp: 90 tablet, Rfl: 3   Cholecalciferol 2000 units CAPS, Take 2,000 Units by mouth every evening., Disp: , Rfl:    denosumab (PROLIA) 60 MG/ML SOLN injection, Inject 60 mg into the skin every 6 (six) months. Administer in upper arm, thigh, or abdomen, Disp: , Rfl:    diltiazem (CARDIZEM CD) 180 MG 24 hr capsule, TAKE 1 CAPSULE DAILY., Disp: 90 capsule, Rfl: 2   DULoxetine (CYMBALTA) 60 MG capsule, Take 60 mg by mouth daily., Disp: , Rfl:    ELIQUIS 2.5 MG TABS tablet, TAKE 1 TABLET BY MOUTH TWICE DAILY., Disp: 180 tablet, Rfl: 1   esomeprazole (NEXIUM) 20 MG capsule, , Disp: , Rfl:    Flaxseed, Linseed, (FLAXSEED OIL) 1000 MG CAPS, Take 1,000 mg by mouth 2 (two) times daily. , Disp: , Rfl:    furosemide (LASIX) 20 MG tablet, Take 20 mg by mouth daily as needed. , Disp: , Rfl:    levothyroxine (SYNTHROID) 112 MCG tablet, Take 112 mcg by mouth daily before breakfast. , Disp: , Rfl:    magnesium hydroxide (MILK OF MAGNESIA) 400 MG/5ML suspension, Take 30 mLs by mouth every other day., Disp: , Rfl:    metoprolol (LOPRESSOR) 50 MG  tablet, Take 50 mg by mouth 2 (two) times daily., Disp: , Rfl:    Multiple Vitamins-Minerals (PRESERVISION AREDS 2+MULTI VIT PO), Take by mouth 2 (two) times daily. 824-23-53-6 mg capsule, Disp: , Rfl:    potassium chloride SA (KLOR-CON) 20 MEQ tablet, Take 20 mEq by mouth daily as needed., Disp: , Rfl:    senna (SENOKOT) 8.6 MG tablet, Take 2 tablets by mouth at bedtime., Disp: , Rfl:    sulfamethoxazole-trimethoprim (BACTRIM) 400-80 MG tablet, Take 1 tablet by mouth 2 (two) times daily., Disp: 60 tablet, Rfl: 11   temazepam (RESTORIL) 30 MG capsule, Take 1 capsule (30 mg total) by mouth at bedtime as needed for sleep., Disp: 30 capsule, Rfl: 0   traMADol (ULTRAM) 50 MG tablet, Take 1 tablet (50 mg total) by mouth every 6 (six) hours as needed for severe pain. Do not take and drive, Disp: 10 tablet, Rfl: 0  Review of Systems: Denies appetite changes, fevers, chills, fatigue, unexplained weight changes. Denies hearing loss, neck lumps or masses, mouth sores, ringing in ears or voice changes. Denies cough or wheezing.  Denies shortness of breath. Denies chest pain or palpitations. Denies leg swelling. Denies abdominal distention, blood in stools, constipation, diarrhea, nausea, vomiting, or early satiety. Denies pain with intercourse, dysuria, frequency, hematuria or incontinence. Denies hot flashes, pelvic pain, vaginal bleeding or vaginal discharge.   Denies joint pain, back pain or muscle pain/cramps. Denies itching, rash, or wounds. Denies dizziness, headaches, numbness or seizures. Denies swollen lymph nodes or glands, denies easy bruising or bleeding. Denies anxiety, depression, confusion, or decreased concentration.  Physical Exam: BP (!) 147/60 (BP Location: Right Arm, Patient Position: Sitting)   Pulse 60   Temp 98 F (36.7 C) (Oral)   Resp 16   Ht 5\' 2"  (1.575 m)   Wt 130 lb 6.4 oz (59.1 kg)   SpO2 95%   BMI 23.85 kg/m  General: Alert,  oriented, no acute distress. HEENT:  Normocephalic, atraumatic, sclera anicteric. Chest: Clear to auscultation bilaterally.  No wheezes or rhonchi. Cardiovascular: Regular rate and rhythm, no murmurs. Abdomen: soft, nontender.  Normoactive bowel sounds.  Fullness appreciated in lower abdomen.  No hepatosplenomegaly appreciated.   Extremities: Grossly normal range of motion.  Warm, well perfused.  No edema bilaterally. Skin: No rashes or lesions noted. Lymphatics: No cervical, supraclavicular, or inguinal adenopathy. GU: Normal appearing external genitalia without erythema, excoriation, or lesions.  Speculum exam reveals moderately atrophic vaginal mucosa, no lesions or masses.  Cervix is small and atrophic.  Bimanual exam reveals small mobile uterus.  Separate from the uterus is a smooth mass within the cul-de-sac on the left.  Rectovaginal exam confirms these findings.  Laboratory & Radiologic Studies: None new  Assessment & Plan: Leah Holland is a 87 y.o. woman with a complex pelvic mass who presents today for discussion about surgery.  Patient is meeting with her cardiologist next week for preoperative clearance.  As long as she is cleared for surgery from a cardiac standpoint, she would like to proceed with surgery.  We discussed again the details for surgery.  Given her overall medical comorbidities and age, I recommend proceeding with the least surgery necessary.  My plan would be for excision of the mass and likely BSO.  Patient has previously voiced to me that she would not be interested in adjuvant therapy such as chemotherapy.  Given this, I do not think that it makes sense to keep the patient asleep for frozen section as it will not change management on the day of surgery.  If mass involves the uterus and hysterectomy is felt necessary, then I will perform total hysterectomy at the time of her surgery.   Today we discussed the plan for robotic assisted laparoscopic mass excision, bilateral salpingo-oophorectomy, possible  total hysterectomy, possible mini laparotomy, and any other indicated procedures.  Given her age and comorbidities, we will plan to observe her overnight with discharge the following day if doing well.  We discussed the risks of surgery including bleeding, need for blood transfusion, infection, damage to surrounding structures requiring repair, possible need for future surgery, medical complications related to surgery and anesthesia (VTE, stroke, myocardial infarction, and rarely death).  Perioperative teaching was performed today.  Prescription for tramadol was sent to the patient's pharmacy given worsening of abdominal pain.  42 minutes of total time was spent for this patient encounter, including preparation, face-to-face counseling with the patient and coordination of care, and documentation of the encounter.  [name], MD  Division of Gynecologic Oncology  Department of Obstetrics and Gynecology  [facility]

## 2021-10-11 NOTE — Progress Notes (Signed)
Gynecologic Oncology Return Clinic Visit  10/11/21  Reason for Visit: Surgery planning  Treatment History: Patient was seen initially for urinary incontinence in July.  Her symptoms started about a week before her visit with Dr. [name].  She was found to have a pelvic mass and underwent CT imaging that showed some bladder findings.  She was then referred to a urologist and underwent cystoscopy.  Cystoscopy was overall normal.  She subsequently had a pelvic ultrasound and MRI which both suggest findings of an adnexal mass.  CA-125 on 9/29: 53  MRI A/P on 10/14: 1. There is a thick-walled cystic lesion situated in the midline pelvis, which appears to arise from the right ovary and measures approximately 8.5 x 7.5 x 8.1 cm. There is likely some layering debris within this lesion as well as a thick, contrast enhancing peripheral nodular component at the superior and right aspect measuring  approximately 5.0 x 2.3 x 4.7 cm. This is highly concerning for a cystic ovarian neoplasm.  Interval History: Patient presents today again with her son to discuss moving forward with surgery.  She has now seen her primary care provider and spoken with her other child and would like to move forward with scheduling surgery.  She continues to have intermittent abdominal pain although overall is becoming more symptomatic.  She takes Tylenol if she starts to feel pain and if things get very bad from a pain standpoint, will use ibuprofen.  Denies other changes since her visit with me recently.  Past Medical/Surgical History: Past Medical History:  Diagnosis Date   Anxiety attack    "take RX prn" (08/13/2013)   Arthritis    "probably" (08/13/2013)   Atrial fibrillation ([place])    new onset/pt report 08/13/2013   Bladder wall thickening 07/01/2021   CT scan in Epic   Bronchitis 12/2011   Cholelithiasis 07/01/2021   CT in Epic.   Common bile duct dilation 07/01/2021   CT in Epic:  13 mm.   Complication of anesthesia     Compression fracture of first lumbar vertebra ([place]) 07/01/2021   CT in Epic.   Depression    Femur fracture (HCC)    left proximal femur non-union   GERD (gastroesophageal reflux disease)    H/O hiatal hernia    Hardware complicating wound infection ([place]) 08/22/2016   History of blood transfusion    "w/both knee OR's" (08/13/2013)   Hyperlipidemia    Hyperlipidemia    Hypertension    Hypertensive urgency 10/22/2018   Hypothyroidism    takes synthroid   Insomnia    Intertrochanteric fracture of right hip ([place]) 07/30/2015   Neuromuscular disorder (HCC)    carpal tunnel in right hand   Periprosthetic fracture around internal prosthetic right hip joint ([place]), nonunion basicervical femoral neck with broken hardware and subtrochanteric fracture 03/16/2016   PONV (postoperative nausea and vomiting)    Prosthetic hip infection ([place]) 08/22/2016   Proteus infection 08/22/2016   Serratia infection 11/14/2016   Skin cancer of face 2000's   "one on each side of my face and one on my nose" (08/13/2013)   Swelling of extremity    sees Dr. Gwenlyn Found, cardiologist 1 x year [phone]   Tendon dysfunction 08/22/2016   Wears hearing aid    [facility]    Past Surgical History:  Procedure Laterality Date   2D ECHOCARDIOGRAM  12/07/2004   EF 48%, moderate pulmonary hypertension   BREAST BIOPSY Left ?1970's   CARDIOVASCULAR STRESS TEST  12/07/11   CARDIOVASCULAR  STRESS TEST  12/07/2011   LEXISCAN, normal scan, no significant wall abnormalities noted   CARPAL TUNNEL RELEASE Right ~ 2005   CATARACT EXTRACTION, BILATERAL Bilateral 2000   DILATION AND CURETTAGE OF UTERUS  1950's   DOPPLER ECHOCARDIOGRAPHY  12/07/11   EYE SURGERY     FEMUR IM NAIL Right 08/01/2015   Procedure: INTRAMEDULLARY (IM) NAIL FEMORAL;  Surgeon: Marchia Bond, MD;  Location: [place];  Service: Orthopedics;  Laterality: Right;   HARDWARE REMOVAL Right 03/16/2016   Procedure: HARDWARE REMOVAL;  Surgeon: Marchia Bond, MD;  Location: [place];  Service: Orthopedics;  Laterality: Right;   INTRAMEDULLARY (IM) NAIL INTERTROCHANTERIC Left 01/26/2017   Procedure: INTRAMEDULLARY (IM) NAIL INTERTROCHANTRIC;  Surgeon: Leandrew Koyanagi, MD;  Location: [place];  Service: Orthopedics;  Laterality: Left;   JOINT REPLACEMENT     bilateral knee replacement   SKIN CANCER EXCISION  2000's   "off my nose" (08/13/2013)   TOTAL HIP ARTHROPLASTY Right 03/16/2016   Procedure: TOTAL HIP ARTHROPLASTY;  Surgeon: Marchia Bond, MD;  Location: [place];  Service: Orthopedics;  Laterality: Right;   TOTAL HIP ARTHROPLASTY Left 06/09/2017   Procedure: REMOVE LEFT HIP INTRAMEDULLARY NAIL AND CONVERT TO LEFT CEMENTED HEMI HIP ARTHROPLASTY;  Surgeon: Leandrew Koyanagi, MD;  Location: [place];  Service: Orthopedics;  Laterality: Left;   TOTAL KNEE ARTHROPLASTY Right 2005   TOTAL KNEE ARTHROPLASTY  03/02/2012   Procedure: TOTAL KNEE ARTHROPLASTY;  Surgeon: Yvette Rack., MD;  Location: [place];  Service: Orthopedics;  Laterality: Left;    Family History  Problem Relation Age of Onset   Arrhythmia Mother    Heart disease Mother    Stroke Father 65   Arrhythmia Sister    Heart disease Sister    Heart disease Sister    Stroke Sister    Diabetes Sister    Heart disease Brother    Heart disease Brother    Cancer Brother        Colon cancer   Arrhythmia Brother    Heart disease Brother    Heart disease Child 84   Heart disease Child 60   Breast cancer Neg Hx    Ovarian cancer Neg Hx    Endometrial cancer Neg Hx    Pancreatic cancer Neg Hx    Prostate cancer Neg Hx     Social History   Socioeconomic History   Marital status: Widowed    Spouse name: Not on file   Number of children: Not on file   Years of education: Not on file   Highest education level: Not on file  Occupational History   Not on file  Tobacco Use   Smoking status: Never   Smokeless tobacco: Never  Substance and Sexual Activity   Alcohol use: No   Drug use: No   Sexual activity: Not  Currently  Other Topics Concern   Not on file  Social History Narrative   Widowed. Lives at [facility]. Son died in the [place] War and she has not slept well since then.   Admitted to [facility] 01/30/17   Never smoked   Alcohol none   Full Code   Social Determinants of Health   Financial Resource Strain: Not on file  Food Insecurity: Not on file  Transportation Needs: Not on file  Physical Activity: Not on file  Stress: Not on file  Social Connections: Not on file    Current Medications:  Current Outpatient Medications:  acetaminophen (TYLENOL) 325 MG tablet, Take 650 mg by mouth every 6 (six) hours as needed for moderate pain. , Disp: , Rfl:    amoxicillin (AMOXIL) 500 MG capsule, Take 1 capsule (500 mg total) by mouth 2 (two) times daily., Disp: 10 capsule, Rfl: 0   chlorthalidone (HYGROTON) 25 MG tablet, TAKE 1 TABLET ONCE DAILY., Disp: 90 tablet, Rfl: 3   Cholecalciferol 2000 units CAPS, Take 2,000 Units by mouth every evening., Disp: , Rfl:    denosumab (PROLIA) 60 MG/ML SOLN injection, Inject 60 mg into the skin every 6 (six) months. Administer in upper arm, thigh, or abdomen, Disp: , Rfl:    diltiazem (CARDIZEM CD) 180 MG 24 hr capsule, TAKE 1 CAPSULE DAILY., Disp: 90 capsule, Rfl: 2   DULoxetine (CYMBALTA) 60 MG capsule, Take 60 mg by mouth daily., Disp: , Rfl:    ELIQUIS 2.5 MG TABS tablet, TAKE 1 TABLET BY MOUTH TWICE DAILY., Disp: 180 tablet, Rfl: 1   esomeprazole (NEXIUM) 20 MG capsule, , Disp: , Rfl:    Flaxseed, Linseed, (FLAXSEED OIL) 1000 MG CAPS, Take 1,000 mg by mouth 2 (two) times daily. , Disp: , Rfl:    furosemide (LASIX) 20 MG tablet, Take 20 mg by mouth daily as needed. , Disp: , Rfl:    levothyroxine (SYNTHROID) 112 MCG tablet, Take 112 mcg by mouth daily before breakfast. , Disp: , Rfl:    magnesium hydroxide (MILK OF MAGNESIA) 400 MG/5ML suspension, Take 30 mLs by mouth every other day., Disp: , Rfl:    metoprolol (LOPRESSOR) 50 MG  tablet, Take 50 mg by mouth 2 (two) times daily., Disp: , Rfl:    Multiple Vitamins-Minerals (PRESERVISION AREDS 2+MULTI VIT PO), Take by mouth 2 (two) times daily. 779-39-03-0 mg capsule, Disp: , Rfl:    potassium chloride SA (KLOR-CON) 20 MEQ tablet, Take 20 mEq by mouth daily as needed., Disp: , Rfl:    senna (SENOKOT) 8.6 MG tablet, Take 2 tablets by mouth at bedtime., Disp: , Rfl:    sulfamethoxazole-trimethoprim (BACTRIM) 400-80 MG tablet, Take 1 tablet by mouth 2 (two) times daily., Disp: 60 tablet, Rfl: 11   temazepam (RESTORIL) 30 MG capsule, Take 1 capsule (30 mg total) by mouth at bedtime as needed for sleep., Disp: 30 capsule, Rfl: 0   traMADol (ULTRAM) 50 MG tablet, Take 1 tablet (50 mg total) by mouth every 6 (six) hours as needed for severe pain. Do not take and drive, Disp: 10 tablet, Rfl: 0  Review of Systems: Denies appetite changes, fevers, chills, fatigue, unexplained weight changes. Denies hearing loss, neck lumps or masses, mouth sores, ringing in ears or voice changes. Denies cough or wheezing.  Denies shortness of breath. Denies chest pain or palpitations. Denies leg swelling. Denies abdominal distention, blood in stools, constipation, diarrhea, nausea, vomiting, or early satiety. Denies pain with intercourse, dysuria, frequency, hematuria or incontinence. Denies hot flashes, pelvic pain, vaginal bleeding or vaginal discharge.   Denies joint pain, back pain or muscle pain/cramps. Denies itching, rash, or wounds. Denies dizziness, headaches, numbness or seizures. Denies swollen lymph nodes or glands, denies easy bruising or bleeding. Denies anxiety, depression, confusion, or decreased concentration.  Physical Exam: BP (!) 147/60 (BP Location: Right Arm, Patient Position: Sitting)   Pulse 60   Temp 98 F (36.7 C) (Oral)   Resp 16   Ht 5\' 2"  (1.575 m)   Wt 130 lb 6.4 oz (59.1 kg)   SpO2 95%   BMI 23.85 kg/m  General: Alert,  oriented, no acute distress. HEENT:  Normocephalic, atraumatic, sclera anicteric. Chest: Clear to auscultation bilaterally.  No wheezes or rhonchi. Cardiovascular: Regular rate and rhythm, no murmurs. Abdomen: soft, nontender.  Normoactive bowel sounds.  Fullness appreciated in lower abdomen.  No hepatosplenomegaly appreciated.   Extremities: Grossly normal range of motion.  Warm, well perfused.  No edema bilaterally. Skin: No rashes or lesions noted. Lymphatics: No cervical, supraclavicular, or inguinal adenopathy. GU: Normal appearing external genitalia without erythema, excoriation, or lesions.  Speculum exam reveals moderately atrophic vaginal mucosa, no lesions or masses.  Cervix is small and atrophic.  Bimanual exam reveals small mobile uterus.  Separate from the uterus is a smooth mass within the cul-de-sac on the left.  Rectovaginal exam confirms these findings.  Laboratory & Radiologic Studies: None new  Assessment & Plan: Leah Holland is a 87 y.o. woman with a complex pelvic mass who presents today for discussion about surgery.  Patient is meeting with her cardiologist next week for preoperative clearance.  As long as she is cleared for surgery from a cardiac standpoint, she would like to proceed with surgery.  We discussed again the details for surgery.  Given her overall medical comorbidities and age, I recommend proceeding with the least surgery necessary.  My plan would be for excision of the mass and likely BSO.  Patient has previously voiced to me that she would not be interested in adjuvant therapy such as chemotherapy.  Given this, I do not think that it makes sense to keep the patient asleep for frozen section as it will not change management on the day of surgery.  If mass involves the uterus and hysterectomy is felt necessary, then I will perform total hysterectomy at the time of her surgery.   Today we discussed the plan for robotic assisted laparoscopic mass excision, bilateral salpingo-oophorectomy, possible  total hysterectomy, possible mini laparotomy, and any other indicated procedures.  Given her age and comorbidities, we will plan to observe her overnight with discharge the following day if doing well.  We discussed the risks of surgery including bleeding, need for blood transfusion, infection, damage to surrounding structures requiring repair, possible need for future surgery, medical complications related to surgery and anesthesia (VTE, stroke, myocardial infarction, and rarely death).  Perioperative teaching was performed today.  Prescription for tramadol was sent to the patient's pharmacy given worsening of abdominal pain.  42 minutes of total time was spent for this patient encounter, including preparation, face-to-face counseling with the patient and coordination of care, and documentation of the encounter.  [name], MD  Division of Gynecologic Oncology  Department of Obstetrics and Gynecology  [facility]

## 2021-10-11 NOTE — Patient Instructions (Addendum)
Preparing for your Surgery  Plan for surgery on November 02, 2021 with Dr. [name] at [facility] will be scheduled for robotic assisted bilateral salpingo-oophorectomy (removal of both ovaries and fallopian tubes), possible robotic assisted total laparoscopic hysterectomy (removal of the uterus and cervix), possible laparotomy (larger incision on your abdomen if needed).  Pre-operative Testing -You will receive a phone call from presurgical testing at [facility] to arrange for a pre-operative appointment and lab work.  -Bring your insurance card, copy of an advanced directive if applicable, medication list  -At that visit, you will be asked to sign a consent for a possible blood transfusion in case a transfusion becomes necessary during surgery.  The need for a blood transfusion is rare but having consent is a necessary part of your care.     -We will let you know what your cardiologist says about your blood thinners.  -Do not take supplements such as fish oil (omega 3), red yeast rice, turmeric before your surgery. You want to avoid medications with aspirin in them including headache powders such as BC or Goody's), Excedrin migraine.  Day Before Surgery at [place] will be asked to take in a light diet the day before surgery. You will be advised you can have clear liquids up until 3 hours before your surgery.    Eat a light diet the day before surgery.  Examples including soups, broths, toast, yogurt, mashed potatoes.  AVOID GAS PRODUCING FOODS. Things to avoid include carbonated beverages (fizzy beverages, sodas), raw fruits and raw vegetables (uncooked), or beans.   If your bowels are filled with gas, your surgeon will have difficulty visualizing your pelvic organs which increases your surgical risks.  Your role in recovery Your role is to become active as soon as directed by your doctor, while still giving yourself time to heal.  Rest when you feel tired.  You will be asked to do the following in order to speed your recovery:  - Cough and breathe deeply. This helps to clear and expand your lungs and can prevent pneumonia after surgery.  - [place]. Do mild physical activity. Walking or moving your legs help your circulation and body functions return to normal. Do not try to get up or walk alone the first time after surgery.   -If you develop swelling on one leg or the other, pain in the back of your leg, redness/warmth in one of your legs, please call the office or go to the Emergency Room to have a doppler to rule out a blood clot. For shortness of breath, chest pain-seek care in the Emergency Room as soon as possible. - Actively manage your pain. Managing your pain lets you move in comfort. We will ask you to rate your pain on a scale of zero to 10. It is your responsibility to tell your doctor or nurse where and how much you hurt so your pain can be treated.  Special Considerations -If you are diabetic, you may be placed on insulin after surgery to have closer control over your blood sugars to promote healing and recovery.  This does not mean that you will be discharged on insulin.  If applicable, your oral antidiabetics will be resumed when you are tolerating a solid diet.  -Your final pathology results from surgery should be available around one week after surgery and the results will be relayed to you when available.  -Dr. Lahoma Crocker is the surgeon that  assists your GYN Oncologist with surgery.  If you end up staying the night, the next day after your surgery you will either see Dr. Denman George, Dr. Berline Lopes, or Dr. Lahoma Crocker.  -FMLA forms can be faxed to [phone] and please allow 5-7 business days for completion.  Pain Management After Surgery -You have been prescribed your pain medication (tramadol) before surgery so that you can have these available when you are discharged from the hospital. The pain  medication is for use ONLY AFTER surgery and a new prescription will not be given.   -Make sure that you have Tylenol and Ibuprofen at home to use on a regular basis after surgery for pain control. We recommend alternating the medications every hour to six hours since they work differently and are processed in the body differently for pain relief.  -Review the attached handout on narcotic use and their risks and side effects.   Bowel Regimen -You can continue use of Sennakot at night to prevent constipation especially if you are taking the narcotic pain medication intermittently.  It is important to prevent constipation and drink adequate amounts of liquids.   Risks of Surgery Risks of surgery are low but include bleeding, infection, damage to surrounding structures, re-operation, blood clots, and very rarely death.   Blood Transfusion Information (For the consent to be signed before surgery)  We will be checking your blood type before surgery so in case of emergencies, we will know what type of blood you would need.                                            WHAT IS A BLOOD TRANSFUSION?  A transfusion is the replacement of blood or some of its parts. Blood is made up of multiple cells which provide different functions. Red blood cells carry oxygen and are used for blood loss replacement. White blood cells fight against infection. Platelets control bleeding. Plasma helps clot blood. Other blood products are available for specialized needs, such as hemophilia or other clotting disorders. BEFORE THE TRANSFUSION  Who gives blood for transfusions?  You may be able to donate blood to be used at a later date on yourself (autologous donation). Relatives can be asked to donate blood. This is generally not any safer than if you have received blood from a stranger. The same precautions are taken to ensure safety when a relative's blood is donated. Healthy volunteers who are fully evaluated to make  sure their blood is safe. This is blood bank blood. Transfusion therapy is the safest it has ever been in the practice of medicine. Before blood is taken from a donor, a complete history is taken to make sure that person has no history of diseases nor engages in risky social behavior (examples are intravenous drug use or sexual activity with multiple partners). The donor's travel history is screened to minimize risk of transmitting infections, such as malaria. The donated blood is tested for signs of infectious diseases, such as HIV and hepatitis. The blood is then tested to be sure it is compatible with you in order to minimize the chance of a transfusion reaction. If you or a relative donates blood, this is often done in anticipation of surgery and is not appropriate for emergency situations. It takes many days to process the donated blood. RISKS AND COMPLICATIONS Although transfusion therapy is very safe and saves  many lives, the main dangers of transfusion include:  Getting an infectious disease. Developing a transfusion reaction. This is an allergic reaction to something in the blood you were given. Every precaution is taken to prevent this. The decision to have a blood transfusion has been considered carefully by your caregiver before blood is given. Blood is not given unless the benefits outweigh the risks.  AFTER SURGERY INSTRUCTIONS  Return to work: 4-6 weeks if applicable  Activity: 1. Be up and out of the bed during the day.  Take a nap if needed.  You may walk up steps but be careful and use the hand rail.  Stair climbing will tire you more than you think, you may need to stop part way and rest.   2. No lifting or straining for 6 weeks over 10 pounds. No pushing, pulling, straining for 6 weeks.  3. No driving for 1 week(s) if you were cleared to drive before.  Do not drive if you are taking narcotic pain medicine and make sure that your reaction time has returned.   4. You can shower  as soon as the next day after surgery. Shower daily.  Use your regular soap and water (not directly on the incision) and pat your incision(s) dry afterwards; don't rub.  No tub baths or submerging your body in water until cleared by your surgeon. If you have the soap that was given to you by pre-surgical testing that was used before surgery, you do not need to use it afterwards because this can irritate your incisions.   5. No sexual activity and nothing in the vagina for 4 weeks. 8 weeks if you have a hysterectomy.  6. You may experience a small amount of clear drainage from your incisions, which is normal.  If the drainage persists, increases, or changes color please call the office.  7. Do not use creams, lotions, or ointments such as neosporin on your incisions after surgery until advised by your surgeon because they can cause removal of the dermabond glue on your incisions.    8. You may experience vaginal spotting after surgery or around the 6-8 week mark from surgery when the stitches at the top of the vagina begin to dissolve.  The spotting is normal but if you experience heavy bleeding, call our office.  9. Take Tylenol or ibuprofen first for pain and only use narcotic pain medication for severe pain not relieved by the Tylenol or Ibuprofen.  Monitor your Tylenol intake to a max of 4,000 mg in a 24 hour period. You can alternate these medications after surgery.  Diet: 1. Low sodium Heart Healthy Diet is recommended but you are cleared to resume your normal (before surgery) diet after your procedure.  2. It is safe to use a laxative, such as Miralax or Colace, if you have difficulty moving your bowels. You can continue use of Sennakot at bedtime every evening to keep bowel movements regular and to prevent constipation.    Wound Care: 1. Keep clean and dry.  Shower daily.  Reasons to call the Doctor: Fever - Oral temperature greater than 100.4 degrees Fahrenheit Foul-smelling vaginal  discharge Difficulty urinating Nausea and vomiting Increased pain at the site of the incision that is unrelieved with pain medicine. Difficulty breathing with or without chest pain New calf pain especially if only on one side Sudden, continuing increased vaginal bleeding with or without clots.   Contacts: For questions or concerns you should contact:  Dr. [name] at  [place], NP at [phone]  After Hours: call [phone] and have the GYN Oncologist paged/contacted (after 5 pm or on the weekends).  Messages sent via mychart are for non-urgent matters and are not responded to after hours so for urgent needs, please call the after hours number.

## 2021-10-12 NOTE — Progress Notes (Signed)
Patient here with her son for follow up with Dr. [name] and for a pre-operative appointment prior to her scheduled surgery on November 02, 2021. She is scheduled for robotic assisted bilateral salpingo-oophorectomy, possible robotic assisted total laparoscopic hysterectomy, possible laparotomy. The surgery was discussed in detail.  See after visit summary for additional details. Visual aids used to discuss items related to surgery including the incentive spirometer, sequential compression stockings, foley catheter, IV pump, multi-modal pain regimen including tylenol, photo of the surgical robot, female reproductive system to discuss surgery in detail.      Discussed post-op pain management in detail including the aspects of the enhanced recovery pathway.  Advised her that a new prescription would be sent in for tramadol.  We discussed the use of tylenol post-op and to monitor for a maximum of 4,000 mg in a 24 hour period.  Also she is advised to continue taking sennakot at home and to hold if having loose stools.  Discussed bowel regimen in detail.     Discussed the use of SCDs and measures to take at home to prevent DVT including frequent mobility.  Reportable signs and symptoms of DVT discussed. Post-operative instructions discussed and expectations for after surgery. Incisional care discussed as well including reportable signs and symptoms including erythema, drainage, wound separation.     10 minutes spent with the patient.  Verbalizing understanding of material discussed. No needs or concerns voiced at the end of the visit.   Advised patient and family to call for any needs.  Advised that her post-operative medications had been prescribed and could be picked up at any time.     She is supposed to be meeting with cardiology on 10/20/21 for pre-operative risk stratification and optimization along with recommendations for Eliquis.  This appointment is included in the global surgical bundle as  pre-operative teaching and has no charge.

## 2021-10-14 ENCOUNTER — Telehealth: Payer: Self-pay

## 2021-10-14 NOTE — Telephone Encounter (Signed)
   [facility] Pre-operative Risk Assessment    Patient Name: Leah Holland  DOB: [birth year] MRN: [id number]  HEARTCARE STAFF:  - IMPORTANT!!!!!! Under Visit Info/Reason for Call, type in Other and utilize the format Clearance MM/DD/YY or Clearance TBD. Do not use dashes or single digits. - Please review there is not already an duplicate clearance open for this procedure. - If request is for dental extraction, please clarify the # of teeth to be extracted. - If the patient is currently at the dentist's office, call Pre-Op Callback Staff (MA/nurse) to input urgent request.  - If the patient is not currently in the dentist office, please route to the Pre-Op pool.  Request for surgical clearance:  What type of surgery is being performed? Robotic Assist Laparoscopic Bilateral Salpingo-Oophorectomy, possible Hysterectomy   When is this surgery scheduled? TBD  What type of clearance is required (medical clearance vs. Pharmacy clearance to hold med vs. Both)? Medical and pharmacy   Are there any medications that need to be held prior to surgery and how long? Eliquis   Practice name and name of physician performing surgery? [facility] w/Dr. [name] MD   What is the office phone number? [phone]   7.   What is the office fax number? [phone]  8.   Anesthesia type (None, local, MAC, general) ? General    Zebedee Iba 10/14/2021, 3:58 PM  _________________________________________________________________   (provider comments below)

## 2021-10-14 NOTE — Telephone Encounter (Signed)
Leah Holland and Dr. Barrie Dunker, you saw this patient 08/20/21. We are now asked for cardiac clearance for hysterectomy. She is unable to complete 4.0 METS. Do you recommend any additional testing prior to surgery?   Will send to pharmD for eliquis.

## 2021-10-15 NOTE — Telephone Encounter (Signed)
Patient with diagnosis of afib on Eliquis for anticoagulation.    Procedure:  Robotic Assist Laparoscopic Bilateral Salpingo-Oophorectomy, possible Hysterectomy  Date of procedure: TBD   CHA2DS2-VASc Score = 6   This indicates a 9.7% annual risk of stroke. The patient's score is based upon: CHF History: 0 HTN History: 1 Diabetes History: 1 Stroke History: 0 Vascular Disease History: 1 Age Score: 2 Gender Score: 1      CrCl 30 ml/min  Per office protocol, patient can hold Eliquis for 3 days prior to procedure.

## 2021-10-15 NOTE — Telephone Encounter (Signed)
   Name: Leah Holland  DOB: 1922-02-16  MRN: [id number]  Primary Cardiologist: Quay Burow, MD  Chart reviewed as part of pre-operative protocol coverage. This appears to be a duplicate request. She is scheduled for a visit with Coletta Memos, NP for preop assessment 10/20/21. Given her age, comorbidities, and general lack of activity, feel her preop risk will be better assessed at her upcoming visit as scheduled.   Pre-op covering staff: - Please contact requesting surgeon's office via preferred method (i.e, phone, fax) to inform them of need for appointment prior to surgery.    Abigail Butts, PA-C  10/15/2021, 12:42 PM

## 2021-10-18 NOTE — Progress Notes (Signed)
Cardiology Clinic Note   Patient Name: Leah Holland Date of Encounter: 10/19/2021  Primary Care Provider:  [name], MD Primary Cardiologist:  Quay Burow, MD  Patient Profile    Leah Holland 87 year old female presents the clinic today for follow-up evaluation of her paroxysmal atrial fibrillation, and hypertension.  Past Medical History    Past Medical History:  Diagnosis Date   Anxiety attack    "take RX prn" (08/13/2013)   Arthritis    "probably" (08/13/2013)   Atrial fibrillation ([place])    new onset/pt report 08/13/2013   Bladder wall thickening 07/01/2021   CT scan in Epic   Bronchitis 12/2011   Cholelithiasis 07/01/2021   CT in Epic.   Common bile duct dilation 07/01/2021   CT in Epic:  13 mm.   Complication of anesthesia    Compression fracture of first lumbar vertebra ([place]) 07/01/2021   CT in Epic.   Depression    Femur fracture ([facility])    left proximal femur non-union   GERD (gastroesophageal reflux disease)    H/O hiatal hernia    Hardware complicating wound infection ([place]) 08/22/2016   History of blood transfusion    "w/both knee OR's" (08/13/2013)   Hyperlipidemia    Hyperlipidemia    Hypertension    Hypertensive urgency 10/22/2018   Hypothyroidism    takes synthroid   Insomnia    Intertrochanteric fracture of right hip ([place]) 07/30/2015   Neuromuscular disorder ([facility])    carpal tunnel in right hand   Periprosthetic fracture around internal prosthetic right hip joint ([place]), nonunion basicervical femoral neck with broken hardware and subtrochanteric fracture 03/16/2016   PONV (postoperative nausea and vomiting)    Prosthetic hip infection ([place]) 08/22/2016   Proteus infection 08/22/2016   Serratia infection 11/14/2016   Skin cancer of face 2000's   "one on each side of my face and one on my nose" (08/13/2013)   Swelling of extremity    sees Dr. Gwenlyn Found, cardiologist 1 x year [phone]   Tendon dysfunction 08/22/2016   Wears hearing aid     [facility]   Past Surgical History:  Procedure Laterality Date   2D ECHOCARDIOGRAM  12/07/2004   EF 48%, moderate pulmonary hypertension   BREAST BIOPSY Left ?1970's   CARDIOVASCULAR STRESS TEST  12/07/11   CARDIOVASCULAR STRESS TEST  12/07/2011   LEXISCAN, normal scan, no significant wall abnormalities noted   CARPAL TUNNEL RELEASE Right ~ 2005   CATARACT EXTRACTION, BILATERAL Bilateral 2000   DILATION AND CURETTAGE OF UTERUS  1950's   DOPPLER ECHOCARDIOGRAPHY  12/07/11   EYE SURGERY     FEMUR IM NAIL Right 08/01/2015   Procedure: INTRAMEDULLARY (IM) NAIL FEMORAL;  Surgeon: Marchia Bond, MD;  Location: [facility];  Service: Orthopedics;  Laterality: Right;   HARDWARE REMOVAL Right 03/16/2016   Procedure: HARDWARE REMOVAL;  Surgeon: Marchia Bond, MD;  Location: [facility];  Service: Orthopedics;  Laterality: Right;   INTRAMEDULLARY (IM) NAIL INTERTROCHANTERIC Left 01/26/2017   Procedure: INTRAMEDULLARY (IM) NAIL INTERTROCHANTRIC;  Surgeon: Leandrew Koyanagi, MD;  Location: [facility];  Service: Orthopedics;  Laterality: Left;   JOINT REPLACEMENT     bilateral knee replacement   SKIN CANCER EXCISION  2000's   "off my nose" (08/13/2013)   TOTAL HIP ARTHROPLASTY Right 03/16/2016   Procedure: TOTAL HIP ARTHROPLASTY;  Surgeon: Marchia Bond, MD;  Location: [facility];  Service: Orthopedics;  Laterality: Right;   TOTAL HIP ARTHROPLASTY Left 06/09/2017   Procedure: REMOVE LEFT HIP  INTRAMEDULLARY NAIL AND CONVERT TO LEFT CEMENTED HEMI HIP ARTHROPLASTY;  Surgeon: Leandrew Koyanagi, MD;  Location: [facility];  Service: Orthopedics;  Laterality: Left;   TOTAL KNEE ARTHROPLASTY Right 2005   TOTAL KNEE ARTHROPLASTY  03/02/2012   Procedure: TOTAL KNEE ARTHROPLASTY;  Surgeon: Yvette Rack., MD;  Location: [facility];  Service: Orthopedics;  Laterality: Left;    Allergies  Allergies  Allergen Reactions   Levaquin [Levofloxacin] Other (See Comments)    tendinopathy   Morphine And Related Other (See Comments)    Made pt "wild"    History  of Present Illness    [name] has a PMH of hypertension, PAF on Eliquis, GERD, hypothyroidism, diabetes mellitus type 2, hyperlipidemia, bipolar disorder, right carotid bruit, and hypokalemia.  She was noted to have new onset atrial fibrillation with RVR in 2014.  She was evaluated in the emergency room 08/03/2013 for chest pain.  She was diagnosed with GERD and a UTI.  At that time she had an EKG which shows normal sinus rhythm rate of 79 bpm with PACs.  She had previously reported dizziness, feeling tired and heart racing.  During her A. fib event she was admitted started on IV diltiazem and Eliquis 2.5 mg twice daily.  She converted to normal sinus rhythm and was transitioned to p.o. Cardizem 120 mg daily.  Her furosemide was decreased to 20 mg daily and 10 mill equivalents of potassium.  She also has history of recurrent right and left hip fractures requiring surgical intervention.  With each fracture she has recuperated well and continue to walk with use of a walker.  [name] felt that she may have an element of orthostatic hypotension which led to her falls in the past.  It was recommended that she change positions slowly.  She was last seen by Dr. Gwenlyn Found on 02/12/2020.  During that time she remained stable.  She denied chest pain or shortness of breath.   She contacted nurse triage line on 12/23/2020.  She reported that since she had broken her leg she noticed increased shortness of breath.  Her therapist was concerned with an elevation 10 bpm with the patient moving from sitting to standing.  She denies changes to blood pressure or dizziness.  She presented to the clinic 12/25/2020 for follow-up evaluation and stated with increased physical activity she has noticed faster heart rates.  EKG today showed atrial fibrillation 55 bpm.  She had also noticed increased shortness of breath over the last 2 months.  This was both with increased physical activity and with normal  physical activities.  She was a resident at friend's home and was accompanied  by [name].  She denied chest pain, lower extremity edema, and dizziness with these episodes.  I  ordered an echocardiogram, gave her the salty 6 diet sheet, asked her maintain her physical activity and planned her follow-up with Dr. Gwenlyn Found in 3 months.  She followed up with Dr. Gwenlyn Found [id number].  She remained stable at that time.  She did notice some increased dyspnea on exertion.  She had fractured her hip earlier in the year.  Her echocardiogram was reviewed and showed normal LV function, increased RV dysfunction with RV systolic pressures and moderate-severe TR.  She presents the clinic today for follow-up evaluation and preoperative cardiac evaluation.  She states she continues to feel well.  She does note occasional episodes of pain in her abdomen and pelvis  with certain positions.  She has been taking acetaminophen daily.  She is still able to walk to her dining hall and back with the use of a walker.  She has not had any episodes of chest discomfort or changes with her breathing.  Her blood pressure continues to be well controlled.  She presents to the clinic today with both of her sons to talk about preoperative cardiac evaluation.  It appears that her pelvic mass may be applying pressure to her bladder which makes it hard for her to fully empty.  I will have her continue her physical activity, continue her current diet, and plan follow-up for 6 months.  Today she denies denies chest pain, shortness of breath, lower extremity edema, fatigue, palpitations, melena, hematuria, hemoptysis, diaphoresis, weakness, presyncope, syncope, orthopnea, and PND.   Home Medications    Prior to Admission medications   Medication Sig Start Date End Date Taking? Authorizing Provider  acetaminophen (TYLENOL) 325 MG tablet Take 650 mg by mouth every 6 (six) hours as needed for moderate pain.     [provider]  chlorthalidone (HYGROTON) 25 MG tablet TAKE 1 TABLET ONCE DAILY. Patient taking differently: Take 25 mg by mouth daily.  08/06/20   Lorretta Harp, MD  Cholecalciferol 2000 units CAPS Take 2,000 Units by mouth every evening.    [provider]  denosumab (PROLIA) 60 MG/ML SOLN injection Inject 60 mg into the skin every 6 (six) months. Administer in upper arm, thigh, or abdomen    [provider]  diltiazem (CARDIZEM CD) 180 MG 24 hr capsule TAKE 1 CAPSULE DAILY. Patient taking differently: Take 180 mg by mouth daily.  06/03/20   Lorretta Harp, MD  DULoxetine (CYMBALTA) 60 MG capsule Take 60 mg by mouth daily.     [provider]  ELIQUIS 2.5 MG TABS tablet TAKE 1 TABLET BY MOUTH TWICE DAILY. Patient taking differently: Take 2.5 mg by mouth 2 (two) times daily.  07/23/20   Lorretta Harp, MD  esomeprazole (NEXIUM) 40 MG capsule Take 40 mg by mouth daily at 12 noon.    [provider]  Flaxseed, Linseed, (FLAXSEED OIL) 1000 MG CAPS Take 1,000 mg by mouth 2 (two) times daily.     [provider]  furosemide (LASIX) 20 MG tablet Take 20 mg by mouth daily as needed.     [provider]  levothyroxine (SYNTHROID) 112 MCG tablet Take 112 mcg by mouth daily before breakfast.     [provider]  magnesium hydroxide (MILK OF MAGNESIA) 400 MG/5ML suspension Take 30 mLs by mouth every other day.    [provider]  metoprolol (LOPRESSOR) 50 MG tablet Take 50 mg by mouth 2 (two) times daily.    [provider]  Multiple Vitamins-Minerals (PRESERVISION AREDS 2+MULTI VIT PO) Take by mouth 2 (two) times daily. 892-11-94-1 mg capsule    [provider]  potassium chloride SA (KLOR-CON) 20 MEQ tablet Take 20 mEq by mouth daily as needed.    [provider]  saccharomyces boulardii (FLORASTOR) 250 MG capsule Take 250 mg by mouth daily.    [provider]  senna (SENOKOT) 8.6 MG tablet  Take 2 tablets by mouth at bedtime.    [provider]  sulfamethoxazole-trimethoprim (BACTRIM) 400-80 MG tablet TAKE 1 TABLET BY MOUTH TWICE DAILY. 12/17/20   Truman Hayward, MD  temazepam (RESTORIL) 30 MG capsule Take 1 capsule (30 mg total) by mouth at bedtime as  needed for sleep. 10/20/20   Virgie Dad, MD    Family History    Family History  Problem Relation Age of Onset   Arrhythmia Mother    Heart disease Mother    Stroke Father 27   Arrhythmia Sister    Heart disease Sister    Heart disease Sister    Stroke Sister    Diabetes Sister    Heart disease Brother    Heart disease Brother    Cancer Brother        Colon cancer   Arrhythmia Brother    Heart disease Brother    Heart disease Child 66   Heart disease Child 64   Breast cancer Neg Hx    Ovarian cancer Neg Hx    Endometrial cancer Neg Hx    Pancreatic cancer Neg Hx    Prostate cancer Neg Hx    She indicated that her mother is deceased. She indicated that her father is deceased. She indicated that one of her four sisters is deceased. She indicated that three of her four brothers are deceased. She indicated that both of her sons are alive. She indicated that one of her three children is deceased. She indicated that the status of her neg hx is unknown.  Social History    Social History   Socioeconomic History   Marital status: Widowed    Spouse name: Not on file   Number of children: Not on file   Years of education: Not on file   Highest education level: Not on file  Occupational History   Not on file  Tobacco Use   Smoking status: Never   Smokeless tobacco: Never  Substance and Sexual Activity   Alcohol use: No   Drug use: No   Sexual activity: Not Currently  Other Topics Concern   Not on file  Social History Narrative   Widowed. Lives at [facility]. Son died in the [place] War and she has not slept well since then.   Admitted to [facility] 01/30/17   Never smoked    Alcohol none   Full Code   Social Determinants of Health   Financial Resource Strain: Not on file  Food Insecurity: Not on file  Transportation Needs: Not on file  Physical Activity: Not on file  Stress: Not on file  Social Connections: Not on file  Intimate Partner Violence: Not on file     Review of Systems    General:  No chills, fever, night sweats or weight changes.  Cardiovascular:  No chest pain, dyspnea on exertion, edema, orthopnea, palpitations, paroxysmal nocturnal dyspnea. Dermatological: No rash, lesions/masses Respiratory: No cough, dyspnea Urologic: No hematuria, dysuria Abdominal:   No nausea, vomiting, diarrhea, bright red blood per rectum, melena, or hematemesis Neurologic:  No visual changes, wkns, changes in mental status. All other systems reviewed and are otherwise negative except as noted above.  Physical Exam    VS:  There were no vitals taken for this visit. , BMI There is no height or weight on file to calculate BMI. GEN: Well nourished, well developed, in no acute distress. HEENT: normal. Neck: Supple, no JVD, carotid bruits, or masses. Cardiac: RRR, no murmurs, rubs, or gallops. No clubbing, cyanosis, edema.  Radials/DP/PT 2+ and equal bilaterally.  Respiratory:  Respirations regular and unlabored, clear to auscultation bilaterally. GI: Soft, nontender, nondistended, BS + x 4. MS: no deformity or atrophy. Skin: warm and dry, no rash. Neuro:  Strength and sensation are  intact. Psych: Normal affect.  Accessory Clinical Findings    Recent Labs: 01/19/2021: ALT 17; Hemoglobin 14.3; Platelets 281 06/25/2021: BUN 13; Creat 0.99; Potassium 4.2; Sodium 135   Recent Lipid Panel    Component Value Date/Time   CHOL 116 04/26/2013 2059   TRIG 56 04/26/2013 2059   HDL 45 04/26/2013 2059   CHOLHDL 2.6 04/26/2013 2059   VLDL 11 04/26/2013 2059   [place] 60 04/26/2013 2059    ECG personally reviewed by me today-atrial fibrillation with slow ventricular  response inferior infarct undetermined age, anterior septal infarct undetermined age 65 bpm- No acute changes  EKG 10/02/2020 Atrial fibrillation septal infarct undetermined age 65 bpm  Echocardiogram 01/18/2021  IMPRESSIONS     1. Left ventricular ejection fraction, by estimation, is 65 to 70%. Left  ventricular ejection fraction by 3D volume is 68 %. The left ventricle has  normal function. The left ventricle has no regional wall motion  abnormalities. Left ventricular diastolic   function could not be evaluated. There is the interventricular septum is  flattened in systole and diastole, consistent with right ventricular  pressure and volume overload.   2. Right ventricular systolic function is mildly reduced. The right  ventricular size is moderately enlarged. There is moderately elevated  pulmonary artery systolic pressure. The estimated right ventricular  systolic pressure is 13.2 mmHg.   3. Left atrial size was severely dilated.   4. Right atrial size was severely dilated.   5. The mitral valve is degenerative. Mild to moderate mitral valve  regurgitation. No evidence of mitral stenosis.   6. The tricuspid valve is abnormal. Tricuspid valve regurgitation is  moderate to severe.   7. The aortic valve is tricuspid. There is mild calcification of the  aortic valve. There is mild thickening of the aortic valve. Aortic valve  regurgitation is not visualized. Mild aortic valve sclerosis is present,  with no evidence of aortic valve  stenosis.   8. The inferior vena cava is normal in size with <50% respiratory  variability, suggesting right atrial pressure of 8 mmHg.  Assessment & Plan   1. Paroxysmal atrial fibrillation-heart rate today 82 bpm.  Previous EKG showed atrial fibrillation with slow ventricular response posterior infarct undetermined age anterior septal infarct undetermined age 8 bpm.   Continue apixaban metoprolol, diltiazem Avoid triggers caffeine, chocolate, EtOH,  dehydration etc. Heart healthy low-sodium diet-salty 6 given-reviewed Maintain physical activity Continue to change positions slowly  Essential hypertension-BP today 130/65.  Well-controlled at home Continue metoprolol, diltiazem Heart healthy low-sodium diet-salty 6 given Increase physical activity as tolerated   Hyperlipidemia- LDL 129 on 7/20  Heart healthy low-sodium high-fiber diet Increase physical activity as tolerated  DOE-chronic stable.  Echocardiogram showed EF 65-70% LVEF 06/12/21  Continue heart healthy low-sodium diet Heart healthy low-sodium diet Maintain physical activity  Preoperative cardiac evaluation-robotic assisted bilateral salpingo-oophorectomy, 11/02/2021 Dr. [name]   Primary Cardiologist: Quay Burow, MD  Chart reviewed as part of pre-operative protocol coverage. Given past medical history and time since last visit, based on ACC/AHA guidelines, MIKHIA DUSEK would be at acceptable risk for the planned procedure without further cardiovascular testing.   Her RCRI is a class II risk, 0.9% risk of major cardiac event.  She is able to complete greater than 4 METS of physical activity.  Patient with diagnosis of afib on Eliquis for anticoagulation.     Procedure:  Robotic Assist Laparoscopic Bilateral Salpingo-Oophorectomy, possible Hysterectomy  Date of procedure: TBD     CHA2DS2-VASc  Score = 6   This indicates a 9.7% annual risk of stroke. The patient's score is based upon: CHF History: 0 HTN History: 1 Diabetes History: 1 Stroke History: 0 Vascular Disease History: 1 Age Score: 2 Gender Score: 1       CrCl 30 ml/min   Per office protocol, patient can hold Eliquis for 3 days prior to procedure.      Patient was advised that if she develops new symptoms prior to surgery to contact our office to arrange a follow-up appointment.  He verbalized understanding.  I will route this recommendation to the requesting party via Epic fax  function and remove from pre-op pool.  Please call with questions.      Disposition: Follow-up with Dr. Gwenlyn Found in 6-9 months   Jossie Ng. Addalyne Vandehei NP-C    10/19/2021, 7:05 AM [address] Office [phone] Fax [phone]  Notice: This dictation was prepared with Dragon dictation along with smaller phrase technology. Any transcriptional errors that result from this process are unintentional and may not be corrected upon review.  I spent 15 minutes examining this patient, reviewing medications, and using patient centered shared decision making involving her cardiac care.  Prior to her visit I spent greater than 20 minutes reviewing her past medical history,  medications, and prior cardiac tests.

## 2021-10-20 ENCOUNTER — Other Ambulatory Visit: Payer: Self-pay

## 2021-10-20 ENCOUNTER — Encounter: Payer: Self-pay | Admitting: General Practice

## 2021-10-20 ENCOUNTER — Ambulatory Visit (INDEPENDENT_AMBULATORY_CARE_PROVIDER_SITE_OTHER): Payer: Medicare Other | Admitting: General Practice

## 2021-10-20 VITALS — BP 130/68 | HR 82 | Ht 62.0 in | Wt 130.0 lb

## 2021-10-20 DIAGNOSIS — I480 Paroxysmal atrial fibrillation: Secondary | ICD-10-CM | POA: Diagnosis not present

## 2021-10-20 DIAGNOSIS — Z01810 Encounter for preprocedural cardiovascular examination: Secondary | ICD-10-CM | POA: Diagnosis not present

## 2021-10-20 DIAGNOSIS — R0602 Shortness of breath: Secondary | ICD-10-CM | POA: Diagnosis not present

## 2021-10-20 DIAGNOSIS — I10 Essential (primary) hypertension: Secondary | ICD-10-CM

## 2021-10-20 NOTE — Patient Instructions (Signed)
Medication Instructions:  The current medical regimen is effective;  continue present plan and medications as directed. Please refer to the Current Medication list given to you today.   *If you need a refill on your cardiac medications before your next appointment, please call your pharmacy*  Lab Work:   Testing/Procedures:  NONE    NONE  Special Instructions CLEARED FOR YOUR SURGERY.   Follow-Up: Your next appointment:  6-9 month(s) In Person with Quay Burow, MD  or Coletta Memos, FNP      Please call our office 2 months in advance to schedule this appointment   At [facility], you and your health needs are our priority.  As part of our continuing mission to provide you with exceptional heart care, we have created designated Provider Care Teams.  These Care Teams include your primary Cardiologist (physician) and Advanced Practice Providers (APPs -  Physician Assistants and Nurse Practitioners) who all work together to provide you with the care you need, when you need it.

## 2021-10-21 ENCOUNTER — Telehealth: Payer: Self-pay

## 2021-10-21 ENCOUNTER — Telehealth: Payer: Self-pay | Admitting: *Deleted

## 2021-10-21 NOTE — Telephone Encounter (Signed)
Received cardiology clearance and fax report to pre surgical center

## 2021-10-21 NOTE — Telephone Encounter (Signed)
Spoke with patients son Patrick Jupiter and reviewed cardiology recommendations for Eliquis prior to surgery on 11/02/21. Cardiology is recommending the patient hold Eliquis 3 days prior to surgery. Last dose will be on 10/29/21. Son verbalizes understanding recommendations were given to him at cardiology appointment yesterday but he appreciates the call. Instructed to call with any questions or concerns.

## 2021-10-22 ENCOUNTER — Telehealth: Payer: Self-pay | Admitting: *Deleted

## 2021-10-22 NOTE — Telephone Encounter (Signed)
Leah Holland from Dr Jodi Mourning office called and stated "We received a surgical clearance form for her. Dr Jacelyn Grip stated that the patient just saw her cardiologist and the clearance needs to come from Dr Gwenlyn Found."

## 2021-10-25 NOTE — Progress Notes (Addendum)
COVID swab appointment: 10/29/21  COVID Vaccine Completed: yes x5 Date COVID Vaccine completed: 12/16/19, 01/13/20 Has received booster: 10/20/20, 05/11/21 COVID vaccine manufacturer: [place]      Date of COVID positive in last 90 days: no  PCP - Yaakov Guthrie, MD Cardiologist - Quay Burow, MD  Cardiac clearance by Coletta Memos 10/20/21 in Epic  Chest x-ray - n/a EKG - 08/20/21 Epic Stress Test - 03/11/16 Epic ECHO - 01/18/21 Epic Cardiac Cath - n/a Pacemaker/ICD device last checked: n/a Spinal Cord Stimulator:n/a  Sleep Study - n/a CPAP -   Fasting Blood Sugar - patient and family deny Checks Blood Sugar __ times a day  Blood Thinner Instructions: Eliquis, hold 3 days Aspirin Instructions: Last Dose: 10/29/21  Activity level: Can go up a flight of stairs and perform activities of daily living without stopping and without symptoms of chest pain or shortness of breath. One or two stairs, SOB with exertion       Anesthesia review: PONV, DM 2, HTN, a fib  Patient denies shortness of breath, fever, cough and chest pain at PAT appointment   Patient verbalized understanding of instructions that were given to them at the PAT appointment. Patient was also instructed that they will need to review over the PAT instructions again at home before surgery.

## 2021-10-27 ENCOUNTER — Encounter (HOSPITAL_COMMUNITY): Payer: Self-pay

## 2021-10-27 ENCOUNTER — Other Ambulatory Visit: Payer: Self-pay

## 2021-10-27 ENCOUNTER — Encounter (HOSPITAL_COMMUNITY)
Admission: RE | Admit: 2021-10-27 | Discharge: 2021-10-27 | Disposition: A | Discharge status: Home / Self Care | Payer: Medicare Other | Source: Ambulatory Visit | Attending: Gynecologic Oncology | Admitting: Gynecologic Oncology

## 2021-10-27 VITALS — BP 145/69 | HR 73 | Temp 98.2°F | Resp 16 | Ht 62.0 in | Wt 129.0 lb

## 2021-10-27 DIAGNOSIS — R1900 Intra-abdominal and pelvic swelling, mass and lump, unspecified site: Secondary | ICD-10-CM | POA: Insufficient documentation

## 2021-10-27 DIAGNOSIS — K219 Gastro-esophageal reflux disease without esophagitis: Secondary | ICD-10-CM | POA: Diagnosis not present

## 2021-10-27 DIAGNOSIS — Z7901 Long term (current) use of anticoagulants: Secondary | ICD-10-CM | POA: Diagnosis not present

## 2021-10-27 DIAGNOSIS — I10 Essential (primary) hypertension: Secondary | ICD-10-CM | POA: Insufficient documentation

## 2021-10-27 DIAGNOSIS — E119 Type 2 diabetes mellitus without complications: Secondary | ICD-10-CM | POA: Diagnosis not present

## 2021-10-27 DIAGNOSIS — Z01812 Encounter for preprocedural laboratory examination: Secondary | ICD-10-CM | POA: Insufficient documentation

## 2021-10-27 DIAGNOSIS — I4891 Unspecified atrial fibrillation: Secondary | ICD-10-CM | POA: Insufficient documentation

## 2021-10-27 HISTORY — DX: Zoster without complications: B02.9

## 2021-10-27 LAB — COMPREHENSIVE METABOLIC PANEL
ALT: 14 U/L (ref 0–44)
AST: 16 U/L (ref 15–41)
Albumin: 4.1 g/dL (ref 3.5–5.0)
Alkaline Phosphatase: 113 U/L (ref 38–126)
Anion gap: 9 (ref 5–15)
BUN: 26 mg/dL — ABNORMAL HIGH (ref 8–23)
CO2: 32 mmol/L (ref 22–32)
Calcium: 10.7 mg/dL — ABNORMAL HIGH (ref 8.9–10.3)
Chloride: 96 mmol/L — ABNORMAL LOW (ref 98–111)
Creatinine, Ser: 1.01 mg/dL — ABNORMAL HIGH (ref 0.44–1.00)
GFR, Estimated: 51 mL/min — ABNORMAL LOW (ref >60)
Glucose, Bld: 105 mg/dL — ABNORMAL HIGH (ref 70–99)
Potassium: 4.4 mmol/L (ref 3.5–5.1)
Sodium: 137 mmol/L (ref 135–145)
Total Bilirubin: 0.6 mg/dL (ref 0.3–1.2)
Total Protein: 7.8 g/dL (ref 6.5–8.1)

## 2021-10-27 LAB — URINALYSIS, ROUTINE W REFLEX MICROSCOPIC
Bilirubin Urine: NEGATIVE
Glucose, UA: NEGATIVE mg/dL
Hgb urine dipstick: NEGATIVE
Ketones, ur: NEGATIVE mg/dL
Leukocytes,Ua: NEGATIVE
Nitrite: NEGATIVE
Protein, ur: NEGATIVE mg/dL
Specific Gravity, Urine: 1.018 (ref 1.005–1.030)
pH: 7 (ref 5.0–8.0)

## 2021-10-27 LAB — CBC
HCT: 43 % (ref 36.0–46.0)
Hemoglobin: 13.9 g/dL (ref 12.0–15.0)
MCH: 29.8 pg (ref 26.0–34.0)
MCHC: 32.3 g/dL (ref 30.0–36.0)
MCV: 92.3 fL (ref 80.0–100.0)
Platelets: 344 10*3/uL (ref 150–400)
RBC: 4.66 MIL/uL (ref 3.87–5.11)
RDW: 13.5 % (ref 11.5–15.5)
WBC: 8.9 10*3/uL (ref 4.0–10.5)
nRBC: 0 % (ref 0.0–0.2)

## 2021-10-27 LAB — GLUCOSE, CAPILLARY: Glucose-Capillary: 105 mg/dL — ABNORMAL HIGH (ref 70–99)

## 2021-10-27 LAB — HEMOGLOBIN A1C
Hgb A1c MFr Bld: 5.9 % — ABNORMAL HIGH (ref 4.8–5.6)
Mean Plasma Glucose: 122.63 mg/dL

## 2021-10-27 NOTE — Patient Instructions (Addendum)
DUE TO COVID-19 ONLY ONE VISITOR IS ALLOWED TO COME WITH YOU AND STAY IN THE WAITING ROOM ONLY DURING PRE OP AND PROCEDURE.   **NO VISITORS ARE ALLOWED IN THE SHORT STAY AREA OR RECOVERY ROOM!!**  IF YOU WILL BE ADMITTED INTO THE HOSPITAL YOU ARE ALLOWED ONLY TWO SUPPORT PEOPLE DURING VISITATION HOURS ONLY (7AM -8PM)   The support person(s) may change daily. The support person(s) must pass our screening, gel in and out, and wear a mask at all times, including in the patient's room. Patients must also wear a mask when staff or their support person are in the room.  No visitors under the age of 67. Any visitor under the age of 66 must be accompanied by an adult.    COVID SWAB TESTING MUST BE COMPLETED ON:  10/29/21 **MUST PRESENT COMPLETED FORM AT TESTING SITE**    [facility] [address] (backside of the building) Open 8am-3pm. No appointment needed. You are not required to quarantine, however you are required to wear a well-fitted mask when you are out and around people not in your household.  Hand Hygiene often Do NOT share personal items Notify your provider if you are in close contact with someone who has COVID or you develop fever 100.4 or greater, new onset of sneezing, cough, sore throat, shortness of breath or body aches.       Your procedure is scheduled on: 11/02/21   Report to [facility] Main Entrance    Report to admitting at 9:00 AM   Call this number if you have problems the morning of surgery [phone]   Do not eat food :After Midnight.   May have liquids until 8:10 AM day of surgery  CLEAR LIQUID DIET  Foods Allowed                                                                     Foods Excluded  Water, Black Coffee and tea (no milk or creamer)           liquids that you cannot  Plain Jell-O in any flavor  (No red)                                    see through such as: Fruit ices (not with fruit pulp)                                             milk, soups, orange juice              Iced Popsicles (No red)                                                All solid food  Apple juices Sports drinks like Gatorade (No red) Lightly seasoned clear broth or consume(fat free) Sugar   Oral Hygiene is also important to reduce your risk of infection.                                    Remember - BRUSH YOUR TEETH THE MORNING OF SURGERY WITH YOUR REGULAR TOOTHPASTE   Take these medicines the morning of surgery with A SIP OF WATER: Tylenol, Synthroid, Diltiazem, Metoprolol, Tramadol.                              You may not have any metal on your body including hair pins, jewelry, and body piercing             Do not wear make-up, lotions, powders, perfumes, or deodorant  Do not wear nail polish including gel and S&S, artificial/acrylic nails, or any other type of covering on natural nails including finger and toenails. If you have artificial nails, gel coating, etc. that needs to be removed by a nail salon please have this removed prior to surgery or surgery may need to be canceled/ delayed if the surgeon/ anesthesia feels like they are unable to be safely monitored.   Do not shave  48 hours prior to surgery.    Do not bring valuables to the hospital. [place].   Contacts, dentures or bridgework may not be worn into surgery.    Patients discharged on the day of surgery will not be allowed to drive home.              Please read over the following fact sheets you were given: IF YOU HAVE QUESTIONS ABOUT YOUR PRE-OP INSTRUCTIONS PLEASE CALL [place] - Preparing for Surgery Before surgery, you can play an important role.  Because skin is not sterile, your skin needs to be as free of germs as possible.  You can reduce the number of germs on your skin by washing with CHG (chlorahexidine gluconate) soap before surgery.  CHG is an  antiseptic cleaner which kills germs and bonds with the skin to continue killing germs even after washing. Please DO NOT use if you have an allergy to CHG or antibacterial soaps.  If your skin becomes reddened/irritated stop using the CHG and inform your nurse when you arrive at Short Stay. Do not shave (including legs and underarms) for at least 48 hours prior to the first CHG shower.  You may shave your face/neck.  Please follow these instructions carefully:  1.  Shower with CHG Soap the night before surgery and the  morning of surgery.  2.  If you choose to wash your hair, wash your hair first as usual with your normal  shampoo.  3.  After you shampoo, rinse your hair and body thoroughly to remove the shampoo.                             4.  Use CHG as you would any other liquid soap.  You can apply chg directly to the skin and wash.  Gently with a scrungie or clean washcloth.  5.  Apply the CHG Soap to your body ONLY  FROM THE NECK DOWN.   Do   not use on face/ open                           Wound or open sores. Avoid contact with eyes, ears mouth and   genitals (private parts).                       Wash face,  Genitals (private parts) with your normal soap.             6.  Wash thoroughly, paying special attention to the area where your    surgery  will be performed.  7.  Thoroughly rinse your body with warm water from the neck down.  8.  DO NOT shower/wash with your normal soap after using and rinsing off the CHG Soap.                9.  Pat yourself dry with a clean towel.            10.  Wear clean pajamas.            11.  Place clean sheets on your bed the night of your first shower and do not  sleep with pets. Day of Surgery : Do not apply any lotions/deodorants the morning of surgery.  Please wear clean clothes to the hospital/surgery center.  FAILURE TO FOLLOW THESE INSTRUCTIONS MAY RESULT IN THE CANCELLATION OF YOUR SURGERY  PATIENT  SIGNATURE_________________________________  NURSE SIGNATURE__________________________________  ________________________________________________________________________  WHAT IS A BLOOD TRANSFUSION? Blood Transfusion Information  A transfusion is the replacement of blood or some of its parts. Blood is made up of multiple cells which provide different functions. Red blood cells carry oxygen and are used for blood loss replacement. White blood cells fight against infection. Platelets control bleeding. Plasma helps clot blood. Other blood products are available for specialized needs, such as hemophilia or other clotting disorders. BEFORE THE TRANSFUSION  Who gives blood for transfusions?  Healthy volunteers who are fully evaluated to make sure their blood is safe. This is blood bank blood. Transfusion therapy is the safest it has ever been in the practice of medicine. Before blood is taken from a donor, a complete history is taken to make sure that person has no history of diseases nor engages in risky social behavior (examples are intravenous drug use or sexual activity with multiple partners). The donor's travel history is screened to minimize risk of transmitting infections, such as malaria. The donated blood is tested for signs of infectious diseases, such as HIV and hepatitis. The blood is then tested to be sure it is compatible with you in order to minimize the chance of a transfusion reaction. If you or a relative donates blood, this is often done in anticipation of surgery and is not appropriate for emergency situations. It takes many days to process the donated blood. RISKS AND COMPLICATIONS Although transfusion therapy is very safe and saves many lives, the main dangers of transfusion include:  Getting an infectious disease. Developing a transfusion reaction. This is an allergic reaction to something in the blood you were given. Every precaution is taken to prevent this. The decision to  have a blood transfusion has been considered carefully by your caregiver before blood is given. Blood is not given unless the benefits outweigh the risks. AFTER THE TRANSFUSION Right after receiving a blood transfusion, you will usually feel much better and  more energetic. This is especially true if your red blood cells have gotten low (anemic). The transfusion raises the level of the red blood cells which carry oxygen, and this usually causes an energy increase. The nurse administering the transfusion will monitor you carefully for complications. HOME CARE INSTRUCTIONS  No special instructions are needed after a transfusion. You may find your energy is better. Speak with your caregiver about any limitations on activity for underlying diseases you may have. SEEK MEDICAL CARE IF:  Your condition is not improving after your transfusion. You develop redness or irritation at the intravenous (IV) site. SEEK IMMEDIATE MEDICAL CARE IF:  Any of the following symptoms occur over the next 12 hours: Shaking chills. You have a temperature by mouth above 102 F (38.9 C), not controlled by medicine. Chest, back, or muscle pain. People around you feel you are not acting correctly or are confused. Shortness of breath or difficulty breathing. Dizziness and fainting. You get a rash or develop hives. You have a decrease in urine output. Your urine turns a dark color or changes to pink, red, or brown. Any of the following symptoms occur over the next 10 days: You have a temperature by mouth above 102 F (38.9 C), not controlled by medicine. Shortness of breath. Weakness after normal activity. The white part of the eye turns yellow (jaundice). You have a decrease in the amount of urine or are urinating less often. Your urine turns a dark color or changes to pink, red, or brown. Document Released: 11/25/2000 Document Revised: 02/20/2012 Document Reviewed: 07/14/2008 [facility] Patient Information 2014  [place], [place].  _______________________________________________________________________

## 2021-10-28 NOTE — Anesthesia Preprocedure Evaluation (Addendum)
Anesthesia Evaluation  Patient identified by MRN, date of birth, ID band Patient awake    Reviewed: Allergy & Precautions  History of Anesthesia Complications (+) PONV  Airway Mallampati: I       Dental  (+) Edentulous Upper, Edentulous Lower   Pulmonary    Pulmonary exam normal        Cardiovascular hypertension,  Rhythm:Regular Rate:Normal + Systolic murmurs    Neuro/Psych    GI/Hepatic   Endo/Other    Renal/GU      Musculoskeletal   Abdominal Normal abdominal exam  (+)   Peds  Hematology   Anesthesia Other Findings IMPRESSIONS    1. Left ventricular ejection fraction, by estimation, is 65 to 70%. Left  ventricular ejection fraction by 3D volume is 68 %. The left ventricle has  normal function. The left ventricle has no regional wall motion  abnormalities. Left ventricular diastolic  function could not be evaluated. There is the interventricular septum is  flattened in systole and diastole, consistent with right ventricular  pressure and volume overload.  2. Right ventricular systolic function is mildly reduced. The right  ventricular size is moderately enlarged. There is moderately elevated  pulmonary artery systolic pressure. The estimated right ventricular  systolic pressure is 44.0 mmHg.  3. Left atrial size was severely dilated.  4. Right atrial size was severely dilated.  5. The mitral valve is degenerative. Mild to moderate mitral valve  regurgitation. No evidence of mitral stenosis.  6. The tricuspid valve is abnormal. Tricuspid valve regurgitation is  moderate to severe.  7. The aortic valve is tricuspid. There is mild calcification of the  aortic valve. There is mild thickening of the aortic valve. Aortic valve  regurgitation is not visualized. Mild aortic valve sclerosis is present,  with no evidence of aortic valve  stenosis.  8. The inferior vena cava is normal in size with <50%  respiratory  variability, suggesting right atrial pressure of 8 mmHg.   FINDINGS  Left Ventricle: Left ventricular ejection fraction, by estimation, is 65  to 70%. Left ventricular ejection fraction by 3D volume is 68 %. The left  ventricle has normal function. The left ventricle has no regional wall  motion abnormalities. The left  ventricular internal cavity size was normal in size. There is no left  ventricular hypertrophy. The interventricular septum is flattened in  systole and diastole, consistent with right ventricular pressure and  volume overload. Left ventricular diastolic  function could not be evaluated due to atrial fibrillation. Left  ventricular diastolic function could not be evaluated.   Right Ventricle: The right ventricular size is moderately enlarged. No  increase in right ventricular wall thickness. Right ventricular systolic  function is mildly reduced. There is moderately elevated pulmonary artery  systolic pressure. The tricuspid  regurgitant velocity is 3.21 m/s, and with an assumed right atrial  pressure of 8 mmHg, the estimated right ventricular systolic pressure is  10.2 mmHg.   Left Atrium: Left atrial size was severely dilated.   Right Atrium: Right atrial size was severely dilated.   Pericardium: Trivial pericardial effusion is present. Presence of  pericardial fat pad.   Mitral Valve: The mitral valve is degenerative in appearance. There is  mild calcification of the anterior mitral valve leaflet(s). Mild mitral  annular calcification. Mild to moderate mitral valve regurgitation. No  evidence of mitral valve stenosis.   Tricuspid Valve: The tricuspid valve is abnormal. Tricuspid valve  regurgitation is moderate to severe. No evidence of tricuspid stenosis.  Aortic Valve: The aortic valve is tricuspid. There is mild calcification  of the aortic valve. There is mild thickening of the aortic valve. Aortic  valve regurgitation is not visualized.  Mild aortic valve sclerosis is  present, with no evidence of aortic  valve stenosis.   Pulmonic Valve: The pulmonic valve was grossly normal. Pulmonic valve  regurgitation is mild. No evidence of pulmonic stenosis.   Aorta: The aortic root and ascending aorta are structurally normal, with  no evidence of dilitation.   Venous: The inferior vena cava is normal in size with less than 50%  respiratory variability, suggesting right atrial pressure of 8 mmHg.   IAS/Shunts: The atrial septum is grossly normal.     LEFT VENTRICLE  PLAX 2D  LVIDd:     4.00 cm     Diastology  LVIDs:     2.20 cm     LV e' medial:  8.81 cm/s  LV PW:     1.00 cm     LV E/e' medial: 14.2  LV IVS:    0.80 cm     LV e' lateral:  12.30 cm/s  LVOT diam:   2.00 cm     LV E/e' lateral: 10.2  LV SV:     68  LV SV Index:  42  LVOT Area:   3.14 cm    3D Volume EF                 LV 3D EF:  Left                       ventricular                       ejection                       fraction by                       3D volume                       is 68 %.                   3D Volume EF:                 3D EF:    68 %                 LV EDV:    65 ml                 LV ESV:    20 ml                 LV SV:    44 ml   RIGHT VENTRICLE  RV S prime:   11.20 cm/s   LEFT ATRIUM       Index    RIGHT ATRIUM      Index  LA diam:    4.80 cm 2.94 cm/m RA Area:   32.50 cm  LA Vol (A2C):  92.6 ml 56.77 ml/m RA Volume:  116.00 ml 71.12 ml/m  LA Vol (A4C):  94.1 ml 57.69 ml/m  LA Biplane Vol: 94.6 ml 58.00 ml/m  AORTIC VALVE  LVOT Vmax:  87.95 cm/s  LVOT Vmean: 60.250 cm/s  LVOT VTI:  0.216 m    AORTA  Ao  Root diam: 3.40 cm  Ao Asc diam: 3.10 cm   MITRAL VALVE        TRICUSPID VALVE  MV Area (PHT): cm     TR Peak grad:  41.2 mmHg  MV Decel Time: 213 msec   TR Vmax:    321.00 cm/s  MR Peak grad: 118.8 mmHg  MR Mean grad: 69.0 mmHg   SHUNTS  MR Vmax:   545.00 cm/s  Systemic VTI: 0.22 m  MR Vmean:   386.0 cm/s  Systemic Diam: 2.00 cm  MV E velocity: 125.00 cm/s   Eleonore Chiquito MD  Electronically signed by Eleonore Chiquito MD  Signature Date/Time: 01/18/2021/3:47:20 PM      Final   Imaging Info  ECHOCARDIOGRAM COMPLETE (Order #[id number]) on 01/18/21  Study History  ECHOCARDIOGRAM COMPLETE (Order #[id number]) on 01/18/21  [employer] Images  Show images for ECHOCARDIOGRAM COMPLETE Images on [employer]  Show images for Salley Slaughter Performing Technologist/Nurse  Performing Technologist/Nurse: Deliah Boston D Reason for Exam Priority: Routine Dx: Dyspnea on exertion (R06.09 (ICD-10-CM)); SOB (shortness of breath) (R06.02 (ICD-10-CM))  Surgical History  Surgical History   No past medical history on file.  Other Surgical History   Procedure Laterality Date Comment Source 2D ECHOCARDIOGRAM  12/07/2004 EF 48%, moderate pulmonary hypertension  BREAST BIOPSY Left ?1970's   CARDIOVASCULAR STRESS TEST  12/07/11   CARDIOVASCULAR STRESS TEST  12/07/2011 LEXISCAN, normal scan, no significant wall abnormalities noted  CARPAL TUNNEL RELEASE Right ~ 2005   CATARACT EXTRACTION, BILATERAL Bilateral 2000   DILATION AND CURETTAGE OF UTERUS  1950's   DOPPLER ECHOCARDIOGRAPHY  12/07/11   EYE SURGERY     FEMUR IM NAIL Right 08/01/2015 Procedure: INTRAMEDULLARY (IM) NAIL FEMORAL; Surgeon: Marchia Bond, MD; Location: [place]; Service: Orthopedics; Laterality: Right;  HARDWARE REMOVAL Right 03/16/2016 Procedure: HARDWARE REMOVAL; Surgeon: Marchia Bond, MD; Location: [place]; Service: Orthopedics; Laterality: Right;  INTRAMEDULLARY (IM) NAIL  INTERTROCHANTERIC Left 01/26/2017 Procedure: INTRAMEDULLARY (IM) NAIL INTERTROCHANTRIC; Surgeon: Leandrew Koyanagi, MD; Location: [place]; Service: Orthopedics; Laterality: Left;  JOINT REPLACEMENT   bilateral knee replacement  SKIN CANCER EXCISION  2000's "off my nose" (08/13/2013)  TOTAL HIP ARTHROPLASTY Right 03/16/2016 Procedure: TOTAL HIP ARTHROPLASTY; Surgeon: Marchia Bond, MD; Location: [place]; Service: Orthopedics; Laterality: Right;  TOTAL HIP ARTHROPLASTY Left 06/09/2017 Procedure: REMOVE LEFT HIP INTRAMEDULLARY NAIL AND CONVERT TO LEFT CEMENTED HEMI HIP ARTHROPLASTY; Surgeon: Leandrew Koyanagi, MD; Location: [place]; Service: Orthopedics; Laterality: Left;  TOTAL KNEE ARTHROPLASTY Right 2005   TOTAL KNEE ARTHROPLASTY  03/02/2012 Procedure: TOTAL KNEE ARTHROPLASTY; Surgeon: Yvette Rack., MD; Location: [place]; Service: Orthopedics; Laterality: Left;    Implants  Type Not Specified Capt Hip Hemi 2 - [id number] - Capitated Charge Entry (Right)  Inventory item: CAPT HIP HEMI 2 Model/Cat number: [id number] Manufacturer: [employer]   As of 03/16/2016  Status: Capitated Charge Entry     Encounter-Level Documents on 01/18/2021:  Electronic signature on 01/18/2021 11:34 AM - E-signed Electronic signature on 01/18/2021 11:33 AM - 1 of 3 e-signatures recorded   Order-Level Documents on 01/18/2021:  Scan on 01/18/2021 3:47 PM by Default, Provider, MD   Resulted by:  Signed Date/Time  Phone Pager Eleonore Chiquito [facility] 01/18/2021 3:47 PM [phone]   External Result Report  External Result Report   ECHOCARDIOGRAM COMPLETE: Patient Communication  Append Comments Seen   Ms. Hammar ECHO has been reviewed, pumping function is normal at 65-70%. Her right ventricular function is mildly reduced. Left and right ventricles  were severely dilated, her mitral valve was noted to have mild-moderate regurgitation(valve doesn't close tightly, allowing blood to pool or flow backward in  your heart), tricuspid valve showed moderate to severe regurgitation, mild calcification(blockage) was noted on her aortic valve and mild aortic sclerosis(stiffening) was also seen. No further testing is needed at this time. We will continue current medication regimen. Thank you. Written by Waylan Rocher, LPN on 06/13/5788 7:84 AM EST Seen by patient Salley Slaughter on 01/19/2021 8:21 AM    Existing Charges  Charge Line Charge Code Status Charge Trigger Charge Type [id number] [facility] Tte 2d Echo Complete ([id number]) [facility] Billing Imaging end exam Technical [id number] [facility] 3d Rendering at [place] ([id number]) [address] [facility] Billing Imaging end exam Technical [id number] Echo [place] [facility] result study Professional [id number] 3d Rendering W/Interp & Postprocess Supervision [place] Imaging result study Professional     Reproductive/Obstetrics                           Anesthesia Physical Anesthesia Plan  ASA: 2  Anesthesia Plan: General   Post-op Pain Management:    Induction: Intravenous  PONV Risk Score and Plan: 4 or greater and Ondansetron  Airway Management Planned: Oral ETT  Additional Equipment: None  Intra-op Plan:   Post-operative Plan: Extubation in OR  Informed Consent: I have reviewed the patients History and Physical, chart, labs and discussed the procedure including the risks, benefits and alternatives for the proposed anesthesia with the patient or authorized representative who has indicated his/her understanding and acceptance.     Dental advisory given  Plan Discussed with: CRNA  Anesthesia Plan Comments: (See PAT note 10/27/2021, Konrad Felix Ward, PA-C)       Anesthesia Quick Evaluation

## 2021-10-28 NOTE — Progress Notes (Signed)
Anesthesia Chart Review   Case: [id number] Date/Time: 11/02/21 1053   Procedures:      XI ROBOTIC ASSISTED BILATERAL SALPINGO OOPHORECTOMY (Bilateral)     POSSIBLE XI ROBOTIC ASSISTED TOTAL HYSTERECTOMY WITH POSSIBLE LAPAROTOMY   Anesthesia type: General   Pre-op diagnosis: PELVIC MASS   Location: [facility] ROOM 03 / [facility]   Surgeons: Lafonda Mosses, MD       DISCUSSION:87 y.o. never smoker with h/o PONV, HTN, GERD, atrial fibrillation (on Eliquis), pelvic mass scheduled for above procedure 11/02/21 with Dr. [name].   Pt seen by cardiology 10/20/2021. Per OV note, "Chart reviewed as part of pre-operative protocol coverage. Given past medical history and time since last visit, based on ACC/AHA guidelines, Leah Holland would be at acceptable risk for the planned procedure without further cardiovascular testing.    Her RCRI is a class II risk, 0.9% risk of major cardiac event.  She is able to complete greater than 4 METS of physical activity."  Advised to hold Eliquis 3 days prior to procedure.   Anticipate pt can proceed with planned procedure barring acute status change.   VS: BP (!) 145/69   Pulse 73   Temp 36.8 C (Oral)   Resp 16   Ht 5\' 2"  (1.575 m)   Wt 58.5 kg   SpO2 96%   BMI 23.59 kg/m   PROVIDERS: [name], MD is PCP   Quay Burow, MD is Cardiologist  LABS: Labs reviewed: Acceptable for surgery. (all labs ordered are listed, but only abnormal results are displayed)  Labs Reviewed  COMPREHENSIVE METABOLIC PANEL - Abnormal; Notable for the following components:      Result Value   Chloride 96 (*)    Glucose, Bld 105 (*)    BUN 26 (*)    Creatinine, Ser 1.01 (*)    Calcium 10.7 (*)    GFR, Estimated 51 (*)    All other components within normal limits  URINALYSIS, ROUTINE W REFLEX MICROSCOPIC - Abnormal; Notable for the following components:   APPearance HAZY (*)    All other components within normal limits  HEMOGLOBIN A1C - Abnormal;  Notable for the following components:   Hgb A1c MFr Bld 5.9 (*)    All other components within normal limits  GLUCOSE, CAPILLARY - Abnormal; Notable for the following components:   Glucose-Capillary 105 (*)    All other components within normal limits  CBC  TYPE AND SCREEN     IMAGES:   EKG: 08/20/2021 Rate 69 bpm  Atrial fibrillation with a competing junctional pacemaker Anteroseptal infarct, age undetermined   CV: Echo 01/18/2021 1. Left ventricular ejection fraction, by estimation, is 65 to 70%. Left  ventricular ejection fraction by 3D volume is 68 %. The left ventricle has  normal function. The left ventricle has no regional wall motion  abnormalities. Left ventricular diastolic   function could not be evaluated. There is the interventricular septum is  flattened in systole and diastole, consistent with right ventricular  pressure and volume overload.   2. Right ventricular systolic function is mildly reduced. The right  ventricular size is moderately enlarged. There is moderately elevated  pulmonary artery systolic pressure. The estimated right ventricular  systolic pressure is 83.3 mmHg.   3. Left atrial size was severely dilated.   4. Right atrial size was severely dilated.   5. The mitral valve is degenerative. Mild to moderate mitral valve  regurgitation. No evidence of mitral stenosis.   6. The  tricuspid valve is abnormal. Tricuspid valve regurgitation is  moderate to severe.   7. The aortic valve is tricuspid. There is mild calcification of the  aortic valve. There is mild thickening of the aortic valve. Aortic valve  regurgitation is not visualized. Mild aortic valve sclerosis is present,  with no evidence of aortic valve  stenosis.   8. The inferior vena cava is normal in size with <50% respiratory  variability, suggesting right atrial pressure of 8 mmHg.   Stress Test 03/11/2016 IMPRESSION: 1. No reversible ischemia or infarction.   2. Normal left  ventricular wall motion.   3. Left ventricular ejection fraction 77%   4. Low-risk stress test findings*. Past Medical History:  Diagnosis Date   Anxiety attack    "take RX prn" (08/13/2013)   Arthritis    "probably" (08/13/2013)   Atrial fibrillation ([place])    new onset/pt report 08/13/2013   Bladder wall thickening 07/01/2021   CT scan in Epic   Bronchitis 12/2011   Cholelithiasis 07/01/2021   CT in Epic.   Common bile duct dilation 07/01/2021   CT in Epic:  13 mm.   Complication of anesthesia    Compression fracture of first lumbar vertebra ([place]) 07/01/2021   CT in Epic.   Depression    Femur fracture ([facility])    left proximal femur non-union   GERD (gastroesophageal reflux disease)    H/O hiatal hernia    Hardware complicating wound infection ([place]) 08/22/2016   History of blood transfusion    "w/both knee OR's" (08/13/2013)   Hyperlipidemia    Hyperlipidemia    Hypertension    Hypertensive urgency 10/22/2018   Hypothyroidism    takes synthroid   Insomnia    Intertrochanteric fracture of right hip ([place]) 07/30/2015   Neuromuscular disorder ([facility])    carpal tunnel in right hand   Periprosthetic fracture around internal prosthetic right hip joint ([place]), nonunion basicervical femoral neck with broken hardware and subtrochanteric fracture 03/16/2016   PONV (postoperative nausea and vomiting)    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 Gwenlyn Found, cardiologist 1 x year [phone]   Tendon dysfunction 08/22/2016   Wears hearing aid    [facility]    Past Surgical History:  Procedure Laterality Date   2D ECHOCARDIOGRAM  12/07/2004   EF 48%, moderate pulmonary hypertension   BREAST BIOPSY Left ?1970's   CARDIOVASCULAR STRESS TEST  12/07/11   CARDIOVASCULAR STRESS TEST  12/07/2011    LEXISCAN, normal scan, no significant wall abnormalities noted   CARPAL TUNNEL RELEASE Right ~ 2005   CATARACT EXTRACTION, BILATERAL Bilateral 2000   DILATION AND CURETTAGE OF UTERUS  1950's   DOPPLER ECHOCARDIOGRAPHY  12/07/11   EYE SURGERY     FEMUR IM NAIL Right 08/01/2015   Procedure: INTRAMEDULLARY (IM) NAIL FEMORAL;  Surgeon: Marchia Bond, MD;  Location: [place];  Service: Orthopedics;  Laterality: Right;   HARDWARE REMOVAL Right 03/16/2016   Procedure: HARDWARE REMOVAL;  Surgeon: Marchia Bond, MD;  Location: [place];  Service: Orthopedics;  Laterality: Right;   INTRAMEDULLARY (IM) NAIL INTERTROCHANTERIC Left 01/26/2017   Procedure: INTRAMEDULLARY (IM) NAIL INTERTROCHANTRIC;  Surgeon: Leandrew Koyanagi, MD;  Location: [place];  Service: Orthopedics;  Laterality: Left;   JOINT REPLACEMENT  bilateral knee replacement   SKIN CANCER EXCISION  2000's   "off my nose" (08/13/2013)   TOTAL HIP ARTHROPLASTY Right 03/16/2016   Procedure: TOTAL HIP ARTHROPLASTY;  Surgeon: Marchia Bond, MD;  Location: [place];  Service: Orthopedics;  Laterality: Right;   TOTAL HIP ARTHROPLASTY Left 06/09/2017   Procedure: REMOVE LEFT HIP INTRAMEDULLARY NAIL AND CONVERT TO LEFT CEMENTED HEMI HIP ARTHROPLASTY;  Surgeon: Leandrew Koyanagi, MD;  Location: [place];  Service: Orthopedics;  Laterality: Left;   TOTAL KNEE ARTHROPLASTY Right 2005   TOTAL KNEE ARTHROPLASTY  03/02/2012   Procedure: TOTAL KNEE ARTHROPLASTY;  Surgeon: Yvette Rack., MD;  Location: [place];  Service: Orthopedics;  Laterality: Left;    MEDICATIONS:  acetaminophen (TYLENOL) 325 MG tablet   chlorthalidone (HYGROTON) 25 MG tablet   Cholecalciferol 2000 units CAPS   denosumab (PROLIA) 60 MG/ML SOLN injection   diltiazem (CARDIZEM CD) 180 MG 24 hr capsule   ELIQUIS 2.5 MG TABS tablet   Flaxseed, Linseed, (FLAXSEED OIL) 1000 MG CAPS   furosemide (LASIX) 20 MG tablet   levothyroxine (SYNTHROID) 112 MCG tablet   magnesium hydroxide (MILK OF MAGNESIA) 400 MG/5ML  suspension   metoprolol (LOPRESSOR) 50 MG tablet   Multiple Vitamins-Minerals (PRESERVISION AREDS 2+MULTI VIT PO)   potassium chloride SA (KLOR-CON) 20 MEQ tablet   senna (SENOKOT) 8.6 MG tablet   sulfamethoxazole-trimethoprim (BACTRIM) 400-80 MG tablet   temazepam (RESTORIL) 30 MG capsule   traMADol (ULTRAM) 50 MG tablet   No current facility-administered medications for this encounter.     Konrad Felix Ward, PA-C [facility] [phone]

## 2021-10-29 ENCOUNTER — Other Ambulatory Visit: Payer: Self-pay | Admitting: Emergency Medicine

## 2021-10-29 LAB — SARS CORONAVIRUS 2 (TAT 6-24 HRS): SARS Coronavirus 2: NEGATIVE

## 2021-11-01 ENCOUNTER — Telehealth: Payer: Self-pay

## 2021-11-01 NOTE — Telephone Encounter (Signed)
Left message requesting return call

## 2021-11-02 ENCOUNTER — Encounter (HOSPITAL_COMMUNITY): Payer: Self-pay | Admitting: Gynecologic Oncology

## 2021-11-02 ENCOUNTER — Encounter (HOSPITAL_COMMUNITY)
Admission: RE | Disposition: A | Discharge status: Home / Self Care | Payer: Self-pay | Source: Home / Self Care | Attending: Gynecologic Oncology

## 2021-11-02 ENCOUNTER — Ambulatory Visit (HOSPITAL_COMMUNITY): Payer: Medicare Other | Admitting: Physician Assistant

## 2021-11-02 ENCOUNTER — Ambulatory Visit (HOSPITAL_COMMUNITY)
Admission: RE | Admit: 2021-11-02 | Discharge: 2021-11-03 | Disposition: A | Discharge status: Home / Self Care | Payer: Medicare Other | Attending: Gynecologic Oncology | Admitting: Gynecologic Oncology

## 2021-11-02 ENCOUNTER — Other Ambulatory Visit: Payer: Self-pay

## 2021-11-02 ENCOUNTER — Ambulatory Visit (HOSPITAL_COMMUNITY): Payer: Medicare Other | Admitting: Certified Registered Nurse Anesthetist

## 2021-11-02 DIAGNOSIS — E871 Hypo-osmolality and hyponatremia: Secondary | ICD-10-CM | POA: Diagnosis not present

## 2021-11-02 DIAGNOSIS — K5730 Diverticulosis of large intestine without perforation or abscess without bleeding: Secondary | ICD-10-CM | POA: Insufficient documentation

## 2021-11-02 DIAGNOSIS — C541 Malignant neoplasm of endometrium: Secondary | ICD-10-CM

## 2021-11-02 DIAGNOSIS — Z96643 Presence of artificial hip joint, bilateral: Secondary | ICD-10-CM | POA: Insufficient documentation

## 2021-11-02 DIAGNOSIS — I10 Essential (primary) hypertension: Secondary | ICD-10-CM | POA: Diagnosis not present

## 2021-11-02 DIAGNOSIS — Z85828 Personal history of other malignant neoplasm of skin: Secondary | ICD-10-CM | POA: Diagnosis not present

## 2021-11-02 DIAGNOSIS — R1909 Other intra-abdominal and pelvic swelling, mass and lump: Secondary | ICD-10-CM | POA: Diagnosis not present

## 2021-11-02 DIAGNOSIS — N838 Other noninflammatory disorders of ovary, fallopian tube and broad ligament: Secondary | ICD-10-CM | POA: Insufficient documentation

## 2021-11-02 DIAGNOSIS — Z96653 Presence of artificial knee joint, bilateral: Secondary | ICD-10-CM | POA: Insufficient documentation

## 2021-11-02 DIAGNOSIS — Z96651 Presence of right artificial knee joint: Secondary | ICD-10-CM | POA: Diagnosis not present

## 2021-11-02 DIAGNOSIS — R1900 Intra-abdominal and pelvic swelling, mass and lump, unspecified site: Secondary | ICD-10-CM | POA: Diagnosis present

## 2021-11-02 DIAGNOSIS — L929 Granulomatous disorder of the skin and subcutaneous tissue, unspecified: Secondary | ICD-10-CM | POA: Diagnosis not present

## 2021-11-02 DIAGNOSIS — N736 Female pelvic peritoneal adhesions (postinfective): Secondary | ICD-10-CM | POA: Diagnosis not present

## 2021-11-02 DIAGNOSIS — K668 Other specified disorders of peritoneum: Secondary | ICD-10-CM | POA: Diagnosis not present

## 2021-11-02 DIAGNOSIS — N839 Noninflammatory disorder of ovary, fallopian tube and broad ligament, unspecified: Secondary | ICD-10-CM | POA: Diagnosis not present

## 2021-11-02 DIAGNOSIS — I700 Atherosclerosis of aorta: Secondary | ICD-10-CM | POA: Diagnosis not present

## 2021-11-02 DIAGNOSIS — E785 Hyperlipidemia, unspecified: Secondary | ICD-10-CM | POA: Diagnosis not present

## 2021-11-02 DIAGNOSIS — N135 Crossing vessel and stricture of ureter without hydronephrosis: Secondary | ICD-10-CM | POA: Insufficient documentation

## 2021-11-02 DIAGNOSIS — C542 Malignant neoplasm of myometrium: Secondary | ICD-10-CM | POA: Diagnosis not present

## 2021-11-02 DIAGNOSIS — N719 Inflammatory disease of uterus, unspecified: Secondary | ICD-10-CM | POA: Insufficient documentation

## 2021-11-02 DIAGNOSIS — K219 Gastro-esophageal reflux disease without esophagitis: Secondary | ICD-10-CM | POA: Diagnosis not present

## 2021-11-02 DIAGNOSIS — N83291 Other ovarian cyst, right side: Secondary | ICD-10-CM | POA: Diagnosis not present

## 2021-11-02 HISTORY — PX: ROBOTIC ASSISTED TOTAL HYSTERECTOMY: SHX6085

## 2021-11-02 HISTORY — PX: CYSTOSCOPY: SHX5120

## 2021-11-02 HISTORY — PX: ROBOTIC ASSISTED BILATERAL SALPINGO OOPHERECTOMY: SHX6078

## 2021-11-02 LAB — TYPE AND SCREEN
ABO/RH(D): O NEG
Antibody Screen: NEGATIVE

## 2021-11-02 SURGERY — SALPINGO-OOPHORECTOMY, BILATERAL, ROBOT-ASSISTED
Anesthesia: General

## 2021-11-02 MED ORDER — CEFAZOLIN SODIUM-DEXTROSE 2-4 GM/100ML-% IV SOLN
INTRAVENOUS | Status: AC
  Filled 2021-11-02: qty 100

## 2021-11-02 MED ORDER — STERILE WATER FOR IRRIGATION IR SOLN
Status: DC | PRN
  Administered 2021-11-02: 1000 mL

## 2021-11-02 MED ORDER — ROCURONIUM BROMIDE 10 MG/ML (PF) SYRINGE
PREFILLED_SYRINGE | INTRAVENOUS | Status: DC | PRN
  Administered 2021-11-02: 50 mg via INTRAVENOUS

## 2021-11-02 MED ORDER — LACTATED RINGERS IR SOLN
Status: DC | PRN
  Administered 2021-11-02: 1

## 2021-11-02 MED ORDER — ENOXAPARIN SODIUM 30 MG/0.3ML IJ SOSY
30.0000 mg | PREFILLED_SYRINGE | INTRAMUSCULAR | Status: DC
  Administered 2021-11-03: 30 mg via SUBCUTANEOUS
  Filled 2021-11-02: qty 0.3

## 2021-11-02 MED ORDER — FENTANYL CITRATE PF 50 MCG/ML IJ SOSY: 12.5000 ug | PREFILLED_SYRINGE | INTRAMUSCULAR | Status: DC | PRN

## 2021-11-02 MED ORDER — LEVOTHYROXINE SODIUM 112 MCG PO TABS
112.0000 ug | ORAL_TABLET | Freq: Every day | ORAL | Status: DC
  Administered 2021-11-03: 112 ug via ORAL
  Filled 2021-11-02: qty 1

## 2021-11-02 MED ORDER — SODIUM CHLORIDE 0.9 % IR SOLN
Status: DC | PRN
  Administered 2021-11-02: 1000 mL via INTRAVESICAL

## 2021-11-02 MED ORDER — FENTANYL CITRATE PF 50 MCG/ML IJ SOSY
PREFILLED_SYRINGE | INTRAMUSCULAR | Status: AC
  Filled 2021-11-02: qty 1

## 2021-11-02 MED ORDER — DEXAMETHASONE SODIUM PHOSPHATE 4 MG/ML IJ SOLN: 4.0000 mg | INTRAMUSCULAR | Status: DC

## 2021-11-02 MED ORDER — ONDANSETRON HCL 4 MG/2ML IJ SOLN
INTRAMUSCULAR | Status: DC | PRN
  Administered 2021-11-02: 4 mg via INTRAVENOUS

## 2021-11-02 MED ORDER — PHENYLEPHRINE HCL-NACL 20-0.9 MG/250ML-% IV SOLN
INTRAVENOUS | Status: AC
  Filled 2021-11-02: qty 250

## 2021-11-02 MED ORDER — DEXAMETHASONE SODIUM PHOSPHATE 10 MG/ML IJ SOLN
INTRAMUSCULAR | Status: DC | PRN
  Administered 2021-11-02: 5 mg via INTRAVENOUS

## 2021-11-02 MED ORDER — ACETAMINOPHEN 500 MG PO TABS
1000.0000 mg | ORAL_TABLET | ORAL | Status: AC
  Administered 2021-11-02: 1000 mg via ORAL
  Filled 2021-11-02: qty 2

## 2021-11-02 MED ORDER — INDIGOTINDISULFONATE SODIUM 8 MG/ML IJ SOLN
INTRAMUSCULAR | Status: AC
  Filled 2021-11-02: qty 5

## 2021-11-02 MED ORDER — ACETAMINOPHEN 500 MG PO TABS
1000.0000 mg | ORAL_TABLET | Freq: Two times a day (BID) | ORAL | Status: DC
  Administered 2021-11-02 – 2021-11-03 (×2): 1000 mg via ORAL
  Filled 2021-11-02 (×2): qty 2

## 2021-11-02 MED ORDER — ONDANSETRON HCL 4 MG/2ML IJ SOLN: 4.0000 mg | Freq: Four times a day (QID) | INTRAMUSCULAR | Status: DC | PRN

## 2021-11-02 MED ORDER — LACTATED RINGERS IV SOLN: INTRAVENOUS | Status: DC

## 2021-11-02 MED ORDER — FENTANYL CITRATE PF 50 MCG/ML IJ SOSY
25.0000 ug | PREFILLED_SYRINGE | INTRAMUSCULAR | Status: DC | PRN
  Administered 2021-11-02 (×2): 25 ug via INTRAVENOUS

## 2021-11-02 MED ORDER — HEPARIN SODIUM (PORCINE) 5000 UNIT/ML IJ SOLN
5000.0000 [IU] | INTRAMUSCULAR | Status: AC
  Administered 2021-11-02: 5000 [IU] via SUBCUTANEOUS
  Filled 2021-11-02: qty 1

## 2021-11-02 MED ORDER — FENTANYL CITRATE (PF) 250 MCG/5ML IJ SOLN
INTRAMUSCULAR | Status: DC | PRN
  Administered 2021-11-02 (×3): 25 ug via INTRAVENOUS
  Administered 2021-11-02 (×2): 50 ug via INTRAVENOUS

## 2021-11-02 MED ORDER — CEFAZOLIN SODIUM-DEXTROSE 2-3 GM-%(50ML) IV SOLR
INTRAVENOUS | Status: DC | PRN
  Administered 2021-11-02: 2 g via INTRAVENOUS

## 2021-11-02 MED ORDER — SENNOSIDES-DOCUSATE SODIUM 8.6-50 MG PO TABS
2.0000 | ORAL_TABLET | Freq: Every day | ORAL | Status: DC
  Administered 2021-11-02: 2 via ORAL
  Filled 2021-11-02: qty 2

## 2021-11-02 MED ORDER — HEMOSTATIC AGENTS (NO CHARGE) OPTIME
TOPICAL | Status: DC | PRN
  Administered 2021-11-02: 1 via TOPICAL

## 2021-11-02 MED ORDER — LIDOCAINE HCL (CARDIAC) PF 100 MG/5ML IV SOSY
PREFILLED_SYRINGE | INTRAVENOUS | Status: DC | PRN
  Administered 2021-11-02: 50 mg via INTRAVENOUS

## 2021-11-02 MED ORDER — CHLORHEXIDINE GLUCONATE 0.12 % MT SOLN
15.0000 mL | Freq: Once | OROMUCOSAL | Status: AC
  Administered 2021-11-02: 15 mL via OROMUCOSAL

## 2021-11-02 MED ORDER — INDIGOTINDISULFONATE SODIUM 8 MG/ML IJ SOLN
INTRAMUSCULAR | Status: DC | PRN
  Administered 2021-11-02: 5 mL via INTRAVENOUS

## 2021-11-02 MED ORDER — KCL IN DEXTROSE-NACL 20-5-0.45 MEQ/L-%-% IV SOLN
INTRAVENOUS | Status: DC
  Filled 2021-11-02 (×2): qty 1000

## 2021-11-02 MED ORDER — PROPOFOL 10 MG/ML IV BOLUS
INTRAVENOUS | Status: DC | PRN
  Administered 2021-11-02: 120 mg via INTRAVENOUS

## 2021-11-02 MED ORDER — SUGAMMADEX SODIUM 200 MG/2ML IV SOLN
INTRAVENOUS | Status: DC | PRN
  Administered 2021-11-02: 120 mg via INTRAVENOUS

## 2021-11-02 MED ORDER — DILTIAZEM HCL ER COATED BEADS 180 MG PO CP24
180.0000 mg | ORAL_CAPSULE | Freq: Every day | ORAL | Status: DC
  Administered 2021-11-03: 180 mg via ORAL
  Filled 2021-11-02: qty 1

## 2021-11-02 MED ORDER — PHENYLEPHRINE HCL-NACL 20-0.9 MG/250ML-% IV SOLN
INTRAVENOUS | Status: DC | PRN
  Administered 2021-11-02: 25 ug/min via INTRAVENOUS

## 2021-11-02 MED ORDER — ONDANSETRON HCL 4 MG PO TABS: 4.0000 mg | ORAL_TABLET | Freq: Four times a day (QID) | ORAL | Status: DC | PRN

## 2021-11-02 MED ORDER — ORAL CARE MOUTH RINSE: 15.0000 mL | Freq: Once | OROMUCOSAL | Status: AC

## 2021-11-02 MED ORDER — BUPIVACAINE LIPOSOME 1.3 % IJ SUSP
INTRAMUSCULAR | Status: AC
  Filled 2021-11-02: qty 20

## 2021-11-02 MED ORDER — IBUPROFEN 400 MG PO TABS
600.0000 mg | ORAL_TABLET | Freq: Four times a day (QID) | ORAL | Status: DC | PRN
  Administered 2021-11-02: 600 mg via ORAL
  Filled 2021-11-02: qty 1

## 2021-11-02 MED ORDER — ONDANSETRON HCL 4 MG/2ML IJ SOLN: 4.0000 mg | Freq: Once | INTRAMUSCULAR | Status: DC | PRN

## 2021-11-02 MED ORDER — FENTANYL CITRATE (PF) 250 MCG/5ML IJ SOLN
INTRAMUSCULAR | Status: AC
  Filled 2021-11-02: qty 5

## 2021-11-02 MED ORDER — TEMAZEPAM 15 MG PO CAPS
30.0000 mg | ORAL_CAPSULE | Freq: Every day | ORAL | Status: DC
  Administered 2021-11-02 (×2): 15 mg via ORAL
  Filled 2021-11-02 (×2): qty 2

## 2021-11-02 MED ORDER — METOPROLOL TARTRATE 50 MG PO TABS
50.0000 mg | ORAL_TABLET | Freq: Two times a day (BID) | ORAL | Status: DC
  Administered 2021-11-02 – 2021-11-03 (×2): 50 mg via ORAL
  Filled 2021-11-02 (×2): qty 1

## 2021-11-02 MED ORDER — BUPIVACAINE HCL 0.25 % IJ SOLN
INTRAMUSCULAR | Status: DC | PRN
  Administered 2021-11-02: 18 mL
  Administered 2021-11-02: 32 mL

## 2021-11-02 MED ORDER — BUPIVACAINE LIPOSOME 1.3 % IJ SUSP
INTRAMUSCULAR | Status: DC | PRN
  Administered 2021-11-02: 20 mL

## 2021-11-02 MED ORDER — BUPIVACAINE HCL 0.25 % IJ SOLN
INTRAMUSCULAR | Status: AC
  Filled 2021-11-02: qty 1

## 2021-11-02 MED ORDER — TRAMADOL HCL 50 MG PO TABS
100.0000 mg | ORAL_TABLET | Freq: Two times a day (BID) | ORAL | Status: DC | PRN
  Administered 2021-11-03: 100 mg via ORAL
  Filled 2021-11-02 (×2): qty 2

## 2021-11-02 SURGICAL SUPPLY — 93 items
ADH SKN CLS APL DERMABOND .7 (GAUZE/BANDAGES/DRESSINGS) ×3
AGENT HMST KT MTR STRL THRMB (HEMOSTASIS)
APL ESCP 34 STRL LF DISP (HEMOSTASIS)
APL SRG 38 LTWT LNG FL B (MISCELLANEOUS) ×3
APPLICATOR ARISTA FLEXITIP XL (MISCELLANEOUS) ×1 IMPLANT
APPLICATOR SURGIFLO ENDO (HEMOSTASIS) IMPLANT
BAG COUNTER SPONGE SURGICOUNT (BAG) IMPLANT
BAG LAPAROSCOPIC 12 15 PORT 16 (BASKET) IMPLANT
BAG RETRIEVAL 12/15 (BASKET) ×4
BAG SPEC RTRVL LRG 6X4 10 (ENDOMECHANICALS)
BAG SPNG CNTER NS LX DISP (BAG)
BLADE SURG SZ10 CARB STEEL (BLADE) ×1 IMPLANT
CATH FOLEY 2WAY 5CC 16FR (CATHETERS)
CATH FOLEY 2WAY SLVR  5CC 16FR (CATHETERS) ×4
CATH FOLEY 2WAY SLVR 5CC 16FR (CATHETERS) IMPLANT
CATH URTH STD 16FR FL 2W DRN (CATHETERS) IMPLANT
CELLS DAT CNTRL 66122 CELL SVR (MISCELLANEOUS) ×3 IMPLANT
COVER BACK TABLE 60X90IN (DRAPES) ×4 IMPLANT
COVER TIP SHEARS 8 DVNC (MISCELLANEOUS) ×3 IMPLANT
COVER TIP SHEARS 8MM DA VINCI (MISCELLANEOUS) ×4
DECANTER SPIKE VIAL GLASS SM (MISCELLANEOUS) ×1 IMPLANT
DERMABOND ADVANCED (GAUZE/BANDAGES/DRESSINGS) ×1
DERMABOND ADVANCED .7 DNX12 (GAUZE/BANDAGES/DRESSINGS) ×3 IMPLANT
DRAPE ARM DVNC X/XI (DISPOSABLE) ×12 IMPLANT
DRAPE COLUMN DVNC XI (DISPOSABLE) ×3 IMPLANT
DRAPE DA VINCI XI ARM (DISPOSABLE) ×16
DRAPE DA VINCI XI COLUMN (DISPOSABLE) ×4
DRAPE SHEET LG 3/4 BI-LAMINATE (DRAPES) ×4 IMPLANT
DRAPE SURG IRRIG POUCH 19X23 (DRAPES) ×4 IMPLANT
DRSG OPSITE POSTOP 4X6 (GAUZE/BANDAGES/DRESSINGS) ×1 IMPLANT
DRSG OPSITE POSTOP 4X8 (GAUZE/BANDAGES/DRESSINGS) IMPLANT
ELECT PENCIL ROCKER SW 15FT (MISCELLANEOUS) ×1 IMPLANT
ELECT REM PT RETURN 15FT ADLT (MISCELLANEOUS) ×4 IMPLANT
GAUZE 4X4 16PLY ~~LOC~~+RFID DBL (SPONGE) ×4 IMPLANT
GLOVE SRG 8 PF TXTR STRL LF DI (GLOVE) IMPLANT
GLOVE SURG ENC MOIS LTX SZ6 (GLOVE) ×16 IMPLANT
GLOVE SURG ENC MOIS LTX SZ6.5 (GLOVE) ×8 IMPLANT
GLOVE SURG ENC MOIS LTX SZ8 (GLOVE) ×1 IMPLANT
GLOVE SURG POLY ORTHO LF SZ7.5 (GLOVE) ×1 IMPLANT
GLOVE SURG POLYISO LF SZ6.5 (GLOVE) ×2 IMPLANT
GLOVE SURG UNDER POLY LF SZ6.5 (GLOVE) ×2 IMPLANT
GLOVE SURG UNDER POLY LF SZ8 (GLOVE) ×4
GOWN STRL REUS W/ TWL LRG LVL3 (GOWN DISPOSABLE) ×12 IMPLANT
GOWN STRL REUS W/TWL LRG LVL3 (GOWN DISPOSABLE) ×19 IMPLANT
GOWN STRL REUS W/TWL XL LVL3 (GOWN DISPOSABLE) ×1 IMPLANT
HEMOSTAT ARISTA ABSORB 3G PWDR (HEMOSTASIS) ×1 IMPLANT
HOLDER FOLEY CATH W/STRAP (MISCELLANEOUS) ×3 IMPLANT
IRRIG SUCT STRYKERFLOW 2 WTIP (MISCELLANEOUS) ×4
IRRIGATION SUCT STRKRFLW 2 WTP (MISCELLANEOUS) ×3 IMPLANT
IV LACTATED RINGERS 1000ML (IV SOLUTION) ×1 IMPLANT
IV NS 1000ML (IV SOLUTION) ×4
IV NS 1000ML BAXH (IV SOLUTION) IMPLANT
KIT PROCEDURE DA VINCI SI (MISCELLANEOUS)
KIT PROCEDURE DVNC SI (MISCELLANEOUS) IMPLANT
KIT TURNOVER KIT A (KITS) IMPLANT
MANIPULATOR ADVINCU DEL 3.0 PL (MISCELLANEOUS) IMPLANT
MANIPULATOR UTERINE 4.5 ZUMI (MISCELLANEOUS) ×4 IMPLANT
NDL HYPO 21X1.5 SAFETY (NEEDLE) ×3 IMPLANT
NDL SPNL 18GX3.5 QUINCKE PK (NEEDLE) IMPLANT
NEEDLE HYPO 21X1.5 SAFETY (NEEDLE) ×8 IMPLANT
NEEDLE SPNL 18GX3.5 QUINCKE PK (NEEDLE) IMPLANT
OBTURATOR OPTICAL STANDARD 8MM (TROCAR) ×4
OBTURATOR OPTICAL STND 8 DVNC (TROCAR) ×3
OBTURATOR OPTICALSTD 8 DVNC (TROCAR) ×3 IMPLANT
PACK ROBOT GYN CUSTOM WL (TRAY / TRAY PROCEDURE) ×4 IMPLANT
PAD POSITIONING PINK XL (MISCELLANEOUS) ×4 IMPLANT
PORT ACCESS TROCAR AIRSEAL 12 (TROCAR) ×3 IMPLANT
PORT ACCESS TROCAR AIRSEAL 5M (TROCAR) ×1
POUCH SPECIMEN RETRIEVAL 10MM (ENDOMECHANICALS) IMPLANT
RETRACTOR WND ALEXIS 18 MED (MISCELLANEOUS) IMPLANT
RTRCTR WOUND ALEXIS 18CM MED (MISCELLANEOUS) ×4
SCRUB EXIDINE 4% CHG 4OZ (MISCELLANEOUS) ×4 IMPLANT
SEAL CANN UNIV 5-8 DVNC XI (MISCELLANEOUS) ×12 IMPLANT
SEAL XI 5MM-8MM UNIVERSAL (MISCELLANEOUS) ×16
SET IRRIG Y TYPE TUR BLADDER L (SET/KITS/TRAYS/PACK) ×1 IMPLANT
SET TRI-LUMEN FLTR TB AIRSEAL (TUBING) ×4 IMPLANT
SPONGE T-LAP 18X18 ~~LOC~~+RFID (SPONGE) ×1 IMPLANT
SURGIFLO W/THROMBIN 8M KIT (HEMOSTASIS) IMPLANT
SUT MNCRL AB 4-0 PS2 18 (SUTURE) ×3 IMPLANT
SUT PDS AB 1 TP1 96 (SUTURE) ×2 IMPLANT
SUT VIC AB 0 CT1 27 (SUTURE) ×4
SUT VIC AB 0 CT1 27XBRD ANTBC (SUTURE) IMPLANT
SUT VIC AB 2-0 CT1 27 (SUTURE) ×4
SUT VIC AB 2-0 CT1 TAPERPNT 27 (SUTURE) IMPLANT
SUT VIC AB 4-0 PS2 18 (SUTURE) ×8 IMPLANT
SYR 10ML LL (SYRINGE) IMPLANT
TOWEL OR NON WOVEN STRL DISP B (DISPOSABLE) ×4 IMPLANT
TRAP SPECIMEN MUCUS 40CC (MISCELLANEOUS) ×1 IMPLANT
TRAY FOLEY MTR SLVR 16FR STAT (SET/KITS/TRAYS/PACK) ×4 IMPLANT
TROCAR XCEL NON-BLD 5MMX100MML (ENDOMECHANICALS) IMPLANT
UNDERPAD 30X36 HEAVY ABSORB (UNDERPADS AND DIAPERS) ×8 IMPLANT
WATER STERILE IRR 1000ML POUR (IV SOLUTION) ×4 IMPLANT
YANKAUER SUCT BULB TIP 10FT TU (MISCELLANEOUS) ×1 IMPLANT

## 2021-11-02 NOTE — Anesthesia Postprocedure Evaluation (Signed)
Anesthesia Post Note  Patient: Leah Holland  Procedure(s) Performed: XI ROBOTIC ASSISTED BILATERAL SALPINGO OOPHORECTOMY (Bilateral) XI ROBOTIC ASSISTED TOTAL HYSTERECTOMY WITH  MINI LAPAROTOMY CYSTOSCOPY     Patient location during evaluation: PACU Anesthesia Type: General Level of consciousness: awake and sedated Pain management: pain level controlled Vital Signs Assessment: post-procedure vital signs reviewed and stable Respiratory status: spontaneous breathing Cardiovascular status: stable Postop Assessment: no apparent nausea or vomiting Anesthetic complications: no   No notable events documented.  Last Vitals:  Vitals:   11/02/21 1345 11/02/21 1400  BP: (!) 147/79 (!) 151/85  Pulse: 69 64  Resp: 12 20  Temp:  36.5 C  SpO2: 97% 95%    Last Pain:  Vitals:   11/02/21 1400  TempSrc:   PainSc: 0-No pain                 John F Salome Arnt

## 2021-11-02 NOTE — Transfer of Care (Signed)
Immediate Anesthesia Transfer of Care Note  Patient: Leah Holland  Procedure(s) Performed: XI ROBOTIC ASSISTED BILATERAL SALPINGO OOPHORECTOMY (Bilateral) XI ROBOTIC ASSISTED TOTAL HYSTERECTOMY WITH  MINI LAPAROTOMY CYSTOSCOPY  Patient Location: PACU  Anesthesia Type:General  Level of Consciousness: drowsy and patient cooperative  Airway & Oxygen Therapy: Patient Spontanous Breathing and Patient connected to face mask oxygen  Post-op Assessment: Report given to RN and Post -op Vital signs reviewed and stable  Post vital signs: Reviewed and stable  Last Vitals:  Vitals Value Taken Time  BP 160/87 11/02/21 1256  Temp    Pulse 64 11/02/21 1258  Resp 11 11/02/21 1258  SpO2 94 % 11/02/21 1258  Vitals shown include unvalidated device data.  Last Pain:  Vitals:   11/02/21 0845  TempSrc:   PainSc: 0-No pain         Complications: No notable events documented.

## 2021-11-02 NOTE — Interval H&P Note (Signed)
History and Physical Interval Note:  11/02/2021 8:55 AM  Leah Holland  has presented today for surgery, with the diagnosis of PELVIC MASS.  The various methods of treatment have been discussed with the patient and family. After consideration of risks, benefits and other options for treatment, the patient has consented to  Procedure(s): XI ROBOTIC ASSISTED BILATERAL SALPINGO OOPHORECTOMY (Bilateral) POSSIBLE XI ROBOTIC ASSISTED TOTAL HYSTERECTOMY WITH POSSIBLE LAPAROTOMY (N/A) as a surgical intervention.  The patient's history has been reviewed, patient examined, no change in status, stable for surgery.  I have reviewed the patient's chart and labs.  Questions were answered to the patient's satisfaction.     Leah Holland

## 2021-11-02 NOTE — Anesthesia Procedure Notes (Signed)
Procedure Name: Intubation Date/Time: 11/02/2021 9:55 AM Performed by: Raenette Rover, CRNA Pre-anesthesia Checklist: Patient identified, Emergency Drugs available, Patient being monitored and Suction available Patient Re-evaluated:Patient Re-evaluated prior to induction Oxygen Delivery Method: Circle system utilized Preoxygenation: Pre-oxygenation with 100% oxygen Induction Type: IV induction Ventilation: Mask ventilation without difficulty and Oral airway inserted - appropriate to patient size Laryngoscope Size: Mac and 3 Grade View: Grade I Tube type: Oral Tube size: 7.0 mm Number of attempts: 1 Airway Equipment and Method: Stylet Placement Confirmation: ETT inserted through vocal cords under direct vision, positive ETCO2 and breath sounds checked- equal and bilateral Secured at: 20 cm Tube secured with: Tape Dental Injury: Teeth and Oropharynx as per pre-operative assessment

## 2021-11-02 NOTE — Op Note (Signed)
OPERATIVE NOTE  Pre-operative Diagnosis: Pelvic mass  Post-operative Diagnosis: Enlarged uterus, pyometra, atrophic adnexa, retroperitoneal fibrosis and edema, significant diverticular disease, powder burn lesions on the uterus and pelvic peritoneum c/w possible endometriosis  Operation: Robotic-assisted laparoscopic total hysterectomy with bilateral salpingoophorectomy, mini-lap for specimen removal, cystoscopy   Surgeon: [name] MD  Assistant Surgeon: Lahoma Crocker MD (an MD assistant was necessary for tissue manipulation, management of robotic instrumentation, retraction and positioning due to the complexity of the case and hospital policies).   Anesthesia: GET  Urine Output: 50cc  Operative Findings: On EUA, 10cm mobile mass that moves with the uterus. Narrow vaginal introitus. On intra-abdominal entry, somewhat nodular appearing liver. Normal appearing diaphragm, omentum, small and large bowel. Significant diverticular disease of the sigmoid colon noted. Uterus 10-12cm and bulbous, appearance of endosalpingiosis along posterior uterus, area of white almost necrotic appearing tissue that appears to be perforating the left posterior lateral uterine wall to the serosa. Atrophic adnexa. Significant edema of the retroperitoneum and fibrosis noted. Uterus morcellated in bag in a contained manner through mini-lap incision with pus noted, concerning for pyometra as reason for uterine enlargement. Multiple powder burn lesions noted along posterior uterus, pelvic peritoneum.  No ascites, adenopathy, other evidence of pelvic or abdominal disease. Good ureteral efflux noted from UOs - indigo carmine given but excellent flow noted before blue seen within the bladder.  Estimated Blood Loss:  100cc      Total IV Fluids: see I&O flowsheet         Specimens: uterus, cervix, bilateral tubes and ovaries, pelvic washings         Complications:  None; patient tolerated the procedure well.          Disposition: PACU - hemodynamically stable.  Procedure Details  The patient was seen in the Holding Room. The risks, benefits, complications, treatment options, and expected outcomes were discussed with the patient.  The patient concurred with the proposed plan, giving informed consent.  The site of surgery properly noted/marked. The patient was identified as Leah Holland and the procedure verified as a Robotic-assisted hysterectomy with bilateral salpingo oophorectomy, any other indicated procedures.   After induction of anesthesia, the patient was draped and prepped in the usual sterile manner. Patient was placed in supine position after anesthesia and draped and prepped in the usual sterile manner as follows: Her arms were tucked to her side with all appropriate precautions.  The shoulders were stabilized with padded shoulder blocks applied to the acromium processes.  The patient was placed in the semi-lithotomy position in [place].  The perineum and vagina were prepped with CholoraPrep. The patient was draped after the CholoraPrep had been allowed to dry for 3 minutes.  A Time Out was held and the above information confirmed.  The urethra was prepped with Betadine. Foley catheter was placed.  An EEA sizer was placed in the vagina. OG tube placement was confirmed and to suction.   Next, a 10 mm skin incision was made 1 cm below the subcostal margin in the midclavicular line.  The 5 mm Optiview port and scope was used for direct entry.  Opening pressure was under 10 mm CO2.  The abdomen was insufflated and the findings were noted as above.   At this point and all points during the procedure, the patient's intra-abdominal pressure did not exceed 15 mmHg. Next, an 8 mm skin incision was made superior to the umbilicus and a right and left port were placed about 8 cm  lateral to the robot port on the right and left side.  A fourth arm was placed on the right.  The 5 mm assist trocar was  exchanged for a 10-12 mm port. All ports were placed under direct visualization.  The patient was placed in steep Trendelenburg.  Bowel was folded away into the upper abdomen.  The robot was docked in the normal manner.  Pelvic washings collected and pelvis inspected.   The right and left peritoneum were opened parallel to the IP ligament to open the retroperitoneal spaces bilaterally. The round ligaments were transected. The ureter was noted to be on the medial leaf of the broad ligament.  The peritoneum above the ureter was incised and stretched and the infundibulopelvic ligament was skeletonized, cauterized and cut.    The posterior peritoneum was taken down to the level of the EEA sizer.  The anterior peritoneum was also taken down.  The bladder flap was created to the level of the EEA sizer.  The uterine artery on the right side was skeletonized, cauterized and cut in the normal manner.  A similar procedure was performed on the left.  The colpotomy was made and the uterus, cervix, bilateral ovaries and tubes were amputated and placed in an Endocatch bag to be delivered through an incision.  Pedicles were inspected and excellent hemostasis was achieved.    The colpotomy at the vaginal cuff was closed with Vicryl on a CT1 needle in running manner.  Irrigation was used and excellent hemostasis was achieved.  Given need to restart anticoagulation soon, Arista was placed over operative beds.   Robotic instruments were removed under direct visualization.  The robot was undocked.   The supraumbilical incision was extended to 3-4cm length using a scalpel and monopolar electrocautery was used to incise down to and through the fascia. The fascial incision was extended as well as the peritoneum.   With the abdomen still insufflated, the Endocatch bag was delivered through the mini-lap incision. A small Alexis retractor was then placed inside the Endocatch bag and inferior to the peritoneum. In a contained  manner, the uterus was then morcellated until it was delivered through the incision. Pus was noted during this procedure. The Alexis retractor was removed and the endocatch bag removed and handed off the field.   The fascia of the mini-lap incision was closed with running #1 looped PDS tied in the midlines. The subcutaneous tissue was irrigated and Exparel injected for local anesthesia. 2-0 Vicryl was used to close the subcutaneous tissue.  Attention was then turned below. The foley catheter was removed and cystoscopy was performed after filling the bladder with 350cc of sterile fluid. Given no initial efflux noted from ureteral orifices, Indigocarmine blue was given. Prior to seeing blue efflux, excellent efflux noted from the ureters. New foley catheter was replaced and surgeon's gloves were changed.  The fascia at the 10-12 mm port was closed with 0 Vicryl on a UR-5 needle.  The subcuticular tissue of all incisions was closed with 4-0 Vicryl and the skin was closed with 4-0 Monocryl in a subcuticular manner.  Dermabond was applied.    The vagina was swabbed with minimal bleeding noted.   All sponge, lap and needle counts were correct x  3.   The patient was transferred to the recovery room in stable condition.  [name], MD

## 2021-11-03 ENCOUNTER — Encounter (HOSPITAL_COMMUNITY): Payer: Self-pay | Admitting: Gynecologic Oncology

## 2021-11-03 DIAGNOSIS — K668 Other specified disorders of peritoneum: Secondary | ICD-10-CM | POA: Diagnosis not present

## 2021-11-03 DIAGNOSIS — N135 Crossing vessel and stricture of ureter without hydronephrosis: Secondary | ICD-10-CM | POA: Diagnosis not present

## 2021-11-03 DIAGNOSIS — E871 Hypo-osmolality and hyponatremia: Secondary | ICD-10-CM | POA: Diagnosis not present

## 2021-11-03 DIAGNOSIS — N719 Inflammatory disease of uterus, unspecified: Secondary | ICD-10-CM | POA: Diagnosis not present

## 2021-11-03 DIAGNOSIS — K5730 Diverticulosis of large intestine without perforation or abscess without bleeding: Secondary | ICD-10-CM | POA: Diagnosis not present

## 2021-11-03 DIAGNOSIS — C541 Malignant neoplasm of endometrium: Secondary | ICD-10-CM | POA: Diagnosis not present

## 2021-11-03 LAB — BASIC METABOLIC PANEL
Anion gap: 7 (ref 5–15)
Anion gap: 8 (ref 5–15)
BUN: 22 mg/dL (ref 8–23)
BUN: 22 mg/dL (ref 8–23)
CO2: 25 mmol/L (ref 22–32)
CO2: 27 mmol/L (ref 22–32)
Calcium: 8.9 mg/dL (ref 8.9–10.3)
Calcium: 8.9 mg/dL (ref 8.9–10.3)
Chloride: 97 mmol/L — ABNORMAL LOW (ref 98–111)
Chloride: 95 mmol/L — ABNORMAL LOW (ref 98–111)
Creatinine, Ser: 0.93 mg/dL (ref 0.44–1.00)
Creatinine, Ser: 0.99 mg/dL (ref 0.44–1.00)
GFR, Estimated: 56 mL/min — ABNORMAL LOW (ref >60)
GFR, Estimated: 52 mL/min — ABNORMAL LOW (ref >60)
Glucose, Bld: 226 mg/dL — ABNORMAL HIGH (ref 70–99)
Glucose, Bld: 137 mg/dL — ABNORMAL HIGH (ref 70–99)
Potassium: 3.9 mmol/L (ref 3.5–5.1)
Potassium: 4.2 mmol/L (ref 3.5–5.1)
Sodium: 129 mmol/L — ABNORMAL LOW (ref 135–145)
Sodium: 130 mmol/L — ABNORMAL LOW (ref 135–145)

## 2021-11-03 LAB — CBC
HCT: 35.5 % — ABNORMAL LOW (ref 36.0–46.0)
Hemoglobin: 11.5 g/dL — ABNORMAL LOW (ref 12.0–15.0)
MCH: 29.9 pg (ref 26.0–34.0)
MCHC: 32.4 g/dL (ref 30.0–36.0)
MCV: 92.4 fL (ref 80.0–100.0)
Platelets: 252 10*3/uL (ref 150–400)
RBC: 3.84 MIL/uL — ABNORMAL LOW (ref 3.87–5.11)
RDW: 13.9 % (ref 11.5–15.5)
WBC: 10.9 10*3/uL — ABNORMAL HIGH (ref 4.0–10.5)
nRBC: 0 % (ref 0.0–0.2)

## 2021-11-03 NOTE — Progress Notes (Signed)
Discharge instructions discussed with patient and family, verbalized agreement and understanding 

## 2021-11-03 NOTE — Progress Notes (Signed)
1 Day Post-Op Procedure(s) (LRB): XI ROBOTIC ASSISTED BILATERAL SALPINGO OOPHORECTOMY (Bilateral) XI ROBOTIC ASSISTED TOTAL HYSTERECTOMY WITH  MINI LAPAROTOMY (N/A) CYSTOSCOPY  Subjective: Patient reports no pain this am. She has stood at the side of the bed but has not ambulated. No IS at the bedside. Due to void since foley removal. She is waiting for breakfast and denies nausea or emesis. Denies chest pain, dyspnea, lightheadedness. Has not passed flatus. Both sons at the bedside. No concerns voiced.   Objective: Vital signs in last 24 hours: Temp:  [97.6 F (36.4 C)-98.9 F (37.2 C)] 98.1 F (36.7 C) (11/23 0525) Pulse Rate:  [64-82] 67 (11/23 0525) Resp:  [12-22] 16 (11/23 0525) BP: (135-166)/(70-93) 138/76 (11/23 0525) SpO2:  [92 %-97 %] 97 % (11/23 0525) Last BM Date: 11/01/21  Intake/Output from previous day: 11/22 0701 - 11/23 0700 In: 3315.2 [P.O.:840; I.V.:2425.2; IV Piggyback:50] Out: 2045 [Urine:1945; Blood:100]  Physical Examination (Dr. Berline Lopes): General: alert, cooperative, no distress, and HOH Resp: clear to auscultation bilaterally Cardio: regular rate and rhythm, S1, S2 normal, no murmur, click, rub or gallop GI: soft, non-tender; bowel sounds normal; no masses,  no organomegaly and incision: lap sites to the abdomen with dermabond in place intact with no drainage, mini laparotomy incision with op site dressing intact with no drainage underneath Extremities: extremities normal, atraumatic, no cyanosis or edema  Labs: WBC/Hgb/Hct/Plts:  10.9/11.5/35.5/252 (11/23 0420) BUN/Cr/glu/ALT/AST/amyl/lip:  22/0.93/--/--/--/--/-- (11/23 0420)  Assessment: 87 y.o. s/p Procedure(s): XI ROBOTIC ASSISTED BILATERAL SALPINGO OOPHORECTOMY XI ROBOTIC ASSISTED TOTAL HYSTERECTOMY WITH  MINI LAPAROTOMY CYSTOSCOPY: stable Pain:  Pain is well-controlled on PRN medications.  Heme: Hgb 11.5 and Hct 35.5 this am-appropriate compared with surgical losses and preop values.   ID: WBC  10.9. On bactrim chronically per Dr. Tommy Medal for hx of infected prosthetic joint.  CV: BP and HR stable. Continue to monitor with ordered vital signs until discharge.  GI:  Tolerating PO. Antiemetics ordered if needed.  GU: Due to void since foley removal. Adequate output reported. Creatinine 0.93 this am.    FEN: Sodium level of 129 this am. Plan for repeat Bmet later this am to assess for stability and to reassess glucose level as well.   Prophylaxis: SCDs turned on after assessment. Lovenox ordered.  Plan: Plan for recheck Bmet later this am If sodium has not decreased, plan for discharge today if meeting discharge criteria with lab recheck at the [facility] on Friday Encourage IS use, deep breathing, coughing Continue plan of care per Dr. Berline Lopes   LOS: 0 days    Lenna Sciara D Aleathea Pugmire 11/03/2021, 9:20 AM

## 2021-11-03 NOTE — Discharge Summary (Addendum)
Physician Discharge Summary  [name] MRN: [id number] DOB/AGE: [birth year] 87 y.o.  Admit date: 11/02/2021 Discharge date: 11/03/2021  Admission Diagnoses: Pelvic mass  Discharge Diagnoses:  Principal Problem:   Pelvic mass Active Problems:   Hyponatremia   Discharged Condition:  The patient is in good condition and stable for discharge.    Hospital Course: On 11/02/2021, the patient underwent the following: Procedure(s): XI ROBOTIC ASSISTED BILATERAL SALPINGO OOPHORECTOMY XI ROBOTIC ASSISTED TOTAL HYSTERECTOMY WITH  MINI LAPAROTOMY CYSTOSCOPY. The postoperative course was uneventful.  On POD1 labs, her Na+ was 129. On repeat labs later in the am, Na+ remained stable at 130 and glucose improved. She was discharged to home on postoperative day 1 tolerating a regular diet, ambulating, voiding, pain minimal. She was advised to come to the [facility] on Friday for a repeat Bmet to re-evaluate her sodium level for stability.   Consults: None  Significant Diagnostic Studies: Labs  Treatments: surgery: see above  Discharge Exam (am assessment performed by Dr. Berline Lopes): Blood pressure 138/76, pulse 67, temperature 98.1 F (36.7 C), temperature source Oral, resp. rate 16, height 5\' 2"  (1.575 m), weight 128 lb 15.5 oz (58.5 kg), SpO2 97 %. General appearance: alert, cooperative, no distress, and HOH, sons at the bedside Resp: clear to auscultation bilaterally Cardio: regular rate and rhythm, S1, S2 normal, no murmur, click, rub or gallop GI: soft, non-tender; bowel sounds normal; no masses,  no organomegaly Extremities: extremities normal, atraumatic, no cyanosis or edema Incision/Wound: Lap sites to the abdomen with dermabond intact with no drainage, mini laparotomy incision with op site dressing in place intact with no drainage noted underneath  Disposition: Discharge disposition: 01-Home or Self Care      Discharge Instructions     Call MD for:  difficulty  breathing, headache or visual disturbances   Complete by: As directed    Call MD for:  extreme fatigue   Complete by: As directed    Call MD for:  hives   Complete by: As directed    Call MD for:  persistant dizziness or light-headedness   Complete by: As directed    Call MD for:  persistant nausea and vomiting   Complete by: As directed    Call MD for:  redness, tenderness, or signs of infection (pain, swelling, redness, odor or green/yellow discharge around incision site)   Complete by: As directed    Call MD for:  severe uncontrolled pain   Complete by: As directed    Call MD for:  temperature >100.4   Complete by: As directed    Diet - low sodium heart healthy   Complete by: As directed    Discharge wound care:   Complete by: As directed    You will have a white honeycomb dressing over your larger incision. This dressing can be removed 5 days after surgery and you do not need to reapply a new dressing. Once you remove the dressing, you will notice that you have the surgical glue (dermabond) on the incision and this will peel off on its own. You can get this dressing wet in the shower the days after surgery prior to removal on the 5th day.   Driving Restrictions   Complete by: As directed    No driving for 1 week if you were cleared to drive before surgery.  Do not take narcotics and drive.   Increase activity slowly   Complete by: As directed    Lifting restrictions  Complete by: As directed    No lifting greater than 10 lbs, pushing, pulling, straining for 6 weeks.   Sexual Activity Restrictions   Complete by: As directed    No sexual activity, nothing in the vagina, for 8 weeks.      Allergies as of 11/03/2021       Reactions   Levaquin [levofloxacin] Other (See Comments)   tendinopathy   Morphine And Related Other (See Comments)   Made pt "wild"        Medication List     TAKE these medications    acetaminophen 325 MG tablet Commonly known as: TYLENOL Take  650 mg by mouth every 6 (six) hours as needed for moderate pain.   chlorthalidone 25 MG tablet Commonly known as: HYGROTON TAKE 1 TABLET ONCE DAILY.   Cholecalciferol 50 MCG (2000 UT) Caps Take 2,000 Units by mouth every evening.   denosumab 60 MG/ML Soln injection Commonly known as: PROLIA Inject 60 mg into the skin every 6 (six) months. Administer in upper arm, thigh, or abdomen   diltiazem 180 MG 24 hr capsule Commonly known as: CARDIZEM CD TAKE 1 CAPSULE DAILY.   Eliquis 2.5 MG Tabs tablet Generic drug: apixaban TAKE 1 TABLET BY MOUTH TWICE DAILY.   Flaxseed Oil 1000 MG Caps Take 1,000 mg by mouth 2 (two) times daily.   furosemide 20 MG tablet Commonly known as: LASIX Take 20 mg by mouth daily as needed for fluid or edema.   levothyroxine 112 MCG tablet Commonly known as: SYNTHROID Take 112 mcg by mouth daily before breakfast.   magnesium hydroxide 400 MG/5ML suspension Commonly known as: MILK OF MAGNESIA Take 30 mLs by mouth daily as needed for mild constipation.   metoprolol tartrate 50 MG tablet Commonly known as: LOPRESSOR Take 50 mg by mouth 2 (two) times daily.   potassium chloride SA 20 MEQ tablet Commonly known as: KLOR-CON Take 20 mEq by mouth daily as needed (Take with each furosemide dose).   PRESERVISION AREDS 2+MULTI VIT PO Take 1 capsule by mouth 2 (two) times daily.   senna 8.6 MG tablet Commonly known as: SENOKOT Take 2 tablets by mouth at bedtime.   sulfamethoxazole-trimethoprim 400-80 MG tablet Commonly known as: BACTRIM Take 1 tablet by mouth 2 (two) times daily.   temazepam 30 MG capsule Commonly known as: RESTORIL Take 1 capsule (30 mg total) by mouth at bedtime as needed for sleep. What changed: when to take this   traMADol 50 MG tablet Commonly known as: ULTRAM Take 1 tablet (50 mg total) by mouth every 6 (six) hours as needed for severe pain. Do not take and drive               Discharge Care Instructions  (From  admission, onward)           Start     Ordered   11/03/21 0000  Discharge wound care:       Comments: You will have a white honeycomb dressing over your larger incision. This dressing can be removed 5 days after surgery and you do not need to reapply a new dressing. Once you remove the dressing, you will notice that you have the surgical glue (dermabond) on the incision and this will peel off on its own. You can get this dressing wet in the shower the days after surgery prior to removal on the 5th day.   11/03/21 1210            Follow-up  Information     Lafonda Mosses, MD Follow up on 11/22/2021.   Specialty: Gynecologic Oncology Why: at 1:30pm at the [facility] for post-op check. Contact information: [place] [address] [facility] Follow up on 11/05/2021.   Specialty: Oncology Why: at 11am for lab at the [facility]. You can leave after the blood work has been obtained and our office will contact you with the results. This is to follow up on your sodium level. Contact information: [place] [id number] [place] [phone]                Greater than thirty minutes were spend for face to face discharge instructions and discharge orders/summary in EPIC.   Signed: Dorothyann Gibbs 11/03/2021, 12:20 PM

## 2021-11-03 NOTE — Discharge Instructions (Addendum)
11/03/2021  Return to work: 4-6 weeks if applicable  You will have a white honeycomb dressing over your larger incision. This dressing can be removed 5 days after surgery and you do not need to reapply a new dressing. Once you remove the dressing, you will notice that you have the surgical glue (dermabond) on the incision and this will peel off on its own. You can get this dressing wet in the shower the days after surgery prior to removal on the 5th day.   Plan to come to the [facility] this Friday for repeat metabolic panel (labs) to check your sodium level that was slightly low the day after surgery. We will contact you with the results.   You can resume your Eliquis starting tomorrow, November 04, 2021.  Activity: 1. Be up and out of the bed during the day.  Take a nap if needed.  You may walk up steps but be careful and use the hand rail.  Stair climbing will tire you more than you think, you may need to stop part way and rest.   2. No lifting or straining over 10 lbs, pushing, pulling, straining for 6 weeks.  3. No driving for 1 week(s) if you were cleared to drive before.  Do not drive if you are taking narcotic pain medicine. You need to make sure your reaction time has returned and you can brake safely.  4. Shower daily.  Use your regular soap to bathe and when finished pat your incision dry; don't rub.  No tub baths until cleared by your surgeon.   5. No sexual activity and nothing in the vagina for 8 weeks.  6. You may experience a small amount of clear drainage from your incisions, which is normal.  If the drainage persists or increases, please call the office.  7. You may experience vaginal spotting after surgery or around the 6-8 week mark from surgery when the stitches at the top of the vagina begin to dissolve.  The spotting is normal but if you experience heavy bleeding, call our office.  8. Take Tylenol or ibuprofen first for pain and only use narcotic pain medication for  severe pain not relieved by the Tylenol or Ibuprofen.  Monitor your Tylenol intake to a max of 4,000 mg.   Diet: 1. Low sodium Heart Healthy Diet is recommended.  2. It is safe to use a laxative, such as Miralax or Colace, if you have difficulty moving your bowels. You can take Sennakot at bedtime every evening to keep bowel movements regular and to prevent constipation.    Wound Care: 1. Keep clean and dry.  Shower daily.  Reasons to call the Doctor: Fever - Oral temperature greater than 100.4 degrees Fahrenheit Foul-smelling vaginal discharge Difficulty urinating Nausea and vomiting Increased pain at the site of the incision that is unrelieved with pain medicine. Difficulty breathing with or without chest pain New calf pain especially if only on one side Sudden, continuing increased vaginal bleeding with or without clots.   Contacts: For questions or concerns you should contact:  Dr. [name] at [phone]  Joylene John, NP at [phone]  After Hours: call [phone] and have the GYN Oncologist paged/contacted

## 2021-11-03 NOTE — Plan of Care (Signed)
  Problem: Education: Goal: Knowledge of General Education information will improve Description: Including pain rating scale, medication(s)/side effects and non-pharmacologic comfort measures Outcome: Progressing   Problem: Health Behavior/Discharge Planning: Goal: Ability to manage health-related needs will improve Outcome: Progressing   Problem: Clinical Measurements: Goal: Ability to maintain clinical measurements within normal limits will improve Outcome: Progressing Goal: Will remain free from infection Outcome: Progressing Goal: Diagnostic test results will improve Outcome: Progressing Goal: Respiratory complications will improve Outcome: Progressing Goal: Cardiovascular complication will be avoided Outcome: Progressing   Problem: Activity: Goal: Risk for activity intolerance will decrease Outcome: Progressing   Problem: Nutrition: Goal: Adequate nutrition will be maintained Outcome: Progressing   Problem: Coping: Goal: Level of anxiety will decrease Outcome: Progressing   Problem: Elimination: Goal: Will not experience complications related to bowel motility Outcome: Progressing Goal: Will not experience complications related to urinary retention Outcome: Progressing   Problem: Pain Managment: Goal: General experience of comfort will improve Outcome: Progressing   Problem: Safety: Goal: Ability to remain free from injury will improve Outcome: Progressing   Problem: Skin Integrity: Goal: Risk for impaired skin integrity will decrease Outcome: Progressing   Problem: Education: Goal: Knowledge of the prescribed therapeutic regimen will improve Outcome: Progressing Goal: Understanding of sexual limitations or changes related to disease process or condition will improve Outcome: Progressing Goal: Individualized Educational Video(s) Outcome: Progressing   Problem: Self-Concept: Goal: Communication of feelings regarding changes in body function or  appearance will improve Outcome: Progressing

## 2021-11-05 ENCOUNTER — Other Ambulatory Visit: Payer: Self-pay

## 2021-11-05 ENCOUNTER — Telehealth: Payer: Self-pay

## 2021-11-05 ENCOUNTER — Inpatient Hospital Stay: Payer: Medicare Other | Attending: Gynecologic Oncology

## 2021-11-05 DIAGNOSIS — R1900 Intra-abdominal and pelvic swelling, mass and lump, unspecified site: Secondary | ICD-10-CM | POA: Diagnosis not present

## 2021-11-05 DIAGNOSIS — E871 Hypo-osmolality and hyponatremia: Secondary | ICD-10-CM

## 2021-11-05 LAB — CYTOLOGY - NON PAP

## 2021-11-05 LAB — BASIC METABOLIC PANEL - CANCER CENTER ONLY
Anion gap: 8 (ref 5–15)
BUN: 15 mg/dL (ref 8–23)
CO2: 30 mmol/L (ref 22–32)
Calcium: 10.3 mg/dL (ref 8.9–10.3)
Chloride: 99 mmol/L (ref 98–111)
Creatinine: 0.93 mg/dL (ref 0.44–1.00)
GFR, Estimated: 56 mL/min — ABNORMAL LOW (ref >60)
Glucose, Bld: 106 mg/dL — ABNORMAL HIGH (ref 70–99)
Potassium: 3.8 mmol/L (ref 3.5–5.1)
Sodium: 137 mmol/L (ref 135–145)

## 2021-11-05 LAB — SURGICAL PATHOLOGY

## 2021-11-05 NOTE — Telephone Encounter (Signed)
Spoke with Ms.Rayford this morning. She states she is eating, drinking and urinating well. She has had some urinary incontinence since foley catheter removed before discharge, but improving. None this am.  Pt not experiencing any urinary burning.  She will call Monday if she has any symptoms of UTI to come in and give a urine specimen.  She is taking senokot as prescribed and encouraged her to drink plenty of water. She denies fever or chills. Incisions are dry and intact. Instructed her to remove the honeycomb dressing Monday 11-08-21.  She does not need to reapply and dressing. Leave incision open to the air.  Her pain is controlled with tylenol and ibuprofen.  Instructed to call office with any fever, chills, purulent drainage, uncontrolled pain or any other questions or concerns. Patient verbalizes understanding.  Pt aware of post op appointments as well as the office number [phone] and after hours number [phone] to call if she has any questions or concerns

## 2021-11-05 NOTE — Telephone Encounter (Deleted)
Spoke with Langley Gauss in the lab and added HPV testing.

## 2021-11-05 NOTE — Telephone Encounter (Signed)
Told Mr. Caldwell that his mother's sodium level is much better at 137 today per Dr. Berline Lopes. Mr. Demary verbalized understanding.

## 2021-11-05 NOTE — Telephone Encounter (Signed)
Chart opened in error. This documentation does not belong in Leah Holland's Chart.

## 2021-11-09 ENCOUNTER — Telehealth: Payer: Self-pay | Admitting: Gynecologic Oncology

## 2021-11-09 NOTE — Telephone Encounter (Signed)
Called patient's son to discuss pathology from surgery. No answer. Left voicemail with callback requested.  [name] MD Gynecologic Oncology

## 2021-11-11 ENCOUNTER — Telehealth: Payer: Self-pay | Admitting: Gynecologic Oncology

## 2021-11-11 NOTE — Telephone Encounter (Signed)
Called patient's son, Patrick Jupiter, again. No answer. Voicemail left requesting callback to office to discuss pathology from surgery.  [name] MD Gynecologic Oncology

## 2021-11-15 ENCOUNTER — Other Ambulatory Visit: Payer: Self-pay | Admitting: Oncology

## 2021-11-15 NOTE — Progress Notes (Signed)
Gynecologic Oncology Multi-Disciplinary Disposition Conference Note  Date of the Conference: 11/15/2021  Patient Name: Leah Holland  Referring Provider: Dr. [name] Primary GYN Oncologist: Dr. 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 Disposition is to systemic therapy but patient is requesting surveillance.   This Multidisciplinary conference took place involving physicians from [facility], [facility], Radiation Oncology, Pathology, Radiology along with the Gynecologic Oncology Nurse Practitioner and RN.  Comprehensive assessment of the patient's malignancy, staging, need for surgery, chemotherapy, radiation therapy, and need for further testing were reviewed. Supportive measures, both inpatient and following discharge were also discussed. The recommended plan of care is documented. Greater than 35 minutes were spent correlating and coordinating this patient's care.

## 2021-11-16 ENCOUNTER — Telehealth: Payer: Self-pay | Admitting: Gynecologic Oncology

## 2021-11-16 ENCOUNTER — Telehealth: Payer: Self-pay | Admitting: *Deleted

## 2021-11-16 NOTE — Telephone Encounter (Signed)
Patient's son called and left a message that he was returning Dr Charisse March call regarding his mother's pathology report

## 2021-11-16 NOTE — Telephone Encounter (Signed)
Returned patient's son's call. Discussed pathology from surgery in detail and what typical recommendations would be. His mother has already voiced not being interested in adjuvant therapy. We will plan to discuss further at her follow-up visit next week.  [name] MD Gynecologic Oncology

## 2021-11-22 ENCOUNTER — Other Ambulatory Visit: Payer: Self-pay

## 2021-11-22 ENCOUNTER — Encounter: Payer: Self-pay | Admitting: Gynecologic Oncology

## 2021-11-22 ENCOUNTER — Inpatient Hospital Stay: Payer: Medicare Other | Attending: Gynecologic Oncology | Admitting: Gynecologic Oncology

## 2021-11-22 VITALS — BP 152/81 | HR 71 | Temp 97.6°F | Resp 16 | Ht 62.0 in | Wt 121.8 lb

## 2021-11-22 DIAGNOSIS — Z90722 Acquired absence of ovaries, bilateral: Secondary | ICD-10-CM

## 2021-11-22 DIAGNOSIS — Z90710 Acquired absence of both cervix and uterus: Secondary | ICD-10-CM

## 2021-11-22 DIAGNOSIS — R1900 Intra-abdominal and pelvic swelling, mass and lump, unspecified site: Secondary | ICD-10-CM

## 2021-11-22 DIAGNOSIS — C541 Malignant neoplasm of endometrium: Secondary | ICD-10-CM

## 2021-11-22 DIAGNOSIS — Z7189 Other specified counseling: Secondary | ICD-10-CM

## 2021-11-22 NOTE — Progress Notes (Signed)
Gynecologic Oncology Return Clinic Visit  11/22/21  Reason for Visit: Follow-up after surgery, treatment discussion  Treatment History: 06/2021: Patient seen initially for urinary incontinence. 07/01/2021: CT scan. 09/09/2021: Pelvic ultrasound shows enlarged uterus with internal mass and cystic/solid components.  Bilateral ovaries not identified, no adnexal masses appreciated. 09/24/2021: MRI A/P shows thick-walled cystic lesion in the midline pelvis, which appears to arise from the right ovary, measures 8.5 x 7.5 x 8.1 cm.  Findings highly concerning for cystic ovarian neoplasm.  No adenopathy or metastatic disease definitively noted. 11/02/2021: Robotic total hysterectomy with BSO, mini lap for specimen removal, cystoscopy.  Findings at surgery included enlarged uterus with pyometra, atrophic adnexa, retroperitoneal fibrosis and edema, significant diverticular disease, powder burn lesions on the uterus and pelvic peritoneum consistent with possible endometriosis. 11/02/2021: Final pathology reveals poorly differentiated carcinoma with clear cell features of the uterus, extends to subserosal myometrium.  Depth of invasion 96 5%, LVI not identified.  Cervix negative for carcinoma.  Adnexa negative for carcinoma.  Interval History: In today for follow-up after surgery.  Reports overall doing well.  Denies any vaginal bleeding or discharge.  Endorses normal bowel function.  Continues to have urinary incontinence, similar to symptoms before surgery and what caused her initial work-up to happen over the summer.  Appetite has been okay but notes still having some weight loss.  Has been working with physical therapy at her assisted living facility.  Past Medical/Surgical History: Past Medical History:  Diagnosis Date   Anxiety attack    "take RX prn" (08/13/2013)   Arthritis    "probably" (08/13/2013)   Atrial fibrillation ([place])    new onset/pt report 08/13/2013   Bladder wall thickening 07/01/2021   CT  scan in Epic   Bronchitis 12/2011   Cholelithiasis 07/01/2021   CT in Epic.   Common bile duct dilation 07/01/2021   CT in Epic:  13 mm.   Complication of anesthesia    Compression fracture of first lumbar vertebra ([place]) 07/01/2021   CT in Epic.   Depression    Femur fracture (H[facility])    left proximal femur non-union   GERD (gastroesophageal reflux disease)    H/O hiatal hernia    Hardware complicating wound infection ([place]) 08/22/2016   History of blood transfusion    "w/both knee OR's" (08/13/2013)   Hyperlipidemia    Hyperlipidemia    Hypertension    Hypertensive urgency 10/22/2018   Hypothyroidism    takes synthroid   Insomnia    Intertrochanteric fracture of right hip ([place]) 07/30/2015   Neuromuscular disorder (HCC)    carpal tunnel in right hand   Periprosthetic fracture around internal prosthetic right hip joint ([place]), nonunion basicervical femoral neck with broken hardware and subtrochanteric fracture 03/16/2016   PONV (postoperative nausea and vomiting)    Prosthetic hip infection ([place]) 08/22/2016   Proteus infection 08/22/2016   Serratia infection 11/14/2016   Shingles    Skin cancer of face 2000's   "one on each side of my face and one on my nose" (08/13/2013)   Swelling of extremity    sees Dr. Gwenlyn Found, cardiologist 1 x year [phone]   Tendon dysfunction 08/22/2016   Wears hearing aid    [facility]    Past Surgical History:  Procedure Laterality Date   2D ECHOCARDIOGRAM  12/07/2004   EF 48%, moderate pulmonary hypertension   BREAST BIOPSY Left ?1970's   CARDIOVASCULAR STRESS TEST  12/07/11   CARDIOVASCULAR STRESS TEST  12/07/2011   LEXISCAN, normal scan, no  significant wall abnormalities noted   CARPAL TUNNEL RELEASE Right ~ 2005   CATARACT EXTRACTION, BILATERAL Bilateral 2000   CYSTOSCOPY  11/02/2021   Procedure: CYSTOSCOPY;  Surgeon: Lafonda Mosses, MD;  Location: [facility];  Service: Gynecology;;   DILATION AND CURETTAGE OF UTERUS  1950's   DOPPLER  ECHOCARDIOGRAPHY  12/07/11   EYE SURGERY     FEMUR IM NAIL Right 08/01/2015   Procedure: INTRAMEDULLARY (IM) NAIL FEMORAL;  Surgeon: Marchia Bond, MD;  Location: [facility];  Service: Orthopedics;  Laterality: Right;   HARDWARE REMOVAL Right 03/16/2016   Procedure: HARDWARE REMOVAL;  Surgeon: Marchia Bond, MD;  Location: [facility];  Service: Orthopedics;  Laterality: Right;   INTRAMEDULLARY (IM) NAIL INTERTROCHANTERIC Left 01/26/2017   Procedure: INTRAMEDULLARY (IM) NAIL INTERTROCHANTRIC;  Surgeon: Leandrew Koyanagi, MD;  Location: [facility];  Service: Orthopedics;  Laterality: Left;   JOINT REPLACEMENT     bilateral knee replacement   ROBOTIC ASSISTED BILATERAL SALPINGO OOPHERECTOMY Bilateral 11/02/2021   Procedure: XI ROBOTIC ASSISTED BILATERAL SALPINGO OOPHORECTOMY;  Surgeon: Lafonda Mosses, MD;  Location: [facility];  Service: Gynecology;  Laterality: Bilateral;   ROBOTIC ASSISTED TOTAL HYSTERECTOMY N/A 11/02/2021   Procedure: XI ROBOTIC ASSISTED TOTAL HYSTERECTOMY WITH  MINI LAPAROTOMY;  Surgeon: Lafonda Mosses, MD;  Location: [facility];  Service: Gynecology;  Laterality: N/A;   SKIN CANCER EXCISION  2000's   "off my nose" (08/13/2013)   TOTAL HIP ARTHROPLASTY Right 03/16/2016   Procedure: TOTAL HIP ARTHROPLASTY;  Surgeon: Marchia Bond, MD;  Location: [facility];  Service: Orthopedics;  Laterality: Right;   TOTAL HIP ARTHROPLASTY Left 06/09/2017   Procedure: REMOVE LEFT HIP INTRAMEDULLARY NAIL AND CONVERT TO LEFT CEMENTED HEMI HIP ARTHROPLASTY;  Surgeon: Leandrew Koyanagi, MD;  Location: [facility];  Service: Orthopedics;  Laterality: Left;   TOTAL KNEE ARTHROPLASTY Right 2005   TOTAL KNEE ARTHROPLASTY  03/02/2012   Procedure: TOTAL KNEE ARTHROPLASTY;  Surgeon: Yvette Rack., MD;  Location: [facility];  Service: Orthopedics;  Laterality: Left;    Family History  Problem Relation Age of Onset   Arrhythmia Mother    Heart disease Mother    Stroke Father 18   Arrhythmia Sister    Heart disease Sister    Heart disease  Sister    Stroke Sister    Diabetes Sister    Heart disease Brother    Heart disease Brother    Cancer Brother        Colon cancer   Arrhythmia Brother    Heart disease Brother    Heart disease Child 15   Heart disease Child 80   Breast cancer Neg Hx    Ovarian cancer Neg Hx    Endometrial cancer Neg Hx    Pancreatic cancer Neg Hx    Prostate cancer Neg Hx     Social History   Socioeconomic History   Marital status: Widowed    Spouse name: Not on file   Number of children: Not on file   Years of education: Not on file   Highest education level: Not on file  Occupational History   Not on file  Tobacco Use   Smoking status: Never   Smokeless tobacco: Never  Vaping Use   Vaping Use: Never used  Substance and Sexual Activity   Alcohol use: No   Drug use: No   Sexual activity: Not Currently  Other Topics Concern   Not on file  Social History Narrative   Widowed. Lives at  [facility]. Son died in the [place] War and she has not slept well since then.   Admitted to [facility] 01/30/17   Never smoked   Alcohol none   Full Code   Social Determinants of Health   Financial Resource Strain: Not on file  Food Insecurity: Not on file  Transportation Needs: Not on file  Physical Activity: Not on file  Stress: Not on file  Social Connections: Not on file    Current Medications:  Current Outpatient Medications:    acetaminophen (TYLENOL) 325 MG tablet, Take 650 mg by mouth every 6 (six) hours as needed for moderate pain. , Disp: , Rfl:    chlorthalidone (HYGROTON) 25 MG tablet, TAKE 1 TABLET ONCE DAILY., Disp: 90 tablet, Rfl: 3   Cholecalciferol 2000 units CAPS, Take 2,000 Units by mouth every evening., Disp: , Rfl:    denosumab (PROLIA) 60 MG/ML SOLN injection, Inject 60 mg into the skin every 6 (six) months. Administer in upper arm, thigh, or abdomen, Disp: , Rfl:    diltiazem (CARDIZEM CD) 180 MG 24 hr capsule, TAKE 1 CAPSULE DAILY., Disp: 90 capsule,  Rfl: 2   ELIQUIS 2.5 MG TABS tablet, TAKE 1 TABLET BY MOUTH TWICE DAILY., Disp: 180 tablet, Rfl: 1   Flaxseed, Linseed, (FLAXSEED OIL) 1000 MG CAPS, Take 1,000 mg by mouth 2 (two) times daily. , Disp: , Rfl:    furosemide (LASIX) 20 MG tablet, Take 20 mg by mouth daily as needed for fluid or edema., Disp: , Rfl:    levothyroxine (SYNTHROID) 112 MCG tablet, Take 112 mcg by mouth daily before breakfast. , Disp: , Rfl:    magnesium hydroxide (MILK OF MAGNESIA) 400 MG/5ML suspension, Take 30 mLs by mouth daily as needed for mild constipation., Disp: , Rfl:    metoprolol (LOPRESSOR) 50 MG tablet, Take 50 mg by mouth 2 (two) times daily., Disp: , Rfl:    Multiple Vitamins-Minerals (PRESERVISION AREDS 2+MULTI VIT PO), Take 1 capsule by mouth 2 (two) times daily., Disp: , Rfl:    potassium chloride SA (KLOR-CON) 20 MEQ tablet, Take 20 mEq by mouth daily as needed (Take with each furosemide dose)., Disp: , Rfl:    senna (SENOKOT) 8.6 MG tablet, Take 2 tablets by mouth at bedtime., Disp: , Rfl:    sulfamethoxazole-trimethoprim (BACTRIM) 400-80 MG tablet, Take 1 tablet by mouth 2 (two) times daily., Disp: 60 tablet, Rfl: 11   temazepam (RESTORIL) 30 MG capsule, Take 1 capsule (30 mg total) by mouth at bedtime as needed for sleep. (Patient taking differently: Take 30 mg by mouth at bedtime.), Disp: 30 capsule, Rfl: 0   traMADol (ULTRAM) 50 MG tablet, Take 1 tablet (50 mg total) by mouth every 6 (six) hours as needed for severe pain. Do not take and drive (Patient not taking: Reported on 11/22/2021), Disp: 10 tablet, Rfl: 0  Review of Systems: Pertinent positives include urinary frequency, incontinence. Denies appetite changes, fevers, chills, fatigue, unexplained weight changes. Denies hearing loss, neck lumps or masses, mouth sores, ringing in ears or voice changes. Denies cough or wheezing.  Denies shortness of breath. Denies chest pain or palpitations. Denies leg swelling. Denies abdominal distention,  pain, blood in stools, constipation, diarrhea, nausea, vomiting, or early satiety. Denies pain with intercourse, dysuria, hematuria. Denies hot flashes, pelvic pain, vaginal bleeding or vaginal discharge.   Denies joint pain, back pain or muscle pain/cramps. Denies itching, rash, or wounds. Denies dizziness, headaches, numbness or seizures. Denies swollen lymph nodes or  glands, denies easy bruising or bleeding. Denies anxiety, depression, confusion, or decreased concentration.  Physical Exam: BP (!) 152/81 (BP Location: Right Arm, Patient Position: Sitting)   Pulse 71   Temp 97.6 F (36.4 C) (Oral)   Resp 16   Ht 5\' 2"  (1.575 m)   Wt 121 lb 12.8 oz (55.2 kg)   SpO2 97%   BMI 22.28 kg/m  General: Alert, oriented, no acute distress. HEENT: Normocephalic, atraumatic, sclera anicteric. Chest: Unlabored breathing on room air. Abdomen: soft, nontender.  Normoactive bowel sounds.  No masses or hepatosplenomegaly appreciated.  Well-healed incisions. Extremities: Grossly normal range of motion.  Warm, well perfused.  No edema bilaterally. GU: Normal appearing external genitalia without erythema, excoriation, or lesions.  Speculum exam reveals moderately atrophic vaginal mucosa, narrow vaginal vault, tissue somewhat bloodstained but no active bleeding or discharge noted on exam.  Cuff intact, suture visible.  On bimanual exam, cuff intact.  Laboratory & Radiologic Studies: 11/222: Pathology A. UTERUS, CERVIX, BILATERAL FALLOPIAN TUBES AND OVARIES:  - Endometrium      Poorly differentiated carcinoma with clear cell features.      Carcinoma extends into subserosal myometrium.      See oncology table and comment.  - Right ovary      Calcified nodule consistent with calcified ovarian fibroma.      No endometriosis or malignancy.  - Right Fallopian tube      Adhesions.      No endometriosis or malignancy.  - Left Fallopian tube      Lesions and focal luminal granulation tissue.      No  endometriosis or malignancy.   ONCOLOGY TABLE:   UTERUS, CARCINOMA OR CARCINOSARCOMA: Resection  Procedure: Hysterectomy with bilateral salpingo-oophorectomy  Histologic Type: Poorly differentiated carcinoma with clear cell  features, see comment.  Histologic Grade: High-grade, FIGO 3.  Myometrial Invasion:       Depth of Myometrial Invasion (mm): 15 mm       Myometrial Thickness (mm): 16 mm       Percentage of Myometrial Invasion: 95%  Uterine Serosa Involvement: Not identified.  Cervical stromal Involvement: Not identified.  Extent of involvement of other tissue/organs: Not identified.  Peritoneal/Ascitic Fluid: Pending  Lymphovascular Invasion: Not identified.  Regional Lymph Nodes: No lymph nodes submitted.  Pathologic Stage Classification (pTNM, AJCC 8th Edition): pT1b, pN not  assigned, see comment.  Ancillary Studies: Can be performed if requested.  Representative Tumor Block: A3-A5  Comment(s): There is a 5 x 4 x 2 cm carcinoma which extends from the  endometrium to the subserosal myometrium.  The carcinoma has greater  than 90% clear cell features with several microscopic foci of poorly  differentiated carcinoma, focally with squamous features.  The findings  overall are felt to represent a clear cell endometrial carcinoma with  the microscopic squamous component possibly representing an endometrioid  component with squamous features.  This case is staged as an endometrial  primary; however, secondary involvement by a non-uterine carcinoma  cannot be ruled out but is considered unlikely.  The nonneoplastic  endometrium shows extensive granulation tissue with cholesterol clefts  and histiocytic infiltrates.   Assessment & Plan: Leah Holland is a 87 y.o. woman with incompletely staged deeply myoinvasive uterine high-grade carcinoma with clear cell features presenting for follow-up.  Patient is overall doing well from a postoperative standpoint meeting milestones.  We  discussed continued expectations as well as postoperative restrictions.  She would like to do some exercises for her urinary symptoms  with her PT at the living facility. As long as she is within post-op restrictions, she can start this any time.  Reviewed pathology in detail with the patient and her two sons present.  While the cancer was confined to the uterus (no nodes sampled), given high risk features, I suspect that she already has metastatic disease. Discussed typical treatment plan, which would include adjuvant treatment (even for high-grade uterine carcinoma with negative lymph node sampling - which she did not have - given her age and deeply myoinvasive tumor).  Discussed survival statistics for high-grade cancers with adjuvant treatment.  The patient had previously voiced to me not wanting adjuvant treatment. This discussion is what prompted our surgical plan.   We discussed hospice today.  She continues to be fairly independent and is at a assisted living where she could have more assistance when needed. She also believes that hospice is able to come in to provide service for residents there. She would like to wait on referral to hospital at this point.  Recommended follow-up in 3 months with me in person.  This can always change to virtual visit. I discussed signs and symptoms that could occur with cancer progression and metastasis.  38 minutes of total time was spent for this patient encounter, including preparation, face-to-face counseling with the patient and coordination of care, and documentation of the encounter.  [name], MD  Division of Gynecologic Oncology  Department of Obstetrics and Gynecology  [facility]

## 2021-11-22 NOTE — Patient Instructions (Addendum)
It was good to see you today. You are healing well from surgery!  I will see you for follow-up in 3 months.

## 2021-12-17 DIAGNOSIS — N3946 Mixed incontinence: Secondary | ICD-10-CM | POA: Diagnosis not present

## 2021-12-17 DIAGNOSIS — M6281 Muscle weakness (generalized): Secondary | ICD-10-CM | POA: Diagnosis not present

## 2021-12-20 DIAGNOSIS — N3946 Mixed incontinence: Secondary | ICD-10-CM | POA: Diagnosis not present

## 2021-12-20 DIAGNOSIS — M6281 Muscle weakness (generalized): Secondary | ICD-10-CM | POA: Diagnosis not present

## 2021-12-22 DIAGNOSIS — N3946 Mixed incontinence: Secondary | ICD-10-CM | POA: Diagnosis not present

## 2021-12-22 DIAGNOSIS — M6281 Muscle weakness (generalized): Secondary | ICD-10-CM | POA: Diagnosis not present

## 2021-12-24 DIAGNOSIS — M6281 Muscle weakness (generalized): Secondary | ICD-10-CM | POA: Diagnosis not present

## 2021-12-24 DIAGNOSIS — N3946 Mixed incontinence: Secondary | ICD-10-CM | POA: Diagnosis not present

## 2021-12-27 DIAGNOSIS — N3946 Mixed incontinence: Secondary | ICD-10-CM | POA: Diagnosis not present

## 2021-12-27 DIAGNOSIS — M6281 Muscle weakness (generalized): Secondary | ICD-10-CM | POA: Diagnosis not present

## 2021-12-31 DIAGNOSIS — M6281 Muscle weakness (generalized): Secondary | ICD-10-CM | POA: Diagnosis not present

## 2021-12-31 DIAGNOSIS — N3946 Mixed incontinence: Secondary | ICD-10-CM | POA: Diagnosis not present

## 2022-01-03 DIAGNOSIS — M6281 Muscle weakness (generalized): Secondary | ICD-10-CM | POA: Diagnosis not present

## 2022-01-03 DIAGNOSIS — N3946 Mixed incontinence: Secondary | ICD-10-CM | POA: Diagnosis not present

## 2022-01-05 DIAGNOSIS — N3946 Mixed incontinence: Secondary | ICD-10-CM | POA: Diagnosis not present

## 2022-01-05 DIAGNOSIS — M6281 Muscle weakness (generalized): Secondary | ICD-10-CM | POA: Diagnosis not present

## 2022-01-07 DIAGNOSIS — N3946 Mixed incontinence: Secondary | ICD-10-CM | POA: Diagnosis not present

## 2022-01-07 DIAGNOSIS — M6281 Muscle weakness (generalized): Secondary | ICD-10-CM | POA: Diagnosis not present

## 2022-01-10 DIAGNOSIS — M6281 Muscle weakness (generalized): Secondary | ICD-10-CM | POA: Diagnosis not present

## 2022-01-10 DIAGNOSIS — N3946 Mixed incontinence: Secondary | ICD-10-CM | POA: Diagnosis not present

## 2022-01-12 DIAGNOSIS — N3946 Mixed incontinence: Secondary | ICD-10-CM | POA: Diagnosis not present

## 2022-01-12 DIAGNOSIS — M6281 Muscle weakness (generalized): Secondary | ICD-10-CM | POA: Diagnosis not present

## 2022-01-26 ENCOUNTER — Other Ambulatory Visit: Payer: Self-pay | Admitting: Cardiovascular Disease

## 2022-01-26 NOTE — Telephone Encounter (Signed)
Prescription refill request for Eliquis received. Indication:Afib Last office visit:11/22 Scr:0.9 Age: 88 Weight:55.2 kg  Prescription refilled

## 2022-01-31 DIAGNOSIS — I4891 Unspecified atrial fibrillation: Secondary | ICD-10-CM | POA: Diagnosis not present

## 2022-01-31 DIAGNOSIS — E039 Hypothyroidism, unspecified: Secondary | ICD-10-CM | POA: Diagnosis not present

## 2022-01-31 DIAGNOSIS — M810 Age-related osteoporosis without current pathological fracture: Secondary | ICD-10-CM | POA: Diagnosis not present

## 2022-01-31 DIAGNOSIS — R739 Hyperglycemia, unspecified: Secondary | ICD-10-CM | POA: Diagnosis not present

## 2022-01-31 DIAGNOSIS — E785 Hyperlipidemia, unspecified: Secondary | ICD-10-CM | POA: Diagnosis not present

## 2022-01-31 DIAGNOSIS — R1900 Intra-abdominal and pelvic swelling, mass and lump, unspecified site: Secondary | ICD-10-CM | POA: Diagnosis not present

## 2022-01-31 DIAGNOSIS — G4700 Insomnia, unspecified: Secondary | ICD-10-CM | POA: Diagnosis not present

## 2022-01-31 DIAGNOSIS — C541 Malignant neoplasm of endometrium: Secondary | ICD-10-CM | POA: Diagnosis not present

## 2022-01-31 DIAGNOSIS — I10 Essential (primary) hypertension: Secondary | ICD-10-CM | POA: Diagnosis not present

## 2022-02-03 ENCOUNTER — Telehealth: Payer: Self-pay

## 2022-02-03 NOTE — Telephone Encounter (Signed)
Attempted to contact patient's son Patrick Jupiter to schedule a Palliative Care consult appointment. No answer left a message to return call.

## 2022-02-07 ENCOUNTER — Telehealth: Payer: Self-pay

## 2022-02-07 NOTE — Telephone Encounter (Signed)
Attempted to contact patient/patient's son Leah Holland to schedule a Palliative Care consult appointment. No answer left a message to return call on both mobile and home number.

## 2022-02-09 IMAGING — CT CT HEAD W/O CM
1 series · 15 of 30 positions shown, 19 images · non-contrast
Comparison: Head CT 01/25/2017

CLINICAL DATA: Evaluate subdural hematoma. Additional history
provided by scanning technologist: Patient felt beginning and end of
last month, did not hit head, dull headaches, did hit head 2 years
ago.

EXAM:
CT HEAD WITHOUT CONTRAST
TECHNIQUE: Contiguous axial images were obtained from the base of the skull
through the vertex without intravenous contrast.

[Series 2: head w/(date) · axial · 0.43mm/px · z∈[+838,+983]mm · 15 of 33 slices shown, 19 images]
[im 2/33  brain]
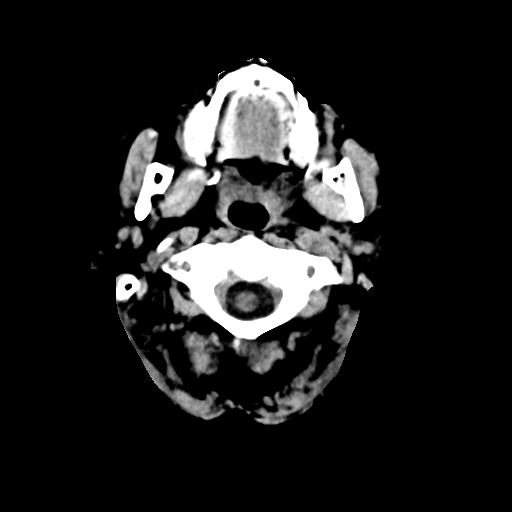
[im 2/33  bone]
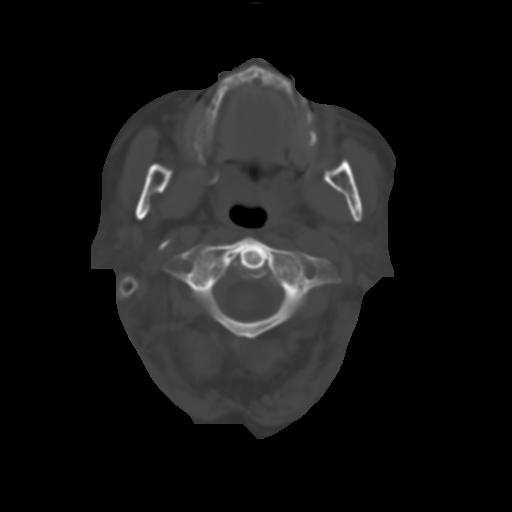
[im 4/33  brain]
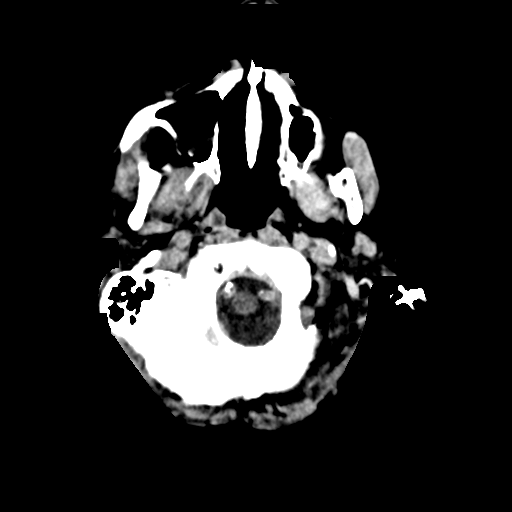
[im 6/33  brain]
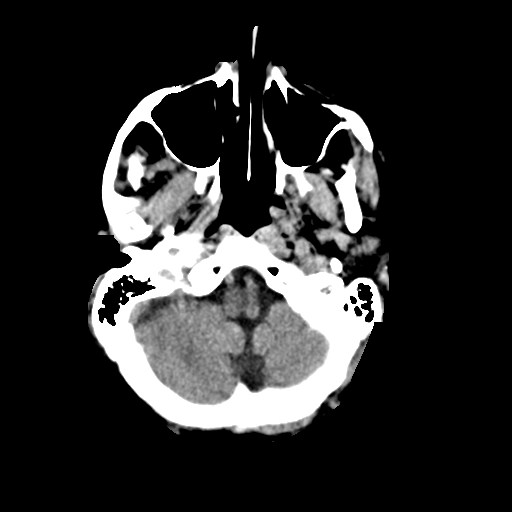
[im 8/33  brain]
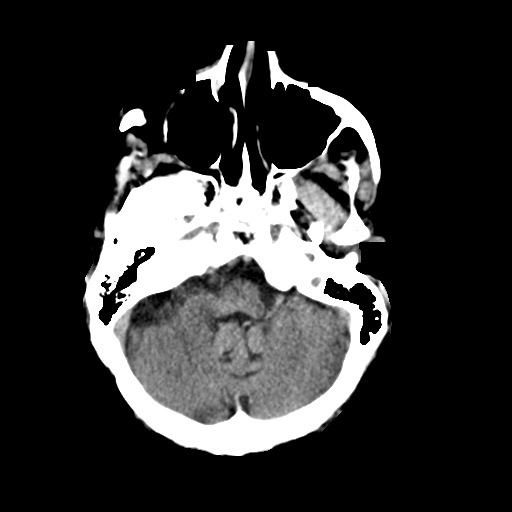
[im 10/33  brain]
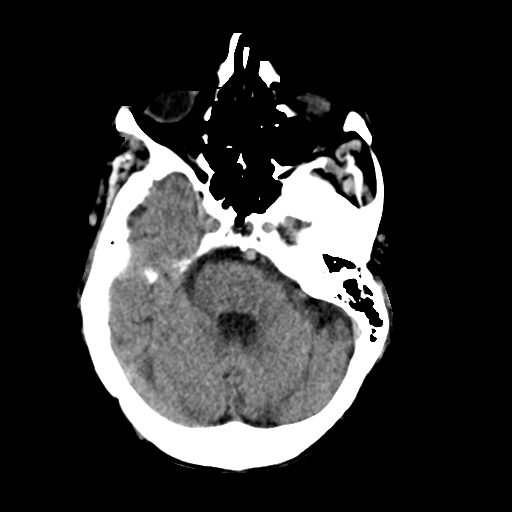
[im 10/33  bone]
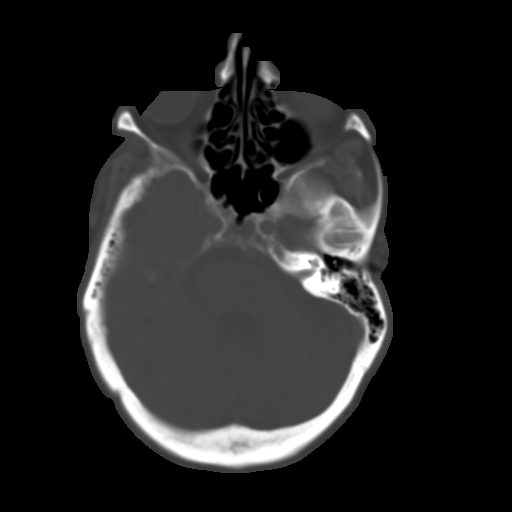
[im 13/33  brain]
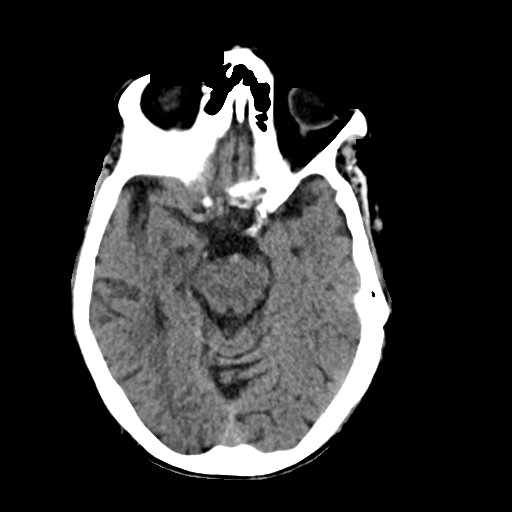
[im 15/33  brain]
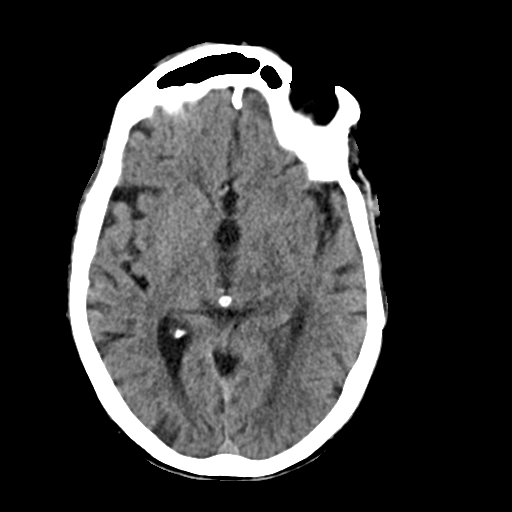
[im 17/33  brain]
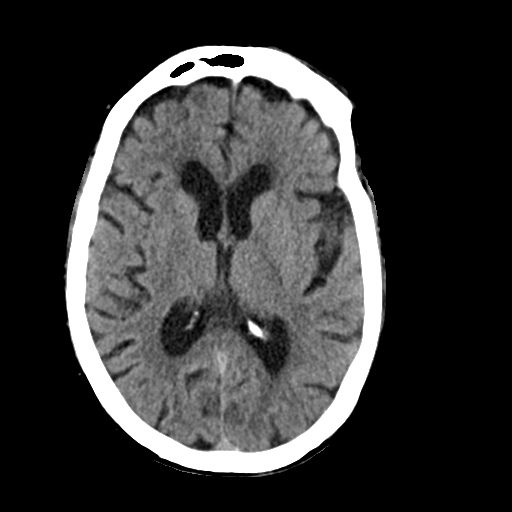
[im 18/33  brain]
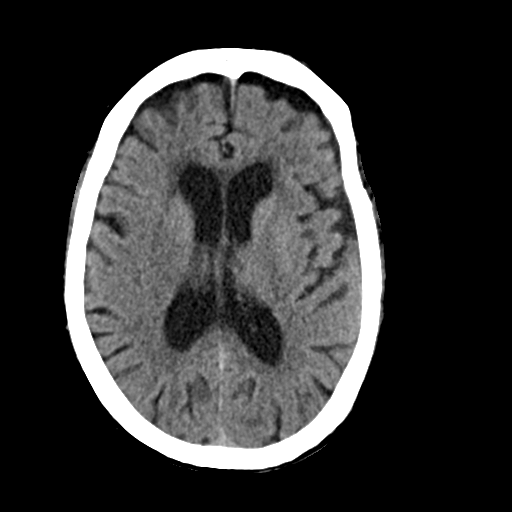
[im 18/33  bone]
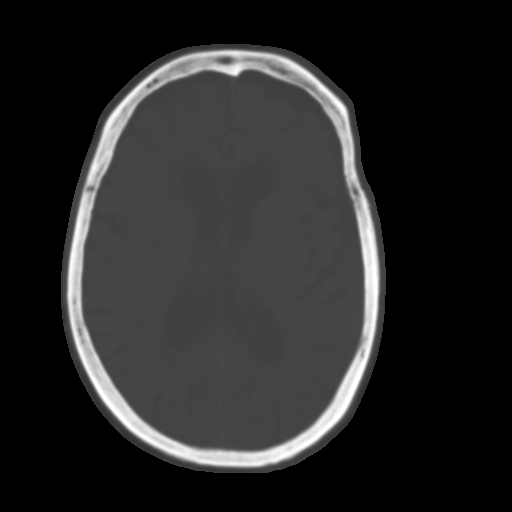
[im 20/33  brain]
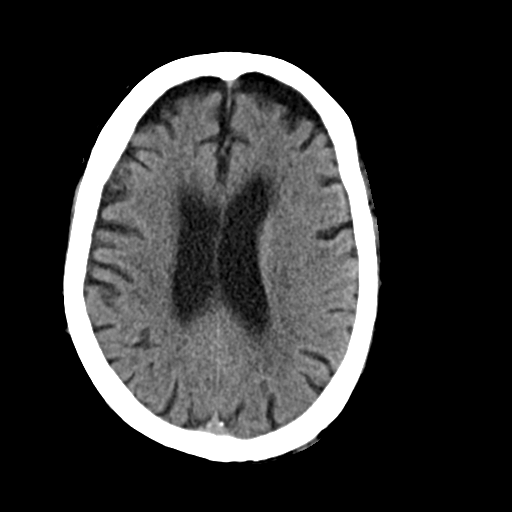
[im 23/33  brain]
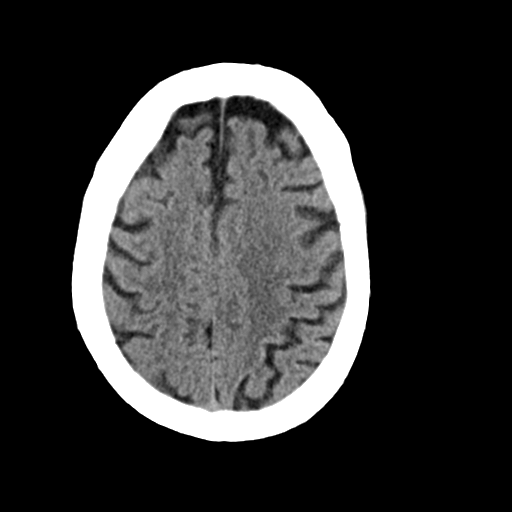
[im 25/33  brain]
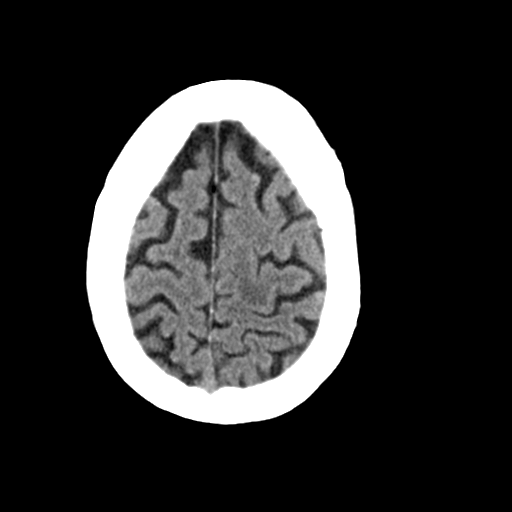
[im 27/33  brain]
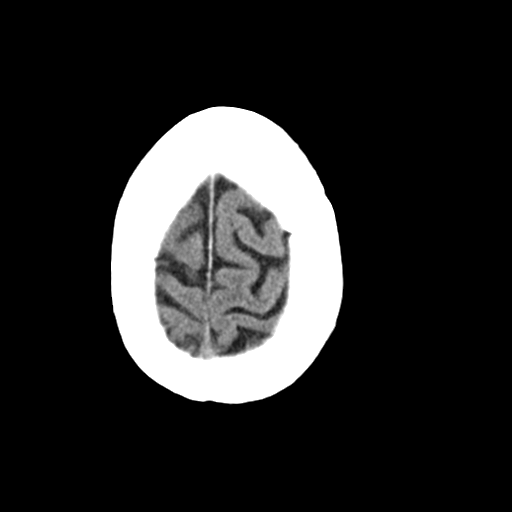
[im 27/33  bone]
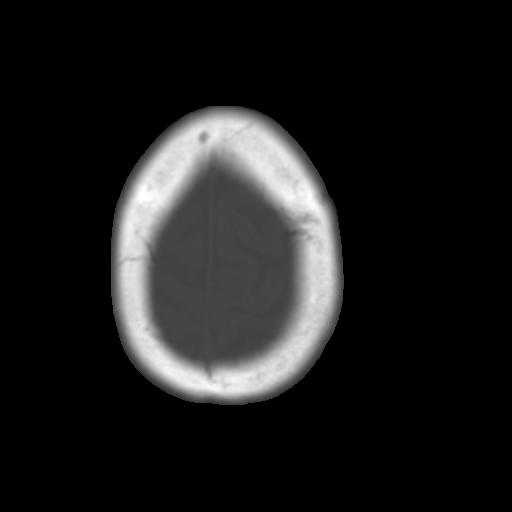
[im 29/33  brain]
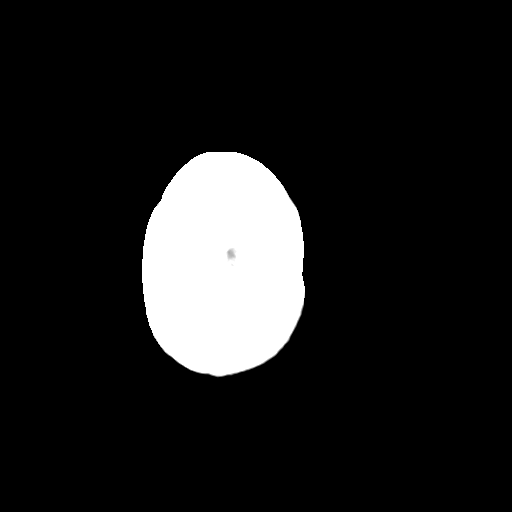
[im 31/33  brain]
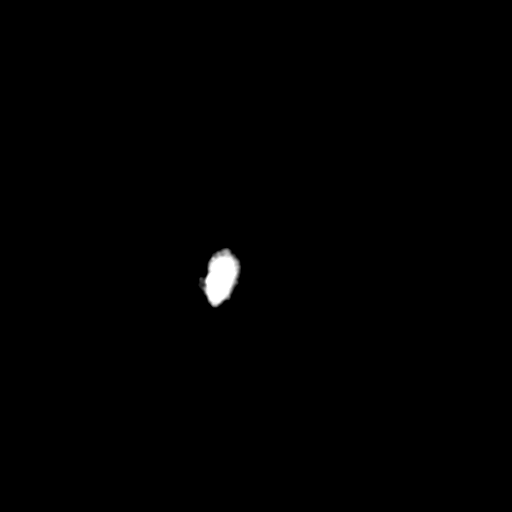

[15 of 30 positions shown; findings below may reference images not displayed]

FINDINGS: Brain:

Stable, mild generalized parenchymal atrophy.

Mild ill-defined hypoattenuation within the cerebral white matter is
nonspecific, but consistent with chronic small vessel ischemic
disease. Findings are similar as compared to prior examination
01/25/2017.

There is no acute intracranial hemorrhage.

No demarcated cortical infarct.

No extra-axial fluid collection.

No evidence of intracranial mass.

No midline shift.

Vascular: No hyperdense vessel.  Atherosclerotic calcifications.

Skull: Normal. Negative for fracture or focal lesion.

Sinuses/Orbits: Visualized orbits show no acute finding. Mild right
maxillary sinus mucosal thickening. No significant mastoid effusion.
IMPRESSION: No evidence of acute intracranial abnormality.

Mild generalized parenchymal atrophy and chronic small vessel
ischemic disease.

Mild right maxillary sinus mucosal thickening.

## 2022-02-10 ENCOUNTER — Telehealth: Payer: Self-pay

## 2022-02-10 NOTE — Telephone Encounter (Signed)
Spoke with patient's son Patrick Jupiter and scheduled a in person Palliative Consult for 02/25/22 @ 9AM. Son requested consult after oncology appointment on 3/13. ? ?Consent obtained; updated Outlook/Netsmart/Team List and Epic.  ? ?

## 2022-02-15 ENCOUNTER — Other Ambulatory Visit: Payer: Self-pay | Admitting: Infectious Disease

## 2022-02-17 ENCOUNTER — Telehealth: Payer: Self-pay | Admitting: *Deleted

## 2022-02-17 NOTE — Telephone Encounter (Signed)
Patient's sone called and stated "I was wondering if mom needed a scan before she sees Dr Berline Lopes on Monday? If so do we need to move her appt? She has had no scans since surgery." Explained that the message would be given to Dr Berline Lopes and the office would call him back  ?

## 2022-02-17 NOTE — Telephone Encounter (Signed)
Per Dr Berline Lopes spoke with the son and explained her recommendations "I would recommend having her visit first. This will give [place] a chance to talk about imaging and why we would or wouldn?t get any at this time." Patient's son verbalized understanding  ?

## 2022-02-18 ENCOUNTER — Ambulatory Visit: Payer: Medicare Other | Admitting: Gynecologic Oncology

## 2022-02-21 ENCOUNTER — Encounter: Payer: Self-pay | Admitting: Gynecologic Oncology

## 2022-02-21 ENCOUNTER — Inpatient Hospital Stay: Payer: Medicare Other | Attending: Gynecologic Oncology | Admitting: Gynecologic Oncology

## 2022-02-21 ENCOUNTER — Other Ambulatory Visit: Payer: Self-pay

## 2022-02-21 VITALS — BP 171/87 | HR 69 | Temp 98.0°F | Resp 14 | Ht 61.81 in | Wt 129.6 lb

## 2022-02-21 DIAGNOSIS — N393 Stress incontinence (female) (male): Secondary | ICD-10-CM | POA: Insufficient documentation

## 2022-02-21 DIAGNOSIS — R1900 Intra-abdominal and pelvic swelling, mass and lump, unspecified site: Secondary | ICD-10-CM

## 2022-02-21 DIAGNOSIS — Z90710 Acquired absence of both cervix and uterus: Secondary | ICD-10-CM | POA: Insufficient documentation

## 2022-02-21 DIAGNOSIS — Z90722 Acquired absence of ovaries, bilateral: Secondary | ICD-10-CM | POA: Insufficient documentation

## 2022-02-21 DIAGNOSIS — N952 Postmenopausal atrophic vaginitis: Secondary | ICD-10-CM | POA: Insufficient documentation

## 2022-02-21 DIAGNOSIS — C55 Malignant neoplasm of uterus, part unspecified: Secondary | ICD-10-CM | POA: Diagnosis not present

## 2022-02-21 NOTE — Patient Instructions (Addendum)
It was good to see you today.  We will plan on a follow-up phone visit in 3 months unless something changes before then.  If so, please call to see me sooner. ? ?I have sent a referral to Dr. Wannetta Sender with Urogynecology to discussed possible pessary use. ?

## 2022-02-21 NOTE — Progress Notes (Signed)
Gynecologic Oncology Return Clinic Visit  02/21/2022  Reason for Visit: Surveillance visit in the setting of high risk uterine cancer  Treatment History: 06/2021: Patient seen initially for urinary incontinence. 07/01/2021: CT scan. 09/09/2021: Pelvic ultrasound shows enlarged uterus with internal mass and cystic/solid components.  Bilateral ovaries not identified, no adnexal masses appreciated. 09/24/2021: MRI A/P shows thick-walled cystic lesion in the midline pelvis, which appears to arise from the right ovary, measures 8.5 x 7.5 x 8.1 cm.  Findings highly concerning for cystic ovarian neoplasm.  No adenopathy or metastatic disease definitively noted. 11/02/2021: Robotic total hysterectomy with BSO, mini lap for specimen removal, cystoscopy.  Findings at surgery included enlarged uterus with pyometra, atrophic adnexa, retroperitoneal fibrosis and edema, significant diverticular disease, powder burn lesions on the uterus and pelvic peritoneum consistent with possible endometriosis. 11/02/2021: Final pathology reveals poorly differentiated carcinoma with clear cell features of the uterus, extends to subserosal myometrium.  Depth of invasion 95%, LVI not identified.  Cervix negative for carcinoma.  Adnexa negative for carcinoma.  Interval History: Patient presents for follow-up today.  She notes overall doing well.  Denies any significant abdominal or pelvic pain.  Reports appetite has been at baseline.  She endorses some mild nausea once or twice a week, denies any emesis.  Nausea passes without intervention.  She has 1 or 2 episodes of lightheadedness a week where she describes feeling "wobbly" when walking.  She is using her walker more frequently now.  She endorses regular bowel function, at baseline.  She thinks her fatigue has worsened some.  Hospice is coming either Friday or next week to establish care.  She continues to be quite bothered by urinary incontinence which she describes as mostly  stress urinary incontinence.  She was getting some help with physical therapy but is not doing this and feels that her symptoms have worsened again.  Past Medical/Surgical History: Past Medical History:  Diagnosis Date   Anxiety attack    "take RX prn" (08/13/2013)   Arthritis    "probably" (08/13/2013)   Atrial fibrillation ([place])    new onset/pt report 08/13/2013   Bladder wall thickening 07/01/2021   CT scan in Epic   Bronchitis 12/2011   Cholelithiasis 07/01/2021   CT in Epic.   Common bile duct dilation 07/01/2021   CT in Epic:  13 mm.   Complication of anesthesia    Compression fracture of first lumbar vertebra ([place]) 07/01/2021   CT in Epic.   Depression    Femur fracture (HCC)    left proximal femur non-union   GERD (gastroesophageal reflux disease)    H/O hiatal hernia    Hardware complicating wound infection ([place]) 08/22/2016   History of blood transfusion    "w/both knee OR's" (08/13/2013)   Hyperlipidemia    Hyperlipidemia    Hypertension    Hypertensive urgency 10/22/2018   Hypothyroidism    takes synthroid   Insomnia    Intertrochanteric fracture of right hip ([place]) 07/30/2015   Neuromuscular disorder (HCC)    carpal tunnel in right hand   Periprosthetic fracture around internal prosthetic right hip joint ([place]), nonunion basicervical femoral neck with broken hardware and subtrochanteric fracture 03/16/2016   PONV (postoperative nausea and vomiting)    Prosthetic hip infection ([place]) 08/22/2016   Proteus infection 08/22/2016   Serratia infection 11/14/2016   Shingles    Skin cancer of face 2000's   "one on each side of my face and one on my nose" (08/13/2013)   Swelling of  extremity    sees Dr. Gwenlyn Found, cardiologist 1 x year [phone]   Tendon dysfunction 08/22/2016   Wears hearing aid    [facility]    Past Surgical History:  Procedure Laterality Date   2D ECHOCARDIOGRAM  12/07/2004   EF 48%, moderate pulmonary hypertension   BREAST BIOPSY Left ?1970's    CARDIOVASCULAR STRESS TEST  12/07/11   CARDIOVASCULAR STRESS TEST  12/07/2011   LEXISCAN, normal scan, no significant wall abnormalities noted   CARPAL TUNNEL RELEASE Right ~ 2005   CATARACT EXTRACTION, BILATERAL Bilateral 2000   CYSTOSCOPY  11/02/2021   Procedure: CYSTOSCOPY;  Surgeon: Lafonda Mosses, MD;  [facility];  Service: Gynecology;;   DILATION AND CURETTAGE OF UTERUS  1950's   DOPPLER ECHOCARDIOGRAPHY  12/07/11   EYE SURGERY     FEMUR IM NAIL Right 08/01/2015   Procedure: INTRAMEDULLARY (IM) NAIL FEMORAL;  Surgeon: Marchia Bond, MD;  Location: [facility];  Service: Orthopedics;  Laterality: Right;   HARDWARE REMOVAL Right 03/16/2016   Procedure: HARDWARE REMOVAL;  Surgeon: Marchia Bond, MD;  Location: [place];  Service: Orthopedics;  Laterality: Right;   INTRAMEDULLARY (IM) NAIL INTERTROCHANTERIC Left 01/26/2017   Procedure: INTRAMEDULLARY (IM) NAIL INTERTROCHANTRIC;  Surgeon: Leandrew Koyanagi, MD;  Location: [facility];  Service: Orthopedics;  Laterality: Left;   JOINT REPLACEMENT     bilateral knee replacement   ROBOTIC ASSISTED BILATERAL SALPINGO OOPHERECTOMY Bilateral 11/02/2021   Procedure: XI ROBOTIC ASSISTED BILATERAL SALPINGO OOPHORECTOMY;  Surgeon: Lafonda Mosses, MD;  [facility];  Service: Gynecology;  Laterality: Bilateral;   ROBOTIC ASSISTED TOTAL HYSTERECTOMY N/A 11/02/2021   Procedure: XI ROBOTIC ASSISTED TOTAL HYSTERECTOMY WITH  MINI LAPAROTOMY;  Surgeon: Lafonda Mosses, MD;  [facility];  Service: Gynecology;  Laterality: N/A;   SKIN CANCER EXCISION  2000's   "off my nose" (08/13/2013)   TOTAL HIP ARTHROPLASTY Right 03/16/2016   Procedure: TOTAL HIP ARTHROPLASTY;  Surgeon: Marchia Bond, MD;  Location: [facility];  Service: Orthopedics;  Laterality: Right;   TOTAL HIP ARTHROPLASTY Left 06/09/2017   Procedure: REMOVE LEFT HIP INTRAMEDULLARY NAIL AND CONVERT TO LEFT CEMENTED HEMI HIP ARTHROPLASTY;  Surgeon: Leandrew Koyanagi, MD;  Location: [place];  Service:  Orthopedics;  Laterality: Left;   TOTAL KNEE ARTHROPLASTY Right 2005   TOTAL KNEE ARTHROPLASTY  03/02/2012   Procedure: TOTAL KNEE ARTHROPLASTY;  Surgeon: Yvette Rack., MD;  Location: [facility];  Service: Orthopedics;  Laterality: Left;    Family History  Problem Relation Age of Onset   Arrhythmia Mother    Heart disease Mother    Stroke Father 58   Arrhythmia Sister    Heart disease Sister    Heart disease Sister    Stroke Sister    Diabetes Sister    Heart disease Brother    Heart disease Brother    Cancer Brother        Colon cancer   Arrhythmia Brother    Heart disease Brother    Heart disease Child 21   Heart disease Child 56   Breast cancer Neg Hx    Ovarian cancer Neg Hx    Endometrial cancer Neg Hx    Pancreatic cancer Neg Hx    Prostate cancer Neg Hx     Social History   Socioeconomic History   Marital status: Widowed    Spouse name: Not on file   Number of children: Not on file   Years of education: Not on file  Highest education level: Not on file  Occupational History   Not on file  Tobacco Use   Smoking status: Never   Smokeless tobacco: Never  Vaping Use   Vaping Use: Never used  Substance and Sexual Activity   Alcohol use: No   Drug use: No   Sexual activity: Not Currently  Other Topics Concern   Not on file  Social History Narrative   Widowed. Lives at [facility]. Son died in the [place] War and she has not slept well since then.   Admitted to [facility] 01/30/17   Never smoked   Alcohol none   Full Code   Social Determinants of Health   Financial Resource Strain: Not on file  Food Insecurity: Not on file  Transportation Needs: Not on file  Physical Activity: Not on file  Stress: Not on file  Social Connections: Not on file    Current Medications:  Current Outpatient Medications:    acetaminophen (TYLENOL) 325 MG tablet, Take 650 mg by mouth every 6 (six) hours as needed for moderate pain. , Disp: , Rfl:     chlorthalidone (HYGROTON) 25 MG tablet, TAKE 1 TABLET ONCE DAILY., Disp: 90 tablet, Rfl: 3   Cholecalciferol 2000 units CAPS, Take 2,000 Units by mouth every evening., Disp: , Rfl:    denosumab (PROLIA) 60 MG/ML SOLN injection, Inject 60 mg into the skin every 6 (six) months. Administer in upper arm, thigh, or abdomen, Disp: , Rfl:    diltiazem (CARDIZEM CD) 180 MG 24 hr capsule, TAKE 1 CAPSULE DAILY., Disp: 90 capsule, Rfl: 2   ELIQUIS 2.5 MG TABS tablet, TAKE 1 TABLET BY MOUTH TWICE DAILY., Disp: 180 tablet, Rfl: 1   Flaxseed, Linseed, (FLAXSEED OIL) 1000 MG CAPS, Take 1,000 mg by mouth 2 (two) times daily. , Disp: , Rfl:    furosemide (LASIX) 20 MG tablet, Take 20 mg by mouth daily as needed for fluid or edema., Disp: , Rfl:    levothyroxine (SYNTHROID) 112 MCG tablet, Take 112 mcg by mouth daily before breakfast. , Disp: , Rfl:    magnesium hydroxide (MILK OF MAGNESIA) 400 MG/5ML suspension, Take 30 mLs by mouth daily as needed for mild constipation., Disp: , Rfl:    metoprolol (LOPRESSOR) 50 MG tablet, Take 50 mg by mouth 2 (two) times daily., Disp: , Rfl:    Multiple Vitamins-Minerals (PRESERVISION AREDS 2+MULTI VIT PO), Take 1 capsule by mouth 2 (two) times daily., Disp: , Rfl:    potassium chloride SA (KLOR-CON) 20 MEQ tablet, Take 20 mEq by mouth daily as needed (Take with each furosemide dose)., Disp: , Rfl:    senna (SENOKOT) 8.6 MG tablet, Take 2 tablets by mouth at bedtime., Disp: , Rfl:    sulfamethoxazole-trimethoprim (BACTRIM) 400-80 MG tablet, Take 1 tablet by mouth 2 (two) times daily., Disp: 60 tablet, Rfl: 0   temazepam (RESTORIL) 30 MG capsule, Take 1 capsule (30 mg total) by mouth at bedtime as needed for sleep. (Patient taking differently: Take 30 mg by mouth at bedtime.), Disp: 30 capsule, Rfl: 0   traMADol (ULTRAM) 50 MG tablet, Take 1 tablet (50 mg total) by mouth every 6 (six) hours as needed for severe pain. Do not take and drive (Patient not taking: Reported on  11/22/2021), Disp: 10 tablet, Rfl: 0  Review of Systems: Pertinent positives as per HPI. Denies appetite changes, fevers, chills, unexplained weight changes. Denies hearing loss, neck lumps or masses, mouth sores, ringing in ears or voice changes.  Denies cough or wheezing.  Denies shortness of breath. Denies chest pain or palpitations. Denies leg swelling. Denies abdominal distention, pain, blood in stools, diarrhea, nausea, vomiting, or early satiety. Denies pain with intercourse, dysuria, frequency, hematuria. Denies hot flashes, pelvic pain, vaginal bleeding or vaginal discharge.   Denies joint pain, back pain or muscle pain/cramps. Denies itching, rash, or wounds. Denies headaches, numbness or seizures. Denies swollen lymph nodes or glands, denies easy bruising or bleeding. Denies anxiety, depression, confusion, or decreased concentration.  Physical Exam: BP (!) 171/87 (BP Location: Left Arm, Patient Position: Sitting)    Pulse 69    Temp 98 F (36.7 C) (Oral)    Resp 14    Ht 5' 1.81" (1.57 m)    Wt 129 lb 9.6 oz (58.8 kg)    SpO2 94%    BMI 23.85 kg/m  General: Alert, oriented, no acute distress. HEENT: Normocephalic, atraumatic, sclera anicteric. Chest: Clear to auscultation bilaterally.  No wheezes or rhonchi. Abdomen: soft, nontender, minimally distended.  Normoactive bowel sounds.  No masses or hepatosplenomegaly appreciated.  Well-healed incisions. Extremities: Grossly normal range of motion.  Warm, well perfused.  No edema bilaterally. Skin: No rashes or lesions noted. Lymphatics: No cervical, supraclavicular, or inguinal adenopathy. GU: Normal appearing external genitalia without erythema, excoriation, or lesions.  Speculum exam reveals moderately atrophic vaginal mucosa, small oozing with manipulation of the speculum.  Cuff intact, no obvious tumor at the vaginal cuff.  Bimanual exam reveals cuff intact, no nodularity or masses.    Laboratory & Radiologic Studies: None  new  Assessment & Plan: Leah Holland is a 88 y.o. woman with incompletely staged deeply myoinvasive uterine high-grade carcinoma with clear cell features presenting for surveillance.   The patient is overall doing well without significant increase in symptoms over the last 3 months since I saw her.  Hospice is coming to evaluate her in the next week.   We discussed the utility of imaging.  The patient is still not interested in any sort of systemic therapy.  If imaging were to show metastatic disease, we would continue to pursue quality of life.  I worry that anxiety related to CT findings, extra appointment, and cost will be burdensome to the patient.  She is in agreement with this.  From a quality-of-life standpoint, she continues to be very bothered by her urinary incontinence.  She has to wear depends all the time.  She has seen a urologist before and it sounds like there is been some discussion of pessary.  While her vagina is somewhat shortened and atrophic, I think it would be worthwhile to have her evaluated for possible pessary.  I discussed referral to urogynecology for this discussion.  Given her atrophic vaginal mucosa, she may benefit from vaginal estrogen.  She would like to wait for her consult visit to make any sort of decision about vaginal estrogen use.   In terms of follow-up, we discussed continuing with visits every 3 months.  I offered that this could be a phone visit to help decrease the burden of coming in for an in person visit.  The patient was interested in this.  She was encouraged to call if she develops worsening symptoms before her next visit.  32 minutes of total time was spent for this patient encounter, including preparation, face-to-face counseling with the patient and coordination of care, and documentation of the encounter.  [name], MD  Division of Gynecologic Oncology  Department of Obstetrics and Gynecology  [facility]  of [facility]

## 2022-02-22 DIAGNOSIS — L814 Other melanin hyperpigmentation: Secondary | ICD-10-CM | POA: Diagnosis not present

## 2022-02-22 DIAGNOSIS — L570 Actinic keratosis: Secondary | ICD-10-CM | POA: Diagnosis not present

## 2022-02-22 DIAGNOSIS — L821 Other seborrheic keratosis: Secondary | ICD-10-CM | POA: Diagnosis not present

## 2022-02-25 ENCOUNTER — Other Ambulatory Visit: Payer: Medicare Other | Admitting: Family Medicine

## 2022-02-28 ENCOUNTER — Ambulatory Visit (INDEPENDENT_AMBULATORY_CARE_PROVIDER_SITE_OTHER): Payer: Medicare Other | Admitting: Infectious Disease

## 2022-02-28 ENCOUNTER — Other Ambulatory Visit: Payer: Self-pay

## 2022-02-28 VITALS — BP 175/85 | HR 68 | Temp 97.1°F | Resp 16 | Ht 61.8 in | Wt 130.0 lb

## 2022-02-28 DIAGNOSIS — T8459XS Infection and inflammatory reaction due to other internal joint prosthesis, sequela: Secondary | ICD-10-CM

## 2022-02-28 DIAGNOSIS — T8451XD Infection and inflammatory reaction due to internal right hip prosthesis, subsequent encounter: Secondary | ICD-10-CM

## 2022-02-28 DIAGNOSIS — I10 Essential (primary) hypertension: Secondary | ICD-10-CM | POA: Diagnosis not present

## 2022-02-28 DIAGNOSIS — Z9889 Other specified postprocedural states: Secondary | ICD-10-CM

## 2022-02-28 DIAGNOSIS — C541 Malignant neoplasm of endometrium: Secondary | ICD-10-CM | POA: Diagnosis not present

## 2022-02-28 DIAGNOSIS — Z96653 Presence of artificial knee joint, bilateral: Secondary | ICD-10-CM | POA: Diagnosis not present

## 2022-02-28 DIAGNOSIS — T847XXD Infection and inflammatory reaction due to other internal orthopedic prosthetic devices, implants and grafts, subsequent encounter: Secondary | ICD-10-CM

## 2022-02-28 MED ORDER — SULFAMETHOXAZOLE-TRIMETHOPRIM 400-80 MG PO TABS: 1.0000 | ORAL_TABLET | Freq: Two times a day (BID) | ORAL | 11 refills | Status: DC

## 2022-02-28 NOTE — Progress Notes (Signed)
? ? ? ? ? ?Subjective:  ? ?Chief complaint: ? ?Follow-up for PJI ? ? ? [name], female    DOB: 07/30/1922, 88 y.o.   MRN: [id number] ? ?HPI ? ?Leah Holland is a 88 year old lady with admission in April of 2017 with right intertrochanteric proximal femur nonunion who progressed to ultimately break her intramedullary nail and elected for surgical management. She had removal of hardware including intramedullary nail, and treatment of intertrochanteric fracture with prosthesis placement. Dr. [name] got several intra-operative cultures due to concerrn for infection and two separate cultures grew serratia. Patient was placed on oral levaquin after discussion with me and has remained on this some time.. She has done relatively well but recently began to complain of pain behind her prosthetic knees which she believed might be due to toxicity of fluoroquinolone to tendons after she and her son--who is a Pediatrician read about side effects of FQ.  ? ?I was  actually skeptical about FQ toxicity here but I DID think she would be better served over the long run if she is to be on chronic suppressive antibiotics NOT being on a fluoroquinolone given risk for Clostridium difficile colitis in particular. We changed her  over to single strength Bactrim and her tendon pain went away immediately. She has had normal creatinine and potassium while on single strength Bactrim. ? ?She then  fractured her opposite hip and underwent IM nail by Dr. Erlinda Hong  ? ?She ended up needing a THA on that side as well.  ? ?Bilateral knee replacements as well. ? ?She then had a  periprosthetic fracture of left femur which was treated non operatively with having her be not weight bearing. ? ?She then developed quadriceps tendinitis when she did bear weight because she was bearing it more so on the right. ? ?She is being followed closely by Dr. Erlinda Hong. ? ?She is now  ? ?She was found to have a poorly differentiated carcinoma when she recently  had TAH. ? ?She has elected against systemic chemotherapy. ? ?She has remained on her bactrim and does not want to come off of this antibiotic and risk recurrence. ? ? ?Past Medical History:  ?Diagnosis Date  ? Anxiety attack   ? "take RX prn" (08/13/2013)  ? Arthritis   ? "probably" (08/13/2013)  ? Atrial fibrillation ([place])   ? new onset/pt report 08/13/2013  ? Bladder wall thickening 07/01/2021  ? CT scan in Epic  ? Bronchitis 12/2011  ? Cholelithiasis 07/01/2021  ? CT in Epic.  ? Common bile duct dilation 07/01/2021  ? CT in Epic:  13 mm.  ? Complication of anesthesia   ? Compression fracture of first lumbar vertebra ([place]) 07/01/2021  ? CT in Epic.  ? Depression   ? Femur fracture ([place])   ? left proximal femur non-union  ? GERD (gastroesophageal reflux disease)   ? H/O hiatal hernia   ? Hardware complicating wound infection ([place]) 08/22/2016  ? History of blood transfusion   ? "w/both knee OR's" (08/13/2013)  ? Hyperlipidemia   ? Hyperlipidemia   ? Hypertension   ? Hypertensive urgency 10/22/2018  ? Hypothyroidism   ? takes synthroid  ? Insomnia   ? Intertrochanteric fracture of right hip ([place]) 07/30/2015  ? Neuromuscular disorder ([place])   ? carpal tunnel in right hand  ? Periprosthetic fracture around internal prosthetic right hip joint ([place]), nonunion basicervical femoral neck with broken hardware and subtrochanteric fracture 03/16/2016  ? PONV (postoperative nausea  and vomiting)   ? Prosthetic hip infection ([place]) 08/22/2016  ? Proteus infection 08/22/2016  ? Serratia infection 11/14/2016  ? Shingles   ? Skin cancer of face 2000's  ? "one on each side of my face and one on my nose" (08/13/2013)  ? Swelling of extremity   ? sees Dr. Gwenlyn Found, cardiologist 1 x year [phone]  ? Tendon dysfunction 08/22/2016  ? Wears hearing aid   ? HOH  ? ? ?Past Surgical History:  ?Procedure Laterality Date  ? 2D ECHOCARDIOGRAM  12/07/2004  ? EF 48%, moderate pulmonary hypertension  ? BREAST BIOPSY Left ?1970's  ? CARDIOVASCULAR STRESS TEST   12/07/11  ? CARDIOVASCULAR STRESS TEST  12/07/2011  ? LEXISCAN, normal scan, no significant wall abnormalities noted  ? CARPAL TUNNEL RELEASE Right ~ 2005  ? CATARACT EXTRACTION, BILATERAL Bilateral 2000  ? CYSTOSCOPY  11/02/2021  ? Procedure: CYSTOSCOPY;  Surgeon: Lafonda Mosses, MD;  [facility];  Service: Gynecology;;  ? DILATION AND CURETTAGE OF UTERUS  1950's  ? DOPPLER ECHOCARDIOGRAPHY  12/07/11  ? EYE SURGERY    ? FEMUR IM NAIL Right 08/01/2015  ? Procedure: INTRAMEDULLARY (IM) NAIL FEMORAL;  Surgeon: Marchia Bond, MD;  Location: [place];  Service: Orthopedics;  Laterality: Right;  ? HARDWARE REMOVAL Right 03/16/2016  ? Procedure: HARDWARE REMOVAL;  Surgeon: Marchia Bond, MD;  Location: [facility];  Service: Orthopedics;  Laterality: Right;  ? INTRAMEDULLARY (IM) NAIL INTERTROCHANTERIC Left 01/26/2017  ? Procedure: INTRAMEDULLARY (IM) NAIL INTERTROCHANTRIC;  Surgeon: Leandrew Koyanagi, MD;  Location: [facility];  Service: Orthopedics;  Laterality: Left;  ? JOINT REPLACEMENT    ? bilateral knee replacement  ? ROBOTIC ASSISTED BILATERAL SALPINGO OOPHERECTOMY Bilateral 11/02/2021  ? Procedure: XI ROBOTIC ASSISTED BILATERAL SALPINGO OOPHORECTOMY;  Surgeon: Lafonda Mosses, MD;  [facility];  Service: Gynecology;  Laterality: Bilateral;  ? ROBOTIC ASSISTED TOTAL HYSTERECTOMY N/A 11/02/2021  ? Procedure: XI ROBOTIC ASSISTED TOTAL HYSTERECTOMY WITH  MINI LAPAROTOMY;  Surgeon: Lafonda Mosses, MD;  [facility];  Service: Gynecology;  Laterality: N/A;  ? SKIN CANCER EXCISION  2000's  ? "off my nose" (08/13/2013)  ? TOTAL HIP ARTHROPLASTY Right 03/16/2016  ? Procedure: TOTAL HIP ARTHROPLASTY;  Surgeon: Marchia Bond, MD;  Location: [facility];  Service: Orthopedics;  Laterality: Right;  ? TOTAL HIP ARTHROPLASTY Left 06/09/2017  ? Procedure: REMOVE LEFT HIP INTRAMEDULLARY NAIL AND CONVERT TO LEFT CEMENTED HEMI HIP ARTHROPLASTY;  Surgeon: Leandrew Koyanagi, MD;  Location: [facility];  Service: Orthopedics;  Laterality: Left;  ?  TOTAL KNEE ARTHROPLASTY Right 2005  ? TOTAL KNEE ARTHROPLASTY  03/02/2012  ? Procedure: TOTAL KNEE ARTHROPLASTY;  Surgeon: Yvette Rack., MD;  Location: [facility];  Service: Orthopedics;  Laterality: Left;  ? ? ?Family History  ?Problem Relation Age of Onset  ? Arrhythmia Mother   ? Heart disease Mother   ? Stroke Father 72  ? Arrhythmia Sister   ? Heart disease Sister   ? Heart disease Sister   ? Stroke Sister   ? Diabetes Sister   ? Heart disease Brother   ? Heart disease Brother   ? Cancer Brother   ?     Colon cancer  ? Arrhythmia Brother   ? Heart disease Brother   ? Heart disease Child 29  ? Heart disease Child 68  ? Breast cancer Neg Hx   ? Ovarian cancer Neg Hx   ? Endometrial cancer Neg Hx   ? Pancreatic cancer  Neg Hx   ? Prostate cancer Neg Hx   ? ? ?  ?Social History  ? ?Socioeconomic History  ? Marital status: Widowed  ?  Spouse name: Not on file  ? Number of children: Not on file  ? Years of education: Not on file  ? Highest education level: Not on file  ?Occupational History  ? Not on file  ?Tobacco Use  ? Smoking status: Never  ? Smokeless tobacco: Never  ?Vaping Use  ? Vaping Use: Never used  ?Substance and Sexual Activity  ? Alcohol use: No  ? Drug use: No  ? Sexual activity: Not Currently  ?Other Topics Concern  ? Not on file  ?Social History Narrative  ? Widowed. Lives at [facility]. Son died in the [place] War and she has not slept well since then.  ? Admitted to [facility] 01/30/17  ? Never smoked  ? Alcohol none  ? Full Code  ? ?Social Determinants of Health  ? ?Financial Resource Strain: Not on file  ?Food Insecurity: Not on file  ?Transportation Needs: Not on file  ?Physical Activity: Not on file  ?Stress: Not on file  ?Social Connections: Not on file  ? ? ?Allergies  ?Allergen Reactions  ? Levaquin [Levofloxacin] Other (See Comments)  ?  tendinopathy  ? Morphine And Related Other (See Comments)  ?  Made pt "wild"  ? ? ? ?Current Outpatient Medications:  ?  acetaminophen  (TYLENOL) 325 MG tablet, Take 650 mg by mouth every 6 (six) hours as needed for moderate pain. , Disp: , Rfl:  ?  chlorthalidone (HYGROTON) 25 MG tablet, TAKE 1 TABLET ONCE DAILY., Disp: 90 tablet, Rfl: 3 ?  C

## 2022-03-01 LAB — CBC WITH DIFFERENTIAL/PLATELET
Absolute Monocytes: 548 cells/uL (ref 200–950)
Basophils Absolute: 38 cells/uL (ref 0–200)
Basophils Relative: 0.6 %
Eosinophils Absolute: 176 cells/uL (ref 15–500)
Eosinophils Relative: 2.8 %
HCT: 45.6 % — ABNORMAL HIGH (ref 35.0–45.0)
Hemoglobin: 14.9 g/dL (ref 11.7–15.5)
Lymphs Abs: 2274 cells/uL (ref 850–3900)
MCH: 30 pg (ref 27.0–33.0)
MCHC: 32.7 g/dL (ref 32.0–36.0)
MCV: 91.8 fL (ref 80.0–100.0)
MPV: 10.4 fL (ref 7.5–12.5)
Monocytes Relative: 8.7 %
Neutro Abs: 3263 cells/uL (ref 1500–7800)
Neutrophils Relative %: 51.8 %
Platelets: 213 10*3/uL (ref 140–400)
RBC: 4.97 10*6/uL (ref 3.80–5.10)
RDW: 12.6 % (ref 11.0–15.0)
Total Lymphocyte: 36.1 %
WBC: 6.3 10*3/uL (ref 3.8–10.8)

## 2022-03-01 LAB — BASIC METABOLIC PANEL WITH GFR
BUN/Creatinine Ratio: 17 (calc) (ref 6–22)
BUN: 18 mg/dL (ref 7–25)
CO2: 29 mmol/L (ref 20–32)
Calcium: 10.7 mg/dL — ABNORMAL HIGH (ref 8.6–10.4)
Chloride: 100 mmol/L (ref 98–110)
Creat: 1.06 mg/dL — ABNORMAL HIGH (ref 0.60–0.95)
Glucose, Bld: 93 mg/dL (ref 65–99)
Potassium: 4 mmol/L (ref 3.5–5.3)
Sodium: 138 mmol/L (ref 135–146)
eGFR: 48 mL/min/{1.73_m2} — ABNORMAL LOW (ref >=60)

## 2022-03-01 LAB — C-REACTIVE PROTEIN: CRP: 0.8 mg/L (ref <8.0)

## 2022-03-01 LAB — SEDIMENTATION RATE: Sed Rate: 2 mm/h (ref 0–30)

## 2022-03-08 ENCOUNTER — Encounter: Payer: Self-pay | Admitting: Family Medicine

## 2022-03-08 ENCOUNTER — Telehealth: Payer: Self-pay | Admitting: *Deleted

## 2022-03-08 ENCOUNTER — Other Ambulatory Visit: Payer: Self-pay

## 2022-03-08 ENCOUNTER — Other Ambulatory Visit: Payer: Medicare Other | Admitting: Family Medicine

## 2022-03-08 VITALS — BP 138/80 | HR 72 | Temp 97.9°F | Resp 16

## 2022-03-08 DIAGNOSIS — N393 Stress incontinence (female) (male): Secondary | ICD-10-CM

## 2022-03-08 DIAGNOSIS — Z515 Encounter for palliative care: Secondary | ICD-10-CM

## 2022-03-08 DIAGNOSIS — Z7901 Long term (current) use of anticoagulants: Secondary | ICD-10-CM

## 2022-03-08 DIAGNOSIS — Z792 Long term (current) use of antibiotics: Secondary | ICD-10-CM

## 2022-03-08 DIAGNOSIS — C541 Malignant neoplasm of endometrium: Secondary | ICD-10-CM | POA: Diagnosis not present

## 2022-03-08 NOTE — Progress Notes (Signed)
? ? ?Manufacturing engineer ?Community Palliative Care Consult Note ?Telephone: [phone]  ?Fax: [phone]  ? ?Date of encounter: 03/08/22 ?9:50 AM ?PATIENT NAME: Leah Holland ?[place] [address]   ?[phone] (home)  ?DOB: August 10, 1922 ?MRN: [id number] ?PRIMARY CARE PROVIDER:    ?Leah Shanks, MD,  ?[place] ?[place] [address] ?[phone] ? ?REFERRING PROVIDER:   ?Leah Shanks, MD ?[place],  [place] [address] ?[phone] ? ?RESPONSIBLE PARTY:    ?Contact Information   ? ? Name Relation Home Work Mobile  ? Leah, Holland Son [phone]  [phone]  ? Leah, Holland [phone]  [phone]  ? ?  ? ? ? ?I met face to face with patient and family (sons Leah Holland and Holland Pleasant) in her independent living home. Palliative Care was asked to follow this patient by consultation request of  Leah Shanks, MD to address advance care planning and complex medical decision making. This is the initial visit.  ? ? ?      ASSESSMENT, SYMPTOM MANAGEMENT AND PLAN / RECOMMENDATIONS:  ?Palliative Care Encounter ?Son Leah Holland, retired physician is her [facility] POA. ?MOST completed. ?Has incidental finding of dilated CBD and cholelithiasis. ? ?Endometrial carcinoma ?Clear cell uterine cancer, has robotic assisted total hysterectomy and bilat SO. ?No further treatment desired. ? ?Stress Incontinence ?Has scheduled follow up with Urogynecology, possible pessary. ?Advised of acetylcholine side effects of OAB agents ?Encouraged regular bowel movements and continued pelvic exercises. ? ?Chronic Anticoagulation ?Stable on Eliquis 2.5 mg BID. ?On 02/28/22 Cr 1.06, BUN 18 and eGFR 48 ?Encourage slow position change and notification of PCP for any falls. ? ?Chronic Antibiotic suppression ?Although no active infection noted in hip replacement, culture was positive, pt follows with ID and is on chronic suppressive therapy with Bactrim 400-80 mg BID. ? ?Follow up Palliative  Care Visit: Palliative care will continue to follow for complex medical decision making, advance care planning, and clarification of goals. Return 5 weeks or prn. ? ? ? ?This visit was coded based on medical decision making (MDM). ? ?PPS: 70% ? ?HOSPICE ELIGIBILITY/DIAGNOSIS: TBD ? ?Chief Complaint:  ?[facility] received a referral to follow up with patient for chronic disease management with diagnosis of advanced clear cell uterine cancer.  Follow up on advance directive and defining/refining goals of care. ? ?HISTORY OF PRESENT ILLNESS:  Leah Holland is a 88 y.o. year old female recently diagnosed with clear cell uterine cancer and underwent robotic assisted total hysterectomy with salpingo-oophorectomy.  She was told by GYN Oncologist that her cancer is aggressive and likely to recur.  She went for treatment of some urge incontinence and that is when they found the mass and pyometra.  She denies fever, vaginal d/c or bleeding.  Currently she is asymptomatic. Prognosis indicated as weeks to 8-10 months on average. She also has atrial fibrillation, hypertension, GERD, hypothyroidism, DM type 2 diet controlled with most recent HGB A1c 5.9% on no meds on 10/27/21, arthritis and prosthetic hip infection with positive culture but no active infection.  She is on prophylactic Bactrim 400-80 mg BID and follows up with infectious disease.  She c/o some nausea, no trouble with falls recently but has had both hips replaced previously due to falls.  Uses rollator for ambulation.  Eats well.  Independent with bathing and dressing.  Sleeping ok with sleeping medicine. She was referred by Gyn Oncologist to Urogynecologist for option to treat stress incontinence.  She has done  pelvic PT and Kegels with some improvement. She has been advised she may be a candidate for a pessary or topical vaginal estrogen therapy. No bowel incontinence and wears adult briefs for personal protection.  She states  deciding against chemotherapy, radiation or further work up of cancer because she is close to the end of her life span, doesn't want to lose her hair or have invasive treatments and be sick for whatever time remains for her.  Sons are in agreement and supportive. Son Leah Holland is a retired Engineer, drilling and her [place], son Leah Holland is her durable POA and takes her to get her hair done on Thursdays. ? ?History obtained from review of EMR, discussion with  family and/or Ms. Leah Holland.  ?I reviewed available labs, medications, imaging, studies and related documents from the EMR.  Records reviewed and summarized above.  ? ?ROS ?General: NAD ?EYES: denies vision changes ?ENMT: denies dysphagia, hard of hearing and reads lips ?Cardiovascular: denies chest pain, denies DOE ?Pulmonary: denies cough, denies increased SOB ?Abdomen: endorses good appetite, denies constipation, endorses continence of bowel ?GU: denies dysuria, endorses intermittent stress incontinence of urine ?MSK:  denies increased weakness, no recent falls reported ?Skin: denies rashes or wounds ?Neurological: denies pain, denies insomnia with use of sleeping pill ?Psych: Endorses positive mood ?Heme/lymph/immuno: denies bruises, abnormal bleeding ? ?Physical Exam: ?Current and past weights: 130 lbs as of 02/28/22, weight 10/11/21 was 130 lbs 6.4 ounces ?Constitutional: NAD ?General: WNWD ?EYES: anicteric sclera, lids intact, no discharge  ?ENMT: hard of hearing wears bilateral hearing aids, oral mucous membranes moist, dentition intact ?CV: S1S2, mildly irregularly irregular, no LE edema ?Pulmonary: CTAB, no increased work of breathing, no cough, room air ?Abdomen: normo-active BS + 4 quadrants, soft and non tender, no ascites ?GU: deferred ?MSK: no sarcopenia, moves all extremities, ambulatory ?Skin: warm and dry, no rashes or wounds on visible skin ?Neuro:  no generalized weakness, no cognitive impairment ?Psych: non-anxious affect, A and O x 3 ?Hem/lymph/immuno: no  widespread bruising ? ?CURRENT PROBLEM LIST:  ?Patient Active Problem List  ? Diagnosis Date Noted  ? Endometrial carcinoma ([place]) 02/28/2022  ? Pelvic mass in female 11/02/2021  ? Aortic atherosclerosis ([place]) 09/30/2021  ? Pelvic mass 09/29/2021  ? Dyspnea on exertion 02/12/2021  ? Hypophosphatemia 10/07/2020  ? Hypomagnesemia 10/07/2020  ? Hypokalemia 10/07/2020  ? Periprosthetic fracture around internal prosthetic left hip joint ([place]) 10/03/2020  ? Leg fracture, left, closed, initial encounter 10/01/2020  ? Hypertensive urgency 10/22/2018  ? Chronic low back pain 12/21/2017  ? Chronic antibiotic suppression 06/13/2017  ? History of hip replacement 06/09/2017  ? Ecchymosis 02/23/2017  ? Closed intertrochanteric fracture with nonunion, left 01/26/2017  ? Leukocytosis 01/26/2017  ? Fall 01/26/2017  ? Prosthetic hip infection ([place]) 08/22/2016  ? Tendinopathy 08/22/2016  ? Depression, recurrent ([place]) 04/07/2016  ? Arthritis of knee, degenerative 04/07/2016  ? OP (osteoporosis) 04/07/2016  ? Agoraphobia with panic disorder 04/07/2016  ? Chronic anticoagulation 08/06/2015  ? Bipolar disorder ([place]) 08/04/2015  ? Hyponatremia 08/04/2015  ? Constipation 08/04/2015  ? Complication of anesthesia 07/30/2015  ? Diabetes mellitus type 2, diet-controlled ([place])   ? Skin cancer of face   ? Paroxysmal atrial fibrillation ([place]) 09/01/2014  ? Right carotid bruit 11/18/2013  ? Postoperative anemia due to acute blood loss 03/03/2012  ? Hypertension   ? Hyperlipidemia   ? Swelling of extremity   ? Cataract   ? Hypothyroidism   ? GERD (gastroesophageal reflux disease)   ? Arthritis   ?  Insomnia   ? ?PAST MEDICAL HISTORY:  ?Active Ambulatory Problems  ?  Diagnosis Date Noted  ? Hypertension   ? Hyperlipidemia   ? Swelling of extremity   ? Cataract   ? Hypothyroidism   ? GERD (gastroesophageal reflux disease)   ? Arthritis   ? Insomnia   ? Postoperative anemia due to acute blood loss 03/03/2012  ? Right carotid bruit 11/18/2013  ?  Paroxysmal atrial fibrillation ([place]) 09/01/2014  ? Diabetes mellitus type 2, diet-controlled ([place])   ? Skin cancer of face   ? Complication of anesthesia 07/30/2015  ? Bipolar disorder ([place]) 08/04/2015  ? Hyponatremi

## 2022-03-08 NOTE — Telephone Encounter (Signed)
Called and spoke with the patient's daughter in law; gave the phone number for Urogyn to call for a new patient appt  ?

## 2022-03-16 DIAGNOSIS — E559 Vitamin D deficiency, unspecified: Secondary | ICD-10-CM | POA: Diagnosis not present

## 2022-03-16 DIAGNOSIS — M810 Age-related osteoporosis without current pathological fracture: Secondary | ICD-10-CM | POA: Diagnosis not present

## 2022-03-28 DIAGNOSIS — L814 Other melanin hyperpigmentation: Secondary | ICD-10-CM | POA: Diagnosis not present

## 2022-03-28 DIAGNOSIS — L570 Actinic keratosis: Secondary | ICD-10-CM | POA: Diagnosis not present

## 2022-03-28 DIAGNOSIS — L821 Other seborrheic keratosis: Secondary | ICD-10-CM | POA: Diagnosis not present

## 2022-04-12 ENCOUNTER — Other Ambulatory Visit: Payer: Medicare Other | Admitting: Family Medicine

## 2022-04-12 DIAGNOSIS — C541 Malignant neoplasm of endometrium: Secondary | ICD-10-CM | POA: Diagnosis not present

## 2022-04-12 DIAGNOSIS — R739 Hyperglycemia, unspecified: Secondary | ICD-10-CM | POA: Diagnosis not present

## 2022-04-12 DIAGNOSIS — G4700 Insomnia, unspecified: Secondary | ICD-10-CM | POA: Diagnosis not present

## 2022-04-12 DIAGNOSIS — E039 Hypothyroidism, unspecified: Secondary | ICD-10-CM | POA: Diagnosis not present

## 2022-04-12 DIAGNOSIS — M810 Age-related osteoporosis without current pathological fracture: Secondary | ICD-10-CM | POA: Diagnosis not present

## 2022-04-12 DIAGNOSIS — E785 Hyperlipidemia, unspecified: Secondary | ICD-10-CM | POA: Diagnosis not present

## 2022-04-12 DIAGNOSIS — R110 Nausea: Secondary | ICD-10-CM | POA: Diagnosis not present

## 2022-04-12 DIAGNOSIS — I10 Essential (primary) hypertension: Secondary | ICD-10-CM | POA: Diagnosis not present

## 2022-04-12 DIAGNOSIS — I4891 Unspecified atrial fibrillation: Secondary | ICD-10-CM | POA: Diagnosis not present

## 2022-04-13 ENCOUNTER — Other Ambulatory Visit: Payer: Self-pay | Admitting: Cardiovascular Disease

## 2022-04-13 DIAGNOSIS — H353131 Nonexudative age-related macular degeneration, bilateral, early dry stage: Secondary | ICD-10-CM | POA: Diagnosis not present

## 2022-04-13 DIAGNOSIS — H04123 Dry eye syndrome of bilateral lacrimal glands: Secondary | ICD-10-CM | POA: Diagnosis not present

## 2022-04-13 DIAGNOSIS — H0102A Squamous blepharitis right eye, upper and lower eyelids: Secondary | ICD-10-CM | POA: Diagnosis not present

## 2022-04-13 DIAGNOSIS — H35372 Puckering of macula, left eye: Secondary | ICD-10-CM | POA: Diagnosis not present

## 2022-04-29 DIAGNOSIS — Z23 Encounter for immunization: Secondary | ICD-10-CM | POA: Diagnosis not present

## 2022-05-18 ENCOUNTER — Ambulatory Visit (INDEPENDENT_AMBULATORY_CARE_PROVIDER_SITE_OTHER): Payer: Medicare Other | Admitting: Physician Assistant

## 2022-05-18 ENCOUNTER — Encounter: Payer: Self-pay | Admitting: Physician Assistant

## 2022-05-18 VITALS — BP 166/93 | HR 66 | Ht 61.0 in | Wt 130.0 lb

## 2022-05-18 DIAGNOSIS — I4821 Permanent atrial fibrillation: Secondary | ICD-10-CM | POA: Diagnosis not present

## 2022-05-18 DIAGNOSIS — E039 Hypothyroidism, unspecified: Secondary | ICD-10-CM

## 2022-05-18 DIAGNOSIS — E785 Hyperlipidemia, unspecified: Secondary | ICD-10-CM | POA: Diagnosis not present

## 2022-05-18 DIAGNOSIS — C541 Malignant neoplasm of endometrium: Secondary | ICD-10-CM | POA: Diagnosis not present

## 2022-05-18 DIAGNOSIS — I10 Essential (primary) hypertension: Secondary | ICD-10-CM | POA: Diagnosis not present

## 2022-05-18 NOTE — Progress Notes (Signed)
Cardiology Office Note:    Date:  05/20/2022   ID:  KAILEE ESSMAN, DOB [birth year], MRN [id number]  PCP:  [name], MD   [facility] Providers Cardiologist:  Quay Burow, MD     Referring MD: [name], MD   Chief Complaint  Patient presents with   Follow-up    Seen for Dr. Gwenlyn Found    History of Present Illness:    Leah Holland is a 88 y.o. female with a hx of permanent atrial fibrillation on Eliquis, hypertension, hyperlipidemia, bipolar disorder, and hypothyroidism.  Patient was noted to have new onset of atrial fibrillation with RVR in 2014.  She converted to normal sinus rhythm on IV Cardizem and was placed on low-dose Eliquis.  She was eventually discharged on oral Cardizem.  She has a history of recurrent left and right hip fracture requiring surgical intervention.  Her son is a retired pediatrician who felt that she may have an element of orthostatic hypotension that led to her fall in the past.  She is a resident at [facility].  The last time she was seen sinus rhythm was in March 2019, all the EKG obtained since then showed atrial fibrillation.  Last echocardiogram obtained on 01/18/2021 demonstrated EF 65 to 70%, no regional wall motion abnormality, flattened anterior ventricular septum in systole and diastole consistent with RV pressure and volume overload, RVSP 49.2 mmHg, mild to moderate MR, severe biatrial enlargement, moderate to severe TR.  Patient was last seen by Coletta Memos, NP on 10/20/2021 for follow-up and preoperative evaluation.  She was scheduled to undergo robotic assisted laparoscopic bilateral salpingo-oophorectomy with possible hysterectomy, she was cleared to do so during the last visit.  She underwent the procedure on 11/02/2021, pathology revealed poorly differentiated carcinoma of clear-cell feature of the uterus extending to subserosal myometrium, depth of invasion 95%.  Cervix was negative for carcinoma.  Patient was not interested in any  systemic therapy.  It was decided to pursue quality of life.  She has been seen by palliative care service on 03/08/2022.  Patient presents today with above his son today, overall, he is doing well from the cardiac perspective without significant chest pain or worsening shortness of breath.  She still walks around with a walker at friends home.  She did take her about 10 minutes to walk to the cafeteria, she has no problem doing that kind of activity.  She is euvolemic on physical exam.  Heart rate is very well controlled on Cardizem CD and metoprolol.  She will continue on the current therapy.  She can follow-up with Dr. Gwenlyn Found in 6 months.  I do not plan to repeat echocardiogram given her advanced age as it is unlikely to change her overall management plan.  Past Medical History:  Diagnosis Date   Anxiety attack    "take RX prn" (08/13/2013)   Arthritis    "probably" (08/13/2013)   Atrial fibrillation ([place])    new onset/pt report 08/13/2013   Bladder wall thickening 07/01/2021   CT scan in Epic   Bronchitis 12/2011   Cholelithiasis 07/01/2021   CT in Epic.   Common bile duct dilation 07/01/2021   CT in Epic:  13 mm.   Complication of anesthesia    Compression fracture of first lumbar vertebra ([place]) 07/01/2021   CT in Epic.   Depression    Femur fracture (HCC)    left proximal femur non-union   GERD (gastroesophageal reflux disease)    H/O hiatal  hernia    Hardware complicating wound infection ([place]) 08/22/2016   History of blood transfusion    "w/both knee OR's" (08/13/2013)   Hyperlipidemia    Hyperlipidemia    Hypertension    Hypertensive urgency 10/22/2018   Hypothyroidism    takes synthroid   Insomnia    Intertrochanteric fracture of right hip ([place]) 07/30/2015   Neuromuscular disorder ([place])    carpal tunnel in right hand   Periprosthetic fracture around internal prosthetic right hip joint ([place]), nonunion basicervical femoral neck with broken hardware and subtrochanteric fracture  03/16/2016   PONV (postoperative nausea and vomiting)    Prosthetic hip infection ([place]) 08/22/2016   Proteus infection 08/22/2016   Serratia infection 11/14/2016   Shingles    Skin cancer of face 2000's   "one on each side of my face and one on my nose" (08/13/2013)   Swelling of extremity    sees Dr. Gwenlyn Found, cardiologist 1 x year [phone]   Tendon dysfunction 08/22/2016   Wears hearing aid    [facility]    Past Surgical History:  Procedure Laterality Date   2D ECHOCARDIOGRAM  12/07/2004   EF 48%, moderate pulmonary hypertension   BREAST BIOPSY Left ?1970's   CARDIOVASCULAR STRESS TEST  12/07/11   CARDIOVASCULAR STRESS TEST  12/07/2011   LEXISCAN, normal scan, no significant wall abnormalities noted   CARPAL TUNNEL RELEASE Right ~ 2005   CATARACT EXTRACTION, BILATERAL Bilateral 2000   CYSTOSCOPY  11/02/2021   Procedure: CYSTOSCOPY;  Surgeon: Lafonda Mosses, MD;  Location: [facility];  Service: Gynecology;;   DILATION AND CURETTAGE OF UTERUS  1950's   DOPPLER ECHOCARDIOGRAPHY  12/07/11   EYE SURGERY     FEMUR IM NAIL Right 08/01/2015   Procedure: INTRAMEDULLARY (IM) NAIL FEMORAL;  Surgeon: Marchia Bond, MD;  Location: [facility];  Service: Orthopedics;  Laterality: Right;   HARDWARE REMOVAL Right 03/16/2016   Procedure: HARDWARE REMOVAL;  Surgeon: Marchia Bond, MD;  Location: [facility];  Service: Orthopedics;  Laterality: Right;   INTRAMEDULLARY (IM) NAIL INTERTROCHANTERIC Left 01/26/2017   Procedure: INTRAMEDULLARY (IM) NAIL INTERTROCHANTRIC;  Surgeon: Leandrew Koyanagi, MD;  Location: [facility];  Service: Orthopedics;  Laterality: Left;   JOINT REPLACEMENT     bilateral knee replacement   ROBOTIC ASSISTED BILATERAL SALPINGO OOPHERECTOMY Bilateral 11/02/2021   Procedure: XI ROBOTIC ASSISTED BILATERAL SALPINGO OOPHORECTOMY;  Surgeon: Lafonda Mosses, MD;  Location: [facility];  Service: Gynecology;  Laterality: Bilateral;   ROBOTIC ASSISTED TOTAL HYSTERECTOMY N/A 11/02/2021   Procedure: XI ROBOTIC  ASSISTED TOTAL HYSTERECTOMY WITH  MINI LAPAROTOMY;  Surgeon: Lafonda Mosses, MD;  [facility]: [facility];  Service: Gynecology;  Laterality: N/A;   SKIN CANCER EXCISION  2000's   "off my nose" (08/13/2013)   TOTAL HIP ARTHROPLASTY Right 03/16/2016   Procedure: TOTAL HIP ARTHROPLASTY;  Surgeon: Marchia Bond, MD;  Location: [facility];  Service: Orthopedics;  Laterality: Right;   TOTAL HIP ARTHROPLASTY Left 06/09/2017   Procedure: REMOVE LEFT HIP INTRAMEDULLARY NAIL AND CONVERT TO LEFT CEMENTED HEMI HIP ARTHROPLASTY;  Surgeon: Leandrew Koyanagi, MD;  Location: [facility];  Service: Orthopedics;  Laterality: Left;   TOTAL KNEE ARTHROPLASTY Right 2005   TOTAL KNEE ARTHROPLASTY  03/02/2012   Procedure: TOTAL KNEE ARTHROPLASTY;  Surgeon: Yvette Rack., MD;  Location: [place];  Service: Orthopedics;  Laterality: Left;    Current Medications: Current Meds  Medication Sig   acetaminophen (TYLENOL) 325 MG tablet Take 650 mg by mouth every 6 (six) hours  as needed for moderate pain.    chlorthalidone (HYGROTON) 25 MG tablet TAKE 1 TABLET ONCE DAILY.   Cholecalciferol 2000 units CAPS Take 2,000 Units by mouth every evening.   denosumab (PROLIA) 60 MG/ML SOLN injection Inject 60 mg into the skin every 6 (six) months. Administer in upper arm, thigh, or abdomen   diltiazem (CARDIZEM CD) 180 MG 24 hr capsule TAKE 1 CAPSULE DAILY.   DULoxetine HCl 40 MG CPEP 1 capsule   ELIQUIS 2.5 MG TABS tablet TAKE 1 TABLET BY MOUTH TWICE DAILY.   Flaxseed, Linseed, (FLAXSEED OIL) 1000 MG CAPS Take 1,000 mg by mouth 2 (two) times daily.    furosemide (LASIX) 20 MG tablet Take 20 mg by mouth daily as needed for fluid or edema.   levothyroxine (SYNTHROID) 112 MCG tablet Take 112 mcg by mouth daily before breakfast.    magnesium hydroxide (MILK OF MAGNESIA) 400 MG/5ML suspension Take 30 mLs by mouth daily as needed for mild constipation.   metoprolol (LOPRESSOR) 50 MG tablet Take 50 mg by mouth 2 (two) times daily.   Multiple Vitamins-Minerals  (PRESERVISION AREDS 2+MULTI VIT PO) Take 1 capsule by mouth 2 (two) times daily.   potassium chloride (KLOR-CON) 10 MEQ tablet Take 10 mEq by mouth as needed (when taking lasix).   senna (SENOKOT) 8.6 MG tablet Take 2 tablets by mouth at bedtime.   sulfamethoxazole-trimethoprim (BACTRIM) 400-80 MG tablet Take 1 tablet by mouth 2 (two) times daily.   temazepam (RESTORIL) 30 MG capsule Take 1 capsule (30 mg total) by mouth at bedtime as needed for sleep.   traMADol (ULTRAM) 50 MG tablet Take 1 tablet (50 mg total) by mouth every 6 (six) hours as needed for severe pain. Do not take and drive     Allergies:   Levaquin [levofloxacin], Morphine and related, and Morphine sulfate   Social History   Socioeconomic History   Marital status: Widowed    Spouse name: Not on file   Number of children: Not on file   Years of education: Not on file   Highest education level: Not on file  Occupational History   Not on file  Tobacco Use   Smoking status: Never   Smokeless tobacco: Never  Vaping Use   Vaping Use: Never used  Substance and Sexual Activity   Alcohol use: No   Drug use: No   Sexual activity: Not Currently  Other Topics Concern   Not on file  Social History Narrative   Widowed. Lives at [facility]. Son died in the [place] War and she has not slept well since then.   Admitted to [facility] 01/30/17   Never smoked   Alcohol none   Full Code   Social Determinants of Health   Financial Resource Strain: Not on file  Food Insecurity: Not on file  Transportation Needs: Not on file  Physical Activity: Not on file  Stress: Not on file  Social Connections: Not on file     Family History: The patient's family history includes Arrhythmia in her brother, mother, and sister; Cancer in her brother; Diabetes in her sister; Heart disease in her brother, brother, brother, mother, sister, and sister; Heart disease (age of onset: 26) in her child; Heart disease (age of onset:  64) in her child; Stroke in her sister; Stroke (age of onset: 35) in her father. There is no history of Breast cancer, Ovarian cancer, Endometrial cancer, Pancreatic cancer, or Prostate cancer.  ROS:   Please see  the history of present illness.     All other systems reviewed and are negative.  EKGs/Labs/Other Studies Reviewed:    The following studies were reviewed today:  Echo 01/18/2021  1. Left ventricular ejection fraction, by estimation, is 65 to 70%. Left  ventricular ejection fraction by 3D volume is 68 %. The left ventricle has  normal function. The left ventricle has no regional wall motion  abnormalities. Left ventricular diastolic   function could not be evaluated. There is the interventricular septum is  flattened in systole and diastole, consistent with right ventricular  pressure and volume overload.   2. Right ventricular systolic function is mildly reduced. The right  ventricular size is moderately enlarged. There is moderately elevated  pulmonary artery systolic pressure. The estimated right ventricular  systolic pressure is 08.6 mmHg.   3. Left atrial size was severely dilated.   4. Right atrial size was severely dilated.   5. The mitral valve is degenerative. Mild to moderate mitral valve  regurgitation. No evidence of mitral stenosis.   6. The tricuspid valve is abnormal. Tricuspid valve regurgitation is  moderate to severe.   7. The aortic valve is tricuspid. There is mild calcification of the  aortic valve. There is mild thickening of the aortic valve. Aortic valve  regurgitation is not visualized. Mild aortic valve sclerosis is present,  with no evidence of aortic valve  stenosis.   8. The inferior vena cava is normal in size with <50% respiratory  variability, suggesting right atrial pressure of 8 mmHg.   EKG:  EKG is ordered today.  The ekg ordered today demonstrates atrial fibrillation, heart rate 62 bpm.  Recent Labs: 10/27/2021: ALT 14 02/28/2022: BUN  18; Creat 1.06; Hemoglobin 14.9; Platelets 213; Potassium 4.0; Sodium 138  Recent Lipid Panel    Component Value Date/Time   CHOL 116 04/26/2013 2059   TRIG 56 04/26/2013 2059   HDL 45 04/26/2013 2059   CHOLHDL 2.6 04/26/2013 2059   VLDL 11 04/26/2013 2059   LDLCALC 60 04/26/2013 2059     Risk Assessment/Calculations:    CHA2DS2-VASc Score = 6   This indicates a 9.7% annual risk of stroke. The patient's score is based upon: CHF History: 0 HTN History: 1 Diabetes History: 1 Stroke History: 0 Vascular Disease History: 1 Age Score: 2 Gender Score: 1          Physical Exam:    VS:  BP (!) 166/93   Pulse 66   Ht '5\' 1"'$  (1.549 m)   Wt 130 lb (59 kg)   SpO2 94%   BMI 24.56 kg/m     Wt Readings from Last 3 Encounters:  05/18/22 130 lb (59 kg)  02/28/22 130 lb (59 kg)  02/21/22 129 lb 9.6 oz (58.8 kg)     GEN:  Well nourished, well developed in no acute distress HEENT: Normal NECK: No JVD; No carotid bruits LYMPHATICS: No lymphadenopathy CARDIAC: Irregularly irregular, no murmurs, rubs, gallops RESPIRATORY:  Clear to auscultation without rales, wheezing or rhonchi  ABDOMEN: Soft, non-tender, non-distended MUSCULOSKELETAL:  No edema; No deformity  SKIN: Warm and dry NEUROLOGIC:  Alert and oriented x 3 PSYCHIATRIC:  Normal affect   ASSESSMENT:    1. Permanent atrial fibrillation ([place])   2. Essential hypertension   3. Hyperlipidemia LDL goal <100   4. Hypothyroidism, unspecified type   5. Endometrial carcinoma (HCC)    PLAN:    In order of problems listed above:  Permanent atrial fibrillation: Rate controlled  on current therapy.  On low-dose Eliquis  Hypertension: Blood pressure mildly elevated today, however patient has a history of orthostatic hypotension.  We will avoid dropping her blood pressure too low  Hyperlipidemia: Flaxseed oil.  Hypothyroidism: Managed by primary care provider  Endometrial carcinoma: Previously underwent surgery in November  2022, pathology demonstrated poorly differentiated carcinoma.  Patient was not interested in adding additional treatment.           Medication Adjustments/Labs and Tests Ordered: Current medicines are reviewed at length with the patient today.  Concerns regarding medicines are outlined above.  Orders Placed This Encounter  Procedures   EKG 12-Lead   No orders of the defined types were placed in this encounter.   Patient Instructions  Medication Instructions:  Your physician recommends that you continue on your current medications as directed. Please refer to the Current Medication list given to you today.  *If you need a refill on your cardiac medications before your next appointment, please call your pharmacy*  Lab Work: NONE ordered at this time of appointment   If you have labs (blood work) drawn today and your tests are completely normal, you will receive your results only by: [place] (if you have MyChart) OR A paper copy in the mail If you have any lab test that is abnormal or we need to change your treatment, we will call you to review the results.  Testing/Procedures: NONE ordered at this time of appointment   Follow-Up: At [facility], you and your health needs are our priority.  As part of our continuing mission to provide you with exceptional heart care, we have created designated Provider Care Teams.  These Care Teams include your primary Cardiologist (physician) and Advanced Practice Providers (APPs -  Physician Assistants and Nurse Practitioners) who all work together to provide you with the care you need, when you need it.    Your next appointment:   6 month(s)  The format for your next appointment:   In Person  Provider:   Quay Burow, MD     Other Instructions   Important Information About Sugar         Signed, Almyra Deforest, [place]  05/20/2022 11:50 PM    [place]

## 2022-05-18 NOTE — Progress Notes (Signed)
g

## 2022-05-18 NOTE — Patient Instructions (Signed)
Medication Instructions:  Your physician recommends that you continue on your current medications as directed. Please refer to the Current Medication list given to you today.  *If you need a refill on your cardiac medications before your next appointment, please call your pharmacy*  Lab Work: NONE ordered at this time of appointment   If you have labs (blood work) drawn today and your tests are completely normal, you will receive your results only by: [place] (if you have MyChart) OR A paper copy in the mail If you have any lab test that is abnormal or we need to change your treatment, we will call you to review the results.  Testing/Procedures: NONE ordered at this time of appointment   Follow-Up: At [facility], you and your health needs are our priority.  As part of our continuing mission to provide you with exceptional heart care, we have created designated Provider Care Teams.  These Care Teams include your primary Cardiologist (physician) and Advanced Practice Providers (APPs -  Physician Assistants and Nurse Practitioners) who all work together to provide you with the care you need, when you need it.    Your next appointment:   6 month(s)  The format for your next appointment:   In Person  Provider:   Quay Burow, MD     Other Instructions   Important Information About [place]

## 2022-05-20 ENCOUNTER — Encounter: Payer: Self-pay | Admitting: Physician Assistant

## 2022-05-24 DIAGNOSIS — L814 Other melanin hyperpigmentation: Secondary | ICD-10-CM | POA: Diagnosis not present

## 2022-05-24 DIAGNOSIS — L821 Other seborrheic keratosis: Secondary | ICD-10-CM | POA: Diagnosis not present

## 2022-05-24 DIAGNOSIS — L570 Actinic keratosis: Secondary | ICD-10-CM | POA: Diagnosis not present

## 2022-05-27 ENCOUNTER — Encounter: Payer: Self-pay | Admitting: Gynecologic Oncology

## 2022-05-27 ENCOUNTER — Inpatient Hospital Stay: Payer: Medicare Other | Attending: Gynecologic Oncology | Admitting: Gynecologic Oncology

## 2022-05-27 DIAGNOSIS — N393 Stress incontinence (female) (male): Secondary | ICD-10-CM

## 2022-05-27 DIAGNOSIS — C541 Malignant neoplasm of endometrium: Secondary | ICD-10-CM | POA: Diagnosis not present

## 2022-05-27 NOTE — Progress Notes (Signed)
Gynecologic Oncology Telehealth Note: Gyn-Onc  I connected with Salley Slaughter on 05/27/22 at  4:30 PM EDT by telephone and verified that I am speaking with the correct person using two identifiers.  I discussed the limitations, risks, security and privacy concerns of performing an evaluation and management service by telemedicine and the availability of in-person appointments. I also discussed with the patient that there may be a patient responsible charge related to this service. The patient expressed understanding and agreed to proceed.  Other persons participating in the visit and their role in the encounter: Patient's son.  Patient's location: Assisted living facility in [place] location: [facility]  Reason for Visit: Phone follow-up in the setting of high risk uterine cancer  Treatment History: 06/2021: Patient seen initially for urinary incontinence. 07/01/2021: CT scan. 09/09/2021: Pelvic ultrasound shows enlarged uterus with internal mass and cystic/solid components.  Bilateral ovaries not identified, no adnexal masses appreciated. 09/24/2021: MRI A/P shows thick-walled cystic lesion in the midline pelvis, which appears to arise from the right ovary, measures 8.5 x 7.5 x 8.1 cm.  Findings highly concerning for cystic ovarian neoplasm.  No adenopathy or metastatic disease definitively noted. 11/02/2021: Robotic total hysterectomy with BSO, mini lap for specimen removal, cystoscopy.  Findings at surgery included enlarged uterus with pyometra, atrophic adnexa, retroperitoneal fibrosis and edema, significant diverticular disease, powder burn lesions on the uterus and pelvic peritoneum consistent with possible endometriosis. 11/02/2021: Final pathology reveals poorly differentiated carcinoma with clear cell features of the uterus, extends to subserosal myometrium.  Depth of invasion 95%, LVI not identified.  Cervix negative for carcinoma.  Adnexa negative for  carcinoma.   Interval History: Really tired when walks between room and dining room. Denies pain.  Appetite good. Had recent medication change (decreased Cymbalta). Bowels normal senna. Urinary incontinence stable, appt in July.   Past Medical/Surgical History: Past Medical History:  Diagnosis Date   Anxiety attack    "take RX prn" (08/13/2013)   Arthritis    "probably" (08/13/2013)   Atrial fibrillation ([place])    new onset/pt report 08/13/2013   Bladder wall thickening 07/01/2021   CT scan in Epic   Bronchitis 12/2011   Cholelithiasis 07/01/2021   CT in Epic.   Common bile duct dilation 07/01/2021   CT in Epic:  13 mm.   Complication of anesthesia    Compression fracture of first lumbar vertebra ([place]) 07/01/2021   CT in Epic.   Depression    Femur fracture (HCC)    left proximal femur non-union   GERD (gastroesophageal reflux disease)    H/O hiatal hernia    Hardware complicating wound infection ([place]) 08/22/2016   History of blood transfusion    "w/both knee OR's" (08/13/2013)   Hyperlipidemia    Hyperlipidemia    Hypertension    Hypertensive urgency 10/22/2018   Hypothyroidism    takes synthroid   Insomnia    Intertrochanteric fracture of right hip ([place]) 07/30/2015   Neuromuscular disorder (HCC)    carpal tunnel in right hand   Periprosthetic fracture around internal prosthetic right hip joint ([place]), nonunion basicervical femoral neck with broken hardware and subtrochanteric fracture 03/16/2016   PONV (postoperative nausea and vomiting)    Prosthetic hip infection ([place]) 08/22/2016   Proteus infection 08/22/2016   Serratia infection 11/14/2016   Shingles    Skin cancer of face 2000's   "one on each side of my face and one on my nose" (08/13/2013)   Swelling of extremity  sees Dr. Gwenlyn Found, cardiologist 1 x year [phone]   Tendon dysfunction 08/22/2016   Wears hearing aid    [facility]    Past Surgical History:  Procedure Laterality Date   2D ECHOCARDIOGRAM   12/07/2004   EF 48%, moderate pulmonary hypertension   BREAST BIOPSY Left ?1970's   CARDIOVASCULAR STRESS TEST  12/07/11   CARDIOVASCULAR STRESS TEST  12/07/2011   LEXISCAN, normal scan, no significant wall abnormalities noted   CARPAL TUNNEL RELEASE Right ~ 2005   CATARACT EXTRACTION, BILATERAL Bilateral 2000   CYSTOSCOPY  11/02/2021   Procedure: CYSTOSCOPY;  Surgeon: Lafonda Mosses, MD;  Location: [facility];  Service: Gynecology;;   DILATION AND CURETTAGE OF UTERUS  1950's   DOPPLER ECHOCARDIOGRAPHY  12/07/11   EYE SURGERY     FEMUR IM NAIL Right 08/01/2015   Procedure: INTRAMEDULLARY (IM) NAIL FEMORAL;  Surgeon: Marchia Bond, MD;  Location: [place];  Service: Orthopedics;  Laterality: Right;   HARDWARE REMOVAL Right 03/16/2016   Procedure: HARDWARE REMOVAL;  Surgeon: Marchia Bond, MD;  Location: [place];  Service: Orthopedics;  Laterality: Right;   INTRAMEDULLARY (IM) NAIL INTERTROCHANTERIC Left 01/26/2017   Procedure: INTRAMEDULLARY (IM) NAIL INTERTROCHANTRIC;  Surgeon: Leandrew Koyanagi, MD;  Location: [place];  Service: Orthopedics;  Laterality: Left;   JOINT REPLACEMENT     bilateral knee replacement   ROBOTIC ASSISTED BILATERAL SALPINGO OOPHERECTOMY Bilateral 11/02/2021   Procedure: XI ROBOTIC ASSISTED BILATERAL SALPINGO OOPHORECTOMY;  Surgeon: Lafonda Mosses, MD;  [facility];  Service: Gynecology;  Laterality: Bilateral;   ROBOTIC ASSISTED TOTAL HYSTERECTOMY N/A 11/02/2021   Procedure: XI ROBOTIC ASSISTED TOTAL HYSTERECTOMY WITH  MINI LAPAROTOMY;  Surgeon: Lafonda Mosses, MD;  [facility];  Service: Gynecology;  Laterality: N/A;   SKIN CANCER EXCISION  2000's   "off my nose" (08/13/2013)   TOTAL HIP ARTHROPLASTY Right 03/16/2016   Procedure: TOTAL HIP ARTHROPLASTY;  Surgeon: Marchia Bond, MD;  Location: [place];  Service: Orthopedics;  Laterality: Right;   TOTAL HIP ARTHROPLASTY Left 06/09/2017   Procedure: REMOVE LEFT HIP INTRAMEDULLARY NAIL AND CONVERT TO LEFT CEMENTED  HEMI HIP ARTHROPLASTY;  Surgeon: Leandrew Koyanagi, MD;  Location: [place];  Service: Orthopedics;  Laterality: Left;   TOTAL KNEE ARTHROPLASTY Right 2005   TOTAL KNEE ARTHROPLASTY  03/02/2012   Procedure: TOTAL KNEE ARTHROPLASTY;  Surgeon: Yvette Rack., MD;  Location: [place];  Service: Orthopedics;  Laterality: Left;    Family History  Problem Relation Age of Onset   Arrhythmia Mother    Heart disease Mother    Stroke Father 52   Arrhythmia Sister    Heart disease Sister    Heart disease Sister    Stroke Sister    Diabetes Sister    Heart disease Brother    Heart disease Brother    Cancer Brother        Colon cancer   Arrhythmia Brother    Heart disease Brother    Heart disease Child 58   Heart disease Child 73   Breast cancer Neg Hx    Ovarian cancer Neg Hx    Endometrial cancer Neg Hx    Pancreatic cancer Neg Hx    Prostate cancer Neg Hx     Social History   Socioeconomic History   Marital status: Widowed    Spouse name: Not on file   Number of children: Not on file   Years of education: Not on file   Highest education level: Not  on file  Occupational History   Not on file  Tobacco Use   Smoking status: Never   Smokeless tobacco: Never  Vaping Use   Vaping Use: Never used  Substance and Sexual Activity   Alcohol use: No   Drug use: No   Sexual activity: Not Currently  Other Topics Concern   Not on file  Social History Narrative   Widowed. Lives at [facility]. Son died in the [place] War and she has not slept well since then.   Admitted to [facility] 01/30/17   Never smoked   Alcohol none   Full Code   Social Determinants of Health   Financial Resource Strain: Not on file  Food Insecurity: Not on file  Transportation Needs: Not on file  Physical Activity: Not on file  Stress: Not on file  Social Connections: Not on file    Current Medications:  Current Outpatient Medications:    acetaminophen (TYLENOL) 325 MG tablet, Take 650 mg by  mouth every 6 (six) hours as needed for moderate pain. , Disp: , Rfl:    chlorthalidone (HYGROTON) 25 MG tablet, TAKE 1 TABLET ONCE DAILY., Disp: 90 tablet, Rfl: 3   Cholecalciferol 2000 units CAPS, Take 2,000 Units by mouth every evening., Disp: , Rfl:    denosumab (PROLIA) 60 MG/ML SOLN injection, Inject 60 mg into the skin every 6 (six) months. Administer in upper arm, thigh, or abdomen, Disp: , Rfl:    diltiazem (CARDIZEM CD) 180 MG 24 hr capsule, TAKE 1 CAPSULE DAILY., Disp: 90 capsule, Rfl: 2   DULoxetine HCl 40 MG CPEP, 1 capsule, Disp: , Rfl:    ELIQUIS 2.5 MG TABS tablet, TAKE 1 TABLET BY MOUTH TWICE DAILY., Disp: 180 tablet, Rfl: 1   Flaxseed, Linseed, (FLAXSEED OIL) 1000 MG CAPS, Take 1,000 mg by mouth 2 (two) times daily. , Disp: , Rfl:    furosemide (LASIX) 20 MG tablet, Take 20 mg by mouth daily as needed for fluid or edema., Disp: , Rfl:    levothyroxine (SYNTHROID) 112 MCG tablet, Take 112 mcg by mouth daily before breakfast. , Disp: , Rfl:    magnesium hydroxide (MILK OF MAGNESIA) 400 MG/5ML suspension, Take 30 mLs by mouth daily as needed for mild constipation., Disp: , Rfl:    metoprolol (LOPRESSOR) 50 MG tablet, Take 50 mg by mouth 2 (two) times daily., Disp: , Rfl:    Multiple Vitamins-Minerals (PRESERVISION AREDS 2+MULTI VIT PO), Take 1 capsule by mouth 2 (two) times daily., Disp: , Rfl:    potassium chloride (KLOR-CON) 10 MEQ tablet, Take 10 mEq by mouth as needed (when taking lasix)., Disp: , Rfl:    senna (SENOKOT) 8.6 MG tablet, Take 2 tablets by mouth at bedtime., Disp: , Rfl:    sulfamethoxazole-trimethoprim (BACTRIM) 400-80 MG tablet, Take 1 tablet by mouth 2 (two) times daily., Disp: 60 tablet, Rfl: 11   temazepam (RESTORIL) 30 MG capsule, Take 1 capsule (30 mg total) by mouth at bedtime as needed for sleep., Disp: 30 capsule, Rfl: 0   traMADol (ULTRAM) 50 MG tablet, Take 1 tablet (50 mg total) by mouth every 6 (six) hours as needed for severe pain. Do not take and  drive, Disp: 10 tablet, Rfl: 0  Review of Symptoms: Pertinent positives as per HPI.  Physical Exam: There were no vitals taken for this visit. Deferred given limitations of phone visit.  Laboratory & Radiologic Studies: None new  Assessment & Plan: Leah Holland is a 88  y.o. woman with incompletely staged deeply myoinvasive uterine high-grade carcinoma with clear cell features presenting for surveillance.   The patient is overall doing well without significant change in her symptoms.  Continues to have prominent fatigue.  We discussed that this could be related to her cancer, cardiac history, age, or combination of factors.  From a quality-of-life standpoint, she continues to be very bothered by her urinary incontinence.  We have previously talked about referral to urology or urogynecology to discuss possible use of a pessary.  She is scheduled for a visit in July.    In terms of follow-up, we discussed continuing with visits every 3 months.  I given her fatigue and difficulty coming in person, we have transitioned to follow-up visits every 3 months by phone.  We will plan to have a phone visit at 4 PM on September 15.  I discussed the assessment and treatment plan with the patient. The patient was provided with an opportunity to ask questions and all were answered. The patient agreed with the plan and demonstrated an understanding of the instructions.   The patient was advised to call back or see an in-person evaluation if the symptoms worsen or if the condition fails to improve as anticipated.   16 minutes of total time was spent for this patient encounter, including preparation, phone counseling with the patient and coordination of care, and documentation of the encounter.   [name], MD  Division of Gynecologic Oncology  Department of Obstetrics and Gynecology  [facility]

## 2022-06-15 DIAGNOSIS — F329 Major depressive disorder, single episode, unspecified: Secondary | ICD-10-CM | POA: Diagnosis not present

## 2022-06-15 DIAGNOSIS — E785 Hyperlipidemia, unspecified: Secondary | ICD-10-CM | POA: Diagnosis not present

## 2022-06-15 DIAGNOSIS — I4891 Unspecified atrial fibrillation: Secondary | ICD-10-CM | POA: Diagnosis not present

## 2022-06-15 DIAGNOSIS — M810 Age-related osteoporosis without current pathological fracture: Secondary | ICD-10-CM | POA: Diagnosis not present

## 2022-06-15 DIAGNOSIS — I10 Essential (primary) hypertension: Secondary | ICD-10-CM | POA: Diagnosis not present

## 2022-06-15 DIAGNOSIS — E039 Hypothyroidism, unspecified: Secondary | ICD-10-CM | POA: Diagnosis not present

## 2022-06-15 DIAGNOSIS — G4700 Insomnia, unspecified: Secondary | ICD-10-CM | POA: Diagnosis not present

## 2022-06-21 DIAGNOSIS — L988 Other specified disorders of the skin and subcutaneous tissue: Secondary | ICD-10-CM | POA: Diagnosis not present

## 2022-06-21 DIAGNOSIS — L570 Actinic keratosis: Secondary | ICD-10-CM | POA: Diagnosis not present

## 2022-06-21 DIAGNOSIS — L814 Other melanin hyperpigmentation: Secondary | ICD-10-CM | POA: Diagnosis not present

## 2022-06-21 DIAGNOSIS — L821 Other seborrheic keratosis: Secondary | ICD-10-CM | POA: Diagnosis not present

## 2022-06-27 DIAGNOSIS — Z79899 Other long term (current) drug therapy: Secondary | ICD-10-CM | POA: Diagnosis not present

## 2022-06-27 DIAGNOSIS — C541 Malignant neoplasm of endometrium: Secondary | ICD-10-CM | POA: Diagnosis not present

## 2022-06-27 DIAGNOSIS — E039 Hypothyroidism, unspecified: Secondary | ICD-10-CM | POA: Diagnosis not present

## 2022-06-27 DIAGNOSIS — Z7901 Long term (current) use of anticoagulants: Secondary | ICD-10-CM | POA: Diagnosis not present

## 2022-06-27 DIAGNOSIS — I4891 Unspecified atrial fibrillation: Secondary | ICD-10-CM | POA: Diagnosis not present

## 2022-06-27 DIAGNOSIS — I10 Essential (primary) hypertension: Secondary | ICD-10-CM | POA: Diagnosis not present

## 2022-06-27 DIAGNOSIS — E785 Hyperlipidemia, unspecified: Secondary | ICD-10-CM | POA: Diagnosis not present

## 2022-07-05 ENCOUNTER — Ambulatory Visit (INDEPENDENT_AMBULATORY_CARE_PROVIDER_SITE_OTHER): Payer: Medicare Other | Admitting: Obstetrics and Gynecology

## 2022-07-05 ENCOUNTER — Encounter: Payer: Self-pay | Admitting: Obstetrics and Gynecology

## 2022-07-05 VITALS — BP 160/84 | HR 67 | Ht 60.0 in | Wt 127.0 lb

## 2022-07-05 DIAGNOSIS — R3915 Urgency of urination: Secondary | ICD-10-CM | POA: Diagnosis not present

## 2022-07-05 DIAGNOSIS — N393 Stress incontinence (female) (male): Secondary | ICD-10-CM | POA: Diagnosis not present

## 2022-07-05 LAB — POCT URINALYSIS DIPSTICK
Appearance: NORMAL
Bilirubin, UA: NEGATIVE
Blood, UA: NEGATIVE
Glucose, UA: NEGATIVE
Ketones, UA: NEGATIVE
Leukocytes, UA: NEGATIVE
Nitrite, UA: NEGATIVE
Protein, UA: NEGATIVE
Spec Grav, UA: 1.02 (ref 1.010–1.025)
Urobilinogen, UA: 1 E.U./dL
pH, UA: 7.5 (ref 5.0–8.0)

## 2022-07-05 NOTE — Progress Notes (Signed)
[facility] New Patient Evaluation and Consultation  Referring Provider: Lafonda Mosses, MD PCP: [name], MD (Inactive) Date of Service: 07/05/2022  SUBJECTIVE Chief Complaint: New Patient (Initial Visit)  History of Present Illness: Leah Holland is a 88 y.o. White or Caucasian female seen in consultation at the request of Dr. Berline Lopes for evaluation of incontinence.    Review of records from Dr Berline Lopes significant for: Urinary incontinence is mostly stress. Has done physical therapy.   Has a history of endometrial cancer s/p robotic hysterectomy Nov 2022. Final pathology reveals poorly differentiated carcinoma with clear cell features of the uterus, extends to subserosal myometrium.  Depth of invasion 95%, LVI not identified.  Cervix negative for carcinoma.  Adnexa negative for carcinoma.  Urinary Symptoms: Leaks urine with cough/ sneeze, laughing, and going from sitting to standing- has a gush Leaks "a lot", about 4-5 times per day Pad use: 2 liners/ mini-pads per day.   She is bothered by her UI symptoms.  Day time voids- every few hours.  Nocturia: 1 times per night to void. Voiding dysfunction: she does not empty her bladder well.  does not use a catheter to empty bladder.  When urinating, she feels she has no difficulties  UTIs:  0  UTI's in the last year.   Denies history of blood in urine and kidney or bladder stones  Pelvic Organ Prolapse Symptoms:                  She Denies a feeling of a bulge the vaginal area.   Bowel Symptom: Bowel movements: 1 time(s) per day Stool consistency: soft  Straining: no.  Splinting: no.  Incomplete evacuation: no.  She Denies accidental bowel leakage / fecal incontinence Bowel regimen: stool softener  Sexual Function Sexually active: no.   Pelvic Pain Denies pelvic pain   Past Medical History:  Past Medical History:  Diagnosis Date   Anxiety attack    "take RX prn" (08/13/2013)   Arthritis     "probably" (08/13/2013)   Atrial fibrillation ([place])    new onset/pt report 08/13/2013   Bladder wall thickening 07/01/2021   CT scan in Epic   Bronchitis 12/2011   Cholelithiasis 07/01/2021   CT in Epic.   Common bile duct dilation 07/01/2021   CT in Epic:  13 mm.   Complication of anesthesia    Compression fracture of first lumbar vertebra ([place]) 07/01/2021   CT in Epic.   Depression    Femur fracture (HCC)    left proximal femur non-union   GERD (gastroesophageal reflux disease)    H/O hiatal hernia    Hardware complicating wound infection ([place]) 08/22/2016   History of blood transfusion    "w/both knee OR's" (08/13/2013)   Hyperlipidemia    Hyperlipidemia    Hypertension    Hypertensive urgency 10/22/2018   Hypothyroidism    takes synthroid   Insomnia    Intertrochanteric fracture of right hip ([place]) 07/30/2015   Neuromuscular disorder (HCC)    carpal tunnel in right hand   Periprosthetic fracture around internal prosthetic right hip joint ([place]), nonunion basicervical femoral neck with broken hardware and subtrochanteric fracture 03/16/2016   PONV (postoperative nausea and vomiting)    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 Gwenlyn Found, cardiologist 1 x year [phone]   Tendon dysfunction 08/22/2016   Wears hearing aid    [facility]     Past Surgical History:   Past Surgical History:  Procedure Laterality Date   2D ECHOCARDIOGRAM  12/07/2004   EF 48%, moderate pulmonary hypertension   BREAST BIOPSY Left ?1970's   CARDIOVASCULAR STRESS TEST  12/07/11   CARDIOVASCULAR STRESS TEST  12/07/2011   LEXISCAN, normal scan, no significant wall abnormalities noted   CARPAL TUNNEL RELEASE Right ~ 2005   CATARACT EXTRACTION, BILATERAL Bilateral 2000   CYSTOSCOPY  11/02/2021    Procedure: CYSTOSCOPY;  Surgeon: Lafonda Mosses, MD;  [facility];  Service: Gynecology;;   DILATION AND CURETTAGE OF UTERUS  1950's   DOPPLER ECHOCARDIOGRAPHY  12/07/11   EYE SURGERY     FEMUR IM NAIL Right 08/01/2015   Procedure: INTRAMEDULLARY (IM) NAIL FEMORAL;  Surgeon: Marchia Bond, MD;  Location: [place];  Service: Orthopedics;  Laterality: Right;   HARDWARE REMOVAL Right 03/16/2016   Procedure: HARDWARE REMOVAL;  Surgeon: Marchia Bond, MD;  Location: [place];  Service: Orthopedics;  Laterality: Right;   INTRAMEDULLARY (IM) NAIL INTERTROCHANTERIC Left 01/26/2017   Procedure: INTRAMEDULLARY (IM) NAIL INTERTROCHANTRIC;  Surgeon: Leandrew Koyanagi, MD;  Location: [place];  Service: Orthopedics;  Laterality: Left;   JOINT REPLACEMENT     bilateral knee replacement   ROBOTIC ASSISTED BILATERAL SALPINGO OOPHERECTOMY Bilateral 11/02/2021   Procedure: XI ROBOTIC ASSISTED BILATERAL SALPINGO OOPHORECTOMY;  Surgeon: Lafonda Mosses, MD;  [facility];  Service: Gynecology;  Laterality: Bilateral;   ROBOTIC ASSISTED TOTAL HYSTERECTOMY N/A 11/02/2021   Procedure: XI ROBOTIC ASSISTED TOTAL HYSTERECTOMY WITH  MINI LAPAROTOMY;  Surgeon: Lafonda Mosses, MD;  [facility];  Service: Gynecology;  Laterality: N/A;   SKIN CANCER EXCISION  2000's   "off my nose" (08/13/2013)   TOTAL HIP ARTHROPLASTY Right 03/16/2016   Procedure: TOTAL HIP ARTHROPLASTY;  Surgeon: Marchia Bond, MD;  Location: [place];  Service: Orthopedics;  Laterality: Right;   TOTAL HIP ARTHROPLASTY Left 06/09/2017   Procedure: REMOVE LEFT HIP INTRAMEDULLARY NAIL AND CONVERT TO LEFT CEMENTED HEMI HIP ARTHROPLASTY;  Surgeon: Leandrew Koyanagi, MD;  Location: [place];  Service: Orthopedics;  Laterality: Left;   TOTAL KNEE ARTHROPLASTY Right 2005   TOTAL KNEE ARTHROPLASTY  03/02/2012   Procedure: TOTAL KNEE ARTHROPLASTY;  Surgeon: Yvette Rack., MD;  Location: [place];  Service: Orthopedics;  Laterality: Left;     Past OB/GYN History: OB  History  Gravida Para Term Preterm AB Living  '3 3 3     2  '$ SAB IAB Ectopic Multiple Live Births          3    # Outcome Date GA Lbr Len/2nd Weight Sex Delivery Anes PTL Lv  3 Term           2 Term           1 Term             Vaginal deliveries: 3 S/p hysterectomy   Medications: She has a current medication list which includes the following prescription(s): acetaminophen, chlorthalidone, cholecalciferol, denosumab, diltiazem, duloxetine hcl, eliquis, flaxseed oil, furosemide, levothyroxine, metoprolol tartrate, sulfamethoxazole-trimethoprim, temazepam, magnesium hydroxide, multiple vitamins-minerals, potassium chloride, senna, and tramadol.   Allergies: Patient is allergic to levaquin [levofloxacin], morphine and related, and morphine sulfate.   Social History:  Social History   Tobacco Use   Smoking status:  Never   Smokeless tobacco: Never  Vaping Use   Vaping Use: Never used  Substance Use Topics   Alcohol use: No   Drug use: No    Relationship status: widowed She lives alone.   She is not employed. Regular exercise: No History of abuse: No  Family History:   Family History  Problem Relation Age of Onset   Arrhythmia Mother    Heart disease Mother    Stroke Father 56   Arrhythmia Sister    Heart disease Sister    Heart disease Sister    Stroke Sister    Diabetes Sister    Heart disease Brother    Heart disease Brother    Cancer Brother        Colon cancer   Arrhythmia Brother    Heart disease Brother    Heart disease Child 72   Heart disease Child 8   Breast cancer Neg Hx    Ovarian cancer Neg Hx    Endometrial cancer Neg Hx    Pancreatic cancer Neg Hx    Prostate cancer Neg Hx      Review of Systems: Review of Systems  Constitutional:  Negative for fever, malaise/fatigue and weight loss.  Respiratory:  Negative for cough, shortness of breath and wheezing.   Cardiovascular:  Positive for leg swelling. Negative for chest pain and palpitations.   Gastrointestinal:  Negative for abdominal pain and blood in stool.  Genitourinary:  Negative for dysuria.  Musculoskeletal:  Negative for myalgias.  Skin:  Negative for rash.  Neurological:  Negative for dizziness and headaches.  Endo/Heme/Allergies:  Bruises/bleeds easily.  Psychiatric/Behavioral:  Negative for depression. The patient is not nervous/anxious.      OBJECTIVE Physical Exam: Vitals:   07/05/22 1022  BP: (!) 160/84  Pulse: 67  Weight: 127 lb (57.6 kg)  Height: 5' (1.524 m)    Physical Exam Constitutional:      General: She is not in acute distress. Pulmonary:     Effort: Pulmonary effort is normal.  Abdominal:     General: There is no distension.     Palpations: Abdomen is soft.     Tenderness: There is no abdominal tenderness. There is no rebound.  Musculoskeletal:        General: No swelling. Normal range of motion.  Skin:    General: Skin is warm and dry.     Findings: No rash.  Neurological:     Mental Status: She is alert and oriented to person, place, and time.  Psychiatric:        Mood and Affect: Mood normal.        Behavior: Behavior normal.      GU / Detailed Urogynecologic Evaluation:  Pelvic Exam: Normal external female genitalia; Bartholin's and Skene's glands normal in appearance; urethral meatus normal in appearance, no urethral masses or discharge.   CST: negative   s/p hysterectomy: Speculum exam reveals normal vaginal mucosa with  atrophy and normal vaginal cuff.  Adnexa no mass, fullness, tenderness.    Pelvic floor strength I/V  Pelvic floor musculature: Right levator non-tender, Right obturator non-tender, Left levator non-tender, Left obturator non-tender  POP-Q:   POP-Q  -3                                            Aa   -3  Ba  -6                                              C   1.5                                            Gh  2.5                                             Pb  6                                            tvl   -3                                            Ap  -3                                            Bp                                                 D   Attempted to place a #1 incontinence ring but this did not fit.   Rectal Exam:  Normal external rectum  Post-Void Residual (PVR) by Bladder Scan: In order to evaluate bladder emptying, we discussed obtaining a postvoid residual and she agreed to this procedure.  Procedure: The ultrasound unit was placed on the patient's abdomen in the suprapubic region after the patient had voided. A PVR of 0 ml was obtained by bladder scan.  Laboratory Results: POC urine: negative   ASSESSMENT AND PLAN Ms. Finken is a 88 y.o. with:  1. SUI (stress urinary incontinence, female)   2. Urinary urgency     - For treatment of stress urinary incontinence,  non-surgical options include expectant management,  physical therapy, as well as a pessary.  She is not surgery.  - Attempted to place a #1 incontinence ring pessary but this was too large and we do not currently have the #0 ring in stock. Will need to order and have her return for a fitting.  - Discussed that when we find a pessary, then she will need to come q3 months for pessary cleanings.   Will call her for follow up when the pessary arrives.    Jaquita Folds, MD

## 2022-07-05 NOTE — Patient Instructions (Signed)
For treatment of stress urinary incontinence, which is leakage with physical activity/movement/strainging/coughing, we discussed expectant management versus nonsurgical options versus surgery. Nonsurgical options include physical therapy, as well as a pessary.  Surgical options are also available.

## 2022-07-19 DIAGNOSIS — D485 Neoplasm of uncertain behavior of skin: Secondary | ICD-10-CM | POA: Diagnosis not present

## 2022-07-19 DIAGNOSIS — L988 Other specified disorders of the skin and subcutaneous tissue: Secondary | ICD-10-CM | POA: Diagnosis not present

## 2022-07-19 DIAGNOSIS — L570 Actinic keratosis: Secondary | ICD-10-CM | POA: Diagnosis not present

## 2022-07-19 DIAGNOSIS — L820 Inflamed seborrheic keratosis: Secondary | ICD-10-CM | POA: Diagnosis not present

## 2022-07-27 DIAGNOSIS — H353131 Nonexudative age-related macular degeneration, bilateral, early dry stage: Secondary | ICD-10-CM | POA: Diagnosis not present

## 2022-07-27 DIAGNOSIS — H04123 Dry eye syndrome of bilateral lacrimal glands: Secondary | ICD-10-CM | POA: Diagnosis not present

## 2022-07-27 DIAGNOSIS — H35372 Puckering of macula, left eye: Secondary | ICD-10-CM | POA: Diagnosis not present

## 2022-08-05 ENCOUNTER — Telehealth: Payer: Self-pay | Admitting: Obstetrics and Gynecology

## 2022-08-08 ENCOUNTER — Telehealth: Payer: Self-pay | Admitting: *Deleted

## 2022-08-08 ENCOUNTER — Other Ambulatory Visit: Payer: Self-pay | Admitting: Gynecologic Oncology

## 2022-08-08 DIAGNOSIS — R1013 Epigastric pain: Secondary | ICD-10-CM

## 2022-08-08 DIAGNOSIS — R1033 Periumbilical pain: Secondary | ICD-10-CM

## 2022-08-08 DIAGNOSIS — C541 Malignant neoplasm of endometrium: Secondary | ICD-10-CM

## 2022-08-08 NOTE — Progress Notes (Signed)
See CMA note. Patient calling the office with new abdominal pain at the umbilicus. Tender to the touch. 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 Plan to abdominal ultrasound to start work up.

## 2022-08-08 NOTE — Telephone Encounter (Signed)
Patient's son called and stated "Mom had surgery about a year ago with Dr Berline Lopes. After surgery she decided she did not want to have any treatment. Mom started have some pain over the weekend between her naval and pelvis and the pain is constant; pretty much in the same place as before. She has been using a rotation of advil and tylenol. It has helped some, but when she wants to go to the doctor we know she is hurting. Nothing makes it better or worse and she has no changes in her bladder or bowels. She is not bloated or having the feeling of fullness."   Explained that Dr Berline Lopes is out of the office today but the message would be given to [facility] APP and the office would call him back. Patient is willing to come in the office if needed. Patient also requested that we call Client.

## 2022-08-08 NOTE — Telephone Encounter (Signed)
Per Joylene John NP, STAT abdominal ultrasound has been ordered.   Per radiology, pt has to be NPO 6 hours before scan, d/t time this afternoon pt is scheduled for tomorrow 8/29 @ 8:30, arrive at 8:00 NPO after midnight. Pt and son, [name], agrees to date and time.  Joylene John NP notified.

## 2022-08-08 NOTE — Telephone Encounter (Signed)
Pt son Patrick Jupiter was called back and has been scheduled for pessary placement

## 2022-08-08 NOTE — Telephone Encounter (Signed)
I spoke to pt to get more information regarding her abdominal pain.   Pt  states the pain is right at the naval, there is not a knot or anything that she can feel. The pain rate is 8/10. She did take Ultram, which helps a little. She states no N/V/abd bloating or distension, no vaginal bleeding or dysuria/hematuria. No fever/chills. Bowels are moving normal and she is passing gas.   Aware I will notify Joylene John NP, and call her back with a plan. She states an understanding.  I also spoke to her son, [name]. He would like for [place] to call him once we have a plan on the next step.

## 2022-08-09 ENCOUNTER — Inpatient Hospital Stay: Payer: Medicare Other | Attending: Gynecologic Oncology | Admitting: Gynecologic Oncology

## 2022-08-09 ENCOUNTER — Telehealth: Payer: Self-pay

## 2022-08-09 ENCOUNTER — Ambulatory Visit (HOSPITAL_COMMUNITY)
Admission: RE | Admit: 2022-08-09 | Discharge: 2022-08-09 | Disposition: A | Discharge status: Home / Self Care | Payer: Medicare Other | Source: Ambulatory Visit | Attending: Gynecologic Oncology | Admitting: Gynecologic Oncology

## 2022-08-09 ENCOUNTER — Encounter: Payer: Self-pay | Admitting: Gynecologic Oncology

## 2022-08-09 DIAGNOSIS — C541 Malignant neoplasm of endometrium: Secondary | ICD-10-CM | POA: Diagnosis not present

## 2022-08-09 DIAGNOSIS — N281 Cyst of kidney, acquired: Secondary | ICD-10-CM | POA: Diagnosis not present

## 2022-08-09 DIAGNOSIS — R1013 Epigastric pain: Secondary | ICD-10-CM | POA: Diagnosis not present

## 2022-08-09 DIAGNOSIS — R1033 Periumbilical pain: Secondary | ICD-10-CM | POA: Diagnosis not present

## 2022-08-09 DIAGNOSIS — K8020 Calculus of gallbladder without cholecystitis without obstruction: Secondary | ICD-10-CM | POA: Diagnosis not present

## 2022-08-09 NOTE — Telephone Encounter (Signed)
I spoke to pt today to follow up from the call yesterday. Pt states she just got home from her ultrasound. She states the pain has lessened. It is only tender at the bellybutton. She states she has been feeling dizzy for several days. I advised her to stay hydrated and make sure she is eating small meals throughout the day so her sugar won't drop. She states an understanding.   Pt aware, per Joylene John NP, she is scheduled for a phone visit today at 4:20 to speak to Dr.Tucker. Pt asked if I would call her sons and let them know as well. Leah Holland and both sons agreed to time of phone visit today.

## 2022-08-09 NOTE — Progress Notes (Signed)
Gynecologic Oncology Telehealth Note: Gyn-Onc  I connected with Leah Holland on 08/09/22 at  4:20 PM EDT by telephone and verified that I am speaking with the correct person using two identifiers.  I discussed the limitations, risks, security and privacy concerns of performing an evaluation and management service by telemedicine and the availability of in-person appointments. I also discussed with the patient that there may be a patient responsible charge related to this service. The patient expressed understanding and agreed to proceed.  Other persons participating in the visit and their role in the encounter: none.  Patient's location: home Provider's location: [facility]  Reason for Visit: Abdominal pain  Treatment History: 06/2021: Patient seen initially for urinary incontinence. 07/01/2021: CT scan. 09/09/2021: Pelvic ultrasound shows enlarged uterus with internal mass and cystic/solid components.  Bilateral ovaries not identified, no adnexal masses appreciated. 09/24/2021: MRI A/P shows thick-walled cystic lesion in the midline pelvis, which appears to arise from the right ovary, measures 8.5 x 7.5 x 8.1 cm.  Findings highly concerning for cystic ovarian neoplasm.  No adenopathy or metastatic disease definitively noted. 11/02/2021: Robotic total hysterectomy with BSO, mini lap for specimen removal, cystoscopy.  Findings at surgery included enlarged uterus with pyometra, atrophic adnexa, retroperitoneal fibrosis and edema, significant diverticular disease, powder burn lesions on the uterus and pelvic peritoneum consistent with possible endometriosis. 11/02/2021: Final pathology reveals poorly differentiated carcinoma with clear cell features of the uterus, extends to subserosal myometrium.  Depth of invasion 95%, LVI not identified.  Cervix negative for carcinoma.  Adnexa negative for carcinoma.  Interval History: Last 2-3 months worsening pain. She had been using advil and tylenol, would  relieve symptoms. Recently, has had increased pain right posterior to umbilicus. Describes an ache, no sharp pain. Had similar symptoms before surgery with me.  Reports normal bowel function with senna, has worked on increasing her fluid. Endorses good appetite, denies nausea or emesis. Has an appointment on Friday to get a pessary.   Past Medical/Surgical History: Past Medical History:  Diagnosis Date   Anxiety attack    "take RX prn" (08/13/2013)   Arthritis    "probably" (08/13/2013)   Atrial fibrillation ([place])    new onset/pt report 08/13/2013   Bladder wall thickening 07/01/2021   CT scan in Epic   Bronchitis 12/2011   Cholelithiasis 07/01/2021   CT in Epic.   Common bile duct dilation 07/01/2021   CT in Epic:  13 mm.   Complication of anesthesia    Compression fracture of first lumbar vertebra ([place]) 07/01/2021   CT in Epic.   Depression    Femur fracture (H[facility])    left proximal femur non-union   GERD (gastroesophageal reflux disease)    H/O hiatal hernia    Hardware complicating wound infection ([place]) 08/22/2016   History of blood transfusion    "w/both knee OR's" (08/13/2013)   Hyperlipidemia    Hyperlipidemia    Hypertension    Hypertensive urgency 10/22/2018   Hypothyroidism    takes synthroid   Insomnia    Intertrochanteric fracture of right hip ([place]) 07/30/2015   Neuromuscular disorder ([place])    carpal tunnel in right hand   Periprosthetic fracture around internal prosthetic right hip joint ([place]), nonunion basicervical femoral neck with broken hardware and subtrochanteric fracture 03/16/2016   PONV (postoperative nausea and vomiting)    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 Gwenlyn Found, cardiologist 1 x year [phone]   Tendon dysfunction 08/22/2016   Wears  hearing aid    [facility]    Past Surgical History:  Procedure Laterality Date   2D ECHOCARDIOGRAM  12/07/2004   EF 48%, moderate pulmonary hypertension   BREAST BIOPSY Left ?1970's   CARDIOVASCULAR STRESS TEST  12/07/11   CARDIOVASCULAR STRESS TEST  12/07/2011   LEXISCAN, normal scan, no significant wall abnormalities noted   CARPAL TUNNEL RELEASE Right ~ 2005   CATARACT EXTRACTION, BILATERAL Bilateral 2000   CYSTOSCOPY  11/02/2021   Procedure: CYSTOSCOPY;  Surgeon: Lafonda Mosses, MD;  Location: [facility];  Service: Gynecology;;   DILATION AND CURETTAGE OF UTERUS  1950's   DOPPLER ECHOCARDIOGRAPHY  12/07/11   EYE SURGERY     FEMUR IM NAIL Right 08/01/2015   Procedure: INTRAMEDULLARY (IM) NAIL FEMORAL;  Surgeon: Marchia Bond, MD;  Location: [place];  Service: Orthopedics;  Laterality: Right;   HARDWARE REMOVAL Right 03/16/2016   Procedure: HARDWARE REMOVAL;  Surgeon: Marchia Bond, MD;  Location: [place];  Service: Orthopedics;  Laterality: Right;   INTRAMEDULLARY (IM) NAIL INTERTROCHANTERIC Left 01/26/2017   Procedure: INTRAMEDULLARY (IM) NAIL INTERTROCHANTRIC;  Surgeon: Leandrew Koyanagi, MD;  Location: [place];  Service: Orthopedics;  Laterality: Left;   JOINT REPLACEMENT     bilateral knee replacement   ROBOTIC ASSISTED BILATERAL SALPINGO OOPHERECTOMY Bilateral 11/02/2021   Procedure: XI ROBOTIC ASSISTED BILATERAL SALPINGO OOPHORECTOMY;  Surgeon: Lafonda Mosses, MD;  Location: [facility];  Service: Gynecology;  Laterality: Bilateral;   ROBOTIC ASSISTED TOTAL HYSTERECTOMY N/A 11/02/2021   Procedure: XI ROBOTIC ASSISTED TOTAL HYSTERECTOMY WITH  MINI LAPAROTOMY;  Surgeon: Lafonda Mosses, MD;  Location[facility];  Service: Gynecology;  Laterality: N/A;   SKIN CANCER EXCISION  2000's   "off my nose" (08/13/2013)   TOTAL HIP ARTHROPLASTY Right 03/16/2016   Procedure: TOTAL HIP ARTHROPLASTY;  Surgeon: Marchia Bond, MD;  Location: [place];  Service: Orthopedics;  Laterality: Right;   TOTAL HIP  ARTHROPLASTY Left 06/09/2017   Procedure: REMOVE LEFT HIP INTRAMEDULLARY NAIL AND CONVERT TO LEFT CEMENTED HEMI HIP ARTHROPLASTY;  Surgeon: Leandrew Koyanagi, MD;  Location: [place];  Service: Orthopedics;  Laterality: Left;   TOTAL KNEE ARTHROPLASTY Right 2005   TOTAL KNEE ARTHROPLASTY  03/02/2012   Procedure: TOTAL KNEE ARTHROPLASTY;  Surgeon: Yvette Rack., MD;  Location: [place];  Service: Orthopedics;  Laterality: Left;    Family History  Problem Relation Age of Onset   Arrhythmia Mother    Heart disease Mother    Stroke Father 75   Arrhythmia Sister    Heart disease Sister    Heart disease Sister    Stroke Sister    Diabetes Sister    Heart disease Brother    Heart disease Brother    Cancer Brother        Colon cancer   Arrhythmia Brother    Heart disease Brother    Heart disease Child 64   Heart disease Child 62   Breast cancer Neg Hx    Ovarian cancer Neg Hx    Endometrial cancer Neg Hx    Pancreatic cancer Neg Hx    Prostate cancer Neg Hx     Social History   Socioeconomic History   Marital status: Widowed    Spouse name: Not on file  Number of children: Not on file   Years of education: Not on file   Highest education level: Not on file  Occupational History   Not on file  Tobacco Use   Smoking status: Never   Smokeless tobacco: Never  Vaping Use   Vaping Use: Never used  Substance and Sexual Activity   Alcohol use: No   Drug use: No   Sexual activity: Not Currently  Other Topics Concern   Not on file  Social History Narrative   Widowed. Lives at [facility]. Son died in the [place] War and she has not slept well since then.   Admitted to [facility] 01/30/17   Never smoked   Alcohol none   Full Code   Social Determinants of Health   Financial Resource Strain: Not on file  Food Insecurity: Not on file  Transportation Needs: Not on file  Physical Activity: Not on file  Stress: Not on file  Social Connections: Not on file     Current Medications:  Current Outpatient Medications:    acetaminophen (TYLENOL) 325 MG tablet, Take 650 mg by mouth every 6 (six) hours as needed for moderate pain. , Disp: , Rfl:    chlorthalidone (HYGROTON) 25 MG tablet, TAKE 1 TABLET ONCE DAILY., Disp: 90 tablet, Rfl: 3   Cholecalciferol 2000 units CAPS, Take 2,000 Units by mouth every evening., Disp: , Rfl:    denosumab (PROLIA) 60 MG/ML SOLN injection, Inject 60 mg into the skin every 6 (six) months. Administer in upper arm, thigh, or abdomen, Disp: , Rfl:    diltiazem (CARDIZEM CD) 180 MG 24 hr capsule, TAKE 1 CAPSULE DAILY., Disp: 90 capsule, Rfl: 2   DULoxetine HCl 40 MG CPEP, , Disp: , Rfl:    ELIQUIS 2.5 MG TABS tablet, TAKE 1 TABLET BY MOUTH TWICE DAILY., Disp: 180 tablet, Rfl: 1   Flaxseed, Linseed, (FLAXSEED OIL) 1000 MG CAPS, Take 1,000 mg by mouth 2 (two) times daily. , Disp: , Rfl:    furosemide (LASIX) 20 MG tablet, Take 20 mg by mouth daily as needed for fluid or edema. (Patient not taking: Reported on 08/08/2022), Disp: , Rfl:    levothyroxine (SYNTHROID) 112 MCG tablet, Take 112 mcg by mouth daily before breakfast. , Disp: , Rfl:    magnesium hydroxide (MILK OF MAGNESIA) 400 MG/5ML suspension, Take 30 mLs by mouth daily as needed for mild constipation., Disp: , Rfl:    metoprolol (LOPRESSOR) 50 MG tablet, Take 50 mg by mouth 2 (two) times daily., Disp: , Rfl:    Multiple Vitamins-Minerals (PRESERVISION AREDS 2+MULTI VIT PO), Take 1 capsule by mouth 2 (two) times daily., Disp: , Rfl:    potassium chloride (KLOR-CON) 10 MEQ tablet, Take 10 mEq by mouth as needed (when taking lasix). (Patient not taking: Reported on 07/05/2022), Disp: , Rfl:    senna (SENOKOT) 8.6 MG tablet, Take 2 tablets by mouth at bedtime., Disp: , Rfl:    sulfamethoxazole-trimethoprim (BACTRIM) 400-80 MG tablet, Take 1 tablet by mouth 2 (two) times daily., Disp: 60 tablet, Rfl: 11   temazepam (RESTORIL) 30 MG capsule, Take 1 capsule (30 mg total) by  mouth at bedtime as needed for sleep., Disp: 30 capsule, Rfl: 0   traMADol (ULTRAM) 50 MG tablet, Take 1 tablet (50 mg total) by mouth every 6 (six) hours as needed for severe pain. Do not take and drive, Disp: 10 tablet, Rfl: 0  Review of Symptoms: Pertinent positives as per HPI.  Physical Exam:  Deferred given limitations of phone visit.  Laboratory & Radiologic Studies: US abdomen: IMPRESSION: Cholelithiasis.  No sonographic evidence of acute cholecystitis.   Stable dilatation of the common bile duct, evaluated by prior MRI.   No evidence of ventral wall hernia.  Assessment & Plan: Leah Holland is a 88 y.o. woman with incompletely staged deeply myoinvasive uterine high-grade carcinoma with clear cell features presenting for surveillance.  Discussed recent worsening of umbilical pain.  While there is some fluid near the umbilicus, no hernia or other obvious reason for her pain.  I worry somewhat given her description of the pain as being similar to prior to her surgery.  Discussed options moving forward including getting a CT scan to better evaluate the abdomen and pelvis.  The patient continues to endorse not being interested in cancer directed therapy such as chemotherapy.  I think it is reasonable to get additional imaging because if she has evidence of recurrent cancer, this will help in treatment planning (such as a discussion regarding hospice).  At the end of our conversation, the patient would like to get a CT scan.  This was ordered and I have asked the office to help schedule it.  I discussed the assessment and treatment plan with the patient. The patient was provided with an opportunity to ask questions and all were answered. The patient agreed with the plan and demonstrated an understanding of the instructions.   The patient was advised to call back or see an in-person evaluation if the symptoms worsen or if the condition fails to improve as anticipated.   12 minutes of  total time was spent for this patient encounter, including preparation, phone counseling with the patient and coordination of care, and documentation of the encounter.   [name], MD  Division of Gynecologic Oncology  Department of Obstetrics and Gynecology  [facility]

## 2022-08-10 ENCOUNTER — Telehealth: Payer: Self-pay | Admitting: *Deleted

## 2022-08-10 NOTE — Telephone Encounter (Signed)
Per Dr Berline Lopes scheduled the patient for a CT scan on 9/11 at 12 pm. Patient to pick up contrast tomorrow from the front desk.

## 2022-08-12 ENCOUNTER — Ambulatory Visit (INDEPENDENT_AMBULATORY_CARE_PROVIDER_SITE_OTHER): Payer: Medicare Other | Admitting: Obstetrics and Gynecology

## 2022-08-12 ENCOUNTER — Encounter: Payer: Self-pay | Admitting: Obstetrics and Gynecology

## 2022-08-12 VITALS — BP 175/92 | HR 97

## 2022-08-12 DIAGNOSIS — N393 Stress incontinence (female) (male): Secondary | ICD-10-CM

## 2022-08-12 NOTE — Progress Notes (Signed)
[facility]   Subjective:     Chief Complaint: No chief complaint on file.  History of Present Illness: Leah Holland is a 88 y.o. female with stress incontinence who presents today for a pessary fitting.    Past Medical History: Patient  has a past medical history of Anxiety attack, Arthritis, Atrial fibrillation ([place]), Bladder wall thickening (07/01/2021), Bronchitis (12/2011), Cholelithiasis (07/01/2021), Common bile duct dilation (59/74/1638), Complication of anesthesia, Compression fracture of first lumbar vertebra ([place]) (07/01/2021), Depression, Femur fracture ([place]), GERD (gastroesophageal reflux disease), H/O hiatal hernia, Hardware complicating wound infection ([place]) (08/22/2016), History of blood transfusion, Hyperlipidemia, Hyperlipidemia, Hypertension, Hypertensive urgency (10/22/2018), Hypothyroidism, Insomnia, Intertrochanteric fracture of right hip ([place]) (07/30/2015), Neuromuscular disorder ([place]), Periprosthetic fracture around internal prosthetic right hip joint ([place]), nonunion basicervical femoral neck with broken hardware and subtrochanteric fracture (03/16/2016), PONV (postoperative nausea and vomiting), Prosthetic hip infection ([place]) (08/22/2016), Proteus infection (08/22/2016), Serratia infection (11/14/2016), Shingles, Skin cancer of face (2000's), Swelling of extremity, Tendon dysfunction (08/22/2016), and Wears hearing aid.   Past Surgical History: She  has a past surgical history that includes Total knee arthroplasty (Right, 2005); Carpal tunnel release (Right, ~ 2005); Eye surgery; Breast biopsy (Left, ?1970's); Cardiovascular stress test (12/07/11); doppler echocardiography (12/07/11); Total knee arthroplasty (03/02/2012); Joint replacement; Cataract extraction, bilateral (Bilateral, 2000); Skin cancer excision (2000's); Dilation and curettage of uterus (1950's); Cardiovascular stress test (12/07/2011); 2D ECHOCARDIOGRAM (12/07/2004); Femur IM nail (Right,  08/01/2015); Total hip arthroplasty (Right, 03/16/2016); Hardware Removal (Right, 03/16/2016); Intramedullary (im) nail intertrochanteric (Left, 01/26/2017); Total hip arthroplasty (Left, 06/09/2017); Robotic assisted bilateral salpingo oophorectomy (Bilateral, 11/02/2021); Robotic assisted total hysterectomy (N/A, 11/02/2021); and Cystoscopy (11/02/2021).   Medications: She has a current medication list which includes the following prescription(s): acetaminophen, chlorthalidone, cholecalciferol, denosumab, diltiazem, duloxetine hcl, eliquis, flaxseed oil, furosemide, levothyroxine, magnesium hydroxide, metoprolol tartrate, multiple vitamins-minerals, potassium chloride, senna, sulfamethoxazole-trimethoprim, temazepam, and tramadol.   Allergies: Patient is allergic to levaquin [levofloxacin], morphine and related, and morphine sulfate.   Social History: Patient  reports that she has never smoked. She has never used smokeless tobacco. She reports that she does not drink alcohol and does not use drugs.      Objective:    There were no vitals taken for this visit. Gen: No apparent distress, A&O x 3. Pelvic Exam: Normal external female genitalia; Bartholin's and Skene's glands normal in appearance; urethral meatus normal in appearance, no urethral masses or discharge.   A size #0 incontinence ring pessary was fitted. It was comfortable on initial placement but she felt it more with walking around and it bulged out with urination. Also tried a #0 ring without support but she had large leakage with cough with this in place. #1 incontinence ring and dish were also tried without success.  POP-Q (07/05/22):    POP-Q   -3                                            Aa   -3                                           Ba   -6  C    1.5                                            Gh   2.5                                            Pb   6                                             tvl    -3                                            Ap   -3                                            Bp                                                    Assessment/Plan:    Assessment: Leah Holland is a 88 y.o. with stress incontinence who presents for a pessary fitting.  Plan: - Unable to find a pessary to fit today- short vaginal length prohibited the smallest pessary from staying comfortably.  - Discussed potential option of urethral bulking and handout provided on this option. She will consider but wants to expectantly manage her symptoms at this time.    Return as needed  Jaquita Folds, MD

## 2022-08-17 ENCOUNTER — Telehealth: Payer: Self-pay

## 2022-08-17 NOTE — Telephone Encounter (Signed)
Pt left her key in the office during her last visit on 08/12/2022. Pt was accompanied by her son.  [name] was called and It was said the key would be picked up on 08/16/22. [facility] called and and asked the key be removed from her key fob and mailed directly to the patient. The key has been wrapped up and mailed to the address on file.

## 2022-08-22 ENCOUNTER — Encounter (HOSPITAL_COMMUNITY): Payer: Self-pay

## 2022-08-22 ENCOUNTER — Ambulatory Visit (HOSPITAL_COMMUNITY)
Admission: RE | Admit: 2022-08-22 | Discharge: 2022-08-22 | Disposition: A | Discharge status: Home / Self Care | Payer: Medicare Other | Source: Ambulatory Visit | Attending: Gynecologic Oncology | Admitting: Gynecologic Oncology

## 2022-08-22 DIAGNOSIS — R1033 Periumbilical pain: Secondary | ICD-10-CM | POA: Insufficient documentation

## 2022-08-22 DIAGNOSIS — C541 Malignant neoplasm of endometrium: Secondary | ICD-10-CM | POA: Diagnosis not present

## 2022-08-22 DIAGNOSIS — K769 Liver disease, unspecified: Secondary | ICD-10-CM | POA: Diagnosis not present

## 2022-08-22 DIAGNOSIS — K8020 Calculus of gallbladder without cholecystitis without obstruction: Secondary | ICD-10-CM | POA: Diagnosis not present

## 2022-08-22 DIAGNOSIS — E119 Type 2 diabetes mellitus without complications: Secondary | ICD-10-CM | POA: Insufficient documentation

## 2022-08-22 DIAGNOSIS — K5730 Diverticulosis of large intestine without perforation or abscess without bleeding: Secondary | ICD-10-CM | POA: Diagnosis not present

## 2022-08-22 LAB — POCT I-STAT CREATININE: Creatinine, Ser: 1.2 mg/dL — ABNORMAL HIGH (ref 0.44–1.00)

## 2022-08-22 MED ORDER — IOHEXOL 300 MG/ML  SOLN
100.0000 mL | Freq: Once | INTRAMUSCULAR | Status: AC | PRN
  Administered 2022-08-22: 75 mL via INTRAVENOUS

## 2022-08-22 MED ORDER — SODIUM CHLORIDE (PF) 0.9 % IJ SOLN
INTRAMUSCULAR | Status: AC
  Filled 2022-08-22: qty 50

## 2022-08-23 ENCOUNTER — Telehealth: Payer: Self-pay | Admitting: Gynecologic Oncology

## 2022-08-23 DIAGNOSIS — L820 Inflamed seborrheic keratosis: Secondary | ICD-10-CM | POA: Diagnosis not present

## 2022-08-23 DIAGNOSIS — D485 Neoplasm of uncertain behavior of skin: Secondary | ICD-10-CM | POA: Diagnosis not present

## 2022-08-23 DIAGNOSIS — L570 Actinic keratosis: Secondary | ICD-10-CM | POA: Diagnosis not present

## 2022-08-23 NOTE — Telephone Encounter (Signed)
Called the patient to discuss CT results.  Discussed that there is no evidence of recurrent cancer.  She asked that I speak with her son later in the week at our scheduled phone visit time.  [name] MD Gynecologic Oncology

## 2022-08-24 ENCOUNTER — Telehealth: Payer: Self-pay | Admitting: *Deleted

## 2022-08-24 NOTE — Telephone Encounter (Signed)
Patrick Jupiter (patient's son) called and wanted to confirm that his mother still has a phone appt on Friday 9/15.

## 2022-08-26 ENCOUNTER — Inpatient Hospital Stay: Payer: Medicare Other | Attending: Gynecologic Oncology | Admitting: Gynecologic Oncology

## 2022-08-26 ENCOUNTER — Encounter: Payer: Self-pay | Admitting: Gynecologic Oncology

## 2022-08-26 DIAGNOSIS — R1013 Epigastric pain: Secondary | ICD-10-CM | POA: Diagnosis not present

## 2022-08-26 DIAGNOSIS — Z8542 Personal history of malignant neoplasm of other parts of uterus: Secondary | ICD-10-CM | POA: Diagnosis not present

## 2022-08-26 DIAGNOSIS — C541 Malignant neoplasm of endometrium: Secondary | ICD-10-CM

## 2022-08-26 NOTE — Progress Notes (Signed)
Gynecologic Oncology Telehealth Note: Gyn-Onc  I connected with Leah Holland on 08/26/22 at  4:20 PM EDT by telephone and verified that I am speaking with the correct person using two identifiers.  I discussed the limitations, risks, security and privacy concerns of performing an evaluation and management service by telemedicine and the availability of in-person appointments. I also discussed with the patient that there may be a patient responsible charge related to this service. The patient expressed understanding and agreed to proceed.  Other persons participating in the visit and their role in the encounter: Patient's son, Leah Holland.  Patient's location: Home Provider's location: [facility]  Reason for Visit: Follow-up abdominal pain  Treatment History: 06/2021: Patient seen initially for urinary incontinence. 07/01/2021: CT scan. 09/09/2021: Pelvic ultrasound shows enlarged uterus with internal mass and cystic/solid components.  Bilateral ovaries not identified, no adnexal masses appreciated. 09/24/2021: MRI A/P shows thick-walled cystic lesion in the midline pelvis, which appears to arise from the right ovary, measures 8.5 x 7.5 x 8.1 cm.  Findings highly concerning for cystic ovarian neoplasm.  No adenopathy or metastatic disease definitively noted. 11/02/2021: Robotic total hysterectomy with BSO, mini lap for specimen removal, cystoscopy.  Findings at surgery included enlarged uterus with pyometra, atrophic adnexa, retroperitoneal fibrosis and edema, significant diverticular disease, powder burn lesions on the uterus and pelvic peritoneum consistent with possible endometriosis. 11/02/2021: Final pathology reveals poorly differentiated carcinoma with clear cell features of the uterus, extends to subserosal myometrium.  Depth of invasion 95%, LVI not identified.  Cervix negative for carcinoma.  Adnexa negative for carcinoma.  Interval History: Patient reports overall feeling much better  today.  She has been wearing her underwear and pants below her umbilicus and has had no pressure sensation or pain behind her umbilicus.  Past Medical/Surgical History: Past Medical History:  Diagnosis Date   Anxiety attack    "take RX prn" (08/13/2013)   Arthritis    "probably" (08/13/2013)   Atrial fibrillation ([place])    new onset/pt report 08/13/2013   Bladder wall thickening 07/01/2021   CT scan in Epic   Bronchitis 12/2011   Cholelithiasis 07/01/2021   CT in Epic.   Common bile duct dilation 07/01/2021   CT in Epic:  13 mm.   Complication of anesthesia    Compression fracture of first lumbar vertebra ([place]) 07/01/2021   CT in Epic.   Depression    Femur fracture (HCC)    left proximal femur non-union   GERD (gastroesophageal reflux disease)    H/O hiatal hernia    Hardware complicating wound infection ([place]) 08/22/2016   History of blood transfusion    "w/both knee OR's" (08/13/2013)   Hyperlipidemia    Hyperlipidemia    Hypertension    Hypertensive urgency 10/22/2018   Hypothyroidism    takes synthroid   Insomnia    Intertrochanteric fracture of right hip ([place]) 07/30/2015   Neuromuscular disorder (HCC)    carpal tunnel in right hand   Periprosthetic fracture around internal prosthetic right hip joint ([place]), nonunion basicervical femoral neck with broken hardware and subtrochanteric fracture 03/16/2016   PONV (postoperative nausea and vomiting)    Prosthetic hip infection ([place]) 08/22/2016   Proteus infection 08/22/2016   Serratia infection 11/14/2016   Shingles    Skin cancer of face 2000's   "one on each side of my face and one on my nose" (08/13/2013)   Swelling of extremity    sees Dr. Gwenlyn Found, cardiologist 1 x year [phone]   Tendon  dysfunction 08/22/2016   Wears hearing aid    [facility]    Past Surgical History:  Procedure Laterality Date   2D ECHOCARDIOGRAM  12/07/2004   EF 48%, moderate pulmonary hypertension   BREAST BIOPSY Left ?1970's   CARDIOVASCULAR STRESS  TEST  12/07/11   CARDIOVASCULAR STRESS TEST  12/07/2011   LEXISCAN, normal scan, no significant wall abnormalities noted   CARPAL TUNNEL RELEASE Right ~ 2005   CATARACT EXTRACTION, BILATERAL Bilateral 2000   CYSTOSCOPY  11/02/2021   Procedure: CYSTOSCOPY;  Surgeon: Lafonda Mosses, MD;  Location: [facility];  Service: Gynecology;;   DILATION AND CURETTAGE OF UTERUS  1950's   DOPPLER ECHOCARDIOGRAPHY  12/07/11   EYE SURGERY     FEMUR IM NAIL Right 08/01/2015   Procedure: INTRAMEDULLARY (IM) NAIL FEMORAL;  Surgeon: Marchia Bond, MD;  Location: [facility];  Service: Orthopedics;  Laterality: Right;   HARDWARE REMOVAL Right 03/16/2016   Procedure: HARDWARE REMOVAL;  Surgeon: Marchia Bond, MD;  Location: [facility];  Service: Orthopedics;  Laterality: Right;   INTRAMEDULLARY (IM) NAIL INTERTROCHANTERIC Left 01/26/2017   Procedure: INTRAMEDULLARY (IM) NAIL INTERTROCHANTRIC;  Surgeon: Leandrew Koyanagi, MD;  Location: [facility];  Service: Orthopedics;  Laterality: Left;   JOINT REPLACEMENT     bilateral knee replacement   ROBOTIC ASSISTED BILATERAL SALPINGO OOPHERECTOMY Bilateral 11/02/2021   Procedure: XI ROBOTIC ASSISTED BILATERAL SALPINGO OOPHORECTOMY;  Surgeon: Lafonda Mosses, MD;  Location[facility];  Service: Gynecology;  Laterality: Bilateral;   ROBOTIC ASSISTED TOTAL HYSTERECTOMY N/A 11/02/2021   Procedure: XI ROBOTIC ASSISTED TOTAL HYSTERECTOMY WITH  MINI LAPAROTOMY;  Surgeon: Lafonda Mosses, MD;  Location[facility];  Service: Gynecology;  Laterality: N/A;   SKIN CANCER EXCISION  2000's   "off my nose" (08/13/2013)   TOTAL HIP ARTHROPLASTY Right 03/16/2016   Procedure: TOTAL HIP ARTHROPLASTY;  Surgeon: Marchia Bond, MD;  Location: [facility];  Service: Orthopedics;  Laterality: Right;   TOTAL HIP ARTHROPLASTY Left 06/09/2017   Procedure: REMOVE LEFT HIP INTRAMEDULLARY NAIL AND CONVERT TO LEFT CEMENTED HEMI HIP ARTHROPLASTY;  Surgeon: Leandrew Koyanagi, MD;  Location: [facility];  Service: Orthopedics;  Laterality: Left;    TOTAL KNEE ARTHROPLASTY Right 2005   TOTAL KNEE ARTHROPLASTY  03/02/2012   Procedure: TOTAL KNEE ARTHROPLASTY;  Surgeon: Yvette Rack., MD;  Location: [place];  Service: Orthopedics;  Laterality: Left;    Family History  Problem Relation Age of Onset   Arrhythmia Mother    Heart disease Mother    Stroke Father 77   Arrhythmia Sister    Heart disease Sister    Heart disease Sister    Stroke Sister    Diabetes Sister    Heart disease Brother    Heart disease Brother    Cancer Brother        Colon cancer   Arrhythmia Brother    Heart disease Brother    Heart disease Child 10   Heart disease Child 37   Breast cancer Neg Hx    Ovarian cancer Neg Hx    Endometrial cancer Neg Hx    Pancreatic cancer Neg Hx    Prostate cancer Neg Hx     Social History   Socioeconomic History   Marital status: Widowed    Spouse name: Not on file   Number of children: Not on file   Years of education: Not on file   Highest education level: Not on file  Occupational History   Not on file  Tobacco Use   Smoking status: Never   Smokeless tobacco: Never  Vaping Use   Vaping Use: Never used  Substance and Sexual Activity   Alcohol use: No   Drug use: No   Sexual activity: Not Currently  Other Topics Concern   Not on file  Social History Narrative   Widowed. Lives at [facility]. Son died in the [place] War and she has not slept well since then.   Admitted to [facility] 01/30/17   Never smoked   Alcohol none   Full Code   Social Determinants of Health   Financial Resource Strain: Not on file  Food Insecurity: Not on file  Transportation Needs: Not on file  Physical Activity: Not on file  Stress: Not on file  Social Connections: Not on file    Current Medications:  Current Outpatient Medications:    acetaminophen (TYLENOL) 325 MG tablet, Take 650 mg by mouth every 6 (six) hours as needed for moderate pain. , Disp: , Rfl:    chlorthalidone (HYGROTON) 25 MG  tablet, TAKE 1 TABLET ONCE DAILY., Disp: 90 tablet, Rfl: 3   Cholecalciferol 2000 units CAPS, Take 2,000 Units by mouth every evening., Disp: , Rfl:    denosumab (PROLIA) 60 MG/ML SOLN injection, Inject 60 mg into the skin every 6 (six) months. Administer in upper arm, thigh, or abdomen, Disp: , Rfl:    diltiazem (CARDIZEM CD) 180 MG 24 hr capsule, TAKE 1 CAPSULE DAILY., Disp: 90 capsule, Rfl: 2   DULoxetine HCl 40 MG CPEP, , Disp: , Rfl:    ELIQUIS 2.5 MG TABS tablet, TAKE 1 TABLET BY MOUTH TWICE DAILY., Disp: 180 tablet, Rfl: 1   Flaxseed, Linseed, (FLAXSEED OIL) 1000 MG CAPS, Take 1,000 mg by mouth 2 (two) times daily. , Disp: , Rfl:    furosemide (LASIX) 20 MG tablet, Take 20 mg by mouth daily as needed for fluid or edema. (Patient not taking: Reported on 08/08/2022), Disp: , Rfl:    levothyroxine (SYNTHROID) 112 MCG tablet, Take 112 mcg by mouth daily before breakfast. , Disp: , Rfl:    magnesium hydroxide (MILK OF MAGNESIA) 400 MG/5ML suspension, Take 30 mLs by mouth daily as needed for mild constipation., Disp: , Rfl:    metoprolol (LOPRESSOR) 50 MG tablet, Take 50 mg by mouth 2 (two) times daily., Disp: , Rfl:    Multiple Vitamins-Minerals (PRESERVISION AREDS 2+MULTI VIT PO), Take 1 capsule by mouth 2 (two) times daily., Disp: , Rfl:    potassium chloride (KLOR-CON) 10 MEQ tablet, Take 10 mEq by mouth as needed (when taking lasix). (Patient not taking: Reported on 07/05/2022), Disp: , Rfl:    senna (SENOKOT) 8.6 MG tablet, Take 2 tablets by mouth at bedtime., Disp: , Rfl:    sulfamethoxazole-trimethoprim (BACTRIM) 400-80 MG tablet, Take 1 tablet by mouth 2 (two) times daily., Disp: 60 tablet, Rfl: 11   temazepam (RESTORIL) 30 MG capsule, Take 1 capsule (30 mg total) by mouth at bedtime as needed for sleep., Disp: 30 capsule, Rfl: 0   traMADol (ULTRAM) 50 MG tablet, Take 1 tablet (50 mg total) by mouth every 6 (six) hours as needed for severe pain. Do not take and drive, Disp: 10 tablet, Rfl:  0  Review of Symptoms: Pertinent positives as per HPI.  Physical Exam: Deferred given limitations of phone visit.  Laboratory & Radiologic Studies: CT A/P on 9/11: 1. No evidence of metastatic disease in the abdomen or pelvis. Evaluation of the pelvis is  significantly limited by streak artifact from bilateral hip hardware. 2. No acute abnormality.  No evidence of a ventral hernia. 3. Chronic findings include: Cholelithiasis. Chronic mild common bile duct dilation 8 mm diameter), not appreciably changed. (Moderate left colonic diverticulosis. Chronic severe L1 vertebral compression fracture. Borderline mild cardiomegaly. Aortic Atherosclerosis (ICD10-I70.0).  Assessment & Plan: Leah Holland is a 88 y.o. woman with incompletely staged deeply myoinvasive uterine high-grade carcinoma with clear cell features presenting for phone visit after recent imaging.  Patient was having significant periumbilical pain.  Ultrasound performed with no identifiable source.  CT scan performed.  Notable for increased amount of stool in the colon, otherwise no evidence of metastatic disease.  Patient seems to have identified that her underwear and pants were causing the symptoms.  Patient and her son had questions again about follow-up.  They would like to come in for a visit in 3 months in person.  We had previously discussed that in the setting of her fatigue that we would do phone follow-up visits every 3 months.   I discussed the assessment and treatment plan with the patient. The patient was provided with an opportunity to ask questions and all were answered. The patient agreed with the plan and demonstrated an understanding of the instructions.   The patient was advised to call back or see an in-person evaluation if the symptoms worsen or if the condition fails to improve as anticipated.   12 minutes of total time was spent for this patient encounter, including preparation, phone counseling with  the patient and coordination of care, and documentation of the encounter.   [name], MD  Division of Gynecologic Oncology  Department of Obstetrics and Gynecology  [facility]

## 2022-09-01 DIAGNOSIS — H353132 Nonexudative age-related macular degeneration, bilateral, intermediate dry stage: Secondary | ICD-10-CM | POA: Diagnosis not present

## 2022-09-01 DIAGNOSIS — H04123 Dry eye syndrome of bilateral lacrimal glands: Secondary | ICD-10-CM | POA: Diagnosis not present

## 2022-09-19 DIAGNOSIS — Z822 Family history of deafness and hearing loss: Secondary | ICD-10-CM | POA: Diagnosis not present

## 2022-09-19 DIAGNOSIS — E039 Hypothyroidism, unspecified: Secondary | ICD-10-CM | POA: Diagnosis not present

## 2022-09-19 DIAGNOSIS — Z77122 Contact with and (suspected) exposure to noise: Secondary | ICD-10-CM | POA: Diagnosis not present

## 2022-09-19 DIAGNOSIS — H903 Sensorineural hearing loss, bilateral: Secondary | ICD-10-CM | POA: Diagnosis not present

## 2022-09-19 DIAGNOSIS — H9313 Tinnitus, bilateral: Secondary | ICD-10-CM | POA: Diagnosis not present

## 2022-09-20 DIAGNOSIS — L820 Inflamed seborrheic keratosis: Secondary | ICD-10-CM | POA: Diagnosis not present

## 2022-09-20 DIAGNOSIS — L821 Other seborrheic keratosis: Secondary | ICD-10-CM | POA: Diagnosis not present

## 2022-09-20 DIAGNOSIS — L814 Other melanin hyperpigmentation: Secondary | ICD-10-CM | POA: Diagnosis not present

## 2022-09-20 DIAGNOSIS — L570 Actinic keratosis: Secondary | ICD-10-CM | POA: Diagnosis not present

## 2022-09-27 DIAGNOSIS — Z23 Encounter for immunization: Secondary | ICD-10-CM | POA: Diagnosis not present

## 2022-10-13 DIAGNOSIS — Z23 Encounter for immunization: Secondary | ICD-10-CM | POA: Diagnosis not present

## 2022-10-18 DIAGNOSIS — Z6822 Body mass index (BMI) 22.0-22.9, adult: Secondary | ICD-10-CM | POA: Diagnosis not present

## 2022-10-18 DIAGNOSIS — D6869 Other thrombophilia: Secondary | ICD-10-CM | POA: Diagnosis not present

## 2022-10-18 DIAGNOSIS — M810 Age-related osteoporosis without current pathological fracture: Secondary | ICD-10-CM | POA: Diagnosis not present

## 2022-10-18 DIAGNOSIS — G4700 Insomnia, unspecified: Secondary | ICD-10-CM | POA: Diagnosis not present

## 2022-10-18 DIAGNOSIS — I10 Essential (primary) hypertension: Secondary | ICD-10-CM | POA: Diagnosis not present

## 2022-10-18 DIAGNOSIS — I4891 Unspecified atrial fibrillation: Secondary | ICD-10-CM | POA: Diagnosis not present

## 2022-10-18 DIAGNOSIS — K219 Gastro-esophageal reflux disease without esophagitis: Secondary | ICD-10-CM | POA: Diagnosis not present

## 2022-10-18 DIAGNOSIS — Z7901 Long term (current) use of anticoagulants: Secondary | ICD-10-CM | POA: Diagnosis not present

## 2022-10-18 DIAGNOSIS — R2681 Unsteadiness on feet: Secondary | ICD-10-CM | POA: Diagnosis not present

## 2022-10-18 DIAGNOSIS — E039 Hypothyroidism, unspecified: Secondary | ICD-10-CM | POA: Diagnosis not present

## 2022-10-18 DIAGNOSIS — D509 Iron deficiency anemia, unspecified: Secondary | ICD-10-CM | POA: Diagnosis not present

## 2022-10-18 DIAGNOSIS — E785 Hyperlipidemia, unspecified: Secondary | ICD-10-CM | POA: Diagnosis not present

## 2022-10-26 ENCOUNTER — Other Ambulatory Visit: Payer: Self-pay | Admitting: Cardiovascular Disease

## 2022-11-15 ENCOUNTER — Non-Acute Institutional Stay: Payer: Medicare Other | Admitting: Family Medicine

## 2022-11-15 ENCOUNTER — Encounter: Payer: Self-pay | Admitting: Family Medicine

## 2022-11-15 VITALS — BP 130/60 | HR 62 | Temp 98.8°F | Resp 20

## 2022-11-15 DIAGNOSIS — R143 Flatulence: Secondary | ICD-10-CM | POA: Diagnosis not present

## 2022-11-15 DIAGNOSIS — Z7901 Long term (current) use of anticoagulants: Secondary | ICD-10-CM

## 2022-11-15 DIAGNOSIS — I480 Paroxysmal atrial fibrillation: Secondary | ICD-10-CM | POA: Diagnosis not present

## 2022-11-15 DIAGNOSIS — G3184 Mild cognitive impairment, so stated: Secondary | ICD-10-CM

## 2022-11-15 NOTE — Progress Notes (Unsigned)
[facility] Consult Note Telephone: [phone]  Fax: [phone]    Date of encounter: 11/15/22 11:37 AM PATIENT NAME: Leah Holland [address] [place] [address]   [phone] (home) [phone] (work) DOB: 1922-09-18 MRN: [id number] PRIMARY CARE PROVIDER:    [name], MD (Inactive),  No address on file None  REFERRING PROVIDER:   No referring provider defined for this encounter. N/A  RESPONSIBLE PARTY:    Contact Information     Name Relation Home Work Mobile   [place] Son [phone]  [phone]   Cherrell, Maybee [phone]  [phone]        I met face to face with patient and was later joined by son Naysa [place] in [place]'s  [place]. Palliative Care was asked to follow this patient by consultation request of  No ref. provider found to address advance care planning and complex medical decision making. This is a follow up visit   ASSESSMENT , SYMPTOM MANAGEMENT AND PLAN / RECOMMENDATIONS:   Advance Care Planning/Goals of Care: Goals include to maximize quality of life and symptom management. The value and importance of advance care planning-has a living will stating that If my attending physician determines I lack capacity to make or communicate health care decisions then it applies.  Life prolonging treatment can be withheld or withdrawn:  if I have an incurable or irreversible condition which will result in death in a short period of time; if I become unconscious and my health care providers determine to a high degree of medical certainty I will not regain consciousness or if I suffer from advanced dementia or other condition which results in substantial loss of cognitive ability and health care provider determine with a high degree of medical certainty the loss is irreversible. She does not want artificial nutrition or hydration. She has given direction to follow the  advance directive and override the health care agent. Experiences with loved ones who have been seriously ill or have died  Exploration of personal, cultural or spiritual beliefs that might influence medical decisions  Exploration of goals of care in the event of a sudden injury or illness  Identification of a healthcare agent  Review and updating or creation of an  advance directive document . Decision not to resuscitate or to de-escalate disease focused treatments due to poor prognosis. CODE STATUS:      Follow up Palliative Care Visit: Palliative care will continue to follow for complex medical decision making, advance care planning, and clarification of goals. Return *** weeks or prn.    This visit was coded based on medical decision making (MDM).  PPS: ***0%  HOSPICE ELIGIBILITY/DIAGNOSIS: TBD  Chief Complaint: ***  HISTORY OF PRESENT ILLNESS:  [name] is a 88 y.o. year old female with *** .  Not eating as well.  Sees Dr Berline Lopes next month.  Has good and bad days, more fatigue and having trouble remembering some things which she has not had trouble before.  States her weight is about 125 lbs.  Having a lot of gas.  Denies bloating, nausea and vomiting.  Denies falls. Denies vaginal bleeding or blood in stool. Takes Sennokot 2 tabs nightly.  Sleeping ok with sleeping pill.  Wears hearing aids.  History obtained from review of EMR, discussion with primary team, and interview with family, facility staff/caregiver and/or [name].  I reviewed EMR for available labs, medications, imaging, studies and related documents.  There  are no new records since last visit/Records reviewed and summarized above.   ROS General: NAD EYES: denies vision changes ENMT: denies dysphagia Cardiovascular: denies chest pain, denies DOE Pulmonary: denies cough, denies increased SOB Abdomen: endorses good appetite, denies constipation, endorses continence of bowel GU: denies dysuria, endorses  continence of urine MSK:  denies increased weakness,  no falls reported Skin: denies rashes or wounds Neurological: denies pain, denies insomnia Psych: Endorses positive mood Heme/lymph/immuno: denies bruises, abnormal bleeding  Physical Exam: Current and past weights: Constitutional: NAD General: frail appearing, thin/WNWD/obese  EYES: anicteric sclera, lids intact, no discharge  ENMT: intact hearing, oral mucous membranes moist, dentition intact CV: S1S2, RRR, no LE edema Pulmonary: LCTA, no increased work of breathing, no cough, room air Abdomen: intake 100%, normo-active BS + 4 quadrants, soft and non tender, no ascites GU: deferred MSK: no sarcopenia, moves all extremities, ambulatory Skin: warm and dry, no rashes or wounds on visible skin Neuro:  no generalized weakness,  no cognitive impairment Psych: non-anxious affect, A and O x 3 Hem/lymph/immuno: no widespread bruising   Thank you for the opportunity to participate in the care of [name].  The palliative care team will continue to follow. Please call our office at [phone] if we can be of additional assistance.   Marijo Conception, FNP -C  COVID-19 PATIENT SCREENING TOOL Asked and negative response unless otherwise noted:   Have you had symptoms of covid, tested positive or been in contact with someone with symptoms/positive test in the past 5-10 days?

## 2022-11-16 ENCOUNTER — Encounter: Payer: Self-pay | Admitting: Family Medicine

## 2022-11-16 DIAGNOSIS — G3184 Mild cognitive impairment, so stated: Secondary | ICD-10-CM | POA: Insufficient documentation

## 2022-11-16 DIAGNOSIS — R143 Flatulence: Secondary | ICD-10-CM | POA: Insufficient documentation

## 2022-11-22 ENCOUNTER — Encounter: Payer: Self-pay | Admitting: Cardiovascular Disease

## 2022-11-22 ENCOUNTER — Ambulatory Visit: Payer: Medicare Other | Attending: Cardiovascular Disease | Admitting: Cardiovascular Disease

## 2022-11-22 VITALS — BP 110/80 | HR 65 | Ht 63.0 in | Wt 124.0 lb

## 2022-11-22 DIAGNOSIS — I4821 Permanent atrial fibrillation: Secondary | ICD-10-CM | POA: Insufficient documentation

## 2022-11-22 DIAGNOSIS — I480 Paroxysmal atrial fibrillation: Secondary | ICD-10-CM | POA: Insufficient documentation

## 2022-11-22 DIAGNOSIS — E782 Mixed hyperlipidemia: Secondary | ICD-10-CM | POA: Diagnosis not present

## 2022-11-22 DIAGNOSIS — I10 Essential (primary) hypertension: Secondary | ICD-10-CM | POA: Diagnosis not present

## 2022-11-22 NOTE — Progress Notes (Signed)
11/22/2022 [name]   06/17/1922  [id number]  Primary Physician [name], MD (Inactive) Primary Cardiologist: Lorretta Harp MD Garret Reddish, [place], [place]  HPI:  Leah Holland is a 88 y.o.  widowed, Caucasian female, mother of two living children (one deceased), grandmother to five grandchildren, who I last saw 02/12/2021.Marland Kitchen She is the youngest of 10 children. Her sister, Leah Holland was also placed in mind that has passed away. She had one son who died , one son named Leah Holland who is now a retired Lexicographer , who accompanies her today,  She has a history of hypertension and hyperlipidemia, as well as family history of heart disease. She had a remote right total knee replacement and a recent left one by Dr. French Ana. She had a Myoview stress test performed in our office on August 07, 2011, which was low risk. Recent lab work performed by Dr. Greta Doom revealed a total cholesterol of 133, HDL of 71, and HDL of 39.  Patient presented with apparent new onset atrial fibrillation with RVR. [facility]/2013 for chest pain. She was diagnosed with gastroesophageal reflux disease and a UTI. At that time she was in normal sinus rhythm at a rate of 79 beats per minute and with PACs. She reported dizziness, feeling tired and a racing HR since ~Sat/Sun. She otherwise denied nausea, vomiting, fever, chest pain, dizziness, PND, cough, congestion, abdominal pain, hematochezia, melena, lower extremity edema, claudication. No recent GIB.  She was admitted and started on IV diltiazem '5mg'$ /hr and Eliquis 2.'5mg'$  twice daily. She converted to NSR at 0246hrs. She was changed to PO Cardizem '120mg'$  daily. She's been taking lasix '60mg'$  daily for LEE for a long time. This was changed to '20mg'$  daily as needed a long with 10mq as needed potassium.    Her major issues had been with recurrent right and left hip fractures requiring surgical intervention.  She has  recuperated over the last year and is currently walking with a walker.    Her son who is the retired pediatrician feels that she may have an element of orthostatic hypotension which has led to her falls in the past and was recommended that she sit for a while before she stands up.   Since I saw her a year ago she is remained stable.    [facility]  She walks with a walker.  She does have some mild orthostasis but gets up slowly.  She denies chest pain or shortness of breath.   No outpatient medications have been marked as taking for the 11/22/22 encounter (Office Visit) with BLorretta Harp MD.     Allergies  Allergen Reactions   Levaquin [Levofloxacin] Other (See Comments)    tendinopathy   Morphine And Related Other (See Comments)    Made pt "wild"   Morphine Sulfate     Other reaction(s): made pt wild    Social History   Socioeconomic History   Marital status: Widowed    Spouse name: Not on file   Number of children: Not on file   Years of education: Not on file   Highest education level: Not on file  Occupational History   Not on file  Tobacco Use   Smoking status: Never   Smokeless tobacco: Never  Vaping Use   Vaping Use: Never used  Substance and Sexual Activity   Alcohol use: No   Drug use: No  Sexual activity: Not Currently  Other Topics Concern   Not on file  Social History Narrative   Widowed. Lives at [facility]. Son died in the [place] War and she has not slept well since then.   Admitted to [facility] 01/30/17   Never smoked   Alcohol none   Full Code   Social Determinants of Health   Financial Resource Strain: Not on file  Food Insecurity: Not on file  Transportation Needs: Not on file  Physical Activity: Not on file  Stress: Not on file  Social Connections: Not on file  Intimate Partner Violence: Not on file     Review of Systems: General: negative for chills, fever, night sweats or weight changes.   Cardiovascular: negative for chest pain, dyspnea on exertion, edema, orthopnea, palpitations, paroxysmal nocturnal dyspnea or shortness of breath Dermatological: negative for rash Respiratory: negative for cough or wheezing Urologic: negative for hematuria Abdominal: negative for nausea, vomiting, diarrhea, bright red blood per rectum, melena, or hematemesis Neurologic: negative for visual changes, syncope, or dizziness All other systems reviewed and are otherwise negative except as noted above.    Blood pressure 110/80, pulse 65, height '5\' 3"'$  (1.6 m), weight 124 lb (56.2 kg).  General appearance: alert and no distress Neck: no adenopathy, no carotid bruit, no JVD, supple, symmetrical, trachea midline, and thyroid not enlarged, symmetric, no tenderness/mass/nodules Lungs: clear to auscultation bilaterally Heart: irregularly irregular rhythm Extremities: extremities normal, atraumatic, no cyanosis or edema Pulses: 2+ and symmetric Skin: Skin color, texture, turgor normal. No rashes or lesions Neurologic: Grossly normal  EKG atrial fibrillation with ventricular sponsor 65 and septal Q waves.  I personally reviewed this EKG.  ASSESSMENT AND PLAN:   Hypertension History of essential hypertension a blood pressure measured today at 110/80.  She is on chlorthalidone, and diltiazem as well as metoprolol.  Hyperlipidemia History of hyperlipidemia not on statin therapy with lipid profile performed 07/01/2019 revealing total cholesterol 185, LDL 129 and HDL of 43.  Paroxysmal atrial fibrillation (HCC) History of persistent A-fib rate controlled on Eliquis oral anticoagulation.     Lorretta Harp MD FACP,FACC,FAHA, [facility] 11/22/2022 2:23 PM

## 2022-11-22 NOTE — Assessment & Plan Note (Signed)
History of persistent A-fib rate controlled on Eliquis oral anticoagulation. 

## 2022-11-22 NOTE — Patient Instructions (Signed)
Medication Instructions:  Your physician recommends that you continue on your current medications as directed. Please refer to the Current Medication list given to you today.  *If you need a refill on your cardiac medications before your next appointment, please call your pharmacy*   Follow-Up: At [facility], you and your health needs are our priority.  As part of our continuing mission to provide you with exceptional heart care, we have created designated Provider Care Teams.  These Care Teams include your primary Cardiologist (physician) and Advanced Practice Providers (APPs -  Physician Assistants and Nurse Practitioners) who all work together to provide you with the care you need, when you need it.  We recommend signing up for the patient portal called "MyChart".  Sign up information is provided on this After Visit Summary.  MyChart is used to connect with patients for Virtual Visits (Telemedicine).  Patients are able to view lab/test results, encounter notes, upcoming appointments, etc.  Non-urgent messages can be sent to your provider as well.   To learn more about what you can do with MyChart, go to [email or web address].    Your next appointment:   12 month(s)  The format for your next appointment:   In Person  Provider:   Jonathan Berry, MD   

## 2022-11-22 NOTE — Assessment & Plan Note (Signed)
History of hyperlipidemia not on statin therapy with lipid profile performed 07/01/2019 revealing total cholesterol 185, LDL 129 and HDL of 43.

## 2022-11-22 NOTE — Assessment & Plan Note (Signed)
History of essential hypertension a blood pressure measured today at 110/80.  She is on chlorthalidone, and diltiazem as well as metoprolol.

## 2022-11-25 ENCOUNTER — Encounter: Payer: Self-pay | Admitting: Gynecologic Oncology

## 2022-11-25 ENCOUNTER — Inpatient Hospital Stay: Payer: Medicare Other | Attending: Gynecologic Oncology | Admitting: Gynecologic Oncology

## 2022-11-25 ENCOUNTER — Other Ambulatory Visit: Payer: Self-pay

## 2022-11-25 VITALS — BP 153/73 | HR 63 | Temp 98.0°F | Resp 18 | Ht 63.0 in | Wt 125.0 lb

## 2022-11-25 DIAGNOSIS — Z8542 Personal history of malignant neoplasm of other parts of uterus: Secondary | ICD-10-CM

## 2022-11-25 DIAGNOSIS — Z90710 Acquired absence of both cervix and uterus: Secondary | ICD-10-CM | POA: Insufficient documentation

## 2022-11-25 DIAGNOSIS — C541 Malignant neoplasm of endometrium: Secondary | ICD-10-CM

## 2022-11-25 DIAGNOSIS — Z90722 Acquired absence of ovaries, bilateral: Secondary | ICD-10-CM | POA: Diagnosis not present

## 2022-11-25 NOTE — Progress Notes (Signed)
Gynecologic Oncology Return Clinic Visit  11/25/22  Reason for Visit: surveillance  Treatment History: 06/2021: Patient seen initially for urinary incontinence. 07/01/2021: CT scan. 09/09/2021: Pelvic ultrasound shows enlarged uterus with internal mass and cystic/solid components.  Bilateral ovaries not identified, no adnexal masses appreciated. 09/24/2021: MRI A/P shows thick-walled cystic lesion in the midline pelvis, which appears to arise from the right ovary, measures 8.5 x 7.5 x 8.1 cm.  Findings highly concerning for cystic ovarian neoplasm.  No adenopathy or metastatic disease definitively noted. 11/02/2021: Robotic total hysterectomy with BSO, mini lap for specimen removal, cystoscopy.  Findings at surgery included enlarged uterus with pyometra, atrophic adnexa, retroperitoneal fibrosis and edema, significant diverticular disease, powder burn lesions on the uterus and pelvic peritoneum consistent with possible endometriosis. 11/02/2021: Final pathology reveals poorly differentiated carcinoma with clear cell features of the uterus, extends to subserosal myometrium.  Depth of invasion 95%, LVI not identified.  Cervix negative for carcinoma.  Adnexa negative for carcinoma.  Interval History: Feels well.  Has some intermittent low back pain, at baseline.  Denies any constipation, sometimes feels like she is not emptying her bowels completely.  Has been using MiraLAX more recently for this.  Denies having hard stool.  Denies any vaginal bleeding or discharge.  Denies abdominal or pelvic pain.  Past Medical/Surgical History: Past Medical History:  Diagnosis Date   Anxiety attack    "take RX prn" (08/13/2013)   Arthritis    "probably" (08/13/2013)   Atrial fibrillation ([place])    new onset/pt report 08/13/2013   Bladder wall thickening 07/01/2021   CT scan in Epic   Bronchitis 12/2011   Cholelithiasis 07/01/2021   CT in Epic.   Common bile duct dilation 07/01/2021   CT in Epic:  13 mm.    Complication of anesthesia    Compression fracture of first lumbar vertebra ([place]) 07/01/2021   CT in Epic.   Depression    Femur fracture ([facility])    left proximal femur non-union   GERD (gastroesophageal reflux disease)    H/O hiatal hernia    Hardware complicating wound infection ([place]) 08/22/2016   History of blood transfusion    "w/both knee OR's" (08/13/2013)   Hyperlipidemia    Hyperlipidemia    Hypertension    Hypertensive urgency 10/22/2018   Hypothyroidism    takes synthroid   Insomnia    Intertrochanteric fracture of right hip ([place]) 07/30/2015   Neuromuscular disorder ([facility])    carpal tunnel in right hand   Periprosthetic fracture around internal prosthetic right hip joint ([place]), nonunion basicervical femoral neck with broken hardware and subtrochanteric fracture 03/16/2016   PONV (postoperative nausea and vomiting)    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 Gwenlyn Found, cardiologist 1 x year [phone]   Tendon dysfunction 08/22/2016   Wears hearing aid    [facility]    Past Surgical History:  Procedure Laterality Date   2D ECHOCARDIOGRAM  12/07/2004   EF 48%, moderate pulmonary hypertension   BREAST BIOPSY Left ?1970's   CARDIOVASCULAR STRESS TEST  12/07/11   CARDIOVASCULAR STRESS TEST  12/07/2011   LEXISCAN, normal scan, no significant wall abnormalities noted   CARPAL TUNNEL RELEASE Right ~ 2005   CATARACT EXTRACTION, BILATERAL Bilateral 2000   CYSTOSCOPY  11/02/2021   Procedure: CYSTOSCOPY;  Surgeon: Lafonda Mosses, MD;  Location: [facility];  Service: Gynecology;;   DILATION AND CURETTAGE OF UTERUS  1950's   DOPPLER ECHOCARDIOGRAPHY  12/07/11   EYE SURGERY     FEMUR IM NAIL Right 08/01/2015   Procedure: INTRAMEDULLARY (IM) NAIL FEMORAL;  Surgeon: Marchia Bond, MD;   Location: [place];  Service: Orthopedics;  Laterality: Right;   HARDWARE REMOVAL Right 03/16/2016   Procedure: HARDWARE REMOVAL;  Surgeon: Marchia Bond, MD;  Location: [place];  Service: Orthopedics;  Laterality: Right;   INTRAMEDULLARY (IM) NAIL INTERTROCHANTERIC Left 01/26/2017   Procedure: INTRAMEDULLARY (IM) NAIL INTERTROCHANTRIC;  Surgeon: Leandrew Koyanagi, MD;  Location: [place];  Service: Orthopedics;  Laterality: Left;   JOINT REPLACEMENT     bilateral knee replacement   ROBOTIC ASSISTED BILATERAL SALPINGO OOPHERECTOMY Bilateral 11/02/2021   Procedure: XI ROBOTIC ASSISTED BILATERAL SALPINGO OOPHORECTOMY;  Surgeon: Lafonda Mosses, MD;  Location: [facility];  Service: Gynecology;  Laterality: Bilateral;   ROBOTIC ASSISTED TOTAL HYSTERECTOMY N/A 11/02/2021   Procedure: XI ROBOTIC ASSISTED TOTAL HYSTERECTOMY WITH  MINI LAPAROTOMY;  Surgeon: Lafonda Mosses, MD;  Location: [facility];  Service: Gynecology;  Laterality: N/A;   SKIN CANCER EXCISION  2000's   "off my nose" (08/13/2013)   TOTAL HIP ARTHROPLASTY Right 03/16/2016   Procedure: TOTAL HIP ARTHROPLASTY;  Surgeon: Marchia Bond, MD;  Location: [place];  Service: Orthopedics;  Laterality: Right;   TOTAL HIP ARTHROPLASTY Left 06/09/2017   Procedure: REMOVE LEFT HIP INTRAMEDULLARY NAIL AND CONVERT TO LEFT CEMENTED HEMI HIP ARTHROPLASTY;  Surgeon: Leandrew Koyanagi, MD;  Location: [place];  Service: Orthopedics;  Laterality: Left;   TOTAL KNEE ARTHROPLASTY Right 2005   TOTAL KNEE ARTHROPLASTY  03/02/2012   Procedure: TOTAL KNEE ARTHROPLASTY;  Surgeon: Yvette Rack., MD;  Location: [place];  Service: Orthopedics;  Laterality: Left;    Family History  Problem Relation Age of Onset   Arrhythmia Mother    Heart disease Mother    Stroke Father 30   Arrhythmia Sister    Heart disease Sister    Heart disease Sister    Stroke Sister    Diabetes Sister    Heart disease Brother    Heart disease Brother    Cancer Brother        Colon cancer   Arrhythmia Brother     Heart disease Brother    Heart disease Child 33   Heart disease Child 22   Breast cancer Neg Hx    Ovarian cancer Neg Hx    Endometrial cancer Neg Hx    Pancreatic cancer Neg Hx    Prostate cancer Neg Hx     Social History   Socioeconomic History   Marital status: Widowed    Spouse name: Not on file   Number of children: Not on file   Years of education: Not on file   Highest education level: Not on file  Occupational History   Not on file  Tobacco Use   Smoking status: Never   Smokeless tobacco: Never  Vaping Use   Vaping Use: Never used  Substance and Sexual Activity   Alcohol use: No   Drug use: No   Sexual activity: Not Currently  Other Topics Concern   Not on file  Social History Narrative   Widowed. Lives at [facility]. Son died in the [place] War and she has not slept well since then.   Admitted to [facility]  [place] 01/30/17   Never smoked   Alcohol none   Full Code   Social Determinants of Health   Financial Resource Strain: Not on file  Food Insecurity: Not on file  Transportation Needs: Not on file  Physical Activity: Not on file  Stress: Not on file  Social Connections: Not on file    Current Medications:  Current Outpatient Medications:    acetaminophen (TYLENOL) 325 MG tablet, Take 650 mg by mouth every 6 (six) hours as needed for moderate pain. , Disp: , Rfl:    chlorthalidone (HYGROTON) 25 MG tablet, TAKE 1 TABLET ONCE DAILY., Disp: 90 tablet, Rfl: 3   Cholecalciferol 2000 units CAPS, Take 2,000 Units by mouth every evening., Disp: , Rfl:    diltiazem (CARDIZEM CD) 180 MG 24 hr capsule, TAKE 1 CAPSULE DAILY., Disp: 90 capsule, Rfl: 2   ELIQUIS 2.5 MG TABS tablet, TAKE 1 TABLET BY MOUTH TWICE DAILY., Disp: 180 tablet, Rfl: 1   Flaxseed, Linseed, (FLAXSEED OIL) 1000 MG CAPS, Take 1,000 mg by mouth 2 (two) times daily. , Disp: , Rfl:    levothyroxine (SYNTHROID) 112 MCG tablet, Take 112 mcg by mouth daily before breakfast. , Disp: ,  Rfl:    magnesium hydroxide (MILK OF MAGNESIA) 400 MG/5ML suspension, Take 30 mLs by mouth daily as needed for mild constipation., Disp: , Rfl:    metoprolol (LOPRESSOR) 50 MG tablet, Take 50 mg by mouth 2 (two) times daily., Disp: , Rfl:    Multiple Vitamins-Minerals (PRESERVISION AREDS 2+MULTI VIT PO), Take 1 capsule by mouth 2 (two) times daily., Disp: , Rfl:    senna (SENOKOT) 8.6 MG tablet, Take 2 tablets by mouth at bedtime., Disp: , Rfl:    sulfamethoxazole-trimethoprim (BACTRIM) 400-80 MG tablet, Take 1 tablet by mouth 2 (two) times daily., Disp: 60 tablet, Rfl: 11   temazepam (RESTORIL) 30 MG capsule, Take 1 capsule (30 mg total) by mouth at bedtime as needed for sleep., Disp: 30 capsule, Rfl: 0  Review of Systems: + back pain Denies appetite changes, fevers, chills, fatigue, unexplained weight changes. Denies hearing loss, neck lumps or masses, mouth sores, ringing in ears or voice changes. Denies cough or wheezing.  Denies shortness of breath. Denies chest pain or palpitations. Denies leg swelling. Denies abdominal distention, pain, blood in stools, constipation, diarrhea, nausea, vomiting, or early satiety. Denies pain with intercourse, dysuria, frequency, hematuria or incontinence. Denies hot flashes, pelvic pain, vaginal bleeding or vaginal discharge.   Denies joint pain or muscle pain/cramps. Denies itching, rash, or wounds. Denies dizziness, headaches, numbness or seizures. Denies swollen lymph nodes or glands, denies easy bruising or bleeding. Denies anxiety, depression, confusion, or decreased concentration.  Physical Exam: BP (!) 153/73 (BP Location: Right Arm, Patient Position: Sitting)   Pulse 63   Temp 98 F (36.7 C) (Oral)   Resp 18   Ht '5\' 3"'$  (1.6 m)   Wt 125 lb (56.7 kg)   SpO2 97%   BMI 22.14 kg/m  General: Alert, oriented, no acute distress. HEENT: Normocephalic, atraumatic, sclera anicteric. Chest: Clear to auscultation bilaterally.  No wheezes or  rhonchi. Abdomen: soft, nontender, minimally distended.  Given laxity of abdominal wall and musculature, it is difficult to appreciate whether the patient has a mobile mass within the central abdomen versus small bowel that I can move around.  Normoactive bowel sounds.  No hepatosplenomegaly appreciated.  Well-healed incisions. Extremities: Grossly normal range of motion.  Warm, well perfused.  No edema bilaterally. Skin: No rashes or  lesions noted. Lymphatics: No cervical, supraclavicular, or inguinal adenopathy. GU: Normal appearing external genitalia without erythema, excoriation, or lesions.  Speculum exam reveals moderately atrophic vaginal mucosa, small oozing with manipulation of the speculum.  Cuff intact, no obvious tumor at the vaginal cuff.  Bimanual exam reveals cuff intact, no nodularity or masses.  Laboratory & Radiologic Studies: None new  Assessment & Plan: Leah Holland is a 88 y.o. woman with incompletely staged deeply myoinvasive uterine high-grade carcinoma with clear cell features presenting for surveillance.   Patient is overall doing well and is asymptomatic.  I discussed findings on exam with her and 2 sons who accompanied her today.  I suspect that what I am feeling is just related to her abdominal wall laxity.  I offered that we could get an ultrasound to assess for any intra-abdominal masses.  She continues to decline any sort of systemic therapy.  Her goals continued to revolve around her quality of life.  Since she is feeling so good right now, I do not think that it will be very useful to get imaging but I offered that we could if it would be helpful to know from a planning standpoint.  After discussing this with the 3 of them today, the patient would like to defer any sort of imaging.   In terms of follow-up, we discussed continuing with visits every 3 months.  At this time, we will plan to alternate between phone visits and in person visits.  Her next visit will be  with me by phone.  We reviewed signs and symptoms that should prompt a phone call before her next visit.  22 minutes of total time was spent for this patient encounter, including preparation, face-to-face counseling with the patient and coordination of care, and documentation of the encounter.  [name], MD  Division of Gynecologic Oncology  Department of Obstetrics and Gynecology  [facility]

## 2022-11-25 NOTE — Patient Instructions (Signed)
It was good to see you today.  We will plan to have a phone visit in 3 months and another in person visit in 6 months.  Please call me if you have any change in symptoms between now and our phone visit.

## 2023-01-30 ENCOUNTER — Other Ambulatory Visit: Payer: Self-pay | Admitting: Cardiovascular Disease

## 2023-01-30 DIAGNOSIS — I4891 Unspecified atrial fibrillation: Secondary | ICD-10-CM

## 2023-01-30 NOTE — Telephone Encounter (Signed)
Prescription refill request for Eliquis received. Indication: AF Last office visit: 11/22/22  Leah Fridge MD Scr: 1.20 on 08/22/22 Age: 89 Weight: 56.2kg  Based on above findings Eliquis 2.12m twice daily is the appropriate dose.  Refill approved.

## 2023-03-03 ENCOUNTER — Encounter: Payer: Self-pay | Admitting: Gynecologic Oncology

## 2023-03-03 ENCOUNTER — Inpatient Hospital Stay: Payer: Medicare Other | Attending: Gynecologic Oncology | Admitting: Gynecologic Oncology

## 2023-03-03 DIAGNOSIS — Z8542 Personal history of malignant neoplasm of other parts of uterus: Secondary | ICD-10-CM | POA: Diagnosis not present

## 2023-03-03 DIAGNOSIS — C541 Malignant neoplasm of endometrium: Secondary | ICD-10-CM

## 2023-03-03 NOTE — Progress Notes (Signed)
Gynecologic Oncology Telehealth Note: Gyn-Onc  I connected with Salley Slaughter on 03/03/23 at  4:00 PM EDT by telephone and verified that I am speaking with the correct person using two identifiers.  I discussed the limitations, risks, security and privacy concerns of performing an evaluation and management service by telemedicine and the availability of in-person appointments. I also discussed with the patient that there may be a patient responsible charge related to this service. The patient expressed understanding and agreed to proceed.  Other persons participating in the visit and their role in the encounter: none.  Patient's location: home Provider's location: [facility]  Reason for Visit:  surveillance   Treatment History: 06/2021: Patient seen initially for urinary incontinence. 07/01/2021: CT scan. 09/09/2021: Pelvic ultrasound shows enlarged uterus with internal mass and cystic/solid components.  Bilateral ovaries not identified, no adnexal masses appreciated. 09/24/2021: MRI A/P shows thick-walled cystic lesion in the midline pelvis, which appears to arise from the right ovary, measures 8.5 x 7.5 x 8.1 cm.  Findings highly concerning for cystic ovarian neoplasm.  No adenopathy or metastatic disease definitively noted. 11/02/2021: Robotic total hysterectomy with BSO, mini lap for specimen removal, cystoscopy.  Findings at surgery included enlarged uterus with pyometra, atrophic adnexa, retroperitoneal fibrosis and edema, significant diverticular disease, powder burn lesions on the uterus and pelvic peritoneum consistent with possible endometriosis. 11/02/2021: Final pathology reveals poorly differentiated carcinoma with clear cell features of the uterus, extends to subserosal myometrium.  Depth of invasion 95%, LVI not identified.  Cervix negative for carcinoma.  Adnexa negative for carcinoma.  Interval History: Grandson committed suicide. Saw PA at the facility yesterday - is going to be  started on Cymbalta to help with how she's been feeling. She feels sick to her stomach when she wakes up. She has been on Cymbalta before.  Feeling tired, not eating well. Denies any pain. Denies bleeding or discharge. Using senna nightly with good bowel function. Denies urinary symptoms.  Past Medical/Surgical History: Past Medical History:  Diagnosis Date   Anxiety attack    "take RX prn" (08/13/2013)   Arthritis    "probably" (08/13/2013)   Atrial fibrillation ([place])    new onset/pt report 08/13/2013   Bladder wall thickening 07/01/2021   CT scan in Epic   Bronchitis 12/2011   Cholelithiasis 07/01/2021   CT in Epic.   Common bile duct dilation 07/01/2021   CT in Epic:  13 mm.   Complication of anesthesia    Compression fracture of first lumbar vertebra ([place]) 07/01/2021   CT in Epic.   Depression    Femur fracture (HCC)    left proximal femur non-union   GERD (gastroesophageal reflux disease)    H/O hiatal hernia    Hardware complicating wound infection ([place]) 08/22/2016   History of blood transfusion    "w/both knee OR's" (08/13/2013)   Hyperlipidemia    Hyperlipidemia    Hypertension    Hypertensive urgency 10/22/2018   Hypothyroidism    takes synthroid   Insomnia    Intertrochanteric fracture of right hip ([place]) 07/30/2015   Neuromuscular disorder ([place])    carpal tunnel in right hand   Periprosthetic fracture around internal prosthetic right hip joint ([place]), nonunion basicervical femoral neck with broken hardware and subtrochanteric fracture 03/16/2016   PONV (postoperative nausea and vomiting)    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 Gwenlyn Found, cardiologist 1 x year [phone]   Tendon dysfunction 08/22/2016   Wears hearing aid    [facility]    Past Surgical History:   Procedure Laterality Date   2D ECHOCARDIOGRAM  12/07/2004   EF 48%, moderate pulmonary hypertension   BREAST BIOPSY Left ?1970's   CARDIOVASCULAR STRESS TEST  12/07/11   CARDIOVASCULAR STRESS TEST  12/07/2011   LEXISCAN, normal scan, no significant wall abnormalities noted   CARPAL TUNNEL RELEASE Right ~ 2005   CATARACT EXTRACTION, BILATERAL Bilateral 2000   CYSTOSCOPY  11/02/2021   Procedure: CYSTOSCOPY;  Surgeon: Lafonda Mosses, MD;  Location: [facility];  Service: Gynecology;;   DILATION AND CURETTAGE OF UTERUS  1950's   DOPPLER ECHOCARDIOGRAPHY  12/07/11   EYE SURGERY     FEMUR IM NAIL Right 08/01/2015   Procedure: INTRAMEDULLARY (IM) NAIL FEMORAL;  Surgeon: Marchia Bond, MD;  Location: [place];  Service: Orthopedics;  Laterality: Right;   HARDWARE REMOVAL Right 03/16/2016   Procedure: HARDWARE REMOVAL;  Surgeon: Marchia Bond, MD;  Location: [place];  Service: Orthopedics;  Laterality: Right;   INTRAMEDULLARY (IM) NAIL INTERTROCHANTERIC Left 01/26/2017   Procedure: INTRAMEDULLARY (IM) NAIL INTERTROCHANTRIC;  Surgeon: Leandrew Koyanagi, MD;  Location: [place];  Service: Orthopedics;  Laterality: Left;   JOINT REPLACEMENT     bilateral knee replacement   ROBOTIC ASSISTED BILATERAL SALPINGO OOPHERECTOMY Bilateral 11/02/2021   Procedure: XI ROBOTIC ASSISTED BILATERAL SALPINGO OOPHORECTOMY;  Surgeon: Lafonda Mosses, MD;  [facility];  Service: Gynecology;  Laterality: Bilateral;   ROBOTIC ASSISTED TOTAL HYSTERECTOMY N/A 11/02/2021   Procedure: XI ROBOTIC ASSISTED TOTAL HYSTERECTOMY WITH  MINI LAPAROTOMY;  Surgeon: Lafonda Mosses, MD;  [facility];  Service: Gynecology;  Laterality: N/A;   SKIN CANCER EXCISION  2000's   "off my nose" (08/13/2013)   TOTAL HIP ARTHROPLASTY Right 03/16/2016   Procedure: TOTAL HIP ARTHROPLASTY;  Surgeon: Marchia Bond, MD;  Location: [place];  Service: Orthopedics;  Laterality: Right;   TOTAL HIP ARTHROPLASTY Left 06/09/2017   Procedure: REMOVE LEFT HIP  INTRAMEDULLARY NAIL AND CONVERT TO LEFT CEMENTED HEMI HIP ARTHROPLASTY;  Surgeon: Leandrew Koyanagi, MD;  Location: [place];  Service: Orthopedics;  Laterality: Left;   TOTAL KNEE ARTHROPLASTY Right 2005   TOTAL KNEE ARTHROPLASTY  03/02/2012   Procedure: TOTAL KNEE ARTHROPLASTY;  Surgeon: Yvette Rack., MD;  Location: [place];  Service: Orthopedics;  Laterality: Left;    Family History  Problem Relation Age of Onset   Arrhythmia Mother    Heart disease Mother    Stroke Father 64   Arrhythmia Sister    Heart disease Sister    Heart disease Sister    Stroke Sister    Diabetes Sister    Heart disease Brother    Heart disease Brother    Cancer Brother        Colon cancer   Arrhythmia Brother    Heart disease Brother    Heart disease Child 23   Heart disease Child 24   Breast cancer Neg Hx    Ovarian cancer Neg Hx    Endometrial cancer Neg Hx    Pancreatic cancer Neg Hx    Prostate cancer Neg Hx     Social History   Socioeconomic History   Marital status: Widowed    Spouse name: Not on file   Number  of children: Not on file   Years of education: Not on file   Highest education level: Not on file  Occupational History   Not on file  Tobacco Use   Smoking status: Never   Smokeless tobacco: Never  Vaping Use   Vaping Use: Never used  Substance and Sexual Activity   Alcohol use: No   Drug use: No   Sexual activity: Not Currently  Other Topics Concern   Not on file  Social History Narrative   Widowed. Lives at [facility]. Son died in the [place] War and she has not slept well since then.   Admitted to [place] 01/30/17   Never smoked   Alcohol none   Full Code   Social Determinants of Health   Financial Resource Strain: Not on file  Food Insecurity: Not on file  Transportation Needs: Not on file  Physical Activity: Not on file  Stress: Not on file  Social Connections: Not on file    Current Medications:  Current Outpatient Medications:     acetaminophen (TYLENOL) 325 MG tablet, Take 650 mg by mouth every 6 (six) hours as needed for moderate pain. , Disp: , Rfl:    chlorthalidone (HYGROTON) 25 MG tablet, TAKE 1 TABLET ONCE DAILY., Disp: 90 tablet, Rfl: 3   Cholecalciferol 2000 units CAPS, Take 2,000 Units by mouth every evening., Disp: , Rfl:    diltiazem (CARDIZEM CD) 180 MG 24 hr capsule, TAKE 1 CAPSULE DAILY., Disp: 90 capsule, Rfl: 2   ELIQUIS 2.5 MG TABS tablet, take 1 tablet Orally Twice a day, Disp: 180 tablet, Rfl: 1   Flaxseed, Linseed, (FLAXSEED OIL) 1000 MG CAPS, Take 1,000 mg by mouth 2 (two) times daily. , Disp: , Rfl:    levothyroxine (SYNTHROID) 112 MCG tablet, Take 112 mcg by mouth daily before breakfast. , Disp: , Rfl:    magnesium hydroxide (MILK OF MAGNESIA) 400 MG/5ML suspension, Take 30 mLs by mouth daily as needed for mild constipation., Disp: , Rfl:    metoprolol (LOPRESSOR) 50 MG tablet, Take 50 mg by mouth 2 (two) times daily., Disp: , Rfl:    Multiple Vitamins-Minerals (PRESERVISION AREDS 2+MULTI VIT PO), Take 1 capsule by mouth 2 (two) times daily., Disp: , Rfl:    senna (SENOKOT) 8.6 MG tablet, Take 2 tablets by mouth at bedtime., Disp: , Rfl:    sulfamethoxazole-trimethoprim (BACTRIM) 400-80 MG tablet, Take 1 tablet by mouth 2 (two) times daily., Disp: 60 tablet, Rfl: 11   temazepam (RESTORIL) 30 MG capsule, Take 1 capsule (30 mg total) by mouth at bedtime as needed for sleep., Disp: 30 capsule, Rfl: 0  Review of Symptoms: Pertinent positives as per HPI.  Physical Exam: Deferred given limitations of phone visit.  Laboratory & Radiologic Studies: None new  Assessment & Plan: Leah Holland is a 89 y.o. woman with  incompletely staged deeply myoinvasive uterine high-grade carcinoma with clear cell features presenting for surveillance. Surgery 10/2021. Last imaging 08/2022 - negative fore recurrent disease.   Patient is overall doing well and is asymptomatic from a cancer standpoint.    Unfortunately, her grandson has recently committed suicide.  She is struggling some with this.  Has talked to her PCP at the facility and is going to restart Cymbalta.  My condolences were given.   In terms of follow-up, we discussed continuing with visits every 3 months.  At this time, we will plan on her next visit being by phone. Depending on how she  is doing, can plan for subsequent in person visit.  We reviewed signs and symptoms that should prompt a phone call before her next visit.  I discussed the assessment and treatment plan with the patient. The patient was provided with an opportunity to ask questions and all were answered. The patient agreed with the plan and demonstrated an understanding of the instructions.   The patient was advised to call back or see an in-person evaluation if the symptoms worsen or if the condition fails to improve as anticipated.   12 minutes of total time was spent for this patient encounter, including preparation, phone counseling with the patient and coordination of care, and documentation of the encounter.   [name], MD  Division of Gynecologic Oncology  Department of Obstetrics and Gynecology  [facility]

## 2023-03-29 ENCOUNTER — Other Ambulatory Visit: Payer: Self-pay | Admitting: Infectious Disease

## 2023-03-29 NOTE — Telephone Encounter (Signed)
Patient agreeable to come in on 5/22 to be seen and have labs.

## 2023-03-29 NOTE — Telephone Encounter (Signed)
Please advise if okay to refill. 

## 2023-04-12 ENCOUNTER — Other Ambulatory Visit: Payer: Self-pay | Admitting: Cardiovascular Disease

## 2023-04-26 ENCOUNTER — Other Ambulatory Visit: Payer: Self-pay | Admitting: Infectious Disease

## 2023-05-02 ENCOUNTER — Encounter: Payer: Self-pay | Admitting: Infectious Disease

## 2023-05-02 DIAGNOSIS — T8450XA Infection and inflammatory reaction due to unspecified internal joint prosthesis, initial encounter: Secondary | ICD-10-CM | POA: Insufficient documentation

## 2023-05-02 HISTORY — DX: Infection and inflammatory reaction due to unspecified internal joint prosthesis, initial encounter: T84.50XA

## 2023-05-02 NOTE — Progress Notes (Unsigned)
Subjective:   Chief complaint:  Follow-up for PJI    [name], female    DOB: 12/20/1921, 89 y.o.   MRN: [id number]  HPI  Leah Holland is a 89 year old lady with admission in April of 2017 with right intertrochanteric proximal femur nonunion who progressed to ultimately break her intramedullary nail and elected for surgical management. She had removal of hardware including intramedullary nail, and treatment of intertrochanteric fracture with prosthesis placement. Dr. Dion Saucier got several intra-operative cultures due to concerrn for infection and two separate cultures grew serratia. Patient was placed on oral levaquin after discussion with me and has remained on this some time.. She has done relatively well but recently began to complain of pain behind her prosthetic knees which she believed might be due to toxicity of fluoroquinolone to tendons after she and her son--who is a Pediatrician read about side effects of FQ.   I was  actually skeptical about FQ toxicity here but I DID think she would be better served over the long run if she is to be on chronic suppressive antibiotics NOT being on a fluoroquinolone given risk for Clostridium difficile colitis in particular. We changed her  over to single strength Bactrim and her tendon pain went away immediately. She has had normal creatinine and potassium while on single strength Bactrim.  She then  fractured her opposite hip and underwent IM nail by Dr. Roda Shutters   She ended up needing a THA on that side as well.   Bilateral knee replacements as well.  She then had a  periprosthetic fracture of left femur which was treated non operatively with having her be not weight bearing.  She then developed quadriceps tendinitis when she did bear weight because she was bearing it more so on the right.  She is being followed closely by Dr. Roda Shutters.  She is now   She was found to have a poorly differentiated carcinoma when she recently  had TAH.  She has elected against systemic chemotherapy.  She has remained on her bactrim and does not want to come off of this antibiotic and risk recurrence.   Past Medical History:  Diagnosis Date   Anxiety attack    "take RX prn" (08/13/2013)   Arthritis    "probably" (08/13/2013)   Atrial fibrillation (HCC)    new onset/pt report 08/13/2013   Bladder wall thickening 07/01/2021   CT scan in Epic   Bronchitis 12/2011   Cholelithiasis 07/01/2021   CT in Epic.   Common bile duct dilation 07/01/2021   CT in Epic:  13 mm.   Complication of anesthesia    Compression fracture of first lumbar vertebra (HCC) 07/01/2021   CT in Epic.   Depression    Femur fracture (HCC)    left proximal femur non-union   GERD (gastroesophageal reflux disease)    H/O hiatal hernia    Hardware complicating wound infection (HCC) 08/22/2016   History of blood transfusion    "w/both knee OR's" (08/13/2013)   Hyperlipidemia    Hyperlipidemia    Hypertension    Hypertensive urgency 10/22/2018   Hypothyroidism    takes synthroid   Insomnia    Intertrochanteric fracture of right hip (HCC) 07/30/2015   Neuromuscular disorder (HCC)    carpal tunnel in right hand   Periprosthetic fracture around internal prosthetic right hip joint (HCC), nonunion basicervical femoral neck with broken hardware and subtrochanteric fracture 03/16/2016   PONV (postoperative nausea  and vomiting)    Prosthetic hip infection (HCC) 08/22/2016   Proteus infection 08/22/2016   Serratia infection 11/14/2016   Shingles    Skin cancer of face 2000's   "one on each side of my face and one on my nose" (08/13/2013)   Swelling of extremity    sees Dr. Allyson Sabal, cardiologist 1 x year [phone]   Tendon dysfunction 08/22/2016   Wears hearing aid    [facility]    Past Surgical History:  Procedure Laterality Date   2D ECHOCARDIOGRAM  12/07/2004   EF 48%, moderate pulmonary hypertension   BREAST BIOPSY Left ?1970's   CARDIOVASCULAR STRESS TEST   12/07/11   CARDIOVASCULAR STRESS TEST  12/07/2011   LEXISCAN, normal scan, no significant wall abnormalities noted   CARPAL TUNNEL RELEASE Right ~ 2005   CATARACT EXTRACTION, BILATERAL Bilateral 2000   CYSTOSCOPY  11/02/2021   Procedure: CYSTOSCOPY;  Surgeon: Carver Fila, MD;  [facility];  Service: Gynecology;;   DILATION AND CURETTAGE OF UTERUS  1950's   DOPPLER ECHOCARDIOGRAPHY  12/07/11   EYE SURGERY     FEMUR IM NAIL Right 08/01/2015   Procedure: INTRAMEDULLARY (IM) NAIL FEMORAL;  Surgeon: Teryl Lucy, MD;  [facility];  Service: Orthopedics;  Laterality: Right;   HARDWARE REMOVAL Right 03/16/2016   Procedure: HARDWARE REMOVAL;  Surgeon: Teryl Lucy, MD;  [facility];  Service: Orthopedics;  Laterality: Right;   INTRAMEDULLARY (IM) NAIL INTERTROCHANTERIC Left 01/26/2017   Procedure: INTRAMEDULLARY (IM) NAIL INTERTROCHANTRIC;  Surgeon: Tarry Kos, MD;  [facility];  Service: Orthopedics;  Laterality: Left;   JOINT REPLACEMENT     bilateral knee replacement   ROBOTIC ASSISTED BILATERAL SALPINGO OOPHERECTOMY Bilateral 11/02/2021   Procedure: XI ROBOTIC ASSISTED BILATERAL SALPINGO OOPHORECTOMY;  Surgeon: Carver Fila, MD;  [facility];  Service: Gynecology;  Laterality: Bilateral;   ROBOTIC ASSISTED TOTAL HYSTERECTOMY N/A 11/02/2021   Procedure: XI ROBOTIC ASSISTED TOTAL HYSTERECTOMY WITH  MINI LAPAROTOMY;  Surgeon: Carver Fila, MD;  [facility];  Service: Gynecology;  Laterality: N/A;   SKIN CANCER EXCISION  2000's   "off my nose" (08/13/2013)   TOTAL HIP ARTHROPLASTY Right 03/16/2016   Procedure: TOTAL HIP ARTHROPLASTY;  Surgeon: Teryl Lucy, MD;  [facility];  Service: Orthopedics;  Laterality: Right;   TOTAL HIP ARTHROPLASTY Left 06/09/2017   Procedure: REMOVE LEFT HIP INTRAMEDULLARY NAIL AND CONVERT TO LEFT CEMENTED HEMI HIP ARTHROPLASTY;  Surgeon: Tarry Kos, MD;  [facility];  Service: Orthopedics;  Laterality: Left;    TOTAL KNEE ARTHROPLASTY Right 2005   TOTAL KNEE ARTHROPLASTY  03/02/2012   Procedure: TOTAL KNEE ARTHROPLASTY;  Surgeon: Thera Flake., MD;  [facility];  Service: Orthopedics;  Laterality: Left;    Family History  Problem Relation Age of Onset   Arrhythmia Mother    Heart disease Mother    Stroke Father 32   Arrhythmia Sister    Heart disease Sister    Heart disease Sister    Stroke Sister    Diabetes Sister    Heart disease Brother    Heart disease Brother    Cancer Brother        Colon cancer   Arrhythmia Brother    Heart disease Brother    Heart disease Child 46   Heart disease Child 56   Breast cancer Neg Hx    Ovarian cancer Neg Hx    Endometrial cancer Neg Hx    Pancreatic cancer  Neg Hx    Prostate cancer Neg Hx       Social History   Socioeconomic History   Marital status: Widowed    Spouse name: Not on file   Number of children: Not on file   Years of education: Not on file   Highest education level: Not on file  Occupational History   Not on file  Tobacco Use   Smoking status: Never   Smokeless tobacco: Never  Vaping Use   Vaping Use: Never used  Substance and Sexual Activity   Alcohol use: No   Drug use: No   Sexual activity: Not Currently  Other Topics Concern   Not on file  Social History Narrative   Widowed. Lives at [facility]. Son died in the [place] War and she has not slept well since then.   Admitted to [facility] 01/30/17   Never smoked   Alcohol none   Full Code   Social Determinants of Health   Financial Resource Strain: Not on file  Food Insecurity: Not on file  Transportation Needs: Not on file  Physical Activity: Not on file  Stress: Not on file  Social Connections: Not on file    Allergies  Allergen Reactions   Levaquin [Levofloxacin] Other (See Comments)    tendinopathy   Morphine And Codeine Other (See Comments)    Made pt "wild"   Morphine Sulfate     Other reaction(s): made pt wild      Current Outpatient Medications:    acetaminophen (TYLENOL) 325 MG tablet, Take 650 mg by mouth every 6 (six) hours as needed for moderate pain. , Disp: , Rfl:    chlorthalidone (HYGROTON) 25 MG tablet, TAKE 1 TABLET ONCE DAILY., Disp: 90 tablet, Rfl: 2   Cholecalciferol 2000 units CAPS, Take 2,000 Units by mouth every evening., Disp: , Rfl:    diltiazem (CARDIZEM CD) 180 MG 24 hr capsule, TAKE 1 CAPSULE DAILY., Disp: 90 capsule, Rfl: 2   ELIQUIS 2.5 MG TABS tablet, take 1 tablet Orally Twice a day, Disp: 180 tablet, Rfl: 1   Flaxseed, Linseed, (FLAXSEED OIL) 1000 MG CAPS, Take 1,000 mg by mouth 2 (two) times daily. , Disp: , Rfl:    levothyroxine (SYNTHROID) 112 MCG tablet, Take 112 mcg by mouth daily before breakfast. , Disp: , Rfl:    magnesium hydroxide (MILK OF MAGNESIA) 400 MG/5ML suspension, Take 30 mLs by mouth daily as needed for mild constipation., Disp: , Rfl:    metoprolol (LOPRESSOR) 50 MG tablet, Take 50 mg by mouth 2 (two) times daily., Disp: , Rfl:    Multiple Vitamins-Minerals (PRESERVISION AREDS 2+MULTI VIT PO), Take 1 capsule by mouth 2 (two) times daily., Disp: , Rfl:    senna (SENOKOT) 8.6 MG tablet, Take 2 tablets by mouth at bedtime., Disp: , Rfl:    sulfamethoxazole-trimethoprim (BACTRIM) 400-80 MG tablet, TAKE ONE TABLET BY MOUTH TWICE DAILY, Disp: 60 tablet, Rfl: 0   temazepam (RESTORIL) 30 MG capsule, Take 1 capsule (30 mg total) by mouth at bedtime as needed for sleep., Disp: 30 capsule, Rfl: 0   Review of Systems  Constitutional:  Negative for activity change, appetite change, chills, diaphoresis, fatigue, fever and unexpected weight change.  HENT:  Negative for congestion, rhinorrhea, sinus pressure, sneezing, sore throat and trouble swallowing.   Eyes:  Negative for photophobia and visual disturbance.  Respiratory:  Negative for cough, chest tightness, shortness of breath, wheezing and stridor.   Cardiovascular:  Negative for chest  pain, palpitations and leg  swelling.  Gastrointestinal:  Negative for abdominal distention, abdominal pain, anal bleeding, blood in stool, constipation, diarrhea, nausea and vomiting.  Musculoskeletal:  Negative for arthralgias, back pain, gait problem, joint swelling and myalgias.  Skin:  Negative for color change, pallor, rash and wound.  Neurological:  Negative for dizziness, tremors, weakness and light-headedness.  Hematological:  Negative for adenopathy. Does not bruise/bleed easily.  Psychiatric/Behavioral:  Negative for agitation, behavioral problems, confusion, decreased concentration, dysphoric mood and sleep disturbance.        Objective:   Physical Exam Constitutional:      Appearance: She is obese.  HENT:     Head: Normocephalic and atraumatic.     Nose: Nose normal.  Cardiovascular:     Rate and Rhythm: Normal rate.  Pulmonary:     Effort: Pulmonary effort is normal. No respiratory distress.     Breath sounds: No wheezing.  Abdominal:     General: Bowel sounds are normal. There is no distension.     Tenderness: There is no abdominal tenderness.  Musculoskeletal:     Cervical back: Normal range of motion.  Skin:    General: Skin is warm and dry.  Neurological:     General: No focal deficit present.     Mental Status: She is alert and oriented to person, place, and time.  Psychiatric:        Mood and Affect: Mood normal.        Behavior: Behavior normal.        Thought Content: Thought content normal.        Judgment: Judgment normal.           Assessment & Plan:   Prosthetic hip infection:  I am rechecking ESR, CRP BMP and CBC w diff  I am continuing her SS Bactrim   Aggressive endometrial carcinoma: she has had TAH but does not wish for chemotherapy  She has been connected to hospice.  She seems very much at peace with these decisions and we will support her in them.

## 2023-05-03 ENCOUNTER — Encounter: Payer: Self-pay | Admitting: Infectious Disease

## 2023-05-03 ENCOUNTER — Non-Acute Institutional Stay: Payer: Medicare Other | Admitting: Family Medicine

## 2023-05-03 ENCOUNTER — Other Ambulatory Visit: Payer: Self-pay

## 2023-05-03 ENCOUNTER — Encounter: Payer: Self-pay | Admitting: Family Medicine

## 2023-05-03 ENCOUNTER — Ambulatory Visit (INDEPENDENT_AMBULATORY_CARE_PROVIDER_SITE_OTHER): Payer: Medicare Other | Admitting: Infectious Disease

## 2023-05-03 VITALS — BP 129/71 | HR 69 | Temp 97.5°F | Wt 118.0 lb

## 2023-05-03 DIAGNOSIS — T8450XS Infection and inflammatory reaction due to unspecified internal joint prosthesis, sequela: Secondary | ICD-10-CM

## 2023-05-03 DIAGNOSIS — M9701XA Periprosthetic fracture around internal prosthetic right hip joint, initial encounter: Secondary | ICD-10-CM

## 2023-05-03 DIAGNOSIS — T8451XD Infection and inflammatory reaction due to internal right hip prosthesis, subsequent encounter: Secondary | ICD-10-CM

## 2023-05-03 DIAGNOSIS — C541 Malignant neoplasm of endometrium: Secondary | ICD-10-CM

## 2023-05-03 DIAGNOSIS — F4321 Adjustment disorder with depressed mood: Secondary | ICD-10-CM

## 2023-05-03 DIAGNOSIS — R634 Abnormal weight loss: Secondary | ICD-10-CM | POA: Diagnosis not present

## 2023-05-03 DIAGNOSIS — T8450XD Infection and inflammatory reaction due to unspecified internal joint prosthesis, subsequent encounter: Secondary | ICD-10-CM

## 2023-05-03 DIAGNOSIS — F4381 Prolonged grief disorder: Secondary | ICD-10-CM

## 2023-05-03 HISTORY — DX: Adjustment disorder with depressed mood: F43.21

## 2023-05-03 MED ORDER — SULFAMETHOXAZOLE-TRIMETHOPRIM 400-80 MG PO TABS: 1.0000 | ORAL_TABLET | Freq: Two times a day (BID) | ORAL | 0 refills | Status: DC

## 2023-05-03 NOTE — Progress Notes (Signed)
Therapist, nutritional Palliative Care Consult Note Telephone: [phone]  Fax: [phone]   Date of encounter: 05/03/23 12:06 PM PATIENT NAME: Leah Holland [address] [place] [address]   [phone] (home) [phone] (work) DOB: 12-09-1922 MRN: [id number] PRIMARY CARE PROVIDER:    Ileana Ladd, MD (Inactive),  No address on file None  REFERRING PROVIDER:   No referring provider defined for this encounter. N/A  Emergency Contact:    Contact Information     Name Relation Home Work Mobile   [place] Son [phone]  [phone]   Cilia, Piedra [phone]  [phone]       Health Care POA-[name] Susann Givens, MD (son)   I met face to face with patient in [facility]. Palliative Care was asked to follow this patient by consultation request of No ref. provider found to address advance care planning and complex medical decision making. This is a follow up visit. Left vm for [facility] POA to call back about family member seen today at [facility] and provided call back number.   CODE STATUS: Has MOST    ASSESSMENT AND / RECOMMENDATIONS:  PPS: 60% Complicated grief PTSD triggered by grandson's death for her son that died in [place]. Encouraged to contact [facility] to see if she could access grief counseling services. Provided emergency contact number for [facility]   Endometrial cancer Pt has had full hysterectomy and was advised that if cancer recurs many times it seeds perineum and would have symptoms of bloating, gas, early satiety, nausea/vomiting and ascites. Keep scheduled follow up with GYN Oncologist.  Infection of prosthetic joint  Seen by Dr Daiva Eves with ID who changed chronic suppressive therapy from Levaquin to Bactrim single strength which resolved pt's tendon pain behind her knees. Dr Daiva Eves indicated no concern for tendon rupture with Levaquin but concern for possible C diff  colitis.  Follow up Palliative Care Visit:  Palliative Care continuing to follow up by monitoring for changes in appetite, weight, functional and cognitive status for chronic disease progression and management in agreement with patient's stated goals of care. Next visit in 4 weeks or prn.  This visit was coded based on medical decision making (MDM).  Chief Complaint  Palliative Care is continuing to follow pt for chronic medical management with hx of endometrial cancer and on chronic antibiotic suppression from hip replacement to prevent infection.  HISTORY OF PRESENT ILLNESS: Leah Holland is a 89 y.o. year old female with clear cell endometrial cancer s/p hysterectomy/BSO with no wish for further treatment given her advanced age.  Pt lost her grandson to suicide in January 2024.  Her loss of her grandson reminded her of losing her son in [place].  She states she began eating again and has had a one lb weight gain, had lost about 8 lbs. Denies CP, SOB, nausea, falls, dysuria.  Takes 2 Senna at night to help regulate bowels, sleeps well. No falls using rollator for mobility. Has some short term memory loss. Has recently seen Dr Daiva Eves who has started her on Bactrim for her hip to prevent osteomyelitis. She sees the GYN Oncologist soon.     ACTIVITIES OF DAILY LIVING: CONTINENT OF BLADDER/ BOWEL? Yes BATHING/DRESSING/FEEDING: Independent  MOBILITY:   Rollator   APPETITE? Fair but improving WEIGHT: 118 lbs today at ID office, was 125 lbs on 11/25/22 at GYN Oncology visit  CURRENT PROBLEM LIST:  Patient Active Problem List   Diagnosis  Date Noted   Grieving 05/03/2023   Prosthetic joint infection (HCC) 05/02/2023   Mild cognitive impairment 11/16/2022   Flatulence 11/16/2022   Palliative care encounter 03/08/2022   Stress incontinence 03/08/2022   Endometrial carcinoma (HCC) 02/28/2022   Pelvic mass in female 11/02/2021   Aortic atherosclerosis (HCC) 09/30/2021   Pelvic mass  09/29/2021   Dyspnea on exertion 02/12/2021   Hypophosphatemia 10/07/2020   Hypomagnesemia 10/07/2020   Hypokalemia 10/07/2020   Periprosthetic fracture around internal prosthetic left hip joint (HCC) 10/03/2020   Leg fracture, left, closed, initial encounter 10/01/2020   Hypertensive urgency 10/22/2018   Chronic low back pain 12/21/2017   Chronic antibiotic suppression 06/13/2017   History of hip replacement 06/09/2017   Ecchymosis 02/23/2017   Closed intertrochanteric fracture with nonunion, left 01/26/2017   Leukocytosis 01/26/2017   Fall 01/26/2017   Prosthetic hip infection (HCC) 08/22/2016   Tendinopathy 08/22/2016   Depression, recurrent (HCC) 04/07/2016   Arthritis of knee, degenerative 04/07/2016   OP (osteoporosis) 04/07/2016   Agoraphobia with panic disorder 04/07/2016   Chronic anticoagulation 08/06/2015   Bipolar disorder (HCC) 08/04/2015   Hyponatremia 08/04/2015   Constipation 08/04/2015   Complication of anesthesia 07/30/2015   Diabetes mellitus type 2, diet-controlled (HCC)    Skin cancer of face    Paroxysmal atrial fibrillation (HCC) 09/01/2014   Right carotid bruit 11/18/2013   Postoperative anemia due to acute blood loss 03/03/2012   Hypertension    Hyperlipidemia    Swelling of extremity    Cataract    Hypothyroidism    GERD (gastroesophageal reflux disease)    Arthritis    Insomnia    PAST MEDICAL HISTORY:  Active Ambulatory Problems    Diagnosis Date Noted   Hypertension    Hyperlipidemia    Swelling of extremity    Cataract    Hypothyroidism    GERD (gastroesophageal reflux disease)    Arthritis    Insomnia    Postoperative anemia due to acute blood loss 03/03/2012   Right carotid bruit 11/18/2013   Paroxysmal atrial fibrillation (HCC) 09/01/2014   Diabetes mellitus type 2, diet-controlled (HCC)    Skin cancer of face    Complication of anesthesia 07/30/2015   Bipolar disorder (HCC) 08/04/2015   Hyponatremia 08/04/2015   Constipation  08/04/2015   Chronic anticoagulation 08/06/2015   Depression, recurrent (HCC) 04/07/2016   Arthritis of knee, degenerative 04/07/2016   OP (osteoporosis) 04/07/2016   Agoraphobia with panic disorder 04/07/2016   Prosthetic hip infection (HCC) 08/22/2016   Tendinopathy 08/22/2016   Closed intertrochanteric fracture with nonunion, left 01/26/2017   Leukocytosis 01/26/2017   Fall 01/26/2017   Ecchymosis 02/23/2017   History of hip replacement 06/09/2017   Chronic antibiotic suppression 06/13/2017   Chronic low back pain 12/21/2017   Hypertensive urgency 10/22/2018   Leg fracture, left, closed, initial encounter 10/01/2020   Periprosthetic fracture around internal prosthetic left hip joint (HCC) 10/03/2020   Hypophosphatemia 10/07/2020   Hypomagnesemia 10/07/2020   Hypokalemia 10/07/2020   Dyspnea on exertion 02/12/2021   Pelvic mass 09/29/2021   Aortic atherosclerosis (HCC) 09/30/2021   Pelvic mass in female 11/02/2021   Endometrial carcinoma (HCC) 02/28/2022   Palliative care encounter 03/08/2022   Stress incontinence 03/08/2022   Mild cognitive impairment 11/16/2022   Flatulence 11/16/2022   Prosthetic joint infection (HCC) 05/02/2023   Grieving 05/03/2023   Resolved Ambulatory Problems    Diagnosis Date Noted   PONV (postoperative nausea and vomiting)    Chest pain 04/26/2013  Atrial fibrillation with rapid ventricular response (HCC) 08/13/2013   Bronchitis 12/13/2011   Fracture, intertrochanteric, right femur (HCC)    S/P ORIF (open reduction internal fixation) fracture    Preoperative clearance 03/08/2016   Periprosthetic fracture around internal prosthetic right hip joint (HCC), nonunion basicervical femoral neck with broken hardware and subtrochanteric fracture 03/16/2016   Hip fx (HCC) 03/16/2016   Hardware complicating wound infection (HCC) 08/22/2016   Proteus infection 08/22/2016   Serratia infection 11/14/2016   Past Medical History:  Diagnosis Date    Anxiety attack    Atrial fibrillation (HCC)    Bladder wall thickening 07/01/2021   Cholelithiasis 07/01/2021   Common bile duct dilation 07/01/2021   Compression fracture of first lumbar vertebra (HCC) 07/01/2021   Depression    Femur fracture (HCC)    H/O hiatal hernia    History of blood transfusion    Intertrochanteric fracture of right hip (HCC) 07/30/2015   Neuromuscular disorder (HCC)    Shingles    Tendon dysfunction 08/22/2016   Wears hearing aid    SOCIAL HX:  Social History   Tobacco Use   Smoking status: Never   Smokeless tobacco: Never  Substance Use Topics   Alcohol use: No   FAMILY HX:  Family History  Problem Relation Age of Onset   Arrhythmia Mother    Heart disease Mother    Stroke Father 32   Arrhythmia Sister    Heart disease Sister    Heart disease Sister    Stroke Sister    Diabetes Sister    Heart disease Brother    Heart disease Brother    Cancer Brother        Colon cancer   Arrhythmia Brother    Heart disease Brother    Heart disease Child 46   Heart disease Child 66   Breast cancer Neg Hx    Ovarian cancer Neg Hx    Endometrial cancer Neg Hx    Pancreatic cancer Neg Hx    Prostate cancer Neg Hx        Preferred Pharmacy: ALLERGIES:  Allergies  Allergen Reactions   Levaquin [Levofloxacin] Other (See Comments)    tendinopathy   Morphine And Codeine Other (See Comments)    Made pt "wild"   Morphine Sulfate     Other reaction(s): made pt wild     PERTINENT MEDICATIONS:  Outpatient Encounter Medications as of 05/03/2023  Medication Sig   acetaminophen (TYLENOL) 325 MG tablet Take 650 mg by mouth every 6 (six) hours as needed for moderate pain.    chlorthalidone (HYGROTON) 25 MG tablet TAKE 1 TABLET ONCE DAILY.   Cholecalciferol 2000 units CAPS Take 2,000 Units by mouth every evening.   diltiazem (CARDIZEM CD) 180 MG 24 hr capsule TAKE 1 CAPSULE DAILY.   ELIQUIS 2.5 MG TABS tablet take 1 tablet Orally Twice a day   Flaxseed,  Linseed, (FLAXSEED OIL) 1000 MG CAPS Take 1,000 mg by mouth 2 (two) times daily.    levothyroxine (SYNTHROID) 112 MCG tablet Take 112 mcg by mouth daily before breakfast.    magnesium hydroxide (MILK OF MAGNESIA) 400 MG/5ML suspension Take 30 mLs by mouth daily as needed for mild constipation.   metoprolol (LOPRESSOR) 50 MG tablet Take 50 mg by mouth 2 (two) times daily.   Multiple Vitamins-Minerals (PRESERVISION AREDS 2+MULTI VIT PO) Take 1 capsule by mouth 2 (two) times daily.   senna (SENOKOT) 8.6 MG tablet Take 2 tablets by mouth at bedtime.  sulfamethoxazole-trimethoprim (BACTRIM) 400-80 MG tablet Take 1 tablet by mouth 2 (two) times daily.   temazepam (RESTORIL) 30 MG capsule Take 1 capsule (30 mg total) by mouth at bedtime as needed for sleep.   No facility-administered encounter medications on file as of 05/03/2023.    History obtained from review of EMR, discussion with patient.         I reviewed available labs, medications, imaging, studies and related documents from the EMR.  There were no new records/imaging since last visit.   Physical Exam: GENERAL: NAD LUNGS: CTAB, no increased work of breathing, room air CARDIAC:  S1S2, RRR with occasional premature beat, no MRG, No edema/cyanosis ABD:  Normo-active BS x 4 quads, soft, non-tender EXTREMITIES: Normal ROM, no deformity, strength equal, No muscle atrophy/subcutaneous fat loss NEURO:  No weakness or cognitive impairment, aphasia-expressive/receptive  PSYCH:  non-anxious affect, A & O x 3  Thank you for the opportunity to participate in the care of Shamona Brunelli. Please call our main office at [phone] if we can be of additional assistance.    Joycelyn Man FNP-C  [email or web address]  Phone:  [phone]

## 2023-05-04 LAB — CBC WITH DIFFERENTIAL/PLATELET
Absolute Monocytes: 627 cells/uL (ref 200–950)
Basophils Absolute: 40 cells/uL (ref 0–200)
Basophils Relative: 0.6 %
Eosinophils Absolute: 178 cells/uL (ref 15–500)
Eosinophils Relative: 2.7 %
HCT: 45.5 % — ABNORMAL HIGH (ref 35.0–45.0)
Hemoglobin: 15.2 g/dL (ref 11.7–15.5)
Lymphs Abs: 1907 cells/uL (ref 850–3900)
MCH: 31.7 pg (ref 27.0–33.0)
MCHC: 33.4 g/dL (ref 32.0–36.0)
MCV: 95 fL (ref 80.0–100.0)
MPV: 10.7 fL (ref 7.5–12.5)
Monocytes Relative: 9.5 %
Neutro Abs: 3848 cells/uL (ref 1500–7800)
Neutrophils Relative %: 58.3 %
Platelets: 244 10*3/uL (ref 140–400)
RBC: 4.79 10*6/uL (ref 3.80–5.10)
RDW: 12 % (ref 11.0–15.0)
Total Lymphocyte: 28.9 %
WBC: 6.6 10*3/uL (ref 3.8–10.8)

## 2023-05-04 LAB — BASIC METABOLIC PANEL WITH GFR
BUN/Creatinine Ratio: 26 (calc) — ABNORMAL HIGH (ref 6–22)
BUN: 29 mg/dL — ABNORMAL HIGH (ref 7–25)
CO2: 34 mmol/L — ABNORMAL HIGH (ref 20–32)
Calcium: 11.1 mg/dL — ABNORMAL HIGH (ref 8.6–10.4)
Chloride: 97 mmol/L — ABNORMAL LOW (ref 98–110)
Creat: 1.13 mg/dL — ABNORMAL HIGH (ref 0.60–0.95)
Glucose, Bld: 103 mg/dL — ABNORMAL HIGH (ref 65–99)
Potassium: 3.8 mmol/L (ref 3.5–5.3)
Sodium: 138 mmol/L (ref 135–146)
eGFR: 44 mL/min/{1.73_m2} — ABNORMAL LOW (ref >=60)

## 2023-05-04 LAB — C-REACTIVE PROTEIN: CRP: <3 mg/L (ref <8.0)

## 2023-05-04 LAB — SEDIMENTATION RATE: Sed Rate: 2 mm/h (ref 0–30)

## 2023-05-25 ENCOUNTER — Ambulatory Visit: Payer: Medicare Other | Admitting: Gynecologic Oncology

## 2023-05-26 ENCOUNTER — Inpatient Hospital Stay: Payer: Medicare Other | Attending: Gynecologic Oncology | Admitting: Gynecologic Oncology

## 2023-05-26 ENCOUNTER — Encounter: Payer: Self-pay | Admitting: Gynecologic Oncology

## 2023-05-26 DIAGNOSIS — C541 Malignant neoplasm of endometrium: Secondary | ICD-10-CM

## 2023-05-26 NOTE — Progress Notes (Signed)
Gynecologic Oncology Telehealth Note: Gyn-Onc  I connected with [name] on 05/26/23 at  4:30 PM EDT by telephone and verified that I am speaking with the correct person using two identifiers.  I discussed the limitations, risks, security and privacy concerns of performing an evaluation and management service by telemedicine and the availability of in-person appointments. I also discussed with the patient that there may be a patient responsible charge related to this service. The patient expressed understanding and agreed to proceed.  Other persons participating in the visit and their role in the encounter: none.  Patient's location: home Provider's location: [facility]  Reason for Visit: surveillance   Treatment History: 06/2021: Patient seen initially for urinary incontinence. 07/01/2021: CT scan. 09/09/2021: Pelvic ultrasound shows enlarged uterus with internal mass and cystic/solid components.  Bilateral ovaries not identified, no adnexal masses appreciated. 09/24/2021: MRI A/P shows thick-walled cystic lesion in the midline pelvis, which appears to arise from the right ovary, measures 8.5 x 7.5 x 8.1 cm.  Findings highly concerning for cystic ovarian neoplasm.  No adenopathy or metastatic disease definitively noted. 11/02/2021: Robotic total hysterectomy with BSO, mini lap for specimen removal, cystoscopy.  Findings at surgery included enlarged uterus with pyometra, atrophic adnexa, retroperitoneal fibrosis and edema, significant diverticular disease, powder burn lesions on the uterus and pelvic peritoneum consistent with possible endometriosis. 11/02/2021: Final pathology reveals poorly differentiated carcinoma with clear cell features of the uterus, extends to subserosal myometrium.  Depth of invasion 95%, LVI not identified.  Cervix negative for carcinoma.  Adnexa negative for carcinoma.  Interval History: Has been struggling after death of grandson. Doing better now. Walking well.  Appetite improved (lost appetite after death of grandson) - has gained 2 pounds back.  Bowels moving. Denies urinary symptoms.   Past Medical/Surgical History: Past Medical History:  Diagnosis Date   Anxiety attack    "take RX prn" (08/13/2013)   Arthritis    "probably" (08/13/2013)   Atrial fibrillation (HCC)    new onset/pt report 08/13/2013   Bladder wall thickening 07/01/2021   CT scan in Epic   Bronchitis 12/2011   Cholelithiasis 07/01/2021   CT in Epic.   Common bile duct dilation 07/01/2021   CT in Epic:  13 mm.   Complication of anesthesia    Compression fracture of first lumbar vertebra (HCC) 07/01/2021   CT in Epic.   Depression    Femur fracture (HCC)    left proximal femur non-union   GERD (gastroesophageal reflux disease)    Grieving 05/03/2023   H/O hiatal hernia    Hardware complicating wound infection (HCC) 08/22/2016   History of blood transfusion    "w/both knee OR's" (08/13/2013)   Hyperlipidemia    Hyperlipidemia    Hypertension    Hypertensive urgency 10/22/2018   Hypothyroidism    takes synthroid   Insomnia    Intertrochanteric fracture of right hip (HCC) 07/30/2015   Neuromuscular disorder (HCC)    carpal tunnel in right hand   Periprosthetic fracture around internal prosthetic right hip joint (HCC), nonunion basicervical femoral neck with broken hardware and subtrochanteric fracture 03/16/2016   PONV (postoperative nausea and vomiting)    Prosthetic hip infection (HCC) 08/22/2016   Prosthetic joint infection (HCC) 05/02/2023   Proteus infection 08/22/2016   Serratia infection 11/14/2016   Shingles    Skin cancer of face 2000's   "one on each side of my face and one on my nose" (08/13/2013)   Swelling of extremity    sees  Dr. Allyson Sabal, cardiologist 1 x year [phone]   Tendon dysfunction 08/22/2016   Wears hearing aid    [facility]    Past Surgical History:  Procedure Laterality Date   2D ECHOCARDIOGRAM  12/07/2004   EF 48%, moderate pulmonary  hypertension   BREAST BIOPSY Left ?1970's   CARDIOVASCULAR STRESS TEST  12/07/11   CARDIOVASCULAR STRESS TEST  12/07/2011   LEXISCAN, normal scan, no significant wall abnormalities noted   CARPAL TUNNEL RELEASE Right ~ 2005   CATARACT EXTRACTION, BILATERAL Bilateral 2000   CYSTOSCOPY  11/02/2021   Procedure: CYSTOSCOPY;  Surgeon: Carver Fila, MD;  Location: [facility];  Service: Gynecology;;   DILATION AND CURETTAGE OF UTERUS  1950's   DOPPLER ECHOCARDIOGRAPHY  12/07/11   EYE SURGERY     FEMUR IM NAIL Right 08/01/2015   Procedure: INTRAMEDULLARY (IM) NAIL FEMORAL;  Surgeon: Teryl Lucy, MD;  Location: [facility] OR;  Service: Orthopedics;  Laterality: Right;   HARDWARE REMOVAL Right 03/16/2016   Procedure: HARDWARE REMOVAL;  Surgeon: Teryl Lucy, MD;  Location: [facility] OR;  Service: Orthopedics;  Laterality: Right;   INTRAMEDULLARY (IM) NAIL INTERTROCHANTERIC Left 01/26/2017   Procedure: INTRAMEDULLARY (IM) NAIL INTERTROCHANTRIC;  Surgeon: Tarry Kos, MD;  Location: [facility] OR;  Service: Orthopedics;  Laterality: Left;   JOINT REPLACEMENT     bilateral knee replacement   ROBOTIC ASSISTED BILATERAL SALPINGO OOPHERECTOMY Bilateral 11/02/2021   Procedure: XI ROBOTIC ASSISTED BILATERAL SALPINGO OOPHORECTOMY;  Surgeon: Carver Fila, MD;  Location: [facility];  Service: Gynecology;  Laterality: Bilateral;   ROBOTIC ASSISTED TOTAL HYSTERECTOMY N/A 11/02/2021   Procedure: XI ROBOTIC ASSISTED TOTAL HYSTERECTOMY WITH  MINI LAPAROTOMY;  Surgeon: Carver Fila, MD;  Location: [facility];  Service: Gynecology;  Laterality: N/A;   SKIN CANCER EXCISION  2000's   "off my nose" (08/13/2013)   TOTAL HIP ARTHROPLASTY Right 03/16/2016   Procedure: TOTAL HIP ARTHROPLASTY;  Surgeon: Teryl Lucy, MD;  Location: [facility] OR;  Service: Orthopedics;  Laterality: Right;   TOTAL HIP ARTHROPLASTY Left 06/09/2017   Procedure: REMOVE LEFT HIP INTRAMEDULLARY NAIL AND CONVERT TO LEFT CEMENTED HEMI HIP ARTHROPLASTY;  Surgeon: Tarry Kos, MD;  Location: [facility] OR;  Service: Orthopedics;  Laterality: Left;   TOTAL KNEE ARTHROPLASTY Right 2005   TOTAL KNEE ARTHROPLASTY  03/02/2012   Procedure: TOTAL KNEE ARTHROPLASTY;  Surgeon: Thera Flake., MD;  Location: [facility] OR;  Service: Orthopedics;  Laterality: Left;    Family History  Problem Relation Age of Onset   Arrhythmia Mother    Heart disease Mother    Stroke Father 74   Arrhythmia Sister    Heart disease Sister    Heart disease Sister    Stroke Sister    Diabetes Sister    Heart disease Brother    Heart disease Brother    Cancer Brother        Colon cancer   Arrhythmia Brother    Heart disease Brother    Heart disease Child 46   Heart disease Child 50   Breast cancer Neg Hx    Ovarian cancer Neg Hx    Endometrial cancer Neg Hx    Pancreatic cancer Neg Hx    Prostate cancer Neg Hx     Social History   Socioeconomic History   Marital status: Widowed    Spouse name: Not on file   Number of children: Not on file   Years of education: Not on file   Highest education level: Not on  file  Occupational History   Not on file  Tobacco Use   Smoking status: Never   Smokeless tobacco: Never  Vaping Use   Vaping Use: Never used  Substance and Sexual Activity   Alcohol use: No   Drug use: No   Sexual activity: Not Currently  Other Topics Concern   Not on file  Social History Narrative   Widowed. Lives at [facility]. Son died in the [place] War and she has not slept well since then.   Admitted to [facility] 01/30/17   Never smoked   Alcohol none   Full Code   Social Determinants of Health   Financial Resource Strain: Not on file  Food Insecurity: Not on file  Transportation Needs: Not on file  Physical Activity: Not on file  Stress: Not on file  Social Connections: Not on file    Current Medications:  Current Outpatient Medications:    acetaminophen (TYLENOL) 325 MG tablet, Take 650 mg by mouth every 6 (six) hours as needed  for moderate pain. , Disp: , Rfl:    Apoaequorin (PREVAGEN EXTRA STRENGTH) 20 MG CAPS, Take 1 tablet by mouth daily., Disp: , Rfl:    chlorthalidone (HYGROTON) 25 MG tablet, TAKE 1 TABLET ONCE DAILY., Disp: 90 tablet, Rfl: 2   Cholecalciferol 2000 units CAPS, Take 2,000 Units by mouth every evening., Disp: , Rfl:    diltiazem (CARDIZEM CD) 180 MG 24 hr capsule, TAKE 1 CAPSULE DAILY., Disp: 90 capsule, Rfl: 2   ELIQUIS 2.5 MG TABS tablet, take 1 tablet Orally Twice a day, Disp: 180 tablet, Rfl: 1   Flaxseed, Linseed, (FLAXSEED OIL) 1000 MG CAPS, Take 1,000 mg by mouth 2 (two) times daily. , Disp: , Rfl:    levothyroxine (SYNTHROID) 112 MCG tablet, Take 112 mcg by mouth daily before breakfast. , Disp: , Rfl:    magnesium hydroxide (MILK OF MAGNESIA) 400 MG/5ML suspension, Take 30 mLs by mouth daily as needed for mild constipation., Disp: , Rfl:    metoprolol (LOPRESSOR) 50 MG tablet, Take 50 mg by mouth 2 (two) times daily., Disp: , Rfl:    Multiple Vitamins-Minerals (PRESERVISION AREDS 2+MULTI VIT PO), Take 1 capsule by mouth 2 (two) times daily., Disp: , Rfl:    senna (SENOKOT) 8.6 MG tablet, Take 2 tablets by mouth at bedtime., Disp: , Rfl:    sulfamethoxazole-trimethoprim (BACTRIM) 400-80 MG tablet, Take 1 tablet by mouth 2 (two) times daily., Disp: 60 tablet, Rfl: 0   temazepam (RESTORIL) 30 MG capsule, Take 1 capsule (30 mg total) by mouth at bedtime as needed for sleep., Disp: 30 capsule, Rfl: 0  Review of Symptoms: Pertinent positives as per HPI.  Physical Exam: Deferred given limitations of phone visit.  Laboratory & Radiologic Studies: None new  Assessment & Plan: Leah Holland is a 89 y.o. woman with incompletely staged deeply myoinvasive uterine high-grade carcinoma with clear cell features presenting for surveillance. Surgery 10/2021. Last imaging 08/2022 - negative fore recurrent disease.   Patient is overall doing well and is asymptomatic from a cancer standpoint.    She  has been struggling after her grandson's death, feeling somewhat better now.  Appetite is improving and she is started to gain weight after losing about 8 pounds.   In terms of follow-up, we discussed continuing with visits every 3 months.  She would like to come in for a in person visit next time.  We will plan to do this on 9/13 at 4  PM.  I discussed the assessment and treatment plan with the patient. The patient was provided with an opportunity to ask questions and all were answered. The patient agreed with the plan and demonstrated an understanding of the instructions.   The patient was advised to call back or see an in-person evaluation if the symptoms worsen or if the condition fails to improve as anticipated.   8 minutes of total time was spent for this patient encounter, including preparation, phone counseling with the patient and coordination of care, and documentation of the encounter.   Eugene Garnet, MD  Division of Gynecologic Oncology  Department of Obstetrics and Gynecology  [facility]

## 2023-06-28 ENCOUNTER — Other Ambulatory Visit: Payer: Self-pay | Admitting: Infectious Disease

## 2023-06-28 NOTE — Telephone Encounter (Signed)
Please advise if okay to refill. 

## 2023-07-26 ENCOUNTER — Other Ambulatory Visit: Payer: Self-pay | Admitting: Cardiovascular Disease

## 2023-08-03 ENCOUNTER — Other Ambulatory Visit: Payer: Self-pay | Admitting: Cardiovascular Disease

## 2023-08-03 DIAGNOSIS — I4891 Unspecified atrial fibrillation: Secondary | ICD-10-CM

## 2023-08-03 NOTE — Telephone Encounter (Signed)
Pt last saw Dr Allyson Sabal 11/22/22, last labs 05/03/23 Creat 1.13, age 89, weight 53.5kg, based on specified criteria pt is on appropriate dosage of Eliquis 2.5mg  BID for afib.  Will refill rx.

## 2023-08-25 ENCOUNTER — Encounter: Payer: Self-pay | Admitting: Gynecologic Oncology

## 2023-08-25 ENCOUNTER — Inpatient Hospital Stay: Payer: Medicare Other | Attending: Gynecologic Oncology | Admitting: Gynecologic Oncology

## 2023-08-25 VITALS — BP 143/67 | HR 76 | Temp 97.8°F | Resp 18 | Wt 124.0 lb

## 2023-08-25 DIAGNOSIS — Z90710 Acquired absence of both cervix and uterus: Secondary | ICD-10-CM | POA: Diagnosis not present

## 2023-08-25 DIAGNOSIS — Z90722 Acquired absence of ovaries, bilateral: Secondary | ICD-10-CM | POA: Insufficient documentation

## 2023-08-25 DIAGNOSIS — C541 Malignant neoplasm of endometrium: Secondary | ICD-10-CM

## 2023-08-25 DIAGNOSIS — Z8542 Personal history of malignant neoplasm of other parts of uterus: Secondary | ICD-10-CM | POA: Diagnosis present

## 2023-08-25 NOTE — Patient Instructions (Signed)
Plan on following up in three months with a phone visit. Please reach out if you need anything before then or for any needs.

## 2023-08-25 NOTE — Progress Notes (Signed)
Gynecologic Oncology Return Clinic Visit  08/25/23  Reason for Visit:  surveillance   Treatment History: 06/2021: Patient seen initially for urinary incontinence. 07/01/2021: CT scan. 09/09/2021: Pelvic ultrasound shows enlarged uterus with internal mass and cystic/solid components.  Bilateral ovaries not identified, no adnexal masses appreciated. 09/24/2021: MRI A/P shows thick-walled cystic lesion in the midline pelvis, which appears to arise from the right ovary, measures 8.5 x 7.5 x 8.1 cm.  Findings highly concerning for cystic ovarian neoplasm.  No adenopathy or metastatic disease definitively noted. 11/02/2021: Robotic total hysterectomy with BSO, mini lap for specimen removal, cystoscopy.  Findings at surgery included enlarged uterus with pyometra, atrophic adnexa, retroperitoneal fibrosis and edema, significant diverticular disease, powder burn lesions on the uterus and pelvic peritoneum consistent with possible endometriosis. 11/02/2021: Final pathology reveals poorly differentiated carcinoma with clear cell features of the uterus, extends to subserosal myometrium.  Depth of invasion 95%, LVI not identified.  Cervix negative for carcinoma.  Adnexa negative for carcinoma.  Interval History: Overall doing well.  Still struggling some with the death of her grandson.  Got down to 117 pounds.  Appetite has now improved and she is back up to 124 pounds.  She continues to struggle with constipation, uses senna with good relief, MiraLAX as needed.  Denies any vaginal bleeding or discharge.  Denies any urinary symptoms.  Past Medical/Surgical History: Past Medical History:  Diagnosis Date   Anxiety attack    "take RX prn" (08/13/2013)   Arthritis    "probably" (08/13/2013)   Atrial fibrillation (HCC)    new onset/pt report 08/13/2013   Bladder wall thickening 07/01/2021   CT scan in Epic   Bronchitis 12/2011   Cholelithiasis 07/01/2021   CT in Epic.   Common bile duct dilation 07/01/2021   CT  in Epic:  13 mm.   Complication of anesthesia    Compression fracture of first lumbar vertebra (HCC) 07/01/2021   CT in Epic.   Depression    Femur fracture (HCC)    left proximal femur non-union   GERD (gastroesophageal reflux disease)    Grieving 05/03/2023   H/O hiatal hernia    Hardware complicating wound infection (HCC) 08/22/2016   History of blood transfusion    "w/both knee OR's" (08/13/2013)   Hyperlipidemia    Hyperlipidemia    Hypertension    Hypertensive urgency 10/22/2018   Hypothyroidism    takes synthroid   Insomnia    Intertrochanteric fracture of right hip (HCC) 07/30/2015   Neuromuscular disorder (HCC)    carpal tunnel in right hand   Periprosthetic fracture around internal prosthetic right hip joint (HCC), nonunion basicervical femoral neck with broken hardware and subtrochanteric fracture 03/16/2016   PONV (postoperative nausea and vomiting)    Prosthetic hip infection (HCC) 08/22/2016   Prosthetic joint infection (HCC) 05/02/2023   Proteus infection 08/22/2016   Serratia infection 11/14/2016   Shingles    Skin cancer of face 2000's   "one on each side of my face and one on my nose" (08/13/2013)   Swelling of extremity    sees Dr. Allyson Sabal, cardiologist 1 x year [phone]   Tendon dysfunction 08/22/2016   Wears hearing aid    [facility]    Past Surgical History:  Procedure Laterality Date   2D ECHOCARDIOGRAM  12/07/2004   EF 48%, moderate pulmonary hypertension   BREAST BIOPSY Left ?1970's   CARDIOVASCULAR STRESS TEST  12/07/11   CARDIOVASCULAR STRESS TEST  12/07/2011   LEXISCAN, normal scan, no significant wall  abnormalities noted   CARPAL TUNNEL RELEASE Right ~ 2005   CATARACT EXTRACTION, BILATERAL Bilateral 2000   CYSTOSCOPY  11/02/2021   Procedure: CYSTOSCOPY;  Surgeon: Carver Fila, MD;  Location: [facility];  Service: Gynecology;;   DILATION AND CURETTAGE OF UTERUS  1950's   DOPPLER ECHOCARDIOGRAPHY  12/07/11   EYE SURGERY     FEMUR IM NAIL  Right 08/01/2015   Procedure: INTRAMEDULLARY (IM) NAIL FEMORAL;  Surgeon: Teryl Lucy, MD;  Location: [facility] OR;  Service: Orthopedics;  Laterality: Right;   HARDWARE REMOVAL Right 03/16/2016   Procedure: HARDWARE REMOVAL;  Surgeon: Teryl Lucy, MD;  Location: [facility] OR;  Service: Orthopedics;  Laterality: Right;   INTRAMEDULLARY (IM) NAIL INTERTROCHANTERIC Left 01/26/2017   Procedure: INTRAMEDULLARY (IM) NAIL INTERTROCHANTRIC;  Surgeon: Tarry Kos, MD;  Location: [facility] OR;  Service: Orthopedics;  Laterality: Left;   JOINT REPLACEMENT     bilateral knee replacement   ROBOTIC ASSISTED BILATERAL SALPINGO OOPHERECTOMY Bilateral 11/02/2021   Procedure: XI ROBOTIC ASSISTED BILATERAL SALPINGO OOPHORECTOMY;  Surgeon: Carver Fila, MD;  Location: [facility];  Service: Gynecology;  Laterality: Bilateral;   ROBOTIC ASSISTED TOTAL HYSTERECTOMY N/A 11/02/2021   Procedure: XI ROBOTIC ASSISTED TOTAL HYSTERECTOMY WITH  MINI LAPAROTOMY;  Surgeon: Carver Fila, MD;  Location: [facility];  Service: Gynecology;  Laterality: N/A;   SKIN CANCER EXCISION  2000's   "off my nose" (08/13/2013)   TOTAL HIP ARTHROPLASTY Right 03/16/2016   Procedure: TOTAL HIP ARTHROPLASTY;  Surgeon: Teryl Lucy, MD;  Location: [facility] OR;  Service: Orthopedics;  Laterality: Right;   TOTAL HIP ARTHROPLASTY Left 06/09/2017   Procedure: REMOVE LEFT HIP INTRAMEDULLARY NAIL AND CONVERT TO LEFT CEMENTED HEMI HIP ARTHROPLASTY;  Surgeon: Tarry Kos, MD;  Location: [facility];  Service: Orthopedics;  Laterality: Left;   TOTAL KNEE ARTHROPLASTY Right 2005   TOTAL KNEE ARTHROPLASTY  03/02/2012   Procedure: TOTAL KNEE ARTHROPLASTY;  Surgeon: Thera Flake., MD;  Location: [facility];  Service: Orthopedics;  Laterality: Left;    Family History  Problem Relation Age of Onset   Arrhythmia Mother    Heart disease Mother    Stroke Father 71   Arrhythmia Sister    Heart disease Sister    Heart disease Sister    Stroke Sister    Diabetes Sister    Heart disease  Brother    Heart disease Brother    Cancer Brother        Colon cancer   Arrhythmia Brother    Heart disease Brother    Heart disease Child 46   Heart disease Child 87   Breast cancer Neg Hx    Ovarian cancer Neg Hx    Endometrial cancer Neg Hx    Pancreatic cancer Neg Hx    Prostate cancer Neg Hx     Social History   Socioeconomic History   Marital status: Widowed    Spouse name: Not on file   Number of children: Not on file   Years of education: Not on file   Highest education level: Not on file  Occupational History   Not on file  Tobacco Use   Smoking status: Never   Smokeless tobacco: Never  Vaping Use   Vaping status: Never Used  Substance and Sexual Activity   Alcohol use: No   Drug use: No   Sexual activity: Not Currently  Other Topics Concern   Not on file  Social History Narrative   Widowed. Lives at [facility]  [place]. Son died in the [place] War and she has not slept well since then.   Admitted to [facility] 01/30/17   Never smoked   Alcohol none   Full Code   Social Determinants of Health   Financial Resource Strain: Not on file  Food Insecurity: Not on file  Transportation Needs: Not on file  Physical Activity: Not on file  Stress: Not on file  Social Connections: Not on file    Current Medications:  Current Outpatient Medications:    acetaminophen (TYLENOL) 325 MG tablet, Take 650 mg by mouth every 6 (six) hours as needed for moderate pain. , Disp: , Rfl:    Apoaequorin (PREVAGEN EXTRA STRENGTH) 20 MG CAPS, Take 1 tablet by mouth daily., Disp: , Rfl:    chlorthalidone (HYGROTON) 25 MG tablet, TAKE 1 TABLET ONCE DAILY., Disp: 90 tablet, Rfl: 2   Cholecalciferol 2000 units CAPS, Take 2,000 Units by mouth every evening., Disp: , Rfl:    diltiazem (CARTIA XT) 180 MG 24 hr capsule, TAKE ONE CAPSULE BY MOUTH DAILY, Disp: 90 capsule, Rfl: 3   ELIQUIS 2.5 MG TABS tablet, TAKE ONE TABLET BY MOUTH TWICE DAILY., Disp: 180 tablet, Rfl: 1    Flaxseed, Linseed, (FLAXSEED OIL) 1000 MG CAPS, Take 1,000 mg by mouth 2 (two) times daily. , Disp: , Rfl:    levothyroxine (SYNTHROID) 112 MCG tablet, Take 112 mcg by mouth daily before breakfast. , Disp: , Rfl:    magnesium hydroxide (MILK OF MAGNESIA) 400 MG/5ML suspension, Take 30 mLs by mouth daily as needed for mild constipation., Disp: , Rfl:    metoprolol (LOPRESSOR) 50 MG tablet, Take 50 mg by mouth 2 (two) times daily., Disp: , Rfl:    Multiple Vitamins-Minerals (PRESERVISION AREDS 2+MULTI VIT PO), Take 1 capsule by mouth 2 (two) times daily., Disp: , Rfl:    senna (SENOKOT) 8.6 MG tablet, Take 2 tablets by mouth at bedtime., Disp: , Rfl:    sulfamethoxazole-trimethoprim (BACTRIM) 400-80 MG tablet, Take 1 tablet by mouth 2 (two) times daily., Disp: 60 tablet, Rfl: 2   temazepam (RESTORIL) 30 MG capsule, Take 1 capsule (30 mg total) by mouth at bedtime as needed for sleep., Disp: 30 capsule, Rfl: 0  Review of Systems: Denies appetite changes, fevers, chills, fatigue, unexplained weight changes. Denies hearing loss, neck lumps or masses, mouth sores, ringing in ears or voice changes. Denies cough or wheezing.  Denies shortness of breath. Denies chest pain or palpitations. Denies leg swelling. Denies abdominal distention, pain, blood in stools, diarrhea, nausea, vomiting, or early satiety. Denies pain with intercourse, dysuria, frequency, hematuria or incontinence. Denies hot flashes, pelvic pain, vaginal bleeding or vaginal discharge.   Denies joint pain, back pain or muscle pain/cramps. Denies itching, rash, or wounds. Denies dizziness, headaches, numbness or seizures. Denies swollen lymph nodes or glands, denies easy bruising or bleeding. Denies anxiety, depression, confusion, or decreased concentration.  Physical Exam: BP (!) 167/80 (BP Location: Right Arm, Patient Position: Sitting) Comment: 167/80 R side; 161/84 L side ; informed nurse Jaime  Pulse 76   Temp 97.8 F (36.6 C)    Resp 18   Wt 124 lb (56.2 kg)   SpO2 97%   BMI 21.97 kg/m  General: Alert, oriented, no acute distress. HEENT: Normocephalic, atraumatic, sclera anicteric. Chest: Clear to auscultation bilaterally.  No wheezes or rhonchi. Abdomen: soft, nontender, minimally distended.  Given laxity of abdominal wall and musculature, it is difficult to appreciate whether the patient has a mobile  mass within the central abdomen versus small bowel that I can move around.  Normoactive bowel sounds.  No hepatosplenomegaly appreciated.  Well-healed incisions. Extremities: Grossly normal range of motion.  Warm, well perfused.  No edema bilaterally. Skin: No rashes or lesions noted. Lymphatics: No cervical, supraclavicular, or inguinal adenopathy. GU: Normal appearing external genitalia without erythema, excoriation, or lesions.  Speculum exam reveals moderately atrophic vaginal mucosa, small oozing with manipulation of the speculum.  Cuff intact, no obvious tumor at the vaginal cuff.  Bimanual exam reveals cuff intact, no nodularity or masses.  Laboratory & Radiologic Studies: None new  Assessment & Plan: Leah Holland is a 89 y.o. 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  She is now almost 2 years out from surgery.   Patient is overall doing well and is asymptomatic.  She is NED on exam today.  We discussed that it is very encouraging she has been able to gain some weight more recently.   In terms of follow-up, we discussed continuing with visits every 3 months.  At this time, we will plan to alternate between phone visits and in person visits.  Her next visit will be with me by phone.  We reviewed signs and symptoms that should prompt a phone call before her next visit.  22 minutes of total time was spent for this patient encounter, including preparation, face-to-face counseling with the patient and  coordination of care, and documentation of the encounter.  Eugene Garnet, MD  Division of Gynecologic Oncology  Department of Obstetrics and Gynecology  [facility]

## 2023-09-04 ENCOUNTER — Ambulatory Visit: Payer: Medicare Other | Admitting: Infectious Disease

## 2023-10-02 ENCOUNTER — Other Ambulatory Visit: Payer: Self-pay

## 2023-10-02 MED ORDER — SULFAMETHOXAZOLE-TRIMETHOPRIM 400-80 MG PO TABS: 1.0000 | ORAL_TABLET | Freq: Two times a day (BID) | ORAL | 6 refills | Status: DC

## 2023-11-22 ENCOUNTER — Ambulatory Visit: Payer: Medicare Other | Attending: Cardiovascular Disease | Admitting: Cardiovascular Disease

## 2023-11-22 ENCOUNTER — Encounter: Payer: Self-pay | Admitting: Cardiovascular Disease

## 2023-11-22 VITALS — BP 138/62 | HR 65 | Ht 63.0 in | Wt 126.2 lb

## 2023-11-22 DIAGNOSIS — I10 Essential (primary) hypertension: Secondary | ICD-10-CM | POA: Diagnosis present

## 2023-11-22 DIAGNOSIS — I480 Paroxysmal atrial fibrillation: Secondary | ICD-10-CM | POA: Diagnosis present

## 2023-11-22 DIAGNOSIS — E782 Mixed hyperlipidemia: Secondary | ICD-10-CM | POA: Insufficient documentation

## 2023-11-22 NOTE — Progress Notes (Signed)
11/22/2023 Leah Holland   09/25/1922  [id number]  Primary Physician Leah Ladd, MD (Inactive) Primary Cardiologist: Leah Gess MD FACP, [place], [place], [place]  HPI:  Leah Holland is a 89 y.o.  widowed, Caucasian female, mother of two living children (one deceased), grandmother to five grandchildren, who I last saw 11/22/2022.Marland Kitchen She is the youngest of 10 children. Her sister, Leah Holland was also a patient i of mine that has passed away. She had one son who died , one son named Leah Holland who is now a retired Optometrist , who accompanies her today,  She has a history of hypertension and hyperlipidemia, as well as family history of heart disease. She had a remote right total knee replacement and a recent left one by Dr. Madelon Holland. She had a Myoview stress test performed in our office on August 07, 2011, which was low risk. Recent lab work performed by Dr. Yehuda Holland revealed a total cholesterol of 133, HDL of 71, and HDL of 39.  Patient presented with apparent new onset atrial fibrillation with RVR. She was [facility] on 08/03/2013 for chest pain. She was diagnosed with gastroesophageal reflux disease and a UTI. At that time she was in normal sinus rhythm at a rate of 79 beats per minute and with PACs. She reported dizziness, feeling tired and a racing HR since ~Sat/Sun. She otherwise denied nausea, vomiting, fever, chest pain, dizziness, PND, cough, congestion, abdominal pain, hematochezia, melena, lower extremity edema, claudication. No recent GIB.  She was admitted and started on IV diltiazem 5mg /hr and Eliquis 2.5mg  twice daily. She converted to NSR at 0246hrs. She was changed to PO Cardizem 120mg  daily. She's been taking lasix 60mg  daily for LEE for a long time. This was changed to 20mg  daily as needed a long with as needed potassium.    Her major issues had been with recurrent right and left hip fractures requiring surgical intervention.  She has  recuperated over the last year and is currently walking with a walker.    Her son who is the retired pediatrician feels that she may have an element of orthostatic hypotension which has led to her falls in the past and was recommended that she sit for a while before she stands up.   Since I saw her a year ago she is remained stable.    She lives at [facility].  She walks with a walker.  She does have some mild orthostasis but gets up slowly.  She denies chest pain or shortness of breath.   Current Meds  Medication Sig   acetaminophen (TYLENOL) 325 MG tablet Take 650 mg by mouth every 6 (six) hours as needed for moderate pain.    Apoaequorin (PREVAGEN EXTRA STRENGTH) 20 MG CAPS Take 1 tablet by mouth daily.   chlorthalidone (HYGROTON) 25 MG tablet TAKE 1 TABLET ONCE DAILY.   Cholecalciferol 2000 units CAPS Take 2,000 Units by mouth every evening.   diltiazem (CARTIA XT) 180 MG 24 hr capsule TAKE ONE CAPSULE BY MOUTH DAILY   ELIQUIS 2.5 MG TABS tablet TAKE ONE TABLET BY MOUTH TWICE DAILY.   Flaxseed, Linseed, (FLAXSEED OIL) 1000 MG CAPS Take 1,000 mg by mouth 2 (two) times daily.    levothyroxine (SYNTHROID) 112 MCG tablet Take 112 mcg by mouth daily before breakfast.    magnesium hydroxide (MILK OF MAGNESIA) 400 MG/5ML suspension Take 30 mLs by mouth daily as needed for mild constipation.  metoprolol (LOPRESSOR) 50 MG tablet Take 50 mg by mouth 2 (two) times daily.   Multiple Vitamins-Minerals (PRESERVISION AREDS 2+MULTI VIT PO) Take 1 capsule by mouth 2 (two) times daily.   senna (SENOKOT) 8.6 MG tablet Take 2 tablets by mouth at bedtime.   sulfamethoxazole-trimethoprim (BACTRIM) 400-80 MG tablet Take 1 tablet by mouth 2 (two) times daily.   temazepam (RESTORIL) 30 MG capsule Take 1 capsule (30 mg total) by mouth at bedtime as needed for sleep.     Allergies  Allergen Reactions   Levaquin [Levofloxacin] Other (See Comments)    tendinopathy   Morphine And Codeine Other (See Comments)     Made pt "wild"   Morphine Sulfate     Other reaction(s): made pt wild    Social History   Socioeconomic History   Marital status: Widowed    Spouse name: Not on file   Number of children: Not on file   Years of education: Not on file   Highest education level: Not on file  Occupational History   Not on file  Tobacco Use   Smoking status: Never   Smokeless tobacco: Never  Vaping Use   Vaping status: Never Used  Substance and Sexual Activity   Alcohol use: No   Drug use: No   Sexual activity: Not Currently  Other Topics Concern   Not on file  Social History Narrative   Widowed. Lives at [facility]. Son died in the [place] War and she has not slept well since then.   Admitted to [facility] 01/30/17   Never smoked   Alcohol none   Full Code   Social Determinants of Health   Financial Resource Strain: Not on file  Food Insecurity: Not on file  Transportation Needs: Not on file  Physical Activity: Not on file  Stress: Not on file  Social Connections: Not on file  Intimate Partner Violence: Not on file     Review of Systems: General: negative for chills, fever, night sweats or weight changes.  Cardiovascular: negative for chest pain, dyspnea on exertion, edema, orthopnea, palpitations, paroxysmal nocturnal dyspnea or shortness of breath Dermatological: negative for rash Respiratory: negative for cough or wheezing Urologic: negative for hematuria Abdominal: negative for nausea, vomiting, diarrhea, bright red blood per rectum, melena, or hematemesis Neurologic: negative for visual changes, syncope, or dizziness All other systems reviewed and are otherwise negative except as noted above.    Blood pressure 138/62, pulse 65, height 5\' 3"  (1.6 m), weight 126 lb 3.2 oz (57.2 kg), SpO2 93%.  General appearance: alert and no distress Neck: no adenopathy, no carotid bruit, no JVD, supple, symmetrical, trachea midline, and thyroid not enlarged, symmetric, no  tenderness/mass/nodules Lungs: clear to auscultation bilaterally Heart: irregularly irregular rhythm Extremities: extremities normal, atraumatic, no cyanosis or edema Pulses: 2+ and symmetric Skin: Skin color, texture, turgor normal. No rashes or lesions Neurologic: Grossly normal  EKG EKG Interpretation Date/Time:  Wednesday November 22 2023 13:59:55 EST Ventricular Rate:  64 PR Interval:    QRS Duration:  86 QT Interval:  428 QTC Calculation: 441 R Axis:   82  Text Interpretation: Atrial fibrillation Anteroseptal infarct (cited on or before 02-Oct-2020) When compared with ECG of 02-Oct-2020 01:57, Questionable change in initial forces of Anterior leads Confirmed by Nanetta Batty [phone]) on 11/22/2023 2:06:06 PM    ASSESSMENT AND PLAN:   Hypertension History of essential hypertension with blood pressure measured today at 138/62.  She is on Cardizem, chlorthalidone, and metoprolol.  Hyperlipidemia History of hyperlipidemia not on statin therapy followed by her PCP  Paroxysmal atrial fibrillation (HCC) History of persistent A-fib rate controlled on Eliquis oral anticoagulation (low-dose).     Leah Gess MD FACP,FACC,FAHA, [facility] 11/22/2023 2:13 PM

## 2023-11-22 NOTE — Assessment & Plan Note (Signed)
History of essential hypertension with blood pressure measured today at 138/62.  She is on Cardizem, chlorthalidone, and metoprolol.

## 2023-11-22 NOTE — Assessment & Plan Note (Signed)
History of persistent A-fib rate controlled on Eliquis oral anticoagulation (low-dose).

## 2023-11-22 NOTE — Assessment & Plan Note (Signed)
History of hyperlipidemia not on statin therapy followed by her PCP 

## 2023-11-22 NOTE — Patient Instructions (Signed)

## 2023-11-24 ENCOUNTER — Inpatient Hospital Stay: Payer: Medicare Other | Attending: Gynecologic Oncology | Admitting: Gynecologic Oncology

## 2023-11-24 DIAGNOSIS — C541 Malignant neoplasm of endometrium: Secondary | ICD-10-CM

## 2023-11-24 DIAGNOSIS — Z8542 Personal history of malignant neoplasm of other parts of uterus: Secondary | ICD-10-CM

## 2023-11-27 ENCOUNTER — Encounter: Payer: Self-pay | Admitting: Gynecologic Oncology

## 2023-11-27 ENCOUNTER — Other Ambulatory Visit: Payer: Self-pay | Admitting: Cardiovascular Disease

## 2023-11-27 DIAGNOSIS — I10 Essential (primary) hypertension: Secondary | ICD-10-CM

## 2023-11-27 NOTE — Progress Notes (Signed)
Gynecologic Oncology Telehealth Note: Gyn-Onc  I connected with Leah Holland on 11/27/23 at  4:30 PM EST by telephone and verified that I am speaking with the correct person using two identifiers.  I discussed the limitations, risks, security and privacy concerns of performing an evaluation and management service by telemedicine and the availability of in-person appointments. I also discussed with the patient that there may be a patient responsible charge related to this service. The patient expressed understanding and agreed to proceed.  Other persons participating in the visit and their role in the encounter: none.  Patient's location: [place] Provider's location: [place]  Reason for Visit: follow-up  Treatment History: 06/2021: Patient seen initially for urinary incontinence. 07/01/2021: CT scan. 09/09/2021: Pelvic ultrasound shows enlarged uterus with internal mass and cystic/solid components.  Bilateral ovaries not identified, no adnexal masses appreciated. 09/24/2021: MRI A/P shows thick-walled cystic lesion in the midline pelvis, which appears to arise from the right ovary, measures 8.5 x 7.5 x 8.1 cm.  Findings highly concerning for cystic ovarian neoplasm.  No adenopathy or metastatic disease definitively noted. 11/02/2021: Robotic total hysterectomy with BSO, mini lap for specimen removal, cystoscopy.  Findings at surgery included enlarged uterus with pyometra, atrophic adnexa, retroperitoneal fibrosis and edema, significant diverticular disease, powder burn lesions on the uterus and pelvic peritoneum consistent with possible endometriosis. 11/02/2021: Final pathology reveals poorly differentiated carcinoma with clear cell features of the uterus, extends to subserosal myometrium.  Depth of invasion 95%, LVI not identified.  Cervix negative for carcinoma.  Adnexa negative for carcinoma.  Interval History: Doing very well. Denies any vaginal bleeding or abdominal/pelvic pain. Reports baseline  bowel and bladder function.   Past Medical/Surgical History: Past Medical History:  Diagnosis Date   Anxiety attack    "take RX prn" (08/13/2013)   Arthritis    "probably" (08/13/2013)   Atrial fibrillation (HCC)    new onset/pt report 08/13/2013   Bladder wall thickening 07/01/2021   CT scan in Epic   Bronchitis 12/2011   Cholelithiasis 07/01/2021   CT in Epic.   Common bile duct dilation 07/01/2021   CT in Epic:  13 mm.   Complication of anesthesia    Compression fracture of first lumbar vertebra (HCC) 07/01/2021   CT in Epic.   Depression    Femur fracture (HCC)    left proximal femur non-union   GERD (gastroesophageal reflux disease)    Grieving 05/03/2023   H/O hiatal hernia    Hardware complicating wound infection (HCC) 08/22/2016   History of blood transfusion    "w/both knee OR's" (08/13/2013)   Hyperlipidemia    Hyperlipidemia    Hypertension    Hypertensive urgency 10/22/2018   Hypothyroidism    takes synthroid   Insomnia    Intertrochanteric fracture of right hip (HCC) 07/30/2015   Neuromuscular disorder (HCC)    carpal tunnel in right hand   Periprosthetic fracture around internal prosthetic right hip joint (HCC), nonunion basicervical femoral neck with broken hardware and subtrochanteric fracture 03/16/2016   PONV (postoperative nausea and vomiting)    Prosthetic hip infection (HCC) 08/22/2016   Prosthetic joint infection (HCC) 05/02/2023   Proteus infection 08/22/2016   Serratia infection 11/14/2016   Shingles    Skin cancer of face 2000's   "one on each side of my face and one on my nose" (08/13/2013)   Swelling of extremity    sees Dr. Allyson Sabal, cardiologist 1 x year [phone]   Tendon dysfunction 08/22/2016   Wears hearing aid  HOH    Past Surgical History:  Procedure Laterality Date   2D ECHOCARDIOGRAM  12/07/2004   EF 48%, moderate pulmonary hypertension   BREAST BIOPSY Left ?1970's   CARDIOVASCULAR STRESS TEST  12/07/11   CARDIOVASCULAR STRESS  TEST  12/07/2011   LEXISCAN, normal scan, no significant wall abnormalities noted   CARPAL TUNNEL RELEASE Right ~ 2005   CATARACT EXTRACTION, BILATERAL Bilateral 2000   CYSTOSCOPY  11/02/2021   Procedure: CYSTOSCOPY;  Surgeon: Carver Fila, MD;  Location: [facility];  Service: Gynecology;;   DILATION AND CURETTAGE OF UTERUS  1950's   DOPPLER ECHOCARDIOGRAPHY  12/07/11   EYE SURGERY     FEMUR IM NAIL Right 08/01/2015   Procedure: INTRAMEDULLARY (IM) NAIL FEMORAL;  Surgeon: Teryl Lucy, MD;  Location: [facility];  Service: Orthopedics;  Laterality: Right;   HARDWARE REMOVAL Right 03/16/2016   Procedure: HARDWARE REMOVAL;  Surgeon: Teryl Lucy, MD;  Location: [facility] OR;  Service: Orthopedics;  Laterality: Right;   INTRAMEDULLARY (IM) NAIL INTERTROCHANTERIC Left 01/26/2017   Procedure: INTRAMEDULLARY (IM) NAIL INTERTROCHANTRIC;  Surgeon: Tarry Kos, MD;  Location: [facility] OR;  Service: Orthopedics;  Laterality: Left;   JOINT REPLACEMENT     bilateral knee replacement   ROBOTIC ASSISTED BILATERAL SALPINGO OOPHERECTOMY Bilateral 11/02/2021   Procedure: XI ROBOTIC ASSISTED BILATERAL SALPINGO OOPHORECTOMY;  Surgeon: Carver Fila, MD;  Location: [facility];  Service: Gynecology;  Laterality: Bilateral;   ROBOTIC ASSISTED TOTAL HYSTERECTOMY N/A 11/02/2021   Procedure: XI ROBOTIC ASSISTED TOTAL HYSTERECTOMY WITH  MINI LAPAROTOMY;  Surgeon: Carver Fila, MD;  Location: [facility];  Service: Gynecology;  Laterality: N/A;   SKIN CANCER EXCISION  2000's   "off my nose" (08/13/2013)   TOTAL HIP ARTHROPLASTY Right 03/16/2016   Procedure: TOTAL HIP ARTHROPLASTY;  Surgeon: Teryl Lucy, MD;  Location: [facility];  Service: Orthopedics;  Laterality: Right;   TOTAL HIP ARTHROPLASTY Left 06/09/2017   Procedure: REMOVE LEFT HIP INTRAMEDULLARY NAIL AND CONVERT TO LEFT CEMENTED HEMI HIP ARTHROPLASTY;  Surgeon: Tarry Kos, MD;  Location: [facility];  Service: Orthopedics;  Laterality: Left;   TOTAL KNEE ARTHROPLASTY Right 2005    TOTAL KNEE ARTHROPLASTY  03/02/2012   Procedure: TOTAL KNEE ARTHROPLASTY;  Surgeon: Thera Flake., MD;  Location: [facility];  Service: Orthopedics;  Laterality: Left;    Family History  Problem Relation Age of Onset   Arrhythmia Mother    Heart disease Mother    Stroke Father 55   Arrhythmia Sister    Heart disease Sister    Heart disease Sister    Stroke Sister    Diabetes Sister    Heart disease Brother    Heart disease Brother    Cancer Brother        Colon cancer   Arrhythmia Brother    Heart disease Brother    Heart disease Child 46   Heart disease Child 66   Breast cancer Neg Hx    Ovarian cancer Neg Hx    Endometrial cancer Neg Hx    Pancreatic cancer Neg Hx    Prostate cancer Neg Hx     Social History   Socioeconomic History   Marital status: Widowed    Spouse name: Not on file   Number of children: Not on file   Years of education: Not on file   Highest education level: Not on file  Occupational History   Not on file  Tobacco Use   Smoking status: Never   Smokeless  tobacco: Never  Vaping Use   Vaping status: Never Used  Substance and Sexual Activity   Alcohol use: No   Drug use: No   Sexual activity: Not Currently  Other Topics Concern   Not on file  Social History Narrative   Widowed. Lives at [facility]. Son died in the [place] War and she has not slept well since then.   Admitted to [facility] 01/30/17   Never smoked   Alcohol none   Full Code   Social Drivers of Health   Financial Resource Strain: Not on file  Food Insecurity: Not on file  Transportation Needs: Not on file  Physical Activity: Not on file  Stress: Not on file  Social Connections: Not on file    Current Medications:  Current Outpatient Medications:    acetaminophen (TYLENOL) 325 MG tablet, Take 650 mg by mouth every 6 (six) hours as needed for moderate pain. , Disp: , Rfl:    Apoaequorin (PREVAGEN EXTRA STRENGTH) 20 MG CAPS, Take 1 tablet by mouth  daily., Disp: , Rfl:    chlorthalidone (HYGROTON) 25 MG tablet, TAKE 1 TABLET ONCE DAILY., Disp: 90 tablet, Rfl: 2   Cholecalciferol 2000 units CAPS, Take 2,000 Units by mouth every evening., Disp: , Rfl:    diltiazem (CARTIA XT) 180 MG 24 hr capsule, TAKE ONE CAPSULE BY MOUTH DAILY, Disp: 90 capsule, Rfl: 3   ELIQUIS 2.5 MG TABS tablet, TAKE ONE TABLET BY MOUTH TWICE DAILY., Disp: 180 tablet, Rfl: 1   Flaxseed, Linseed, (FLAXSEED OIL) 1000 MG CAPS, Take 1,000 mg by mouth 2 (two) times daily. , Disp: , Rfl:    levothyroxine (SYNTHROID) 112 MCG tablet, Take 112 mcg by mouth daily before breakfast. , Disp: , Rfl:    magnesium hydroxide (MILK OF MAGNESIA) 400 MG/5ML suspension, Take 30 mLs by mouth daily as needed for mild constipation., Disp: , Rfl:    metoprolol (LOPRESSOR) 50 MG tablet, Take 50 mg by mouth 2 (two) times daily., Disp: , Rfl:    Multiple Vitamins-Minerals (PRESERVISION AREDS 2+MULTI VIT PO), Take 1 capsule by mouth 2 (two) times daily., Disp: , Rfl:    senna (SENOKOT) 8.6 MG tablet, Take 2 tablets by mouth at bedtime., Disp: , Rfl:    sulfamethoxazole-trimethoprim (BACTRIM) 400-80 MG tablet, Take 1 tablet by mouth 2 (two) times daily., Disp: 60 tablet, Rfl: 6   temazepam (RESTORIL) 30 MG capsule, Take 1 capsule (30 mg total) by mouth at bedtime as needed for sleep., Disp: 30 capsule, Rfl: 0  Review of Symptoms: Pertinent positives as per HPI.  Physical Exam: Deferred given limitations of phone visit.  Laboratory & Radiologic Studies: None new  Assessment & Plan: Leah Holland is a 89 y.o. 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   Surgery 10/2021.  Continues to do well without symptoms concerning for recurrence. Discussed follow-up plan. She has some interest in discontinuing follow-up with me. We discussed this option versus spacing out visits to every 6-12 months. Her  preference was to have a phone visit in 6 months. She will call me if anything changes prior to that visit.   I discussed the assessment and treatment plan with the patient. The patient was provided with an opportunity to ask questions and all were answered. The patient agreed with the plan and demonstrated an understanding of the instructions.   The patient was advised to call back or see  an in-person evaluation if the symptoms worsen or if the condition fails to improve as anticipated.   8 minutes of total time was spent for this patient encounter, including preparation, phone counseling with the patient and coordination of care, and documentation of the encounter.   Eugene Garnet, MD  Division of Gynecologic Oncology  Department of Obstetrics and Gynecology  [facility]

## 2024-01-31 ENCOUNTER — Other Ambulatory Visit: Payer: Self-pay | Admitting: Cardiovascular Disease

## 2024-01-31 DIAGNOSIS — I4891 Unspecified atrial fibrillation: Secondary | ICD-10-CM

## 2024-01-31 NOTE — Telephone Encounter (Signed)
Prescription refill request for Eliquis received. Indication: Afib  Last office visit: 11/22/23 Allyson Sabal)  Scr: 1.13 (05/03/23)  Age: 89  Weight: 57.5kg  Appropriate dose. Refill sent.

## 2024-02-02 ENCOUNTER — Ambulatory Visit: Payer: Medicare Other | Admitting: Orthopaedic Surgery

## 2024-02-02 ENCOUNTER — Other Ambulatory Visit (INDEPENDENT_AMBULATORY_CARE_PROVIDER_SITE_OTHER): Payer: Medicare Other

## 2024-02-02 DIAGNOSIS — M25552 Pain in left hip: Secondary | ICD-10-CM | POA: Diagnosis not present

## 2024-02-02 DIAGNOSIS — M25562 Pain in left knee: Secondary | ICD-10-CM | POA: Diagnosis not present

## 2024-02-02 NOTE — Progress Notes (Addendum)
Office Visit Note   Patient: Leah Holland           Date of Birth: 1922/11/24           MRN: [id number] Visit Date: 02/02/2024              Requested by: No referring provider defined for this encounter. PCP: Ileana Ladd, MD (Inactive)   Assessment & Plan: Visit Diagnoses:  1. Pain in left hip   2. Acute pain of left knee     Plan: [name] is a 89 year old female with recent fall and left knee pain.  We will obtain a CT scan to rule out fracture.  Not concerned about the hip area and I do not see any evidence of a periprosthetic fracture.  I pulled up her x-rays from 3 years ago and these are completely unchanged from today's x-rays.  Follow-Up Instructions: No follow-ups on file.   Orders:  Orders Placed This Encounter  Procedures  . XR HIP UNILAT W OR W/O PELVIS 2-3 VIEWS LEFT  . XR FEMUR MIN 2 VIEWS LEFT  . CT KNEE LEFT WO CONTRAST   No orders of the defined types were placed in this encounter.     Procedures: No procedures performed   Clinical Data: No additional findings.   Subjective: Chief Complaint  Patient presents with  . Left Hip - Pain    HPI Leah Holland comes in today for evaluation of left lower extremity pain.  She had a fall yesterday at home and [facility].  She was told that she had a periprosthetic hip fracture.  She underwent conversion from an IM hip nail to a cemented Hemi about 6-1/2 years ago.  She has been doing well since the surgery.  She is here with her 2 sons.  She is mainly reporting distal thigh and upper knee pain. Review of Systems  Constitutional: Negative.   HENT: Negative.    Eyes: Negative.   Respiratory: Negative.    Cardiovascular: Negative.   Endocrine: Negative.   Musculoskeletal: Negative.   Neurological: Negative.   Hematological: Negative.   Psychiatric/Behavioral: Negative.    All other systems reviewed and are negative.    Objective: Vital Signs: There were no  vitals taken for this visit.  Physical Exam Vitals and nursing note reviewed.  Constitutional:      Appearance: She is well-developed.  HENT:     Head: Atraumatic.     Nose: Nose normal.  Eyes:     Extraocular Movements: Extraocular movements intact.  Cardiovascular:     Pulses: Normal pulses.  Pulmonary:     Effort: Pulmonary effort is normal.  Abdominal:     Palpations: Abdomen is soft.  Musculoskeletal:     Cervical back: Neck supple.  Skin:    General: Skin is warm.     Capillary Refill: Capillary refill takes less than 2 seconds.  Neurological:     Mental Status: She is alert. Mental status is at baseline.  Psychiatric:        Behavior: Behavior normal.        Thought Content: Thought content normal.        Judgment: Judgment normal.    Ortho Exam Examination of the left lower extremity shows tenderness around the supracondylar femur region.  Extensor function is intact.  There is no joint effusion.  Previous surgical scar is fully healed.  Collaterals are stable.  I cannot elicit any pain  from the hip area from palpation rotation or from pulling traction.  There is no bruising around the hip area. Specialty Comments:  No specialty comments available.  Imaging: XR FEMUR MIN 2 VIEWS LEFT Result Date: 02/02/2024 X-rays of the left femur shows slight irregularity to the lateral cortex in the supracondylar region.  Questionable fracture.  The implant looks good.  XR HIP UNILAT W OR W/O PELVIS 2-3 VIEWS LEFT Result Date: 02/02/2024 X-rays of the left hip show a cemented hemiarthroplasty without any interval changes compared to 3 years ago.    PMFS History: Patient Active Problem List   Diagnosis Date Noted  . Grieving 05/03/2023  . Complicated grief 05/03/2023  . Prosthetic joint infection (HCC) 05/02/2023  . Mild cognitive impairment 11/16/2022  . Flatulence 11/16/2022  . Palliative care encounter 03/08/2022  . Stress incontinence 03/08/2022  . Endometrial  carcinoma (HCC) 02/28/2022  . Pelvic mass in female 11/02/2021  . Aortic atherosclerosis (HCC) 09/30/2021  . Pelvic mass 09/29/2021  . Dyspnea on exertion 02/12/2021  . Hypophosphatemia 10/07/2020  . Hypomagnesemia 10/07/2020  . Hypokalemia 10/07/2020  . Periprosthetic fracture around internal prosthetic left hip joint (HCC) 10/03/2020  . Leg fracture, left, closed, initial encounter 10/01/2020  . Hypertensive urgency 10/22/2018  . Chronic low back pain 12/21/2017  . Chronic antibiotic suppression 06/13/2017  . History of hip replacement 06/09/2017  . Ecchymosis 02/23/2017  . Closed intertrochanteric fracture with nonunion, left 01/26/2017  . Leukocytosis 01/26/2017  . Fall 01/26/2017  . Prosthetic hip infection (HCC) 08/22/2016  . Tendinopathy 08/22/2016  . Depression, recurrent (HCC) 04/07/2016  . Arthritis of knee, degenerative 04/07/2016  . OP (osteoporosis) 04/07/2016  . 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 Chronic anticoagulation 08/06/2015  . Bipolar disorder (HCC) 08/04/2015  . Hyponatremia 08/04/2015  . Constipation 08/04/2015  . Complication of anesthesia 07/30/2015  . Diabetes mellitus type 2, diet-controlled (HCC)   . Skin cancer of face   . Paroxysmal atrial fibrillation (HCC) 09/01/2014  . Right carotid bruit 11/18/2013  . Postoperative anemia due to acute blood loss 03/03/2012  . Hypertension   . Hyperlipidemia   . Swelling of extremity   . Cataract   . Hypothyroidism   . GERD (gastroesophageal reflux disease)   . Arthritis   . Insomnia    Past Medical History:  Diagnosis Date  . Anxiety attack    "take RX prn" (08/13/2013)  . Arthritis    "probably" (08/13/2013)  . Atrial fibrillation (HCC)    new onset/pt report 08/13/2013  . Bladder wall thickening 07/01/2021   CT scan in Epic  . Bronchitis 12/2011  . Cholelithiasis 07/01/2021   CT in Epic.  . Common bile duct dilation 07/01/2021   CT in Epic:  13 mm.  . Complication of anesthesia   .  Compression fracture of first lumbar vertebra (HCC) 07/01/2021   CT in Epic.  . Depression   . Femur fracture (HCC)    left proximal femur non-union  . GERD (gastroesophageal reflux disease)   . Grieving 05/03/2023  . H/O hiatal hernia   . Hardware complicating wound infection (HCC) 08/22/2016  . History of blood transfusion    "w/both knee OR's" (08/13/2013)  . Hyperlipidemia   . Hyperlipidemia   . Hypertension   . Hypertensive urgency 10/22/2018  . Hypothyroidism    takes synthroid  . Insomnia   . Intertrochanteric fracture of right hip (HCC) 07/30/2015  . Neuromuscular disorder (HCC)    carpal tunnel in right hand  .  Periprosthetic fracture around internal prosthetic right hip joint (HCC), nonunion basicervical femoral neck with broken hardware and subtrochanteric fracture 03/16/2016  . PONV (postoperative nausea and vomiting)   . Prosthetic hip infection (HCC) 08/22/2016  . Prosthetic joint infection (HCC) 05/02/2023  . Proteus infection 08/22/2016  . Serratia infection 11/14/2016  . Shingles   . Skin cancer of face 2000's   "one on each side of my face and one on my nose" (08/13/2013)  . Swelling of extremity    sees Dr. Allyson Sabal, cardiologist 1 x year [phone]  . Tendon dysfunction 08/22/2016  . Wears hearing aid    HOH    Family History  Problem Relation Age of Onset  . Arrhythmia Mother   . Heart disease Mother   . Stroke Father 63  . Arrhythmia Sister   . Heart disease Sister   . Heart disease Sister   . Stroke Sister   . Diabetes Sister   . Heart disease Brother   . Heart disease Brother   . Cancer Brother        Colon cancer  . Arrhythmia Brother   . Heart disease Brother   . Heart disease Child 46  . Heart disease Child 43  . Breast cancer Neg Hx   . Ovarian cancer Neg Hx   . Endometrial cancer Neg Hx   . Pancreatic cancer Neg Hx   . Prostate cancer Neg Hx     Past Surgical History:  Procedure Laterality Date  . 2D ECHOCARDIOGRAM  12/07/2004    EF 48%, moderate pulmonary hypertension  . BREAST BIOPSY Left ?1970's  . CARDIOVASCULAR STRESS TEST  12/07/11  . CARDIOVASCULAR STRESS TEST  12/07/2011   LEXISCAN, normal scan, no significant wall abnormalities noted  . CARPAL TUNNEL RELEASE Right ~ 2005  . CATARACT EXTRACTION, BILATERAL Bilateral 2000  . CYSTOSCOPY  11/02/2021   Procedure: CYSTOSCOPY;  Surgeon: Carver Fila, MD;  Location: [facility];  Service: Gynecology;;  . DILATION AND CURETTAGE OF UTERUS  1950's  . DOPPLER ECHOCARDIOGRAPHY  12/07/11  . EYE SURGERY    . FEMUR IM NAIL Right 08/01/2015   Procedure: INTRAMEDULLARY (IM) NAIL FEMORAL;  Surgeon: Teryl Lucy, MD;  Location: [facility];  Service: Orthopedics;  Laterality: Right;  . HARDWARE REMOVAL Right 03/16/2016   Procedure: HARDWARE REMOVAL;  Surgeon: Teryl Lucy, MD;  Location: [facility] OR;  Service: Orthopedics;  Laterality: Right;  . INTRAMEDULLARY (IM) NAIL INTERTROCHANTERIC Left 01/26/2017   Procedure: INTRAMEDULLARY (IM) NAIL INTERTROCHANTRIC;  Surgeon: Tarry Kos, MD;  Location: [facility];  Service: Orthopedics;  Laterality: Left;  . JOINT REPLACEMENT     bilateral knee replacement  . ROBOTIC ASSISTED BILATERAL SALPINGO OOPHERECTOMY Bilateral 11/02/2021   Procedure: XI ROBOTIC ASSISTED BILATERAL SALPINGO OOPHORECTOMY;  Surgeon: Carver Fila, MD;  Location: [facility];  Service: Gynecology;  Laterality: Bilateral;  . ROBOTIC ASSISTED TOTAL HYSTERECTOMY N/A 11/02/2021   Procedure: XI ROBOTIC ASSISTED TOTAL HYSTERECTOMY WITH  MINI LAPAROTOMY;  Surgeon: Carver Fila, MD;  Location: [facility];  Service: Gynecology;  Laterality: N/A;  . SKIN CANCER EXCISION  2000's   "off my nose" (08/13/2013)  . TOTAL HIP ARTHROPLASTY Right 03/16/2016   Procedure: TOTAL HIP ARTHROPLASTY;  Surgeon: Teryl Lucy, MD;  Location: [facility];  Service: Orthopedics;  Laterality: Right;  . TOTAL HIP ARTHROPLASTY Left 06/09/2017   Procedure: REMOVE LEFT HIP INTRAMEDULLARY NAIL AND CONVERT TO LEFT  CEMENTED HEMI HIP ARTHROPLASTY;  Surgeon: Tarry Kos, MD;  Location: [facility];  Service: Orthopedics;  Laterality: Left;  . TOTAL KNEE ARTHROPLASTY Right 2005  . TOTAL KNEE ARTHROPLASTY  03/02/2012   Procedure: TOTAL KNEE ARTHROPLASTY;  Surgeon: Thera Flake., MD;  Location: [facility];  Service: Orthopedics;  Laterality: Left;   Social History   Occupational History  . Not on file  Tobacco Use  . Smoking status: Never  . Smokeless tobacco: Never  Vaping Use  . Vaping status: Never Used  Substance and Sexual Activity  . Alcohol use: No  . Drug use: No  . Sexual activity: Not Currently

## 2024-02-03 ENCOUNTER — Ambulatory Visit
Admission: RE | Admit: 2024-02-03 | Discharge: 2024-02-03 | Disposition: A | Discharge status: Home / Self Care | Payer: Medicare Other | Source: Ambulatory Visit | Attending: Orthopaedic Surgery | Admitting: Orthopaedic Surgery

## 2024-02-03 DIAGNOSIS — M25552 Pain in left hip: Secondary | ICD-10-CM

## 2024-02-03 DIAGNOSIS — M25562 Pain in left knee: Secondary | ICD-10-CM

## 2024-02-05 ENCOUNTER — Encounter: Payer: Self-pay | Admitting: Orthopaedic Surgery

## 2024-05-02 ENCOUNTER — Other Ambulatory Visit: Payer: Self-pay | Admitting: Infectious Disease

## 2024-05-02 NOTE — Telephone Encounter (Signed)
 Okay to refill? Will also see if front desk can get her scheduled for follow up.

## 2024-05-09 ENCOUNTER — Telehealth: Payer: Self-pay

## 2024-05-09 NOTE — Telephone Encounter (Signed)
 Left patient a voice mail to call back to schedule follow up with Dr. Ernie Heal.

## 2024-05-14 ENCOUNTER — Telehealth: Payer: Self-pay

## 2024-05-14 NOTE — Telephone Encounter (Signed)
 Was able to reach patient, she will have her son call back to schedule a follow up with Dr. Ernie Heal due to transportation.

## 2024-05-17 ENCOUNTER — Encounter: Payer: Self-pay | Admitting: Gynecologic Oncology

## 2024-05-17 ENCOUNTER — Inpatient Hospital Stay: Payer: Medicare Other | Attending: Gynecologic Oncology | Admitting: Gynecologic Oncology

## 2024-05-17 DIAGNOSIS — C541 Malignant neoplasm of endometrium: Secondary | ICD-10-CM | POA: Diagnosis not present

## 2024-05-17 NOTE — Progress Notes (Signed)
 Gynecologic Oncology Telehealth Note: Gyn-Onc  I connected with Leah Holland on 05/17/24 at  6:00 PM EDT by telephone and verified that I am speaking with the correct person using two identifiers.  I discussed the limitations, risks, security and privacy concerns of performing an evaluation and management service by telemedicine and the availability of in-person appointments. I also discussed with the patient that there may be a patient responsible charge related to this service. The patient expressed understanding and agreed to proceed.  Other persons participating in the visit and their role in the encounter: none.   Patient's location: [place] Provider's location:    Reason for Visit: follow-up   Treatment History: 06/2021: Patient seen initially for urinary incontinence. 07/01/2021: CT scan. 09/09/2021: Pelvic ultrasound shows enlarged uterus with internal mass and cystic/solid components.  Bilateral ovaries not identified, no adnexal masses appreciated. 09/24/2021: MRI A/P shows thick-walled cystic lesion in the midline pelvis, which appears to arise from the right ovary, measures 8.5 x 7.5 x 8.1 cm.  Findings highly concerning for cystic ovarian neoplasm.  No adenopathy or metastatic disease definitively noted. 11/02/2021: Robotic total hysterectomy with BSO, mini lap for specimen removal, cystoscopy.  Findings at surgery included enlarged uterus with pyometra, atrophic adnexa, retroperitoneal fibrosis and edema, significant diverticular disease, powder burn lesions on the uterus and pelvic peritoneum consistent with possible endometriosis. 11/02/2021: Final pathology reveals poorly differentiated carcinoma with clear cell features of the uterus, extends to subserosal myometrium.  Depth of invasion 95%, LVI not identified.  Cervix negative for carcinoma.  Adnexa negative for carcinoma.   Interval History: Doing very well. Denies any vaginal bleeding or abdominal/pelvic pain. Reports  baseline bowel (still struggles with constipation despite medication) and bladder function.   Past Medical/Surgical History: Past Medical History:  Diagnosis Date   Anxiety attack    "take RX prn" (08/13/2013)   Arthritis    "probably" (08/13/2013)   Atrial fibrillation (HCC)    new onset/pt report 08/13/2013   Bladder wall thickening 07/01/2021   CT scan in Epic   Bronchitis 12/2011   Cholelithiasis 07/01/2021   CT in Epic.   Common bile duct dilation 07/01/2021   CT in Epic:  13 mm.   Complication of anesthesia    Compression fracture of first lumbar vertebra (HCC) 07/01/2021   CT in Epic.   Depression    Femur fracture (HCC)    left proximal femur non-union   GERD (gastroesophageal reflux disease)    Grieving 05/03/2023   H/O hiatal hernia    Hardware complicating wound infection (HCC) 08/22/2016   History of blood transfusion    "w/both knee OR's" (08/13/2013)   Hyperlipidemia    Hyperlipidemia    Hypertension    Hypertensive urgency 10/22/2018   Hypothyroidism    takes synthroid    Insomnia    Intertrochanteric fracture of right hip (HCC) 07/30/2015   Neuromuscular disorder (HCC)    carpal tunnel in right hand   Periprosthetic fracture around internal prosthetic right hip joint (HCC), nonunion basicervical femoral neck with broken hardware and subtrochanteric fracture 03/16/2016   PONV (postoperative nausea and vomiting)    Prosthetic hip infection (HCC) 08/22/2016   Prosthetic joint infection (HCC) 05/02/2023   Proteus infection 08/22/2016   Serratia infection 11/14/2016   Shingles    Skin cancer of face 2000's   "one on each side of my face and one on my nose" (08/13/2013)   Swelling of extremity    sees Dr. Katheryne Pane, cardiologist 1 x year [phone]  Tendon dysfunction 08/22/2016   Wears hearing aid    [facility]    Past Surgical History:  Procedure Laterality Date   2D ECHOCARDIOGRAM  12/07/2004   EF 48%, moderate pulmonary hypertension   BREAST BIOPSY Left ?1970's    CARDIOVASCULAR STRESS TEST  12/07/11   CARDIOVASCULAR STRESS TEST  12/07/2011   LEXISCAN , normal scan, no significant wall abnormalities noted   CARPAL TUNNEL RELEASE Right ~ 2005   CATARACT EXTRACTION, BILATERAL Bilateral 2000   CYSTOSCOPY  11/02/2021   Procedure: CYSTOSCOPY;  Surgeon: Suzi Essex, MD;  Location: [facility];  Service: Gynecology;;   DILATION AND CURETTAGE OF UTERUS  1950's   DOPPLER ECHOCARDIOGRAPHY  12/07/11   EYE SURGERY     FEMUR IM NAIL Right 08/01/2015   Procedure: INTRAMEDULLARY (IM) NAIL FEMORAL;  Surgeon: Osa Blase, MD;  Location: [facility] OR;  Service: Orthopedics;  Laterality: Right;   HARDWARE REMOVAL Right 03/16/2016   Procedure: HARDWARE REMOVAL;  Surgeon: Osa Blase, MD;  Location: [facility] OR;  Service: Orthopedics;  Laterality: Right;   INTRAMEDULLARY (IM) NAIL INTERTROCHANTERIC Left 01/26/2017   Procedure: INTRAMEDULLARY (IM) NAIL INTERTROCHANTRIC;  Surgeon: Wes Hamman, MD;  Location: [facility];  Service: Orthopedics;  Laterality: Left;   JOINT REPLACEMENT     bilateral knee replacement   ROBOTIC ASSISTED BILATERAL SALPINGO OOPHERECTOMY Bilateral 11/02/2021   Procedure: XI ROBOTIC ASSISTED BILATERAL SALPINGO OOPHORECTOMY;  Surgeon: Suzi Essex, MD;  Location: [facility];  Service: Gynecology;  Laterality: Bilateral;   ROBOTIC ASSISTED TOTAL HYSTERECTOMY N/A 11/02/2021   Procedure: XI ROBOTIC ASSISTED TOTAL HYSTERECTOMY WITH  MINI LAPAROTOMY;  Surgeon: Suzi Essex, MD;  [facility]: [facility];  Service: Gynecology;  Laterality: N/A;   SKIN CANCER EXCISION  2000's   "off my nose" (08/13/2013)   TOTAL HIP ARTHROPLASTY Right 03/16/2016   Procedure: TOTAL HIP ARTHROPLASTY;  Surgeon: Osa Blase, MD;  [facility];  Service: Orthopedics;  Laterality: Right;   TOTAL HIP ARTHROPLASTY Left 06/09/2017   Procedure: REMOVE LEFT HIP INTRAMEDULLARY NAIL AND CONVERT TO LEFT CEMENTED HEMI HIP ARTHROPLASTY;  Surgeon: Wes Hamman, MD;  [facility];  Service:  Orthopedics;  Laterality: Left;   TOTAL KNEE ARTHROPLASTY Right 2005   TOTAL KNEE ARTHROPLASTY  03/02/2012   Procedure: TOTAL KNEE ARTHROPLASTY;  Surgeon: Forbes Ida., MD;  Location: [facility];  Service: Orthopedics;  Laterality: Left;    Family History  Problem Relation Age of Onset   Arrhythmia Mother    Heart disease Mother    Stroke Father 87   Arrhythmia Sister    Heart disease Sister    Heart disease Sister    Stroke Sister    Diabetes Sister    Heart disease Brother    Heart disease Brother    Cancer Brother        Colon cancer   Arrhythmia Brother    Heart disease Brother    Heart disease Child 46   Heart disease Child 31   Breast cancer Neg Hx    Ovarian cancer Neg Hx    Endometrial cancer Neg Hx    Pancreatic cancer Neg Hx    Prostate cancer Neg Hx     Social History   Socioeconomic History   Marital status: Widowed    Spouse name: Not on file   Number of children: Not on file   Years of education: Not on file   Highest education level: Not on file  Occupational History   Not on file  Tobacco Use   Smoking status: Never   Smokeless tobacco: Never  Vaping Use   Vaping status: Never Used  Substance and Sexual Activity   Alcohol use: No   Drug use: No   Sexual activity: Not Currently  Other Topics Concern   Not on file  Social History Narrative   Widowed. Lives at [facility]. Son died in the [place] War and she has not slept well since then.   Admitted to [facility] 01/30/17   Never smoked   Alcohol none   Full Code   Social Drivers of Health   Financial Resource Strain: Not on file  Food Insecurity: Not on file  Transportation Needs: Not on file  Physical Activity: Not on file  Stress: Not on file  Social Connections: Not on file    Current Medications:  Current Outpatient Medications:    acetaminophen  (TYLENOL ) 325 MG tablet, Take 650 mg by mouth every 6 (six) hours as needed for moderate pain. , Disp: , Rfl:     Apoaequorin (PREVAGEN EXTRA STRENGTH) 20 MG CAPS, Take 1 tablet by mouth daily., Disp: , Rfl:    chlorthalidone  (HYGROTON ) 25 MG tablet, TAKE 1 TABLET ONCE DAILY., Disp: 90 tablet, Rfl: 2   Cholecalciferol  2000 units CAPS, Take 2,000 Units by mouth every evening., Disp: , Rfl:    diltiazem  (CARTIA  XT) 180 MG 24 hr capsule, TAKE ONE CAPSULE BY MOUTH DAILY, Disp: 90 capsule, Rfl: 3   ELIQUIS  2.5 MG TABS tablet, TAKE ONE TABLET BY MOUTH TWICE DAILY., Disp: 180 tablet, Rfl: 1   Flaxseed, Linseed, (FLAXSEED OIL) 1000 MG CAPS, Take 1,000 mg by mouth 2 (two) times daily. , Disp: , Rfl:    levothyroxine  (SYNTHROID ) 112 MCG tablet, Take 112 mcg by mouth daily before breakfast. , Disp: , Rfl:    magnesium  hydroxide (MILK OF MAGNESIA) 400 MG/5ML suspension, Take 30 mLs by mouth daily as needed for mild constipation., Disp: , Rfl:    metoprolol  tartrate (LOPRESSOR ) 50 MG tablet, take 1 tablet with food Orally Twice a day 90 days, Disp: 180 tablet, Rfl: 3   Multiple Vitamins-Minerals (PRESERVISION AREDS 2+MULTI VIT PO), Take 1 capsule by mouth 2 (two) times daily., Disp: , Rfl:    senna (SENOKOT) 8.6 MG tablet, Take 2 tablets by mouth at bedtime., Disp: , Rfl:    sulfamethoxazole -trimethoprim  (BACTRIM ) 400-80 MG tablet, TAKE ONE TABLET BY MOUTH TWICE DAILY, Disp: 60 tablet, Rfl: 0   temazepam  (RESTORIL ) 30 MG capsule, Take 1 capsule (30 mg total) by mouth at bedtime as needed for sleep., Disp: 30 capsule, Rfl: 0  Review of Symptoms: Pertinent positives as per HPI.  Physical Exam: Deferred given limitations of phone visit.  Laboratory & Radiologic Studies: None new   Assessment & Plan: Leah Holland is a 89 y.o. 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   Surgery 10/2021.   Continues to do well without symptoms concerning for recurrence. She would like to continue with q 6 months phone visits. She  will call if any needs before our next visit.  I discussed the assessment and treatment plan with the patient. The patient was provided with an opportunity to ask questions and all were answered. The patient agreed with the plan and demonstrated an understanding of the instructions.   The patient was advised to call back or see an in-person evaluation if the symptoms worsen or if the condition fails  to improve as anticipated.   6 minutes of total time was spent for this patient encounter, including preparation, phone counseling with the patient and coordination of care, and documentation of the encounter.   Wiley Hanger, MD  Division of Gynecologic Oncology  Department of Obstetrics and Gynecology  [facility]

## 2024-06-03 ENCOUNTER — Other Ambulatory Visit: Payer: Self-pay | Admitting: Infectious Disease

## 2024-06-23 NOTE — Progress Notes (Unsigned)
 Subjective:   Chief complaint: follow-up for PJI    [name], female    DOB: [birth year], 89 y.o.   MRN: [id number]  HPI  [name] is a 89 year old lady with admission in April of 2017 with right intertrochanteric proximal femur nonunion who progressed to ultimately break her intramedullary nail and elected for surgical management. She had removal of hardware including intramedullary nail, and treatment of intertrochanteric fracture with prosthesis placement. Dr. Josefina got several intra-operative cultures due to concerrn for infection and two separate cultures grew serratia. Patient was placed on oral levaquin after discussion with me and had remained on this some time..   Interim history:  She had done relatively well but recently began to complain of pain behind her prosthetic knees which she believed might be due to toxicity of fluoroquinolone to tendons after she and her son--who is a Pediatrician read about side effects of FQ.   I was  actually skeptical about FQ toxicity here but I DID think she would be better served over the long run if she is to be on chronic suppressive antibiotics NOT being on a fluoroquinolone given risk for Clostridium difficile colitis in particular. We changed her  over to single strength Bactrim  and her tendon pain went away immediately. She has had normal creatinine and potassium while on single strength Bactrim .  She then  fractured her opposite hip and underwent IM nail by Dr. Jerri   She ended up needing a THA on that side as well.   Bilateral knee replacements as well.  She then had a  periprosthetic fracture of left femur which was treated non operatively with having her be not weight bearing.  She then developed quadriceps tendinitis when she did bear weight because she was bearing it more so on the right.  She is being followed closely by Dr. Jerri.   She was found to have a poorly differentiated carcinoma when she  recently had TAH.  She has elected against systemic chemotherapy.  She has remained on her bactrim  and did not want to come off of this antibiotic and risk recurrence.  As I last saw her she has been followed up with [facility] gynecology oncology.  She is scheduled to see them again shortly.  Unfortunately she suffered the loss of a grandson who committed suicide.  This brought back to her memory of her son who is killed in [place] war.  She suffered from depressive symptoms and grieving.  She did lose weight she could certainly have been due to her anhedonia in the context of her depressive symptoms and grieving.  The counselor at the facility she lives and was not available.  She does not have much in the way of pain currently but still wants to stay on her antibiotics  Discussed the use of AI scribe software for clinical note transcription with the patient, who gave verbal consent to proceed.  History of Present Illness   Leah Holland is a 89 year old female with a history of hip replacement infection who presents for follow-up on her hip condition.  She has undergone bilateral hip replacements, with the first surgery resulting in an infection. She is currently on Bactrim  for suppressive therapy and experiences no symptoms or discomfort in her hip. She uses a motorized wheelchair for mobility, particularly for traveling within her living facility. She describes her legs as problematic, which may affect her mobility. Her cancer  has not returned she says         Past Medical History:  Diagnosis Date   Anxiety attack    take RX prn (08/13/2013)   Arthritis    probably (08/13/2013)   Atrial fibrillation (HCC)    new onset/pt report 08/13/2013   Bladder wall thickening 07/01/2021   CT scan in Epic   Bronchitis 12/2011   Cholelithiasis 07/01/2021   CT in Epic.   Common bile duct dilation 07/01/2021   CT in Epic:  13 mm.   Complication of anesthesia    Compression  fracture of first lumbar vertebra (HCC) 07/01/2021   CT in Epic.   Depression    Femur fracture (HCC)    left proximal femur non-union   GERD (gastroesophageal reflux disease)    Grieving 05/03/2023   H/O hiatal hernia    Hardware complicating wound infection (HCC) 08/22/2016   History of blood transfusion    w/both knee OR's (08/13/2013)   Hyperlipidemia    Hyperlipidemia    Hypertension    Hypertensive urgency 10/22/2018   Hypothyroidism    takes synthroid    Insomnia    Intertrochanteric fracture of right hip (HCC) 07/30/2015   Neuromuscular disorder (HCC)    carpal tunnel in right hand   Periprosthetic fracture around internal prosthetic right hip joint (HCC), nonunion basicervical femoral neck with broken hardware and subtrochanteric fracture 03/16/2016   PONV (postoperative nausea and vomiting)    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 Court, cardiologist 1 x year [phone]   Tendon dysfunction 08/22/2016   Wears hearing aid    [facility]    Past Surgical History:  Procedure Laterality Date   2D ECHOCARDIOGRAM  12/07/2004   EF 48%, moderate pulmonary hypertension   BREAST BIOPSY Left ?1970's   CARDIOVASCULAR STRESS TEST  12/07/11   CARDIOVASCULAR STRESS TEST  12/07/2011   LEXISCAN , normal scan, no significant wall abnormalities noted   CARPAL TUNNEL RELEASE Right ~ 2005   CATARACT EXTRACTION, BILATERAL Bilateral 2000   CYSTOSCOPY  11/02/2021   Procedure: CYSTOSCOPY;  Surgeon: Viktoria Comer SAUNDERS, MD;  Location: [facility];  Service: Gynecology;;   DILATION AND CURETTAGE OF UTERUS  1950's   DOPPLER ECHOCARDIOGRAPHY  12/07/11   EYE SURGERY     FEMUR IM NAIL Right 08/01/2015   Procedure: INTRAMEDULLARY (IM) NAIL FEMORAL;   Surgeon: Fonda Olmsted, MD;  Location: [facility];  Service: Orthopedics;  Laterality: Right;   HARDWARE REMOVAL Right 03/16/2016   Procedure: HARDWARE REMOVAL;  Surgeon: Fonda Olmsted, MD;  Location: [facility] OR;  Service: Orthopedics;  Laterality: Right;   INTRAMEDULLARY (IM) NAIL INTERTROCHANTERIC Left 01/26/2017   Procedure: INTRAMEDULLARY (IM) NAIL INTERTROCHANTRIC;  Surgeon: Kay CHRISTELLA Cummins, MD;  Location: [facility];  Service: Orthopedics;  Laterality: Left;   JOINT REPLACEMENT     bilateral knee replacement   ROBOTIC ASSISTED BILATERAL SALPINGO OOPHERECTOMY Bilateral 11/02/2021   Procedure: XI ROBOTIC ASSISTED BILATERAL SALPINGO OOPHORECTOMY;  Surgeon: Viktoria Comer SAUNDERS, MD;  Location: [facility];  Service: Gynecology;  Laterality: Bilateral;   ROBOTIC ASSISTED TOTAL HYSTERECTOMY N/A 11/02/2021   Procedure: XI ROBOTIC ASSISTED TOTAL HYSTERECTOMY WITH  MINI LAPAROTOMY;  Surgeon: Viktoria Comer SAUNDERS, MD;  Location: [facility];  Service: Gynecology;  Laterality: N/A;   SKIN CANCER EXCISION  2000's   off my nose (08/13/2013)   TOTAL HIP ARTHROPLASTY Right 03/16/2016   Procedure: TOTAL HIP ARTHROPLASTY;  Surgeon: Fonda Olmsted, MD;  [facility]: [facility];  Service: Orthopedics;  Laterality: Right;   TOTAL HIP ARTHROPLASTY Left 06/09/2017   Procedure: REMOVE LEFT HIP INTRAMEDULLARY NAIL AND CONVERT TO LEFT CEMENTED HEMI HIP ARTHROPLASTY;  Surgeon: Jerri Kay HERO, MD;  Location: [facility];  Service: Orthopedics;  Laterality: Left;   TOTAL KNEE ARTHROPLASTY Right 2005   TOTAL KNEE ARTHROPLASTY  03/02/2012   Procedure: TOTAL KNEE ARTHROPLASTY;  Surgeon: LELON JONETTA Shari Mickey., MD;  Location: [facility];  Service: Orthopedics;  Laterality: Left;    Family History  Problem Relation Age of Onset   Arrhythmia Mother    Heart disease Mother    Stroke Father 32   Arrhythmia Sister    Heart disease Sister    Heart disease Sister    Stroke Sister    Diabetes Sister    Heart disease Brother    Heart disease Brother    Cancer Brother        Colon  cancer   Arrhythmia Brother    Heart disease Brother    Heart disease Child 46   Heart disease Child 7   Breast cancer Neg Hx    Ovarian cancer Neg Hx    Endometrial cancer Neg Hx    Pancreatic cancer Neg Hx    Prostate cancer Neg Hx       Social History   Socioeconomic History   Marital status: Widowed    Spouse name: Not on file   Number of children: Not on file   Years of education: Not on file   Highest education level: Not on file  Occupational History   Not on file  Tobacco Use   Smoking status: Never   Smokeless tobacco: Never  Vaping Use   Vaping status: Never Used  Substance and Sexual Activity   Alcohol use: No   Drug use: No   Sexual activity: Not Currently  Other Topics Concern   Not on file  Social History Narrative   Widowed. Lives at [facility]. Son died in the [place] War and she has not slept well since then.   Admitted to [facility] 01/30/17   Never smoked   Alcohol none   Full Code   Social Drivers of Health   Financial Resource Strain: Not on file  Food Insecurity: Not on file  Transportation Needs: Not on file  Physical Activity: Not on file  Stress: Not on file  Social Connections: Not on file    Allergies  Allergen Reactions   Levaquin [Levofloxacin] Other (See Comments)    tendinopathy   Morphine  And Codeine Other (See Comments)    Made pt wild   Morphine  Sulfate     Other reaction(s): made pt wild     Current Outpatient Medications:    acetaminophen  (TYLENOL ) 325 MG tablet, Take 650 mg by mouth every 6 (six) hours as needed for moderate pain. , Disp: , Rfl:    Apoaequorin (PREVAGEN EXTRA STRENGTH) 20 MG CAPS, Take 1 tablet by mouth daily., Disp: , Rfl:    chlorthalidone  (HYGROTON ) 25 MG tablet, TAKE 1 TABLET ONCE DAILY., Disp: 90 tablet, Rfl: 2   Cholecalciferol  2000 units CAPS, Take 2,000 Units by mouth every evening., Disp: , Rfl:    diltiazem  (CARTIA  XT) 180 MG 24 hr capsule, TAKE  ONE CAPSULE BY MOUTH  DAILY, Disp: 90 capsule, Rfl: 3   ELIQUIS  2.5 MG TABS tablet, TAKE ONE TABLET BY MOUTH TWICE DAILY., Disp: 180 tablet, Rfl: 1   Flaxseed, Linseed, (FLAXSEED OIL) 1000 MG CAPS, Take 1,000 mg by mouth 2 (two) times daily. , Disp: , Rfl:    levothyroxine  (SYNTHROID ) 112 MCG tablet, Take 112 mcg by mouth daily before breakfast. , Disp: , Rfl:    magnesium  hydroxide (MILK OF MAGNESIA) 400 MG/5ML suspension, Take 30 mLs by mouth daily as needed for mild constipation., Disp: , Rfl:    metoprolol  tartrate (LOPRESSOR ) 50 MG tablet, take 1 tablet with food Orally Twice a day 90 days, Disp: 180 tablet, Rfl: 3   Multiple Vitamins-Minerals (PRESERVISION AREDS 2+MULTI VIT PO), Take 1 capsule by mouth 2 (two) times daily., Disp: , Rfl:    senna (SENOKOT) 8.6 MG tablet, Take 2 tablets by mouth at bedtime., Disp: , Rfl:    sulfamethoxazole -trimethoprim  (BACTRIM ) 400-80 MG tablet, TAKE ONE TABLET BY MOUTH TWICE DAILY, Disp: 60 tablet, Rfl: 0   temazepam  (RESTORIL ) 30 MG capsule, Take 1 capsule (30 mg total) by mouth at bedtime as needed for sleep., Disp: 30 capsule, Rfl: 0   Review of Systems  Constitutional:  Negative for activity change, appetite change, chills, diaphoresis, fatigue, fever and unexpected weight change.  HENT:  Negative for congestion, rhinorrhea, sinus pressure, sneezing, sore throat and trouble swallowing.   Eyes:  Negative for photophobia and visual disturbance.  Respiratory:  Negative for cough, chest tightness, shortness of breath, wheezing and stridor.   Cardiovascular:  Negative for chest pain, palpitations and leg swelling.  Gastrointestinal:  Negative for abdominal distention, abdominal pain, anal bleeding, blood in stool, constipation, diarrhea, nausea and vomiting.  Genitourinary:  Negative for difficulty urinating, dysuria, flank pain and hematuria.  Musculoskeletal:  Negative for arthralgias, back pain, gait problem, joint swelling and myalgias.  Skin:  Negative for color change,  pallor, rash and wound.  Neurological:  Negative for dizziness, tremors, weakness and light-headedness.  Hematological:  Negative for adenopathy. Does not bruise/bleed easily.  Psychiatric/Behavioral:  Negative for agitation, behavioral problems, confusion, decreased concentration, dysphoric mood and sleep disturbance.        Objective:   Physical Exam Constitutional:      Appearance: Normal appearance.  Eyes:     General:        Right eye: No discharge.        Left eye: No discharge.     Conjunctiva/sclera: Conjunctivae normal.  Cardiovascular:     Rate and Rhythm: Normal rate and regular rhythm.  Pulmonary:     Effort: No respiratory distress.     Breath sounds: No wheezing.  Abdominal:     General: Bowel sounds are normal.  Musculoskeletal:        General: Normal range of motion.  Skin:    General: Skin is warm and dry.  Neurological:     General: No focal deficit present.     Mental Status: She is alert.  Psychiatric:        Mood and Affect: Mood normal.        Behavior: Behavior normal.        Thought Content: Thought content normal.        Judgment: Judgment normal.           Assessment & Plan:   Assessment and Plan    Chronic infected hip prosthesis Asymptomatic. Continues on oral Bactrim  for infection suppression. Concerned about discontinuing  antibiotics due to acquaintances' experiences including one who actually died due to her PJI - Continue oral Bactrim  for infection suppression. - Order blood work to monitor kidney function and inflammatory markers.  Malignancy in remission Malignancy treated surgically four years ago. - Continue regular follow-up phone check-ins with surgeon, next in December.

## 2024-06-24 ENCOUNTER — Other Ambulatory Visit: Payer: Self-pay

## 2024-06-24 ENCOUNTER — Encounter: Payer: Self-pay | Admitting: Infectious Disease

## 2024-06-24 ENCOUNTER — Ambulatory Visit: Admitting: Infectious Disease

## 2024-06-24 VITALS — BP 153/78 | HR 63 | Temp 97.4°F | Wt 127.0 lb

## 2024-06-24 DIAGNOSIS — B9689 Other specified bacterial agents as the cause of diseases classified elsewhere: Secondary | ICD-10-CM | POA: Diagnosis not present

## 2024-06-24 DIAGNOSIS — T8451XD Infection and inflammatory reaction due to internal right hip prosthesis, subsequent encounter: Secondary | ICD-10-CM | POA: Diagnosis present

## 2024-06-24 DIAGNOSIS — T8450XD Infection and inflammatory reaction due to unspecified internal joint prosthesis, subsequent encounter: Secondary | ICD-10-CM

## 2024-06-24 DIAGNOSIS — C541 Malignant neoplasm of endometrium: Secondary | ICD-10-CM

## 2024-06-24 DIAGNOSIS — A498 Other bacterial infections of unspecified site: Secondary | ICD-10-CM

## 2024-06-24 MED ORDER — SULFAMETHOXAZOLE-TRIMETHOPRIM 400-80 MG PO TABS: 1.0000 | ORAL_TABLET | Freq: Two times a day (BID) | ORAL | 11 refills | Status: AC

## 2024-06-24 NOTE — Addendum Note (Signed)
 Addended by: FLEETA KATHIE FLEET N on: 06/24/2024 10:03 AM   Modules accepted: Orders

## 2024-06-24 NOTE — Patient Instructions (Signed)
 Follow up in one year/ labs today.

## 2024-06-25 LAB — CBC WITH DIFFERENTIAL/PLATELET
Absolute Lymphocytes: 1980 {cells}/uL (ref 850–3900)
Absolute Monocytes: 619 {cells}/uL (ref 200–950)
Basophils Absolute: 50 {cells}/uL (ref 0–200)
Basophils Relative: 0.7 %
Eosinophils Absolute: 252 {cells}/uL (ref 15–500)
Eosinophils Relative: 3.5 %
HCT: 47.7 % — ABNORMAL HIGH (ref 35.0–45.0)
Hemoglobin: 15.4 g/dL (ref 11.7–15.5)
MCH: 31.6 pg (ref 27.0–33.0)
MCHC: 32.3 g/dL (ref 32.0–36.0)
MCV: 97.7 fL (ref 80.0–100.0)
MPV: 10.5 fL (ref 7.5–12.5)
Monocytes Relative: 8.6 %
Neutro Abs: 4298 {cells}/uL (ref 1500–7800)
Neutrophils Relative %: 59.7 %
Platelets: 227 Thousand/uL (ref 140–400)
RBC: 4.88 Million/uL (ref 3.80–5.10)
RDW: 11.8 % (ref 11.0–15.0)
Total Lymphocyte: 27.5 %
WBC: 7.2 Thousand/uL (ref 3.8–10.8)

## 2024-06-25 LAB — BASIC METABOLIC PANEL WITHOUT GFR
BUN/Creatinine Ratio: 20 (calc) (ref 6–22)
BUN: 26 mg/dL — ABNORMAL HIGH (ref 7–25)
CO2: 31 mmol/L (ref 20–32)
Calcium: 10.9 mg/dL — ABNORMAL HIGH (ref 8.6–10.4)
Chloride: 101 mmol/L (ref 98–110)
Creat: 1.27 mg/dL — ABNORMAL HIGH (ref 0.60–0.95)
Glucose, Bld: 83 mg/dL (ref 65–99)
Potassium: 4.1 mmol/L (ref 3.5–5.3)
Sodium: 139 mmol/L (ref 135–146)

## 2024-06-25 LAB — C-REACTIVE PROTEIN: CRP: <3 mg/L (ref <8.0)

## 2024-06-25 LAB — SEDIMENTATION RATE: Sed Rate: 2 mm/h (ref 0–30)

## 2024-07-09 ENCOUNTER — Other Ambulatory Visit: Payer: Self-pay | Admitting: Cardiovascular Disease

## 2024-07-09 DIAGNOSIS — I10 Essential (primary) hypertension: Secondary | ICD-10-CM

## 2024-07-21 ENCOUNTER — Emergency Department (HOSPITAL_COMMUNITY)

## 2024-07-21 ENCOUNTER — Other Ambulatory Visit: Payer: Self-pay

## 2024-07-21 ENCOUNTER — Encounter (HOSPITAL_COMMUNITY): Payer: Self-pay

## 2024-07-21 ENCOUNTER — Inpatient Hospital Stay (HOSPITAL_COMMUNITY)
Admission: EM | Admit: 2024-07-21 | Discharge: 2024-07-25 | DRG: 481 | Disposition: A | Discharge status: Skilled Nursing Facility | Source: Skilled Nursing Facility | Attending: Family Medicine | Admitting: Family Medicine

## 2024-07-21 DIAGNOSIS — Z993 Dependence on wheelchair: Secondary | ICD-10-CM | POA: Diagnosis not present

## 2024-07-21 DIAGNOSIS — Z7901 Long term (current) use of anticoagulants: Secondary | ICD-10-CM

## 2024-07-21 DIAGNOSIS — Z9841 Cataract extraction status, right eye: Secondary | ICD-10-CM | POA: Diagnosis not present

## 2024-07-21 DIAGNOSIS — Z79890 Hormone replacement therapy (postmenopausal): Secondary | ICD-10-CM

## 2024-07-21 DIAGNOSIS — E876 Hypokalemia: Secondary | ICD-10-CM | POA: Diagnosis not present

## 2024-07-21 DIAGNOSIS — E039 Hypothyroidism, unspecified: Secondary | ICD-10-CM | POA: Diagnosis present

## 2024-07-21 DIAGNOSIS — W1830XA Fall on same level, unspecified, initial encounter: Secondary | ICD-10-CM | POA: Diagnosis present

## 2024-07-21 DIAGNOSIS — Z8249 Family history of ischemic heart disease and other diseases of the circulatory system: Secondary | ICD-10-CM | POA: Diagnosis not present

## 2024-07-21 DIAGNOSIS — D62 Acute posthemorrhagic anemia: Secondary | ICD-10-CM | POA: Diagnosis not present

## 2024-07-21 DIAGNOSIS — I129 Hypertensive chronic kidney disease with stage 1 through stage 4 chronic kidney disease, or unspecified chronic kidney disease: Secondary | ICD-10-CM | POA: Diagnosis present

## 2024-07-21 DIAGNOSIS — Z85828 Personal history of other malignant neoplasm of skin: Secondary | ICD-10-CM | POA: Diagnosis not present

## 2024-07-21 DIAGNOSIS — N1832 Chronic kidney disease, stage 3b: Secondary | ICD-10-CM | POA: Diagnosis present

## 2024-07-21 DIAGNOSIS — F418 Other specified anxiety disorders: Secondary | ICD-10-CM | POA: Diagnosis not present

## 2024-07-21 DIAGNOSIS — Z96652 Presence of left artificial knee joint: Secondary | ICD-10-CM | POA: Diagnosis present

## 2024-07-21 DIAGNOSIS — Z96643 Presence of artificial hip joint, bilateral: Secondary | ICD-10-CM | POA: Diagnosis present

## 2024-07-21 DIAGNOSIS — R569 Unspecified convulsions: Secondary | ICD-10-CM | POA: Diagnosis not present

## 2024-07-21 DIAGNOSIS — Z90710 Acquired absence of both cervix and uterus: Secondary | ICD-10-CM | POA: Diagnosis not present

## 2024-07-21 DIAGNOSIS — Z79899 Other long term (current) drug therapy: Secondary | ICD-10-CM | POA: Diagnosis not present

## 2024-07-21 DIAGNOSIS — E785 Hyperlipidemia, unspecified: Secondary | ICD-10-CM | POA: Diagnosis present

## 2024-07-21 DIAGNOSIS — Z9842 Cataract extraction status, left eye: Secondary | ICD-10-CM | POA: Diagnosis not present

## 2024-07-21 DIAGNOSIS — K5900 Constipation, unspecified: Secondary | ICD-10-CM | POA: Diagnosis present

## 2024-07-21 DIAGNOSIS — Z881 Allergy status to other antibiotic agents status: Secondary | ICD-10-CM

## 2024-07-21 DIAGNOSIS — M9711XA Periprosthetic fracture around internal prosthetic right knee joint, initial encounter: Secondary | ICD-10-CM | POA: Diagnosis present

## 2024-07-21 DIAGNOSIS — Z885 Allergy status to narcotic agent status: Secondary | ICD-10-CM

## 2024-07-21 DIAGNOSIS — S72401A Unspecified fracture of lower end of right femur, initial encounter for closed fracture: Principal | ICD-10-CM | POA: Diagnosis present

## 2024-07-21 DIAGNOSIS — Z974 Presence of external hearing-aid: Secondary | ICD-10-CM

## 2024-07-21 DIAGNOSIS — Z833 Family history of diabetes mellitus: Secondary | ICD-10-CM | POA: Diagnosis not present

## 2024-07-21 DIAGNOSIS — R55 Syncope and collapse: Secondary | ICD-10-CM | POA: Diagnosis not present

## 2024-07-21 DIAGNOSIS — I4891 Unspecified atrial fibrillation: Secondary | ICD-10-CM | POA: Diagnosis present

## 2024-07-21 DIAGNOSIS — S7291XA Unspecified fracture of right femur, initial encounter for closed fracture: Principal | ICD-10-CM | POA: Diagnosis present

## 2024-07-21 DIAGNOSIS — I10 Essential (primary) hypertension: Secondary | ICD-10-CM | POA: Diagnosis not present

## 2024-07-21 LAB — CBC WITH DIFFERENTIAL/PLATELET
Abs Immature Granulocytes: 0.06 K/uL (ref 0.00–0.07)
Basophils Absolute: 0.1 K/uL (ref 0.0–0.1)
Basophils Relative: 1 %
Eosinophils Absolute: 0.2 K/uL (ref 0.0–0.5)
Eosinophils Relative: 2 %
HCT: 48.6 % — ABNORMAL HIGH (ref 36.0–46.0)
Hemoglobin: 16 g/dL — ABNORMAL HIGH (ref 12.0–15.0)
Immature Granulocytes: 1 %
Lymphocytes Relative: 19 %
Lymphs Abs: 1.7 K/uL (ref 0.7–4.0)
MCH: 31.6 pg (ref 26.0–34.0)
MCHC: 32.9 g/dL (ref 30.0–36.0)
MCV: 95.9 fL (ref 80.0–100.0)
Monocytes Absolute: 0.6 K/uL (ref 0.1–1.0)
Monocytes Relative: 6 %
Neutro Abs: 6.5 K/uL (ref 1.7–7.7)
Neutrophils Relative %: 71 %
Platelets: 265 K/uL (ref 150–400)
RBC: 5.07 MIL/uL (ref 3.87–5.11)
RDW: 13.6 % (ref 11.5–15.5)
WBC: 9.1 K/uL (ref 4.0–10.5)
nRBC: 0 % (ref 0.0–0.2)

## 2024-07-21 LAB — COMPREHENSIVE METABOLIC PANEL WITH GFR
ALT: 20 U/L (ref 0–44)
AST: 22 U/L (ref 15–41)
Albumin: 3.9 g/dL (ref 3.5–5.0)
Alkaline Phosphatase: 105 U/L (ref 38–126)
Anion gap: 12 (ref 5–15)
BUN: 20 mg/dL (ref 8–23)
CO2: 27 mmol/L (ref 22–32)
Calcium: 10.9 mg/dL — ABNORMAL HIGH (ref 8.9–10.3)
Chloride: 99 mmol/L (ref 98–111)
Creatinine, Ser: 1.2 mg/dL — ABNORMAL HIGH (ref 0.44–1.00)
GFR, Estimated: 41 mL/min — ABNORMAL LOW (ref >60)
Glucose, Bld: 150 mg/dL — ABNORMAL HIGH (ref 70–99)
Potassium: 3.5 mmol/L (ref 3.5–5.1)
Sodium: 138 mmol/L (ref 135–145)
Total Bilirubin: 0.9 mg/dL (ref 0.0–1.2)
Total Protein: 7.2 g/dL (ref 6.5–8.1)

## 2024-07-21 LAB — TROPONIN I (HIGH SENSITIVITY)
Troponin I (High Sensitivity): 8 ng/L (ref <18)
Troponin I (High Sensitivity): 11 ng/L (ref <18)

## 2024-07-21 MED ORDER — CHLORTHALIDONE 25 MG PO TABS
25.0000 mg | ORAL_TABLET | Freq: Every day | ORAL | Status: DC
  Filled 2024-07-21: qty 1

## 2024-07-21 MED ORDER — OXYCODONE HCL 5 MG PO TABS: 5.0000 mg | ORAL_TABLET | ORAL | Status: DC | PRN

## 2024-07-21 MED ORDER — LACTATED RINGERS IV BOLUS
500.0000 mL | Freq: Once | INTRAVENOUS | Status: AC
  Administered 2024-07-21: 500 mL via INTRAVENOUS

## 2024-07-21 MED ORDER — FENTANYL CITRATE PF 50 MCG/ML IJ SOSY
50.0000 ug | PREFILLED_SYRINGE | Freq: Once | INTRAMUSCULAR | Status: AC
  Administered 2024-07-21: 50 ug via INTRAVENOUS
  Filled 2024-07-21: qty 1

## 2024-07-21 MED ORDER — HYDROMORPHONE HCL 1 MG/ML IJ SOLN
0.5000 mg | Freq: Once | INTRAMUSCULAR | Status: AC
  Administered 2024-07-21: 0.5 mg via INTRAVENOUS
  Filled 2024-07-21: qty 1

## 2024-07-21 MED ORDER — DILTIAZEM HCL ER COATED BEADS 180 MG PO CP24
180.0000 mg | ORAL_CAPSULE | Freq: Every day | ORAL | Status: DC
  Administered 2024-07-21 – 2024-07-25 (×8): 180 mg via ORAL
  Filled 2024-07-21 (×5): qty 1

## 2024-07-21 MED ORDER — ACETAMINOPHEN 500 MG PO TABS
1000.0000 mg | ORAL_TABLET | Freq: Three times a day (TID) | ORAL | Status: DC | PRN
  Administered 2024-07-21 – 2024-07-22 (×4): 1000 mg via ORAL
  Filled 2024-07-21 (×3): qty 2

## 2024-07-21 MED ORDER — LEVOTHYROXINE SODIUM 112 MCG PO TABS
112.0000 ug | ORAL_TABLET | Freq: Every day | ORAL | Status: DC
  Administered 2024-07-23 – 2024-07-25 (×5): 112 ug via ORAL
  Filled 2024-07-21 (×3): qty 1

## 2024-07-21 MED ORDER — HYDROMORPHONE HCL 1 MG/ML IJ SOLN
0.5000 mg | Freq: Once | INTRAMUSCULAR | Status: DC | PRN
  Filled 2024-07-21: qty 1

## 2024-07-21 MED ORDER — GABAPENTIN 300 MG PO CAPS
300.0000 mg | ORAL_CAPSULE | Freq: Three times a day (TID) | ORAL | Status: DC
  Administered 2024-07-21 – 2024-07-23 (×10): 300 mg via ORAL
  Filled 2024-07-21 (×6): qty 1

## 2024-07-21 MED ORDER — TEMAZEPAM 7.5 MG PO CAPS
15.0000 mg | ORAL_CAPSULE | Freq: Once | ORAL | Status: AC
  Administered 2024-07-21: 15 mg via ORAL
  Filled 2024-07-21: qty 2

## 2024-07-21 MED ORDER — SODIUM CHLORIDE 0.9 % IV SOLN: INTRAVENOUS | Status: AC

## 2024-07-21 MED ORDER — DILTIAZEM HCL ER COATED BEADS 180 MG PO CP24: 180.0000 mg | ORAL_CAPSULE | Freq: Every day | ORAL | Status: DC

## 2024-07-21 NOTE — Progress Notes (Signed)
 Orthopedic Tech Progress Note Patient Details:  Leah Holland 11/21/1922 [id number]  Musculoskeletal Traction Type of Traction: Bucks Skin Traction Traction Location: Right leg Traction Weight: 5 lbs   Post Interventions Patient Tolerated: Well  Leah Holland 07/21/2024, 12:13 PM

## 2024-07-21 NOTE — Progress Notes (Signed)
 EEG complete - results pending

## 2024-07-21 NOTE — Consult Note (Signed)
 ORTHOPAEDIC CONSULTATION  REQUESTING PHYSICIAN: Georgina Basket, MD  Chief Complaint: right knee pain  HPI: Leah Holland is a 89 y.o. female who complains of pain in the right thigh and knee after falling at her ALF inside her room trying to get to the bathroom. Does not remember all the specifics of the fall. Sounds similar to a fall she had 1 week ago that caused R hip pain. She thinks she got up from the bed too fast. She has a complex medical history of multiple orthopedic surgeries and fractures. She has B/L knee and hip replacements by Dr. Shari, Dr. Josefina, and Dr. Jerri. She has recently seen Dr. Jerri in office about ongoing pain. She has has a history of PJI and is on long term ABX for this via ID team.   Imaging shows an oblique fracture of the mid to distal right femoral shaft with nearly 1 shaft width of medial displacement of the distal fracture fragment, extensive overlap, and apex anterior angulation on the lateral view. No intraarticular extension.    Orthopedics was consulted for evaluation.   Last meal was dinner last night around 7pm. Last Eliquis  dose after dinner around 8pm. No history of MI, CVA, DVT, PE.  Previously ambulatory with a walker or rollator but wasn't using one when she fell.  The patient is living at [facility].    Past Medical History:  Diagnosis Date   Anxiety attack    take RX prn (08/13/2013)   Arthritis    probably (08/13/2013)   Atrial fibrillation (HCC)    new onset/pt report 08/13/2013   Bladder wall thickening 07/01/2021   CT scan in Epic   Bronchitis 12/2011   Cholelithiasis 07/01/2021   CT in Epic.   Common bile duct dilation 07/01/2021   CT in Epic:  13 mm.   Complication of anesthesia    Compression fracture of first lumbar vertebra (HCC) 07/01/2021   CT in Epic.   Depression    Femur fracture (HCC)    left proximal femur non-union   GERD (gastroesophageal reflux disease)    Grieving 05/03/2023   H/O hiatal hernia     Hardware complicating wound infection (HCC) 08/22/2016   History of blood transfusion    w/both knee OR's (08/13/2013)   Hyperlipidemia    Hyperlipidemia    Hypertension    Hypertensive urgency 10/22/2018   Hypothyroidism    takes synthroid    Insomnia    Intertrochanteric fracture of right hip (HCC) 07/30/2015   Neuromuscular disorder (HCC)    carpal tunnel in right hand   Periprosthetic fracture around internal prosthetic right hip joint (HCC), nonunion basicervical femoral neck with broken hardware and subtrochanteric fracture 03/16/2016   PONV (postoperative nausea and vomiting)    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 Court, cardiologist 1 x year [phone]   Tendon dysfunction 08/22/2016   Wears hearing aid    [facility]   Past Surgical History:  Procedure Laterality Date   2D ECHOCARDIOGRAM  12/07/2004   EF 48%, moderate pulmonary hypertension   BREAST BIOPSY Left ?1970's   CARDIOVASCULAR STRESS TEST  12/07/11   CARDIOVASCULAR STRESS TEST  12/07/2011   LEXISCAN ,  normal scan, no significant wall abnormalities noted   CARPAL TUNNEL RELEASE Right ~ 2005   CATARACT EXTRACTION, BILATERAL Bilateral 2000   CYSTOSCOPY  11/02/2021   Procedure: CYSTOSCOPY;  Surgeon: Viktoria Comer SAUNDERS, MD;  Location: [facility];  Service: Gynecology;;   DILATION AND CURETTAGE OF UTERUS  1950's   DOPPLER ECHOCARDIOGRAPHY  12/07/11   EYE SURGERY     FEMUR IM NAIL Right 08/01/2015   Procedure: INTRAMEDULLARY (IM) NAIL FEMORAL;  Surgeon: Fonda Olmsted, MD;  Location: [facility] OR;  Service: Orthopedics;  Laterality: Right;   HARDWARE REMOVAL Right 03/16/2016   Procedure: HARDWARE REMOVAL;  Surgeon: Fonda Olmsted, MD;  Location: [facility] OR;  Service: Orthopedics;   Laterality: Right;   INTRAMEDULLARY (IM) NAIL INTERTROCHANTERIC Left 01/26/2017   Procedure: INTRAMEDULLARY (IM) NAIL INTERTROCHANTRIC;  Surgeon: Kay CHRISTELLA Cummins, MD;  Location: [facility] OR;  Service: Orthopedics;  Laterality: Left;   JOINT REPLACEMENT     bilateral knee replacement   ROBOTIC ASSISTED BILATERAL SALPINGO OOPHERECTOMY Bilateral 11/02/2021   Procedure: XI ROBOTIC ASSISTED BILATERAL SALPINGO OOPHORECTOMY;  Surgeon: Viktoria Comer SAUNDERS, MD;  Location: [facility];  Service: Gynecology;  Laterality: Bilateral;   ROBOTIC ASSISTED TOTAL HYSTERECTOMY N/A 11/02/2021   Procedure: XI ROBOTIC ASSISTED TOTAL HYSTERECTOMY WITH  MINI LAPAROTOMY;  Surgeon: Viktoria Comer SAUNDERS, MD;  Location: [facility];  Service: Gynecology;  Laterality: N/A;   SKIN CANCER EXCISION  2000's   off my nose (08/13/2013)   TOTAL HIP ARTHROPLASTY Right 03/16/2016   Procedure: TOTAL HIP ARTHROPLASTY;  Surgeon: Fonda Olmsted, MD;  Location: [facility] OR;  Service: Orthopedics;  Laterality: Right;   TOTAL HIP ARTHROPLASTY Left 06/09/2017   Procedure: REMOVE LEFT HIP INTRAMEDULLARY NAIL AND CONVERT TO LEFT CEMENTED HEMI HIP ARTHROPLASTY;  Surgeon: Cummins Kay CHRISTELLA, MD;  Location: [facility] OR;  Service: Orthopedics;  Laterality: Left;   TOTAL KNEE ARTHROPLASTY Right 2005   TOTAL KNEE ARTHROPLASTY  03/02/2012   Procedure: TOTAL KNEE ARTHROPLASTY;  Surgeon: LELON JONETTA Shari Mickey., MD;  Location: [facility] OR;  Service: Orthopedics;  Laterality: Left;   Social History   Socioeconomic History   Marital status: Widowed    Spouse name: Not on file   Number of children: Not on file   Years of education: Not on file   Highest education level: Not on file  Occupational History   Not on file  Tobacco Use   Smoking status: Never   Smokeless tobacco: Never  Vaping Use   Vaping status: Never Used  Substance and Sexual Activity   Alcohol use: No   Drug use: No   Sexual activity: Not Currently  Other Topics Concern   Not on file  Social History Narrative   Widowed.  Lives at [facility]. Son died in the [place] War and she has not slept well since then.   Admitted to [facility] 01/30/17   Never smoked   Alcohol none   Full Code   Social Drivers of Health   Financial Resource Strain: Not on file  Food Insecurity: Not on file  Transportation Needs: Not on file  Physical Activity: Not on file  Stress: Not on file  Social Connections: Not on file   Family History  Problem Relation Age of Onset   Arrhythmia Mother    Heart disease Mother    Stroke Father 49   Arrhythmia Sister    Heart disease Sister    Heart disease Sister    Stroke Sister    Diabetes Sister  Heart disease Brother    Heart disease Brother    Cancer Brother        Colon cancer   Arrhythmia Brother    Heart disease Brother    Heart disease Child 46   Heart disease Child 26   Breast cancer Neg Hx    Ovarian cancer Neg Hx    Endometrial cancer Neg Hx    Pancreatic cancer Neg Hx    Prostate cancer Neg Hx    Allergies  Allergen Reactions   Levaquin [Levofloxacin] Other (See Comments)    tendinopathy   Morphine  And Codeine Other (See Comments)    Made pt wild   Morphine  Sulfate     Other reaction(s): made pt wild   Prior to Admission medications   Medication Sig Start Date End Date Taking? Authorizing Provider  acetaminophen  (TYLENOL ) 325 MG tablet Take 650 mg by mouth every 6 (six) hours as needed for moderate pain.     [provider]  Apoaequorin (PREVAGEN EXTRA STRENGTH) 20 MG CAPS Take 1 tablet by mouth daily.    [provider]  chlorthalidone  (HYGROTON ) 25 MG tablet TAKE 1 tablet in the morning with food Orally ONCE DAY Patient taking differently: Take 25 mg by mouth daily. 07/11/24   Court Dorn PARAS, MD  Cholecalciferol  2000 units CAPS Take 2,000 Units by mouth every evening.    [provider]  diltiazem  (CARTIA  XT) 180 MG 24 hr capsule TAKE ONE CAPSULE BY MOUTH DAILY 07/26/23   Court Dorn PARAS, MD  ELIQUIS   2.5 MG TABS tablet TAKE ONE TABLET BY MOUTH TWICE DAILY. 01/31/24   Court Dorn PARAS, MD  Flaxseed, Linseed, (FLAXSEED OIL) 1000 MG CAPS Take 1,000 mg by mouth 2 (two) times daily.     [provider]  levothyroxine  (SYNTHROID ) 112 MCG tablet Take 112 mcg by mouth daily before breakfast.     [provider]  magnesium  hydroxide (MILK OF MAGNESIA) 400 MG/5ML suspension Take 30 mLs by mouth daily as needed for mild constipation.    [provider]  metoprolol  tartrate (LOPRESSOR ) 50 MG tablet take 1 tablet with food Orally Twice a day 90 days Patient taking differently: Take 50 mg by mouth 2 (two) times daily. 11/28/23   Court Dorn PARAS, MD  Multiple Vitamins-Minerals (PRESERVISION AREDS 2+MULTI VIT PO) Take 1 capsule by mouth 2 (two) times daily.    [provider]  senna (SENOKOT) 8.6 MG tablet Take 2 tablets by mouth at bedtime.    [provider]  sulfamethoxazole -trimethoprim  (BACTRIM ) 400-80 MG tablet Take 1 tablet by mouth 2 (two) times daily. 06/24/24   Fleeta Kathie Jomarie LOISE, MD  temazepam  (RESTORIL ) 30 MG capsule Take 1 capsule (30 mg total) by mouth at bedtime as needed for sleep. 10/20/20   Charlanne Fredia CROME, MD   DG Chest 1 View Result Date: 07/21/2024 EXAM: 1 VIEW XRAY OF THE CHEST 07/21/2024 09:34:00 AM COMPARISON: 07/25/2017 CLINICAL HISTORY: Fall. Fell off toilet and now has right upper leg pain. FINDINGS: LUNGS AND PLEURA: No focal pulmonary opacity. No pulmonary edema. No pleural effusion. No pneumothorax. HEART AND MEDIASTINUM: Moderate cardiomegaly. Atherosclerosis in the transverse aorta. BONES AND SOFT TISSUES: No acute osseous abnormality. Skin fold over the superolateral right hemithorax. IMPRESSION: 1. No acute process. 2. Moderate cardiomegaly and atherosclerosis in the transverse aorta. Electronically signed by: Rockey Kilts MD 07/21/2024 09:58 AM EDT RP Workstation: [id number]   DG Femur Min 2 Views Right Result Date: 07/21/2024 EXAM:  2 VIEW(S)  XRAY OF THE RIGHT FEMUR 07/21/2024 09:34:00 AM COMPARISON: CT Head dated 01/25/2016. CLINICAL HISTORY: Fall. Fell off toilet and now has right upper leg pain. FINDINGS: BONES AND JOINTS: Right hip hemiarthroplasty. Total knee arthroplasty. Osteopenia. Oblique fracture of the mid to distal femoral shaft with nearly 1 shaft width of medial displacement of distal fracture fragment. Extensive overlap and angulation on the lateral view, apex anterior. No intraarticular extension. SOFT TISSUES: Vascular calcifications. IMPRESSION: 1. Oblique fracture of the mid to distal femoral shaft with nearly 1 shaft width of medial displacement of the distal fracture fragment, extensive overlap, and apex anterior angulation on the lateral view. No intraarticular extension. 2. Right hip hemiarthroplasty and total knee arthroplasty. 3. Osteopenia. Electronically signed by: Rockey Kilts MD 07/21/2024 09:56 AM EDT RP Workstation: [id number]    Positive ROS: All other systems have been reviewed and were otherwise negative with the exception of those mentioned in the HPI and as above.  Objective: Labs cbc Recent Labs    07/21/24 0849  WBC 9.1  HGB 16.0*  HCT 48.6*  PLT 265    Labs inflam No results for input(s): CRP in the last 72 hours.  Invalid input(s): ESR  Labs coag No results for input(s): INR, PTT in the last 72 hours.  Invalid input(s): PT  Recent Labs    07/21/24 0849  NA 138  K 3.5  CL 99  CO2 27  GLUCOSE 150*  BUN 20  CREATININE 1.20*  CALCIUM  10.9*    Physical Exam: Vitals:   07/21/24 1150 07/21/24 1233  BP: (!) 194/100   Pulse: 80   Resp: 16   Temp:  98 F (36.7 C)  SpO2: 94%    General: Alert, no acute distress.  Laying supine in bed, calm, comfortable Mental status: Alert and Oriented x3 Neurologic: Speech Clear and organized, no gross focal findings or movement disorder appreciated. Respiratory: No cyanosis, no use of accessory  musculature Cardiovascular: No pedal edema GI: Abdomen is soft and non-tender, non-distended. Skin: Warm and dry.  No lesions in the area of chief complaint. Well healed surgical incision to anterior knee. Extremities: Warm and well perfused w/o edema Psychiatric: Patient is competent for consent with normal mood and affect  MUSCULOSKELETAL:  RLE shortened and internally rotated, TTP distal thigh through proximal shin, thigh indurated, knee effusion, no erythema, slight ecchymosis to knee, Buck's traction in place, can wiggle toes, NVI  Other extremities are atraumatic with painless ROM and NVI.  Assessment / Plan: Principal Problem:   Femur fracture, right (HCC)   Spoke with patient and one of her sons about the conservative vs operative treatment options. She has a severe right distal femur fracture that if untreated would leave her essentially bed bound/wheelchair bound. This increases the risk of blood clots, PNA, pressure sores to name a few. Her arthroplasty components appear to be in good position still and the fracture doesn't extend to the joint.   Surgical fixation would involve reducing the fracture and placement of a short IM nail (since have to take into consideration the femoral stem from her hip arthroplasty) or ORIF with plate and screws. Her age and risk of anesthesia complications is an obvious concern for her and her family but they also don't want her to be left crippled for the rest of her life. I explained that she may be a candidate for spinal vs general anesthesia to help with this. Cognitively she is very with it right now and shows no signs of diminished  awareness of what is going on.  She has another son Lemond who is a physician and is on his way to the hospital now. The patient and son currently in the room want to wait to make a final decision once Lemond gets there and speaks with them. I told them they could have the nurse call me or Dr. Beverley if needed once he  arrives. They seem to be leaning towards wanting to move forward with surgery with the hope of making her ambulatory again. I will make her NPO at midnight for the possible surgery tomorrow with Dr. Celena.   Dr. Celena and his team will assess the patient in the morning to make final decisions.   Weightbearing: NWB RLE Orthopedic device(s): Buck's traction 5lbs Showering: hold VTE prophylaxis: Eliquis  on hold for possible surgery tomorrow  Pain control: PRN, strongly advise limiting any narcotic use due to age and risk of delirium Follow - up plan: TBD Contact information for today:  Evalene Beverley MD, [facility] PA-C   Gerard CHRISTELLA Large PA-C Office [phone] 07/21/2024 2:57 PM

## 2024-07-21 NOTE — H&P (Signed)
 History and Physical    Patient: Leah Holland DOB: 1922/08/21 DOA: 07/21/2024 DOS: the patient was seen and examined on 07/21/2024 PCP: [name], MD (Inactive)  Patient coming from: ALF/ILF  Chief Complaint:  Chief Complaint  Patient presents with   Fall   HPI: Leah Holland is a 89 y.o. 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  Pt is a limited historian. From what I can gather, she was in her USOH until waking this morning around 0700 and on her way to the restroom she fell onto her R hip. She denies headstrike, but is unable to determine if she had LOC since she can't remember all the details of her fall. Of note, she had a similar GLF a week ago while walking on flat ground from her living room to her kitchen; of note, she is unable to provide the exact details of that fall, but remembers falling backwards onto her butt. She denies any new meds or sick contacts.  In the ED, pt hypertensive and tachypneic on RA. Labs notable for Cr 1.2, and troponin 8-->11. XR RLE showed distal femur fracture. EDP consulted orthopedic surgery who requested medicine admission with plan for operative repair tomorrow.   Review of Systems: As mentioned in the history of present illness. All other systems reviewed and are negative. Past Medical History:  Diagnosis Date   Anxiety attack    take RX prn (08/13/2013)   Arthritis    probably (08/13/2013)   Atrial fibrillation (HCC)    new onset/pt report 08/13/2013   Bladder wall thickening 07/01/2021   CT scan in Epic   Bronchitis 12/2011   Cholelithiasis 07/01/2021   CT in Epic.   Common bile duct dilation 07/01/2021   CT in Epic:  13 mm.   Complication of anesthesia     Compression fracture of first lumbar vertebra (HCC) 07/01/2021   CT in Epic.   Depression    Femur fracture (HCC)    left proximal femur non-union   GERD (gastroesophageal reflux disease)    Grieving 05/03/2023   H/O hiatal hernia    Hardware complicating wound infection (HCC) 08/22/2016   History of blood transfusion    w/both knee OR's (08/13/2013)   Hyperlipidemia    Hyperlipidemia    Hypertension    Hypertensive urgency 10/22/2018   Hypothyroidism    takes synthroid    Insomnia    Intertrochanteric fracture of right hip (HCC) 07/30/2015   Neuromuscular disorder (HCC)    carpal tunnel in right hand   Periprosthetic fracture around internal prosthetic right hip joint (HCC), nonunion basicervical femoral neck with broken hardware and subtrochanteric fracture 03/16/2016   PONV (postoperative nausea and vomiting)    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 Court, cardiologist 1 x year [phone]   Tendon dysfunction 08/22/2016   Wears hearing aid    [facility]   Past Surgical History:  Procedure Laterality Date   2D ECHOCARDIOGRAM  12/07/2004   EF 48%, moderate  pulmonary hypertension   BREAST BIOPSY Left ?1970's   CARDIOVASCULAR STRESS TEST  12/07/11   CARDIOVASCULAR STRESS TEST  12/07/2011   LEXISCAN , normal scan, no significant wall abnormalities noted   CARPAL TUNNEL RELEASE Right ~ 2005   CATARACT EXTRACTION, BILATERAL Bilateral 2000   CYSTOSCOPY  11/02/2021   Procedure: CYSTOSCOPY;  Surgeon: Viktoria Comer SAUNDERS, MD;  Location: [facility];  Service: Gynecology;;   DILATION AND CURETTAGE OF UTERUS  1950's   DOPPLER ECHOCARDIOGRAPHY  12/07/11   EYE SURGERY     FEMUR IM NAIL Right 08/01/2015   Procedure: INTRAMEDULLARY (IM) NAIL  FEMORAL;  Surgeon: Fonda Olmsted, MD;  Location: [facility] OR;  Service: Orthopedics;  Laterality: Right;   HARDWARE REMOVAL Right 03/16/2016   Procedure: HARDWARE REMOVAL;  Surgeon: Fonda Olmsted, MD;  Location: [facility] OR;  Service: Orthopedics;  Laterality: Right;   INTRAMEDULLARY (IM) NAIL INTERTROCHANTERIC Left 01/26/2017   Procedure: INTRAMEDULLARY (IM) NAIL INTERTROCHANTRIC;  Surgeon: Kay CHRISTELLA Cummins, MD;  Location: [facility] OR;  Service: Orthopedics;  Laterality: Left;   JOINT REPLACEMENT     bilateral knee replacement   ROBOTIC ASSISTED BILATERAL SALPINGO OOPHERECTOMY Bilateral 11/02/2021   Procedure: XI ROBOTIC ASSISTED BILATERAL SALPINGO OOPHORECTOMY;  Surgeon: Viktoria Comer SAUNDERS, MD;  Location: [facility];  Service: Gynecology;  Laterality: Bilateral;   ROBOTIC ASSISTED TOTAL HYSTERECTOMY N/A 11/02/2021   Procedure: XI ROBOTIC ASSISTED TOTAL HYSTERECTOMY WITH  MINI LAPAROTOMY;  Surgeon: Viktoria Comer SAUNDERS, MD;  Location: [facility];  Service: Gynecology;  Laterality: N/A;   SKIN CANCER EXCISION  2000's   off my nose (08/13/2013)   TOTAL HIP ARTHROPLASTY Right 03/16/2016   Procedure: TOTAL HIP ARTHROPLASTY;  Surgeon: Fonda Olmsted, MD;  Location: [facility] OR;  Service: Orthopedics;  Laterality: Right;   TOTAL HIP ARTHROPLASTY Left 06/09/2017   Procedure: REMOVE LEFT HIP INTRAMEDULLARY NAIL AND CONVERT TO LEFT CEMENTED HEMI HIP ARTHROPLASTY;  Surgeon: Cummins Kay CHRISTELLA, MD;  Location: [facility] OR;  Service: Orthopedics;  Laterality: Left;   TOTAL KNEE ARTHROPLASTY Right 2005   TOTAL KNEE ARTHROPLASTY  03/02/2012   Procedure: TOTAL KNEE ARTHROPLASTY;  Surgeon: LELON JONETTA Shari Mickey., MD;  Location: [facility] OR;  Service: Orthopedics;  Laterality: Left;   Social History:  reports that she has never smoked. She has never used smokeless tobacco. She reports that she does not drink alcohol and does not use drugs.  Allergies  Allergen Reactions   Levaquin [Levofloxacin] Other (See Comments)    tendinopathy   Morphine  And Codeine Other (See  Comments)    Made pt wild   Morphine  Sulfate     Other reaction(s): made pt wild    Family History  Problem Relation Age of Onset   Arrhythmia Mother    Heart disease Mother    Stroke Father 6   Arrhythmia Sister    Heart disease Sister    Heart disease Sister    Stroke Sister    Diabetes Sister    Heart disease Brother    Heart disease Brother    Cancer Brother        Colon cancer   Arrhythmia Brother    Heart disease Brother    Heart disease Child 46   Heart disease Child 36   Breast cancer Neg Hx    Ovarian cancer Neg Hx    Endometrial cancer Neg Hx    Pancreatic cancer Neg Hx    Prostate cancer Neg Hx     Prior to Admission medications   Medication Sig Start Date End  Date Taking? Authorizing Provider  acetaminophen  (TYLENOL ) 325 MG tablet Take 650 mg by mouth every 6 (six) hours as needed for moderate pain.     [provider]  Apoaequorin (PREVAGEN EXTRA STRENGTH) 20 MG CAPS Take 1 tablet by mouth daily.    [provider]  chlorthalidone  (HYGROTON ) 25 MG tablet TAKE 1 tablet in the morning with food Orally ONCE DAY 07/11/24   Court Dorn PARAS, MD  Cholecalciferol  2000 units CAPS Take 2,000 Units by mouth every evening.    [provider]  diltiazem  (CARTIA  XT) 180 MG 24 hr capsule TAKE ONE CAPSULE BY MOUTH DAILY 07/26/23   Court Dorn PARAS, MD  ELIQUIS  2.5 MG TABS tablet TAKE ONE TABLET BY MOUTH TWICE DAILY. 01/31/24   Court Dorn PARAS, MD  Flaxseed, Linseed, (FLAXSEED OIL) 1000 MG CAPS Take 1,000 mg by mouth 2 (two) times daily.     [provider]  levothyroxine  (SYNTHROID ) 112 MCG tablet Take 112 mcg by mouth daily before breakfast.     [provider]  magnesium  hydroxide (MILK OF MAGNESIA) 400 MG/5ML suspension Take 30 mLs by mouth daily as needed for mild constipation.    [provider]  metoprolol  tartrate (LOPRESSOR ) 50 MG tablet take 1 tablet with food Orally Twice a day 90 days 11/28/23   Court Dorn PARAS, MD  Multiple Vitamins-Minerals (PRESERVISION AREDS 2+MULTI VIT PO) Take 1 capsule by mouth 2 (two) times daily.    [provider]  senna (SENOKOT) 8.6 MG tablet Take 2 tablets by mouth at bedtime.    [provider]  sulfamethoxazole -trimethoprim  (BACTRIM ) 400-80 MG tablet Take 1 tablet by mouth 2 (two) times daily. 06/24/24   Fleeta Kathie Jomarie LOISE, MD  temazepam  (RESTORIL ) 30 MG capsule Take 1 capsule (30 mg total) by mouth at bedtime as needed for sleep. 10/20/20   Charlanne Fredia CROME, MD    Physical Exam: Vitals:   07/21/24 1007 07/21/24 1130 07/21/24 1150 07/21/24 1233  BP:  (!) 204/109 (!) 194/100   Pulse: 76 78 80   Resp:  (!) 22 16   Temp:    98 F (36.7 C)  TempSrc:      SpO2:  91% 94%   Weight:      Height:       General: Alert, oriented x3, resting comfortably in no acute distress Respiratory: Lungs clear to auscultation bilaterally with normal respiratory effort; no w/r/r Cardiovascular: Regular rate and rhythm w/o m/r/g   Data Reviewed:  Lab Results  Component Value Date   WBC 9.1 07/21/2024   HGB 16.0 (H) 07/21/2024   HCT 48.6 (H) 07/21/2024   MCV 95.9 07/21/2024   PLT 265 07/21/2024   Lab Results  Component Value Date   GLUCOSE 150 (H) 07/21/2024   CALCIUM  10.9 (H) 07/21/2024   NA 138 07/21/2024   K 3.5 07/21/2024   CO2 27 07/21/2024   CL 99 07/21/2024   BUN 20 07/21/2024   CREATININE 1.20 (H) 07/21/2024   Lab Results  Component Value Date   ALT 20 07/21/2024   AST 22 07/21/2024   ALKPHOS 105 07/21/2024   BILITOT 0.9 07/21/2024   Lab Results  Component Value Date   INR 1.03 06/09/2017   INR 1.18 01/27/2017   INR 1.06 01/26/2017    Radiology: DG Chest 1 View Result Date: 07/21/2024 EXAM: 1 VIEW XRAY OF THE CHEST 07/21/2024 09:34:00 AM COMPARISON: 07/25/2017 CLINICAL HISTORY: Fall. Fell off toilet and now has right upper leg  pain. FINDINGS: LUNGS AND PLEURA: No focal pulmonary opacity. No pulmonary edema. No pleural  effusion. No pneumothorax. HEART AND MEDIASTINUM: Moderate cardiomegaly. Atherosclerosis in the transverse aorta. BONES AND SOFT TISSUES: No acute osseous abnormality. Skin fold over the superolateral right hemithorax. IMPRESSION: 1. No acute process. 2. Moderate cardiomegaly and atherosclerosis in the transverse aorta. Electronically signed by: Rockey Kilts MD 07/21/2024 09:58 AM EDT RP Workstation: [id number]   DG Femur Min 2 Views Right Result Date: 07/21/2024 EXAM: 2 VIEW(S) XRAY OF THE RIGHT FEMUR 07/21/2024 09:34:00 AM COMPARISON: CT Head dated 01/25/2016. CLINICAL HISTORY: Fall. Fell off toilet and now has right upper leg pain. FINDINGS: BONES AND JOINTS: Right hip hemiarthroplasty. Total knee arthroplasty. Osteopenia. Oblique fracture of the mid to distal femoral shaft with nearly 1 shaft width of medial displacement of distal fracture fragment. Extensive overlap and angulation on the lateral view, apex anterior. No intraarticular extension. SOFT TISSUES: Vascular calcifications. IMPRESSION: 1. Oblique fracture of the mid to distal femoral shaft with nearly 1 shaft width of medial displacement of the distal fracture fragment, extensive overlap, and apex anterior angulation on the lateral view. No intraarticular extension. 2. Right hip hemiarthroplasty and total knee arthroplasty. 3. Osteopenia. 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  RLE distal femur fracture 2/2 GLF -Orthopedic surgery consulted; apprec eval/recs -Multimodal pain control w/ Tylenol  1g TID, gabapentin  300mg  TID, and oxycodone  5mg  q4h prn -NPO at midnight for planned operative  repair  Syncope -PT/OT consulted; apprec eval/recs -Tele -MIVF: NS at 100cc/h for 24h -F/u EEG; if abnl consult Neurology -F/u TTE to eval for structural/valvular heart disease -Consider 14d Holter monitor on discharge if no other obvious etiology   Advance Care Planning:   Code Status: Full Code   Consults: Orthopedic surgery  Family Communication: Son  Severity of Illness: The appropriate patient status for this patient is INPATIENT. Inpatient status is judged to be reasonable and necessary in order to provide the required intensity of service to ensure the patient's safety. The patient's presenting symptoms, physical exam findings, and initial radiographic and laboratory data in the context of their chronic comorbidities is felt to place them at high risk for further clinical deterioration. Furthermore, it is not anticipated that the patient will be medically stable for discharge from the hospital within 2 midnights of admission.   * I certify that at the point of admission it is my clinical judgment that the patient will require inpatient hospital care spanning beyond 2 midnights from the point of admission due to high intensity of service, high risk for further deterioration and high frequency of surveillance required.*   ------- I spent 57 minutes reviewing previous notes, at the bedside counseling/discussing the treatment plan, and performing clinical documentation.  Author: Marsha Ada, MD 07/21/2024 1:56 PM  For on call review [email or web address].

## 2024-07-21 NOTE — Plan of Care (Signed)
   Problem: Education: Goal: Knowledge of General Education information will improve Description: Including pain rating scale, medication(s)/side effects and non-pharmacologic comfort measures Outcome: Progressing   Problem: Health Behavior/Discharge Planning: Goal: Ability to manage health-related needs will improve Outcome: Progressing   Problem: Clinical Measurements: Goal: Will remain free from infection Outcome: Progressing

## 2024-07-21 NOTE — ED Notes (Signed)
 Patient transported to X-ray

## 2024-07-21 NOTE — ED Notes (Signed)
 EDP at bedside

## 2024-07-21 NOTE — ED Provider Notes (Signed)
 [place] EMERGENCY DEPARTMENT AT [facility] Provider Note   CSN: [id number] Arrival date & time: 07/21/24  9165     Patient presents with: [place]   Leah Holland is a 89 y.o. female.   HPI 89 year old female presents with a fall.  She was trying to go to the bathroom and thinks she might of gotten dizzy and fell.  She does not think she lost consciousness.  She did not hit her head.  She is primarily concerned with right knee/thigh pain.  She denies any numbness in the leg or foot.  No chest pain or shortness of breath.  She fell last week and thinks that time was due to a similar type dizziness but she thinks she might of passed out that time.  She has had a previous right knee replacement.  She is on Eliquis  and last took it last night.  Prior to Admission medications   Medication Sig Start Date End Date Taking? Authorizing Provider  acetaminophen  (TYLENOL ) 325 MG tablet Take 650 mg by mouth every 6 (six) hours as needed for moderate pain.     [provider]  Apoaequorin (PREVAGEN EXTRA STRENGTH) 20 MG CAPS Take 1 tablet by mouth daily.    [provider]  chlorthalidone  (HYGROTON ) 25 MG tablet TAKE 1 tablet in the morning with food Orally ONCE DAY 07/11/24   Court Dorn PARAS, MD  Cholecalciferol  2000 units CAPS Take 2,000 Units by mouth every evening.    [provider]  diltiazem  (CARTIA  XT) 180 MG 24 hr capsule TAKE ONE CAPSULE BY MOUTH DAILY 07/26/23   Court Dorn PARAS, MD  ELIQUIS  2.5 MG TABS tablet TAKE ONE TABLET BY MOUTH TWICE DAILY. 01/31/24   Court Dorn PARAS, MD  Flaxseed, Linseed, (FLAXSEED OIL) 1000 MG CAPS Take 1,000 mg by mouth 2 (two) times daily.     [provider]  levothyroxine  (SYNTHROID ) 112 MCG tablet Take 112 mcg by mouth daily before breakfast.     [provider]  magnesium  hydroxide (MILK OF MAGNESIA) 400 MG/5ML suspension Take 30 mLs by mouth daily as needed for mild constipation.    [provider]  metoprolol  tartrate (LOPRESSOR ) 50 MG tablet take 1 tablet with food Orally Twice a day 90 days 11/28/23   Court Dorn PARAS, MD  Multiple Vitamins-Minerals (PRESERVISION AREDS 2+MULTI VIT PO) Take 1 capsule by mouth 2 (two) times daily.    [provider]  senna (SENOKOT) 8.6 MG tablet Take 2 tablets by mouth at bedtime.    [provider]  sulfamethoxazole -trimethoprim  (BACTRIM ) 400-80 MG tablet Take 1 tablet by mouth 2 (two) times daily. 06/24/24   Fleeta Kathie Jomarie LOISE, MD  temazepam  (RESTORIL ) 30 MG capsule Take 1 capsule (30 mg total) by mouth at bedtime as needed for sleep. 10/20/20   Charlanne Fredia CROME, MD    Allergies: Levaquin [levofloxacin], Morphine  and codeine, and Morphine  sulfate    Review of Systems  Respiratory:  Negative for shortness of breath.   Cardiovascular:  Negative for chest pain.  Musculoskeletal:  Positive for arthralgias.  Neurological:  Negative for weakness, numbness and headaches.    Updated Vital Signs BP (!) 194/100   Pulse 80   Temp 98 F (36.7 C)   Resp 16   Ht 5' 3 (1.6 m)   Wt 59 kg   SpO2 94%   BMI 23.03 kg/m   Physical Exam Vitals and nursing note reviewed.  Constitutional:  Appearance: She is well-developed.  HENT:     Head: Normocephalic and atraumatic.  Cardiovascular:     Rate and Rhythm: Normal rate and regular rhythm.     Pulses:          Dorsalis pedis pulses are 2+ on the right side and 2+ on the left side.     Heart sounds: Normal heart sounds.  Pulmonary:     Effort: Pulmonary effort is normal.     Breath sounds: Normal breath sounds.  Abdominal:     General: There is no distension.  Musculoskeletal:     Right hip: No tenderness.     Right upper leg: Swelling, deformity and tenderness present.     Right knee: Tenderness present.     Comments: Grossly normal sensation and movement in right foot.  Skin:    General: Skin is warm and dry.  Neurological:     Mental Status: She is alert.      (all labs ordered are listed, but only abnormal results are displayed) Labs Reviewed  COMPREHENSIVE METABOLIC PANEL WITH GFR - Abnormal; Notable for the following components:      Result Value   Glucose, Bld 150 (*)    Creatinine, Ser 1.20 (*)    Calcium  10.9 (*)    GFR, Estimated 41 (*)    All other components within normal limits  CBC WITH DIFFERENTIAL/PLATELET - Abnormal; Notable for the following components:   Hemoglobin 16.0 (*)    HCT 48.6 (*)    All other components within normal limits  TROPONIN I (HIGH SENSITIVITY)  TROPONIN I (HIGH SENSITIVITY)    EKG: EKG Interpretation Date/Time:  Sunday July 21 2024 08:47:52 EDT Ventricular Rate:  79 PR Interval:    QRS Duration:  107 QT Interval:  413 QTC Calculation: 474 R Axis:   43  Text Interpretation: Atrial fibrillation Low voltage, extremity and precordial leads Probable anteroseptal infarct, old Confirmed by Freddi Hamilton [phone]) on 07/21/2024 9:39:58 AM  Radiology: DG Chest 1 View Result Date: 07/21/2024 EXAM: 1 VIEW XRAY OF THE CHEST 07/21/2024 09:34:00 AM COMPARISON: 07/25/2017 CLINICAL HISTORY: Fall. Fell off toilet and now has right upper leg pain. FINDINGS: LUNGS AND PLEURA: No focal pulmonary opacity. No pulmonary edema. No pleural effusion. No pneumothorax. HEART AND MEDIASTINUM: Moderate cardiomegaly. Atherosclerosis in the transverse aorta. BONES AND SOFT TISSUES: No acute osseous abnormality. Skin fold over the superolateral right hemithorax. IMPRESSION: 1. No acute process. 2. Moderate cardiomegaly and atherosclerosis in the transverse aorta. Electronically signed by: Rockey Kilts MD 07/21/2024 09:58 AM EDT RP Workstation: [id number]   DG Femur Min 2 Views Right Result Date: 07/21/2024 EXAM: 2 VIEW(S) XRAY OF THE RIGHT FEMUR 07/21/2024 09:34:00 AM COMPARISON: CT Head dated 01/25/2016. CLINICAL HISTORY: Fall. Fell off toilet and now has right upper leg pain. FINDINGS: BONES AND JOINTS: Right hip  hemiarthroplasty. Total knee arthroplasty. Osteopenia. Oblique fracture of the mid to distal femoral shaft with nearly 1 shaft width of medial displacement of distal fracture fragment. Extensive overlap and angulation on the lateral view, apex anterior. No intraarticular extension. SOFT TISSUES: Vascular calcifications. IMPRESSION: 1. Oblique fracture of the mid to distal femoral shaft with nearly 1 shaft width of medial displacement of the distal fracture fragment, extensive overlap, and apex anterior angulation on the lateral view. No intraarticular extension. 2. Right hip hemiarthroplasty and total knee arthroplasty. 3. Osteopenia. Electronically signed by: Rockey Kilts MD 07/21/2024 09:56 AM EDT RP Workstation: [id number]     Procedures   Medications Ordered  in the ED  chlorthalidone  (HYGROTON ) tablet 25 mg (has no administration in time range)  diltiazem  (CARDIZEM  CD) 24 hr capsule 180 mg (has no administration in time range)  levothyroxine  (SYNTHROID ) tablet 112 mcg (has no administration in time range)  acetaminophen  (TYLENOL ) tablet 1,000 mg (has no administration in time range)  oxyCODONE  (Oxy IR/ROXICODONE ) immediate release tablet 5 mg (has no administration in time range)  gabapentin  (NEURONTIN ) capsule 300 mg (has no administration in time range)  HYDROmorphone  (DILAUDID ) injection 0.5 mg (has no administration in time range)  lactated ringers  bolus 500 mL (0 mLs Intravenous Stopped 07/21/24 1138)  fentaNYL  (SUBLIMAZE ) injection 50 mcg (50 mcg Intravenous Given 07/21/24 0856)  HYDROmorphone  (DILAUDID ) injection 0.5 mg (0.5 mg Intravenous Given 07/21/24 0942)                                    Medical Decision Making Amount and/or Complexity of Data Reviewed Labs: ordered.    Details: Normal WBC Radiology: ordered and independent interpretation performed.    Details: Mid to distal femur fracture ECG/medicine tests: ordered and independent interpretation performed.    Details:  A-fib  Risk Prescription drug management. Decision regarding hospitalization.   Patient presents with a closed femur fracture.  Pain seems to be well-controlled after some IV narcotics.  There is concerned that she might of syncopized based on her symptoms and falling last week.  Patient has had multiple orthopedist in the past, so after discussion, ultimately I discussed with Dr. Beverley and he will consult and recommends patient be n.p.o. after midnight.  Hold Eliquis  for now.  Discussed with Dr. Georgina, who will admit.  I made him aware that the patient might of had syncope these last couple times.  No signs or symptoms of head injury so I do not think head CT is needed.     Final diagnoses:  Closed fracture of right femur, unspecified fracture morphology, initial encounter [facility])    ED Discharge Orders     None          Freddi Hamilton, MD 07/21/24 1237

## 2024-07-21 NOTE — Procedures (Signed)
 Patient Name: Leah Holland  MRN: [id number]  Epilepsy Attending: Arlin MALVA Krebs  Referring Physician/Provider: Georgina Basket, MD  Date: 07/21/2024 Duration: 22.27 mins  Patient history: 89yo F with syncope. EEG to evaluate for seizure  Level of alertness: Awake  AEDs during EEG study: GBP  Technical aspects: This EEG study was done with scalp electrodes positioned according to the 10-20 International system of electrode placement. Electrical activity was reviewed with band pass filter of 1-70Hz , sensitivity of 7 uV/mm, display speed of 69mm/sec with a 60Hz  notched filter applied as appropriate. EEG data were recorded continuously and digitally stored.  Video monitoring was available and reviewed as appropriate.  Description: The posterior dominant rhythm consists of 8-9 Hz activity of moderate voltage (25-35 uV) seen predominantly in posterior head regions, symmetric and reactive to eye opening and eye closing. Hyperventilation and photic stimulation were not performed.     IMPRESSION: This study is within normal limits. No seizures or epileptiform discharges were seen throughout the recording.  A normal interictal EEG does not exclude the diagnosis of epilepsy.   Esabella Stockinger O Sia Gabrielsen

## 2024-07-21 NOTE — ED Triage Notes (Signed)
 PT BIB [facility] from her friends home after falling while on the toilet. PT denies striking head/LOC, but endorses 8/10 R Knee pain. PT is on Eliquis . Aox4.  EMS VS BP 138/98, HR 78, Spo2 94% on RA

## 2024-07-22 ENCOUNTER — Inpatient Hospital Stay (HOSPITAL_COMMUNITY)

## 2024-07-22 ENCOUNTER — Other Ambulatory Visit: Payer: Self-pay

## 2024-07-22 ENCOUNTER — Other Ambulatory Visit (HOSPITAL_COMMUNITY)

## 2024-07-22 ENCOUNTER — Encounter (HOSPITAL_COMMUNITY): Payer: Self-pay | Admitting: Hospitalist

## 2024-07-22 ENCOUNTER — Inpatient Hospital Stay (HOSPITAL_COMMUNITY): Admitting: Anesthesiology

## 2024-07-22 ENCOUNTER — Encounter (HOSPITAL_COMMUNITY)
Admission: EM | Disposition: A | Discharge status: Skilled Nursing Facility | Payer: Self-pay | Source: Skilled Nursing Facility | Attending: Internal Medicine

## 2024-07-22 DIAGNOSIS — M9711XA Periprosthetic fracture around internal prosthetic right knee joint, initial encounter: Secondary | ICD-10-CM

## 2024-07-22 DIAGNOSIS — E039 Hypothyroidism, unspecified: Secondary | ICD-10-CM | POA: Diagnosis not present

## 2024-07-22 DIAGNOSIS — F418 Other specified anxiety disorders: Secondary | ICD-10-CM

## 2024-07-22 DIAGNOSIS — I10 Essential (primary) hypertension: Secondary | ICD-10-CM

## 2024-07-22 DIAGNOSIS — S7291XA Unspecified fracture of right femur, initial encounter for closed fracture: Secondary | ICD-10-CM | POA: Diagnosis not present

## 2024-07-22 HISTORY — PX: ORIF FEMUR FRACTURE: SHX2119

## 2024-07-22 LAB — BASIC METABOLIC PANEL WITH GFR
Anion gap: 10 (ref 5–15)
BUN: 10 mg/dL (ref 8–23)
CO2: 27 mmol/L (ref 22–32)
Calcium: 9.7 mg/dL (ref 8.9–10.3)
Chloride: 100 mmol/L (ref 98–111)
Creatinine, Ser: 0.83 mg/dL (ref 0.44–1.00)
GFR, Estimated: >60 mL/min (ref >60)
Glucose, Bld: 128 mg/dL — ABNORMAL HIGH (ref 70–99)
Potassium: 3.3 mmol/L — ABNORMAL LOW (ref 3.5–5.1)
Sodium: 137 mmol/L (ref 135–145)

## 2024-07-22 LAB — SURGICAL PCR SCREEN
MRSA, PCR: NEGATIVE
Staphylococcus aureus: NEGATIVE

## 2024-07-22 SURGERY — OPEN REDUCTION INTERNAL FIXATION (ORIF) DISTAL FEMUR FRACTURE
Anesthesia: General | Laterality: Right

## 2024-07-22 MED ORDER — CEFAZOLIN SODIUM-DEXTROSE 2-4 GM/100ML-% IV SOLN
2.0000 g | Freq: Two times a day (BID) | INTRAVENOUS | Status: AC
  Administered 2024-07-22 – 2024-07-23 (×4): 2 g via INTRAVENOUS
  Filled 2024-07-22 (×2): qty 100

## 2024-07-22 MED ORDER — ROCURONIUM BROMIDE 10 MG/ML (PF) SYRINGE
PREFILLED_SYRINGE | INTRAVENOUS | Status: DC | PRN
  Administered 2024-07-22 (×2): 50 mg via INTRAVENOUS

## 2024-07-22 MED ORDER — ONDANSETRON HCL 4 MG PO TABS: 4.0000 mg | ORAL_TABLET | Freq: Four times a day (QID) | ORAL | Status: DC | PRN

## 2024-07-22 MED ORDER — PROPOFOL 10 MG/ML IV BOLUS
INTRAVENOUS | Status: DC | PRN
  Administered 2024-07-22 (×2): 70 mg via INTRAVENOUS

## 2024-07-22 MED ORDER — CEFAZOLIN SODIUM-DEXTROSE 2-4 GM/100ML-% IV SOLN
INTRAVENOUS | Status: AC
  Filled 2024-07-22: qty 100

## 2024-07-22 MED ORDER — POTASSIUM CHLORIDE CRYS ER 20 MEQ PO TBCR
40.0000 meq | EXTENDED_RELEASE_TABLET | Freq: Once | ORAL | Status: AC
Start: 2024-07-22 — End: 2024-07-22
  Administered 2024-07-22 (×2): 40 meq via ORAL
  Filled 2024-07-22: qty 2

## 2024-07-22 MED ORDER — 0.9 % SODIUM CHLORIDE (POUR BTL) OPTIME
TOPICAL | Status: DC | PRN
  Administered 2024-07-22 (×2): 1000 mL

## 2024-07-22 MED ORDER — LIDOCAINE 2% (20 MG/ML) 5 ML SYRINGE
INTRAMUSCULAR | Status: DC | PRN
  Administered 2024-07-22 (×2): 60 mg via INTRAVENOUS

## 2024-07-22 MED ORDER — METOPROLOL TARTRATE 50 MG PO TABS
50.0000 mg | ORAL_TABLET | Freq: Two times a day (BID) | ORAL | Status: DC
  Administered 2024-07-22 – 2024-07-25 (×13): 50 mg via ORAL
  Filled 2024-07-22 (×7): qty 1

## 2024-07-22 MED ORDER — DOCUSATE SODIUM 100 MG PO CAPS
100.0000 mg | ORAL_CAPSULE | Freq: Two times a day (BID) | ORAL | Status: DC
  Administered 2024-07-22 – 2024-07-25 (×11): 100 mg via ORAL
  Filled 2024-07-22 (×6): qty 1

## 2024-07-22 MED ORDER — CEFAZOLIN SODIUM-DEXTROSE 2-4 GM/100ML-% IV SOLN
2.0000 g | Freq: Once | INTRAVENOUS | Status: AC
  Administered 2024-07-22 (×2): 2 g via INTRAVENOUS

## 2024-07-22 MED ORDER — FENTANYL CITRATE (PF) 250 MCG/5ML IJ SOLN
INTRAMUSCULAR | Status: AC
  Filled 2024-07-22: qty 5

## 2024-07-22 MED ORDER — SUGAMMADEX SODIUM 200 MG/2ML IV SOLN
INTRAVENOUS | Status: DC | PRN
  Administered 2024-07-22 (×2): 200 mg via INTRAVENOUS

## 2024-07-22 MED ORDER — CHLORHEXIDINE GLUCONATE 0.12 % MT SOLN: 15.0000 mL | Freq: Once | OROMUCOSAL | Status: AC

## 2024-07-22 MED ORDER — PROPOFOL 500 MG/50ML IV EMUL
INTRAVENOUS | Status: DC | PRN
  Administered 2024-07-22 (×2): 100 ug/kg/min via INTRAVENOUS

## 2024-07-22 MED ORDER — LACTATED RINGERS IV SOLN
INTRAVENOUS | Status: DC | PRN
Start: 2024-07-22 — End: 2024-07-22

## 2024-07-22 MED ORDER — VANCOMYCIN HCL 1000 MG IV SOLR
INTRAVENOUS | Status: AC
Start: 2024-07-22 — End: 2024-07-22
  Filled 2024-07-22: qty 20

## 2024-07-22 MED ORDER — ALBUMIN HUMAN 5 % IV SOLN: INTRAVENOUS | Status: DC | PRN

## 2024-07-22 MED ORDER — METHOCARBAMOL 500 MG PO TABS: 500.0000 mg | ORAL_TABLET | Freq: Three times a day (TID) | ORAL | Status: DC | PRN

## 2024-07-22 MED ORDER — DEXAMETHASONE SODIUM PHOSPHATE 10 MG/ML IJ SOLN
INTRAMUSCULAR | Status: DC | PRN
  Administered 2024-07-22 (×2): 5 mg via INTRAVENOUS

## 2024-07-22 MED ORDER — FENTANYL CITRATE (PF) 100 MCG/2ML IJ SOLN: 25.0000 ug | INTRAMUSCULAR | Status: DC | PRN

## 2024-07-22 MED ORDER — VANCOMYCIN HCL 1000 MG IV SOLR
INTRAVENOUS | Status: DC | PRN
Start: 2024-07-22 — End: 2024-07-22
  Administered 2024-07-22 (×2): 1000 mg

## 2024-07-22 MED ORDER — LACTATED RINGERS IV SOLN: INTRAVENOUS | Status: DC

## 2024-07-22 MED ORDER — FENTANYL CITRATE (PF) 250 MCG/5ML IJ SOLN
INTRAMUSCULAR | Status: DC | PRN
  Administered 2024-07-22: 50 ug via INTRAVENOUS
  Administered 2024-07-22: 25 ug via INTRAVENOUS
  Administered 2024-07-22 (×2): 50 ug via INTRAVENOUS
  Administered 2024-07-22: 25 ug via INTRAVENOUS
  Administered 2024-07-22: 50 ug via INTRAVENOUS

## 2024-07-22 MED ORDER — ONDANSETRON HCL 4 MG/2ML IJ SOLN
INTRAMUSCULAR | Status: DC | PRN
  Administered 2024-07-22 (×2): 4 mg via INTRAVENOUS

## 2024-07-22 MED ORDER — PHENYLEPHRINE HCL-NACL 20-0.9 MG/250ML-% IV SOLN
INTRAVENOUS | Status: DC | PRN
  Administered 2024-07-22 (×2): 50 ug/min via INTRAVENOUS

## 2024-07-22 MED ORDER — METOCLOPRAMIDE HCL 5 MG/ML IJ SOLN: 5.0000 mg | Freq: Three times a day (TID) | INTRAMUSCULAR | Status: DC | PRN

## 2024-07-22 MED ORDER — POLYETHYLENE GLYCOL 3350 17 G PO PACK
17.0000 g | PACK | Freq: Every day | ORAL | Status: DC | PRN
  Administered 2024-07-24 (×2): 17 g via ORAL
  Filled 2024-07-22: qty 1

## 2024-07-22 MED ORDER — PANTOPRAZOLE SODIUM 40 MG PO TBEC
40.0000 mg | DELAYED_RELEASE_TABLET | Freq: Every day | ORAL | Status: DC
  Administered 2024-07-22 – 2024-07-25 (×7): 40 mg via ORAL
  Filled 2024-07-22 (×4): qty 1

## 2024-07-22 MED ORDER — SULFAMETHOXAZOLE-TRIMETHOPRIM 400-80 MG PO TABS
1.0000 | ORAL_TABLET | Freq: Two times a day (BID) | ORAL | Status: DC
  Administered 2024-07-22 – 2024-07-23 (×6): 1 via ORAL
  Filled 2024-07-22 (×3): qty 1

## 2024-07-22 MED ORDER — HYDROMORPHONE HCL 1 MG/ML IJ SOLN: 0.5000 mg | INTRAMUSCULAR | Status: DC | PRN

## 2024-07-22 MED ORDER — TEMAZEPAM 7.5 MG PO CAPS
15.0000 mg | ORAL_CAPSULE | Freq: Once | ORAL | Status: AC
Start: 2024-07-22 — End: 2024-07-22
  Administered 2024-07-22 (×2): 15 mg via ORAL
  Filled 2024-07-22: qty 2

## 2024-07-22 MED ORDER — ORAL CARE MOUTH RINSE: 15.0000 mL | Freq: Once | OROMUCOSAL | Status: AC

## 2024-07-22 MED ORDER — METHOCARBAMOL 1000 MG/10ML IJ SOLN: 500.0000 mg | Freq: Three times a day (TID) | INTRAMUSCULAR | Status: DC | PRN

## 2024-07-22 MED ORDER — OXYCODONE HCL 5 MG PO TABS: 2.5000 mg | ORAL_TABLET | ORAL | Status: DC | PRN

## 2024-07-22 MED ORDER — ONDANSETRON HCL 4 MG/2ML IJ SOLN: 4.0000 mg | Freq: Four times a day (QID) | INTRAMUSCULAR | Status: DC | PRN

## 2024-07-22 MED ORDER — CHLORHEXIDINE GLUCONATE 0.12 % MT SOLN
OROMUCOSAL | Status: AC
  Administered 2024-07-22 (×2): 15 mL via OROMUCOSAL
  Filled 2024-07-22: qty 15

## 2024-07-22 MED ORDER — METOCLOPRAMIDE HCL 5 MG PO TABS: 5.0000 mg | ORAL_TABLET | Freq: Three times a day (TID) | ORAL | Status: DC | PRN

## 2024-07-22 SURGICAL SUPPLY — 62 items
BAG COUNTER SPONGE SURGICOUNT (BAG) ×2 IMPLANT
BIT DRILL 4.3X300MM (BIT) IMPLANT
BIT DRILL LONG 3.3 (BIT) IMPLANT
BIT DRILL QC 3.3X195 (BIT) IMPLANT
BLADE CLIPPER SURG (BLADE) IMPLANT
BNDG COHESIVE 6X5 TAN ST LF (GAUZE/BANDAGES/DRESSINGS) ×2 IMPLANT
BNDG ELASTIC 2 VLCR STRL LF (GAUZE/BANDAGES/DRESSINGS) IMPLANT
BNDG ELASTIC 6X10 VLCR STRL LF (GAUZE/BANDAGES/DRESSINGS) ×2 IMPLANT
BRUSH SCRUB EZ PLAIN DRY (MISCELLANEOUS) ×4 IMPLANT
CANISTER SUCTION 3000ML PPV (SUCTIONS) ×2 IMPLANT
CAP LOCK NCB (Cap) IMPLANT
CHLORAPREP W/TINT 26 (MISCELLANEOUS) ×2 IMPLANT
COVER SURGICAL LIGHT HANDLE (MISCELLANEOUS) ×2 IMPLANT
DERMABOND ADVANCED .7 DNX12 (GAUZE/BANDAGES/DRESSINGS) IMPLANT
DRAPE C-ARM 42X72 X-RAY (DRAPES) ×2 IMPLANT
DRAPE C-ARMOR (DRAPES) ×2 IMPLANT
DRAPE HALF SHEET 40X57 (DRAPES) ×4 IMPLANT
DRAPE SURG 17X23 STRL (DRAPES) ×2 IMPLANT
DRAPE SURG ORHT 6 SPLT 77X108 (DRAPES) ×4 IMPLANT
DRAPE U-SHAPE 47X51 STRL (DRAPES) ×2 IMPLANT
DRESSING MEPILEX FLEX 4X4 (GAUZE/BANDAGES/DRESSINGS) IMPLANT
DRSG ADAPTIC 3X8 NADH LF (GAUZE/BANDAGES/DRESSINGS) IMPLANT
DRSG MEPILEX POST OP 4X12 (GAUZE/BANDAGES/DRESSINGS) IMPLANT
DRSG MEPILEX POST OP 4X8 (GAUZE/BANDAGES/DRESSINGS) IMPLANT
ELECTRODE REM PT RTRN 9FT ADLT (ELECTROSURGICAL) ×2 IMPLANT
GAUZE PAD ABD 8X10 STRL (GAUZE/BANDAGES/DRESSINGS) ×6 IMPLANT
GAUZE SPONGE 4X4 12PLY STRL (GAUZE/BANDAGES/DRESSINGS) ×2 IMPLANT
GLOVE BIO SURGEON STRL SZ 6.5 (GLOVE) ×6 IMPLANT
GLOVE BIO SURGEON STRL SZ7.5 (GLOVE) ×8 IMPLANT
GLOVE BIOGEL PI IND STRL 6.5 (GLOVE) ×2 IMPLANT
GLOVE BIOGEL PI IND STRL 7.5 (GLOVE) ×2 IMPLANT
GOWN STRL REUS W/ TWL LRG LVL3 (GOWN DISPOSABLE) ×6 IMPLANT
KIT BASIN OR (CUSTOM PROCEDURE TRAY) ×2 IMPLANT
KIT TURNOVER KIT B (KITS) ×2 IMPLANT
KWIRE FXSTD 280X2XNS SS (WIRE) IMPLANT
NS IRRIG 1000ML POUR BTL (IV SOLUTION) ×2 IMPLANT
PACK TOTAL JOINT (CUSTOM PROCEDURE TRAY) ×2 IMPLANT
PAD ARMBOARD POSITIONER FOAM (MISCELLANEOUS) ×2 IMPLANT
PAD CAST 4YDX4 CTTN HI CHSV (CAST SUPPLIES) ×2 IMPLANT
PADDING CAST ABS COTTON 6X4 NS (CAST SUPPLIES) IMPLANT
PADDING CAST COTTON 6X4 STRL (CAST SUPPLIES) ×2 IMPLANT
PLATE DISTAL FEMUR 15H 317M RT (Plate) IMPLANT
SCREW 5.0 70MM (Screw) IMPLANT
SCREW 5.0 80MM (Screw) IMPLANT
SCREW CORT NCB SELFTAP 5.0X50 (Screw) IMPLANT
SCREW NCB 3.5X75X5X6.2XST (Screw) IMPLANT
SCREW NCB 4.0MX34M (Screw) IMPLANT
SCREW NCB 4.0MX50M (Screw) IMPLANT
SCREW NCB 4.0X36MM (Screw) IMPLANT
SCREW NCB 5.0X36MM (Screw) IMPLANT
SPONGE T-LAP 18X18 ~~LOC~~+RFID (SPONGE) IMPLANT
STAPLER SKIN PROX 35W (STAPLE) ×2 IMPLANT
SUCTION TUBE FRAZIER 10FR DISP (SUCTIONS) ×2 IMPLANT
SUT ETHILON 3 0 PS 1 (SUTURE) ×4 IMPLANT
SUT MNCRL AB 3-0 PS2 27 (SUTURE) IMPLANT
SUT MON AB 2-0 CT1 36 (SUTURE) IMPLANT
SUT VIC AB 0 CT1 27XBRD ANBCTR (SUTURE) IMPLANT
SUT VIC AB 1 CT1 27XBRD ANBCTR (SUTURE) IMPLANT
SUT VIC AB 2-0 CT1 TAPERPNT 27 (SUTURE) ×4 IMPLANT
TOWEL GREEN STERILE (TOWEL DISPOSABLE) ×4 IMPLANT
TRAY FOLEY MTR SLVR 16FR STAT (SET/KITS/TRAYS/PACK) IMPLANT
WATER STERILE IRR 1000ML POUR (IV SOLUTION) ×4 IMPLANT

## 2024-07-22 NOTE — Interval H&P Note (Signed)
 History and Physical Interval Note:  07/22/2024 9:24 AM  Leah Holland  has presented today for surgery, with the diagnosis of RIGHT FEMUR FRACTURE.  The various methods of treatment have been discussed with the patient and family. After consideration of risks, benefits and other options for treatment, the patient has consented to  Procedure(s): OPEN REDUCTION INTERNAL FIXATION (ORIF) DISTAL FEMUR FRACTURE (Right) as a surgical intervention.  The patient's history has been reviewed, patient examined, no change in status, stable for surgery.  I have reviewed the patient's chart and labs.  Questions were answered to the patient's satisfaction.     Jahnai Slingerland P Raymont Andreoni

## 2024-07-22 NOTE — Consult Note (Signed)
 Orthopaedic Trauma Service (OTS) Consult   [name] MRN: [id number] DOB/AGE: Oct 20, 1922 89 y.o.  Reason for Consult:Right periprosthetic femur fracture Referring Physician: Dr. Velinda Chancy, MD [facility]  HPI: Leah Holland is an 89 y.o. female who is being seen in consultation at the request of Dr. Chancy for evaluation of right periprosthetic femur fracture.  Patient had a ground-level fall sustaining above injury.  She was found to have a periprosthetic femur fracture.  Due to complexity of the injury Dr. Chancy felt that it would best be managed by an orthopedic traumatologist.  Patient has a history of total knee replacement as well as right total hip replacement.  There was a positive culture on her total hip replacement and they have been treating that on the right side with suppressive antibiotics with infectious disease.  She is been on Bactrim  for years.  She tolerates this well.  She states that she ambulates independently with a walker but has been getting weaker over the last few years and so she has a scooter that she uses to get around.  Her sons are at bedside who provide part of the history.  Her 1 son is a retired Optometrist.  She denies any other injuries.  She is lucid and does not have any significant history of dementia.  She is on Eliquis  her last dose was Saturday which was the day prior to yesterday.  Past Medical History:  Diagnosis Date   Anxiety attack    take RX prn (08/13/2013)   Arthritis    probably (08/13/2013)   Atrial fibrillation (HCC)    new onset/pt report 08/13/2013   Bladder wall thickening 07/01/2021   CT scan in Epic   Bronchitis 12/2011   Cholelithiasis 07/01/2021   CT in Epic.   Common bile duct dilation 07/01/2021   CT in Epic:  13 mm.   Complication of anesthesia    Compression fracture of first lumbar vertebra (HCC) 07/01/2021   CT in Epic.   Depression    Femur fracture (HCC)    left proximal femur non-union    GERD (gastroesophageal reflux disease)    Grieving 05/03/2023   H/O hiatal hernia    Hardware complicating wound infection (HCC) 08/22/2016   History of blood transfusion    w/both knee OR's (08/13/2013)   Hyperlipidemia    Hyperlipidemia    Hypertension    Hypertensive urgency 10/22/2018   Hypothyroidism    takes synthroid    Insomnia    Intertrochanteric fracture of right hip (HCC) 07/30/2015   Neuromuscular disorder (HCC)    carpal tunnel in right hand   Periprosthetic fracture around internal prosthetic right hip joint (HCC), nonunion basicervical femoral neck with broken hardware and subtrochanteric fracture 03/16/2016   PONV (postoperative nausea and vomiting)    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 Court, cardiologist 1 x year [phone]   Tendon dysfunction 08/22/2016   Wears hearing aid    [facility]    Past Surgical History:  Procedure Laterality Date   2D ECHOCARDIOGRAM  12/07/2004   EF 48%, moderate pulmonary hypertension   BREAST BIOPSY Left ?1970's   CARDIOVASCULAR STRESS TEST  12/07/11   CARDIOVASCULAR  STRESS TEST  12/07/2011   LEXISCAN , normal scan, no significant wall abnormalities noted   CARPAL TUNNEL RELEASE Right ~ 2005   CATARACT EXTRACTION, BILATERAL Bilateral 2000   CYSTOSCOPY  11/02/2021   Procedure: CYSTOSCOPY;  Surgeon: Viktoria Comer SAUNDERS, MD;  Location: [facility];  Service: Gynecology;;   DILATION AND CURETTAGE OF UTERUS  1950's   DOPPLER ECHOCARDIOGRAPHY  12/07/11   EYE SURGERY     FEMUR IM NAIL Right 08/01/2015   Procedure: INTRAMEDULLARY (IM) NAIL FEMORAL;  Surgeon: Fonda Olmsted, MD;  Location: [facility] OR;  Service: Orthopedics;  Laterality: Right;   HARDWARE REMOVAL Right 03/16/2016   Procedure:  HARDWARE REMOVAL;  Surgeon: Fonda Olmsted, MD;  Location: [facility] OR;  Service: Orthopedics;  Laterality: Right;   INTRAMEDULLARY (IM) NAIL INTERTROCHANTERIC Left 01/26/2017   Procedure: INTRAMEDULLARY (IM) NAIL INTERTROCHANTRIC;  Surgeon: Kay CHRISTELLA Cummins, MD;  Location: [facility] OR;  Service: Orthopedics;  Laterality: Left;   JOINT REPLACEMENT     bilateral knee replacement   ROBOTIC ASSISTED BILATERAL SALPINGO OOPHERECTOMY Bilateral 11/02/2021   Procedure: XI ROBOTIC ASSISTED BILATERAL SALPINGO OOPHORECTOMY;  Surgeon: Viktoria Comer SAUNDERS, MD;  Location: [facility];  Service: Gynecology;  Laterality: Bilateral;   ROBOTIC ASSISTED TOTAL HYSTERECTOMY N/A 11/02/2021   Procedure: XI ROBOTIC ASSISTED TOTAL HYSTERECTOMY WITH  MINI LAPAROTOMY;  Surgeon: Viktoria Comer SAUNDERS, MD;  Location: [facility];  Service: Gynecology;  Laterality: N/A;   SKIN CANCER EXCISION  2000's   off my nose (08/13/2013)   TOTAL HIP ARTHROPLASTY Right 03/16/2016   Procedure: TOTAL HIP ARTHROPLASTY;  Surgeon: Fonda Olmsted, MD;  Location: [facility] OR;  Service: Orthopedics;  Laterality: Right;   TOTAL HIP ARTHROPLASTY Left 06/09/2017   Procedure: REMOVE LEFT HIP INTRAMEDULLARY NAIL AND CONVERT TO LEFT CEMENTED HEMI HIP ARTHROPLASTY;  Surgeon: Cummins Kay CHRISTELLA, MD;  Location: [facility] OR;  Service: Orthopedics;  Laterality: Left;   TOTAL KNEE ARTHROPLASTY Right 2005   TOTAL KNEE ARTHROPLASTY  03/02/2012   Procedure: TOTAL KNEE ARTHROPLASTY;  Surgeon: LELON JONETTA Shari Mickey., MD;  Location: [facility] OR;  Service: Orthopedics;  Laterality: Left;    Family History  Problem Relation Age of Onset   Arrhythmia Mother    Heart disease Mother    Stroke Father 43   Arrhythmia Sister    Heart disease Sister    Heart disease Sister    Stroke Sister    Diabetes Sister    Heart disease Brother    Heart disease Brother    Cancer Brother        Colon cancer   Arrhythmia Brother    Heart disease Brother    Heart disease Child 46   Heart disease Child 66   Breast cancer Neg Hx     Ovarian cancer Neg Hx    Endometrial cancer Neg Hx    Pancreatic cancer Neg Hx    Prostate cancer Neg Hx     Social History:  reports that she has never smoked. She has never used smokeless tobacco. She reports that she does not drink alcohol and does not use drugs.  Allergies:  Allergies  Allergen Reactions   Levaquin [Levofloxacin] Other (See Comments)    tendinopathy   Morphine  And Codeine Other (See Comments)    Made pt wild   Morphine  Sulfate     Other reaction(s): made pt wild    Medications:  No current facility-administered medications on file prior to encounter.   Current Outpatient Medications on File Prior to Encounter  Medication Sig Dispense Refill  acetaminophen  (TYLENOL ) 325 MG tablet Take 650 mg by mouth as needed for moderate pain (pain score 4-6) or mild pain (pain score 1-3).     Apoaequorin (PREVAGEN EXTRA STRENGTH) 20 MG CAPS Take 1 tablet by mouth every evening.     chlorthalidone  (HYGROTON ) 25 MG tablet TAKE 1 tablet in the morning with food Orally ONCE DAY (Patient taking differently: Take 25 mg by mouth in the morning.) 90 tablet 1   Cholecalciferol  2000 units CAPS Take 2,000 Units by mouth every evening.     diltiazem  (CARTIA  XT) 180 MG 24 hr capsule TAKE ONE CAPSULE BY MOUTH DAILY (Patient taking differently: Take 180 mg by mouth in the morning.) 90 capsule 3   ELIQUIS  2.5 MG TABS tablet TAKE ONE TABLET BY MOUTH TWICE DAILY. 180 tablet 1   esomeprazole  (NEXIUM ) 20 MG capsule Take 20 mg by mouth as needed.     Flaxseed, Linseed, (FLAXSEED OIL) 1000 MG CAPS Take 1,000 mg by mouth 2 (two) times daily.      furosemide  (LASIX ) 20 MG tablet Take 20 mg by mouth as needed for edema.     levothyroxine  (SYNTHROID ) 112 MCG tablet Take 112 mcg by mouth daily before breakfast.      metoprolol  tartrate (LOPRESSOR ) 50 MG tablet take 1 tablet with food Orally Twice a day 90 days (Patient taking differently: Take 50 mg by mouth 2 (two) times daily.) 180 tablet 3    Multiple Vitamins-Minerals (PRESERVISION AREDS 2+MULTI VIT PO) Take 1 capsule by mouth in the morning and at bedtime.     potassium chloride  (KLOR-CON  M) 10 MEQ tablet Take 10 mEq by mouth as needed. Take 1 tablet by mouth when taking 1 of the 20 mg of Lasix .     senna (SENOKOT) 8.6 MG tablet Take 2 tablets by mouth at bedtime.     sulfamethoxazole -trimethoprim  (BACTRIM ) 400-80 MG tablet Take 1 tablet by mouth 2 (two) times daily. 60 tablet 11   temazepam  (RESTORIL ) 30 MG capsule Take 1 capsule (30 mg total) by mouth at bedtime as needed for sleep. (Patient taking differently: Take 30 mg by mouth at bedtime.) 30 capsule 0     ROS: Constitutional: No fever or chills Vision: No changes in vision ENT: No difficulty swallowing CV: No chest pain Pulm: No SOB or wheezing GI: No nausea or vomiting GU: No urgency or inability to hold urine Skin: No poor wound healing Neurologic: No numbness or tingling Psychiatric: No depression or anxiety Heme: No bruising Allergic: No reaction to medications or food   Exam: Blood pressure (!) 164/70, pulse 85, temperature 98.2 F (36.8 C), resp. rate 16, height 5' 3 (1.6 m), weight 59 kg, SpO2 91%. General: No acute distress Orientation: Awake alert and oriented x 3 Mood and Affect: Cooperative and pleasant Gait: Unable to assess due to her fracture Coordination and balance: Within normal limits  Right lower extremity: Well-healed incisions from total hip and total knee arthroplasty.  Moderate swelling to the thigh with deformity through it.  Compartments are soft and compressible.  Buck's traction is in place.  She is able to wiggle her toes and endorses sensation to the dorsum and plantar aspect of her foot.  She has 2+ DP pulses.  Left lower extremity: Skin without lesions. No tenderness to palpation. Full painless ROM, full strength in each muscle groups without evidence of instability.   Medical Decision Making: Data: Imaging: X-rays of her right  femur reviewed which shows a periprosthetic mid to distal metaphyseal  fracture with significant angulation.  No involvement of the total hip or total knee arthroplasty.  Labs:  Results for orders placed or performed during the hospital encounter of 07/21/24 (from the past 24 hours)  Troponin I (High Sensitivity)     Status: None   Collection Time: 07/21/24 10:44 AM  Result Value Ref Range   Troponin I (High Sensitivity) 11 <18 ng/L  Basic metabolic panel     Status: Abnormal   Collection Time: 07/22/24  7:36 AM  Result Value Ref Range   Sodium 137 135 - 145 mmol/L   Potassium 3.3 (L) 3.5 - 5.1 mmol/L   Chloride 100 98 - 111 mmol/L   CO2 27 22 - 32 mmol/L   Glucose, Bld 128 (H) 70 - 99 mg/dL   BUN 10 8 - 23 mg/dL   Creatinine, Ser 9.16 0.44 - 1.00 mg/dL   Calcium  9.7 8.9 - 10.3 mg/dL   GFR, Estimated >39 >39 mL/min   Anion gap 10 5 - 15     Imaging or Labs ordered: None  Medical history and chart was reviewed and case discussed with medical provider.  Assessment/Plan: 89 year old female with a right periprosthetic femur fracture.  Due to the unstable nature of her injury I recommend proceeding with open reduction total fixation.  Risks and benefits were discussed with the patient and her sons.  Risks include but not limited to bleeding, infection, malunion, nonunion, hardware failure, hardware irritation, nerve and blood vessel injury, DVT, knee stiffness, even possibility anesthetic complications.  They agreed to proceed with surgery and consent was obtained.  Franky MYRTIS Light, MD [facility] [phone] (office) [email or web address]

## 2024-07-22 NOTE — Plan of Care (Signed)

## 2024-07-22 NOTE — Op Note (Signed)
 Orthopaedic Surgery Operative Note (CSN: [id number] ) Date of Surgery: 07/22/2024  Admit Date: 07/21/2024   Diagnoses: Pre-Op Diagnoses: Right periprosthetic distal femur fracture  Post-Op Diagnosis: Same  Procedures: CPT 27511-Open reduction internal fixation of right distal femur fracture  Surgeons : Primary: Kendal Franky SQUIBB, MD  Assistant: Lauraine Moores, PA-C  Location: OR 3   Anesthesia: General   Antibiotics: Ancef  2g preop with 1 gm vancomycin  powder placed topically   Tourniquet time: None    Estimated Blood Loss: 50 mL  Complications:* No complications entered in OR log *   Specimens:* No specimens in log *   Implants: Implant Name Type Inv. Item Serial No. Manufacturer Lot No. LRB No. Used Action  CAP LOCK NCB - [id number] Cap CAP LOCK NCB  [employer](ORTH,TRAU,BIO,SG)  Right 8 Implanted  PLATE DISTAL FEMUR 15H 317M RT - [id number] Plate PLATE DISTAL FEMUR 15H 317M RT  [employer](ORTH,TRAU,BIO,SG)  Right 1 Implanted  SCREW NCB 5.0X36MM - [id number] Screw SCREW NCB 5.0X36MM  [employer](ORTH,TRAU,BIO,SG)  Right 2 Implanted  SCREW 5.0 - [id number] Screw SCREW 5.0  [employer](ORTH,TRAU,BIO,SG)  Right 1 Implanted  SCREW 5.0 - [id number] Screw SCREW 5.0  [employer](ORTH,TRAU,BIO,SG)  Right 3 Implanted  SCREW NCB 6.4K24K4K3.2XST - [id number] Screw SCREW NCB [id number]  [employer](ORTH,TRAU,BIO,SG)  Right 1 Implanted  SCREW NCB 4.0MX50M - [id number] Screw SCREW NCB 4.0MX50M  [employer](ORTH,TRAU,BIO,SG)  Right 1 Implanted  SCREW NCB 4.0X36MM - [id number] Screw SCREW NCB 4.0X36MM  [employer](ORTH,TRAU,BIO,SG)  Right 1 Implanted  SCREW NCB 4.0MX34M - [id number] Screw SCREW NCB 4.0MX34M  [employer](ORTH,TRAU,BIO,SG)  Right 1 Implanted     Indications for Surgery: 89 year old female who sustained a ground-level fall with a right periprosthetic distal femur fracture.  Due to the unstable nature of her injury I recommend proceeding  with open reduction internal fixation.  Risks and benefits were discussed with the patient and her sons.  Risks included but not limited to bleeding, infection, malunion, nonunion, hardware failure, hardware rotation, nerve and blood vessel injury, DVT, knee stiffness, even possible anesthetic complications.  They agreed to proceed with surgery and consent was obtained.  Operative Findings: Open reduction internal fixation of right periprosthetic distal femur fracture using [employer] NCB distal femoral locking plate.  Procedure: The patient was identified in the preoperative holding area. Consent was confirmed with the patient and their family and all questions were answered. The operative extremity was marked after confirmation with the patient. she was then brought back to the operating room by our anesthesia colleagues.  She was placed under general anesthetic and carefully transferred over to radiolucent flattop table.  A bump was placed into her operative hip.  The right lower extremity was then prepped and draped in usual sterile fashion.  Timeout was performed to verify the patient, the procedure, and the extremity.  Preoperative antibiotics were dosed.  The hip and knee were flexed over a triangle and fluoroscopic imaging showed the unstable nature of her injury.  A lateral approach to the distal femur was made and carried down through skin and subcutaneous tissue.  I incised through the IT band and expose the lateral condyle of the femur.  Traction was applied to the lower extremity and alignment was maintained on AP and lateral views of the fracture.  I then chose a [employer] 15 hole NCB distal femoral locking plate and slid this submuscularly along the lateral cortex of the femur attached to the targeting arm.  A  K wire was placed distally to align the distal portion of the plate and a percutaneous 3.3 mm drill bit was used to align the proximal portion of the plate just distal to the  femoral prosthesis.  Once I had the alignment and position of the plate I then drilled and placed 5.0 millimeter screws distally to bring the plate flush to bone.  I placed 1 in the metaphysis to help reduce the plate to the metaphysis and correct the coronal alignment.  Placed 2 more in the distal block around the prosthesis.  I then proceeded to place 5.0 millimeter screws in the femoral shaft.  I replaced my 3.3 mm drill bit with a 4.0 millimeter screw.  I then was able to place a bicortical screw around the femoral prosthesis proximally to complete the proximal construct.  Locking caps were placed on the 3 proximal screws I then removed the targeting arm and proceeded to place a total of 6 screws distally.  Locking caps were placed in the 5 distal screws.  Final fluoroscopic imaging was obtained.  The incisions were copiously irrigated.  A gram of vancomycin  powder was placed into the incision.  A layered closure of 0 Vicryl, 2-0 Monocryl and 3-0 Monocryl with Dermabond was used to close the skin.  Sterile dressing was applied.  The patient was then awoke from anesthesia and taken to the PACU in stable condition.    Post Op Plan/Instructions: The patient will be weightbearing as tolerated to the right lower extremity.  She will receive postoperative Ancef .  She will be restarted on her Eliquis  postoperative day 1.  Will have her mobilize with physical and Occupational Therapy.  I was present and performed the entire surgery.  Lauraine Moores, PA-C did assist me throughout the case. An assistant was necessary given the difficulty in approach, maintenance of reduction and ability to instrument the fracture.   Franky Light, MD [facility]

## 2024-07-22 NOTE — Anesthesia Postprocedure Evaluation (Signed)
 Anesthesia Post Note  Patient: Leah Holland  Procedure(s) Performed: OPEN REDUCTION INTERNAL FIXATION (ORIF) DISTAL FEMUR FRACTURE (Right)     Patient location during evaluation: PACU Anesthesia Type: General Level of consciousness: awake and alert Pain management: pain level controlled Vital Signs Assessment: post-procedure vital signs reviewed and stable Respiratory status: spontaneous breathing, nonlabored ventilation, respiratory function stable and patient connected to nasal cannula oxygen Cardiovascular status: blood pressure returned to baseline and stable Postop Assessment: no apparent nausea or vomiting Anesthetic complications: no   There were no known notable events for this encounter.  Last Vitals:  Vitals:   07/22/24 1230 07/22/24 1245  BP: (!) 163/77 (!) 161/57  Pulse: (!) 102 (!) 101  Resp: 17 20  Temp:  (!) 36.4 C  SpO2: 94% 95%    Last Pain:  Vitals:   07/22/24 1230  TempSrc:   PainSc: 0-No pain                 Franky JONETTA Bald

## 2024-07-22 NOTE — Progress Notes (Signed)
 PROGRESS NOTE    Leah Holland  FMW:[id number] DOB: Jul 28, 1922 DOA: 07/21/2024 PCP: [name], MD (Inactive)    Brief Narrative:  Leah Holland is a 89 y.o. 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 Appears to be a mechanical fall.  [facility] is [facility].  Assessment and Plan:  RLE distal femur fracture 2/2 GLF -Orthopedic surgery consulted-plan for surgery on 8/11 -Multimodal pain control w/ Tylenol  1g TID, gabapentin  300mg  TID, and oxycodone  5mg  q4h prn -Appears to be mechanical fall versus syncope-EEG within normal limits, echo pending -PT/OT after surgery  Atrial fibrillation - On Eliquis  which has been held - Continue rate controlling medications   Hypothyroidism - Continue Synthroid   Hypokalemia -replete  history of total knee replacement as well as right total hip replacement. - positive culture on her total hip replacement and they have been treating that on the right side with suppressive antibiotics with infectious disease. She is been on Bactrim  for years -- will resume  DVT prophylaxis: SCDs Start: 07/21/24 1341    Code Status: Full Code Family Communication: Son at bedside  Disposition Plan:  Level of care: Med-Surg Status is: Inpatient     Consultants:  Orthopedics  Subjective: Sleeping   Objective: Vitals:   07/22/24 0016 07/22/24 0410 07/22/24 0809 07/22/24 0923  BP: (!) 151/72 (!) 160/90 (!) 164/70 (!) 152/75  Pulse: 79 81 85 (!) 105  Resp: 16 18 16 18   Temp: 98.6 F (37 C) 98.5 F (36.9 C) 98.2 F (36.8 C) 97.9 F (36.6 C)  TempSrc: Oral Oral  Oral  SpO2: 93% 93% 91% 93%  Weight:    58.9 kg  Height:    5' 3 (1.6 m)    Intake/Output Summary (Last 24 hours) at  07/22/2024 1023 Last data filed at 07/22/2024 0900 Gross per 24 hour  Intake 0 ml  Output 1300 ml  Net -1300 ml   Filed Weights   07/21/24 0840 07/22/24 0923  Weight: 59 kg 58.9 kg    Examination:  In bed, appears comfortable    Data Reviewed: I have personally reviewed following labs and imaging studies  CBC: Recent Labs  Lab 07/21/24 0849  WBC 9.1  NEUTROABS 6.5  HGB 16.0*  HCT 48.6*  MCV 95.9  PLT 265   Basic Metabolic Panel: Recent Labs  Lab 07/21/24 0849 07/22/24 0736  NA 138 137  K 3.5 3.3*  CL 99 100  CO2 27 27  GLUCOSE 150* 128*  BUN 20 10  CREATININE 1.20* 0.83  CALCIUM  10.9* 9.7   GFR: Estimated Creatinine Clearance: 30.6 mL/min (by C-G formula based on SCr of 0.83 mg/dL). Liver Function Tests: Recent Labs  Lab 07/21/24 0849  AST 22  ALT 20  ALKPHOS 105  BILITOT 0.9  PROT 7.2  ALBUMIN  3.9   No results for input(s): LIPASE, AMYLASE in the last 168 hours. No results for input(s): AMMONIA in the last 168 hours. Coagulation Profile: No results for input(s): INR, PROTIME in the last 168 hours. Cardiac Enzymes: No results for input(s): CKTOTAL, CKMB, CKMBINDEX, TROPONINI in the last 168 hours. BNP (last 3 results) No results for input(s): PROBNP in the last 8760 hours. HbA1C: No results for input(s): HGBA1C in the last 72 hours. CBG: No results for input(s): GLUCAP  in the last 168 hours. Lipid Profile: No results for input(s): CHOL, HDL, LDLCALC, TRIG, CHOLHDL, LDLDIRECT in the last 72 hours. Thyroid  Function Tests: No results for input(s): TSH, T4TOTAL, FREET4, T3FREE, THYROIDAB in the last 72 hours. Anemia Panel: No results for input(s): VITAMINB12, FOLATE, FERRITIN, TIBC, IRON, RETICCTPCT in the last 72 hours. Sepsis Labs: No results for input(s): PROCALCITON, LATICACIDVEN in the last 168 hours.  No results found for this or any previous visit (from the past 240 hours).        Radiology Studies: EEG adult Result Date: 07/21/2024 Shelton Arlin KIDD, MD     07/21/2024  4:39 PM Patient Name: Leah Holland MRN: [id number] Epilepsy Attending: Arlin KIDD Shelton Referring Physician/Provider: Georgina Basket, MD Date: 07/21/2024 Duration: 22.27 mins Patient history: 89yo F with syncope. EEG to evaluate for seizure Level of alertness: Awake AEDs during EEG study: GBP Technical aspects: This EEG study was done with scalp electrodes positioned according to the 10-20 International system of electrode placement. Electrical activity was reviewed with band pass filter of 1-70Hz , sensitivity of 7 uV/mm, display speed of 51mm/sec with a 60Hz  notched filter applied as appropriate. EEG data were recorded continuously and digitally stored.  Video monitoring was available and reviewed as appropriate. Description: The posterior dominant rhythm consists of 8-9 Hz activity of moderate voltage (25-35 uV) seen predominantly in posterior head regions, symmetric and reactive to eye opening and eye closing. Hyperventilation and photic stimulation were not performed.   IMPRESSION: This study is within normal limits. No seizures or epileptiform discharges were seen throughout the recording. A normal interictal EEG does not exclude the diagnosis of epilepsy. Arlin KIDD Shelton   DG Chest 1 View Result Date: 07/21/2024 EXAM: 1 VIEW XRAY OF THE CHEST 07/21/2024 09:34:00 AM COMPARISON: 07/25/2017 CLINICAL HISTORY: Fall. Fell off toilet and now has right upper leg pain. FINDINGS: LUNGS AND PLEURA: No focal pulmonary opacity. No pulmonary edema. No pleural effusion. No pneumothorax. HEART AND MEDIASTINUM: Moderate cardiomegaly. Atherosclerosis in the transverse aorta. BONES AND SOFT TISSUES: No acute osseous abnormality. Skin fold over the superolateral right hemithorax. IMPRESSION: 1. No acute process. 2. Moderate cardiomegaly and atherosclerosis in the transverse aorta. Electronically signed by: Rockey Kilts MD  07/21/2024 09:58 AM EDT RP Workstation: [id number]   DG Femur Min 2 Views Right Result Date: 07/21/2024 EXAM: 2 VIEW(S) XRAY OF THE RIGHT FEMUR 07/21/2024 09:34:00 AM COMPARISON: CT Head dated 01/25/2016. CLINICAL HISTORY: Fall. Fell off toilet and now has right upper leg pain. FINDINGS: BONES AND JOINTS: Right hip hemiarthroplasty. Total knee arthroplasty. Osteopenia. Oblique fracture of the mid to distal femoral shaft with nearly 1 shaft width of medial displacement of distal fracture fragment. Extensive overlap and angulation on the lateral view, apex anterior. No intraarticular extension. SOFT TISSUES: Vascular calcifications. IMPRESSION: 1. Oblique fracture of the mid to distal femoral shaft with nearly 1 shaft width of medial displacement of the distal fracture fragment, extensive overlap, and apex anterior angulation on the lateral view. No intraarticular extension. 2. Right hip hemiarthroplasty and total knee arthroplasty. 3. Osteopenia. Electronically signed by: Rockey Kilts MD 07/21/2024 09:56 AM EDT RP Workstation: [id number]        Scheduled Meds:  [FJM Hold] chlorthalidone   25 mg Oral Daily   [MAR Hold] diltiazem   180 mg Oral Daily   [MAR Hold] gabapentin   300 mg Oral TID   [MAR Hold] levothyroxine   112 mcg Oral QAC breakfast   Continuous Infusions:  sodium chloride  100 mL/hr at 07/22/24 0300  ceFAZolin  (ANCEF ) IV     lactated ringers  10 mL/hr at 07/22/24 0955     LOS: 1 day    Time spent: 45 minutes spent on chart review, discussion with nursing staff, consultants, updating family and interview/physical exam; more than 50% of that time was spent in counseling and/or coordination of care.    Harlene RAYMOND Bowl, DO [facility] Available via Epic secure chat 7am-7pm After these hours, please refer to coverage provider listed on [email or web address] 07/22/2024, 10:23 AM

## 2024-07-22 NOTE — Anesthesia Preprocedure Evaluation (Addendum)
 Anesthesia Evaluation  Patient identified by MRN, date of birth, ID band Patient awake    Reviewed: Allergy & Precautions, NPO status , Patient's Chart, lab work & pertinent test results  History of Anesthesia Complications (+) PONV and history of anesthetic complications  Airway Mallampati: I  TM Distance: >3 FB Neck ROM: Full    Dental  (+) Edentulous Upper, Edentulous Lower   Pulmonary neg pulmonary ROS   breath sounds clear to auscultation       Cardiovascular hypertension, Pt. on home beta blockers and Pt. on medications + dysrhythmias Atrial Fibrillation  Rhythm:Regular Rate:Normal     Neuro/Psych  PSYCHIATRIC DISORDERS Anxiety Depression Bipolar Disorder    Neuromuscular disease    GI/Hepatic Neg liver ROS, hiatal hernia,GERD  Medicated,,  Endo/Other  diabetesHypothyroidism    Renal/GU negative Renal ROS     Musculoskeletal   Abdominal   Peds  Hematology   Anesthesia Other Findings   Reproductive/Obstetrics                              Anesthesia Physical Anesthesia Plan  ASA: 3  Anesthesia Plan: General   Post-op Pain Management: Tylenol  PO (pre-op)*   Induction: Intravenous  PONV Risk Score and Plan: TIVA, Ondansetron , Propofol  infusion and Treatment may vary due to age or medical condition  Airway Management Planned: Oral ETT  Additional Equipment: None  Intra-op Plan:   Post-operative Plan: Extubation in OR  Informed Consent: I have reviewed the patients History and Physical, chart, labs and discussed the procedure including the risks, benefits and alternatives for the proposed anesthesia with the patient or authorized representative who has indicated his/her understanding and acceptance.       Plan Discussed with: CRNA  Anesthesia Plan Comments:          Anesthesia Quick Evaluation

## 2024-07-22 NOTE — H&P (View-Only) (Signed)
 Orthopaedic Trauma Service (OTS) Consult   [name] MRN: [id number] DOB/AGE: [birth year] 89 y.o.  Reason for Consult:Right periprosthetic femur fracture Referring Physician: Dr. Velinda Chancy, MD [facility]  HPI: Leah Holland is an 89 y.o. female who is being seen in consultation at the request of Dr. Chancy for evaluation of right periprosthetic femur fracture.  Patient had a ground-level fall sustaining above injury.  She was found to have a periprosthetic femur fracture.  Due to complexity of the injury Dr. Chancy felt that it would best be managed by an orthopedic traumatologist.  Patient has a history of total knee replacement as well as right total hip replacement.  There was a positive culture on her total hip replacement and they have been treating that on the right side with suppressive antibiotics with infectious disease.  She is been on Bactrim  for years.  She tolerates this well.  She states that she ambulates independently with a walker but has been getting weaker over the last few years and so she has a scooter that she uses to get around.  Her sons are at bedside who provide part of the history.  Her 1 son is a retired Optometrist.  She denies any other injuries.  She is lucid and does not have any significant history of dementia.  She is on Eliquis  her last dose was Saturday which was the day prior to yesterday.  Past Medical History:  Diagnosis Date   Anxiety attack    take RX prn (08/13/2013)   Arthritis    probably (08/13/2013)   Atrial fibrillation (HCC)    new onset/pt report 08/13/2013   Bladder wall thickening 07/01/2021   CT scan in Epic   Bronchitis 12/2011   Cholelithiasis 07/01/2021   CT in Epic.   Common bile duct dilation 07/01/2021   CT in Epic:  13 mm.   Complication of anesthesia    Compression fracture of first lumbar vertebra (HCC) 07/01/2021   CT in Epic.   Depression    Femur fracture (HCC)    left proximal femur non-union    GERD (gastroesophageal reflux disease)    Grieving 05/03/2023   H/O hiatal hernia    Hardware complicating wound infection (HCC) 08/22/2016   History of blood transfusion    w/both knee OR's (08/13/2013)   Hyperlipidemia    Hyperlipidemia    Hypertension    Hypertensive urgency 10/22/2018   Hypothyroidism    takes synthroid    Insomnia    Intertrochanteric fracture of right hip (HCC) 07/30/2015   Neuromuscular disorder (HCC)    carpal tunnel in right hand   Periprosthetic fracture around internal prosthetic right hip joint (HCC), nonunion basicervical femoral neck with broken hardware and subtrochanteric fracture 03/16/2016   PONV (postoperative nausea and vomiting)    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 Court, cardiologist 1 x year [phone]   Tendon dysfunction 08/22/2016   Wears hearing aid    [facility]    Past Surgical History:  Procedure Laterality Date   2D ECHOCARDIOGRAM  12/07/2004   EF 48%, moderate pulmonary hypertension   BREAST BIOPSY Left ?1970's   CARDIOVASCULAR STRESS TEST  12/07/11   CARDIOVASCULAR  STRESS TEST  12/07/2011   LEXISCAN , normal scan, no significant wall abnormalities noted   CARPAL TUNNEL RELEASE Right ~ 2005   CATARACT EXTRACTION, BILATERAL Bilateral 2000   CYSTOSCOPY  11/02/2021   Procedure: CYSTOSCOPY;  Surgeon: Viktoria Comer SAUNDERS, MD;  Location: [facility];  Service: Gynecology;;   DILATION AND CURETTAGE OF UTERUS  1950's   DOPPLER ECHOCARDIOGRAPHY  12/07/11   EYE SURGERY     FEMUR IM NAIL Right 08/01/2015   Procedure: INTRAMEDULLARY (IM) NAIL FEMORAL;  Surgeon: Fonda Olmsted, MD;  Location: [facility] OR;  Service: Orthopedics;  Laterality: Right;   HARDWARE REMOVAL Right 03/16/2016   Procedure:  HARDWARE REMOVAL;  Surgeon: Fonda Olmsted, MD;  [facility]: [facility] OR;  Service: Orthopedics;  Laterality: Right;   INTRAMEDULLARY (IM) NAIL INTERTROCHANTERIC Left 01/26/2017   Procedure: INTRAMEDULLARY (IM) NAIL INTERTROCHANTRIC;  Surgeon: Kay CHRISTELLA Cummins, MD;  Location: [facility] OR;  Service: Orthopedics;  Laterality: Left;   JOINT REPLACEMENT     bilateral knee replacement   ROBOTIC ASSISTED BILATERAL SALPINGO OOPHERECTOMY Bilateral 11/02/2021   Procedure: XI ROBOTIC ASSISTED BILATERAL SALPINGO OOPHORECTOMY;  Surgeon: Viktoria Comer SAUNDERS, MD;  Location: [facility];  Service: Gynecology;  Laterality: Bilateral;   ROBOTIC ASSISTED TOTAL HYSTERECTOMY N/A 11/02/2021   Procedure: XI ROBOTIC ASSISTED TOTAL HYSTERECTOMY WITH  MINI LAPAROTOMY;  Surgeon: Viktoria Comer SAUNDERS, MD;  Location: [facility];  Service: Gynecology;  Laterality: N/A;   SKIN CANCER EXCISION  2000's   off my nose (08/13/2013)   TOTAL HIP ARTHROPLASTY Right 03/16/2016   Procedure: TOTAL HIP ARTHROPLASTY;  Surgeon: Fonda Olmsted, MD;  Location: [facility] OR;  Service: Orthopedics;  Laterality: Right;   TOTAL HIP ARTHROPLASTY Left 06/09/2017   Procedure: REMOVE LEFT HIP INTRAMEDULLARY NAIL AND CONVERT TO LEFT CEMENTED HEMI HIP ARTHROPLASTY;  Surgeon: Cummins Kay CHRISTELLA, MD;  Location: [facility] OR;  Service: Orthopedics;  Laterality: Left;   TOTAL KNEE ARTHROPLASTY Right 2005   TOTAL KNEE ARTHROPLASTY  03/02/2012   Procedure: TOTAL KNEE ARTHROPLASTY;  Surgeon: LELON JONETTA Shari Mickey., MD;  Location: [facility] OR;  Service: Orthopedics;  Laterality: Left;    Family History  Problem Relation Age of Onset   Arrhythmia Mother    Heart disease Mother    Stroke Father 20   Arrhythmia Sister    Heart disease Sister    Heart disease Sister    Stroke Sister    Diabetes Sister    Heart disease Brother    Heart disease Brother    Cancer Brother        Colon cancer   Arrhythmia Brother    Heart disease Brother    Heart disease Child 46   Heart disease Child 11   Breast cancer Neg Hx     Ovarian cancer Neg Hx    Endometrial cancer Neg Hx    Pancreatic cancer Neg Hx    Prostate cancer Neg Hx     Social History:  reports that she has never smoked. She has never used smokeless tobacco. She reports that she does not drink alcohol and does not use drugs.  Allergies:  Allergies  Allergen Reactions   Levaquin [Levofloxacin] Other (See Comments)    tendinopathy   Morphine  And Codeine Other (See Comments)    Made pt wild   Morphine  Sulfate     Other reaction(s): made pt wild    Medications:  No current facility-administered medications on file prior to encounter.   Current Outpatient Medications on File Prior to Encounter  Medication Sig Dispense Refill  acetaminophen  (TYLENOL ) 325 MG tablet Take 650 mg by mouth as needed for moderate pain (pain score 4-6) or mild pain (pain score 1-3).     Apoaequorin (PREVAGEN EXTRA STRENGTH) 20 MG CAPS Take 1 tablet by mouth every evening.     chlorthalidone  (HYGROTON ) 25 MG tablet TAKE 1 tablet in the morning with food Orally ONCE DAY (Patient taking differently: Take 25 mg by mouth in the morning.) 90 tablet 1   Cholecalciferol  2000 units CAPS Take 2,000 Units by mouth every evening.     diltiazem  (CARTIA  XT) 180 MG 24 hr capsule TAKE ONE CAPSULE BY MOUTH DAILY (Patient taking differently: Take 180 mg by mouth in the morning.) 90 capsule 3   ELIQUIS  2.5 MG TABS tablet TAKE ONE TABLET BY MOUTH TWICE DAILY. 180 tablet 1   esomeprazole  (NEXIUM ) 20 MG capsule Take 20 mg by mouth as needed.     Flaxseed, Linseed, (FLAXSEED OIL) 1000 MG CAPS Take 1,000 mg by mouth 2 (two) times daily.      furosemide  (LASIX ) 20 MG tablet Take 20 mg by mouth as needed for edema.     levothyroxine  (SYNTHROID ) 112 MCG tablet Take 112 mcg by mouth daily before breakfast.      metoprolol  tartrate (LOPRESSOR ) 50 MG tablet take 1 tablet with food Orally Twice a day 90 days (Patient taking differently: Take 50 mg by mouth 2 (two) times daily.) 180 tablet 3    Multiple Vitamins-Minerals (PRESERVISION AREDS 2+MULTI VIT PO) Take 1 capsule by mouth in the morning and at bedtime.     potassium chloride  (KLOR-CON  M) 10 MEQ tablet Take 10 mEq by mouth as needed. Take 1 tablet by mouth when taking 1 of the 20 mg of Lasix .     senna (SENOKOT) 8.6 MG tablet Take 2 tablets by mouth at bedtime.     sulfamethoxazole -trimethoprim  (BACTRIM ) 400-80 MG tablet Take 1 tablet by mouth 2 (two) times daily. 60 tablet 11   temazepam  (RESTORIL ) 30 MG capsule Take 1 capsule (30 mg total) by mouth at bedtime as needed for sleep. (Patient taking differently: Take 30 mg by mouth at bedtime.) 30 capsule 0     ROS: Constitutional: No fever or chills Vision: No changes in vision ENT: No difficulty swallowing CV: No chest pain Pulm: No SOB or wheezing GI: No nausea or vomiting GU: No urgency or inability to hold urine Skin: No poor wound healing Neurologic: No numbness or tingling Psychiatric: No depression or anxiety Heme: No bruising Allergic: No reaction to medications or food   Exam: Blood pressure (!) 164/70, pulse 85, temperature 98.2 F (36.8 C), resp. rate 16, height 5' 3 (1.6 m), weight 59 kg, SpO2 91%. General: No acute distress Orientation: Awake alert and oriented x 3 Mood and Affect: Cooperative and pleasant Gait: Unable to assess due to her fracture Coordination and balance: Within normal limits  Right lower extremity: Well-healed incisions from total hip and total knee arthroplasty.  Moderate swelling to the thigh with deformity through it.  Compartments are soft and compressible.  Buck's traction is in place.  She is able to wiggle her toes and endorses sensation to the dorsum and plantar aspect of her foot.  She has 2+ DP pulses.  Left lower extremity: Skin without lesions. No tenderness to palpation. Full painless ROM, full strength in each muscle groups without evidence of instability.   Medical Decision Making: Data: Imaging: X-rays of her right  femur reviewed which shows a periprosthetic mid to distal metaphyseal  fracture with significant angulation.  No involvement of the total hip or total knee arthroplasty.  Labs:  Results for orders placed or performed during the hospital encounter of 07/21/24 (from the past 24 hours)  Troponin I (High Sensitivity)     Status: None   Collection Time: 07/21/24 10:44 AM  Result Value Ref Range   Troponin I (High Sensitivity) 11 <18 ng/L  Basic metabolic panel     Status: Abnormal   Collection Time: 07/22/24  7:36 AM  Result Value Ref Range   Sodium 137 135 - 145 mmol/L   Potassium 3.3 (L) 3.5 - 5.1 mmol/L   Chloride 100 98 - 111 mmol/L   CO2 27 22 - 32 mmol/L   Glucose, Bld 128 (H) 70 - 99 mg/dL   BUN 10 8 - 23 mg/dL   Creatinine, Ser 9.16 0.44 - 1.00 mg/dL   Calcium  9.7 8.9 - 10.3 mg/dL   GFR, Estimated >39 >39 mL/min   Anion gap 10 5 - 15     Imaging or Labs ordered: None  Medical history and chart was reviewed and case discussed with medical provider.  Assessment/Plan: 89 year old female with a right periprosthetic femur fracture.  Due to the unstable nature of her injury I recommend proceeding with open reduction total fixation.  Risks and benefits were discussed with the patient and her sons.  Risks include but not limited to bleeding, infection, malunion, nonunion, hardware failure, hardware irritation, nerve and blood vessel injury, DVT, knee stiffness, even possibility anesthetic complications.  They agreed to proceed with surgery and consent was obtained.  Leah MYRTIS Light, MD [facility] [phone] (office) [email or web address]

## 2024-07-22 NOTE — Plan of Care (Signed)
   Problem: Education: Goal: Knowledge of General Education information will improve Description Including pain rating scale, medication(s)/side effects and non-pharmacologic comfort measures Outcome: Progressing

## 2024-07-22 NOTE — Anesthesia Procedure Notes (Signed)
 Procedure Name: Intubation Date/Time: 07/22/2024 10:40 AM  Performed by: Scherrie Mast, CRNAPre-anesthesia Checklist: Patient identified, Emergency Drugs available, Suction available and Patient being monitored Patient Re-evaluated:Patient Re-evaluated prior to induction Oxygen Delivery Method: Circle System Utilized Preoxygenation: Pre-oxygenation with 100% oxygen Induction Type: IV induction Ventilation: Mask ventilation without difficulty Laryngoscope Size: Mac and 4 Grade View: Grade I Tube type: Oral Tube size: 7.0 mm Number of attempts: 1 Airway Equipment and Method: Stylet and Oral airway Placement Confirmation: ETT inserted through vocal cords under direct vision, positive ETCO2 and breath sounds checked- equal and bilateral Secured at: 20 cm Tube secured with: Tape Dental Injury: Teeth and Oropharynx as per pre-operative assessment

## 2024-07-22 NOTE — TOC Initial Note (Signed)
 Transition of Care [facility]) - Initial/Assessment Note    Patient Details  Name: Leah Holland MRN: [id number] Date of Birth: January 09, 1922  Transition of Care [facility]) CM/SW Contact:    Nola Devere Hands, RN Phone Number: 07/22/2024, 3:31 PM  Clinical Narrative:                 89 yr old female s/p fall at [facility], [facility] [place]. Patient underwent ORIF of right femur.PT/OT to eval.  TOC Team will continue to follow.        Patient Goals and CMS Choice            Expected Discharge Plan and Services                                              Prior Living Arrangements/Services                       Activities of Daily Living   ADL Screening (condition at time of admission) Independently performs ADLs?: Yes (appropriate for developmental age) Is the patient deaf or have difficulty hearing?: Yes Does the patient have difficulty seeing, even when wearing glasses/contacts?: No Does the patient have difficulty concentrating, remembering, or making decisions?: No  Permission Sought/Granted                  Emotional Assessment              Admission diagnosis:  Femur fracture, right (HCC) [S72.91XA] Closed fracture of right femur, unspecified fracture morphology, initial encounter (HCC) [S72.91XA] Patient Active Problem List   Diagnosis Date Noted   Femur fracture, right (HCC) 07/21/2024   Grieving 05/03/2023   Complicated grief 05/03/2023   Prosthetic joint infection (HCC) 05/02/2023   Mild cognitive impairment 11/16/2022   Flatulence 11/16/2022   Palliative care encounter 03/08/2022   Stress incontinence 03/08/2022   Endometrial carcinoma (HCC) 02/28/2022   Pelvic mass in female 11/02/2021   Aortic atherosclerosis (HCC) 09/30/2021   Pelvic mass 09/29/2021   Dyspnea on exertion 02/12/2021   Hypophosphatemia 10/07/2020   Hypomagnesemia 10/07/2020   Hypokalemia 10/07/2020   Periprosthetic fracture around internal  prosthetic left hip joint (HCC) 10/03/2020   Leg fracture, left, closed, initial encounter 10/01/2020   Hypertensive urgency 10/22/2018   Chronic low back pain 12/21/2017   Chronic antibiotic suppression 06/13/2017   History of hip replacement 06/09/2017   Ecchymosis 02/23/2017   Closed intertrochanteric fracture with nonunion, left 01/26/2017   Leukocytosis 01/26/2017   Fall 01/26/2017   Prosthetic hip infection (HCC) 08/22/2016   Tendinopathy 08/22/2016   Depression, recurrent (HCC) 04/07/2016   Arthritis of knee, degenerative 04/07/2016   OP (osteoporosis) 04/07/2016   Agoraphobia with panic disorder 04/07/2016   Chronic anticoagulation 08/06/2015   Bipolar disorder (HCC) 08/04/2015   Hyponatremia 08/04/2015   Constipation 08/04/2015   Complication of anesthesia 07/30/2015   Diabetes mellitus type 2, diet-controlled (HCC)    Skin cancer of face    Paroxysmal atrial fibrillation (HCC) 09/01/2014   Right carotid bruit 11/18/2013   Postoperative anemia due to acute blood loss 03/03/2012   Hypertension    Hyperlipidemia    Swelling of extremity    Cataract    Hypothyroidism    GERD (gastroesophageal reflux disease)    Arthritis    Insomnia    PCP:  Cyrena Leech  SHAUNNA, MD (Inactive) Pharmacy:   [facility] [place], [place] - [address] [place] [address] Phone: [phone] Fax: [phone]  [facility] [place], [place] - [address] [place] [address] Phone: [phone] Fax: [phone]     Social Drivers of Health (SDOH) Social History: SDOH Screenings   Food Insecurity: No Food Insecurity (07/21/2024)  Housing: Low Risk  (07/21/2024)  Transportation Needs: No Transportation Needs (07/21/2024)  Utilities: Not At Risk (07/21/2024)  Depression (PHQ2-9): Low Risk  (02/28/2022)  Social Connections: Unknown (07/21/2024)  Tobacco Use: Low Risk  (07/22/2024)    SDOH Interventions:     Readmission Risk Interventions     No data to display

## 2024-07-22 NOTE — TOC CAGE-AID Note (Signed)
 Transition of Care [facility]) - CAGE-AID Screening   Patient Details  Name: Leah Holland MRN: [id number] Date of Birth: August 22, 1922  Transition of Care [facility]) CM/SW Contact:    Luka Reisch E Shell Yandow, LCSW Phone Number: 07/22/2024, 8:48 AM   Clinical Narrative:    CAGE-AID Screening:    Have You Ever Felt You Ought to Cut Down on Your Drinking or Drug Use?: No Have People Annoyed You By [employer] Your Drinking Or Drug Use?: No Have You Felt Bad Or Guilty About Your Drinking Or Drug Use?: No Have You Ever Had a Drink or Used Drugs First Thing In The Morning to Steady Your Nerves or to Get Rid of a Hangover?: No CAGE-AID Score: 0  Substance Abuse Education Offered: No

## 2024-07-22 NOTE — Transfer of Care (Signed)
 Immediate Anesthesia Transfer of Care Note  Patient: Leah Holland  Procedure(s) Performed: OPEN REDUCTION INTERNAL FIXATION (ORIF) DISTAL FEMUR FRACTURE (Right)  Patient Location: PACU  Anesthesia Type:General  Level of Consciousness: awake, alert , and oriented  Airway & Oxygen Therapy: Patient Spontanous Breathing and Patient connected to nasal cannula oxygen  Post-op Assessment: Report given to RN and Post -op Vital signs reviewed and stable  Post vital signs: Reviewed and stable  Last Vitals:  Vitals Value Taken Time  BP 145/67 07/22/24 12:00  Temp 36.4 C 07/22/24 11:55  Pulse 82 07/22/24 12:01  Resp 0 07/22/24 12:01  SpO2 92 % 07/22/24 12:01  Vitals shown include unfiled device data.  Last Pain:  Vitals:   07/22/24 0950  TempSrc:   PainSc: 0-No pain      Patients Stated Pain Goal: 2 (07/22/24 0950)  Complications: No notable events documented.

## 2024-07-22 NOTE — Progress Notes (Signed)
 Orthopedic Tech Progress Note Patient Details:  Leah Holland Jul 16, 1922 [id number]  Patient is over the age limitation regarding over head frame.  [name], female   DOB: 17-Jul-1922, 89 y.o.   MRN: [id number]  Delanna LITTIE Pac 07/22/2024, 1:21 PM

## 2024-07-23 ENCOUNTER — Inpatient Hospital Stay (HOSPITAL_COMMUNITY)

## 2024-07-23 ENCOUNTER — Encounter (HOSPITAL_COMMUNITY): Payer: Self-pay | Admitting: Student

## 2024-07-23 DIAGNOSIS — R55 Syncope and collapse: Secondary | ICD-10-CM

## 2024-07-23 DIAGNOSIS — S7291XA Unspecified fracture of right femur, initial encounter for closed fracture: Secondary | ICD-10-CM | POA: Diagnosis not present

## 2024-07-23 LAB — CBC
HCT: 33.2 % — ABNORMAL LOW (ref 36.0–46.0)
HCT: 35.1 % — ABNORMAL LOW (ref 36.0–46.0)
Hemoglobin: 10.9 g/dL — ABNORMAL LOW (ref 12.0–15.0)
Hemoglobin: 11.3 g/dL — ABNORMAL LOW (ref 12.0–15.0)
MCH: 32.2 pg (ref 26.0–34.0)
MCH: 32.1 pg (ref 26.0–34.0)
MCHC: 32.8 g/dL (ref 30.0–36.0)
MCHC: 32.2 g/dL (ref 30.0–36.0)
MCV: 97.9 fL (ref 80.0–100.0)
MCV: 99.7 fL (ref 80.0–100.0)
Platelets: 182 K/uL (ref 150–400)
Platelets: 191 K/uL (ref 150–400)
RBC: 3.39 MIL/uL — ABNORMAL LOW (ref 3.87–5.11)
RBC: 3.52 MIL/uL — ABNORMAL LOW (ref 3.87–5.11)
RDW: 13.9 % (ref 11.5–15.5)
RDW: 13.7 % (ref 11.5–15.5)
WBC: 11.7 K/uL — ABNORMAL HIGH (ref 4.0–10.5)
WBC: 16.4 K/uL — ABNORMAL HIGH (ref 4.0–10.5)
nRBC: 0 % (ref 0.0–0.2)
nRBC: 0 % (ref 0.0–0.2)

## 2024-07-23 LAB — ECHOCARDIOGRAM COMPLETE BUBBLE STUDY
AR max vel: 2.43 cm2
AV Area VTI: 2.48 cm2
AV Area mean vel: 2.38 cm2
AV Mean grad: 5 mmHg
AV Peak grad: 9.1 mmHg
Ao pk vel: 1.51 m/s
Area-P 1/2: 4.31 cm2
S' Lateral: 2.2 cm

## 2024-07-23 LAB — BASIC METABOLIC PANEL WITH GFR
Anion gap: 7 (ref 5–15)
BUN: 17 mg/dL (ref 8–23)
CO2: 29 mmol/L (ref 22–32)
Calcium: 9.6 mg/dL (ref 8.9–10.3)
Chloride: 99 mmol/L (ref 98–111)
Creatinine, Ser: 1.06 mg/dL — ABNORMAL HIGH (ref 0.44–1.00)
GFR, Estimated: 47 mL/min — ABNORMAL LOW (ref >60)
Glucose, Bld: 161 mg/dL — ABNORMAL HIGH (ref 70–99)
Potassium: 4.5 mmol/L (ref 3.5–5.1)
Sodium: 135 mmol/L (ref 135–145)

## 2024-07-23 LAB — VITAMIN D 25 HYDROXY (VIT D DEFICIENCY, FRACTURES): Vit D, 25-Hydroxy: 70.72 ng/mL (ref 30–100)

## 2024-07-23 MED ORDER — FUROSEMIDE 10 MG/ML IJ SOLN
20.0000 mg | Freq: Once | INTRAMUSCULAR | Status: AC
  Administered 2024-07-23 (×2): 20 mg via INTRAVENOUS
  Filled 2024-07-23: qty 2

## 2024-07-23 MED ORDER — TEMAZEPAM 7.5 MG PO CAPS
15.0000 mg | ORAL_CAPSULE | Freq: Once | ORAL | Status: AC
  Administered 2024-07-23 (×2): 15 mg via ORAL
  Filled 2024-07-23: qty 2

## 2024-07-23 MED ORDER — APIXABAN 2.5 MG PO TABS
2.5000 mg | ORAL_TABLET | Freq: Two times a day (BID) | ORAL | Status: DC
  Administered 2024-07-23 – 2024-07-25 (×9): 2.5 mg via ORAL
  Filled 2024-07-23 (×5): qty 1

## 2024-07-23 MED ORDER — OXYCODONE HCL 5 MG PO TABS: 2.5000 mg | ORAL_TABLET | Freq: Four times a day (QID) | ORAL | Status: DC | PRN

## 2024-07-23 MED ORDER — SULFAMETHOXAZOLE-TRIMETHOPRIM 400-80 MG PO TABS
1.0000 | ORAL_TABLET | Freq: Every day | ORAL | Status: DC
  Administered 2024-07-24 – 2024-07-25 (×3): 1 via ORAL
  Filled 2024-07-23 (×2): qty 1

## 2024-07-23 NOTE — Plan of Care (Signed)
  Problem: Activity: Goal: Risk for activity intolerance will decrease Outcome: Progressing   Problem: Nutrition: Goal: Adequate nutrition will be maintained Outcome: Progressing   Problem: Coping: Goal: Level of anxiety will decrease Outcome: Progressing   Problem: Pain Managment: Goal: General experience of comfort will improve and/or be controlled Outcome: Progressing   Problem: Safety: Goal: Ability to remain free from injury will improve Outcome: Progressing

## 2024-07-23 NOTE — Progress Notes (Signed)
 PHARMACY NOTE:  ANTIMICROBIAL RENAL DOSAGE ADJUSTMENT  Current antimicrobial regimen includes a mismatch between antimicrobial dosage and estimated renal function.  As per policy approved by the Pharmacy & Therapeutics and Medical Executive Committees, the antimicrobial dosage will be adjusted accordingly.  Current antimicrobial dosage:  bactrim  DS BID  Indication: chronic serratia THA infection s/p hardware removal  Renal Function:  Estimated Creatinine Clearance: 23.9 mL/min (A) (by C-G formula based on SCr of 1.06 mg/dL (H)).     Antimicrobial dosage has been changed to:  Bactrim  DS daily     Thank you for allowing pharmacy to be a part of this patient's care.  Jinnie JONETTA Door, [facility] 07/23/2024 10:55 AM

## 2024-07-23 NOTE — Plan of Care (Signed)
  Problem: Pain Managment: Goal: General experience of comfort will improve and/or be controlled Outcome: Progressing

## 2024-07-23 NOTE — Progress Notes (Signed)
 PHARMACY - ANTICOAGULATION CONSULT NOTE  Pharmacy Consult for apixaban  Indication: atrial fibrillation  Allergies  Allergen Reactions   Levaquin [Levofloxacin] Other (See Comments)    tendinopathy   Morphine  And Codeine Other (See Comments)    Made pt wild   Morphine  Sulfate     Other reaction(s): made pt wild    Patient Measurements: Height: 5' 3 (160 cm) Weight: 58.9 kg (129 lb 13.6 oz) IBW/kg (Calculated) : 52.4 HEPARIN  DW (KG): 58.9  Vital Signs: Temp: 98 F (36.7 C) (08/12 0339) Temp Source: Axillary (08/12 0339) BP: 122/57 (08/12 0339) Pulse Rate: 79 (08/12 0339)  Labs: Recent Labs    07/21/24 0849 07/21/24 1044 07/22/24 0736 07/23/24 0622  HGB 16.0*  --   --  10.9*  HCT 48.6*  --   --  33.2*  PLT 265  --   --  182  CREATININE 1.20*  --  0.83 1.06*  TROPONINIHS 8 11  --   --     Estimated Creatinine Clearance: 23.9 mL/min (A) (by C-G formula based on SCr of 1.06 mg/dL (H)).   Medical History: Past Medical History:  Diagnosis Date   Anxiety attack    take RX prn (08/13/2013)   Arthritis    probably (08/13/2013)   Atrial fibrillation (HCC)    new onset/pt report 08/13/2013   Bladder wall thickening 07/01/2021   CT scan in Epic   Bronchitis 12/2011   Cholelithiasis 07/01/2021   CT in Epic.   Common bile duct dilation 07/01/2021   CT in Epic:  13 mm.   Complication of anesthesia    Compression fracture of first lumbar vertebra (HCC) 07/01/2021   CT in Epic.   Depression    Femur fracture (HCC)    left proximal femur non-union   GERD (gastroesophageal reflux disease)    Grieving 05/03/2023   H/O hiatal hernia    Hardware complicating wound infection (HCC) 08/22/2016   History of blood transfusion    w/both knee OR's (08/13/2013)   Hyperlipidemia    Hyperlipidemia    Hypertension    Hypertensive urgency 10/22/2018   Hypothyroidism    takes synthroid    Insomnia    Intertrochanteric fracture of right hip (HCC) 07/30/2015   Neuromuscular  disorder (HCC)    carpal tunnel in right hand   Periprosthetic fracture around internal prosthetic right hip joint (HCC), nonunion basicervical femoral neck with broken hardware and subtrochanteric fracture 03/16/2016   PONV (postoperative nausea and vomiting)    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 Court, cardiologist 1 x year [phone]   Tendon dysfunction 08/22/2016   Wears hearing aid    [facility]     Assessment: 89 yo woman with Right periprosthetic distal femur fracture s/p ORIF. Pharmacy consulted to restart apixaban  if Hgb >/= 10 on POD1.   Hgb is 10.9. Of note, Hgb was 16 on 8/10, suspect some hemoconcentration though patient has had Hgb 14-15s in the last 2 years. OK to restart apixaban  per ortho.   Goal of Therapy:  Monitor platelets by anticoagulation protocol: Yes   Plan:  Restart apixaban  2.5mg  BID Monitor signs/symptoms of bleeding   Jinnie Door, PharmD, BCPS, [facility] Clinical Pharmacist  Please check  AMION for all [facility] Pharmacy phone numbers After 10:00 PM, call Main Pharmacy [phone]

## 2024-07-23 NOTE — Evaluation (Signed)
 Physical Therapy Evaluation  Patient Details Name: Leah Holland MRN: [id number] DOB: Jan 15, 1922 Today's Date: 07/23/2024  History of Present Illness  Pt is a 89 y/o female who presents 07/21/2024 after a mechanical fall, sustaining a R femur fracture. She is now s/p ORIF of R distal femur fx on 8/11. PMH significant for a-fib, HTN, and known multiple orthopedic issues.  Clinical Impression  Pt admitted with above diagnosis. Pt currently with functional limitations due to the deficits listed below (see PT Problem List). At the time of PT eval pt was able to perform transfers bed>BSC>recliner with +2 assist and RW for support. Focus of session was the void on BSC. Pt successful and was able to void a substantial amount of urine. Anticipate pt will progress well with continued therapies <3 hours/day. Pt will benefit from acute skilled PT to increase their independence and safety with mobility to allow discharge.           If plan is discharge home, recommend the following: A lot of help with walking and/or transfers;A lot of help with bathing/dressing/bathroom;Assistance with cooking/housework;Assist for transportation;Help with stairs or ramp for entrance   Can travel by private vehicle   Yes    Equipment Recommendations None recommended by PT  Recommendations for Other Services       Functional Status Assessment Patient has had a recent decline in their functional status and demonstrates the ability to make significant improvements in function in a reasonable and predictable amount of time.     Precautions / Restrictions Precautions Precautions: Fall Recall of Precautions/Restrictions: Intact Restrictions Weight Bearing Restrictions Per Provider Order: Yes RLE Weight Bearing Per Provider Order: Weight bearing as tolerated      Mobility  Bed Mobility Overal bed mobility: Needs Assistance Bed Mobility: Supine to Sit     Supine to sit: Mod assist     General bed mobility  comments: Intermittent assist for RLE movement and trunk support as pt transitioned around to EOB.    Transfers Overall transfer level: Needs assistance Equipment used: Rolling walker (2 wheels) Transfers: Sit to/from Stand, Bed to chair/wheelchair/BSC Sit to Stand: Min assist, +2 physical assistance, +2 safety/equipment   Step pivot transfers: Min assist, +2 physical assistance, +2 safety/equipment       General transfer comment: VC's for sequencing and safety. Pt stepped from bed>BSC to the L, and then again BSC>recliner to the L.    Ambulation/Gait               General Gait Details: Deferred gait training at this time.  Stairs            Wheelchair Mobility     Tilt Bed    Modified Rankin (Stroke Patients Only)       Balance Overall balance assessment: Needs assistance Sitting-balance support: Feet supported Sitting balance-Leahy Scale: Fair     Standing balance support: Bilateral upper extremity supported, During functional activity Standing balance-Leahy Scale: Poor                               Pertinent Vitals/Pain Pain Assessment Pain Assessment: Faces Faces Pain Scale: Hurts a little bit Pain Location: RLE Pain Descriptors / Indicators: Sore Pain Intervention(s): Limited activity within patient's tolerance, Monitored during session, Repositioned    Home Living Family/patient expects to be discharged to:: Other (Comment) (Pt reports independent living at [facility])  Home Equipment: Agricultural consultant (2 wheels);Grab bars - toilet;Grab bars - tub/shower;BSC/3in1 (motorized chair) Additional Comments: [facility], ILF    Prior Function Prior Level of Function : Independent/Modified Independent             Mobility Comments: mod I with DME ADLs Comments: mod I for BADLs with DME, uses motorized chair to get to dining hall     Extremity/Trunk Assessment   Upper Extremity  Assessment Upper Extremity Assessment: Generalized weakness (Age appropriate strength)    Lower Extremity Assessment Lower Extremity Assessment: RLE deficits/detail;LLE deficits/detail RLE Deficits / Details: Acute pain, decreased strength and AROM consistent with above mentioned surgery LLE Deficits / Details: Age appropriate strength    Cervical / Trunk Assessment Cervical / Trunk Assessment: Kyphotic  Communication   Communication Communication: Impaired Factors Affecting Communication: Hearing impaired    Cognition Arousal: Alert Behavior During Therapy: WFL for tasks assessed/performed   PT - Cognitive impairments: No apparent impairments                         Following commands: Intact       Cueing Cueing Techniques: Verbal cues     General Comments General comments (skin integrity, edema, etc.): VSS    Exercises     Assessment/Plan    PT Assessment Patient needs continued PT services  PT Problem List Decreased strength;Decreased range of motion;Decreased activity tolerance;Decreased balance;Decreased mobility;Decreased safety awareness;Decreased knowledge of use of DME;Pain       PT Treatment Interventions DME instruction;Gait training;Functional mobility training;Therapeutic activities;Therapeutic exercise;Balance training;Patient/family education    PT Goals (Current goals can be found in the Care Plan section)  Acute Rehab PT Goals Patient Stated Goal: Be able to return to independent living PT Goal Formulation: With patient Time For Goal Achievement: 08/06/24 Potential to Achieve Goals: Good    Frequency Min 2X/week     Co-evaluation               AM-PAC PT 6 Clicks Mobility  Outcome Measure Help needed turning from your back to your side while in a flat bed without using bedrails?: A Lot Help needed moving from lying on your back to sitting on the side of a flat bed without using bedrails?: A Lot Help needed moving to and  from a bed to a chair (including a wheelchair)?: A Lot Help needed standing up from a chair using your arms (e.g., wheelchair or bedside chair)?: A Lot Help needed to walk in hospital room?: Total Help needed climbing 3-5 steps with a railing? : Total 6 Click Score: 10    End of Session Equipment Utilized During Treatment: Gait belt Activity Tolerance: Patient tolerated treatment well Patient left: in chair;with call bell/phone within reach;with chair alarm set Nurse Communication: Mobility status PT Visit Diagnosis: Unsteadiness on feet (R26.81);Pain Pain - Right/Left: Right Pain - part of body: Hip;Knee    Time: 8987-8964 PT Time Calculation (min) (ACUTE ONLY): 23 min   Charges:   PT Evaluation $PT Eval Moderate Complexity: 1 Mod   PT General Charges $$ ACUTE PT VISIT: 1 Visit         Leita Sable, PT, DPT Acute Rehabilitation Services Secure Chat Preferred Office: [phone]   Leita JONETTA Sable 07/23/2024, 1:21 PM

## 2024-07-23 NOTE — Progress Notes (Signed)
*  PRELIMINARY RESULTS* Echocardiogram 2D Echocardiogram has been performed.  Benard FORBES Stallion 07/23/2024, 9:28 AM

## 2024-07-23 NOTE — Progress Notes (Signed)
 PROGRESS NOTE    Leah Holland  FMW:[id number] DOB: Jul 09, 1922 DOA: 07/21/2024 PCP: [name], MD (Inactive)    Brief Narrative:  Leah Holland is a 89 y.o. 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 Appears to be a mechanical fall.  [facility]   Assessment and Plan:  RLE distal femur fracture 2/2 GLF -Orthopedic surgery consulted-plan for surgery on 8/11 -Multimodal pain control w/ Tylenol  1g TID, gabapentin  300mg  TID, and oxycodone  5mg  q4h prn -Appears to be mechanical fall versus syncope-EEG within normal limits, echo done with preserved EF but some volume overload- IV lasix  x 1 -PT/OT after surgery-- ? SNF portion of [facility]  Atrial fibrillation - On Eliquis   - Continue rate controlling medications   Hypothyroidism - Continue Synthroid   Hypokalemia -replete  Anemia -recheck and trend -some expected post op blood loss  history of total knee replacement as well as right total hip replacement. - positive culture on her total hip replacement and they have been treating that on the right side with suppressive antibiotics with infectious disease. She is been on Bactrim  for years -- will resume  DVT prophylaxis: apixaban  (ELIQUIS ) tablet 2.5 mg Start: 07/23/24 1000 SCDs Start: 07/22/24 1309 SCDs Start: 07/21/24 1341 apixaban  (ELIQUIS ) tablet 2.5 mg    Code Status: Full Code Family Communication: Son at bedside  Disposition Plan:  Level of care: Med-Surg Status is: Inpatient     Consultants:  Orthopedics  Subjective: Just got echo-- does not think pure wick is working   Objective: Vitals:   07/23/24 0014 07/23/24 0339 07/23/24 0939 07/23/24 0946  BP: 132/76 (!) 122/57 (!)  150/64 (!) 150/64  Pulse: 77 79 88 88  Resp: 17 17 17    Temp: 98.1 F (36.7 C) 98 F (36.7 C) 97.9 F (36.6 C)   TempSrc: Oral Axillary Oral   SpO2: 95% 95% 97%   Weight:      Height:        Intake/Output Summary (Last 24 hours) at 07/23/2024 1130 Last data filed at 07/23/2024 0300 Gross per 24 hour  Intake 1060 ml  Output --  Net 1060 ml   Filed Weights   07/21/24 0840 07/22/24 0923  Weight: 59 kg 58.9 kg    Examination:   General: Appearance:    Well developed, well nourished female in no acute distress     Lungs:      respirations unlabored  Heart:    Normal heart rate.   MS:   All extremities are intact.   Neurologic:   Awake, alert       Data Reviewed: I have personally reviewed following labs and imaging studies  CBC: Recent Labs  Lab 07/21/24 0849 07/23/24 0622 07/23/24 0928  WBC 9.1 11.7* 16.4*  NEUTROABS 6.5  --   --   HGB 16.0* 10.9* 11.3*  HCT 48.6* 33.2* 35.1*  MCV 95.9 97.9 99.7  PLT 265 182 191   Basic Metabolic Panel: Recent Labs  Lab 07/21/24 0849 07/22/24 0736 07/23/24 0622  NA 138 137 135  K 3.5 3.3* 4.5  CL 99 100 99  CO2 27 27 29   GLUCOSE 150* 128* 161*  BUN 20 10 17   CREATININE 1.20* 0.83 1.06*  CALCIUM  10.9* 9.7 9.6   GFR: Estimated  Creatinine Clearance: 23.9 mL/min (A) (by C-G formula based on SCr of 1.06 mg/dL (H)). Liver Function Tests: Recent Labs  Lab 07/21/24 0849  AST 22  ALT 20  ALKPHOS 105  BILITOT 0.9  PROT 7.2  ALBUMIN  3.9   No results for input(s): LIPASE, AMYLASE in the last 168 hours. No results for input(s): AMMONIA in the last 168 hours. Coagulation Profile: No results for input(s): INR, PROTIME in the last 168 hours. Cardiac Enzymes: No results for input(s): CKTOTAL, CKMB, CKMBINDEX, TROPONINI in the last 168 hours. BNP (last 3 results) No results for input(s): PROBNP in the last 8760 hours. HbA1C: No results for input(s): HGBA1C in the last 72 hours. CBG: No results  for input(s): GLUCAP in the last 168 hours. Lipid Profile: No results for input(s): CHOL, HDL, LDLCALC, TRIG, CHOLHDL, LDLDIRECT in the last 72 hours. Thyroid  Function Tests: No results for input(s): TSH, T4TOTAL, FREET4, T3FREE, THYROIDAB in the last 72 hours. Anemia Panel: No results for input(s): VITAMINB12, FOLATE, FERRITIN, TIBC, IRON, RETICCTPCT in the last 72 hours. Sepsis Labs: No results for input(s): PROCALCITON, LATICACIDVEN in the last 168 hours.  Recent Results (from the past 240 hours)  Surgical pcr screen     Status: None   Collection Time: 07/22/24  8:48 AM   Specimen: Nasal Mucosa; Nasal Swab  Result Value Ref Range Status   MRSA, PCR NEGATIVE NEGATIVE Final   Staphylococcus aureus NEGATIVE NEGATIVE Final    Comment: (NOTE) The Xpert SA Assay (FDA approved for NASAL specimens in patients 76 years of age and older), is one component of a comprehensive surveillance program. It is not intended to diagnose infection nor to guide or monitor treatment. Performed at [facility], [address] [place], [place] [address]          Radiology Studies: ECHOCARDIOGRAM COMPLETE BUBBLE STUDY Result Date: 07/23/2024    ECHOCARDIOGRAM REPORT   Patient Name:   Leah Holland Date of Exam: 07/23/2024 Medical Rec #:  [id number]        Height:       63.0 in Accession #:    [id number]       Weight:       129.9 lb Date of Birth:  10-27-1922        BSA:          1.609 m Patient Age:    89 years         BP:           122/57 mmHg Patient Gender: F                HR:           103 bpm. Exam Location:  Inpatient Procedure: 2D Echo, Cardiac Doppler and Color Doppler (Both Spectral and Color            Flow Doppler were utilized during procedure). Indications:    Syncope 780.2 / R55  History:        Patient has prior history of Echocardiogram examinations, most                 recent 01/18/2021. Risk Factors:Hypertension.  Sonographer:    Benard Stallion Referring Phys: [id number] SARAH A MCCLUNG IMPRESSIONS  1. Left ventricular ejection fraction, by estimation, is 65 to 70%. The left ventricle has normal function. The left ventricle has no regional wall motion abnormalities. Left ventricular diastolic function could not be evaluated. There is the interventricular septum is flattened in  systole and diastole, consistent with right ventricular pressure and volume overload.  2. Right ventricular systolic function is low normal. The right ventricular size is normal. There is moderately elevated pulmonary artery systolic pressure. The estimated right ventricular systolic pressure is 49.5 mmHg.  3. Left atrial size was severely dilated.  4. Right atrial size was severely dilated.  5. The mitral valve is degenerative. Moderate mitral valve regurgitation.  6. The tricuspid valve is abnormal. Tricuspid valve regurgitation is severe.  7. The aortic valve is tricuspid. There is mild calcification of the aortic valve. Aortic valve regurgitation is not visualized. Aortic valve sclerosis is present, with no evidence of aortic valve stenosis.  8. The inferior vena cava is normal in size with greater than 50% respiratory variability, suggesting right atrial pressure of 3 mmHg.  9. Agitated saline contrast bubble study was negative, with no evidence of any interatrial shunt. Comparison(s): No significant change from prior study. FINDINGS  Left Ventricle: Left ventricular ejection fraction, by estimation, is 65 to 70%. The left ventricle has normal function. The left ventricle has no regional wall motion abnormalities. The left ventricular internal cavity size was normal in size. There is  no left ventricular hypertrophy. The interventricular septum is flattened in systole and diastole, consistent with right ventricular pressure and volume overload. Left ventricular diastolic function could not be evaluated due to atrial fibrillation. Left ventricular diastolic function could  not be evaluated. Right Ventricle: The right ventricular size is normal. No increase in right ventricular wall thickness. Right ventricular systolic function is low normal. There is moderately elevated pulmonary artery systolic pressure. The tricuspid regurgitant velocity  is 3.41 m/s, and with an assumed right atrial pressure of 3 mmHg, the estimated right ventricular systolic pressure is 49.5 mmHg. Left Atrium: Left atrial size was severely dilated. Right Atrium: Right atrial size was severely dilated. Pericardium: There is no evidence of pericardial effusion. Mitral Valve: The mitral valve is degenerative in appearance. There is mild calcification of the anterior mitral valve leaflet(s). Mild to moderate mitral annular calcification. Moderate mitral valve regurgitation. Tricuspid Valve: The tricuspid valve is abnormal. Tricuspid valve regurgitation is severe. No evidence of tricuspid stenosis. Aortic Valve: The aortic valve is tricuspid. There is mild calcification of the aortic valve. Aortic valve regurgitation is not visualized. Aortic valve sclerosis is present, with no evidence of aortic valve stenosis. Aortic valve mean gradient measures 5.0 mmHg. Aortic valve peak gradient measures 9.1 mmHg. Aortic valve area, by VTI measures 2.48 cm. Pulmonic Valve: The pulmonic valve was grossly normal. Pulmonic valve regurgitation is mild. No evidence of pulmonic stenosis. Aorta: The aortic root and ascending aorta are structurally normal, with no evidence of dilitation. Venous: The inferior vena cava is normal in size with greater than 50% respiratory variability, suggesting right atrial pressure of 3 mmHg. IAS/Shunts: No atrial level shunt detected by color flow Doppler. Agitated saline contrast was given intravenously to evaluate for intracardiac shunting. Agitated saline contrast bubble study was negative, with no evidence of any interatrial shunt.  LEFT VENTRICLE PLAX 2D LVIDd:         3.80 cm   Diastology LVIDs:          2.20 cm   LV e' medial:    10.70 cm/s LV PW:         1.00 cm   LV E/e' medial:  12.7 LV IVS:        1.00 cm   LV e' lateral:   17.00 cm/s LVOT diam:  2.00 cm   LV E/e' lateral: 8.0 LV SV:         67 LV SV Index:   42 LVOT Area:     3.14 cm  RIGHT VENTRICLE RV Basal diam:  3.90 cm RV Mid diam:    2.50 cm RV S prime:     15.90 cm/s TAPSE (M-mode): 2.4 cm LEFT ATRIUM             Index        RIGHT ATRIUM           Index LA diam:        4.90 cm 3.04 cm/m   RA Area:     40.20 cm LA Vol (A2C):   98.0 ml 60.89 ml/m  RA Volume:   155.00 ml 96.31 ml/m LA Vol (A4C):   60.7 ml 37.72 ml/m LA Biplane Vol: 77.7 ml 48.28 ml/m  AORTIC VALVE AV Area (Vmax):    2.43 cm AV Area (Vmean):   2.38 cm AV Area (VTI):     2.48 cm AV Vmax:           151.00 cm/s AV Vmean:          103.000 cm/s AV VTI:            0.270 m AV Peak Grad:      9.1 mmHg AV Mean Grad:      5.0 mmHg LVOT Vmax:         117.00 cm/s LVOT Vmean:        78.000 cm/s LVOT VTI:          0.213 m LVOT/AV VTI ratio: 0.79  AORTA Ao Root diam: 3.10 cm Ao Asc diam:  3.30 cm MITRAL VALVE                TRICUSPID VALVE MV Area (PHT): 4.31 cm     TR Peak grad:   46.5 mmHg MV Decel Time: 176 msec     TR Vmax:        341.00 cm/s MV E velocity: 136.00 cm/s MV A velocity: 32.80 cm/s   SHUNTS MV E/A ratio:  4.15         Systemic VTI:  0.21 m                             Systemic Diam: 2.00 cm Darryle Decent MD Electronically signed by Darryle Decent MD Signature Date/Time: 07/23/2024/10:27:40 AM    Final    DG FEMUR PORT, MIN 2 VIEWS RIGHT Result Date: 07/22/2024 CLINICAL DATA:  Femur fracture, postop. EXAM: RIGHT FEMUR PORTABLE 2 VIEW COMPARISON:  Preoperative imaging FINDINGS: Lateral plate and screw fixation of distal femoral fracture. Decreased fracture displacement from preoperative imaging. Previous hip in the arthroplasty. Recent postsurgical change includes air and edema in the soft tissues. IMPRESSION: ORIF of distal femoral fracture. Electronically Signed   By:  Andrea Gasman M.D.   On: 07/22/2024 14:16   DG FEMUR, MIN 2 VIEWS RIGHT Result Date: 07/22/2024 CLINICAL DATA:  Elective surgery. EXAM: RIGHT FEMUR 2 VIEWS COMPARISON:  Preoperative imaging FINDINGS: Seven fluoroscopic spot views of the right femur submitted from the operating room. Plate and screw fixation of distal femur fracture. Previous knee and hip arthroplasty. Fluoroscopy time 42 seconds. Dose 1.91 mGy. IMPRESSION: Intraoperative fluoroscopy during distal femur fracture ORIF. Electronically Signed   By: Andrea Gasman M.D.   On: 07/22/2024 14:15   DG C-Arm 1-60  Min-No Report Result Date: 07/22/2024 Fluoroscopy was utilized by the requesting physician.  No radiographic interpretation.   EEG adult Result Date: 07/21/2024 Shelton Arlin KIDD, MD     07/21/2024  4:39 PM Patient Name: ZION LINT MRN: [id number] Epilepsy Attending: Arlin KIDD Shelton Referring Physician/Provider: Georgina Basket, MD Date: 07/21/2024 Duration: 22.27 mins Patient history: 89yo F with syncope. EEG to evaluate for seizure Level of alertness: Awake AEDs during EEG study: GBP Technical aspects: This EEG study was done with scalp electrodes positioned according to the 10-20 International system of electrode placement. Electrical activity was reviewed with band pass filter of 1-70Hz , sensitivity of 7 uV/mm, display speed of 11mm/sec with a 60Hz  notched filter applied as appropriate. EEG data were recorded continuously and digitally stored.  Video monitoring was available and reviewed as appropriate. Description: The posterior dominant rhythm consists of 8-9 Hz activity of moderate voltage (25-35 uV) seen predominantly in posterior head regions, symmetric and reactive to eye opening and eye closing. Hyperventilation and photic stimulation were not performed.   IMPRESSION: This study is within normal limits. No seizures or epileptiform discharges were seen throughout the recording. A normal interictal EEG does not exclude the  diagnosis of epilepsy. Priyanka O Yadav        Scheduled Meds:  apixaban   2.5 mg Oral BID   diltiazem   180 mg Oral Daily   docusate sodium   100 mg Oral BID   gabapentin   300 mg Oral TID   levothyroxine   112 mcg Oral QAC breakfast   metoprolol  tartrate  50 mg Oral BID   pantoprazole   40 mg Oral Daily   [START ON 07/24/2024] sulfamethoxazole -trimethoprim   1 tablet Oral Daily   Continuous Infusions:     LOS: 2 days    Time spent: 45 minutes spent on chart review, discussion with nursing staff, consultants, updating family and interview/physical exam; more than 50% of that time was spent in counseling and/or coordination of care.    Harlene RAYMOND Bowl, DO [facility] Available via Epic secure chat 7am-7pm After these hours, please refer to coverage provider listed on [email or web address] 07/23/2024, 11:30 AM

## 2024-07-23 NOTE — Plan of Care (Signed)
  Problem: Pain Managment: Goal: General experience of comfort will improve and/or be controlled Outcome: Progressing   Problem: Safety: Goal: Ability to remain free from injury will improve Outcome: Progressing   Problem: Skin Integrity: Goal: Risk for impaired skin integrity will decrease Outcome: Progressing

## 2024-07-23 NOTE — Progress Notes (Signed)
 Gabriella from lab called pt's hgb dropped from 16 to 10.9. Notified Dr. Harlene Bowl and MD requested redraw. Notified Gabriella to redraw hgb per MD order.

## 2024-07-23 NOTE — TOC Progression Note (Signed)
 Transition of Care [facility]) - Progression Note    Patient Details  Name: Leah Holland MRN: [id number] Date of Birth: 17-Jun-1922  [facility]) CM/SW Contact  Miyuki Rzasa LITTIE Moose, LCSW Phone Number: 07/23/2024, 9:58 AM  Clinical Narrative:    Pt admitted from [facility]. CSW spoke with Hospital doctor from [employer], Hospital doctor stated that if pt needs SNF they have a bed available for her when she is medically ready to discharge, pt will need a 3 midnight stay before being admitted to SNF.  CSW will continue to follow.                    Expected Discharge Plan and Services                                               Social Drivers of Health (SDOH) Interventions SDOH Screenings   Food Insecurity: No Food Insecurity (07/21/2024)  Housing: Low Risk  (07/21/2024)  Transportation Needs: No Transportation Needs (07/21/2024)  Utilities: Not At Risk (07/21/2024)  Depression (PHQ2-9): Low Risk  (02/28/2022)  Social Connections: Unknown (07/21/2024)  Tobacco Use: Low Risk  (07/22/2024)    Readmission Risk Interventions     No data to display

## 2024-07-23 NOTE — Evaluation (Signed)
 Occupational Therapy Evaluation Patient Details Name: Leah Holland MRN: [id number] DOB: Jan 10, 1922 Today's Date: 07/23/2024   History of Present Illness   Leah Holland is a 89 y.o. female who presented after a GLF with R femur fracture. 8/11 ORIF of R distal femur fx. PMHx: fib, HTN, HLD, and known multiple orthopedic issues     Clinical Impressions Addaline was evaluated s/p the above admission list. She lives at ILF but is mod I for mobility and ADLs with DME at baseline. Upon evaluation the pt was limited by RLE pain, unsteady balance, decreased activity tolerance and generalized weakness. Overall she needed mod A to get to the EOB and min A +2 for STS and pivotal stepping with the RW, pt with good tolerance for Wbing on the RLE. Due to the deficits listed below the pt also needs up to mod A for LB ADLs and set up A for UB ADLs in sitting. Pt will benefit from continued acute OT services and skilled inpatient follow up therapy, <3 hours/day.      If plan is discharge home, recommend the following:   A little help with walking and/or transfers;A lot of help with bathing/dressing/bathroom;Assistance with cooking/housework;Direct supervision/assist for medications management;Direct supervision/assist for financial management;Assist for transportation;Help with stairs or ramp for entrance     Functional Status Assessment   Patient has had a recent decline in their functional status and demonstrates the ability to make significant improvements in function in a reasonable and predictable amount of time.     Equipment Recommendations   None recommended by OT      Precautions/Restrictions   Precautions Precautions: Fall Restrictions Weight Bearing Restrictions Per Provider Order: Yes RLE Weight Bearing Per Provider Order: Weight bearing as tolerated     Mobility Bed Mobility Overal bed mobility: Needs Assistance Bed Mobility: Supine to Sit     Supine to sit: Mod  assist          Transfers Overall transfer level: Needs assistance Equipment used: Rolling walker (2 wheels) Transfers: Sit to/from Stand, Bed to chair/wheelchair/BSC Sit to Stand: Min assist, +2 physical assistance, +2 safety/equipment     Step pivot transfers: Min assist, +2 physical assistance, +2 safety/equipment            Balance Overall balance assessment: Needs assistance Sitting-balance support: Feet supported Sitting balance-Leahy Scale: Fair     Standing balance support: Bilateral upper extremity supported, During functional activity Standing balance-Leahy Scale: Poor                             ADL either performed or assessed with clinical judgement   ADL Overall ADL's : Needs assistance/impaired Eating/Feeding: Independent;Sitting   Grooming: Set up;Sitting   Upper Body Bathing: Set up;Sitting   Lower Body Bathing: Moderate assistance;Sit to/from stand   Upper Body Dressing : Set up;Sitting   Lower Body Dressing: Moderate assistance;Sit to/from stand   Toilet Transfer: Minimal assistance;+2 for safety/equipment;+2 for physical assistance;Stand-pivot;BSC/3in1;Rolling walker (2 wheels)   Toileting- Clothing Manipulation and Hygiene: Contact guard assist;Sitting/lateral lean       Functional mobility during ADLs: Minimal assistance;+2 for safety/equipment;+2 for physical assistance General ADL Comments: limited by RLE pain, unsteady gait and decreased activity tolerance     Vision Baseline Vision/History: 1 Wears glasses Vision Assessment?: No apparent visual deficits     Perception Perception: Not tested       Praxis Praxis: Not tested  Pertinent Vitals/Pain Pain Assessment Pain Assessment: Faces Faces Pain Scale: Hurts a little bit Pain Location: RLE Pain Descriptors / Indicators: Discomfort Pain Intervention(s): Limited activity within patient's tolerance, Monitored during session     Extremity/Trunk Assessment  Upper Extremity Assessment Upper Extremity Assessment: Generalized weakness   Lower Extremity Assessment Lower Extremity Assessment: Defer to PT evaluation   Cervical / Trunk Assessment Cervical / Trunk Assessment: Kyphotic   Communication Communication Communication: Impaired Factors Affecting Communication: Hearing impaired   Cognition Arousal: Alert Behavior During Therapy: WFL for tasks assessed/performed Cognition: No apparent impairments                               Following commands: Intact       Cueing  General Comments   Cueing Techniques: Verbal cues  VSS           Home Living Family/patient expects to be discharged to:: Assisted living         Home Equipment: Rolling Walker (2 wheels);Grab bars - toilet;Grab bars - tub/shower;BSC/3in1 (motorized chair)   Additional Comments: [facility], ILF      Prior Functioning/Environment Prior Level of Function : Independent/Modified Independent             Mobility Comments: mod I with DME ADLs Comments: mod I for BADLs with DME, uses motorized chair to get to dining hall    OT Problem List: Decreased strength;Decreased activity tolerance;Decreased range of motion;Impaired balance (sitting and/or standing);Decreased safety awareness;Decreased knowledge of use of DME or AE;Decreased knowledge of precautions   OT Treatment/Interventions: Self-care/ADL training;Therapeutic exercise;DME and/or AE instruction;Therapeutic activities;Patient/family education;Balance training      OT Goals(Current goals can be found in the care plan section)   Acute Rehab OT Goals Patient Stated Goal: to go back to ILF OT Goal Formulation: With patient Time For Goal Achievement: 08/06/24 Potential to Achieve Goals: Good ADL Goals Pt Will Perform Lower Body Bathing: with contact guard assist;sit to/from stand Pt Will Perform Lower Body Dressing: with contact guard assist;sit to/from stand Pt  Will Transfer to Toilet: with contact guard assist;ambulating;bedside commode   OT Frequency:  Min 2X/week       AM-PAC OT 6 Clicks Daily Activity     Outcome Measure Help from another person eating meals?: None Help from another person taking care of personal grooming?: A Little Help from another person toileting, which includes using toliet, bedpan, or urinal?: A Little Help from another person bathing (including washing, rinsing, drying)?: A Lot Help from another person to put on and taking off regular upper body clothing?: A Little Help from another person to put on and taking off regular lower body clothing?: A Lot 6 Click Score: 17   End of Session Equipment Utilized During Treatment: Gait belt;Rolling walker (2 wheels) Nurse Communication: Mobility status  Activity Tolerance: Patient tolerated treatment well Patient left: in chair;with call bell/phone within reach;with chair alarm set  OT Visit Diagnosis: Unsteadiness on feet (R26.81);Other abnormalities of gait and mobility (R26.89);Muscle weakness (generalized) (M62.81);History of falling (Z91.81)                Time: 8987-8964 OT Time Calculation (min): 23 min Charges:  OT General Charges $OT Visit: 1 Visit OT Evaluation $OT Eval Moderate Complexity: 1 Mod  Lucie Kendall, OTR/L Acute Rehabilitation Services Office [phone] Secure Chat Communication Preferred   Lucie JONETTA Kendall 07/23/2024, 12:58 PM

## 2024-07-23 NOTE — Progress Notes (Signed)
 Orthopaedic Trauma Progress Note  SUBJECTIVE: Doing fairly well this morning.  Reports minimal pain about operative site.  Pain has been controlled with Tylenol  as needed.  Patient has not been up out of bed yet since surgery.  Denies any numbness or tingling throughout the right lower extremity.  No chest pain. No SOB. No nausea/vomiting. No other complaints.  About to undergo bedside echocardiogram.  No family at bedside currently.  OBJECTIVE:  Vitals:   07/23/24 0014 07/23/24 0339  BP: 132/76 (!) 122/57  Pulse: 77 79  Resp: 17 17  Temp: 98.1 F (36.7 C) 98 F (36.7 C)  SpO2: 95% 95%    Opiates Today (MME): Today's  total administered Morphine  Milligram Equivalents: 0 Opiates Yesterday (MME): Yesterday's total administered Morphine  Milligram Equivalents: 37.5  General: Sitting up in bed comfortably, no acute distress.  Pleasant and cooperative Respiratory: No increased work of breathing.  Operative Extremity (right lower extremity): Dressing clean, dry, intact.  Some soreness about the knee and distal thigh as expected.  Nontender to the lower leg, ankle, foot.  No calf tenderness.  Ankle DF/PF intact.  Able to wiggle toes.  Endorses sensation to light touch over all aspects of the foot.  Compartments are soft compressible.  Toes are warm and well-perfused.  + DP pulse  IMAGING: Stable post op imaging right femur  LABS:  Results for orders placed or performed during the hospital encounter of 07/21/24 (from the past 24 hours)  Surgical pcr screen     Status: None   Collection Time: 07/22/24  8:48 AM   Specimen: Nasal Mucosa; Nasal Swab  Result Value Ref Range   MRSA, PCR NEGATIVE NEGATIVE   Staphylococcus aureus NEGATIVE NEGATIVE  CBC     Status: Abnormal   Collection Time: 07/23/24  6:22 AM  Result Value Ref Range   WBC 11.7 (H) 4.0 - 10.5 K/uL   RBC 3.39 (L) 3.87 - 5.11 MIL/uL   Hemoglobin 10.9 (L) 12.0 - 15.0 g/dL   HCT 66.7 (L) 63.9 - 53.9 %   MCV 97.9 80.0 - 100.0 fL    MCH 32.2 26.0 - 34.0 pg   MCHC 32.8 30.0 - 36.0 g/dL   RDW 86.0 88.4 - 84.4 %   Platelets 182 150 - 400 K/uL   nRBC 0.0 0.0 - 0.2 %  Basic metabolic panel     Status: Abnormal   Collection Time: 07/23/24  6:22 AM  Result Value Ref Range   Sodium 135 135 - 145 mmol/L   Potassium 4.5 3.5 - 5.1 mmol/L   Chloride 99 98 - 111 mmol/L   CO2 29 22 - 32 mmol/L   Glucose, Bld 161 (H) 70 - 99 mg/dL   BUN 17 8 - 23 mg/dL   Creatinine, Ser 8.93 (H) 0.44 - 1.00 mg/dL   Calcium  9.6 8.9 - 10.3 mg/dL   GFR, Estimated 47 (L) >60 mL/min   Anion gap 7 5 - 15    ASSESSMENT: Leah Holland is a 89 y.o. female, 1 Day Post-Op s/p fall at ALF Procedures: OPEN REDUCTION INTERNAL FIXATION RIGHT INTERPROSTHETIC DISTAL FEMUR FRACTURE  CV/Blood loss: Acute blood loss anemia, Hgb 10.9 this AM.  Lab has been repeated to verify.  Hemodynamically stable  PLAN: Weightbearing: WBAT RLE ROM: Unrestricted ROM Incisional and dressing care: Reinforce dressings as needed  Showering: Okay to begin showering and getting incisions wet starting 07/26/2024 Orthopedic device(s): None  Pain management:  1. Tylenol  1000 mg 3 times daily PRN  2. Robaxin  500 mg q 8 hours PRN 3. Oxycodone  2.5 mg q 6 hours PRN 4. Dilaudid  0.5 mg q 4 hours PRN 5. Neurontin  300 mg 3 times daily VTE prophylaxis: Okay to restart home dose Eliquis  today. SCDs ID:  Ancef  2gm post op Foley/Lines:  No foley, KVO IVFs Impediments to Fracture Healing: Vitamin D  level pending, will start supplementation as indicated.   Dispo: PT/OT evaluation today, anticipate patient will require SNF at discharge prior to return to ALF.  Plan to remove RLE dressing tomorrow 07/24/2024.    D/C recommendations: - Tylenol  as primary pain control.  Will likely plan to send a few low-dose oxycodone  to use only as needed for severe pain - Home dose Eliquis  for DVT prophylaxis - Possible need for Vit D supplementation  Follow - up plan: 2 weeks after d/c for wound check  and repeat x-rays   Contact information:  Franky Light MD, Lauraine Moores PA-C. After hours and holidays please check [email or web address] for group call information for [facility]   Lauraine PATRIC Moores, PA-C [phone] (office) [email or web address]

## 2024-07-24 DIAGNOSIS — S7291XA Unspecified fracture of right femur, initial encounter for closed fracture: Secondary | ICD-10-CM | POA: Diagnosis not present

## 2024-07-24 LAB — BASIC METABOLIC PANEL WITH GFR
Anion gap: 10 (ref 5–15)
BUN: 26 mg/dL — ABNORMAL HIGH (ref 8–23)
CO2: 31 mmol/L (ref 22–32)
Calcium: 9.6 mg/dL (ref 8.9–10.3)
Chloride: 97 mmol/L — ABNORMAL LOW (ref 98–111)
Creatinine, Ser: 1.22 mg/dL — ABNORMAL HIGH (ref 0.44–1.00)
GFR, Estimated: 40 mL/min — ABNORMAL LOW (ref >60)
Glucose, Bld: 123 mg/dL — ABNORMAL HIGH (ref 70–99)
Potassium: 4 mmol/L (ref 3.5–5.1)
Sodium: 138 mmol/L (ref 135–145)

## 2024-07-24 LAB — CBC
HCT: 29.7 % — ABNORMAL LOW (ref 36.0–46.0)
Hemoglobin: 9.8 g/dL — ABNORMAL LOW (ref 12.0–15.0)
MCH: 32.1 pg (ref 26.0–34.0)
MCHC: 33 g/dL (ref 30.0–36.0)
MCV: 97.4 fL (ref 80.0–100.0)
Platelets: 175 K/uL (ref 150–400)
RBC: 3.05 MIL/uL — ABNORMAL LOW (ref 3.87–5.11)
RDW: 14 % (ref 11.5–15.5)
WBC: 13.1 K/uL — ABNORMAL HIGH (ref 4.0–10.5)
nRBC: 0 % (ref 0.0–0.2)

## 2024-07-24 MED ORDER — TEMAZEPAM 7.5 MG PO CAPS
15.0000 mg | ORAL_CAPSULE | Freq: Every evening | ORAL | Status: DC | PRN
  Administered 2024-07-24 (×2): 15 mg via ORAL
  Filled 2024-07-24: qty 2

## 2024-07-24 MED ORDER — SENNOSIDES-DOCUSATE SODIUM 8.6-50 MG PO TABS
1.0000 | ORAL_TABLET | Freq: Two times a day (BID) | ORAL | Status: DC
  Administered 2024-07-24 – 2024-07-25 (×5): 1 via ORAL
  Filled 2024-07-24 (×3): qty 1

## 2024-07-24 NOTE — Progress Notes (Signed)
 Mobility Specialist Progress Note:    07/24/24 1518  Mobility  Activity Pivoted/transferred from chair to bed;Pivoted/transferred to/from [facility]  Level of Assistance Contact guard assist, steadying assist  Assistive Device Front wheel walker  Distance Ambulated (ft) 8 ft  RLE Weight Bearing Per Provider Order WBAT  Activity Response Tolerated well  Mobility Referral Yes  Mobility visit 1 Mobility  Mobility Specialist Start Time (ACUTE ONLY) 1415  Mobility Specialist Stop Time (ACUTE ONLY) 1429  Mobility Specialist Time Calculation (min) (ACUTE ONLY) 14 min   Received pt calling out to transfer back to bed. No physical assistance needed. Requested to use the [facility] prior to ambulating to bed. Pt voided successfully. No c/o. Left pt in bed with alarm on. Personal belongings and call light within reach. All needs met. Son present.  Lavanda Pollack Mobility Specialist  Please contact via [employer] or  Rehab Office [phone]

## 2024-07-24 NOTE — NC FL2 (Signed)
 [place]  MEDICAID FL2 LEVEL OF CARE FORM     IDENTIFICATION  Patient Name: Leah Holland Birthdate: February 13, 1922 Sex: female Admission Date (Current Location): 07/21/2024  [facility] and [place] Number:  Producer, television/film/video and Address:  [facility], 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/2023   Mild cognitive impairment 11/16/2022   Flatulence 11/16/2022   Palliative care encounter 03/08/2022   Stress incontinence 03/08/2022   Endometrial carcinoma (HCC) 02/28/2022   Pelvic mass in female 11/02/2021   Aortic atherosclerosis (HCC) 09/30/2021   Pelvic mass 09/29/2021   Dyspnea on exertion 02/12/2021   Hypophosphatemia 10/07/2020   Hypomagnesemia 10/07/2020   Hypokalemia 10/07/2020   Periprosthetic fracture around internal prosthetic left hip joint (HCC) 10/03/2020   Leg fracture, left, closed, initial encounter 10/01/2020   Hypertensive urgency 10/22/2018   Chronic low back pain 12/21/2017   Chronic antibiotic suppression 06/13/2017   History of hip replacement 06/09/2017   Ecchymosis 02/23/2017   Closed intertrochanteric fracture with nonunion, left 01/26/2017   Leukocytosis 01/26/2017   Fall 01/26/2017   Prosthetic hip infection (HCC) 08/22/2016   Tendinopathy 08/22/2016   Depression, recurrent (HCC) 04/07/2016   Arthritis of knee, degenerative 04/07/2016   OP (osteoporosis) 04/07/2016   Agoraphobia with panic  disorder 04/07/2016   Chronic anticoagulation 08/06/2015   Bipolar disorder (HCC) 08/04/2015   Hyponatremia 08/04/2015   Constipation 08/04/2015   Complication of anesthesia 07/30/2015   Diabetes mellitus type 2, diet-controlled (HCC)    Skin cancer of face    Paroxysmal atrial fibrillation (HCC) 09/01/2014   Right carotid bruit 11/18/2013   Postoperative anemia due to acute blood loss 03/03/2012   Hypertension    Hyperlipidemia    Swelling of extremity    Cataract    Hypothyroidism    GERD (gastroesophageal reflux disease)    Arthritis    Insomnia     Orientation RESPIRATION BLADDER Height & Weight     Self, Time, Situation, Place  Normal Continent Weight: 129 lb 13.6 oz (58.9 kg) Height:  5' 3 (160 cm)  BEHAVIORAL SYMPTOMS/MOOD NEUROLOGICAL BOWEL NUTRITION STATUS      Continent Diet (See DC summary)  AMBULATORY STATUS COMMUNICATION OF NEEDS Skin   Extensive Assist Verbally Other (Comment) (Closed surgical incision on right leg)                       Personal Care Assistance Level of Assistance  Bathing, Dressing, Feeding Bathing Assistance: Maximum assistance Feeding assistance: Limited assistance Dressing Assistance: Maximum assistance     Functional Limitations Info  Sight, Speech, Hearing Sight Info: Impaired Hearing Info: Impaired Speech Info: Adequate    SPECIAL CARE FACTORS FREQUENCY  PT (By licensed PT), OT (By licensed OT)     PT Frequency: 5x/week OT Frequency: 5x/week            Contractures Contractures Info: Not present    Additional Factors  Info  Code Status, Allergies Code Status Info: Full Allergies Info: Levaquin (Levofloxacin); Morphine  and Codeine; Morphine  Sulfate           Current Medications (07/24/2024):  This is the current hospital active medication list Current Facility-Administered Medications  Medication Dose Route Frequency Provider Last Rate Last Admin   acetaminophen  (TYLENOL ) tablet 1,000 mg  1,000 mg Oral TID  PRN Danton Lauraine LABOR, PA-C   1,000 mg at 07/22/24 2147   apixaban  (ELIQUIS ) tablet 2.5 mg  2.5 mg Oral BID Chen, Lydia D, RPH   2.5 mg at 07/24/24 9147   diltiazem  (CARDIZEM  CD) 24 hr capsule 180 mg  180 mg Oral Daily Danton Lauraine LABOR, PA-C   180 mg at 07/24/24 9148   docusate sodium  (COLACE) capsule 100 mg  100 mg Oral BID Danton Lauraine LABOR, PA-C   100 mg at 07/24/24 9147   HYDROmorphone  (DILAUDID ) injection 0.5 mg  0.5 mg Intravenous Q4H PRN Danton Lauraine LABOR, PA-C       levothyroxine  (SYNTHROID ) tablet 112 mcg  112 mcg Oral QAC breakfast Danton Lauraine LABOR, PA-C   112 mcg at 07/24/24 9147   methocarbamol  (ROBAXIN ) tablet 500 mg  500 mg Oral Q8H PRN Danton Lauraine LABOR, PA-C       Or   methocarbamol  (ROBAXIN ) injection 500 mg  500 mg Intravenous Q8H PRN Danton Lauraine LABOR, PA-C       metoCLOPramide  (REGLAN ) tablet 5-10 mg  5-10 mg Oral Q8H PRN Danton Lauraine LABOR, PA-C       Or   metoCLOPramide  (REGLAN ) injection 5-10 mg  5-10 mg Intravenous Q8H PRN Danton Lauraine LABOR, PA-C       metoprolol  tartrate (LOPRESSOR ) tablet 50 mg  50 mg Oral BID Danton Lauraine LABOR, PA-C   50 mg at 07/24/24 9147   ondansetron  (ZOFRAN ) tablet 4 mg  4 mg Oral Q6H PRN Danton Lauraine LABOR, PA-C       Or   ondansetron  (ZOFRAN ) injection 4 mg  4 mg Intravenous Q6H PRN Danton, Sarah A, PA-C       oxyCODONE  (Oxy IR/ROXICODONE ) immediate release tablet 2.5 mg  2.5 mg Oral Q6H PRN Danton Lauraine LABOR, PA-C       pantoprazole  (PROTONIX ) EC tablet 40 mg  40 mg Oral Daily Danton Lauraine A, PA-C   40 mg at 07/24/24 9148   polyethylene glycol (MIRALAX  / GLYCOLAX ) packet 17 g  17 g Oral Daily PRN Danton Lauraine LABOR, PA-C   17 g at 07/24/24 9471   senna-docusate (Senokot-S) tablet 1 tablet  1 tablet Oral BID Danton Lauraine LABOR, PA-C   1 tablet at 07/24/24 9141   sulfamethoxazole -trimethoprim  (BACTRIM ) 400-80 MG per tablet 1 tablet  1 tablet Oral Daily Chen, Lydia D, RPH   1 tablet at 07/24/24 9147     Discharge Medications: Please see discharge summary  for a list of discharge medications.  Relevant Imaging Results:  Relevant Lab Results:   Additional Information SSN: [id number]  Jeoffrey LITTIE Moose, LCSW

## 2024-07-24 NOTE — Discharge Instructions (Signed)
 Orthopaedic Trauma Service Discharge Instructions   General Discharge Instructions  WEIGHT BEARING STATUS: Weightbearing as tolerated right lower extremity  RANGE OF MOTION/ACTIVITY: Okay for hip and knee range of motion as tolerated  Wound Care: You may remove your surgical dressing on Wednesday, 07/24/2024. Incisions can be left open to air if there is no drainage. Once the incision is completely dry and without drainage, it may be left open to air out.  Showering may begin Thursday, 07/25/2024.  Clean incision gently with soap and water .  DVT/PE prophylaxis: Resume your home dose Eliquis   Diet: as you were eating previously.  Can use over the counter stool softeners and bowel preparations, such as Miralax , to help with bowel movements.  Narcotics can be constipating.  Be sure to drink plenty of fluids  PAIN MEDICATION USE AND EXPECTATIONS  You have likely been given narcotic medications to help control your pain.  After a traumatic event that results in an fracture (broken bone) with or without surgery, it is ok to use narcotic pain medications to help control one's pain.  We understand that everyone responds to pain differently and each individual patient will be evaluated on a regular basis for the continued need for narcotic medications. Ideally, narcotic medication use should last no more than 6-8 weeks (coinciding with fracture healing).   As a patient it is your responsibility as well to monitor narcotic medication use and report the amount and frequency you use these medications when you come to your office visit.   We would also advise that if you are using narcotic medications, you should take a dose prior to therapy to maximize you participation.  IF YOU ARE ON NARCOTIC MEDICATIONS IT IS NOT PERMISSIBLE TO OPERATE A MOTOR VEHICLE (MOTORCYCLE/CAR/TRUCK/MOPED) OR HEAVY MACHINERY DO NOT MIX NARCOTICS WITH OTHER CNS (CENTRAL NERVOUS SYSTEM) DEPRESSANTS SUCH AS  ALCOHOL  POST-OPERATIVE OPIOID TAPER INSTRUCTIONS: It is important to wean off of your opioid medication as soon as possible. If you do not need pain medication after your surgery it is ok to stop day one. Opioids include: Codeine, Hydrocodone (Norco, Vicodin), Oxycodone (Percocet, oxycontin ) and hydromorphone  amongst others.  Long term and even short term use of opiods can cause: Increased pain response Dependence Constipation Depression Respiratory depression And more.  Withdrawal symptoms can include Flu like symptoms Nausea, vomiting And more Techniques to manage these symptoms Hydrate well Eat regular healthy meals Stay active Use relaxation techniques(deep breathing, meditating, yoga) Do Not substitute Alcohol to help with tapering If you have been on opioids for less than two weeks and do not have pain than it is ok to stop all together.  Plan to wean off of opioids This plan should start within one week post op of your fracture surgery  Maintain the same interval or time between taking each dose and first decrease the dose.  Cut the total daily intake of opioids by one tablet each day Next start to increase the time between doses. The last dose that should be eliminated is the evening dose.    STOP SMOKING OR USING NICOTINE PRODUCTS!!!!  As discussed nicotine severely impairs your body's ability to heal surgical and traumatic wounds but also impairs bone healing.  Wounds and bone heal by forming microscopic blood vessels (angiogenesis) and nicotine is a vasoconstrictor (essentially, shrinks blood vessels).  Therefore, if vasoconstriction occurs to these microscopic blood vessels they essentially disappear and are unable to deliver necessary nutrients to the healing tissue.  This is one modifiable factor that  you can do to dramatically increase your chances of healing your injury.  (This means no smoking, no nicotine gum, patches, etc)  DO NOT USE NONSTEROIDAL  ANTI-INFLAMMATORY DRUGS (NSAID'S)  Using products such as Advil  (ibuprofen ), Aleve (naproxen), Motrin  (ibuprofen ) for additional pain control during fracture healing can delay and/or prevent the healing response.  If you would like to take over the counter (OTC) medication, Tylenol  (acetaminophen ) is ok.  However, some narcotic medications that are given for pain control contain acetaminophen  as well. Therefore, you should not exceed more than 4000 mg of tylenol  in a day if you do not have liver disease.  Also note that there are may OTC medicines, such as cold medicines and allergy medicines that my contain tylenol  as well.  If you have any questions about medications and/or interactions please ask your doctor/PA or your pharmacist.      ICE AND ELEVATE INJURED/OPERATIVE EXTREMITY  Using ice and elevating the injured extremity above your heart can help with swelling and pain control.  Icing in a pulsatile fashion, such as 20 minutes on and 20 minutes off, can be followed.    Do not place ice directly on skin. Make sure there is a barrier between to skin and the ice pack.    Using frozen items such as frozen peas works well as the conform nicely to the are that needs to be iced.  USE AN ACE WRAP OR TED HOSE FOR SWELLING CONTROL  In addition to icing and elevation, Ace wraps or TED hose are used to help limit and resolve swelling.  It is recommended to use Ace wraps or TED hose until you are informed to stop.    When using Ace Wraps start the wrapping distally (farthest away from the body) and wrap proximally (closer to the body)   Example: If you had surgery on your leg or thing and you do not have a splint on, start the ace wrap at the toes and work your way up to the thigh        If you had surgery on your upper extremity and do not have a splint on, start the ace wrap at your fingers and work your way up to the upper arm   CALL THE OFFICE FOR MEDICATION REFILLS OR WITH ANY QUESTIONS/CONCERNS:  [phone]   VISIT OUR WEBSITE FOR ADDITIONAL INFORMATION: [email or web address]   Discharge Wound Care Instructions  Do NOT apply any ointments, solutions or lotions to surgical wounds.  These prevent needed drainage and even though solutions like hydrogen peroxide kill bacteria, they also damage cells that help fight infection.  Applying lotions or ointments can keep the wounds moist and can cause them to breakdown and open up as well. This can increase the risk for infection. When in doubt call the office.  Surgical incisions should be dressed daily.  If any drainage is noted, use one layer of adaptic or Mepitel, then gauze, Kerlix, and an ace wrap. - These dressing supplies should be available at local medical supply stores ([facility], [facility], etc) as well as Insurance claims handler ([facility], [facility], [place], etc)  Once the incision is completely dry and without drainage, it may be left open to air out.  Showering may begin 36-48 hours later.  Cleaning gently with soap and water .   Call office for the following: Temperature greater than 101F Persistent nausea and vomiting Severe uncontrolled pain Redness, tenderness, or signs of infection (pain, swelling, redness, odor or green/yellow discharge around the site)  Difficulty breathing, headache or visual disturbances Hives Persistent dizziness or light-headedness Extreme fatigue Any other questions or concerns you may have after discharge  In an emergency, call 911 or go to an Emergency Department at a nearby hospital  OTHER HELPFUL INFORMATION  If you had a block, it will wear off between 8-24 hrs postop typically.  This is period when your pain may go from nearly zero to the pain you would have had postop without the block.  This is an abrupt transition but nothing dangerous is happening.  You may take an extra dose of narcotic when this happens.  You should wean off your narcotic medicines as soon as you are able.  Most  patients will be off or using minimal narcotics before their first postop appointment.   We suggest you use the pain medication the first night prior to going to bed, in order to ease any pain when the anesthesia wears off. You should avoid taking pain medications on an empty stomach as it will make you nauseous.  Do not drink alcoholic beverages or take illicit drugs when taking pain medications.  In most states it is against the law to drive while you are in a splint or sling.  And certainly against the law to drive while taking narcotics.  You may return to work/school in the next couple of days when you feel up to it.   Pain medication may make you constipated.  Below are a few solutions to try in this order: Decrease the amount of pain medication if you aren't having pain. Drink lots of decaffeinated fluids. Drink prune juice and/or each dried prunes  If the first 3 don't work start with additional solutions Take Colace - an over-the-counter stool softener Take Senokot - an over-the-counter laxative Take Miralax  - a stronger over-the-counter laxative

## 2024-07-24 NOTE — Progress Notes (Addendum)
 Orthopaedic Trauma Progress Note  SUBJECTIVE: Doing fairly well this morning.  Reports minimal pain about operative site.  Pain has been controlled with Tylenol  as needed.  Denies any numbness or tingling throughout the right lower extremity. No BM since admission. Does have issues with constipation at baseline. Was given Miralax  this AM. Uses Senna at home. I will add this to regiemn. No chest pain. No SOB. No nausea/vomiting. No other complaints.  No family at bedside currently.  Patient states therapies went well yesterday.   Prior to admission, patient was living in an independent/assisted living facility.  She understands she may need a skilled nursing facility at discharge.  She is agreeable to this.  OBJECTIVE:  Vitals:   07/23/24 2100 07/24/24 0458  BP: 132/72 (!) 155/75  Pulse: 93 67  Resp: 18 18  Temp: 98.2 F (36.8 C) 97.8 F (36.6 C)  SpO2: 98% 94%    Opiates Today (MME): Today's  total administered Morphine  Milligram Equivalents: 0 Opiates Yesterday (MME): Yesterday's total administered Morphine  Milligram Equivalents: 0  General: Sitting up in bed comfortably, no acute distress.  Pleasant and cooperative Respiratory: No increased work of breathing.  Operative Extremity (right lower extremity): Ace wrap and cast padding removed.  Mepilex dressings clean, dry, intact.  Some soreness about the knee and distal thigh as expected.  Nontender to the lower leg, ankle, foot.  No calf tenderness.  Ankle DF/PF intact.  Patient able to straight leg raise.  Able to wiggle toes.  Endorses sensation to light touch over all aspects of the foot.  Compartments are soft compressible.  Toes are warm and well-perfused.  + DP pulse  IMAGING: Stable post op imaging right femur  LABS:  Results for orders placed or performed during the hospital encounter of 07/21/24 (from the past 24 hours)  CBC     Status: Abnormal   Collection Time: 07/23/24  9:28 AM  Result Value Ref Range   WBC 16.4 (H) 4.0 -  10.5 K/uL   RBC 3.52 (L) 3.87 - 5.11 MIL/uL   Hemoglobin 11.3 (L) 12.0 - 15.0 g/dL   HCT 64.8 (L) 63.9 - 53.9 %   MCV 99.7 80.0 - 100.0 fL   MCH 32.1 26.0 - 34.0 pg   MCHC 32.2 30.0 - 36.0 g/dL   RDW 86.2 88.4 - 84.4 %   Platelets 191 150 - 400 K/uL   nRBC 0.0 0.0 - 0.2 %  VITAMIN D  25 Hydroxy (Vit-D Deficiency, Fractures)     Status: None   Collection Time: 07/23/24  9:28 AM  Result Value Ref Range   Vit D, 25-Hydroxy 70.72 30 - 100 ng/mL  CBC     Status: Abnormal   Collection Time: 07/24/24  2:41 AM  Result Value Ref Range   WBC 13.1 (H) 4.0 - 10.5 K/uL   RBC 3.05 (L) 3.87 - 5.11 MIL/uL   Hemoglobin 9.8 (L) 12.0 - 15.0 g/dL   HCT 70.2 (L) 63.9 - 53.9 %   MCV 97.4 80.0 - 100.0 fL   MCH 32.1 26.0 - 34.0 pg   MCHC 33.0 30.0 - 36.0 g/dL   RDW 85.9 88.4 - 84.4 %   Platelets 175 150 - 400 K/uL   nRBC 0.0 0.0 - 0.2 %  Basic metabolic panel     Status: Abnormal   Collection Time: 07/24/24  2:41 AM  Result Value Ref Range   Sodium 138 135 - 145 mmol/L   Potassium 4.0 3.5 - 5.1 mmol/L  Chloride 97 (L) 98 - 111 mmol/L   CO2 31 22 - 32 mmol/L   Glucose, Bld 123 (H) 70 - 99 mg/dL   BUN 26 (H) 8 - 23 mg/dL   Creatinine, Ser 8.77 (H) 0.44 - 1.00 mg/dL   Calcium  9.6 8.9 - 10.3 mg/dL   GFR, Estimated 40 (L) >60 mL/min   Anion gap 10 5 - 15    ASSESSMENT: Leah Holland is a 89 y.o. female, 2 Days Post-Op s/p fall at ALF Procedures: OPEN REDUCTION INTERNAL FIXATION RIGHT INTERPROSTHETIC DISTAL FEMUR FRACTURE  CV/Blood loss: Acute blood loss anemia, Hgb continues to trend down over last few days 16--> 9.8 this AM.  Lab has been repeated to verify.  Hemodynamically stable  PLAN: Weightbearing: WBAT RLE ROM: Unrestricted ROM Incisional and dressing care: Change Mepilex dressings as needed.  Okay to leave incisions open to air if no drainage Showering: Okay to begin showering and getting incisions wet starting 07/25/2024 Orthopedic device(s): None  Pain management:  1. Tylenol   1000 mg 3 times daily PRN  2. Robaxin  500 mg q 8 hours PRN 3. Oxycodone  2.5 mg q 6 hours PRN 4. Dilaudid  0.5 mg q 4 hours PRN 5. Neurontin  300 mg 3 times daily VTE prophylaxis: Home dose Eliquis .  Continue SCDs ID:  Ancef  2gm post op completed Foley/Lines:  No foley, KVO IVFs Impediments to Fracture Healing: Vitamin D  level 70. No additional supplementation needed Dispo: PT/OT evaluation ongoing, recommending SNF at discharge.  Patient agreeable. Recheck CBC tomorrow.  Patient okay for discharge from orthopedic standpoint once cleared by medicine team and therapies  D/C recommendations: - Tylenol  as primary pain control. Do not plan to send narcotic rx as patient has not needed this on inpatient basis. - Home dose Eliquis  for DVT prophylaxis - No additional need for Vit D supplementation  Follow - up plan: 2 weeks after d/c for wound check and repeat x-rays   Contact information:  Franky Light MD, Leah Moores PA-C. After hours and holidays please check [email or web address] for group call information for [facility]   Leah PATRIC Moores, PA-C [phone] (office) [email or web address]

## 2024-07-24 NOTE — Plan of Care (Signed)

## 2024-07-24 NOTE — Progress Notes (Signed)
 Physical Therapy Treatment Patient Details Name: Leah Holland MRN: [id number] DOB: Sep 03, 1922 Today's Date: 07/24/2024   History of Present Illness Pt is a 89 y/o female who presents 07/21/2024 after a mechanical fall, sustaining a R femur fracture. She is now s/p ORIF of R distal femur fx on 8/11. PMH significant for a-fib, HTN, and known multiple orthopedic issues.    PT Comments  Pt progressing towards physical therapy goals. Was able to perform transfers and ambulation with gross min assist - +2 helpful for chair follow and safety throughout mobility. Pt motivated to participate. Follow up PT recommendations remain appropriate at this time. Will continue to follow and progress as able per POC.     If plan is discharge home, recommend the following: A lot of help with walking and/or transfers;A lot of help with bathing/dressing/bathroom;Assistance with cooking/housework;Assist for transportation;Help with stairs or ramp for entrance   Can travel by private vehicle     Yes  Equipment Recommendations  None recommended by PT    Recommendations for Other Services       Precautions / Restrictions Precautions Precautions: Fall Recall of Precautions/Restrictions: Intact Restrictions Weight Bearing Restrictions Per Provider Order: Yes RLE Weight Bearing Per Provider Order: Weight bearing as tolerated     Mobility  Bed Mobility Overal bed mobility: Needs Assistance Bed Mobility: Supine to Sit     Supine to sit: Min assist     General bed mobility comments: Intermittent assist for RLE movement and trunk support as pt transitioned around to EOB.    Transfers Overall transfer level: Needs assistance Equipment used: Rolling walker (2 wheels) Transfers: Sit to/from Stand, Bed to chair/wheelchair/BSC Sit to Stand: Min assist, +2 physical assistance, +2 safety/equipment   Step pivot transfers: Min assist, +2 physical assistance, +2 safety/equipment       General transfer  comment: VC's for sequencing and safety. Pt stepped from bed>BSC to the L. Assist for balance support and walker management.    Ambulation/Gait Ambulation/Gait assistance: +2 safety/equipment Gait Distance (Feet): 50 Feet Assistive device: Rolling walker (2 wheels) Gait Pattern/deviations: Step-through pattern, Decreased stride length, Step-to pattern, Knee flexed in stance - right, Trunk flexed, Narrow base of support Gait velocity: Decreased Gait velocity interpretation: <1.31 ft/sec, indicative of household ambulator   General Gait Details: Slow and guarded. Pt was able to weight bear through surgical side with minimal complaints of pain. No overt LOB noted but pt unsteady requiring assist at times. Chair follow utilized.   Stairs             Wheelchair Mobility     Tilt Bed    Modified Rankin (Stroke Patients Only)       Balance Overall balance assessment: Needs assistance Sitting-balance support: Feet supported Sitting balance-Leahy Scale: Fair     Standing balance support: Bilateral upper extremity supported, During functional activity Standing balance-Leahy Scale: Poor                              Communication Communication Communication: Impaired Factors Affecting Communication: Hearing impaired  Cognition Arousal: Alert Behavior During Therapy: WFL for tasks assessed/performed   PT - Cognitive impairments: No apparent impairments                         Following commands: Intact      Cueing Cueing Techniques: Verbal cues, Gestural cues  Exercises      General Comments  Pertinent Vitals/Pain Pain Assessment Pain Assessment: Faces Faces Pain Scale: Hurts a little bit Pain Location: RLE Pain Descriptors / Indicators: Sore Pain Intervention(s): Limited activity within patient's tolerance, Monitored during session, Repositioned    Home Living                          Prior Function            PT  Goals (current goals can now be found in the care plan section) Acute Rehab PT Goals Patient Stated Goal: Be able to return to independent living PT Goal Formulation: With patient Time For Goal Achievement: 08/06/24 Potential to Achieve Goals: Good Progress towards PT goals: Progressing toward goals    Frequency    Min 2X/week      PT Plan      Co-evaluation              AM-PAC PT 6 Clicks Mobility   Outcome Measure  Help needed turning from your back to your side while in a flat bed without using bedrails?: A Little Help needed moving from lying on your back to sitting on the side of a flat bed without using bedrails?: A Little Help needed moving to and from a bed to a chair (including a wheelchair)?: A Little Help needed standing up from a chair using your arms (e.g., wheelchair or bedside chair)?: A Little Help needed to walk in hospital room?: A Little Help needed climbing 3-5 steps with a railing? : A Lot 6 Click Score: 17    End of Session Equipment Utilized During Treatment: Gait belt Activity Tolerance: Patient tolerated treatment well Patient left: in chair;with call bell/phone within reach;with chair alarm set Nurse Communication: Mobility status PT Visit Diagnosis: Unsteadiness on feet (R26.81);Pain Pain - Right/Left: Right Pain - part of body: Hip;Knee     Time: 1131-1157 PT Time Calculation (min) (ACUTE ONLY): 26 min  Charges:    $Gait Training: 23-37 mins PT General Charges $$ ACUTE PT VISIT: 1 Visit                     Leita Sable, PT, DPT Acute Rehabilitation Services Secure Chat Preferred Office: [phone]    Leita JONETTA Sable 07/24/2024, 1:15 PM

## 2024-07-24 NOTE — TOC Progression Note (Addendum)
 Transition of Care [facility]) - Progression Note    Patient Details  Name: Leah Holland MRN: [id number] Date of Birth: November 09, 1922  [facility] CM/SW Contact  Nena Hampe LITTIE Moose, LCSW Phone Number: 07/24/2024, 9:58 AM  Clinical Narrative:    CSW spoke with Leah Holland from [facility] to inform her that pt would need SNF. Leah Holland said they have a semi private SNF bed available when pt is medically ready for DC and she would update the family. Pt will need 3 midnight stay before being able to go back to facility for SNF, tonight will be the third night. CSW completed new Fl2 and sent to facility. CSW will continue to follow.                      Expected Discharge Plan and Services                                               Social Drivers of Health (SDOH) Interventions SDOH Screenings   Food Insecurity: No Food Insecurity (07/21/2024)  Housing: Low Risk  (07/21/2024)  Transportation Needs: No Transportation Needs (07/21/2024)  Utilities: Not At Risk (07/21/2024)  Depression (PHQ2-9): Low Risk  (02/28/2022)  Social Connections: Unknown (07/21/2024)  Tobacco Use: Low Risk  (07/22/2024)    Readmission Risk Interventions     No data to display

## 2024-07-24 NOTE — Care Management Important Message (Signed)
 Important Message  Patient Details  Name: Leah Holland MRN: [id number] Date of Birth: 06/18/1922   Important Message Given:  Yes - Medicare IM     Jon Cruel 07/24/2024, 3:44 PM

## 2024-07-24 NOTE — Progress Notes (Signed)
 Physical Therapy Treatment Patient Details Name: Leah Holland MRN: [id number] DOB: Sep 03, 1922 Today's Date: 07/24/2024   History of Present Illness Pt is a 89 y/o female who presents 07/21/2024 after a mechanical fall, sustaining a R femur fracture. She is now s/p ORIF of R distal femur fx on 8/11. PMH significant for a-fib, HTN, and known multiple orthopedic issues.    PT Comments  Pt progressing towards physical therapy goals. Was able to perform transfers and ambulation with gross min assist - +2 helpful for chair follow and safety throughout mobility. Pt motivated to participate. Follow up PT recommendations remain appropriate at this time. Will continue to follow and progress as able per POC.     If plan is discharge home, recommend the following: A lot of help with walking and/or transfers;A lot of help with bathing/dressing/bathroom;Assistance with cooking/housework;Assist for transportation;Help with stairs or ramp for entrance   Can travel by private vehicle     Yes  Equipment Recommendations  None recommended by PT    Recommendations for Other Services       Precautions / Restrictions Precautions Precautions: Fall Recall of Precautions/Restrictions: Intact Restrictions Weight Bearing Restrictions Per Provider Order: Yes RLE Weight Bearing Per Provider Order: Weight bearing as tolerated     Mobility  Bed Mobility Overal bed mobility: Needs Assistance Bed Mobility: Supine to Sit     Supine to sit: Min assist     General bed mobility comments: Intermittent assist for RLE movement and trunk support as pt transitioned around to EOB.    Transfers Overall transfer level: Needs assistance Equipment used: Rolling walker (2 wheels) Transfers: Sit to/from Stand, Bed to chair/wheelchair/BSC Sit to Stand: Min assist, +2 physical assistance, +2 safety/equipment   Step pivot transfers: Min assist, +2 physical assistance, +2 safety/equipment       General transfer  comment: VC's for sequencing and safety. Pt stepped from bed>BSC to the L. Assist for balance support and walker management.    Ambulation/Gait Ambulation/Gait assistance: +2 safety/equipment Gait Distance (Feet): 50 Feet Assistive device: Rolling walker (2 wheels) Gait Pattern/deviations: Step-through pattern, Decreased stride length, Step-to pattern, Knee flexed in stance - right, Trunk flexed, Narrow base of support Gait velocity: Decreased Gait velocity interpretation: <1.31 ft/sec, indicative of household ambulator   General Gait Details: Slow and guarded. Pt was able to weight bear through surgical side with minimal complaints of pain. No overt LOB noted but pt unsteady requiring assist at times. Chair follow utilized.   Stairs             Wheelchair Mobility     Tilt Bed    Modified Rankin (Stroke Patients Only)       Balance Overall balance assessment: Needs assistance Sitting-balance support: Feet supported Sitting balance-Leahy Scale: Fair     Standing balance support: Bilateral upper extremity supported, During functional activity Standing balance-Leahy Scale: Poor                              Communication Communication Communication: Impaired Factors Affecting Communication: Hearing impaired  Cognition Arousal: Alert Behavior During Therapy: WFL for tasks assessed/performed   PT - Cognitive impairments: No apparent impairments                         Following commands: Intact      Cueing Cueing Techniques: Verbal cues, Gestural cues  Exercises      General Comments  Pertinent Vitals/Pain Pain Assessment Pain Assessment: Faces Faces Pain Scale: Hurts a little bit Pain Location: RLE Pain Descriptors / Indicators: Sore Pain Intervention(s): Limited activity within patient's tolerance, Monitored during session, Repositioned    Home Living                          Prior Function            PT  Goals (current goals can now be found in the care plan section) Acute Rehab PT Goals Patient Stated Goal: Be able to return to independent living PT Goal Formulation: With patient Time For Goal Achievement: 08/06/24 Potential to Achieve Goals: Good Progress towards PT goals: Progressing toward goals    Frequency    Min 2X/week      PT Plan      Co-evaluation              AM-PAC PT 6 Clicks Mobility   Outcome Measure  Help needed turning from your back to your side while in a flat bed without using bedrails?: A Little Help needed moving from lying on your back to sitting on the side of a flat bed without using bedrails?: A Little Help needed moving to and from a bed to a chair (including a wheelchair)?: A Little Help needed standing up from a chair using your arms (e.g., wheelchair or bedside chair)?: A Little Help needed to walk in hospital room?: A Little Help needed climbing 3-5 steps with a railing? : A Lot 6 Click Score: 17    End of Session Equipment Utilized During Treatment: Gait belt Activity Tolerance: Patient tolerated treatment well Patient left: in chair;with call bell/phone within reach;with chair alarm set Nurse Communication: Mobility status PT Visit Diagnosis: Unsteadiness on feet (R26.81);Pain Pain - Right/Left: Right Pain - part of body: Hip;Knee     Time: 1131-1157 PT Time Calculation (min) (ACUTE ONLY): 26 min  Charges:    $Gait Training: 23-37 mins PT General Charges $$ ACUTE PT VISIT: 1 Visit                     Leah Holland, PT, DPT Acute Rehabilitation Services Secure Chat Preferred Office: [phone]    Leah Holland 07/24/2024, 1:15 PM

## 2024-07-24 NOTE — Progress Notes (Signed)
 [facility]  PROGRESS NOTE  Leah Holland FMW:[id number] DOB: [birth year] DOA: 07/21/2024 PCP: [name], MD (Inactive)   Brief HPI:     89 y.o. 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 Appears to be a mechanical fall.  [facility]   Assessment/Plan:   RLE distal femur fracture 2/2 GLF -Orthopedic surgery consulted-underwent ORIF on 8/11  -Multimodal pain control w/ Tylenol  1g TID, gabapentin  300mg  TID, and oxycodone  5mg  q4h prn -Appears to be mechanical fall versus syncope-EEG within normal limits, echo done with preserved EF but some volume overload- IV lasix  x 1 ? SNF [facility]   Atrial fibrillation - On Eliquis   - Continue rate controlling medications   Hypothyroidism - Continue Synthroid    Hypokalemia -replete   Anemia -recheck and trend -some expected post op blood loss   history of total knee replacement as well as right total hip replacement. - positive culture on her total hip replacement and they have been treating that on the right side with suppressive antibiotics with infectious disease. She is been on Bactrim  for years -- will resume  CKD stage IIIb - Creatinine 1.22 at baseline   Medications     apixaban   2.5 mg Oral BID   diltiazem   180 mg Oral Daily   docusate sodium   100 mg Oral BID   levothyroxine   112 mcg Oral QAC breakfast   metoprolol  tartrate  50 mg Oral BID   pantoprazole   40 mg Oral Daily   senna-docusate  1 tablet Oral BID   sulfamethoxazole -trimethoprim   1 tablet Oral Daily     Data Reviewed:   CBG:  No results for input(s): GLUCAP in the last 168 hours.  SpO2: 94 % O2 Flow Rate (L/min): 2 L/min FiO2 (%): (!) 2 %    Vitals:    07/23/24 1627 07/23/24 2100 07/24/24 0458 07/24/24 0851  BP: 124/63 132/72 (!) 155/75 (!) 155/75  Pulse: (!) 59 93 67 67  Resp:  18 18   Temp: 98.1 F (36.7 C) 98.2 F (36.8 C) 97.8 F (36.6 C)   TempSrc: Oral Oral Oral   SpO2: 96% 98% 94%   Weight:      Height:          Data Reviewed:  Basic Metabolic Panel: Recent Labs  Lab 07/21/24 0849 07/22/24 0736 07/23/24 0622 07/24/24 0241  NA 138 137 135 138  K 3.5 3.3* 4.5 4.0  CL 99 100 99 97*  CO2 27 27 29 31   GLUCOSE 150* 128* 161* 123*  BUN 20 10 17  26*  CREATININE 1.20* 0.83 1.06* 1.22*  CALCIUM  10.9* 9.7 9.6 9.6    CBC: Recent Labs  Lab 07/21/24 0849 07/23/24 0622 07/23/24 0928 07/24/24 0241  WBC 9.1 11.7* 16.4* 13.1*  NEUTROABS 6.5  --   --   --   HGB 16.0* 10.9* 11.3* 9.8*  HCT 48.6* 33.2* 35.1* 29.7*  MCV 95.9 97.9 99.7 97.4  PLT 265 182 191 175    LFT Recent Labs  Lab 07/21/24 0849  AST 22  ALT 20  ALKPHOS 105  BILITOT 0.9  PROT 7.2  ALBUMIN  3.9     Antibiotics: Anti-infectives (From admission, onward)    Start     Dose/Rate Route Frequency Ordered Stop  07/24/24 1000  sulfamethoxazole -trimethoprim  (BACTRIM ) 400-80 MG per tablet 1 tablet        1 tablet Oral Daily 07/23/24 1056     07/22/24 2300  ceFAZolin  (ANCEF ) IVPB 2g/100 mL premix        2 g 200 mL/hr over 30 Minutes Intravenous Every 12 hours 07/22/24 1308 07/23/24 1030   07/22/24 1400  sulfamethoxazole -trimethoprim  (BACTRIM ) 400-80 MG per tablet 1 tablet  Status:  Discontinued        1 tablet Oral 2 times daily 07/22/24 1029 07/23/24 1056   07/22/24 1115  vancomycin  (VANCOCIN ) powder  Status:  Discontinued          As needed 07/22/24 1115 07/22/24 1151   07/22/24 0930  ceFAZolin  (ANCEF ) IVPB 2g/100 mL premix        2 g 200 mL/hr over 30 Minutes Intravenous  Once 07/22/24 0922 07/22/24 1101        DVT prophylaxis: SCDs  Code Status: Full code  Family Communication: No family at bedside   CONSULTS  orthopedics   Subjective   Denies pain   Objective    Physical Examination:   General-appears in no acute distress Heart-S1-S2, regular, no murmur auscultated Lungs-clear to auscultation bilaterally, no wheezing or crackles auscultated Abdomen-soft, nontender, no organomegaly Extremities-no edema in the lower extremities Neuro-alert, oriented x3, no focal deficit noted   Status is: Inpatient:             Leah Holland Brod   [facility] If 7PM-7AM, please contact night-coverage at [email or web address], Office  [phone]   07/24/2024, 8:56 AM  LOS: 3 days

## 2024-07-25 DIAGNOSIS — S7291XA Unspecified fracture of right femur, initial encounter for closed fracture: Secondary | ICD-10-CM | POA: Diagnosis not present

## 2024-07-25 LAB — CBC
HCT: 33.9 % — ABNORMAL LOW (ref 36.0–46.0)
Hemoglobin: 11.1 g/dL — ABNORMAL LOW (ref 12.0–15.0)
MCH: 31.8 pg (ref 26.0–34.0)
MCHC: 32.7 g/dL (ref 30.0–36.0)
MCV: 97.1 fL (ref 80.0–100.0)
Platelets: 221 K/uL (ref 150–400)
RBC: 3.49 MIL/uL — ABNORMAL LOW (ref 3.87–5.11)
RDW: 14 % (ref 11.5–15.5)
WBC: 10.6 K/uL — ABNORMAL HIGH (ref 4.0–10.5)
nRBC: 0 % (ref 0.0–0.2)

## 2024-07-25 LAB — COMPREHENSIVE METABOLIC PANEL WITH GFR
ALT: 11 U/L (ref 0–44)
AST: 22 U/L (ref 15–41)
Albumin: 3.1 g/dL — ABNORMAL LOW (ref 3.5–5.0)
Alkaline Phosphatase: 77 U/L (ref 38–126)
Anion gap: 8 (ref 5–15)
BUN: 22 mg/dL (ref 8–23)
CO2: 32 mmol/L (ref 22–32)
Calcium: 10.2 mg/dL (ref 8.9–10.3)
Chloride: 98 mmol/L (ref 98–111)
Creatinine, Ser: 1.07 mg/dL — ABNORMAL HIGH (ref 0.44–1.00)
GFR, Estimated: 47 mL/min — ABNORMAL LOW (ref >60)
Glucose, Bld: 104 mg/dL — ABNORMAL HIGH (ref 70–99)
Potassium: 3.8 mmol/L (ref 3.5–5.1)
Sodium: 138 mmol/L (ref 135–145)
Total Bilirubin: 1.5 mg/dL — ABNORMAL HIGH (ref 0.0–1.2)
Total Protein: 5.8 g/dL — ABNORMAL LOW (ref 6.5–8.1)

## 2024-07-25 MED ORDER — OXYCODONE HCL 5 MG PO TABS: 2.5000 mg | ORAL_TABLET | Freq: Four times a day (QID) | ORAL | 0 refills | Status: DC | PRN

## 2024-07-25 MED ORDER — POLYETHYLENE GLYCOL 3350 17 G PO PACK
17.0000 g | PACK | Freq: Every day | ORAL | Status: AC | PRN
Start: 2024-07-25 — End: ?

## 2024-07-25 MED ORDER — TEMAZEPAM 30 MG PO CAPS: 30.0000 mg | ORAL_CAPSULE | Freq: Every evening | ORAL | 0 refills | Status: DC | PRN

## 2024-07-25 NOTE — TOC Initial Note (Signed)
 Transition of Care [facility]) - Initial/Assessment Note    Patient Details  Name: Leah Holland MRN: [id number] Date of Birth: Apr 18, 1922  Transition of Care [facility]) CM/SW Contact:    Jeoffrey LITTIE Moose, LCSW Phone Number: 07/25/2024, 1:37 PM  Clinical Narrative:                 Patient will DC to: [facility]  Anticipated DC date: 07/25/24 Family notified: Yes Transport by: [facility]   Per MD patient ready for DC to [facility]. RN to call report prior to discharge [phone] Room 60B. RN, patient, patient's family, and facility notified of DC. Discharge Summary and FL2 sent to facility. DC packet on chart. Ambulance transport requested for patient.   CSW will sign off for now as social work intervention is no longer needed. Please consult us  again if new needs arise.      Barriers to Discharge: Barriers Resolved   Patient Goals and CMS Choice Patient states their goals for this hospitalization and ongoing recovery are:: return to [facility]          Expected Discharge Plan and Services         Expected Discharge Date: 07/25/24                                    Prior Living Arrangements/Services                       Activities of Daily Living   ADL Screening (condition at time of admission) Independently performs ADLs?: Yes (appropriate for developmental age) Is the patient deaf or have difficulty hearing?: Yes Does the patient have difficulty seeing, even when wearing glasses/contacts?: No Does the patient have difficulty concentrating, remembering, or making decisions?: No  Permission Sought/Granted                  Emotional Assessment              Admission diagnosis:  Femur fracture, right (HCC) [S72.91XA] Closed fracture of right femur, unspecified fracture morphology, initial encounter (HCC) [S72.91XA] Patient Active Problem List   Diagnosis Date Noted   Femur fracture, right (HCC) 07/21/2024    Grieving 05/03/2023   Complicated grief 05/03/2023   Prosthetic joint infection (HCC) 05/02/2023   Mild cognitive impairment 11/16/2022   Flatulence 11/16/2022   Palliative care encounter 03/08/2022   Stress incontinence 03/08/2022   Endometrial carcinoma (HCC) 02/28/2022   Pelvic mass in female 11/02/2021   Aortic atherosclerosis (HCC) 09/30/2021   Pelvic mass 09/29/2021   Dyspnea on exertion 02/12/2021   Hypophosphatemia 10/07/2020   Hypomagnesemia 10/07/2020   Hypokalemia 10/07/2020   Periprosthetic fracture around internal prosthetic left hip joint (HCC) 10/03/2020   Leg fracture, left, closed, initial encounter 10/01/2020   Hypertensive urgency 10/22/2018   Chronic low back pain 12/21/2017   Chronic antibiotic suppression 06/13/2017   History of hip replacement 06/09/2017   Ecchymosis 02/23/2017   Closed intertrochanteric fracture with nonunion, left 01/26/2017   Leukocytosis 01/26/2017   Fall 01/26/2017   Prosthetic hip infection (HCC) 08/22/2016   Tendinopathy 08/22/2016   Depression, recurrent (HCC) 04/07/2016   Arthritis of knee, degenerative 04/07/2016   OP (osteoporosis) 04/07/2016   Agoraphobia with panic disorder 04/07/2016   Chronic anticoagulation 08/06/2015   Bipolar disorder (HCC) 08/04/2015   Hyponatremia 08/04/2015   Constipation 08/04/2015   Complication of anesthesia 07/30/2015  Diabetes mellitus type 2, diet-controlled (HCC)    Skin cancer of face    Paroxysmal atrial fibrillation (HCC) 09/01/2014   Right carotid bruit 11/18/2013   Postoperative anemia due to acute blood loss 03/03/2012   Hypertension    Hyperlipidemia    Swelling of extremity    Cataract    Hypothyroidism    GERD (gastroesophageal reflux disease)    Arthritis    Insomnia    PCP:  [name], MD (Inactive) Pharmacy:   [facility] [place], [place] - [address] [place] [address] Phone: [phone] Fax:  [phone]  [facility] [place], [place] - [address] [place] [address] Phone: [phone] Fax: [phone]     Social Drivers of Health (SDOH) Social History: SDOH Screenings   Food Insecurity: No Food Insecurity (07/21/2024)  Housing: Low Risk  (07/21/2024)  Transportation Needs: No Transportation Needs (07/21/2024)  Utilities: Not At Risk (07/21/2024)  Depression (PHQ2-9): Low Risk  (02/28/2022)  Social Connections: Unknown (07/21/2024)  Tobacco Use: Low Risk  (07/22/2024)   SDOH Interventions:     Readmission Risk Interventions     No data to display

## 2024-07-25 NOTE — Progress Notes (Signed)
 Called [facility] at [phone] gave report to Elsie, Charity fundraiser. Provided direct number for additional questions or concerns.

## 2024-07-25 NOTE — Plan of Care (Signed)

## 2024-07-25 NOTE — Progress Notes (Signed)
 Physical Therapy Treatment  Patient Details Name: Leah Holland MRN: [id number] DOB: 05-12-1922 Today's Date: 07/25/2024   History of Present Illness Pt is a 89 y/o female who presents 07/21/2024 after a mechanical fall, sustaining a R femur fracture. She is now s/p ORIF of R distal femur fx on 8/11. PMH significant for a-fib, HTN, and known multiple orthopedic issues.    PT Comments  Pt progressing towards physical therapy goals. Was able to perform transfers and ambulation with gross min assist and RW for support. Focus of session was transfer to [facility] to void, and progressing ambulation distance. Noted increased incidence of minor LOB including posteriorly with RW, requiring min assist to recover each time. Pt motivated to participate and expresses how happy she is that she had a BM this morning. Pt is hopeful for transfer to [facility] for rehab today. Will continue to follow.    If plan is discharge home, recommend the following: A lot of help with walking and/or transfers;A lot of help with bathing/dressing/bathroom;Assistance with cooking/housework;Assist for transportation;Help with stairs or ramp for entrance   Can travel by private vehicle     Yes  Equipment Recommendations  None recommended by PT    Recommendations for Other Services       Precautions / Restrictions Precautions Precautions: Fall Recall of Precautions/Restrictions: Intact Restrictions Weight Bearing Restrictions Per Provider Order: Yes RLE Weight Bearing Per Provider Order: Weight bearing as tolerated     Mobility  Bed Mobility Overal bed mobility: Needs Assistance Bed Mobility: Supine to Sit     Supine to sit: Contact guard     General bed mobility comments: Pt able to transition to EOB without assistance. HOB elevated.    Transfers Overall transfer level: Needs assistance Equipment used: Rolling walker (2 wheels) Transfers: Sit to/from Stand, Bed to chair/wheelchair/BSC Sit to Stand: Min  assist   Step pivot transfers: Min assist, +2 physical assistance, +2 safety/equipment       General transfer comment: Assist for power up to full stand and to guide walker around with step-pivot to [facility].    Ambulation/Gait Ambulation/Gait assistance: Min assist Gait Distance (Feet): 90 Feet Assistive device: Rolling walker (2 wheels) Gait Pattern/deviations: Step-through pattern, Decreased stride length, Step-to pattern, Knee flexed in stance - right, Trunk flexed, Narrow base of support Gait velocity: Decreased Gait velocity interpretation: <1.31 ft/sec, indicative of household ambulator   General Gait Details: Slow and guarded with increased instances of min assist for balance support and recovery of posterior LOB this session. Pt reports feeling it is more difficult to ambulate today, however was able to improve gait distance without seated rest break.   Stairs             Wheelchair Mobility     Tilt Bed    Modified Rankin (Stroke Patients Only)       Balance Overall balance assessment: Needs assistance Sitting-balance support: Feet supported Sitting balance-Leahy Scale: Fair     Standing balance support: Bilateral upper extremity supported, During functional activity Standing balance-Leahy Scale: Poor                              Communication Communication Communication: Impaired Factors Affecting Communication: Hearing impaired  Cognition Arousal: Alert Behavior During Therapy: WFL for tasks assessed/performed   PT - Cognitive impairments: No apparent impairments  Following commands: Intact      Cueing Cueing Techniques: Verbal cues, Gestural cues  Exercises General Exercises - Lower Extremity Long Arc Quad: 20 reps, Right    General Comments General comments (skin integrity, edema, etc.): Had BM during session. RN notified.      Pertinent Vitals/Pain Pain Assessment Pain Assessment:  Faces Faces Pain Scale: Hurts a little bit Pain Location: RLE Pain Descriptors / Indicators: Sore Pain Intervention(s): Limited activity within patient's tolerance, Monitored during session, Repositioned    Home Living                          Prior Function            PT Goals (current goals can now be found in the care plan section) Acute Rehab PT Goals Patient Stated Goal: Be able to return to independent living PT Goal Formulation: With patient Time For Goal Achievement: 08/06/24 Potential to Achieve Goals: Good Progress towards PT goals: Progressing toward goals    Frequency    Min 2X/week      PT Plan      Co-evaluation              AM-PAC PT 6 Clicks Mobility   Outcome Measure  Help needed turning from your back to your side while in a flat bed without using bedrails?: A Little Help needed moving from lying on your back to sitting on the side of a flat bed without using bedrails?: A Little Help needed moving to and from a bed to a chair (including a wheelchair)?: A Little Help needed standing up from a chair using your arms (e.g., wheelchair or bedside chair)?: A Little Help needed to walk in hospital room?: A Little Help needed climbing 3-5 steps with a railing? : A Lot 6 Click Score: 17    End of Session Equipment Utilized During Treatment: Gait belt Activity Tolerance: Patient tolerated treatment well Patient left: in chair;with call bell/phone within reach;with chair alarm set Nurse Communication: Mobility status PT Visit Diagnosis: Unsteadiness on feet (R26.81);Pain Pain - Right/Left: Right Pain - part of body: Hip;Knee     Time: 9086-9055 PT Time Calculation (min) (ACUTE ONLY): 31 min  Charges:    $Gait Training: 23-37 mins PT General Charges $$ ACUTE PT VISIT: 1 Visit                     Leah Holland, PT, DPT Acute Rehabilitation Services Secure Chat Preferred Office: [phone]    Leah Holland 07/25/2024,  9:51 AM

## 2024-07-25 NOTE — Plan of Care (Signed)
   Problem: Education: Goal: Knowledge of General Education information will improve Description Including pain rating scale, medication(s)/side effects and non-pharmacologic comfort measures Outcome: Progressing

## 2024-07-25 NOTE — Discharge Summary (Addendum)
 Physician Discharge Summary   Patient: Leah Holland MRN: [id number] DOB: 1922/09/07  Admit date:     07/21/2024  Discharge date: 07/25/24  Discharge Physician: Sabas GORMAN Brod   PCP: [name], MD (Inactive)   Recommendations at discharge:   Follow-up orthopedics, Dr. Franky Haddix in 2 weeks  Discharge Diagnoses: Principal Problem:   Femur fracture, right (HCC)  Resolved Problems:   * No resolved hospital problems. *  Hospital Course:  89 y.o. 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 Appears to be a mechanical fall.  [facility]  Assessment and Plan:  RLE distal femur fracture 2/2 GLF -Orthopedic surgery consulted-underwent ORIF on 8/11  -Multimodal pain control w/ Tylenol  1g TID, gabapentin  300mg  TID, and oxycodone  5mg  q4h prn -Appears to be mechanical fall versus syncope-EEG within normal limits, echo done with preserved EF but some volume overload- IV lasix  x 1 given -Vitamin D  70.74 Patient to go to skilled facility today.   Atrial fibrillation - On Eliquis   - Continue rate controlling medications, metoprolol  and Cardizem    Hypothyroidism resume - Continue Synthroid    Hypokalemia -replete   Anemia - Hemoglobin stable at 11.1   history of total knee replacement as well as right total hip replacement. - positive culture on her total hip replacement and they have been treating that on the right side with suppressive antibiotics with infectious disease. She is been on Bactrim  for years -- will resume   CKD stage IIIb - Creatinine 1.07 at baseline  Hypertension - Continue home medication including chlorthalidone , metoprolol , Cardizem        Consultants: Orthopedics Procedures  performed: ORIF right distal femur fracture Disposition: Skilled nursing facility Diet recommendation:  Discharge Diet Orders (From admission, onward)     Start     Ordered   07/25/24 0000  Diet - low sodium heart healthy        07/25/24 1317           Regular diet DISCHARGE MEDICATION: Allergies as of 07/25/2024       Reactions   Levaquin [levofloxacin] Other (See Comments)   tendinopathy   Morphine  And Codeine Other (See Comments)   Made pt wild   Morphine  Sulfate    Other reaction(s): made pt wild        Medication List     TAKE these medications    acetaminophen  325 MG tablet Commonly known as: TYLENOL  Take 650 mg by mouth as needed for moderate pain (pain score 4-6) or mild pain (pain score 1-3).   Cartia  XT 180 MG 24 hr capsule Generic drug: diltiazem  TAKE ONE CAPSULE BY MOUTH DAILY What changed: when to take this   chlorthalidone  25 MG tablet Commonly known as: HYGROTON  TAKE 1 tablet in the morning with food Orally ONCE DAY What changed: See the new instructions.   Cholecalciferol  50 MCG (2000 UT) Caps Take 2,000 Units by mouth every evening.   Eliquis  2.5 MG Tabs tablet Generic drug: apixaban  TAKE ONE TABLET BY MOUTH TWICE DAILY.   esomeprazole  20 MG capsule Commonly known as: NEXIUM  Take 20 mg by mouth as needed.   Flaxseed Oil 1000 MG Caps Take 1,000 mg by mouth 2 (two) times daily.   furosemide  20 MG tablet Commonly known as: LASIX  Take 20  mg by mouth as needed for edema.   levothyroxine  112 MCG tablet Commonly known as: SYNTHROID  Take 112 mcg by mouth daily before breakfast.   metoprolol  tartrate 50 MG tablet Commonly known as: LOPRESSOR  take 1 tablet with food Orally Twice a day 90 days What changed: See the new instructions.   oxyCODONE  5 MG immediate release tablet Commonly known as: Oxy IR/ROXICODONE  Take 0.5 tablets (2.5 mg total) by mouth every 6 (six) hours as needed for severe pain (pain score 7-10).   polyethylene  glycol 17 g packet Commonly known as: MIRALAX  / GLYCOLAX  Take 17 g by mouth daily as needed for mild constipation.   potassium chloride  10 MEQ tablet Commonly known as: KLOR-CON  M Take 10 mEq by mouth as needed. Take 1 tablet by mouth when taking 1 of the 20 mg of Lasix .   PRESERVISION AREDS 2+MULTI VIT PO Take 1 capsule by mouth in the morning and at bedtime.   Prevagen Extra Strength 20 MG Caps Generic drug: Apoaequorin Take 1 tablet by mouth every evening.   senna 8.6 MG tablet Commonly known as: SENOKOT Take 2 tablets by mouth at bedtime.   sulfamethoxazole -trimethoprim  400-80 MG tablet Commonly known as: BACTRIM  Take 1 tablet by mouth 2 (two) times daily.   temazepam  30 MG capsule Commonly known as: RESTORIL  Take 1 capsule (30 mg total) by mouth at bedtime as needed for sleep. What changed:  when to take this reasons to take this        Follow-up Information     Haddix, Franky SQUIBB, MD. Schedule an appointment as soon as possible for a visit in 2 week(s).   Specialty: Orthopedic Surgery Why: for wound check and repeat x-rays right femur Contact information: [address] [place] [address] [phone]                Discharge Exam: Fredricka Weights   07/21/24 0840 07/22/24 0923  Weight: 59 kg 58.9 kg   Appearance no acute distress S1-S2, regular Lungs clear to auscultation bilaterally Abdomen is soft, nontender, no organomegaly  Condition at discharge: good  The results of significant diagnostics from this hospitalization (including imaging, microbiology, ancillary and laboratory) are listed below for reference.   Imaging Studies: ECHOCARDIOGRAM COMPLETE BUBBLE STUDY Result Date: 07/23/2024    ECHOCARDIOGRAM REPORT   Patient Name:   Leah Holland Date of Exam: 07/23/2024 Medical Rec #:  [id number]        Height:       63.0 in Accession #:    [id number]       Weight:       129.9 lb Date of Birth:  [birth year]        BSA:          1.609 m Patient  Age:    89 years         BP:           122/57 mmHg Patient Gender: F                HR:           103 bpm. Exam Location:  Inpatient Procedure: 2D Echo, Cardiac Doppler and Color Doppler (Both Spectral and Color            Flow Doppler were utilized during procedure). Indications:    Syncope 780.2 / R55  History:        Patient has prior history of Echocardiogram examinations, most  recent 01/18/2021. Risk Factors:Hypertension.  Sonographer:    Benard Stallion Referring Phys: [id number] SARAH A MCCLUNG IMPRESSIONS  1. Left ventricular ejection fraction, by estimation, is 65 to 70%. The left ventricle has normal function. The left ventricle has no regional wall motion abnormalities. Left ventricular diastolic function could not be evaluated. There is the interventricular septum is flattened in systole and diastole, consistent with right ventricular pressure and volume overload.  2. Right ventricular systolic function is low normal. The right ventricular size is normal. There is moderately elevated pulmonary artery systolic pressure. The estimated right ventricular systolic pressure is 49.5 mmHg.  3. Left atrial size was severely dilated.  4. Right atrial size was severely dilated.  5. The mitral valve is degenerative. Moderate mitral valve regurgitation.  6. The tricuspid valve is abnormal. Tricuspid valve regurgitation is severe.  7. The aortic valve is tricuspid. There is mild calcification of the aortic valve. Aortic valve regurgitation is not visualized. Aortic valve sclerosis is present, with no evidence of aortic valve stenosis.  8. The inferior vena cava is normal in size with greater than 50% respiratory variability, suggesting right atrial pressure of 3 mmHg.  9. Agitated saline contrast bubble study was negative, with no evidence of any interatrial shunt. Comparison(s): No significant change from prior study. FINDINGS  Left Ventricle: Left ventricular ejection fraction, by estimation, is 65 to  70%. The left ventricle has normal function. The left ventricle has no regional wall motion abnormalities. The left ventricular internal cavity size was normal in size. There is  no left ventricular hypertrophy. The interventricular septum is flattened in systole and diastole, consistent with right ventricular pressure and volume overload. Left ventricular diastolic function could not be evaluated due to atrial fibrillation. Left ventricular diastolic function could not be evaluated. Right Ventricle: The right ventricular size is normal. No increase in right ventricular wall thickness. Right ventricular systolic function is low normal. There is moderately elevated pulmonary artery systolic pressure. The tricuspid regurgitant velocity  is 3.41 m/s, and with an assumed right atrial pressure of 3 mmHg, the estimated right ventricular systolic pressure is 49.5 mmHg. Left Atrium: Left atrial size was severely dilated. Right Atrium: Right atrial size was severely dilated. Pericardium: There is no evidence of pericardial effusion. Mitral Valve: The mitral valve is degenerative in appearance. There is mild calcification of the anterior mitral valve leaflet(s). Mild to moderate mitral annular calcification. Moderate mitral valve regurgitation. Tricuspid Valve: The tricuspid valve is abnormal. Tricuspid valve regurgitation is severe. No evidence of tricuspid stenosis. Aortic Valve: The aortic valve is tricuspid. There is mild calcification of the aortic valve. Aortic valve regurgitation is not visualized. Aortic valve sclerosis is present, with no evidence of aortic valve stenosis. Aortic valve mean gradient measures 5.0 mmHg. Aortic valve peak gradient measures 9.1 mmHg. Aortic valve area, by VTI measures 2.48 cm. Pulmonic Valve: The pulmonic valve was grossly normal. Pulmonic valve regurgitation is mild. No evidence of pulmonic stenosis. Aorta: The aortic root and ascending aorta are structurally normal, with no evidence  of dilitation. Venous: The inferior vena cava is normal in size with greater than 50% respiratory variability, suggesting right atrial pressure of 3 mmHg. IAS/Shunts: No atrial level shunt detected by color flow Doppler. Agitated saline contrast was given intravenously to evaluate for intracardiac shunting. Agitated saline contrast bubble study was negative, with no evidence of any interatrial shunt.  LEFT VENTRICLE PLAX 2D LVIDd:         3.80 cm   Diastology LVIDs:  2.20 cm   LV e' medial:    10.70 cm/s LV PW:         1.00 cm   LV E/e' medial:  12.7 LV IVS:        1.00 cm   LV e' lateral:   17.00 cm/s LVOT diam:     2.00 cm   LV E/e' lateral: 8.0 LV SV:         67 LV SV Index:   42 LVOT Area:     3.14 cm  RIGHT VENTRICLE RV Basal diam:  3.90 cm RV Mid diam:    2.50 cm RV S prime:     15.90 cm/s TAPSE (M-mode): 2.4 cm LEFT ATRIUM             Index        RIGHT ATRIUM           Index LA diam:        4.90 cm 3.04 cm/m   RA Area:     40.20 cm LA Vol (A2C):   98.0 ml 60.89 ml/m  RA Volume:   155.00 ml 96.31 ml/m LA Vol (A4C):   60.7 ml 37.72 ml/m LA Biplane Vol: 77.7 ml 48.28 ml/m  AORTIC VALVE AV Area (Vmax):    2.43 cm AV Area (Vmean):   2.38 cm AV Area (VTI):     2.48 cm AV Vmax:           151.00 cm/s AV Vmean:          103.000 cm/s AV VTI:            0.270 m AV Peak Grad:      9.1 mmHg AV Mean Grad:      5.0 mmHg LVOT Vmax:         117.00 cm/s LVOT Vmean:        78.000 cm/s LVOT VTI:          0.213 m LVOT/AV VTI ratio: 0.79  AORTA Ao Root diam: 3.10 cm Ao Asc diam:  3.30 cm MITRAL VALVE                TRICUSPID VALVE MV Area (PHT): 4.31 cm     TR Peak grad:   46.5 mmHg MV Decel Time: 176 msec     TR Vmax:        341.00 cm/s MV E velocity: 136.00 cm/s MV A velocity: 32.80 cm/s   SHUNTS MV E/A ratio:  4.15         Systemic VTI:  0.21 m                             Systemic Diam: 2.00 cm Darryle Decent MD Electronically signed by Darryle Decent MD Signature Date/Time: 07/23/2024/10:27:40 AM    Final     DG FEMUR PORT, MIN 2 VIEWS RIGHT Result Date: 07/22/2024 CLINICAL DATA:  Femur fracture, postop. EXAM: RIGHT FEMUR PORTABLE 2 VIEW COMPARISON:  Preoperative imaging FINDINGS: Lateral plate and screw fixation of distal femoral fracture. Decreased fracture displacement from preoperative imaging. Previous hip in the arthroplasty. Recent postsurgical change includes air and edema in the soft tissues. IMPRESSION: ORIF of distal femoral fracture. Electronically Signed   By: Andrea Gasman M.D.   On: 07/22/2024 14:16   DG FEMUR, MIN 2 VIEWS RIGHT Result Date: 07/22/2024 CLINICAL DATA:  Elective surgery. EXAM: RIGHT FEMUR 2 VIEWS COMPARISON:  Preoperative imaging FINDINGS: Seven fluoroscopic spot  views of the right femur submitted from the operating room. Plate and screw fixation of distal femur fracture. Previous knee and hip arthroplasty. Fluoroscopy time 42 seconds. Dose 1.91 mGy. IMPRESSION: Intraoperative fluoroscopy during distal femur fracture ORIF. Electronically Signed   By: Andrea Gasman M.D.   On: 07/22/2024 14:15   DG C-Arm 1-60 Min-No Report Result Date: 07/22/2024 Fluoroscopy was utilized by the requesting physician.  No radiographic interpretation.   EEG adult Result Date: 07/21/2024 Shelton Arlin KIDD, MD     07/21/2024  4:39 PM Patient Name: TAFFIE ECKMANN MRN: [id number] Epilepsy Attending: Arlin KIDD Shelton Referring Physician/Provider: Georgina Basket, MD Date: 07/21/2024 Duration: 22.27 mins Patient history: 89yo F with syncope. EEG to evaluate for seizure Level of alertness: Awake AEDs during EEG study: GBP Technical aspects: This EEG study was done with scalp electrodes positioned according to the 10-20 International system of electrode placement. Electrical activity was reviewed with band pass filter of 1-70Hz , sensitivity of 7 uV/mm, display speed of 1mm/sec with a 60Hz  notched filter applied as appropriate. EEG data were recorded continuously and digitally stored.  Video monitoring  was available and reviewed as appropriate. Description: The posterior dominant rhythm consists of 8-9 Hz activity of moderate voltage (25-35 uV) seen predominantly in posterior head regions, symmetric and reactive to eye opening and eye closing. Hyperventilation and photic stimulation were not performed.   IMPRESSION: This study is within normal limits. No seizures or epileptiform discharges were seen throughout the recording. A normal interictal EEG does not exclude the diagnosis of epilepsy. Arlin KIDD Shelton   DG Chest 1 View Result Date: 07/21/2024 EXAM: 1 VIEW XRAY OF THE CHEST 07/21/2024 09:34:00 AM COMPARISON: 07/25/2017 CLINICAL HISTORY: Fall. Fell off toilet and now has right upper leg pain. FINDINGS: LUNGS AND PLEURA: No focal pulmonary opacity. No pulmonary edema. No pleural effusion. No pneumothorax. HEART AND MEDIASTINUM: Moderate cardiomegaly. Atherosclerosis in the transverse aorta. BONES AND SOFT TISSUES: No acute osseous abnormality. Skin fold over the superolateral right hemithorax. IMPRESSION: 1. No acute process. 2. Moderate cardiomegaly and atherosclerosis in the transverse aorta. Electronically signed by: Rockey Kilts MD 07/21/2024 09:58 AM EDT RP Workstation: [id number]   DG Femur Min 2 Views Right Result Date: 07/21/2024 EXAM: 2 VIEW(S) XRAY OF THE RIGHT FEMUR 07/21/2024 09:34:00 AM COMPARISON: CT Head dated 01/25/2016. CLINICAL HISTORY: Fall. Fell off toilet and now has right upper leg pain. FINDINGS: BONES AND JOINTS: Right hip hemiarthroplasty. Total knee arthroplasty. Osteopenia. Oblique fracture of the mid to distal femoral shaft with nearly 1 shaft width of medial displacement of distal fracture fragment. Extensive overlap and angulation on the lateral view, apex anterior. No intraarticular extension. SOFT TISSUES: Vascular calcifications. IMPRESSION: 1. Oblique fracture of the mid to distal femoral shaft with nearly 1 shaft width of medial displacement of the distal fracture  fragment, extensive overlap, and apex anterior angulation on the lateral view. No intraarticular extension. 2. Right hip hemiarthroplasty and total knee arthroplasty. 3. Osteopenia. Electronically signed by: Rockey Kilts MD 07/21/2024 09:56 AM EDT RP Workstation: [id number]    Microbiology: Results for orders placed or performed during the hospital encounter of 07/21/24  Surgical pcr screen     Status: None   Collection Time: 07/22/24  8:48 AM   Specimen: Nasal Mucosa; Nasal Swab  Result Value Ref Range Status   MRSA, PCR NEGATIVE NEGATIVE Final   Staphylococcus aureus NEGATIVE NEGATIVE Final    Comment: (NOTE) The Xpert SA Assay (FDA approved for NASAL specimens in patients 22 years of  age and older), is one component of a comprehensive surveillance program. It is not intended to diagnose infection nor to guide or monitor treatment. Performed at [facility], [address] [place], [place] [address]     Labs: CBC: Recent Labs  Lab 07/21/24 [phone] 07/23/24 0622 07/23/24 0928 07/24/24 0241 07/25/24 0630  WBC 9.1 11.7* 16.4* 13.1* 10.6*  NEUTROABS 6.5  --   --   --   --   HGB 16.0* 10.9* 11.3* 9.8* 11.1*  HCT 48.6* 33.2* 35.1* 29.7* 33.9*  MCV 95.9 97.9 99.7 97.4 97.1  PLT 265 182 191 175 221   Basic Metabolic Panel: Recent Labs  Lab 07/21/24 0849 07/22/24 0736 07/23/24 0622 07/24/24 0241 07/25/24 0630  NA 138 137 135 138 138  K 3.5 3.3* 4.5 4.0 3.8  CL 99 100 99 97* 98  CO2 27 27 29 31  32  GLUCOSE 150* 128* 161* 123* 104*  BUN 20 10 17  26* 22  CREATININE 1.20* 0.83 1.06* 1.22* 1.07*  CALCIUM  10.9* 9.7 9.6 9.6 10.2   Liver Function Tests: Recent Labs  Lab 07/21/24 0849 07/25/24 0630  AST 22 22  ALT 20 11  ALKPHOS 105 77  BILITOT 0.9 1.5*  PROT 7.2 5.8*  ALBUMIN  3.9 3.1*   CBG: No results for input(s): GLUCAP in the last 168 hours.  Discharge time spent: greater than 30 minutes.  Signed: Sabas GORMAN Brod, MD [facility] 07/25/2024

## 2024-07-25 NOTE — Progress Notes (Signed)
 Leah Holland to be D/C'd  per MD order.  Discussed with the patient and all questions fully answered.  VSS, Skin clean, dry and intact without evidence of skin break down, no evidence of skin tears noted.  IV catheter discontinued intact. Site without signs and symptoms of complications. Dressing and pressure applied.  An After Visit Summary was printed and given to PTAR. PTAR received prescription hard scripts to give to receiving facility.  D/c education completed with patient/family including follow up instructions, medication list, d/c activities limitations if indicated, with other d/c instructions as indicated by MD - patient able to verbalize understanding, all questions fully answered.   Patient instructed to return to ED, call 911, or call MD for any changes in condition.   Patient to be escorted via stretcher by PTAR and transported to [facility]. Call report Elsie, RN prior to [facility] arrival.

## 2024-07-25 NOTE — Progress Notes (Signed)
 Mobility Specialist Progress Note:    07/25/24 1139  Mobility  Activity Pivoted/transferred from chair to bed;Ambulated with assistance (To BR)  Level of Assistance Contact guard assist, steadying assist  Assistive Device Front wheel walker  Distance Ambulated (ft) 20 ft  RLE Weight Bearing Per Provider Order WBAT  Activity Response Tolerated well  Mobility Referral Yes  Mobility visit 1 Mobility  Mobility Specialist Start Time (ACUTE ONLY) 1124  Mobility Specialist Stop Time (ACUTE ONLY) 1136  Mobility Specialist Time Calculation (min) (ACUTE ONLY) 12 min   Received pt in chair and agreeable to mobility. No physical assistance needed. Requested to ambulate to BR prior to getting in bed. Pt had x1 LOB, but self corrected. C/o RLE pain, otherwise tolerated well. Pt voided successfully. Returned pt to bed with alarm on. Personal belongings and call light within reach. All needs met.  Lavanda Pollack Mobility Specialist  Please contact via [employer] or  Rehab Office [phone]

## 2024-07-25 NOTE — Progress Notes (Signed)
 AVS completed for discharge packet and placed with chart.

## 2024-07-26 ENCOUNTER — Non-Acute Institutional Stay (SKILLED_NURSING_FACILITY): Payer: Self-pay | Admitting: Nurse Practitioner

## 2024-07-26 ENCOUNTER — Encounter: Payer: Self-pay | Admitting: Nurse Practitioner

## 2024-07-26 DIAGNOSIS — I480 Paroxysmal atrial fibrillation: Secondary | ICD-10-CM | POA: Diagnosis not present

## 2024-07-26 DIAGNOSIS — R600 Localized edema: Secondary | ICD-10-CM | POA: Insufficient documentation

## 2024-07-26 DIAGNOSIS — S7291XA Unspecified fracture of right femur, initial encounter for closed fracture: Secondary | ICD-10-CM | POA: Diagnosis not present

## 2024-07-26 DIAGNOSIS — K5900 Constipation, unspecified: Secondary | ICD-10-CM

## 2024-07-26 DIAGNOSIS — M810 Age-related osteoporosis without current pathological fracture: Secondary | ICD-10-CM

## 2024-07-26 DIAGNOSIS — K219 Gastro-esophageal reflux disease without esophagitis: Secondary | ICD-10-CM | POA: Diagnosis not present

## 2024-07-26 DIAGNOSIS — E039 Hypothyroidism, unspecified: Secondary | ICD-10-CM

## 2024-07-26 DIAGNOSIS — E871 Hypo-osmolality and hyponatremia: Secondary | ICD-10-CM

## 2024-07-26 DIAGNOSIS — G4700 Insomnia, unspecified: Secondary | ICD-10-CM

## 2024-07-26 NOTE — Progress Notes (Unsigned)
 Location:   SNF [facility] Room Number: 60B Place of Service:  SNF (31) Provider: Larwance Yoshiharu Brassell NP  [name], MD (Inactive)  Patient Care Team: [name], MD (Inactive) as PCP - General (Family Medicine) Court Dorn PARAS, MD as PCP - Cardiology (Cardiology) Shari Sieving, MD as Consulting Physician (Orthopedic Surgery) Court Dorn PARAS, MD as Consulting Physician (Cardiology) Camillo Golas, MD as Consulting Physician (Ophthalmology)  Extended Emergency Contact Information Primary Emergency Contact: [name], [name], [place] [address] [place] Home Phone: [phone] Mobile Phone: [phone] Relation: Son Secondary Emergency Contact: [name] Address: [address]          [place], [place] [address] [place] Home Phone: [phone] Mobile Phone: [phone] Relation: Son  Code Status: DNR Goals of care: Advanced Directive information    07/21/2024    4:28 PM  Advanced Directives  Type of Advance Directive Healthcare Power of [place];Living will  Does patient want to make changes to medical advance directive? No - Patient declined     Chief Complaint  Patient presents with  . Acute Visit    HPI:  Pt is a 89 y.o. female seen today for an acute visit for medication review following hospital stay.    Hospitalized 07/21/2024 to 07/25/2024 distal right femur fracture, status post ORIF 07/22/24, WBAT, ambulating with walker, follow-up Dr. Kendal in 2 Weeks, prn Oxycodone , Tylenol , Gabapentin   OA s/p R+L knee replacement, bil hip prosthesis  History of positive culture on her right total hip replacement, has been treated Bactrim  for years.   Constipation, prn MiraLax , Senokot, last BM yesterday  GERD, taking Nexium   Post op anemia, Hgb 11.1 07/25/24   Afib, takes Eliquis  [place] vas 2 score >3, takes Diltiazem , Metoprolol              Hypothyroidism, takes Levothyroxine , TSH 1.197 10/02/20, no recent  TSH located.              Hyponatremia, SIADH, hx of fluid restriction, Na 138 07/25/24,  treated with Furosemide  in the past             Hypokalemia, takes Kcl, K 3.8 07/25/24             Insomnia, takes Temazepam  30mg  prn             Edema BLE, Furosemide . Chlorthalidone  25mg  qd.             Depression, off Duloxetine              OP, off Prolia , taking  Vit D, Vit D 70.72 07/23/24                 Past Surgical History:  Procedure Laterality Date  . 2D ECHOCARDIOGRAM  12/07/2004   EF 48%, moderate pulmonary hypertension  . BREAST BIOPSY Left ?1970's  . CARDIOVASCULAR STRESS TEST  12/07/11  . CARDIOVASCULAR STRESS TEST  12/07/2011   LEXISCAN , normal scan, no significant wall abnormalities noted  . CARPAL TUNNEL RELEASE Right ~ 2005  . CATARACT EXTRACTION, BILATERAL Bilateral 2000  . CYSTOSCOPY  11/02/2021   Procedure: CYSTOSCOPY;  Surgeon: Viktoria Comer SAUNDERS, MD;  Location: [facility];  Service: Gynecology;;  . DILATION AND CURETTAGE OF UTERUS  1950's  . DOPPLER ECHOCARDIOGRAPHY  12/07/11  . EYE SURGERY    . FEMUR IM NAIL Right 08/01/2015   Procedure: INTRAMEDULLARY (IM) NAIL FEMORAL;  Surgeon: Fonda Olmsted, MD;  Location:  [facility] OR;  Service: Orthopedics;  Laterality: Right;  . HARDWARE REMOVAL Right 03/16/2016   Procedure: HARDWARE REMOVAL;  Surgeon: Fonda Olmsted, MD;  Location: [facility] OR;  Service: Orthopedics;  Laterality: Right;  . INTRAMEDULLARY (IM) NAIL INTERTROCHANTERIC Left 01/26/2017   Procedure: INTRAMEDULLARY (IM) NAIL INTERTROCHANTRIC;  Surgeon: Kay CHRISTELLA Cummins, MD;  Location: [facility] OR;  Service: Orthopedics;  Laterality: Left;  . JOINT REPLACEMENT     bilateral knee replacement  . ORIF FEMUR FRACTURE Right 07/22/2024   Procedure: OPEN REDUCTION INTERNAL FIXATION (ORIF) DISTAL FEMUR FRACTURE;  Surgeon: Kendal Franky SQUIBB, MD;  Location: [facility] OR;  Service: Orthopedics;  Laterality: Right;  . ROBOTIC ASSISTED BILATERAL SALPINGO OOPHERECTOMY Bilateral 11/02/2021   Procedure: XI ROBOTIC ASSISTED  BILATERAL SALPINGO OOPHORECTOMY;  Surgeon: Viktoria Comer SAUNDERS, MD;  Location: [facility];  Service: Gynecology;  Laterality: Bilateral;  . ROBOTIC ASSISTED TOTAL HYSTERECTOMY N/A 11/02/2021   Procedure: XI ROBOTIC ASSISTED TOTAL HYSTERECTOMY WITH  MINI LAPAROTOMY;  Surgeon: Viktoria Comer SAUNDERS, MD;  Location: [facility];  Service: Gynecology;  Laterality: N/A;  . SKIN CANCER EXCISION  2000's   off my nose (08/13/2013)  . TOTAL HIP ARTHROPLASTY Right 03/16/2016   Procedure: TOTAL HIP ARTHROPLASTY;  Surgeon: Fonda Olmsted, MD;  Location: [facility];  Service: Orthopedics;  Laterality: Right;  . TOTAL HIP ARTHROPLASTY Left 06/09/2017   Procedure: REMOVE LEFT HIP INTRAMEDULLARY NAIL AND CONVERT TO LEFT CEMENTED HEMI HIP ARTHROPLASTY;  Surgeon: Cummins Kay CHRISTELLA, MD;  Location: [facility];  Service: Orthopedics;  Laterality: Left;  . TOTAL KNEE ARTHROPLASTY Right 2005  . TOTAL KNEE ARTHROPLASTY  03/02/2012   Procedure: TOTAL KNEE ARTHROPLASTY;  Surgeon: LELON JONETTA Shari Mickey., MD;  Location: [facility];  Service: Orthopedics;  Laterality: Left;    Allergies  Allergen Reactions  . Levaquin [Levofloxacin] Other (See Comments)    tendinopathy  . Morphine  And Codeine Other (See Comments)    Made pt wild  . Morphine  Sulfate     Other reaction(s): made pt wild    Allergies as of 07/26/2024       Reactions   Levaquin [levofloxacin] Other (See Comments)   tendinopathy   Morphine  And Codeine Other (See Comments)   Made pt wild   Morphine  Sulfate    Other reaction(s): made pt wild        Medication List        Accurate as of July 26, 2024 12:56 PM. If you have any questions, ask your nurse or doctor.          STOP taking these medications    Cartia  XT 180 MG 24 hr capsule Generic drug: diltiazem  Stopped by: Cabell Lazenby X Ellias Mcelreath   chlorthalidone  25 MG tablet Commonly known as: HYGROTON  Stopped by: Paulyne Mooty X Karrington Mccravy   metoprolol  tartrate 50 MG tablet Commonly known as: LOPRESSOR  Stopped by: Rashunda Passon X Leshae Mcclay       TAKE these  medications    acetaminophen  325 MG tablet Commonly known as: TYLENOL  Take 650 mg by mouth as needed for moderate pain (pain score 4-6) or mild pain (pain score 1-3).   Cholecalciferol  50 MCG (2000 UT) Caps Take 2,000 Units by mouth every evening.   Eliquis  2.5 MG Tabs tablet Generic drug: apixaban  TAKE ONE TABLET BY MOUTH TWICE DAILY.   esomeprazole  20 MG capsule Commonly known as: NEXIUM  Take 20 mg by mouth as needed.   Flaxseed Oil 1000 MG Caps Take 1,000 mg by mouth 2 (two) times daily.   furosemide  20 MG tablet Commonly  known as: LASIX  Take 20 mg by mouth as needed for edema.   levothyroxine  112 MCG tablet Commonly known as: SYNTHROID  Take 112 mcg by mouth daily before breakfast.   oxyCODONE  5 MG immediate release tablet Commonly known as: Oxy IR/ROXICODONE  Take 0.5 tablets (2.5 mg total) by mouth every 6 (six) hours as needed for severe pain (pain score 7-10).   polyethylene glycol 17 g packet Commonly known as: MIRALAX  / GLYCOLAX  Take 17 g by mouth daily as needed for mild constipation.   potassium chloride  10 MEQ tablet Commonly known as: KLOR-CON  M Take 10 mEq by mouth as needed. Take 1 tablet by mouth when taking 1 of the 20 mg of Lasix .   PRESERVISION AREDS 2+MULTI VIT PO Take 1 capsule by mouth in the morning and at bedtime.   Prevagen Extra Strength 20 MG Caps Generic drug: Apoaequorin Take 1 tablet by mouth every evening.   senna 8.6 MG tablet Commonly known as: SENOKOT Take 2 tablets by mouth at bedtime.   sulfamethoxazole -trimethoprim  400-80 MG tablet Commonly known as: BACTRIM  Take 1 tablet by mouth 2 (two) times daily.   temazepam  30 MG capsule Commonly known as: RESTORIL  Take 1 capsule (30 mg total) by mouth at bedtime as needed for sleep.        Review of Systems  Constitutional:  Negative for appetite change, fatigue and fever.  HENT:  Positive for hearing loss. Negative for congestion and trouble swallowing.   Eyes:        Wears  glasses   Respiratory:  Negative for cough, chest tightness, shortness of breath and wheezing.   Cardiovascular:  Positive for leg swelling. Negative for chest pain and palpitations.  Gastrointestinal:  Negative for abdominal pain and constipation.       Has GERD  Genitourinary:  Negative for dysuria and frequency.  Musculoskeletal:  Positive for arthralgias and gait problem. Negative for back pain.       Has history of fracture and replacement of right hip and left hip. Pain medication is helping with her left hip pain.   Skin:  Negative for color change.  Neurological:  Negative for syncope, light-headedness and headaches.  Hematological:  Does not bruise/bleed easily.  Psychiatric/Behavioral:  Positive for sleep disturbance. Negative for confusion. The patient is nervous/anxious.     Immunization History  Administered Date(s) Administered  . Influenza Whole 03/05/2014  . Influenza,inj,Quad PF,6+ Mos 10/12/2012, 08/22/2016, 10/16/2017  . Influenza-Unspecified 08/27/2013, 10/01/2020  . PPD Test 10/16/2012  . Pneumococcal Polysaccharide-23 11/07/2011  . Unspecified SARS-COV-2 Vaccination 12/16/2019, 01/13/2020  . Zoster Recombinant(Shingrix) 11/28/2017, 02/26/2018   Pertinent  Health Maintenance Due  Topic Date Due  . FOOT EXAM  Never done  . OPHTHALMOLOGY EXAM  Never done  . DEXA SCAN  Never done  . HEMOGLOBIN A1C  04/26/2022  . INFLUENZA VACCINE  07/12/2024      01/19/2021   10:20 AM 09/27/2021   11:21 AM 11/02/2021    2:52 PM 11/03/2021    2:00 AM 02/28/2022   11:18 AM  Fall Risk  Falls in the past year? 0    0  Was there an injury with Fall?     0  Fall Risk Category Calculator     0  Fall Risk Category (Retired)     Low   (RETIRED) Patient Fall Risk Level Moderate fall risk  High fall risk  High fall risk  High fall risk    Patient at Risk for Falls Due to History of  fall(s)      Fall risk Follow up Falls evaluation completed          Data saved with a previous  flowsheet row definition   Functional Status Survey:    Vitals:   07/26/24 1226  BP: (!) 152/75  Pulse: 90  Resp: 20  Temp: 97.9 F (36.6 C)  SpO2: 97%   There is no height or weight on file to calculate BMI. Physical Exam Constitutional:      Appearance: Normal appearance. She is well-developed.  HENT:     Head: Normocephalic and atraumatic.     Nose: Nose normal.     Mouth/Throat:     Mouth: Mucous membranes are moist.  Eyes:     Conjunctiva/sclera: Conjunctivae normal.     Pupils: Pupils are equal, round, and reactive to light.  Neck:     Thyroid : No thyromegaly.  Cardiovascular:     Rate and Rhythm: Normal rate and regular rhythm.     Heart sounds: No murmur heard. Pulmonary:     Effort: Pulmonary effort is normal.     Breath sounds: Normal breath sounds.  Abdominal:     General: Bowel sounds are normal.     Palpations: Abdomen is soft.     Tenderness: There is no abdominal tenderness.  Musculoskeletal:        General: Tenderness present.     Cervical back: Normal range of motion and neck supple.     Right lower leg: Edema present.     Left lower leg: Edema present.     Comments: RLE edema 1+ LLE edema trace R distal femur s/p ORIF  Lymphadenopathy:     Cervical: No cervical adenopathy.  Skin:    General: Skin is warm and dry.     Findings: Erythema present.     Comments: R distal femur s/p ORIF, covered in dressing.   Neurological:     Mental Status: She is alert and oriented to person, place, and time. Mental status is at baseline.     Motor: No weakness.     Coordination: Coordination normal.     Gait: Gait abnormal.  Psychiatric:        Mood and Affect: Mood normal.        Behavior: Behavior normal.     Labs reviewed: Recent Labs    07/23/24 0622 07/24/24 0241 07/25/24 0630  NA 135 138 138  K 4.5 4.0 3.8  CL 99 97* 98  CO2 29 31 32  GLUCOSE 161* 123* 104*  BUN 17 26* 22  CREATININE 1.06* 1.22* 1.07*  CALCIUM  9.6 9.6 10.2   Recent  Labs    07/21/24 0849 07/25/24 0630  AST 22 22  ALT 20 11  ALKPHOS 105 77  BILITOT 0.9 1.5*  PROT 7.2 5.8*  ALBUMIN  3.9 3.1*   Recent Labs    06/24/24 1011 07/21/24 0849 07/23/24 0622 07/23/24 0928 07/24/24 0241 07/25/24 0630  WBC 7.2 9.1   < > 16.4* 13.1* 10.6*  NEUTROABS 4,298 6.5  --   --   --   --   HGB 15.4 16.0*   < > 11.3* 9.8* 11.1*  HCT 47.7* 48.6*   < > 35.1* 29.7* 33.9*  MCV 97.7 95.9   < > 99.7 97.4 97.1  PLT 227 265   < > 191 175 221   < > = values in this interval not displayed.   Lab Results  Component Value Date   TSH 1.197 10/02/2020  Lab Results  Component Value Date   HGBA1C 5.9 (H) 10/27/2021   Lab Results  Component Value Date   CHOL 116 04/26/2013   HDL 45 04/26/2013   LDLCALC 60 04/26/2013   TRIG 56 04/26/2013   CHOLHDL 2.6 04/26/2013    Significant Diagnostic Results in last 30 days:  ECHOCARDIOGRAM COMPLETE BUBBLE STUDY Result Date: 07/23/2024    ECHOCARDIOGRAM REPORT   Patient Name:   Leah PATERNOSTRO Date of Exam: 07/23/2024 Medical Rec #:  [id number]        Height:       63.0 in Accession #:    [id number]       Weight:       129.9 lb Date of Birth:  08/21/1922        BSA:          1.609 m Patient Age:    89 years         BP:           122/57 mmHg Patient Gender: F                HR:           103 bpm. Exam Location:  Inpatient Procedure: 2D Echo, Cardiac Doppler and Color Doppler (Both Spectral and Color            Flow Doppler were utilized during procedure). Indications:    Syncope 780.2 / R55  History:        Patient has prior history of Echocardiogram examinations, most                 recent 01/18/2021. Risk Factors:Hypertension.  Sonographer:    Benard Stallion Referring Phys: [id number] SARAH A MCCLUNG IMPRESSIONS  1. Left ventricular ejection fraction, by estimation, is 65 to 70%. The left ventricle has normal function. The left ventricle has no regional wall motion abnormalities. Left ventricular diastolic function could not be evaluated.  There is the interventricular septum is flattened in systole and diastole, consistent with right ventricular pressure and volume overload.  2. Right ventricular systolic function is low normal. The right ventricular size is normal. There is moderately elevated pulmonary artery systolic pressure. The estimated right ventricular systolic pressure is 49.5 mmHg.  3. Left atrial size was severely dilated.  4. Right atrial size was severely dilated.  5. The mitral valve is degenerative. Moderate mitral valve regurgitation.  6. The tricuspid valve is abnormal. Tricuspid valve regurgitation is severe.  7. The aortic valve is tricuspid. There is mild calcification of the aortic valve. Aortic valve regurgitation is not visualized. Aortic valve sclerosis is present, with no evidence of aortic valve stenosis.  8. The inferior vena cava is normal in size with greater than 50% respiratory variability, suggesting right atrial pressure of 3 mmHg.  9. Agitated saline contrast bubble study was negative, with no evidence of any interatrial shunt. Comparison(s): No significant change from prior study. FINDINGS  Left Ventricle: Left ventricular ejection fraction, by estimation, is 65 to 70%. The left ventricle has normal function. The left ventricle has no regional wall motion abnormalities. The left ventricular internal cavity size was normal in size. There is  no left ventricular hypertrophy. The interventricular septum is flattened in systole and diastole, consistent with right ventricular pressure and volume overload. Left ventricular diastolic function could not be evaluated due to atrial fibrillation. Left ventricular diastolic function could not be evaluated. Right Ventricle: The right ventricular size is normal. No increase in right ventricular wall  thickness. Right ventricular systolic function is low normal. There is moderately elevated pulmonary artery systolic pressure. The tricuspid regurgitant velocity  is 3.41 m/s, and  with an assumed right atrial pressure of 3 mmHg, the estimated right ventricular systolic pressure is 49.5 mmHg. Left Atrium: Left atrial size was severely dilated. Right Atrium: Right atrial size was severely dilated. Pericardium: There is no evidence of pericardial effusion. Mitral Valve: The mitral valve is degenerative in appearance. There is mild calcification of the anterior mitral valve leaflet(s). Mild to moderate mitral annular calcification. Moderate mitral valve regurgitation. Tricuspid Valve: The tricuspid valve is abnormal. Tricuspid valve regurgitation is severe. No evidence of tricuspid stenosis. Aortic Valve: The aortic valve is tricuspid. There is mild calcification of the aortic valve. Aortic valve regurgitation is not visualized. Aortic valve sclerosis is present, with no evidence of aortic valve stenosis. Aortic valve mean gradient measures 5.0 mmHg. Aortic valve peak gradient measures 9.1 mmHg. Aortic valve area, by VTI measures 2.48 cm. Pulmonic Valve: The pulmonic valve was grossly normal. Pulmonic valve regurgitation is mild. No evidence of pulmonic stenosis. Aorta: The aortic root and ascending aorta are structurally normal, with no evidence of dilitation. Venous: The inferior vena cava is normal in size with greater than 50% respiratory variability, suggesting right atrial pressure of 3 mmHg. IAS/Shunts: No atrial level shunt detected by color flow Doppler. Agitated saline contrast was given intravenously to evaluate for intracardiac shunting. Agitated saline contrast bubble study was negative, with no evidence of any interatrial shunt.  LEFT VENTRICLE PLAX 2D LVIDd:         3.80 cm   Diastology LVIDs:         2.20 cm   LV e' medial:    10.70 cm/s LV PW:         1.00 cm   LV E/e' medial:  12.7 LV IVS:        1.00 cm   LV e' lateral:   17.00 cm/s LVOT diam:     2.00 cm   LV E/e' lateral: 8.0 LV SV:         67 LV SV Index:   42 LVOT Area:     3.14 cm  RIGHT VENTRICLE RV Basal diam:  3.90 cm  RV Mid diam:    2.50 cm RV S prime:     15.90 cm/s TAPSE (M-mode): 2.4 cm LEFT ATRIUM             Index        RIGHT ATRIUM           Index LA diam:        4.90 cm 3.04 cm/m   RA Area:     40.20 cm LA Vol (A2C):   98.0 ml 60.89 ml/m  RA Volume:   155.00 ml 96.31 ml/m LA Vol (A4C):   60.7 ml 37.72 ml/m LA Biplane Vol: 77.7 ml 48.28 ml/m  AORTIC VALVE AV Area (Vmax):    2.43 cm AV Area (Vmean):   2.38 cm AV Area (VTI):     2.48 cm AV Vmax:           151.00 cm/s AV Vmean:          103.000 cm/s AV VTI:            0.270 m AV Peak Grad:      9.1 mmHg AV Mean Grad:      5.0 mmHg LVOT Vmax:         117.00  cm/s LVOT Vmean:        78.000 cm/s LVOT VTI:          0.213 m LVOT/AV VTI ratio: 0.79  AORTA Ao Root diam: 3.10 cm Ao Asc diam:  3.30 cm MITRAL VALVE                TRICUSPID VALVE MV Area (PHT): 4.31 cm     TR Peak grad:   46.5 mmHg MV Decel Time: 176 msec     TR Vmax:        341.00 cm/s MV E velocity: 136.00 cm/s MV A velocity: 32.80 cm/s   SHUNTS MV E/A ratio:  4.15         Systemic VTI:  0.21 m                             Systemic Diam: 2.00 cm Darryle Decent MD Electronically signed by Darryle Decent MD Signature Date/Time: 07/23/2024/10:27:40 AM    Final    DG FEMUR PORT, MIN 2 VIEWS RIGHT Result Date: 07/22/2024 CLINICAL DATA:  Femur fracture, postop. EXAM: RIGHT FEMUR PORTABLE 2 VIEW COMPARISON:  Preoperative imaging FINDINGS: Lateral plate and screw fixation of distal femoral fracture. Decreased fracture displacement from preoperative imaging. Previous hip in the arthroplasty. Recent postsurgical change includes air and edema in the soft tissues. IMPRESSION: ORIF of distal femoral fracture. Electronically Signed   By: Andrea Gasman M.D.   On: 07/22/2024 14:16   DG FEMUR, MIN 2 VIEWS RIGHT Result Date: 07/22/2024 CLINICAL DATA:  Elective surgery. EXAM: RIGHT FEMUR 2 VIEWS COMPARISON:  Preoperative imaging FINDINGS: Seven fluoroscopic spot views of the right femur submitted from the operating room.  Plate and screw fixation of distal femur fracture. Previous knee and hip arthroplasty. Fluoroscopy time 42 seconds. Dose 1.91 mGy. IMPRESSION: Intraoperative fluoroscopy during distal femur fracture ORIF. Electronically Signed   By: Andrea Gasman M.D.   On: 07/22/2024 14:15   DG C-Arm 1-60 Min-No Report Result Date: 07/22/2024 Fluoroscopy was utilized by the requesting physician.  No radiographic interpretation.   EEG adult Result Date: 07/21/2024 Shelton Arlin KIDD, MD     07/21/2024  4:39 PM Patient Name: Leah Holland MRN: [id number] Epilepsy Attending: Arlin KIDD Shelton Referring Physician/Provider: Georgina Basket, MD Date: 07/21/2024 Duration: 22.27 mins Patient history: 89yo F with syncope. EEG to evaluate for seizure Level of alertness: Awake AEDs during EEG study: GBP Technical aspects: This EEG study was done with scalp electrodes positioned according to the 10-20 International system of electrode placement. Electrical activity was reviewed with band pass filter of 1-70Hz , sensitivity of 7 uV/mm, display speed of 61mm/sec with a 60Hz  notched filter applied as appropriate. EEG data were recorded continuously and digitally stored.  Video monitoring was available and reviewed as appropriate. Description: The posterior dominant rhythm consists of 8-9 Hz activity of moderate voltage (25-35 uV) seen predominantly in posterior head regions, symmetric and reactive to eye opening and eye closing. Hyperventilation and photic stimulation were not performed.   IMPRESSION: This study is within normal limits. No seizures or epileptiform discharges were seen throughout the recording. A normal interictal EEG does not exclude the diagnosis of epilepsy. Arlin KIDD Shelton   DG Chest 1 View Result Date: 07/21/2024 EXAM: 1 VIEW XRAY OF THE CHEST 07/21/2024 09:34:00 AM COMPARISON: 07/25/2017 CLINICAL HISTORY: Fall. Fell off toilet and now has right upper leg pain. FINDINGS: LUNGS AND PLEURA: No focal pulmonary opacity.  No pulmonary  edema. No pleural effusion. No pneumothorax. HEART AND MEDIASTINUM: Moderate cardiomegaly. Atherosclerosis in the transverse aorta. BONES AND SOFT TISSUES: No acute osseous abnormality. Skin fold over the superolateral right hemithorax. IMPRESSION: 1. No acute process. 2. Moderate cardiomegaly and atherosclerosis in the transverse aorta. Electronically signed by: Rockey Kilts MD 07/21/2024 09:58 AM EDT RP Workstation: [id number]   DG Femur Min 2 Views Right Result Date: 07/21/2024 EXAM: 2 VIEW(S) XRAY OF THE RIGHT FEMUR 07/21/2024 09:34:00 AM COMPARISON: CT Head dated 01/25/2016. CLINICAL HISTORY: Fall. Fell off toilet and now has right upper leg pain. FINDINGS: BONES AND JOINTS: Right hip hemiarthroplasty. Total knee arthroplasty. Osteopenia. Oblique fracture of the mid to distal femoral shaft with nearly 1 shaft width of medial displacement of distal fracture fragment. Extensive overlap and angulation on the lateral view, apex anterior. No intraarticular extension. SOFT TISSUES: Vascular calcifications. IMPRESSION: 1. Oblique fracture of the mid to distal femoral shaft with nearly 1 shaft width of medial displacement of the distal fracture fragment, extensive overlap, and apex anterior angulation on the lateral view. No intraarticular extension. 2. Right hip hemiarthroplasty and total knee arthroplasty. 3. Osteopenia. Electronically signed by: Rockey Kilts MD 07/21/2024 09:56 AM EDT RP Workstation: [id number]    Assessment/Plan: Femur fracture, right (HCC) Hospitalized 07/21/2024 to 07/25/2024 distal right femur fracture, status post ORIF 07/22/24, WBAT, ambulating with walker, follow-up Dr. Kendal in 2 Weeks, prn Oxycodone , Tylenol , Gabapentin   OA s/p R+L knee replacement, bil hip prosthesis  History of positive culture on her right total hip replacement, has been treated Bactrim  for years.   Update CBC/diff  Constipation Stable,  prn MiraLax , Senokot, last BM yesterday  GERD  (gastroesophageal reflux disease) Stable, taking Nexium   Paroxysmal atrial fibrillation (HCC) Heart rate is in control,  takes Eliquis  [place] vas 2 score >3, takes Diltiazem , Metoprolol   Hypothyroidism  takes Levothyroxine , TSH 1.197 10/02/20, no recent TSH located.  Repeat TSH  Hyponatremia  SIADH, hx of fluid restriction, Na 138 07/25/24,  treated with Furosemide  in the past, repeat CMP/GFR  Insomnia Longstanding issue, restless last night, takes Temazepam  30mg  prn  Edema, peripheral Mild edema right lower extremity, Furosemide . Chlorthalidone  25mg  qd.  Osteoporosis off Prolia , taking  Vit D, Vit D 70.72 07/23/24    Family/ staff Communication: Plan of care reviewed with the patient and charge nurse  Labs/tests ordered: CBC/differential, CMP/eGFR, TSH

## 2024-07-26 NOTE — Assessment & Plan Note (Signed)
 Mild edema right lower extremity, Furosemide . Chlorthalidone  25mg  qd.

## 2024-07-26 NOTE — Assessment & Plan Note (Signed)
 Hospitalized 07/21/2024 to 07/25/2024 distal right femur fracture, status post ORIF 07/22/24, WBAT, ambulating with walker, follow-up Dr. Kendal in 2 Weeks, prn Oxycodone , Tylenol , Gabapentin   OA s/p R+L knee replacement, bil hip prosthesis  History of positive culture on her right total hip replacement, has been treated Bactrim  for years.   Update CBC/diff

## 2024-07-26 NOTE — Assessment & Plan Note (Signed)
 off Prolia , taking  Vit D, Vit D 70.72 07/23/24

## 2024-07-26 NOTE — Assessment & Plan Note (Signed)
 takes Levothyroxine , TSH 1.197 10/02/20, no recent TSH located.  Repeat TSH

## 2024-07-26 NOTE — Assessment & Plan Note (Signed)
 Heart rate is in control,  takes Eliquis  [place] vas 2 score >3, takes Diltiazem , Metoprolol 

## 2024-07-26 NOTE — Assessment & Plan Note (Signed)
 Stable,  prn MiraLax , Senokot, last BM yesterday

## 2024-07-26 NOTE — Assessment & Plan Note (Signed)
 Stable, taking Nexium 

## 2024-07-26 NOTE — Assessment & Plan Note (Signed)
 SIADH, hx of fluid restriction, Na 138 07/25/24,  treated with Furosemide  in the past, repeat CMP/GFR

## 2024-07-26 NOTE — Assessment & Plan Note (Signed)
 Longstanding issue, restless last night, takes Temazepam  30mg  prn

## 2024-07-30 ENCOUNTER — Encounter: Payer: Self-pay | Admitting: Nurse Practitioner

## 2024-07-30 ENCOUNTER — Non-Acute Institutional Stay (SKILLED_NURSING_FACILITY): Payer: Self-pay | Admitting: Nurse Practitioner

## 2024-07-30 DIAGNOSIS — T8450XD Infection and inflammatory reaction due to unspecified internal joint prosthesis, subsequent encounter: Secondary | ICD-10-CM | POA: Diagnosis not present

## 2024-07-30 DIAGNOSIS — K5900 Constipation, unspecified: Secondary | ICD-10-CM

## 2024-07-30 DIAGNOSIS — M7989 Other specified soft tissue disorders: Secondary | ICD-10-CM

## 2024-07-30 DIAGNOSIS — K219 Gastro-esophageal reflux disease without esophagitis: Secondary | ICD-10-CM | POA: Diagnosis not present

## 2024-07-30 DIAGNOSIS — B370 Candidal stomatitis: Secondary | ICD-10-CM | POA: Insufficient documentation

## 2024-07-30 DIAGNOSIS — E876 Hypokalemia: Secondary | ICD-10-CM

## 2024-07-30 DIAGNOSIS — D62 Acute posthemorrhagic anemia: Secondary | ICD-10-CM

## 2024-07-30 DIAGNOSIS — G4700 Insomnia, unspecified: Secondary | ICD-10-CM

## 2024-07-30 DIAGNOSIS — E039 Hypothyroidism, unspecified: Secondary | ICD-10-CM

## 2024-07-30 DIAGNOSIS — E871 Hypo-osmolality and hyponatremia: Secondary | ICD-10-CM

## 2024-07-30 DIAGNOSIS — I480 Paroxysmal atrial fibrillation: Secondary | ICD-10-CM

## 2024-07-30 NOTE — Assessment & Plan Note (Signed)
 SIADH, hx of fluid restriction, Na 138 07/25/24>137 07/30/24  treated with Furosemide  in the past

## 2024-07-30 NOTE — Assessment & Plan Note (Signed)
 takes Temazepam  30mg  prn

## 2024-07-30 NOTE — Progress Notes (Unsigned)
 Location:  [facility] Room Number: N057-A Place of Service:  SNF (31) Provider:  Lanice Folden X, NP      Patient Care Team: [name], MD (Inactive) as PCP - General (Family Medicine) Court Dorn PARAS, MD as PCP - Cardiology (Cardiology) Shari Sieving, MD as Consulting Physician (Orthopedic Surgery) Court Dorn PARAS, MD as Consulting Physician (Cardiology) Camillo Golas, MD as Consulting Physician (Ophthalmology)  Extended Emergency Contact Information Primary Emergency Contact: Sham, [place], [place] [address] [place] Home Phone: [phone] Mobile Phone: [phone] Relation: Son Secondary Emergency Contact: [name] Address: [address]          [place], [place] [address] [place] Home Phone: [phone] Mobile Phone: [phone] Relation: Son  Code Status:  Full code Goals of care: Advanced Directive information    07/21/2024    4:28 PM  Advanced Directives  Type of Advance Directive Healthcare Power of [place];Living will  Does patient want to make changes to medical advance directive? No - Patient declined     Chief Complaint  Patient presents with   Lip Laceration     sore tongue    HPI:  Pt is a 89 y.o. female seen today for an acute visit for complaining of sore of her tongue, no noted lesion on oral lining, mild erythema of her tongue.  Hospitalized 07/21/2024 to 07/25/2024 distal right femur fracture, status post ORIF 07/22/24, WBAT, ambulating with walker, follow-up Dr. Kendal in 2 Weeks, prn Oxycodone , Tylenol , Gabapentin              OA s/p R+L knee replacement, bil hip prosthesis             History of positive culture on her right total hip replacement, has been treated Bactrim  for years.              Constipation, prn MiraLax , Senokot             GERD, taking Nexium              Post op anemia, Hgb 11.1 07/25/24<11.6 07/30/24              Afib, takes Eliquis   [place] vas 2 score >3, takes Diltiazem , Metoprolol              Hypothyroidism, takes Levothyroxine , TSH 1.197 10/02/20, no recent TSH located.              Hyponatremia, SIADH, hx of fluid restriction, Na 138 07/25/24>137 07/30/24  treated with Furosemide  in the past             Hypokalemia, takes Kcl, K 3.8 07/25/24>3.3 07/30/24             Insomnia, takes Temazepam  30mg  prn             Edema BLE, Furosemide  prn, Chlorthalidone  25mg  qd.             Depression, off Duloxetine              OP, off Prolia , taking  Vit D, Vit D 70.72 07/23/24                 Past Medical History:  Diagnosis Date   Anxiety attack    take RX prn (08/13/2013)   Arthritis    probably (08/13/2013)   Atrial fibrillation (HCC)    new onset/pt report 08/13/2013   Bladder wall thickening 07/01/2021  CT scan in Epic   Bronchitis 12/2011   Cholelithiasis 07/01/2021   CT in Epic.   Common bile duct dilation 07/01/2021   CT in Epic:  13 mm.   Complication of anesthesia    Compression fracture of first lumbar vertebra (HCC) 07/01/2021   CT in Epic.   Depression    Femur fracture (HCC)    left proximal femur non-union   GERD (gastroesophageal reflux disease)    Grieving 05/03/2023   H/O hiatal hernia    Hardware complicating wound infection (HCC) 08/22/2016   History of blood transfusion    w/both knee OR's (08/13/2013)   Hyperlipidemia    Hyperlipidemia    Hypertension    Hypertensive urgency 10/22/2018   Hypothyroidism    takes synthroid    Insomnia    Intertrochanteric fracture of right hip (HCC) 07/30/2015   Neuromuscular disorder (HCC)    carpal tunnel in right hand   Periprosthetic fracture around internal prosthetic right hip joint (HCC), nonunion basicervical femoral neck with broken hardware and subtrochanteric fracture 03/16/2016   PONV (postoperative nausea and vomiting)    Prosthetic hip infection (HCC) 08/22/2016   Prosthetic joint infection (HCC) 05/02/2023   Proteus infection 08/22/2016    Serratia infection 11/14/2016   Shingles    Skin cancer of face 2000's   one on each side of my face and one on my nose (08/13/2013)   Swelling of extremity    sees Dr. Court, cardiologist 1 x year [phone]   Tendon dysfunction 08/22/2016   Wears hearing aid    [facility]   Past Surgical History:  Procedure Laterality Date   2D ECHOCARDIOGRAM  12/07/2004   EF 48%, moderate pulmonary hypertension   BREAST BIOPSY Left ?1970's   CARDIOVASCULAR STRESS TEST  12/07/11   CARDIOVASCULAR STRESS TEST  12/07/2011   LEXISCAN , normal scan, no significant wall abnormalities noted   CARPAL TUNNEL RELEASE Right ~ 2005   CATARACT EXTRACTION, BILATERAL Bilateral 2000   CYSTOSCOPY  11/02/2021   Procedure: CYSTOSCOPY;  Surgeon: Viktoria Comer SAUNDERS, MD;  Location: [facility];  Service: Gynecology;;   DILATION AND CURETTAGE OF UTERUS  1950's   DOPPLER ECHOCARDIOGRAPHY  12/07/11   EYE SURGERY     FEMUR IM NAIL Right 08/01/2015   Procedure: INTRAMEDULLARY (IM) NAIL FEMORAL;  Surgeon: Fonda Olmsted, MD;  Location: [facility] OR;  Service: Orthopedics;  Laterality: Right;   HARDWARE REMOVAL Right 03/16/2016   Procedure: HARDWARE REMOVAL;  Surgeon: Fonda Olmsted, MD;  Location: [facility] OR;  Service: Orthopedics;  Laterality: Right;   INTRAMEDULLARY (IM) NAIL INTERTROCHANTERIC Left 01/26/2017   Procedure: INTRAMEDULLARY (IM) NAIL INTERTROCHANTRIC;  Surgeon: Kay CHRISTELLA Cummins, MD;  Location: [facility] OR;  Service: Orthopedics;  Laterality: Left;   JOINT REPLACEMENT     bilateral knee replacement   ORIF FEMUR FRACTURE Right 07/22/2024   Procedure: OPEN REDUCTION INTERNAL FIXATION (ORIF) DISTAL FEMUR FRACTURE;  Surgeon: Kendal Franky SQUIBB, MD;  Location: [facility] OR;  Service: Orthopedics;  Laterality: Right;   ROBOTIC ASSISTED BILATERAL SALPINGO OOPHERECTOMY Bilateral 11/02/2021   Procedure: XI ROBOTIC ASSISTED BILATERAL SALPINGO OOPHORECTOMY;  Surgeon: Viktoria Comer SAUNDERS, MD;  Location: [facility];  Service: Gynecology;  Laterality: Bilateral;   ROBOTIC  ASSISTED TOTAL HYSTERECTOMY N/A 11/02/2021   Procedure: XI ROBOTIC ASSISTED TOTAL HYSTERECTOMY WITH  MINI LAPAROTOMY;  Surgeon: Viktoria Comer SAUNDERS, MD;  Location: [facility];  Service: Gynecology;  Laterality: N/A;   SKIN CANCER EXCISION  2000's   off my nose (08/13/2013)   TOTAL HIP ARTHROPLASTY Right  03/16/2016   Procedure: TOTAL HIP ARTHROPLASTY;  Surgeon: Fonda Olmsted, MD;  Location: [facility] OR;  Service: Orthopedics;  Laterality: Right;   TOTAL HIP ARTHROPLASTY Left 06/09/2017   Procedure: REMOVE LEFT HIP INTRAMEDULLARY NAIL AND CONVERT TO LEFT CEMENTED HEMI HIP ARTHROPLASTY;  Surgeon: Jerri Kay HERO, MD;  Location: [facility];  Service: Orthopedics;  Laterality: Left;   TOTAL KNEE ARTHROPLASTY Right 2005   TOTAL KNEE ARTHROPLASTY  03/02/2012   Procedure: TOTAL KNEE ARTHROPLASTY;  Surgeon: LELON JONETTA Shari Mickey., MD;  Location: [facility];  Service: Orthopedics;  Laterality: Left;    Allergies  Allergen Reactions   Levaquin [Levofloxacin] Other (See Comments)    tendinopathy   Morphine  And Codeine Other (See Comments)    Made pt wild   Morphine  Sulfate     Other reaction(s): made pt wild    Outpatient Encounter Medications as of 07/30/2024  Medication Sig   Apoaequorin (PREVAGEN EXTRA STRENGTH) 20 MG CAPS Take 1 tablet by mouth every evening.   Cholecalciferol  2000 units CAPS Take 2,000 Units by mouth every evening.   diltiazem  (CARDIZEM  CD) 180 MG 24 hr capsule Take 180 mg by mouth daily.   ELIQUIS  2.5 MG TABS tablet TAKE ONE TABLET BY MOUTH TWICE DAILY.   levothyroxine  (SYNTHROID ) 112 MCG tablet Take 112 mcg by mouth daily before breakfast.    Multiple Vitamins-Minerals (PRESERVISION AREDS 2+MULTI VIT PO) Take 1 capsule by mouth in the morning and at bedtime.   senna (SENOKOT) 8.6 MG tablet Take 2 tablets by mouth at bedtime.   sulfamethoxazole -trimethoprim  (BACTRIM ) 400-80 MG tablet Take 1 tablet by mouth 2 (two) times daily.   acetaminophen  (TYLENOL ) 325 MG tablet Take 650 mg by mouth as needed for  moderate pain (pain score 4-6) or mild pain (pain score 1-3).   esomeprazole  (NEXIUM ) 20 MG capsule Take 20 mg by mouth as needed.   Flaxseed, Linseed, (FLAXSEED OIL) 1000 MG CAPS Take 1,000 mg by mouth 2 (two) times daily.  (Patient not taking: Reported on 07/30/2024)   furosemide  (LASIX ) 20 MG tablet Take 20 mg by mouth as needed for edema.   oxyCODONE  (OXY IR/ROXICODONE ) 5 MG immediate release tablet Take 0.5 tablets (2.5 mg total) by mouth every 6 (six) hours as needed for severe pain (pain score 7-10).   polyethylene glycol (MIRALAX  / GLYCOLAX ) 17 g packet Take 17 g by mouth daily as needed for mild constipation.   potassium chloride  (KLOR-CON  M) 10 MEQ tablet Take 10 mEq by mouth as needed. Take 1 tablet by mouth when taking 1 of the 20 mg of Lasix .   temazepam  (RESTORIL ) 30 MG capsule Take 1 capsule (30 mg total) by mouth at bedtime as needed for sleep.   No facility-administered encounter medications on file as of 07/30/2024.    Review of Systems  Constitutional:  Negative for appetite change, fatigue and fever.  HENT:  Positive for hearing loss. Negative for congestion and trouble swallowing.        Sore tongue  Eyes:        Wears glasses   Respiratory:  Negative for cough, chest tightness, shortness of breath and wheezing.   Cardiovascular:  Positive for leg swelling. Negative for chest pain and palpitations.  Gastrointestinal:  Negative for abdominal pain and constipation.       Has GERD  Genitourinary:  Negative for dysuria and frequency.  Musculoskeletal:  Positive for arthralgias and gait problem. Negative for back pain.       Has history of fracture  and replacement of right hip and left hip. Pain medication is helping with her left hip pain.   Skin:  Negative for color change.  Neurological:  Negative for syncope, light-headedness and headaches.  Hematological:  Does not bruise/bleed easily.  Psychiatric/Behavioral:  Positive for sleep disturbance. Negative for confusion. The  patient is nervous/anxious.     Immunization History  Administered Date(s) Administered   Influenza Whole 03/05/2014   Influenza,inj,Quad PF,6+ Mos 10/12/2012, 08/22/2016, 10/16/2017   Influenza-Unspecified 08/27/2013, 10/01/2020   PPD Test 10/16/2012   Pneumococcal Polysaccharide-23 11/07/2011   Unspecified SARS-COV-2 Vaccination 12/16/2019, 01/13/2020   Zoster Recombinant(Shingrix) 11/28/2017, 02/26/2018   Pertinent  Health Maintenance Due  Topic Date Due   FOOT EXAM  Never done   OPHTHALMOLOGY EXAM  Never done   DEXA SCAN  Never done   HEMOGLOBIN A1C  04/26/2022   INFLUENZA VACCINE  07/12/2024      01/19/2021   10:20 AM 09/27/2021   11:21 AM 11/02/2021    2:52 PM 11/03/2021    2:00 AM 02/28/2022   11:18 AM  Fall Risk  Falls in the past year? 0    0  Was there an injury with Fall?     0  Fall Risk Category Calculator     0  Fall Risk Category (Retired)     Low   (RETIRED) Patient Fall Risk Level Moderate fall risk  High fall risk  High fall risk  High fall risk    Patient at Risk for Falls Due to History of fall(s)      Fall risk Follow up Falls evaluation completed          Data saved with a previous flowsheet row definition   Functional Status Survey:    Vitals:   07/30/24 1237 07/31/24 1052  BP: (!) 157/77 136/65  Pulse: 99   Resp: 20   Temp: (!) 97.4 F (36.3 C)   SpO2: 95%   Weight: 127 lb 8 oz (57.8 kg)   Height: 5' 3 (1.6 m)    Body mass index is 22.59 kg/m. Physical Exam Constitutional:      Appearance: Normal appearance. She is well-developed.  HENT:     Head: Normocephalic and atraumatic.     Nose: Nose normal.     Mouth/Throat:     Mouth: Mucous membranes are moist.     Comments: Erythematous tongue.  Eyes:     Conjunctiva/sclera: Conjunctivae normal.     Pupils: Pupils are equal, round, and reactive to light.  Neck:     Thyroid : No thyromegaly.  Cardiovascular:     Rate and Rhythm: Normal rate and regular rhythm.     Heart sounds: No  murmur heard. Pulmonary:     Effort: Pulmonary effort is normal.     Breath sounds: Normal breath sounds.  Abdominal:     General: Bowel sounds are normal.     Palpations: Abdomen is soft.     Tenderness: There is no abdominal tenderness.  Musculoskeletal:        General: Tenderness present.     Cervical back: Normal range of motion and neck supple.     Right lower leg: Edema present.     Left lower leg: Edema present.     Comments: Trace edema BLE R distal femur s/p ORIF  Lymphadenopathy:     Cervical: No cervical adenopathy.  Skin:    General: Skin is warm and dry.     Findings: Erythema present.  Comments: R distal femur s/p ORIF, covered in dressing.   Neurological:     Mental Status: She is alert and oriented to person, place, and time. Mental status is at baseline.     Motor: No weakness.     Coordination: Coordination normal.     Gait: Gait abnormal.  Psychiatric:        Mood and Affect: Mood normal.        Behavior: Behavior normal.     Labs reviewed: Recent Labs    07/23/24 0622 07/24/24 0241 07/25/24 0630  NA 135 138 138  K 4.5 4.0 3.8  CL 99 97* 98  CO2 29 31 32  GLUCOSE 161* 123* 104*  BUN 17 26* 22  CREATININE 1.06* 1.22* 1.07*  CALCIUM  9.6 9.6 10.2   Recent Labs    07/21/24 0849 07/25/24 0630  AST 22 22  ALT 20 11  ALKPHOS 105 77  BILITOT 0.9 1.5*  PROT 7.2 5.8*  ALBUMIN  3.9 3.1*   Recent Labs    06/24/24 1011 07/21/24 0849 07/23/24 0622 07/23/24 0928 07/24/24 0241 07/25/24 0630  WBC 7.2 9.1   < > 16.4* 13.1* 10.6*  NEUTROABS 4,298 6.5  --   --   --   --   HGB 15.4 16.0*   < > 11.3* 9.8* 11.1*  HCT 47.7* 48.6*   < > 35.1* 29.7* 33.9*  MCV 97.7 95.9   < > 99.7 97.4 97.1  PLT 227 265   < > 191 175 221   < > = values in this interval not displayed.   Lab Results  Component Value Date   TSH 1.197 10/02/2020   Lab Results  Component Value Date   HGBA1C 5.9 (H) 10/27/2021   Lab Results  Component Value Date   CHOL 116  04/26/2013   HDL 45 04/26/2013   LDLCALC 60 04/26/2013   TRIG 56 04/26/2013   CHOLHDL 2.6 04/26/2013    Significant Diagnostic Results in last 30 days:  ECHOCARDIOGRAM COMPLETE BUBBLE STUDY Result Date: 07/23/2024    ECHOCARDIOGRAM REPORT   Patient Name:   Leah Holland Date of Exam: 07/23/2024 Medical Rec #:  [id number]        Height:       63.0 in Accession #:    [id number]       Weight:       129.9 lb Date of Birth:  20-Jul-1922        BSA:          1.609 m Patient Age:    89 years         BP:           122/57 mmHg Patient Gender: F                HR:           103 bpm. Exam Location:  Inpatient Procedure: 2D Echo, Cardiac Doppler and Color Doppler (Both Spectral and Color            Flow Doppler were utilized during procedure). Indications:    Syncope 780.2 / R55  History:        Patient has prior history of Echocardiogram examinations, most                 recent 01/18/2021. Risk Factors:Hypertension.  Sonographer:    Benard Stallion Referring Phys: [id number] SARAH A MCCLUNG IMPRESSIONS  1. Left ventricular ejection fraction, by estimation, is 65 to 70%. The left ventricle  has normal function. The left ventricle has no regional wall motion abnormalities. Left ventricular diastolic function could not be evaluated. There is the interventricular septum is flattened in systole and diastole, consistent with right ventricular pressure and volume overload.  2. Right ventricular systolic function is low normal. The right ventricular size is normal. There is moderately elevated pulmonary artery systolic pressure. The estimated right ventricular systolic pressure is 49.5 mmHg.  3. Left atrial size was severely dilated.  4. Right atrial size was severely dilated.  5. The mitral valve is degenerative. Moderate mitral valve regurgitation.  6. The tricuspid valve is abnormal. Tricuspid valve regurgitation is severe.  7. The aortic valve is tricuspid. There is mild calcification of the aortic valve. Aortic valve  regurgitation is not visualized. Aortic valve sclerosis is present, with no evidence of aortic valve stenosis.  8. The inferior vena cava is normal in size with greater than 50% respiratory variability, suggesting right atrial pressure of 3 mmHg.  9. Agitated saline contrast bubble study was negative, with no evidence of any interatrial shunt. Comparison(s): No significant change from prior study. FINDINGS  Left Ventricle: Left ventricular ejection fraction, by estimation, is 65 to 70%. The left ventricle has normal function. The left ventricle has no regional wall motion abnormalities. The left ventricular internal cavity size was normal in size. There is  no left ventricular hypertrophy. The interventricular septum is flattened in systole and diastole, consistent with right ventricular pressure and volume overload. Left ventricular diastolic function could not be evaluated due to atrial fibrillation. Left ventricular diastolic function could not be evaluated. Right Ventricle: The right ventricular size is normal. No increase in right ventricular wall thickness. Right ventricular systolic function is low normal. There is moderately elevated pulmonary artery systolic pressure. The tricuspid regurgitant velocity  is 3.41 m/s, and with an assumed right atrial pressure of 3 mmHg, the estimated right ventricular systolic pressure is 49.5 mmHg. Left Atrium: Left atrial size was severely dilated. Right Atrium: Right atrial size was severely dilated. Pericardium: There is no evidence of pericardial effusion. Mitral Valve: The mitral valve is degenerative in appearance. There is mild calcification of the anterior mitral valve leaflet(s). Mild to moderate mitral annular calcification. Moderate mitral valve regurgitation. Tricuspid Valve: The tricuspid valve is abnormal. Tricuspid valve regurgitation is severe. No evidence of tricuspid stenosis. Aortic Valve: The aortic valve is tricuspid. There is mild calcification of the  aortic valve. Aortic valve regurgitation is not visualized. Aortic valve sclerosis is present, with no evidence of aortic valve stenosis. Aortic valve mean gradient measures 5.0 mmHg. Aortic valve peak gradient measures 9.1 mmHg. Aortic valve area, by VTI measures 2.48 cm. Pulmonic Valve: The pulmonic valve was grossly normal. Pulmonic valve regurgitation is mild. No evidence of pulmonic stenosis. Aorta: The aortic root and ascending aorta are structurally normal, with no evidence of dilitation. Venous: The inferior vena cava is normal in size with greater than 50% respiratory variability, suggesting right atrial pressure of 3 mmHg. IAS/Shunts: No atrial level shunt detected by color flow Doppler. Agitated saline contrast was given intravenously to evaluate for intracardiac shunting. Agitated saline contrast bubble study was negative, with no evidence of any interatrial shunt.  LEFT VENTRICLE PLAX 2D LVIDd:         3.80 cm   Diastology LVIDs:         2.20 cm   LV e' medial:    10.70 cm/s LV PW:         1.00 cm  LV E/e' medial:  12.7 LV IVS:        1.00 cm   LV e' lateral:   17.00 cm/s LVOT diam:     2.00 cm   LV E/e' lateral: 8.0 LV SV:         67 LV SV Index:   42 LVOT Area:     3.14 cm  RIGHT VENTRICLE RV Basal diam:  3.90 cm RV Mid diam:    2.50 cm RV S prime:     15.90 cm/s TAPSE (M-mode): 2.4 cm LEFT ATRIUM             Index        RIGHT ATRIUM           Index LA diam:        4.90 cm 3.04 cm/m   RA Area:     40.20 cm LA Vol (A2C):   98.0 ml 60.89 ml/m  RA Volume:   155.00 ml 96.31 ml/m LA Vol (A4C):   60.7 ml 37.72 ml/m LA Biplane Vol: 77.7 ml 48.28 ml/m  AORTIC VALVE AV Area (Vmax):    2.43 cm AV Area (Vmean):   2.38 cm AV Area (VTI):     2.48 cm AV Vmax:           151.00 cm/s AV Vmean:          103.000 cm/s AV VTI:            0.270 m AV Peak Grad:      9.1 mmHg AV Mean Grad:      5.0 mmHg LVOT Vmax:         117.00 cm/s LVOT Vmean:        78.000 cm/s LVOT VTI:          0.213 m LVOT/AV VTI ratio:  0.79  AORTA Ao Root diam: 3.10 cm Ao Asc diam:  3.30 cm MITRAL VALVE                TRICUSPID VALVE MV Area (PHT): 4.31 cm     TR Peak grad:   46.5 mmHg MV Decel Time: 176 msec     TR Vmax:        341.00 cm/s MV E velocity: 136.00 cm/s MV A velocity: 32.80 cm/s   SHUNTS MV E/A ratio:  4.15         Systemic VTI:  0.21 m                             Systemic Diam: 2.00 cm Darryle Decent MD Electronically signed by Darryle Decent MD Signature Date/Time: 07/23/2024/10:27:40 AM    Final    DG FEMUR PORT, MIN 2 VIEWS RIGHT Result Date: 07/22/2024 CLINICAL DATA:  Femur fracture, postop. EXAM: RIGHT FEMUR PORTABLE 2 VIEW COMPARISON:  Preoperative imaging FINDINGS: Lateral plate and screw fixation of distal femoral fracture. Decreased fracture displacement from preoperative imaging. Previous hip in the arthroplasty. Recent postsurgical change includes air and edema in the soft tissues. IMPRESSION: ORIF of distal femoral fracture. Electronically Signed   By: Andrea Gasman M.D.   On: 07/22/2024 14:16   DG FEMUR, MIN 2 VIEWS RIGHT Result Date: 07/22/2024 CLINICAL DATA:  Elective surgery. EXAM: RIGHT FEMUR 2 VIEWS COMPARISON:  Preoperative imaging FINDINGS: Seven fluoroscopic spot views of the right femur submitted from the operating room. Plate and screw fixation of distal femur fracture. Previous knee and hip arthroplasty. Fluoroscopy time 64  seconds. Dose 1.91 mGy. IMPRESSION: Intraoperative fluoroscopy during distal femur fracture ORIF. Electronically Signed   By: Andrea Gasman M.D.   On: 07/22/2024 14:15   DG C-Arm 1-60 Min-No Report Result Date: 07/22/2024 Fluoroscopy was utilized by the requesting physician.  No radiographic interpretation.   EEG adult Result Date: 07/21/2024 Shelton Arlin KIDD, MD     07/21/2024  4:39 PM Patient Name: QUANESHA KLIMASZEWSKI MRN: [id number] Epilepsy Attending: Arlin KIDD Shelton Referring Physician/Provider: Georgina Basket, MD Date: 07/21/2024 Duration: 22.27 mins Patient history: 89yo  F with syncope. EEG to evaluate for seizure Level of alertness: Awake AEDs during EEG study: GBP Technical aspects: This EEG study was done with scalp electrodes positioned according to the 10-20 International system of electrode placement. Electrical activity was reviewed with band pass filter of 1-70Hz , sensitivity of 7 uV/mm, display speed of 59mm/sec with a 60Hz  notched filter applied as appropriate. EEG data were recorded continuously and digitally stored.  Video monitoring was available and reviewed as appropriate. Description: The posterior dominant rhythm consists of 8-9 Hz activity of moderate voltage (25-35 uV) seen predominantly in posterior head regions, symmetric and reactive to eye opening and eye closing. Hyperventilation and photic stimulation were not performed.   IMPRESSION: This study is within normal limits. No seizures or epileptiform discharges were seen throughout the recording. A normal interictal EEG does not exclude the diagnosis of epilepsy. Arlin KIDD Shelton   DG Chest 1 View Result Date: 07/21/2024 EXAM: 1 VIEW XRAY OF THE CHEST 07/21/2024 09:34:00 AM COMPARISON: 07/25/2017 CLINICAL HISTORY: Fall. Fell off toilet and now has right upper leg pain. FINDINGS: LUNGS AND PLEURA: No focal pulmonary opacity. No pulmonary edema. No pleural effusion. No pneumothorax. HEART AND MEDIASTINUM: Moderate cardiomegaly. Atherosclerosis in the transverse aorta. BONES AND SOFT TISSUES: No acute osseous abnormality. Skin fold over the superolateral right hemithorax. IMPRESSION: 1. No acute process. 2. Moderate cardiomegaly and atherosclerosis in the transverse aorta. Electronically signed by: Rockey Kilts MD 07/21/2024 09:58 AM EDT RP Workstation: [id number]   DG Femur Min 2 Views Right Result Date: 07/21/2024 EXAM: 2 VIEW(S) XRAY OF THE RIGHT FEMUR 07/21/2024 09:34:00 AM COMPARISON: CT Head dated 01/25/2016. CLINICAL HISTORY: Fall. Fell off toilet and now has right upper leg pain. FINDINGS: BONES AND  JOINTS: Right hip hemiarthroplasty. Total knee arthroplasty. Osteopenia. Oblique fracture of the mid to distal femoral shaft with nearly 1 shaft width of medial displacement of distal fracture fragment. Extensive overlap and angulation on the lateral view, apex anterior. No intraarticular extension. SOFT TISSUES: Vascular calcifications. IMPRESSION: 1. Oblique fracture of the mid to distal femoral shaft with nearly 1 shaft width of medial displacement of the distal fracture fragment, extensive overlap, and apex anterior angulation on the lateral view. No intraarticular extension. 2. Right hip hemiarthroplasty and total knee arthroplasty. 3. Osteopenia. Electronically signed by: Rockey Kilts MD 07/21/2024 09:56 AM EDT RP Workstation: [id number]    Assessment/Plan Thrush complaining of sore of her tongue, no noted lesion on oral lining, mild erythema of her tongue. Magic mouth wash 5ml s/s qid x 1wk.   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   Constipation prn MiraLax , Senokot  GERD (gastroesophageal reflux disease) taking Nexium   Postoperative anemia due to acute blood loss Hgb 11.1 07/25/24<11.6 07/30/24  Paroxysmal atrial fibrillation (HCC) Heart rate is controlled,  takes Eliquis  [place] vas 2 score >3, takes Diltiazem , Metoprolol   Hypothyroidism  takes Levothyroxine , TSH 1.197 10/02/20, no  recent TSH located.   Hyponatremia SIADH, hx of fluid restriction, Na 138 07/25/24>137 07/30/24  treated with Furosemide  in the past  Hypokalemia Schedule Kcl 10meq daily,  K 3.8 07/25/24>3.3 07/30/24, repeat BMP one week.   Insomnia  takes Temazepam  30mg  prn  Swelling of extremity Furosemide  prn, Chlorthalidone  25mg  qd.     Family/ staff Communication: Plan of care reviewed with the patient, the patient's son  HPOA, and the charge nurse  Labs/tests ordered: BMP one week

## 2024-07-30 NOTE — Assessment & Plan Note (Signed)
 Schedule Kcl 10meq daily,  K 3.8 07/25/24>3.3 07/30/24, repeat BMP one week.

## 2024-07-30 NOTE — Assessment & Plan Note (Signed)
 prn MiraLax , Senokot

## 2024-07-30 NOTE — Assessment & Plan Note (Signed)
 complaining of sore of her tongue, no noted lesion on oral lining, mild erythema of her tongue. Magic mouth wash 5ml s/s qid x 1wk.

## 2024-07-30 NOTE — Assessment & Plan Note (Signed)
 History of positive culture on her right total hip replacement, has been treated Bactrim  for years.

## 2024-07-30 NOTE — Assessment & Plan Note (Signed)
 Furosemide  prn, Chlorthalidone  25mg  qd.

## 2024-07-30 NOTE — Assessment & Plan Note (Signed)
 takes Levothyroxine , TSH 1.197 10/02/20, no recent TSH located.

## 2024-07-30 NOTE — Assessment & Plan Note (Signed)
 Heart rate is controlled,  takes Eliquis  [place] vas 2 score >3, takes Diltiazem , Metoprolol 

## 2024-07-30 NOTE — Assessment & Plan Note (Signed)
 taking Nexium 

## 2024-07-30 NOTE — Assessment & Plan Note (Signed)
 Hgb 11.1 07/25/24<11.6 07/30/24

## 2024-07-31 ENCOUNTER — Encounter: Payer: Self-pay | Admitting: Nurse Practitioner

## 2024-08-01 ENCOUNTER — Non-Acute Institutional Stay (SKILLED_NURSING_FACILITY): Payer: Self-pay | Admitting: Sports Medicine

## 2024-08-01 ENCOUNTER — Encounter: Payer: Self-pay | Admitting: Sports Medicine

## 2024-08-01 DIAGNOSIS — I480 Paroxysmal atrial fibrillation: Secondary | ICD-10-CM

## 2024-08-01 DIAGNOSIS — E876 Hypokalemia: Secondary | ICD-10-CM

## 2024-08-01 DIAGNOSIS — E039 Hypothyroidism, unspecified: Secondary | ICD-10-CM

## 2024-08-01 DIAGNOSIS — F5101 Primary insomnia: Secondary | ICD-10-CM | POA: Diagnosis not present

## 2024-08-01 DIAGNOSIS — S7291XS Unspecified fracture of right femur, sequela: Secondary | ICD-10-CM | POA: Diagnosis not present

## 2024-08-01 NOTE — Progress Notes (Unsigned)
 Provider:  Dr. Jackalyn Blazing Location:  [facility] Place of Service:  SNF (31)Skilled care   PCP: [name], MD (Inactive) Patient Care Team: [name], MD (Inactive) as PCP - General (Family Medicine) Court Dorn PARAS, MD as PCP - Cardiology (Cardiology) Shari Sieving, MD as Consulting Physician (Orthopedic Surgery) Court Dorn PARAS, MD as Consulting Physician (Cardiology) Camillo Golas, MD as Consulting Physician (Ophthalmology)  Extended Emergency Contact Information Primary Emergency Contact: Cira, [place]oe [place], [place] [address] [place] Home Phone: [phone] Mobile Phone: [phone] Relation: Son Secondary Emergency Contact: [name] Address: [address]          [place], [place] [address] [place] Home Phone: [phone] Mobile Phone: [phone] Relation: Son  Goals of Care: Advanced Directive information    08/01/2024   11:39 AM  Advanced Directives  Does Patient Have a Medical Advance Directive? Yes  Type of Advance Directive Healthcare Power of Attorney  Does patient want to make changes to medical advance directive? No - Patient declined  Copy of Healthcare Power of Attorney in Chart? Yes - validated most recent copy scanned in chart (See row information)      Chief Complaint  Patient presents with   New Admit To SNF    HPI: Patient is a 89 y.o. female seen today for admission to [facility].     History of Present Illness      Past Medical History:  Diagnosis Date   Anxiety attack    take RX prn (08/13/2013)   Arthritis    probably (08/13/2013)   Atrial fibrillation (HCC)    new onset/pt report 08/13/2013   Bladder wall thickening 07/01/2021   CT scan in Epic   Bronchitis 12/2011   Cholelithiasis 07/01/2021   CT in Epic.   Common bile duct dilation 07/01/2021   CT in Epic:  13 mm.   Complication of anesthesia    Compression fracture  of first lumbar vertebra (HCC) 07/01/2021   CT in Epic.   Depression    Femur fracture (HCC)    left proximal femur non-union   GERD (gastroesophageal reflux disease)    Grieving 05/03/2023   H/O hiatal hernia    Hardware complicating wound infection (HCC) 08/22/2016   History of blood transfusion    w/both knee OR's (08/13/2013)   Hyperlipidemia    Hyperlipidemia    Hypertension    Hypertensive urgency 10/22/2018   Hypothyroidism    takes synthroid    Insomnia    Intertrochanteric fracture of right hip (HCC) 07/30/2015   Neuromuscular disorder (HCC)    carpal tunnel in right hand   Periprosthetic fracture around internal prosthetic right hip joint (HCC), nonunion basicervical femoral neck with broken hardware and subtrochanteric fracture 03/16/2016   PONV (postoperative nausea and vomiting)    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 Court, cardiologist 1 x year [phone]   Tendon dysfunction 08/22/2016   Wears hearing aid    [facility]   Past Surgical History:  Procedure Laterality Date   2D ECHOCARDIOGRAM  12/07/2004   EF 48%, moderate pulmonary hypertension   BREAST BIOPSY Left ?1970's  CARDIOVASCULAR STRESS TEST  12/07/11   CARDIOVASCULAR STRESS TEST  12/07/2011   LEXISCAN , normal scan, no significant wall abnormalities noted   CARPAL TUNNEL RELEASE Right ~ 2005   CATARACT EXTRACTION, BILATERAL Bilateral 2000   CYSTOSCOPY  11/02/2021   Procedure: CYSTOSCOPY;  Surgeon: Viktoria Comer SAUNDERS, MD;  Location: [facility] ORS;  Service: Gynecology;;   DILATION AND CURETTAGE OF UTERUS  1950's   DOPPLER ECHOCARDIOGRAPHY  12/07/11   EYE SURGERY     FEMUR IM NAIL Right 08/01/2015   Procedure: INTRAMEDULLARY (IM) NAIL FEMORAL;  Surgeon:  Fonda Olmsted, MD;  Location: [facility] OR;  Service: Orthopedics;  Laterality: Right;   HARDWARE REMOVAL Right 03/16/2016   Procedure: HARDWARE REMOVAL;  Surgeon: Fonda Olmsted, MD;  Location: [facility] OR;  Service: Orthopedics;  Laterality: Right;   INTRAMEDULLARY (IM) NAIL INTERTROCHANTERIC Left 01/26/2017   Procedure: INTRAMEDULLARY (IM) NAIL INTERTROCHANTRIC;  Surgeon: Kay CHRISTELLA Cummins, MD;  Location: [facility] OR;  Service: Orthopedics;  Laterality: Left;   JOINT REPLACEMENT     bilateral knee replacement   ORIF FEMUR FRACTURE Right 07/22/2024   Procedure: OPEN REDUCTION INTERNAL FIXATION (ORIF) DISTAL FEMUR FRACTURE;  Surgeon: Kendal Franky SQUIBB, MD;  Location: [facility] OR;  Service: Orthopedics;  Laterality: Right;   ROBOTIC ASSISTED BILATERAL SALPINGO OOPHERECTOMY Bilateral 11/02/2021   Procedure: XI ROBOTIC ASSISTED BILATERAL SALPINGO OOPHORECTOMY;  Surgeon: Viktoria Comer SAUNDERS, MD;  Location: [facility];  Service: Gynecology;  Laterality: Bilateral;   ROBOTIC ASSISTED TOTAL HYSTERECTOMY N/A 11/02/2021   Procedure: XI ROBOTIC ASSISTED TOTAL HYSTERECTOMY WITH  MINI LAPAROTOMY;  Surgeon: Viktoria Comer SAUNDERS, MD;  Location: [facility];  Service: Gynecology;  Laterality: N/A;   SKIN CANCER EXCISION  2000's   off my nose (08/13/2013)   TOTAL HIP ARTHROPLASTY Right 03/16/2016   Procedure: TOTAL HIP ARTHROPLASTY;  Surgeon: Fonda Olmsted, MD;  Location: [facility] OR;  Service: Orthopedics;  Laterality: Right;   TOTAL HIP ARTHROPLASTY Left 06/09/2017   Procedure: REMOVE LEFT HIP INTRAMEDULLARY NAIL AND CONVERT TO LEFT CEMENTED HEMI HIP ARTHROPLASTY;  Surgeon: Cummins Kay CHRISTELLA, MD;  Location: [facility] OR;  Service: Orthopedics;  Laterality: Left;   TOTAL KNEE ARTHROPLASTY Right 2005   TOTAL KNEE ARTHROPLASTY  03/02/2012   Procedure: TOTAL KNEE ARTHROPLASTY;  Surgeon: LELON JONETTA Shari Mickey., MD;  Location: [facility] OR;  Service: Orthopedics;  Laterality: Left;    reports that she has never smoked. She has never used smokeless tobacco. She reports that she does not drink  alcohol and does not use drugs. Social History   Socioeconomic History   Marital status: Widowed    Spouse name: Not on file   Number of children: Not on file   Years of education: Not on file   Highest education level: Not on file  Occupational History   Not on file  Tobacco Use   Smoking status: Never   Smokeless tobacco: Never  Vaping Use   Vaping status: Never Used  Substance and Sexual Activity   Alcohol use: No   Drug use: No   Sexual activity: Not Currently  Other Topics Concern   Not on file  Social History Narrative   Widowed. Lives at [facility]. Son died in the [place] War and she has not slept well since then.   Admitted to [facility] 01/30/17   Never smoked   Alcohol none   Full Code   Social Drivers of Health   Financial Resource Strain: Not on file  Food Insecurity: No Food Insecurity (07/21/2024)  Hunger Vital Sign    Worried About Running Out of Food in the Last Year: Never true    Ran Out of Food in the Last Year: Never true  Transportation Needs: No Transportation Needs (07/21/2024)   PRAPARE - Administrator, Civil Service (Medical): No    Lack of Transportation (Non-Medical): No  Physical Activity: Not on file  Stress: Not on file  Social Connections: Unknown (07/21/2024)   Social Connection and Isolation Panel    Frequency of Communication with Friends and Family: Three times a week    Frequency of Social Gatherings with Friends and Family: Three times a week    Attends Religious Services: 1 to 4 times per year    Active Member of Clubs or Organizations: No    Attends Banker Meetings: 1 to 4 times per year    Marital Status: Not on file  Intimate Partner Violence: Not At Risk (07/21/2024)   Humiliation, Afraid, Rape, and Kick questionnaire    Fear of Current or Ex-Partner: No    Emotionally Abused: No    Physically Abused: No    Sexually Abused: No    Functional Status Survey:    Family History   Problem Relation Age of Onset   Arrhythmia Mother    Heart disease Mother    Stroke Father 83   Arrhythmia Sister    Heart disease Sister    Heart disease Sister    Stroke Sister    Diabetes Sister    Heart disease Brother    Heart disease Brother    Cancer Brother        Colon cancer   Arrhythmia Brother    Heart disease Brother    Heart disease Child 46   Heart disease Child 109   Breast cancer Neg Hx    Ovarian cancer Neg Hx    Endometrial cancer Neg Hx    Pancreatic cancer Neg Hx    Prostate cancer Neg Hx     Health Maintenance  Topic Date Due   FOOT EXAM  Never done   OPHTHALMOLOGY EXAM  Never done   DEXA SCAN  Never done   Pneumococcal Vaccine: 50+ Years (2 of 2 - PCV) 11/06/2012   COVID-19 Vaccine (3 - Mixed Product risk series) 02/10/2020   HEMOGLOBIN A1C  04/26/2022   DTaP/Tdap/Td (2 - Td or Tdap) 10/12/2022   INFLUENZA VACCINE  07/12/2024   Zoster Vaccines- Shingrix  Completed   HPV VACCINES  Aged Out   Meningococcal B Vaccine  Aged Out    Allergies  Allergen Reactions   Levaquin [Levofloxacin] Other (See Comments)    tendinopathy   Morphine  And Codeine Other (See Comments)    Made pt wild   Morphine  Sulfate     Other reaction(s): made pt wild    Outpatient Encounter Medications as of 08/01/2024  Medication Sig   Apoaequorin (PREVAGEN EXTRA STRENGTH) 20 MG CAPS Take 1 tablet by mouth every evening.   chlorthalidone  (HYGROTON ) 25 MG tablet Take 25 mg by mouth daily.   Cholecalciferol  2000 units CAPS Take 2,000 Units by mouth every evening.   diltiazem  (CARDIZEM  CD) 180 MG 24 hr capsule Take 180 mg by mouth daily.   ELIQUIS  2.5 MG TABS tablet TAKE ONE TABLET BY MOUTH TWICE DAILY.   levothyroxine  (SYNTHROID ) 112 MCG tablet Take 112 mcg by mouth daily before breakfast.    metoprolol  tartrate (LOPRESSOR ) 50 MG tablet Take 50 mg by mouth 2 (two) times daily.  Multiple Vitamins-Minerals (PRESERVISION AREDS 2+MULTI VIT PO) Take 1 capsule by mouth in the  morning and at bedtime.   senna (SENOKOT) 8.6 MG tablet Take 2 tablets by mouth at bedtime.   sulfamethoxazole -trimethoprim  (BACTRIM ) 400-80 MG tablet Take 1 tablet by mouth 2 (two) times daily.   acetaminophen  (TYLENOL ) 325 MG tablet Take 650 mg by mouth as needed for moderate pain (pain score 4-6) or mild pain (pain score 1-3).   esomeprazole  (NEXIUM ) 20 MG capsule Take 20 mg by mouth as needed.   Flaxseed, Linseed, (FLAXSEED OIL) 1000 MG CAPS Take 1,000 mg by mouth 2 (two) times daily.  (Patient not taking: Reported on 07/30/2024)   furosemide  (LASIX ) 20 MG tablet Take 20 mg by mouth as needed for edema.   oxyCODONE  (OXY IR/ROXICODONE ) 5 MG immediate release tablet Take 0.5 tablets (2.5 mg total) by mouth every 6 (six) hours as needed for severe pain (pain score 7-10).   polyethylene glycol (MIRALAX  / GLYCOLAX ) 17 g packet Take 17 g by mouth daily as needed for mild constipation.   potassium chloride  (KLOR-CON  M) 10 MEQ tablet Take 10 mEq by mouth as needed. Take 1 tablet by mouth when taking 1 of the 20 mg of Lasix .   temazepam  (RESTORIL ) 30 MG capsule Take 1 capsule (30 mg total) by mouth at bedtime as needed for sleep.   No facility-administered encounter medications on file as of 08/01/2024.    Review of Systems  Vitals:   08/01/24 1139  BP: 125/70  Pulse: 64  Resp: 20  Temp: (!) 97 F (36.1 C)  SpO2: 98%  Weight: 125 lb 14.4 oz (57.1 kg)  Height: 5' 3 (1.6 m)   Body mass index is 22.3 kg/m. BP Readings from Last 3 Encounters:  08/01/24 125/70  07/31/24 136/65  07/26/24 (!) 152/75   Wt Readings from Last 3 Encounters:  08/01/24 125 lb 14.4 oz (57.1 kg)  07/30/24 127 lb 8 oz (57.8 kg)  07/22/24 129 lb 13.6 oz (58.9 kg)   Physical Exam  Labs reviewed: Basic Metabolic Panel: Recent Labs    07/23/24 0622 07/24/24 0241 07/25/24 0630  NA 135 138 138  K 4.5 4.0 3.8  CL 99 97* 98  CO2 29 31 32  GLUCOSE 161* 123* 104*  BUN 17 26* 22  CREATININE 1.06* 1.22* 1.07*   CALCIUM  9.6 9.6 10.2   Liver Function Tests: Recent Labs    07/21/24 0849 07/25/24 0630  AST 22 22  ALT 20 11  ALKPHOS 105 77  BILITOT 0.9 1.5*  PROT 7.2 5.8*  ALBUMIN  3.9 3.1*   No results for input(s): LIPASE, AMYLASE in the last 8760 hours. No results for input(s): AMMONIA in the last 8760 hours. CBC: Recent Labs    06/24/24 1011 07/21/24 0849 07/23/24 0622 07/23/24 0928 07/24/24 0241 07/25/24 0630  WBC 7.2 9.1   < > 16.4* 13.1* 10.6*  NEUTROABS 4,298 6.5  --   --   --   --   HGB 15.4 16.0*   < > 11.3* 9.8* 11.1*  HCT 47.7* 48.6*   < > 35.1* 29.7* 33.9*  MCV 97.7 95.9   < > 99.7 97.4 97.1  PLT 227 265   < > 191 175 221   < > = values in this interval not displayed.   Cardiac Enzymes: No results for input(s): CKTOTAL, CKMB, CKMBINDEX, TROPONINI in the last 8760 hours. BNP: Invalid input(s): POCBNP Lab Results  Component Value Date   HGBA1C 5.9 (H) 10/27/2021  Lab Results  Component Value Date   TSH 1.197 10/02/2020   Lab Results  Component Value Date   VITAMINB12 512 08/03/2015   Lab Results  Component Value Date   FOLATE 20.5 08/03/2015   Lab Results  Component Value Date   IRON 16 (L) 08/03/2015   TIBC 238 (L) 08/03/2015   FERRITIN 151 08/03/2015    Imaging and Procedures obtained prior to SNF admission: DG FEMUR PORT, MIN 2 VIEWS RIGHT Result Date: 07/22/2024 CLINICAL DATA:  Femur fracture, postop. EXAM: RIGHT FEMUR PORTABLE 2 VIEW COMPARISON:  Preoperative imaging FINDINGS: Lateral plate and screw fixation of distal femoral fracture. Decreased fracture displacement from preoperative imaging. Previous hip in the arthroplasty. Recent postsurgical change includes air and edema in the soft tissues. IMPRESSION: ORIF of distal femoral fracture. Electronically Signed   By: Andrea Gasman M.D.   On: 07/22/2024 14:16   DG FEMUR, MIN 2 VIEWS RIGHT Result Date: 07/22/2024 CLINICAL DATA:  Elective surgery. EXAM: RIGHT FEMUR 2 VIEWS  COMPARISON:  Preoperative imaging FINDINGS: Seven fluoroscopic spot views of the right femur submitted from the operating room. Plate and screw fixation of distal femur fracture. Previous knee and hip arthroplasty. Fluoroscopy time 42 seconds. Dose 1.91 mGy. IMPRESSION: Intraoperative fluoroscopy during distal femur fracture ORIF. Electronically Signed   By: Andrea Gasman M.D.   On: 07/22/2024 14:15   DG C-Arm 1-60 Min-No Report Result Date: 07/22/2024 Fluoroscopy was utilized by the requesting physician.  No radiographic interpretation.   EEG adult Result Date: 07/21/2024 Shelton Arlin KIDD, MD     07/21/2024  4:39 PM Patient Name: Leah Holland MRN: [id number] Epilepsy Attending: Arlin KIDD Shelton Referring Physician/Provider: Georgina Basket, MD Date: 07/21/2024 Duration: 22.27 mins Patient history: 89yo F with syncope. EEG to evaluate for seizure Level of alertness: Awake AEDs during EEG study: GBP Technical aspects: This EEG study was done with scalp electrodes positioned according to the 10-20 International system of electrode placement. Electrical activity was reviewed with band pass filter of 1-70Hz , sensitivity of 7 uV/mm, display speed of 39mm/sec with a 60Hz  notched filter applied as appropriate. EEG data were recorded continuously and digitally stored.  Video monitoring was available and reviewed as appropriate. Description: The posterior dominant rhythm consists of 8-9 Hz activity of moderate voltage (25-35 uV) seen predominantly in posterior head regions, symmetric and reactive to eye opening and eye closing. Hyperventilation and photic stimulation were not performed.   IMPRESSION: This study is within normal limits. No seizures or epileptiform discharges were seen throughout the recording. A normal interictal EEG does not exclude the diagnosis of epilepsy. Arlin KIDD Shelton   DG Chest 1 View Result Date: 07/21/2024 EXAM: 1 VIEW XRAY OF THE CHEST 07/21/2024 09:34:00 AM COMPARISON: 07/25/2017  CLINICAL HISTORY: Fall. Fell off toilet and now has right upper leg pain. FINDINGS: LUNGS AND PLEURA: No focal pulmonary opacity. No pulmonary edema. No pleural effusion. No pneumothorax. HEART AND MEDIASTINUM: Moderate cardiomegaly. Atherosclerosis in the transverse aorta. BONES AND SOFT TISSUES: No acute osseous abnormality. Skin fold over the superolateral right hemithorax. IMPRESSION: 1. No acute process. 2. Moderate cardiomegaly and atherosclerosis in the transverse aorta. Electronically signed by: Rockey Kilts MD 07/21/2024 09:58 AM EDT RP Workstation: [id number]   DG Femur Min 2 Views Right Result Date: 07/21/2024 EXAM: 2 VIEW(S) XRAY OF THE RIGHT FEMUR 07/21/2024 09:34:00 AM COMPARISON: CT Head dated 01/25/2016. CLINICAL HISTORY: Fall. Fell off toilet and now has right upper leg pain. FINDINGS: BONES AND JOINTS: Right hip hemiarthroplasty. Total knee  arthroplasty. Osteopenia. Oblique fracture of the mid to distal femoral shaft with nearly 1 shaft width of medial displacement of distal fracture fragment. Extensive overlap and angulation on the lateral view, apex anterior. No intraarticular extension. SOFT TISSUES: Vascular calcifications. IMPRESSION: 1. Oblique fracture of the mid to distal femoral shaft with nearly 1 shaft width of medial displacement of the distal fracture fragment, extensive overlap, and apex anterior angulation on the lateral view. No intraarticular extension. 2. Right hip hemiarthroplasty and total knee arthroplasty. 3. Osteopenia. Electronically signed by: Rockey Kilts MD 07/21/2024 09:56 AM EDT RP Workstation: [id number]    Assessment and Plan Assessment & Plan       Family/ staff Communication:   Labs/tests ordered:  Evertt ONEIDA Pereyra

## 2024-08-02 ENCOUNTER — Encounter: Payer: Self-pay | Admitting: Sports Medicine

## 2024-08-08 LAB — COMPREHENSIVE METABOLIC PANEL WITH GFR
Calcium: 10.4 (ref 8.7–10.7)
eGFR: 43

## 2024-08-08 LAB — BASIC METABOLIC PANEL WITH GFR
BUN: 24 — AB (ref 4–21)
CO2: 33 — AB (ref 13–22)
Chloride: 98 — AB (ref 99–108)
Creatinine: 1.1 (ref 0.5–1.1)
Glucose: 94
Potassium: 3.6 meq/L (ref 3.5–5.1)
Sodium: 137 (ref 137–147)

## 2024-08-09 ENCOUNTER — Encounter: Payer: Self-pay | Admitting: Nurse Practitioner

## 2024-08-09 DIAGNOSIS — N183 Chronic kidney disease, stage 3 unspecified: Secondary | ICD-10-CM | POA: Insufficient documentation

## 2024-08-13 ENCOUNTER — Non-Acute Institutional Stay (SKILLED_NURSING_FACILITY): Payer: Self-pay | Admitting: Nurse Practitioner

## 2024-08-13 ENCOUNTER — Encounter: Payer: Self-pay | Admitting: Nurse Practitioner

## 2024-08-13 DIAGNOSIS — T8459XS Infection and inflammatory reaction due to other internal joint prosthesis, sequela: Secondary | ICD-10-CM

## 2024-08-13 DIAGNOSIS — I480 Paroxysmal atrial fibrillation: Secondary | ICD-10-CM

## 2024-08-13 DIAGNOSIS — K5900 Constipation, unspecified: Secondary | ICD-10-CM

## 2024-08-13 DIAGNOSIS — Z66 Do not resuscitate: Secondary | ICD-10-CM

## 2024-08-13 DIAGNOSIS — G4700 Insomnia, unspecified: Secondary | ICD-10-CM

## 2024-08-13 DIAGNOSIS — M1990 Unspecified osteoarthritis, unspecified site: Secondary | ICD-10-CM | POA: Diagnosis not present

## 2024-08-13 DIAGNOSIS — K219 Gastro-esophageal reflux disease without esophagitis: Secondary | ICD-10-CM

## 2024-08-13 DIAGNOSIS — R600 Localized edema: Secondary | ICD-10-CM

## 2024-08-13 DIAGNOSIS — M7989 Other specified soft tissue disorders: Secondary | ICD-10-CM | POA: Diagnosis not present

## 2024-08-13 DIAGNOSIS — E039 Hypothyroidism, unspecified: Secondary | ICD-10-CM

## 2024-08-13 DIAGNOSIS — E871 Hypo-osmolality and hyponatremia: Secondary | ICD-10-CM

## 2024-08-13 DIAGNOSIS — Z96649 Presence of unspecified artificial hip joint: Secondary | ICD-10-CM

## 2024-08-13 DIAGNOSIS — D500 Iron deficiency anemia secondary to blood loss (chronic): Secondary | ICD-10-CM | POA: Insufficient documentation

## 2024-08-13 NOTE — Assessment & Plan Note (Signed)
 takes Levothyroxine , TSH 4.22 07/30/24

## 2024-08-13 NOTE — Progress Notes (Signed)
 Location:  [facility] Room Number: [phone] [id number] Place of Service:  SNF (31) Provider:  [facility] Keo Schirmer, N.P.  Patient Care Team: [name], MD (Inactive) as PCP - General (Family Medicine) Court Dorn PARAS, MD as PCP - Cardiology (Cardiology) Shari Sieving, MD as Consulting Physician (Orthopedic Surgery) Court Dorn PARAS, MD as Consulting Physician (Cardiology) Camillo Golas, MD as Consulting Physician (Ophthalmology)  Extended Emergency Contact Information Primary Emergency Contact: Kamara, [place], [place] [address] [place] Home Phone: [phone] Mobile Phone: [phone] Relation: Son Secondary Emergency Contact: [name] Address: [address]          [place], [place] [address] [place] Home Phone: [phone] Mobile Phone: [phone] Relation: Son  Code Status: DNR Goals of care: Advanced Directive information    08/01/2024   11:39 AM  Advanced Directives  Does Patient Have a Medical Advance Directive? Yes  Type of Advance Directive Healthcare Power of Attorney  Does patient want to make changes to medical advance directive? No - Patient declined  Copy of Healthcare Power of Attorney in Chart? Yes - validated most recent copy scanned in chart (See row information)     Chief Complaint  Patient presents with   Leg Swelling    Right lower extremity swelling     HPI:  Pt is a 89 y.o. female seen today for an acute visit for increased edema in BLE, mostly in the right lower extremity, no redness, warmth, or pain in calfs or with right and left foot dorsiflexion.  Weight gain about #3 pounds in the past 2 weeks.  Denied cough, shortness of breath, chest pressure, palpitation, or GI symptoms.  Hospitalized 07/21/2024 to 07/25/2024 distal right femur fracture, status post ORIF 07/22/24, WBAT, ambulating with walker, follow-up Dr. Kendal, prn Oxycodone , Tylenol , Gabapentin              OA  s/p R+L knee replacement, bil hip prosthesis             History of positive culture on her right total hip replacement, has been treated Bactrim  for years.              Constipation, prn MiraLax , Senokot             GERD, taking Nexium              Post op anemia, Hgb 11.1 07/25/24<11.6 07/30/24              Afib, takes Eliquis  [place] vas 2 score >3, takes Diltiazem , Metoprolol              Hypothyroidism, takes Levothyroxine , TSH 4.22 07/30/24             Hyponatremia, SIADH, hx of fluid restriction, Na 137 08/08/24,  treated with Furosemide  in the past             Hypokalemia, takes Kcl, K 3.6 08/08/24             Insomnia, takes Temazepam  30mg  prn             Edema BLE, Furosemide  prn, Chlorthalidone  25mg  qd.             Depression, off Duloxetine              OP, off Prolia , taking  Vit D, Vit D 70.72 07/23/24               Past Medical  History:  Diagnosis Date   Anxiety attack    take RX prn (08/13/2013)   Arthritis    probably (08/13/2013)   Atrial fibrillation (HCC)    new onset/pt report 08/13/2013   Bladder wall thickening 07/01/2021   CT scan in Epic   Bronchitis 12/2011   Cholelithiasis 07/01/2021   CT in Epic.   Common bile duct dilation 07/01/2021   CT in Epic:  13 mm.   Complication of anesthesia    Compression fracture of first lumbar vertebra (HCC) 07/01/2021   CT in Epic.   Depression    Femur fracture (HCC)    left proximal femur non-union   GERD (gastroesophageal reflux disease)    Grieving 05/03/2023   H/O hiatal hernia    Hardware complicating wound infection (HCC) 08/22/2016   History of blood transfusion    w/both knee OR's (08/13/2013)   Hyperlipidemia    Hyperlipidemia    Hypertension    Hypertensive urgency 10/22/2018   Hypothyroidism    takes synthroid    Insomnia    Intertrochanteric fracture of right hip (HCC) 07/30/2015   Neuromuscular disorder (HCC)    carpal tunnel in right hand   Periprosthetic fracture around internal prosthetic right hip joint  (HCC), nonunion basicervical femoral neck with broken hardware and subtrochanteric fracture 03/16/2016   PONV (postoperative nausea and vomiting)    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 Court, cardiologist 1 x year [phone]   Tendon dysfunction 08/22/2016   Wears hearing aid    [facility]   Past Surgical History:  Procedure Laterality Date   2D ECHOCARDIOGRAM  12/07/2004   EF 48%, moderate pulmonary hypertension   BREAST BIOPSY Left ?1970's   CARDIOVASCULAR STRESS TEST  12/07/11   CARDIOVASCULAR STRESS TEST  12/07/2011   LEXISCAN , normal scan, no significant wall abnormalities noted   CARPAL TUNNEL RELEASE Right ~ 2005   CATARACT EXTRACTION, BILATERAL Bilateral 2000   CYSTOSCOPY  11/02/2021   Procedure: CYSTOSCOPY;  Surgeon: Viktoria Comer SAUNDERS, MD;  Location: [facility];  Service: Gynecology;;   DILATION AND CURETTAGE OF UTERUS  1950's   DOPPLER ECHOCARDIOGRAPHY  12/07/11   EYE SURGERY     FEMUR IM NAIL Right 08/01/2015   Procedure: INTRAMEDULLARY (IM) NAIL FEMORAL;  Surgeon: Fonda Olmsted, MD;  Location: [facility] OR;  Service: Orthopedics;  Laterality: Right;   HARDWARE REMOVAL Right 03/16/2016   Procedure: HARDWARE REMOVAL;  Surgeon: Fonda Olmsted, MD;  Location: [facility] OR;  Service: Orthopedics;  Laterality: Right;   INTRAMEDULLARY (IM) NAIL INTERTROCHANTERIC Left 01/26/2017   Procedure: INTRAMEDULLARY (IM) NAIL INTERTROCHANTRIC;  Surgeon: Kay CHRISTELLA Cummins, MD;  Location: [facility] OR;  Service: Orthopedics;  Laterality: Left;   JOINT REPLACEMENT     bilateral knee replacement   ORIF FEMUR FRACTURE Right 07/22/2024   Procedure: OPEN REDUCTION INTERNAL FIXATION (ORIF) DISTAL FEMUR FRACTURE;  Surgeon: Kendal Franky SQUIBB, MD;  Location: [facility] OR;  Service:  Orthopedics;  Laterality: Right;   ROBOTIC ASSISTED BILATERAL SALPINGO OOPHERECTOMY Bilateral 11/02/2021   Procedure: XI ROBOTIC ASSISTED BILATERAL SALPINGO OOPHORECTOMY;  Surgeon: Viktoria Comer SAUNDERS, MD;  Location: [facility];  Service: Gynecology;  Laterality: Bilateral;   ROBOTIC ASSISTED TOTAL HYSTERECTOMY N/A 11/02/2021   Procedure: XI  ROBOTIC ASSISTED TOTAL HYSTERECTOMY WITH  MINI LAPAROTOMY;  Surgeon: Viktoria Comer SAUNDERS, MD;  Location: [facility];  Service: Gynecology;  Laterality: N/A;   SKIN CANCER EXCISION  2000's   off my nose (08/13/2013)   TOTAL HIP ARTHROPLASTY Right 03/16/2016   Procedure: TOTAL HIP ARTHROPLASTY;  Surgeon: Fonda Olmsted, MD;  Location: [facility] OR;  Service: Orthopedics;  Laterality: Right;   TOTAL HIP ARTHROPLASTY Left 06/09/2017   Procedure: REMOVE LEFT HIP INTRAMEDULLARY NAIL AND CONVERT TO LEFT CEMENTED HEMI HIP ARTHROPLASTY;  Surgeon: Jerri Kay HERO, MD;  Location: [facility] OR;  Service: Orthopedics;  Laterality: Left;   TOTAL KNEE ARTHROPLASTY Right 2005   TOTAL KNEE ARTHROPLASTY  03/02/2012   Procedure: TOTAL KNEE ARTHROPLASTY;  Surgeon: LELON JONETTA Shari Mickey., MD;  Location: [facility] OR;  Service: Orthopedics;  Laterality: Left;    Allergies  Allergen Reactions   Levaquin [Levofloxacin] Other (See Comments)    tendinopathy   Morphine  And Codeine Other (See Comments)    Made pt wild   Morphine  Sulfate     Other reaction(s): made pt wild    Outpatient Encounter Medications as of 08/13/2024  Medication Sig   chlorthalidone  (HYGROTON ) 25 MG tablet Take 25 mg by mouth daily.   Cholecalciferol  2000 units CAPS Take 2,000 Units by mouth every evening.   diltiazem  (CARDIZEM  CD) 180 MG 24 hr capsule Take 180 mg by mouth daily.   ELIQUIS  2.5 MG TABS tablet TAKE ONE TABLET BY MOUTH TWICE DAILY.   esomeprazole  (NEXIUM ) 20 MG capsule Take 20 mg by mouth as needed.   furosemide  (LASIX ) 20 MG tablet Take 20 mg by mouth as needed for edema.   lactose free nutrition (BOOST) LIQD Take 237 mLs by  mouth daily.   levothyroxine  (SYNTHROID ) 112 MCG tablet Take 112 mcg by mouth daily before breakfast.    metoprolol  tartrate (LOPRESSOR ) 50 MG tablet Take 50 mg by mouth 2 (two) times daily.   Multiple Vitamins-Minerals (PRESERVISION AREDS 2+MULTI VIT PO) Take 1 capsule by mouth in the morning and at bedtime.   oxyCODONE  (OXY IR/ROXICODONE ) 5 MG immediate release tablet Take 0.5 tablets (2.5 mg total) by mouth every 6 (six) hours as needed for severe pain (pain score 7-10).   polyethylene glycol (MIRALAX  / GLYCOLAX ) 17 g packet Take 17 g by mouth daily as needed for mild constipation.   polyethylene glycol (MIRALAX  / GLYCOLAX ) 17 g packet Take 17 g by mouth daily. See as needed dosing also   potassium chloride  (KLOR-CON  M) 10 MEQ tablet Take 10 mEq by mouth as needed. And take 1 tablet by mouth when taking 1 of the 20 mg of Lasix .   senna (SENOKOT) 8.6 MG tablet Take 2 tablets by mouth at bedtime.   sulfamethoxazole -trimethoprim  (BACTRIM ) 400-80 MG tablet Take 1 tablet by mouth 2 (two) times daily.   Apoaequorin (PREVAGEN EXTRA STRENGTH) 20 MG CAPS Take 1 tablet by mouth every evening.   [DISCONTINUED] acetaminophen  (TYLENOL ) 325 MG tablet Take 650 mg by mouth as needed for moderate pain (pain score 4-6) or mild pain (pain score 1-3). (Patient not taking: Reported on 08/13/2024)   [DISCONTINUED] Flaxseed, Linseed, (FLAXSEED OIL) 1000 MG CAPS Take 1,000 mg by mouth 2 (two) times daily.  (Patient not taking: Reported on 08/13/2024)   [DISCONTINUED] temazepam  (RESTORIL ) 30 MG capsule Take 1 capsule (30 mg total) by mouth at bedtime as needed for sleep. (Patient not taking: Reported on 08/13/2024)   No facility-administered encounter medications on file as of 08/13/2024.  Review of Systems  Constitutional:  Negative for appetite change, fatigue and fever.  HENT:  Positive for hearing loss. Negative for congestion and trouble swallowing.   Eyes:        Wears glasses   Respiratory:  Negative for cough,  chest tightness, shortness of breath and wheezing.   Cardiovascular:  Positive for leg swelling. Negative for chest pain and palpitations.  Gastrointestinal:  Negative for abdominal pain and constipation.       Has GERD  Genitourinary:  Negative for dysuria and frequency.  Musculoskeletal:  Positive for arthralgias and gait problem. Negative for back pain.       Has history of fracture and replacement of right hip and left hip. Pain medication is helping with her left hip pain.   Skin:  Negative for color change.  Neurological:  Negative for syncope, light-headedness and headaches.  Hematological:  Does not bruise/bleed easily.  Psychiatric/Behavioral:  Positive for sleep disturbance. Negative for confusion. The patient is nervous/anxious.     Immunization History  Administered Date(s) Administered   Fluzone  Influenza virus vaccine,trivalent (IIV3), split virus 10/12/2012, 08/27/2013   Influenza Whole 03/05/2014   Influenza,inj,Quad PF,6+ Mos 10/12/2012, 08/22/2016, 10/16/2017   Influenza-Unspecified 08/27/2013, 10/01/2020   PPD Test 10/16/2012   Pneumococcal Polysaccharide-23 11/07/2011   Tdap 10/12/2012   Unspecified SARS-COV-2 Vaccination 12/16/2019, 01/13/2020   Zoster Recombinant(Shingrix) 11/28/2017, 02/26/2018   Pertinent  Health Maintenance Due  Topic Date Due   FOOT EXAM  Never done   OPHTHALMOLOGY EXAM  Never done   DEXA SCAN  Never done   HEMOGLOBIN A1C  04/26/2022   INFLUENZA VACCINE  07/12/2024      01/19/2021   10:20 AM 09/27/2021   11:21 AM 11/02/2021    2:52 PM 11/03/2021    2:00 AM 02/28/2022   11:18 AM  Fall Risk  Falls in the past year? 0    0  Was there an injury with Fall?     0  Fall Risk Category Calculator     0  Fall Risk Category (Retired)     Low   (RETIRED) Patient Fall Risk Level Moderate fall risk  High fall risk  High fall risk  High fall risk    Patient at Risk for Falls Due to History of fall(s)      Fall risk Follow up Falls evaluation  completed          Data saved with a previous flowsheet row definition   Functional Status Survey:    Vitals:   08/13/24 1324  BP: 132/65  Pulse: 67  Resp: 20  Temp: (!) 97.4 F (36.3 C)  SpO2: 96%  Weight: 128 lb (58.1 kg)  Height: 5' 3 (1.6 m)   Body mass index is 22.67 kg/m. Physical Exam Constitutional:      Appearance: Normal appearance. She is well-developed.  HENT:     Head: Normocephalic and atraumatic.     Nose: Nose normal.     Mouth/Throat:     Mouth: Mucous membranes are moist.  Eyes:     Conjunctiva/sclera: Conjunctivae normal.     Pupils: Pupils are equal, round, and reactive to light.  Neck:     Thyroid : No thyromegaly.  Cardiovascular:     Rate and Rhythm: Normal rate and regular rhythm.     Heart sounds: No murmur heard. Pulmonary:     Effort: Pulmonary effort is normal.     Breath sounds: Normal breath sounds.  Abdominal:     General:  Bowel sounds are normal.     Palpations: Abdomen is soft.     Tenderness: There is no abdominal tenderness.  Musculoskeletal:        General: Tenderness present.     Cervical back: Normal range of motion and neck supple.     Right lower leg: Edema present.     Left lower leg: Edema present.     Comments: Trace edema LLE, 2+ edema RLE, no redness, warmth, or tenderness in calves and with R+L foot dorsiflexion.  R distal femur s/p ORIF  Lymphadenopathy:     Cervical: No cervical adenopathy.  Skin:    General: Skin is warm and dry.     Comments: R distal femur s/p ORIF surgical scar  Neurological:     Mental Status: She is alert and oriented to person, place, and time. Mental status is at baseline.     Motor: No weakness.     Coordination: Coordination normal.     Gait: Gait abnormal.  Psychiatric:        Mood and Affect: Mood normal.        Behavior: Behavior normal.     Labs reviewed: Recent Labs    07/23/24 0622 07/24/24 0241 07/25/24 0630 08/08/24 0000  NA 135 138 138 137  K 4.5 4.0 3.8 3.6   CL 99 97* 98 98*  CO2 29 31 32 33*  GLUCOSE 161* 123* 104*  --   BUN 17 26* 22 24*  CREATININE 1.06* 1.22* 1.07* 1.1  CALCIUM  9.6 9.6 10.2 10.4   Recent Labs    07/21/24 0849 07/25/24 0630  AST 22 22  ALT 20 11  ALKPHOS 105 77  BILITOT 0.9 1.5*  PROT 7.2 5.8*  ALBUMIN  3.9 3.1*   Recent Labs    06/24/24 1011 07/21/24 0849 07/23/24 0622 07/23/24 0928 07/24/24 0241 07/25/24 0630  WBC 7.2 9.1   < > 16.4* 13.1* 10.6*  NEUTROABS 4,298 6.5  --   --   --   --   HGB 15.4 16.0*   < > 11.3* 9.8* 11.1*  HCT 47.7* 48.6*   < > 35.1* 29.7* 33.9*  MCV 97.7 95.9   < > 99.7 97.4 97.1  PLT 227 265   < > 191 175 221   < > = values in this interval not displayed.   Lab Results  Component Value Date   TSH 1.197 10/02/2020   Lab Results  Component Value Date   HGBA1C 5.9 (H) 10/27/2021   Lab Results  Component Value Date   CHOL 116 04/26/2013   HDL 45 04/26/2013   LDLCALC 60 04/26/2013   TRIG 56 04/26/2013   CHOLHDL 2.6 04/26/2013    Significant Diagnostic Results in last 30 days:  ECHOCARDIOGRAM COMPLETE BUBBLE STUDY Result Date: 07/23/2024    ECHOCARDIOGRAM REPORT   Patient Name:   Leah Holland Date of Exam: 07/23/2024 Medical Rec #:  [id number]        Height:       63.0 in Accession #:    [id number]       Weight:       129.9 lb Date of Birth:  August 10, 1922        BSA:          1.609 m Patient Age:    89 years         BP:           122/57 mmHg Patient Gender: F  HR:           103 bpm. Exam Location:  Inpatient Procedure: 2D Echo, Cardiac Doppler and Color Doppler (Both Spectral and Color            Flow Doppler were utilized during procedure). Indications:    Syncope 780.2 / R55  History:        Patient has prior history of Echocardiogram examinations, most                 recent 01/18/2021. Risk Factors:Hypertension.  Sonographer:    Benard Stallion Referring Phys: [id number] SARAH A MCCLUNG IMPRESSIONS  1. Left ventricular ejection fraction, by estimation, is 65 to 70%.  The left ventricle has normal function. The left ventricle has no regional wall motion abnormalities. Left ventricular diastolic function could not be evaluated. There is the interventricular septum is flattened in systole and diastole, consistent with right ventricular pressure and volume overload.  2. Right ventricular systolic function is low normal. The right ventricular size is normal. There is moderately elevated pulmonary artery systolic pressure. The estimated right ventricular systolic pressure is 49.5 mmHg.  3. Left atrial size was severely dilated.  4. Right atrial size was severely dilated.  5. The mitral valve is degenerative. Moderate mitral valve regurgitation.  6. The tricuspid valve is abnormal. Tricuspid valve regurgitation is severe.  7. The aortic valve is tricuspid. There is mild calcification of the aortic valve. Aortic valve regurgitation is not visualized. Aortic valve sclerosis is present, with no evidence of aortic valve stenosis.  8. The inferior vena cava is normal in size with greater than 50% respiratory variability, suggesting right atrial pressure of 3 mmHg.  9. Agitated saline contrast bubble study was negative, with no evidence of any interatrial shunt. Comparison(s): No significant change from prior study. FINDINGS  Left Ventricle: Left ventricular ejection fraction, by estimation, is 65 to 70%. The left ventricle has normal function. The left ventricle has no regional wall motion abnormalities. The left ventricular internal cavity size was normal in size. There is  no left ventricular hypertrophy. The interventricular septum is flattened in systole and diastole, consistent with right ventricular pressure and volume overload. Left ventricular diastolic function could not be evaluated due to atrial fibrillation. Left ventricular diastolic function could not be evaluated. Right Ventricle: The right ventricular size is normal. No increase in right ventricular wall thickness. Right  ventricular systolic function is low normal. There is moderately elevated pulmonary artery systolic pressure. The tricuspid regurgitant velocity  is 3.41 m/s, and with an assumed right atrial pressure of 3 mmHg, the estimated right ventricular systolic pressure is 49.5 mmHg. Left Atrium: Left atrial size was severely dilated. Right Atrium: Right atrial size was severely dilated. Pericardium: There is no evidence of pericardial effusion. Mitral Valve: The mitral valve is degenerative in appearance. There is mild calcification of the anterior mitral valve leaflet(s). Mild to moderate mitral annular calcification. Moderate mitral valve regurgitation. Tricuspid Valve: The tricuspid valve is abnormal. Tricuspid valve regurgitation is severe. No evidence of tricuspid stenosis. Aortic Valve: The aortic valve is tricuspid. There is mild calcification of the aortic valve. Aortic valve regurgitation is not visualized. Aortic valve sclerosis is present, with no evidence of aortic valve stenosis. Aortic valve mean gradient measures 5.0 mmHg. Aortic valve peak gradient measures 9.1 mmHg. Aortic valve area, by VTI measures 2.48 cm. Pulmonic Valve: The pulmonic valve was grossly normal. Pulmonic valve regurgitation is mild. No evidence of pulmonic stenosis. Aorta: The aortic root and ascending  aorta are structurally normal, with no evidence of dilitation. Venous: The inferior vena cava is normal in size with greater than 50% respiratory variability, suggesting right atrial pressure of 3 mmHg. IAS/Shunts: No atrial level shunt detected by color flow Doppler. Agitated saline contrast was given intravenously to evaluate for intracardiac shunting. Agitated saline contrast bubble study was negative, with no evidence of any interatrial shunt.  LEFT VENTRICLE PLAX 2D LVIDd:         3.80 cm   Diastology LVIDs:         2.20 cm   LV e' medial:    10.70 cm/s LV PW:         1.00 cm   LV E/e' medial:  12.7 LV IVS:        1.00 cm   LV e'  lateral:   17.00 cm/s LVOT diam:     2.00 cm   LV E/e' lateral: 8.0 LV SV:         67 LV SV Index:   42 LVOT Area:     3.14 cm  RIGHT VENTRICLE RV Basal diam:  3.90 cm RV Mid diam:    2.50 cm RV S prime:     15.90 cm/s TAPSE (M-mode): 2.4 cm LEFT ATRIUM             Index        RIGHT ATRIUM           Index LA diam:        4.90 cm 3.04 cm/m   RA Area:     40.20 cm LA Vol (A2C):   98.0 ml 60.89 ml/m  RA Volume:   155.00 ml 96.31 ml/m LA Vol (A4C):   60.7 ml 37.72 ml/m LA Biplane Vol: 77.7 ml 48.28 ml/m  AORTIC VALVE AV Area (Vmax):    2.43 cm AV Area (Vmean):   2.38 cm AV Area (VTI):     2.48 cm AV Vmax:           151.00 cm/s AV Vmean:          103.000 cm/s AV VTI:            0.270 m AV Peak Grad:      9.1 mmHg AV Mean Grad:      5.0 mmHg LVOT Vmax:         117.00 cm/s LVOT Vmean:        78.000 cm/s LVOT VTI:          0.213 m LVOT/AV VTI ratio: 0.79  AORTA Ao Root diam: 3.10 cm Ao Asc diam:  3.30 cm MITRAL VALVE                TRICUSPID VALVE MV Area (PHT): 4.31 cm     TR Peak grad:   46.5 mmHg MV Decel Time: 176 msec     TR Vmax:        341.00 cm/s MV E velocity: 136.00 cm/s MV A velocity: 32.80 cm/s   SHUNTS MV E/A ratio:  4.15         Systemic VTI:  0.21 m                             Systemic Diam: 2.00 cm Leah Decent MD Electronically signed by Leah Decent MD Signature Date/Time: 07/23/2024/10:27:40 AM    Final    DG FEMUR PORT, MIN 2 VIEWS RIGHT Result Date: 07/22/2024 CLINICAL DATA:  Femur fracture, postop. EXAM: RIGHT FEMUR PORTABLE 2 VIEW COMPARISON:  Preoperative imaging FINDINGS: Lateral plate and screw fixation of distal femoral fracture. Decreased fracture displacement from preoperative imaging. Previous hip in the arthroplasty. Recent postsurgical change includes air and edema in the soft tissues. IMPRESSION: ORIF of distal femoral fracture. Electronically Signed   By: Andrea Gasman M.D.   On: 07/22/2024 14:16   DG FEMUR, MIN 2 VIEWS RIGHT Result Date: 07/22/2024 CLINICAL DATA:   Elective surgery. EXAM: RIGHT FEMUR 2 VIEWS COMPARISON:  Preoperative imaging FINDINGS: Seven fluoroscopic spot views of the right femur submitted from the operating room. Plate and screw fixation of distal femur fracture. Previous knee and hip arthroplasty. Fluoroscopy time 42 seconds. Dose 1.91 mGy. IMPRESSION: Intraoperative fluoroscopy during distal femur fracture ORIF. Electronically Signed   By: Andrea Gasman M.D.   On: 07/22/2024 14:15   DG C-Arm 1-60 Min-No Report Result Date: 07/22/2024 Fluoroscopy was utilized by the requesting physician.  No radiographic interpretation.   EEG adult Result Date: 07/21/2024 Shelton Arlin KIDD, MD     07/21/2024  4:39 PM Patient Name: Leah Holland MRN: [id number] Epilepsy Attending: Arlin KIDD Shelton Referring Physician/Provider: Georgina Basket, MD Date: 07/21/2024 Duration: 22.27 mins Patient history: 89yo F with syncope. EEG to evaluate for seizure Level of alertness: Awake AEDs during EEG study: GBP Technical aspects: This EEG study was done with scalp electrodes positioned according to the 10-20 International system of electrode placement. Electrical activity was reviewed with band pass filter of 1-70Hz , sensitivity of 7 uV/mm, display speed of 85mm/sec with a 60Hz  notched filter applied as appropriate. EEG data were recorded continuously and digitally stored.  Video monitoring was available and reviewed as appropriate. Description: The posterior dominant rhythm consists of 8-9 Hz activity of moderate voltage (25-35 uV) seen predominantly in posterior head regions, symmetric and reactive to eye opening and eye closing. Hyperventilation and photic stimulation were not performed.   IMPRESSION: This study is within normal limits. No seizures or epileptiform discharges were seen throughout the recording. A normal interictal EEG does not exclude the diagnosis of epilepsy. Arlin KIDD Shelton   DG Chest 1 View Result Date: 07/21/2024 EXAM: 1 VIEW XRAY OF THE CHEST  07/21/2024 09:34:00 AM COMPARISON: 07/25/2017 CLINICAL HISTORY: Fall. Fell off toilet and now has right upper leg pain. FINDINGS: LUNGS AND PLEURA: No focal pulmonary opacity. No pulmonary edema. No pleural effusion. No pneumothorax. HEART AND MEDIASTINUM: Moderate cardiomegaly. Atherosclerosis in the transverse aorta. BONES AND SOFT TISSUES: No acute osseous abnormality. Skin fold over the superolateral right hemithorax. IMPRESSION: 1. No acute process. 2. Moderate cardiomegaly and atherosclerosis in the transverse aorta. Electronically signed by: Rockey Kilts MD 07/21/2024 09:58 AM EDT RP Workstation: [id number]   DG Femur Min 2 Views Right Result Date: 07/21/2024 EXAM: 2 VIEW(S) XRAY OF THE RIGHT FEMUR 07/21/2024 09:34:00 AM COMPARISON: CT Head dated 01/25/2016. CLINICAL HISTORY: Fall. Fell off toilet and now has right upper leg pain. FINDINGS: BONES AND JOINTS: Right hip hemiarthroplasty. Total knee arthroplasty. Osteopenia. Oblique fracture of the mid to distal femoral shaft with nearly 1 shaft width of medial displacement of distal fracture fragment. Extensive overlap and angulation on the lateral view, apex anterior. No intraarticular extension. SOFT TISSUES: Vascular calcifications. IMPRESSION: 1. Oblique fracture of the mid to distal femoral shaft with nearly 1 shaft width of medial displacement of the distal fracture fragment, extensive overlap, and apex anterior angulation on the lateral view. No intraarticular extension. 2. Right hip hemiarthroplasty and total knee arthroplasty. 3.  Osteopenia. Electronically signed by: Rockey Kilts MD 07/21/2024 09:56 AM EDT RP Workstation: [id number]    Assessment/Plan Edema, peripheral increased edema in BLE, mostly in the right lower extremity, no redness, warmth, or pain in calfs or with right and left foot dorsiflexion.  Weight gain about #3 pounds in the past 2 weeks.  Denied cough, shortness of breath, chest pressure, palpitation, or GI symptoms.  Swelling  of extremity  increased edema in BLE, mostly in the right lower extremity, no redness, warmth, or pain in calfs or with right and left foot dorsiflexion.  Weight gain about #3 pounds in the past 2 weeks.  Denied cough, shortness of breath, chest pressure, palpitation, or GI symptoms. Use as needed furosemide , observe the patient  Arthritis s/p R+L knee replacement, bil hip prosthesis, continue therapy Hospitalized 07/21/2024 to 07/25/2024 distal right femur fracture, status post ORIF 07/22/24, WBAT, ambulating with walker, follow-up Dr. Kendal, prn Oxycodone , Tylenol , Gabapentin   Prosthetic hip infection (HCC) History of positive culture on her right total hip replacement, has been treated Bactrim  for years.   Constipation Stable, prn MiraLax , Senokot  GERD (gastroesophageal reflux disease) Stable, taking Nexium   Blood loss anemia Post op anemia, Hgb 11.1 07/25/24<11.6 07/30/24  Paroxysmal atrial fibrillation (HCC) Heart rate is controlled,  takes Eliquis  [place] vas 2 score >3, takes Diltiazem , Metoprolol   Hypothyroidism  takes Levothyroxine , TSH 4.22 07/30/24  Hyponatremia SIADH, hx of fluid restriction, Na 137 08/08/24,  treated with Furosemide  in the past  Insomnia takes Temazepam  30mg  prn     Family/ staff Communication: Plan of care reviewed with the patient and charge nurse  Labs/tests ordered: None

## 2024-08-13 NOTE — Assessment & Plan Note (Signed)
 SIADH, hx of fluid restriction, Na 137 08/08/24,  treated with Furosemide  in the past

## 2024-08-13 NOTE — Assessment & Plan Note (Signed)
 Heart rate is controlled,  takes Eliquis  [place] vas 2 score >3, takes Diltiazem , Metoprolol 

## 2024-08-13 NOTE — Assessment & Plan Note (Signed)
 Post op anemia, Hgb 11.1 07/25/24<11.6 07/30/24

## 2024-08-13 NOTE — Assessment & Plan Note (Signed)
 Stable, prn MiraLax , Senokot

## 2024-08-13 NOTE — Assessment & Plan Note (Signed)
 increased edema in BLE, mostly in the right lower extremity, no redness, warmth, or pain in calfs or with right and left foot dorsiflexion.  Weight gain about #3 pounds in the past 2 weeks.  Denied cough, shortness of breath, chest pressure, palpitation, or GI symptoms.

## 2024-08-13 NOTE — Assessment & Plan Note (Signed)
 increased edema in BLE, mostly in the right lower extremity, no redness, warmth, or pain in calfs or with right and left foot dorsiflexion.  Weight gain about #3 pounds in the past 2 weeks.  Denied cough, shortness of breath, chest pressure, palpitation, or GI symptoms. Use as needed furosemide , observe the patient

## 2024-08-13 NOTE — Assessment & Plan Note (Signed)
 History of positive culture on her right total hip replacement, has been treated Bactrim  for years.

## 2024-08-13 NOTE — Assessment & Plan Note (Signed)
 s/p R+L knee replacement, bil hip prosthesis, continue therapy Hospitalized 07/21/2024 to 07/25/2024 distal right femur fracture, status post ORIF 07/22/24, WBAT, ambulating with walker, follow-up Dr. Kendal, prn Oxycodone , Tylenol , Gabapentin 

## 2024-08-13 NOTE — Assessment & Plan Note (Signed)
 takes Temazepam  30mg  prn

## 2024-08-13 NOTE — Assessment & Plan Note (Signed)
 Stable, taking Nexium 

## 2024-08-20 ENCOUNTER — Non-Acute Institutional Stay (SKILLED_NURSING_FACILITY): Payer: Self-pay | Admitting: Nurse Practitioner

## 2024-08-20 ENCOUNTER — Encounter: Payer: Self-pay | Admitting: Nurse Practitioner

## 2024-08-20 DIAGNOSIS — M25561 Pain in right knee: Secondary | ICD-10-CM | POA: Diagnosis not present

## 2024-08-20 DIAGNOSIS — I480 Paroxysmal atrial fibrillation: Secondary | ICD-10-CM

## 2024-08-20 DIAGNOSIS — K219 Gastro-esophageal reflux disease without esophagitis: Secondary | ICD-10-CM

## 2024-08-20 DIAGNOSIS — E039 Hypothyroidism, unspecified: Secondary | ICD-10-CM

## 2024-08-20 DIAGNOSIS — M7989 Other specified soft tissue disorders: Secondary | ICD-10-CM

## 2024-08-20 DIAGNOSIS — I10 Essential (primary) hypertension: Secondary | ICD-10-CM

## 2024-08-20 NOTE — Assessment & Plan Note (Signed)
 Heart rate is in control,  takes Eliquis  [place] vas 2 score >3, takes Diltiazem , Metoprolol 

## 2024-08-20 NOTE — Progress Notes (Signed)
 Location:   [facility] Room Number: W942 [id number] Place of Service:  SNF (31) Provider: Larwance Keiarah Orlowski NP  [name], MD (Inactive)  Patient Care Team: [name], MD (Inactive) as PCP - General (Family Medicine) Court Dorn PARAS, MD as PCP - Cardiology (Cardiology) Shari Sieving, MD as Consulting Physician (Orthopedic Surgery) Court Dorn PARAS, MD as Consulting Physician (Cardiology) Camillo Golas, MD as Consulting Physician (Ophthalmology)  Extended Emergency Contact Information Primary Emergency Contact: Jenney, Brester [place], [place] [address] [place] Home Phone: [phone] Mobile Phone: [phone] Relation: Son Secondary Emergency Contact: [name] Address: [address]          [place], [place] [address] [place] Home Phone: [phone] Mobile Phone: [phone] Relation: Son  Code Status: DNR Goals of care: Advanced Directive information    08/01/2024   11:39 AM  Advanced Directives  Does Patient Have a Medical Advance Directive? Yes  Type of Advance Directive Healthcare Power of Attorney  Does patient want to make changes to medical advance directive? No - Patient declined  Copy of Healthcare Power of Attorney in Chart? Yes - validated most recent copy scanned in chart (See row information)     Chief Complaint  Patient presents with   Leg Swelling    HPI:  Pt is a 89 y.o. female seen today for an acute visit for right knee pain with weightbearing, swelling, mild warmth.  No pain in the right calf or with the right foot dorsiflexion.  Persistent right leg swelling from middle of the right thigh to the right foot.  Hospitalized 07/21/2024 to 07/25/2024 distal right femur fracture, status post ORIF 07/22/24, WBAT, ambulating with walker, follow-up Dr. Kendal, prn Oxycodone , Tylenol , Gabapentin              OA s/p R+L knee replacement, bil hip prosthesis             History of positive culture on  her right total hip replacement, has been treated Bactrim  for years.              Constipation, prn MiraLax , Senokot             GERD, taking Nexium              Post op anemia, Hgb 11.1 07/25/24<11.6 07/30/24              Afib, takes Eliquis  [place] vas 2 score >3, takes Diltiazem , Metoprolol              Hypothyroidism, takes Levothyroxine , TSH 4.22 07/30/24             Hyponatremia, SIADH, hx of fluid restriction, Na 137 08/08/24,  treated with Furosemide  in the past             Hypokalemia, takes Kcl, K 3.6 08/08/24             Insomnia, takes Temazepam  30mg  prn             Edema BLE, Furosemide  prn, Chlorthalidone  25mg  qd.             Depression, off Duloxetine              OP, off Prolia , taking  Vit D, Vit D 70.72 07/23/24               Past Medical History:  Diagnosis Date   Anxiety attack    take RX prn (  08/13/2013)   Arthritis    probably (08/13/2013)   Atrial fibrillation (HCC)    new onset/pt report 08/13/2013   Bladder wall thickening 07/01/2021   CT scan in Epic   Bronchitis 12/2011   Cholelithiasis 07/01/2021   CT in Epic.   Common bile duct dilation 07/01/2021   CT in Epic:  13 mm.   Complication of anesthesia    Compression fracture of first lumbar vertebra (HCC) 07/01/2021   CT in Epic.   Depression    Femur fracture (HCC)    left proximal femur non-union   GERD (gastroesophageal reflux disease)    Grieving 05/03/2023   H/O hiatal hernia    Hardware complicating wound infection (HCC) 08/22/2016   History of blood transfusion    w/both knee OR's (08/13/2013)   Hyperlipidemia    Hyperlipidemia    Hypertension    Hypertensive urgency 10/22/2018   Hypothyroidism    takes synthroid    Insomnia    Intertrochanteric fracture of right hip (HCC) 07/30/2015   Neuromuscular disorder (HCC)    carpal tunnel in right hand   Periprosthetic fracture around internal prosthetic right hip joint (HCC), nonunion basicervical femoral neck with broken hardware and subtrochanteric  fracture 03/16/2016   PONV (postoperative nausea and vomiting)    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 Court, cardiologist 1 x year [phone]   Tendon dysfunction 08/22/2016   Wears hearing aid    [facility]   Past Surgical History:  Procedure Laterality Date   2D ECHOCARDIOGRAM  12/07/2004   EF 48%, moderate pulmonary hypertension   BREAST BIOPSY Left ?1970's   CARDIOVASCULAR STRESS TEST  12/07/11   CARDIOVASCULAR STRESS TEST  12/07/2011   LEXISCAN , normal scan, no significant wall abnormalities noted   CARPAL TUNNEL RELEASE Right ~ 2005   CATARACT EXTRACTION, BILATERAL Bilateral 2000   CYSTOSCOPY  11/02/2021   Procedure: CYSTOSCOPY;  Surgeon: Viktoria Comer SAUNDERS, MD;  Location: [facility];  Service: Gynecology;;   DILATION AND CURETTAGE OF UTERUS  1950's   DOPPLER ECHOCARDIOGRAPHY  12/07/11   EYE SURGERY     FEMUR IM NAIL Right 08/01/2015   Procedure: INTRAMEDULLARY (IM) NAIL FEMORAL;  Surgeon: Fonda Olmsted, MD;  Location: [facility] OR;  Service: Orthopedics;  Laterality: Right;   HARDWARE REMOVAL Right 03/16/2016   Procedure: HARDWARE REMOVAL;  Surgeon: Fonda Olmsted, MD;  Location: [facility];  Service: Orthopedics;  Laterality: Right;   INTRAMEDULLARY (IM) NAIL INTERTROCHANTERIC Left 01/26/2017   Procedure: INTRAMEDULLARY (IM) NAIL INTERTROCHANTRIC;  Surgeon: Kay CHRISTELLA Cummins, MD;  Location: [facility] OR;  Service: Orthopedics;  Laterality: Left;   JOINT REPLACEMENT     bilateral knee replacement   ORIF FEMUR FRACTURE Right 07/22/2024   Procedure: OPEN REDUCTION INTERNAL FIXATION (ORIF) DISTAL FEMUR FRACTURE;  Surgeon: Kendal Franky SQUIBB, MD;  Location: [facility] OR;  Service: Orthopedics;  Laterality: Right;   ROBOTIC ASSISTED BILATERAL SALPINGO OOPHERECTOMY  Bilateral 11/02/2021   Procedure: XI ROBOTIC ASSISTED BILATERAL SALPINGO OOPHORECTOMY;  Surgeon: Viktoria Comer SAUNDERS, MD;  Location: [facility];  Service: Gynecology;  Laterality: Bilateral;   ROBOTIC ASSISTED TOTAL HYSTERECTOMY N/A 11/02/2021   Procedure: XI ROBOTIC ASSISTED TOTAL HYSTERECTOMY WITH  MINI LAPAROTOMY;  Surgeon: Viktoria Comer SAUNDERS, MD;  Location: [facility];  Service: Gynecology;  Laterality: N/A;   SKIN CANCER EXCISION  2000's   off my nose (08/13/2013)   TOTAL HIP ARTHROPLASTY Right 03/16/2016   Procedure: TOTAL HIP ARTHROPLASTY;  Surgeon: Fonda Olmsted, MD;  Location: [facility];  Service: Orthopedics;  Laterality: Right;   TOTAL HIP ARTHROPLASTY Left 06/09/2017   Procedure: REMOVE LEFT HIP INTRAMEDULLARY NAIL AND CONVERT TO LEFT CEMENTED HEMI HIP ARTHROPLASTY;  Surgeon: Jerri Kay HERO, MD;  Location: [facility];  Service: Orthopedics;  Laterality: Left;   TOTAL KNEE ARTHROPLASTY Right 2005   TOTAL KNEE ARTHROPLASTY  03/02/2012   Procedure: TOTAL KNEE ARTHROPLASTY;  Surgeon: LELON JONETTA Shari Mickey., MD;  Location: [facility];  Service: Orthopedics;  Laterality: Left;    Allergies  Allergen Reactions   Levaquin [Levofloxacin] Other (See Comments)    tendinopathy   Morphine  And Codeine Other (See Comments)    Made pt wild   Morphine  Sulfate     Other reaction(s): made pt wild    Allergies as of 08/20/2024       Reactions   Levaquin [levofloxacin] Other (See Comments)   tendinopathy   Morphine  And Codeine Other (See Comments)   Made pt wild   Morphine  Sulfate    Other reaction(s): made pt wild        Medication List        Accurate as of August 20, 2024 11:59 PM. If you have any questions, ask your nurse or doctor.          chlorthalidone  25 MG tablet Commonly known as: HYGROTON  Take 25 mg by mouth daily.   Cholecalciferol  50 MCG (2000 UT) Caps Take 2,000 Units by mouth every evening.   diltiazem  180 MG 24 hr capsule Commonly known as: CARDIZEM  CD Take 180 mg by mouth  daily.   Eliquis  2.5 MG Tabs tablet Generic drug: apixaban  TAKE ONE TABLET BY MOUTH TWICE DAILY.   esomeprazole  20 MG capsule Commonly known as: NEXIUM  Take 20 mg by mouth as needed.   furosemide  20 MG tablet Commonly known as: LASIX  Take 20 mg by mouth as needed for edema.   lactose free nutrition Liqd Take 237 mLs by mouth daily.   levothyroxine  112 MCG tablet Commonly known as: SYNTHROID  Take 112 mcg by mouth daily before breakfast.   metoprolol  tartrate 50 MG tablet Commonly known as: LOPRESSOR  Take 50 mg by mouth 2 (two) times daily.   oxyCODONE  5 MG immediate release tablet Commonly known as: Oxy IR/ROXICODONE  Take 0.5 tablets (2.5 mg total) by mouth every 6 (six) hours as needed for severe pain (pain score 7-10).   polyethylene glycol 17 g packet Commonly known as: MIRALAX  / GLYCOLAX  Take 17 g by mouth daily. See as needed dosing also   polyethylene glycol 17 g packet Commonly known as: MIRALAX  / GLYCOLAX  Take 17 g by mouth daily as needed for mild constipation.   potassium chloride  10 MEQ tablet Commonly known as: KLOR-CON  M Take 10 mEq by mouth as needed. And take 1 tablet by mouth when taking 1 of the 20 mg of Lasix .   PRESERVISION AREDS 2+MULTI VIT PO Take 1 capsule by mouth in the morning and at bedtime.   Prevagen Extra Strength 20 MG Caps Generic drug: Apoaequorin Take 1 tablet by mouth every evening.   senna 8.6 MG tablet Commonly known as: SENOKOT Take 2 tablets by mouth at bedtime.   sulfamethoxazole -trimethoprim  400-80 MG tablet Commonly known as: BACTRIM  Take 1 tablet by mouth 2 (two)  times daily.   temazepam  30 MG capsule Commonly known as: RESTORIL  Take 30 mg by mouth at bedtime as needed for sleep.        Review of Systems  Constitutional:  Negative for appetite change, fatigue and fever.  HENT:  Positive for hearing loss. Negative for congestion and trouble swallowing.   Eyes:        Wears glasses   Respiratory:  Negative for  cough, chest tightness, shortness of breath and wheezing.   Cardiovascular:  Positive for leg swelling. Negative for chest pain and palpitations.  Gastrointestinal:  Negative for abdominal pain and constipation.       Has GERD  Genitourinary:  Negative for dysuria and frequency.  Musculoskeletal:  Positive for arthralgias and gait problem. Negative for back pain.       Has history of fracture and replacement of right hip and left hip. R knee pain  Skin:        Bruises right knee  Neurological:  Negative for syncope, light-headedness and headaches.  Hematological:  Does not bruise/bleed easily.  Psychiatric/Behavioral:  Positive for sleep disturbance. Negative for confusion. The patient is nervous/anxious.     Immunization History  Administered Date(s) Administered   Fluzone  Influenza virus vaccine,trivalent (IIV3), split virus 10/12/2012, 08/27/2013   Influenza Whole 03/05/2014   Influenza,inj,Quad PF,6+ Mos 10/12/2012, 08/22/2016, 10/16/2017   Influenza-Unspecified 08/27/2013, 10/01/2020   PPD Test 10/16/2012   Pneumococcal Polysaccharide-23 11/07/2011   Tdap 10/12/2012   Unspecified SARS-COV-2 Vaccination 12/16/2019, 01/13/2020   Zoster Recombinant(Shingrix) 11/28/2017, 02/26/2018   Pertinent  Health Maintenance Due  Topic Date Due   FOOT EXAM  Never done   OPHTHALMOLOGY EXAM  Never done   DEXA SCAN  Never done   HEMOGLOBIN A1C  04/26/2022   Influenza Vaccine  07/12/2024      01/19/2021   10:20 AM 09/27/2021   11:21 AM 11/02/2021    2:52 PM 11/03/2021    2:00 AM 02/28/2022   11:18 AM  Fall Risk  Falls in the past year? 0    0  Was there an injury with Fall?     0  Fall Risk Category Calculator     0  Fall Risk Category (Retired)     Low   (RETIRED) Patient Fall Risk Level Moderate fall risk  High fall risk  High fall risk  High fall risk    Patient at Risk for Falls Due to History of fall(s)      Fall risk Follow up Falls evaluation completed          Data saved with  a previous flowsheet row definition   Functional Status Survey:    Vitals:   08/20/24 1604  BP: (!) 143/65  Pulse: 92  Resp: 18  Temp: (!) 97 F (36.1 C)  SpO2: 98%  Weight: 128 lb 6.4 oz (58.2 kg)  Height: 5' 3 (1.6 m)   Body mass index is 22.75 kg/m. Physical Exam Constitutional:      Appearance: Normal appearance. She is well-developed.  HENT:     Head: Normocephalic and atraumatic.     Nose: Nose normal.     Mouth/Throat:     Mouth: Mucous membranes are moist.  Eyes:     Conjunctiva/sclera: Conjunctivae normal.     Pupils: Pupils are equal, round, and reactive to light.  Neck:     Thyroid : No thyromegaly.  Cardiovascular:     Rate and Rhythm: Normal rate and regular rhythm.  Heart sounds: No murmur heard. Pulmonary:     Effort: Pulmonary effort is normal.     Breath sounds: Normal breath sounds.  Abdominal:     General: Bowel sounds are normal.     Palpations: Abdomen is soft.     Tenderness: There is no abdominal tenderness.  Musculoskeletal:        General: Tenderness present.     Cervical back: Normal range of motion and neck supple.     Right lower leg: Edema present.     Left lower leg: Edema present.     Comments: Trace edema LLE, 2+ edema RLE, redness, warmth, and tenderness in the right knee, no pain in calves and with R+L foot dorsiflexion.  R distal femur s/p ORIF, residual ecchymosis right knee, inner right thigh.   Lymphadenopathy:     Cervical: No cervical adenopathy.  Skin:    General: Skin is warm and dry.     Comments: R distal femur s/p ORIF surgical scar  Neurological:     Mental Status: She is alert and oriented to person, place, and time. Mental status is at baseline.     Motor: No weakness.     Coordination: Coordination normal.     Gait: Gait abnormal.  Psychiatric:        Mood and Affect: Mood normal.        Behavior: Behavior normal.     Labs reviewed: Recent Labs    07/23/24 0622 07/24/24 0241 07/25/24 0630  08/08/24 0000  NA 135 138 138 137  K 4.5 4.0 3.8 3.6  CL 99 97* 98 98*  CO2 29 31 32 33*  GLUCOSE 161* 123* 104*  --   BUN 17 26* 22 24*  CREATININE 1.06* 1.22* 1.07* 1.1  CALCIUM  9.6 9.6 10.2 10.4   Recent Labs    07/21/24 0849 07/25/24 0630  AST 22 22  ALT 20 11  ALKPHOS 105 77  BILITOT 0.9 1.5*  PROT 7.2 5.8*  ALBUMIN  3.9 3.1*   Recent Labs    06/24/24 1011 07/21/24 0849 07/23/24 0622 07/23/24 0928 07/24/24 0241 07/25/24 0630  WBC 7.2 9.1   < > 16.4* 13.1* 10.6*  NEUTROABS 4,298 6.5  --   --   --   --   HGB 15.4 16.0*   < > 11.3* 9.8* 11.1*  HCT 47.7* 48.6*   < > 35.1* 29.7* 33.9*  MCV 97.7 95.9   < > 99.7 97.4 97.1  PLT 227 265   < > 191 175 221   < > = values in this interval not displayed.   Lab Results  Component Value Date   TSH 1.197 10/02/2020   Lab Results  Component Value Date   HGBA1C 5.9 (H) 10/27/2021   Lab Results  Component Value Date   CHOL 116 04/26/2013   HDL 45 04/26/2013   LDLCALC 60 04/26/2013   TRIG 56 04/26/2013   CHOLHDL 2.6 04/26/2013    Significant Diagnostic Results in last 30 days:  No results found.   Assessment/Plan: Right knee pain   The right knee pain with weightbearing, swelling, mild warmth.  No pain in the right calf or with the right foot dorsiflexion.  Persistent right leg swelling from middle of the right thigh to the right foot.  Hospitalized 07/21/2024 to 07/25/2024 distal right femur fracture, status post ORIF 07/22/24, WBAT, ambulating with walker, follow-up Dr. Kendal, prn Oxycodone , Tylenol , Gabapentin   OA s/p R+L knee replacement, bil hip prosthesis             History of positive culture on her right total hip replacement, has been treated Bactrim  for years.   The patient declined x-ray, venous ultrasound, or further workup.  She desires to follow-up with her orthopedic 07/22/2024  Continue to observe  HPOA: agreed to Xray, venous US , CBC, ESR  GERD (gastroesophageal reflux disease) Stable,  taking Nexium   Blood loss anemia Hgb 11.1 07/25/24<11.6 07/30/24  Paroxysmal atrial fibrillation (HCC) Heart rate is in control, takes Eliquis  [place] vas 2 score >3, takes Diltiazem , Metoprolol   Hypothyroidism  takes Levothyroxine , TSH 4.22 07/30/24  Swelling of extremity  Furosemide  prn, Chlorthalidone  25mg  qd.    Family/ staff Communication: Plan of care reviewed with the patient and charge nurse  Labs/tests ordered: None

## 2024-08-20 NOTE — Assessment & Plan Note (Signed)
 takes Levothyroxine , TSH 4.22 07/30/24

## 2024-08-20 NOTE — Assessment & Plan Note (Signed)
 Hgb 11.1 07/25/24<11.6 07/30/24

## 2024-08-20 NOTE — Assessment & Plan Note (Signed)
 The right knee pain with weightbearing, swelling, mild warmth.  No pain in the right calf or with the right foot dorsiflexion.  Persistent right leg swelling from middle of the right thigh to the right foot.  Hospitalized 07/21/2024 to 07/25/2024 distal right femur fracture, status post ORIF 07/22/24, WBAT, ambulating with walker, follow-up Dr. Kendal, prn Oxycodone , Tylenol , Gabapentin              OA s/p R+L knee replacement, bil hip prosthesis             History of positive culture on her right total hip replacement, has been treated Bactrim  for years.   The patient declined x-ray, venous ultrasound, or further workup.  She desires to follow-up with her orthopedic 07/22/2024  Continue to observe  HPOA: agreed to Xray, venous US , CBC, ESR

## 2024-08-20 NOTE — Assessment & Plan Note (Signed)
 Stable, taking Nexium 

## 2024-08-20 NOTE — Progress Notes (Signed)
 Location:  [facility] Room Number: [phone] A Place of Service:  SNF (31) Provider: Shaneisha Burkel X, NP   Patient Care Team: [name], MD (Inactive) as PCP - General (Family Medicine) Court Dorn PARAS, MD as PCP - Cardiology (Cardiology) Shari Sieving, MD as Consulting Physician (Orthopedic Surgery) Court Dorn PARAS, MD as Consulting Physician (Cardiology) Camillo Golas, MD as Consulting Physician (Ophthalmology)  Extended Emergency Contact Information Primary Emergency Contact: Cala, [place], [place] [address] [place] Home Phone: [phone] Mobile Phone: [phone] Relation: Son Secondary Emergency Contact: [name] Address: [address]          [place], [place] [address] [place] Home Phone: [phone] Mobile Phone: [phone] Relation: Son  Code Status:  DNR Goals of care: Advanced Directive information    08/01/2024   11:39 AM  Advanced Directives  Does Patient Have a Medical Advance Directive? Yes  Type of Advance Directive Healthcare Power of Attorney  Does patient want to make changes to medical advance directive? No - Patient declined  Copy of Healthcare Power of Attorney in Chart? Yes - validated most recent copy scanned in chart (See row information)     Chief Complaint  Patient presents with   Leg Swelling    HPI:  Pt is a 89 y.o. female seen today for an acute visit for    Past Medical History:  Diagnosis Date   Anxiety attack    take RX prn (08/13/2013)   Arthritis    probably (08/13/2013)   Atrial fibrillation (HCC)    new onset/pt report 08/13/2013   Bladder wall thickening 07/01/2021   CT scan in Epic   Bronchitis 12/2011   Cholelithiasis 07/01/2021   CT in Epic.   Common bile duct dilation 07/01/2021   CT in Epic:  13 mm.   Complication of anesthesia    Compression fracture of first lumbar vertebra (HCC) 07/01/2021   CT in Epic.   Depression     Femur fracture (HCC)    left proximal femur non-union   GERD (gastroesophageal reflux disease)    Grieving 05/03/2023   H/O hiatal hernia    Hardware complicating wound infection (HCC) 08/22/2016   History of blood transfusion    w/both knee OR's (08/13/2013)   Hyperlipidemia    Hyperlipidemia    Hypertension    Hypertensive urgency 10/22/2018   Hypothyroidism    takes synthroid    Insomnia    Intertrochanteric fracture of right hip (HCC) 07/30/2015   Neuromuscular disorder (HCC)    carpal tunnel in right hand   Periprosthetic fracture around internal prosthetic right hip joint (HCC), nonunion basicervical femoral neck with broken hardware and subtrochanteric fracture 03/16/2016   PONV (postoperative nausea and vomiting)    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 Court, cardiologist 1 x year [phone]   Tendon dysfunction 08/22/2016   Wears hearing aid    [facility]   Past Surgical History:  Procedure Laterality Date   2D ECHOCARDIOGRAM  12/07/2004   EF 48%, moderate pulmonary hypertension   BREAST BIOPSY Left ?1970's   CARDIOVASCULAR STRESS TEST  12/07/11   CARDIOVASCULAR STRESS  TEST  12/07/2011   LEXISCAN , normal scan, no significant wall abnormalities noted   CARPAL TUNNEL RELEASE Right ~ 2005   CATARACT EXTRACTION, BILATERAL Bilateral 2000   CYSTOSCOPY  11/02/2021   Procedure: CYSTOSCOPY;  Surgeon: Viktoria Comer SAUNDERS, MD;  Location: [facility];  Service: Gynecology;;   DILATION AND CURETTAGE OF UTERUS  1950's   DOPPLER ECHOCARDIOGRAPHY  12/07/11   EYE SURGERY     FEMUR IM NAIL Right 08/01/2015   Procedure: INTRAMEDULLARY (IM) NAIL FEMORAL;  Surgeon: Fonda Olmsted, MD;  Location: [facility] OR;  Service: Orthopedics;  Laterality: Right;    HARDWARE REMOVAL Right 03/16/2016   Procedure: HARDWARE REMOVAL;  Surgeon: Fonda Olmsted, MD;  Location: [facility] OR;  Service: Orthopedics;  Laterality: Right;   INTRAMEDULLARY (IM) NAIL INTERTROCHANTERIC Left 01/26/2017   Procedure: INTRAMEDULLARY (IM) NAIL INTERTROCHANTRIC;  Surgeon: Kay CHRISTELLA Cummins, MD;  Location: [facility] OR;  Service: Orthopedics;  Laterality: Left;   JOINT REPLACEMENT     bilateral knee replacement   ORIF FEMUR FRACTURE Right 07/22/2024   Procedure: OPEN REDUCTION INTERNAL FIXATION (ORIF) DISTAL FEMUR FRACTURE;  Surgeon: Kendal Franky SQUIBB, MD;  Location: [facility] OR;  Service: Orthopedics;  Laterality: Right;   ROBOTIC ASSISTED BILATERAL SALPINGO OOPHERECTOMY Bilateral 11/02/2021   Procedure: XI ROBOTIC ASSISTED BILATERAL SALPINGO OOPHORECTOMY;  Surgeon: Viktoria Comer SAUNDERS, MD;  Location: [facility];  Service: Gynecology;  Laterality: Bilateral;   ROBOTIC ASSISTED TOTAL HYSTERECTOMY N/A 11/02/2021   Procedure: XI ROBOTIC ASSISTED TOTAL HYSTERECTOMY WITH  MINI LAPAROTOMY;  Surgeon: Viktoria Comer SAUNDERS, MD;  Location: [facility];  Service: Gynecology;  Laterality: N/A;   SKIN CANCER EXCISION  2000's   off my nose (08/13/2013)   TOTAL HIP ARTHROPLASTY Right 03/16/2016   Procedure: TOTAL HIP ARTHROPLASTY;  Surgeon: Fonda Olmsted, MD;  Location: [facility] OR;  Service: Orthopedics;  Laterality: Right;   TOTAL HIP ARTHROPLASTY Left 06/09/2017   Procedure: REMOVE LEFT HIP INTRAMEDULLARY NAIL AND CONVERT TO LEFT CEMENTED HEMI HIP ARTHROPLASTY;  Surgeon: Cummins Kay CHRISTELLA, MD;  Location: [facility] OR;  Service: Orthopedics;  Laterality: Left;   TOTAL KNEE ARTHROPLASTY Right 2005   TOTAL KNEE ARTHROPLASTY  03/02/2012   Procedure: TOTAL KNEE ARTHROPLASTY;  Surgeon: LELON JONETTA Shari Mickey., MD;  Location: [facility] OR;  Service: Orthopedics;  Laterality: Left;    Allergies  Allergen Reactions   Levaquin [Levofloxacin] Other (See Comments)    tendinopathy   Morphine  And Codeine Other (See Comments)    Made pt wild   Morphine  Sulfate     Other  reaction(s): made pt wild    Outpatient Encounter Medications as of 08/20/2024  Medication Sig   Apoaequorin (PREVAGEN EXTRA STRENGTH) 20 MG CAPS Take 1 tablet by mouth every evening.   chlorthalidone  (HYGROTON ) 25 MG tablet Take 25 mg by mouth daily.   Cholecalciferol  2000 units CAPS Take 2,000 Units by mouth every evening.   diltiazem  (CARDIZEM  CD) 180 MG 24 hr capsule Take 180 mg by mouth daily.   ELIQUIS  2.5 MG TABS tablet TAKE ONE TABLET BY MOUTH TWICE DAILY.   esomeprazole  (NEXIUM ) 20 MG capsule Take 20 mg by mouth as needed.   furosemide  (LASIX ) 20 MG tablet Take 20 mg by mouth as needed for edema.   lactose free nutrition (BOOST) LIQD Take 237 mLs by mouth daily.   levothyroxine  (SYNTHROID ) 112 MCG tablet Take 112 mcg by mouth daily before breakfast.    metoprolol  tartrate (LOPRESSOR ) 50 MG tablet Take 50 mg by mouth 2 (two) times daily.   Multiple Vitamins-Minerals (  PRESERVISION AREDS 2+MULTI VIT PO) Take 1 capsule by mouth in the morning and at bedtime.   oxyCODONE  (OXY IR/ROXICODONE ) 5 MG immediate release tablet Take 0.5 tablets (2.5 mg total) by mouth every 6 (six) hours as needed for severe pain (pain score 7-10).   polyethylene glycol (MIRALAX  / GLYCOLAX ) 17 g packet Take 17 g by mouth daily as needed for mild constipation.   polyethylene glycol (MIRALAX  / GLYCOLAX ) 17 g packet Take 17 g by mouth daily. See as needed dosing also   potassium chloride  (KLOR-CON  M) 10 MEQ tablet Take 10 mEq by mouth as needed. And take 1 tablet by mouth when taking 1 of the 20 mg of Lasix .   senna (SENOKOT) 8.6 MG tablet Take 2 tablets by mouth at bedtime.   sulfamethoxazole -trimethoprim  (BACTRIM ) 400-80 MG tablet Take 1 tablet by mouth 2 (two) times daily.   temazepam  (RESTORIL ) 30 MG capsule Take 30 mg by mouth at bedtime as needed for sleep.   No facility-administered encounter medications on file as of 08/20/2024.    Review of Systems  Immunization History  Administered Date(s) Administered    Fluzone  Influenza virus vaccine,trivalent (IIV3), split virus 10/12/2012, 08/27/2013   Influenza Whole 03/05/2014   Influenza,inj,Quad PF,6+ Mos 10/12/2012, 08/22/2016, 10/16/2017   Influenza-Unspecified 08/27/2013, 10/01/2020   PPD Test 10/16/2012   Pneumococcal Polysaccharide-23 11/07/2011   Tdap 10/12/2012   Unspecified SARS-COV-2 Vaccination 12/16/2019, 01/13/2020   Zoster Recombinant(Shingrix) 11/28/2017, 02/26/2018   Pertinent  Health Maintenance Due  Topic Date Due   FOOT EXAM  Never done   OPHTHALMOLOGY EXAM  Never done   DEXA SCAN  Never done   HEMOGLOBIN A1C  04/26/2022   Influenza Vaccine  07/12/2024      01/19/2021   10:20 AM 09/27/2021   11:21 AM 11/02/2021    2:52 PM 11/03/2021    2:00 AM 02/28/2022   11:18 AM  Fall Risk  Falls in the past year? 0    0  Was there an injury with Fall?     0  Fall Risk Category Calculator     0  Fall Risk Category (Retired)     Low   (RETIRED) Patient Fall Risk Level Moderate fall risk  High fall risk  High fall risk  High fall risk    Patient at Risk for Falls Due to History of fall(s)      Fall risk Follow up Falls evaluation completed          Data saved with a previous flowsheet row definition   Functional Status Survey:    Vitals:   08/20/24 1604 09/02/24 1501  BP: (!) 143/65 (!) 118/52  Pulse: 92   Resp: 18   Temp: (!) 97 F (36.1 C)   SpO2: 98%   Weight: 128 lb 6.4 oz (58.2 kg)   Height: 5' 3 (1.6 m)    Body mass index is 22.75 kg/m. Physical Exam  Labs reviewed: Recent Labs    07/23/24 0622 07/24/24 0241 07/25/24 0630 08/08/24 0000  NA 135 138 138 137  K 4.5 4.0 3.8 3.6  CL 99 97* 98 98*  CO2 29 31 32 33*  GLUCOSE 161* 123* 104*  --   BUN 17 26* 22 24*  CREATININE 1.06* 1.22* 1.07* 1.1  CALCIUM  9.6 9.6 10.2 10.4   Recent Labs    07/21/24 0849 07/25/24 0630  AST 22 22  ALT 20 11  ALKPHOS 105 77  BILITOT 0.9 1.5*  PROT 7.2 5.8*  ALBUMIN   3.9 3.1*   Recent Labs    06/24/24 1011  07/21/24 0849 07/23/24 0622 07/23/24 0928 07/24/24 0241 07/25/24 0630  WBC 7.2 9.1   < > 16.4* 13.1* 10.6*  NEUTROABS 4,298 6.5  --   --   --   --   HGB 15.4 16.0*   < > 11.3* 9.8* 11.1*  HCT 47.7* 48.6*   < > 35.1* 29.7* 33.9*  MCV 97.7 95.9   < > 99.7 97.4 97.1  PLT 227 265   < > 191 175 221   < > = values in this interval not displayed.   Lab Results  Component Value Date   TSH 1.197 10/02/2020   Lab Results  Component Value Date   HGBA1C 5.9 (H) 10/27/2021   Lab Results  Component Value Date   CHOL 116 04/26/2013   HDL 45 04/26/2013   LDLCALC 60 04/26/2013   TRIG 56 04/26/2013   CHOLHDL 2.6 04/26/2013    Significant Diagnostic Results in last 30 days:  No results found.   Assessment/Plan 1. Acute pain of right knee (Primary)   2. Gastroesophageal reflux disease without esophagitis   3. Paroxysmal atrial fibrillation (HCC)   4. Hypothyroidism, unspecified type     Family/ staff Communication:   Labs/tests ordered:

## 2024-08-20 NOTE — Assessment & Plan Note (Signed)
 Furosemide  prn, Chlorthalidone  25mg  qd.

## 2024-09-02 NOTE — Assessment & Plan Note (Addendum)
 Sbp runs high at times, Dbp runs low in 50-60s, on Metoprolol , Diltiazem  for heart rate control, also takes diuretics.

## 2024-09-06 ENCOUNTER — Encounter: Payer: Self-pay | Admitting: Sports Medicine

## 2024-09-06 ENCOUNTER — Non-Acute Institutional Stay (SKILLED_NURSING_FACILITY): Payer: Self-pay | Admitting: Sports Medicine

## 2024-09-06 DIAGNOSIS — S7291XS Unspecified fracture of right femur, sequela: Secondary | ICD-10-CM | POA: Diagnosis not present

## 2024-09-06 DIAGNOSIS — M25561 Pain in right knee: Secondary | ICD-10-CM | POA: Diagnosis not present

## 2024-09-06 DIAGNOSIS — I10 Essential (primary) hypertension: Secondary | ICD-10-CM | POA: Diagnosis not present

## 2024-09-06 DIAGNOSIS — I480 Paroxysmal atrial fibrillation: Secondary | ICD-10-CM | POA: Diagnosis not present

## 2024-09-06 NOTE — Progress Notes (Unsigned)
 Provider:  Dr. Jackalyn Blazing Location:  [facility] Place of Service:   skilled care  PCP: [name], MD (Inactive) Patient Care Team: [name], MD (Inactive) as PCP - General (Family Medicine) Court Dorn PARAS, MD as PCP - Cardiology (Cardiology) Shari Sieving, MD as Consulting Physician (Orthopedic Surgery) Court Dorn PARAS, MD as Consulting Physician (Cardiology) Camillo Golas, MD as Consulting Physician (Ophthalmology)  Extended Emergency Contact Information Primary Emergency Contact: Kristalyn, [place], [place] [address] [place] Home Phone: [phone] Mobile Phone: [phone] Relation: Son Secondary Emergency Contact: [name] Address: [address]          [place], [place] [address] [place] Home Phone: [phone] Mobile Phone: [phone] Relation: Son  Goals of Care: Advanced Directive information    08/01/2024   11:39 AM  Advanced Directives  Does Patient Have a Medical Advance Directive? Yes  Type of Advance Directive Healthcare Power of Attorney  Does patient want to make changes to medical advance directive? No - Patient declined  Copy of Healthcare Power of Attorney in Chart? Yes - validated most recent copy scanned in chart (See row information)        History of Present Illness  89 yr old F with h/o OA, GERD, constipation, Afib, Hypothyroidism, Hypokalemia, Depression is evaluated for acute visit for Rt knee pain and swelling   Pt seen and examined in her room  She is sitting in recliner chair She is able to get up on her own, and use walker  C/o pain in her RT knee constantly for the past 3 days Pt denies fevers, chills, States it feels warm to touch  Denies cough, sob, abdominal pain, nausea, vomiting, dysuria, hematuria, bloody or dark stools.  Afib On eliquis  No signs of bleeding  HTN  On metoprolol  , cartia  XT  Past Medical History:  Diagnosis  Date   Anxiety attack    take RX prn (08/13/2013)   Arthritis    probably (08/13/2013)   Atrial fibrillation (HCC)    new onset/pt report 08/13/2013   Bladder wall thickening 07/01/2021   CT scan in Epic   Bronchitis 12/2011   Cholelithiasis 07/01/2021   CT in Epic.   Common bile duct dilation 07/01/2021   CT in Epic:  13 mm.   Complication of anesthesia    Compression fracture of first lumbar vertebra (HCC) 07/01/2021   CT in Epic.   Depression    Femur fracture (HCC)    left proximal femur non-union   GERD (gastroesophageal reflux disease)    Grieving 05/03/2023   H/O hiatal hernia    Hardware complicating wound infection 08/22/2016   History of blood transfusion    w/both knee OR's (08/13/2013)   Hyperlipidemia    Hyperlipidemia    Hypertension    Hypertensive urgency 10/22/2018   Hypothyroidism    takes synthroid    Insomnia    Intertrochanteric fracture of right hip (HCC) 07/30/2015   Neuromuscular disorder (HCC)    carpal tunnel in right hand   Periprosthetic fracture around internal prosthetic right hip joint (HCC), nonunion basicervical femoral neck with broken hardware and subtrochanteric fracture 03/16/2016   PONV (postoperative nausea and vomiting)    Prosthetic hip infection 08/22/2016   Prosthetic joint infection 05/02/2023   Proteus infection 08/22/2016   Serratia infection 11/14/2016   Shingles    Skin cancer of face 2000's   one on each side of  my face and one on my nose (08/13/2013)   Swelling of extremity    sees Dr. Court, cardiologist 1 x year [phone]   Tendon dysfunction 08/22/2016   Wears hearing aid    [facility]   Past Surgical History:  Procedure Laterality Date   2D ECHOCARDIOGRAM  12/07/2004   EF 48%, moderate pulmonary hypertension   BREAST BIOPSY Left ?1970's   CARDIOVASCULAR STRESS TEST  12/07/11   CARDIOVASCULAR STRESS TEST  12/07/2011   LEXISCAN , normal scan, no significant wall abnormalities noted   CARPAL TUNNEL RELEASE Right ~  2005   CATARACT EXTRACTION, BILATERAL Bilateral 2000   CYSTOSCOPY  11/02/2021   Procedure: CYSTOSCOPY;  Surgeon: Viktoria Comer SAUNDERS, MD;  Location: [facility];  Service: Gynecology;;   DILATION AND CURETTAGE OF UTERUS  1950's   DOPPLER ECHOCARDIOGRAPHY  12/07/11   EYE SURGERY     FEMUR IM NAIL Right 08/01/2015   Procedure: INTRAMEDULLARY (IM) NAIL FEMORAL;  Surgeon: Fonda Olmsted, MD;  Location: [facility] OR;  Service: Orthopedics;  Laterality: Right;   HARDWARE REMOVAL Right 03/16/2016   Procedure: HARDWARE REMOVAL;  Surgeon: Fonda Olmsted, MD;  Location[facility];  Service: Orthopedics;  Laterality: Right;   INTRAMEDULLARY (IM) NAIL INTERTROCHANTERIC Left 01/26/2017   Procedure: INTRAMEDULLARY (IM) NAIL INTERTROCHANTRIC;  Surgeon: Kay CHRISTELLA Cummins, MD;  Location: [facility] OR;  Service: Orthopedics;  Laterality: Left;   JOINT REPLACEMENT     bilateral knee replacement   ORIF FEMUR FRACTURE Right 07/22/2024   Procedure: OPEN REDUCTION INTERNAL FIXATION (ORIF) DISTAL FEMUR FRACTURE;  Surgeon: Kendal Franky SQUIBB, MD;  Location: [facility] OR;  Service: Orthopedics;  Laterality: Right;   ROBOTIC ASSISTED BILATERAL SALPINGO OOPHERECTOMY Bilateral 11/02/2021   Procedure: XI ROBOTIC ASSISTED BILATERAL SALPINGO OOPHORECTOMY;  Surgeon: Viktoria Comer SAUNDERS, MD;  Location: [facility];  Service: Gynecology;  Laterality: Bilateral;   ROBOTIC ASSISTED TOTAL HYSTERECTOMY N/A 11/02/2021   Procedure: XI ROBOTIC ASSISTED TOTAL HYSTERECTOMY WITH  MINI LAPAROTOMY;  Surgeon: Viktoria Comer SAUNDERS, MD;  Location: [facility];  Service: Gynecology;  Laterality: N/A;   SKIN CANCER EXCISION  2000's   off my nose (08/13/2013)   TOTAL HIP ARTHROPLASTY Right 03/16/2016   Procedure: TOTAL HIP ARTHROPLASTY;  Surgeon: Fonda Olmsted, MD;  Location: [facility];  Service: Orthopedics;  Laterality: Right;   TOTAL HIP ARTHROPLASTY Left 06/09/2017   Procedure: REMOVE LEFT HIP INTRAMEDULLARY NAIL AND CONVERT TO LEFT CEMENTED HEMI HIP ARTHROPLASTY;  Surgeon: Cummins Kay CHRISTELLA, MD;   Location: [facility];  Service: Orthopedics;  Laterality: Left;   TOTAL KNEE ARTHROPLASTY Right 2005   TOTAL KNEE ARTHROPLASTY  03/02/2012   Procedure: TOTAL KNEE ARTHROPLASTY;  Surgeon: LELON JONETTA Shari Mickey., MD;  Location: [facility] OR;  Service: Orthopedics;  Laterality: Left;    reports that she has never smoked. She has never used smokeless tobacco. She reports that she does not drink alcohol and does not use drugs. Social History   Socioeconomic History   Marital status: Widowed    Spouse name: Not on file   Number of children: Not on file   Years of education: Not on file   Highest education level: Not on file  Occupational History   Not on file  Tobacco Use   Smoking status: Never   Smokeless tobacco: Never  Vaping Use   Vaping status: Never Used  Substance and Sexual Activity   Alcohol use: No   Drug use: No   Sexual activity: Not Currently  Other Topics Concern   Not on file  Social History Narrative   Widowed. Lives at [facility]. Son died in the [place] War and she has not slept well since then.   Admitted to [facility] 01/30/17   Never smoked   Alcohol none   Full Code   Social Drivers of Health   Financial Resource Strain: Not on file  Food Insecurity: No Food Insecurity (07/21/2024)   Hunger Vital Sign    Worried About Running Out of Food in the Last Year: Never true    Ran Out of Food in the Last Year: Never true  Transportation Needs: No Transportation Needs (07/21/2024)   PRAPARE - Administrator, Civil Service (Medical): No    Lack of Transportation (Non-Medical): No  Physical Activity: Not on file  Stress: Not on file  Social Connections: Unknown (07/21/2024)   Social Connection and Isolation Panel    Frequency of Communication with Friends and Family: Three times a week    Frequency of Social Gatherings with Friends and Family: Three times a week    Attends Religious Services: 1 to 4 times per year    Active Member of Clubs or  Organizations: No    Attends Banker Meetings: 1 to 4 times per year    Marital Status: Not on file  Intimate Partner Violence: Not At Risk (07/21/2024)   Humiliation, Afraid, Rape, and Kick questionnaire    Fear of Current or Ex-Partner: No    Emotionally Abused: No    Physically Abused: No    Sexually Abused: No    Functional Status Survey:    Family History  Problem Relation Age of Onset   Arrhythmia Mother    Heart disease Mother    Stroke Father 25   Arrhythmia Sister    Heart disease Sister    Heart disease Sister    Stroke Sister    Diabetes Sister    Heart disease Brother    Heart disease Brother    Cancer Brother        Colon cancer   Arrhythmia Brother    Heart disease Brother    Heart disease Child 46   Heart disease Child 85   Breast cancer Neg Hx    Ovarian cancer Neg Hx    Endometrial cancer Neg Hx    Pancreatic cancer Neg Hx    Prostate cancer Neg Hx     Health Maintenance  Topic Date Due   FOOT EXAM  Never done   OPHTHALMOLOGY EXAM  Never done   DEXA SCAN  Never done   Pneumococcal Vaccine: 50+ Years (2 of 2 - PCV) 11/06/2012   COVID-19 Vaccine (3 - Mixed Product risk series) 02/10/2020   HEMOGLOBIN A1C  04/26/2022   DTaP/Tdap/Td (2 - Td or Tdap) 10/12/2022   Influenza Vaccine  07/12/2024   Zoster Vaccines- Shingrix  Completed   HPV VACCINES  Aged Out   Meningococcal B Vaccine  Aged Out    Allergies  Allergen Reactions   Levaquin [Levofloxacin] Other (See Comments)    tendinopathy   Morphine  And Codeine Other (See Comments)    Made pt wild   Morphine  Sulfate     Other reaction(s): made pt wild    Outpatient Encounter Medications as of 09/06/2024  Medication Sig   Apoaequorin (PREVAGEN EXTRA STRENGTH) 20 MG CAPS Take 1 tablet by mouth every evening.   chlorthalidone  (HYGROTON ) 25 MG tablet Take 25 mg by mouth daily.   Cholecalciferol  2000 units CAPS Take 2,000  Units by mouth every evening.   diltiazem  (CARDIZEM  CD) 180  MG 24 hr capsule Take 180 mg by mouth daily.   ELIQUIS  2.5 MG TABS tablet TAKE ONE TABLET BY MOUTH TWICE DAILY.   esomeprazole  (NEXIUM ) 20 MG capsule Take 20 mg by mouth as needed.   furosemide  (LASIX ) 20 MG tablet Take 20 mg by mouth as needed for edema.   lactose free nutrition (BOOST) LIQD Take 237 mLs by mouth daily.   levothyroxine  (SYNTHROID ) 112 MCG tablet Take 112 mcg by mouth daily before breakfast.    metoprolol  tartrate (LOPRESSOR ) 50 MG tablet Take 50 mg by mouth 2 (two) times daily.   Multiple Vitamins-Minerals (PRESERVISION AREDS 2+MULTI VIT PO) Take 1 capsule by mouth in the morning and at bedtime.   oxyCODONE  (OXY IR/ROXICODONE ) 5 MG immediate release tablet Take 0.5 tablets (2.5 mg total) by mouth every 6 (six) hours as needed for severe pain (pain score 7-10).   polyethylene glycol (MIRALAX  / GLYCOLAX ) 17 g packet Take 17 g by mouth daily as needed for mild constipation.   polyethylene glycol (MIRALAX  / GLYCOLAX ) 17 g packet Take 17 g by mouth daily. See as needed dosing also   potassium chloride  (KLOR-CON  M) 10 MEQ tablet Take 10 mEq by mouth as needed. And take 1 tablet by mouth when taking 1 of the 20 mg of Lasix .   senna (SENOKOT) 8.6 MG tablet Take 2 tablets by mouth at bedtime.   sulfamethoxazole -trimethoprim  (BACTRIM ) 400-80 MG tablet Take 1 tablet by mouth 2 (two) times daily.   temazepam  (RESTORIL ) 30 MG capsule Take 30 mg by mouth at bedtime as needed for sleep.   No facility-administered encounter medications on file as of 09/06/2024.    Review of Systems Negative unless indicated in HPI.  There were no vitals filed for this visit. There is no height or weight on file to calculate BMI. BP Readings from Last 3 Encounters:  09/02/24 (!) 118/52  08/13/24 132/65  08/01/24 125/70   Wt Readings from Last 3 Encounters:  08/20/24 128 lb 6.4 oz (58.2 kg)  08/13/24 128 lb (58.1 kg)  08/01/24 125 lb 14.4 oz (57.1 kg)   Physical Exam Constitutional:       Appearance: Normal appearance.  HENT:     Head: Normocephalic and atraumatic.  Cardiovascular:     Rate and Rhythm: Normal rate and regular rhythm.  Pulmonary:     Effort: Pulmonary effort is normal. No respiratory distress.     Breath sounds: Normal breath sounds. No wheezing.  Abdominal:     General: Bowel sounds are normal. There is no distension.     Tenderness: There is no abdominal tenderness. There is no guarding or rebound.     Comments:    Musculoskeletal:     Comments: Rt hip incision in the upper half  healed well, no redness  Lower half incision - slightly redness, closed , indurated skin    Rt knee - warmth +, swollen  Tender to touch   Lower extremity swelling noted.  Neurological:     Mental Status: She is alert. Mental status is at baseline.     Motor: No weakness.     Labs reviewed: Basic Metabolic Panel: Recent Labs    07/23/24 0622 07/24/24 0241 07/25/24 0630 08/08/24 0000  NA 135 138 138 137  K 4.5 4.0 3.8 3.6  CL 99 97* 98 98*  CO2 29 31 32 33*  GLUCOSE 161* 123* 104*  --   BUN  17 26* 22 24*  CREATININE 1.06* 1.22* 1.07* 1.1  CALCIUM  9.6 9.6 10.2 10.4   Liver Function Tests: Recent Labs    07/21/24 0849 07/25/24 0630  AST 22 22  ALT 20 11  ALKPHOS 105 77  BILITOT 0.9 1.5*  PROT 7.2 5.8*  ALBUMIN  3.9 3.1*   No results for input(s): LIPASE, AMYLASE in the last 8760 hours. No results for input(s): AMMONIA in the last 8760 hours. CBC: Recent Labs    06/24/24 1011 07/21/24 0849 07/23/24 0622 07/23/24 0928 07/24/24 0241 07/25/24 0630  WBC 7.2 9.1   < > 16.4* 13.1* 10.6*  NEUTROABS 4,298 6.5  --   --   --   --   HGB 15.4 16.0*   < > 11.3* 9.8* 11.1*  HCT 47.7* 48.6*   < > 35.1* 29.7* 33.9*  MCV 97.7 95.9   < > 99.7 97.4 97.1  PLT 227 265   < > 191 175 221   < > = values in this interval not displayed.   Cardiac Enzymes: No results for input(s): CKTOTAL, CKMB, CKMBINDEX, TROPONINI in the last 8760  hours. BNP: Invalid input(s): POCBNP Lab Results  Component Value Date   HGBA1C 5.9 (H) 10/27/2021   Lab Results  Component Value Date   TSH 1.197 10/02/2020   Lab Results  Component Value Date   VITAMINB12 512 08/03/2015   Lab Results  Component Value Date   FOLATE 20.5 08/03/2015   Lab Results  Component Value Date   IRON 16 (L) 08/03/2015   TIBC 238 (L) 08/03/2015   FERRITIN 151 08/03/2015    Imaging and Procedures obtained prior to SNF admission: DG FEMUR PORT, MIN 2 VIEWS RIGHT Result Date: 07/22/2024 CLINICAL DATA:  Femur fracture, postop. EXAM: RIGHT FEMUR PORTABLE 2 VIEW COMPARISON:  Preoperative imaging FINDINGS: Lateral plate and screw fixation of distal femoral fracture. Decreased fracture displacement from preoperative imaging. Previous hip in the arthroplasty. Recent postsurgical change includes air and edema in the soft tissues. IMPRESSION: ORIF of distal femoral fracture. Electronically Signed   By: Andrea Gasman M.D.   On: 07/22/2024 14:16   DG FEMUR, MIN 2 VIEWS RIGHT Result Date: 07/22/2024 CLINICAL DATA:  Elective surgery. EXAM: RIGHT FEMUR 2 VIEWS COMPARISON:  Preoperative imaging FINDINGS: Seven fluoroscopic spot views of the right femur submitted from the operating room. Plate and screw fixation of distal femur fracture. Previous knee and hip arthroplasty. Fluoroscopy time 42 seconds. Dose 1.91 mGy. IMPRESSION: Intraoperative fluoroscopy during distal femur fracture ORIF. Electronically Signed   By: Andrea Gasman M.D.   On: 07/22/2024 14:15   DG C-Arm 1-60 Min-No Report Result Date: 07/22/2024 Fluoroscopy was utilized by the requesting physician.  No radiographic interpretation.   EEG adult Result Date: 07/21/2024 Shelton Arlin KIDD, MD     07/21/2024  4:39 PM Patient Name: IREAN KENDRICKS MRN: [id number] Epilepsy Attending: Arlin KIDD Shelton Referring Physician/Provider: Georgina Basket, MD Date: 07/21/2024 Duration: 22.27 mins Patient history: 89yo F  with syncope. EEG to evaluate for seizure Level of alertness: Awake AEDs during EEG study: GBP Technical aspects: This EEG study was done with scalp electrodes positioned according to the 10-20 International system of electrode placement. Electrical activity was reviewed with band pass filter of 1-70Hz , sensitivity of 7 uV/mm, display speed of 1mm/sec with a 60Hz  notched filter applied as appropriate. EEG data were recorded continuously and digitally stored.  Video monitoring was available and reviewed as appropriate. Description: The posterior dominant rhythm consists of 8-9 Hz  activity of moderate voltage (25-35 uV) seen predominantly in posterior head regions, symmetric and reactive to eye opening and eye closing. Hyperventilation and photic stimulation were not performed.   IMPRESSION: This study is within normal limits. No seizures or epileptiform discharges were seen throughout the recording. A normal interictal EEG does not exclude the diagnosis of epilepsy. Arlin MALVA Krebs   DG Chest 1 View Result Date: 07/21/2024 EXAM: 1 VIEW XRAY OF THE CHEST 07/21/2024 09:34:00 AM COMPARISON: 07/25/2017 CLINICAL HISTORY: Fall. Fell off toilet and now has right upper leg pain. FINDINGS: LUNGS AND PLEURA: No focal pulmonary opacity. No pulmonary edema. No pleural effusion. No pneumothorax. HEART AND MEDIASTINUM: Moderate cardiomegaly. Atherosclerosis in the transverse aorta. BONES AND SOFT TISSUES: No acute osseous abnormality. Skin fold over the superolateral right hemithorax. IMPRESSION: 1. No acute process. 2. Moderate cardiomegaly and atherosclerosis in the transverse aorta. Electronically signed by: Rockey Kilts MD 07/21/2024 09:58 AM EDT RP Workstation: [id number]   DG Femur Min 2 Views Right Result Date: 07/21/2024 EXAM: 2 VIEW(S) XRAY OF THE RIGHT FEMUR 07/21/2024 09:34:00 AM COMPARISON: CT Head dated 01/25/2016. CLINICAL HISTORY: Fall. Fell off toilet and now has right upper leg pain. FINDINGS: BONES AND  JOINTS: Right hip hemiarthroplasty. Total knee arthroplasty. Osteopenia. Oblique fracture of the mid to distal femoral shaft with nearly 1 shaft width of medial displacement of distal fracture fragment. Extensive overlap and angulation on the lateral view, apex anterior. No intraarticular extension. SOFT TISSUES: Vascular calcifications. IMPRESSION: 1. Oblique fracture of the mid to distal femoral shaft with nearly 1 shaft width of medial displacement of the distal fracture fragment, extensive overlap, and apex anterior angulation on the lateral view. No intraarticular extension. 2. Right hip hemiarthroplasty and total knee arthroplasty. 3. Osteopenia. Electronically signed by: Rockey Kilts MD 07/21/2024 09:56 AM EDT RP Workstation: [id number]    Assessment and Plan Assessment & Plan  Rt hip fracture  Rt Knee pain  Warmth  swollen, tender to touch  Will get labs cbc, bmp, esr, crp  Will get x ray knee  Will get u/s Rt knee Will start tylenol  650 mg q6 prn  Ortho follow up   HTN At goal Denies dizziness Cont with the same  Afib  No signs of bleeding Cont with eliquis , cartia  xt, metoprolol 

## 2024-09-11 ENCOUNTER — Non-Acute Institutional Stay (SKILLED_NURSING_FACILITY): Payer: Self-pay | Admitting: Nurse Practitioner

## 2024-09-11 ENCOUNTER — Encounter: Payer: Self-pay | Admitting: Nurse Practitioner

## 2024-09-11 ENCOUNTER — Encounter: Payer: Self-pay | Admitting: Sports Medicine

## 2024-09-11 DIAGNOSIS — K219 Gastro-esophageal reflux disease without esophagitis: Secondary | ICD-10-CM | POA: Diagnosis not present

## 2024-09-11 DIAGNOSIS — M25561 Pain in right knee: Secondary | ICD-10-CM

## 2024-09-11 DIAGNOSIS — T8450XD Infection and inflammatory reaction due to unspecified internal joint prosthesis, subsequent encounter: Secondary | ICD-10-CM

## 2024-09-11 DIAGNOSIS — I480 Paroxysmal atrial fibrillation: Secondary | ICD-10-CM

## 2024-09-11 DIAGNOSIS — R600 Localized edema: Secondary | ICD-10-CM

## 2024-09-11 DIAGNOSIS — E039 Hypothyroidism, unspecified: Secondary | ICD-10-CM

## 2024-09-11 DIAGNOSIS — E871 Hypo-osmolality and hyponatremia: Secondary | ICD-10-CM

## 2024-09-11 DIAGNOSIS — G4700 Insomnia, unspecified: Secondary | ICD-10-CM

## 2024-09-11 DIAGNOSIS — G8929 Other chronic pain: Secondary | ICD-10-CM

## 2024-09-11 NOTE — Assessment & Plan Note (Signed)
 takes Levothyroxine , TSH 4.22 07/30/24

## 2024-09-11 NOTE — Assessment & Plan Note (Signed)
 SIADH, hx of fluid restriction, Na 137 09/06/24,  treated with Furosemide  in the past

## 2024-09-11 NOTE — Assessment & Plan Note (Signed)
 OA s/p R+L knee replacement, bil hip prosthesis             History of positive culture on her right total hip replacement, has been treated Bactrim  for years.

## 2024-09-11 NOTE — Progress Notes (Signed)
 Location:   SNF [facility] Room Number: 14 Place of Service:  SNF (31) Provider: Larwance Iridiana Fonner NP  [name], MD (Inactive)  Patient Care Team: [name], MD (Inactive) as PCP - General (Family Medicine) Court Dorn PARAS, MD as PCP - Cardiology (Cardiology) Shari Sieving, MD as Consulting Physician (Orthopedic Surgery) Court Dorn PARAS, MD as Consulting Physician (Cardiology) Camillo Golas, MD as Consulting Physician (Ophthalmology)  Extended Emergency Contact Information Primary Emergency Contact: Giuliana, [address] [place], [place] [address] [place] Home Phone: [phone] Mobile Phone: [phone] Relation: Son Secondary Emergency Contact: [name] Address: [address]          [place], [place] [address] [place] Home Phone: [phone] Mobile Phone: [phone] Relation: Son  Code Status:  DNR Goals of care: Advanced Directive information    09/06/2024   11:31 AM  Advanced Directives  Does Patient Have a Medical Advance Directive? Yes  Type of Advance Directive Healthcare Power of Attorney  Does patient want to make changes to medical advance directive? No - Patient declined     Chief Complaint  Patient presents with   Medical Management of Chronic Issues    HPI:  Pt is a 89 y.o. [place] seen today for medical management of chronic diseases.     The right knee pain with weightbearing worsened from yesterday, she attributed it to her over worked herself for her birthday party yesterday,  walked with therapy this morning, not new, underwent ortho evaluation in, improved swelling, mild warmth.  No pain in the right calf or with the right foot dorsiflexion.  Persistent right leg swelling from middle of the right thigh to the right foot. 09/09/24 X-ray and venous US  negative findings. CRP<3 09/06/24             Hospitalized 07/21/2024 to 07/25/2024 distal right femur fracture, status post ORIF 07/22/24,  WBAT, ambulating with walker, follow-up Dr. Kendal, prn Oxycodone , Tylenol , Gabapentin              OA s/p R+L knee replacement, bil hip prosthesis             History of positive culture on her right total hip replacement, has been treated Bactrim  for years.              Constipation, prn MiraLax , Senokot             GERD, taking Nexium , Hgb 13.3 09/06/24             Post op anemia, Hgb 13.3 09/06/24              Afib, takes Eliquis  [place] vas 2 score >3, takes Diltiazem , Metoprolol              Hypothyroidism, takes Levothyroxine , TSH 4.22 07/30/24             Hyponatremia, SIADH, hx of fluid restriction, Na 137 09/06/24,  treated with Furosemide  in the past             Hypokalemia, takes Kcl, K 3.7 09/06/24             Insomnia, takes Temazepam  prn             Edema BLE, Furosemide  prn, Chlorthalidone  25mg  qd.             Depression, off Duloxetine              OP, off Prolia ,  taking  Vit D, Vit D 70.72 07/23/24                  Past Medical History:  Diagnosis Date   Anxiety attack    take RX prn (08/13/2013)   Arthritis    probably (08/13/2013)   Atrial fibrillation (HCC)    new onset/pt report 08/13/2013   Bladder wall thickening 07/01/2021   CT scan in Epic   Bronchitis 12/2011   Cholelithiasis 07/01/2021   CT in Epic.   Common bile duct dilation 07/01/2021   CT in Epic:  13 mm.   Complication of anesthesia    Compression fracture of first lumbar vertebra (HCC) 07/01/2021   CT in Epic.   Depression    Femur fracture (HCC)    left proximal femur non-union   GERD (gastroesophageal reflux disease)    Grieving 05/03/2023   H/O hiatal hernia    Hardware complicating wound infection 08/22/2016   History of blood transfusion    w/both knee OR's (08/13/2013)   Hyperlipidemia    Hyperlipidemia    Hypertension    Hypertensive urgency 10/22/2018   Hypothyroidism    takes synthroid    Insomnia    Intertrochanteric fracture of right hip (HCC) 07/30/2015   Neuromuscular disorder (HCC)     carpal tunnel in right hand   Periprosthetic fracture around internal prosthetic right hip joint (HCC), nonunion basicervical femoral neck with broken hardware and subtrochanteric fracture 03/16/2016   PONV (postoperative nausea and vomiting)    Prosthetic hip infection 08/22/2016   Prosthetic joint infection 05/02/2023   Proteus infection 08/22/2016   Serratia infection 11/14/2016   Shingles    Skin cancer of face 2000's   one on each side of my face and one on my nose (08/13/2013)   Swelling of extremity    sees Dr. Court, cardiologist 1 x year [phone]   Tendon dysfunction 08/22/2016   Wears hearing aid    [facility]   Past Surgical History:  Procedure Laterality Date   2D ECHOCARDIOGRAM  12/07/2004   EF 48%, moderate pulmonary hypertension   BREAST BIOPSY Left ?1970's   CARDIOVASCULAR STRESS TEST  12/07/11   CARDIOVASCULAR STRESS TEST  12/07/2011   LEXISCAN , normal scan, no significant wall abnormalities noted   CARPAL TUNNEL RELEASE Right ~ 2005   CATARACT EXTRACTION, BILATERAL Bilateral 2000   CYSTOSCOPY  11/02/2021   Procedure: CYSTOSCOPY;  Surgeon: Viktoria Comer SAUNDERS, MD;  Location: [facility];  Service: Gynecology;;   DILATION AND CURETTAGE OF UTERUS  1950's   DOPPLER ECHOCARDIOGRAPHY  12/07/11   EYE SURGERY     FEMUR IM NAIL Right 08/01/2015   Procedure: INTRAMEDULLARY (IM) NAIL FEMORAL;  Surgeon: Fonda Olmsted, MD;  Location: [facility] OR;  Service: Orthopedics;  Laterality: Right;   HARDWARE REMOVAL Right 03/16/2016   Procedure: HARDWARE REMOVAL;  Surgeon: Fonda Olmsted, MD;  Location: [facility] OR;  Service: Orthopedics;  Laterality: Right;   INTRAMEDULLARY (IM) NAIL INTERTROCHANTERIC Left 01/26/2017   Procedure: INTRAMEDULLARY (IM) NAIL INTERTROCHANTRIC;  Surgeon: Kay CHRISTELLA Cummins, MD;  Location: [facility] OR;  Service: Orthopedics;  Laterality: Left;   JOINT REPLACEMENT     bilateral knee replacement   ORIF FEMUR FRACTURE Right 07/22/2024   Procedure: OPEN REDUCTION INTERNAL FIXATION (ORIF)  DISTAL FEMUR FRACTURE;  Surgeon: Kendal Franky SQUIBB, MD;  Location: [facility];  Service: Orthopedics;  Laterality: Right;   ROBOTIC ASSISTED BILATERAL SALPINGO OOPHERECTOMY Bilateral 11/02/2021   Procedure: XI ROBOTIC ASSISTED BILATERAL SALPINGO OOPHORECTOMY;  Surgeon: Viktoria Comer  R, MD;  Location: [facility];  Service: Gynecology;  Laterality: Bilateral;   ROBOTIC ASSISTED TOTAL HYSTERECTOMY N/A 11/02/2021   Procedure: XI ROBOTIC ASSISTED TOTAL HYSTERECTOMY WITH  MINI LAPAROTOMY;  Surgeon: Viktoria Comer SAUNDERS, MD;  Location: [facility];  Service: Gynecology;  Laterality: N/A;   SKIN CANCER EXCISION  2000's   off my nose (08/13/2013)   TOTAL HIP ARTHROPLASTY Right 03/16/2016   Procedure: TOTAL HIP ARTHROPLASTY;  Surgeon: Fonda Olmsted, MD;  Location: [facility];  Service: Orthopedics;  Laterality: Right;   TOTAL HIP ARTHROPLASTY Left 06/09/2017   Procedure: REMOVE LEFT HIP INTRAMEDULLARY NAIL AND CONVERT TO LEFT CEMENTED HEMI HIP ARTHROPLASTY;  Surgeon: Jerri Kay HERO, MD;  Location: [facility];  Service: Orthopedics;  Laterality: Left;   TOTAL KNEE ARTHROPLASTY Right 2005   TOTAL KNEE ARTHROPLASTY  03/02/2012   Procedure: TOTAL KNEE ARTHROPLASTY;  Surgeon: LELON JONETTA Shari Mickey., MD;  Location: [facility];  Service: Orthopedics;  Laterality: Left;    Allergies  Allergen Reactions   Levaquin [Levofloxacin] Other (See Comments)    tendinopathy   Morphine  And Codeine Other (See Comments)    Made pt wild   Morphine  Sulfate     Other reaction(s): made pt wild    Allergies as of 09/11/2024       Reactions   Levaquin [levofloxacin] Other (See Comments)   tendinopathy   Morphine  And Codeine Other (See Comments)   Made pt wild   Morphine  Sulfate    Other reaction(s): made pt wild        Medication List        Accurate as of September 11, 2024  2:26 PM. If you have any questions, ask your nurse or doctor.          chlorthalidone  25 MG tablet Commonly known as: HYGROTON  Take 25 mg by mouth daily.    Cholecalciferol  50 MCG (2000 UT) Caps Take 2,000 Units by mouth every evening.   diltiazem  180 MG 24 hr capsule Commonly known as: CARDIZEM  CD Take 180 mg by mouth daily.   Eliquis  2.5 MG Tabs tablet Generic drug: apixaban  TAKE ONE TABLET BY MOUTH TWICE DAILY.   esomeprazole  20 MG capsule Commonly known as: NEXIUM  Take 20 mg by mouth as needed.   furosemide  20 MG tablet Commonly known as: LASIX  Take 20 mg by mouth as needed for edema.   lactose free nutrition Liqd Take 237 mLs by mouth daily.   levothyroxine  112 MCG tablet Commonly known as: SYNTHROID  Take 112 mcg by mouth daily before breakfast.   metoprolol  tartrate 50 MG tablet Commonly known as: LOPRESSOR  Take 50 mg by mouth 2 (two) times daily.   oxyCODONE  5 MG immediate release tablet Commonly known as: Oxy IR/ROXICODONE  Take 0.5 tablets (2.5 mg total) by mouth every 6 (six) hours as needed for severe pain (pain score 7-10).   polyethylene glycol 17 g packet Commonly known as: MIRALAX  / GLYCOLAX  Take 17 g by mouth daily. See as needed dosing also   polyethylene glycol 17 g packet Commonly known as: MIRALAX  / GLYCOLAX  Take 17 g by mouth daily as needed for mild constipation.   potassium chloride  10 MEQ tablet Commonly known as: KLOR-CON  M Take 10 mEq by mouth as needed. And take 1 tablet by mouth when taking 1 of the 20 mg of Lasix .   potassium chloride  10 MEQ tablet Commonly known as: KLOR-CON  Take 10 mEq by mouth daily.   PRESERVISION AREDS 2+MULTI VIT PO Take 1 capsule by mouth in  the morning and at bedtime.   Prevagen Extra Strength 20 MG Caps Generic drug: Apoaequorin Take 1 tablet by mouth every evening.   senna 8.6 MG tablet Commonly known as: SENOKOT Take 2 tablets by mouth at bedtime.   sulfamethoxazole -trimethoprim  400-80 MG tablet Commonly known as: BACTRIM  Take 1 tablet by mouth 2 (two) times daily.   temazepam  30 MG capsule Commonly known as: RESTORIL  Take 30 mg by mouth at  bedtime as needed for sleep.        Review of Systems  Constitutional:  Negative for appetite change, fatigue and fever.  HENT:  Positive for hearing loss. Negative for congestion and trouble swallowing.   Eyes:        Wears glasses   Respiratory:  Negative for cough, chest tightness, shortness of breath and wheezing.   Cardiovascular:  Positive for leg swelling. Negative for chest pain and palpitations.  Gastrointestinal:  Negative for abdominal pain and constipation.       Has GERD  Genitourinary:  Negative for dysuria and frequency.  Musculoskeletal:  Positive for arthralgias and gait problem. Negative for back pain.       Has history of fracture and replacement of right hip and left hip. R knee pain  Skin:        Bruises right knee  Neurological:  Negative for syncope, light-headedness and headaches.  Hematological:  Does not bruise/bleed easily.  Psychiatric/Behavioral:  Positive for sleep disturbance. Negative for confusion. The patient is nervous/anxious.     Immunization History  Administered Date(s) Administered   Fluzone  Influenza virus vaccine,trivalent (IIV3), split virus 10/12/2012, 08/27/2013   Influenza Whole 03/05/2014   Influenza,inj,Quad PF,6+ Mos 10/12/2012, 08/22/2016, 10/16/2017   Influenza-Unspecified 08/27/2013, 10/01/2020   PPD Test 10/16/2012   Pneumococcal Polysaccharide-23 11/07/2011   Tdap 10/12/2012   Unspecified SARS-COV-2 Vaccination 12/16/2019, 01/13/2020   Zoster Recombinant(Shingrix) 11/28/2017, 02/26/2018   Pertinent  Health Maintenance Due  Topic Date Due   FOOT EXAM  Never done   DEXA SCAN  Never done   HEMOGLOBIN A1C  04/26/2022   OPHTHALMOLOGY EXAM  09/02/2023   Influenza Vaccine  07/12/2024      01/19/2021   10:20 AM 09/27/2021   11:21 AM 11/02/2021    2:52 PM 11/03/2021    2:00 AM 02/28/2022   11:18 AM  Fall Risk  Falls in the past year? 0    0  Was there an injury with Fall?     0  Fall Risk Category Calculator     0   Fall Risk Category (Retired)     Low   (RETIRED) Patient Fall Risk Level Moderate fall risk  High fall risk  High fall risk  High fall risk    Patient at Risk for Falls Due to History of fall(s)      Fall risk Follow up Falls evaluation completed          Data saved with a previous flowsheet row definition   Functional Status Survey:    Vitals:   09/11/24 1425  BP: 136/73  Pulse: (!) 58  Resp: 16  Temp: 98.1 F (36.7 C)  SpO2: Leah%  Weight: 129 lb 4.8 oz (58.7 kg)   Body mass index is 22.9 kg/m. Physical Exam Constitutional:      Appearance: Normal appearance. She is well-developed.  HENT:     Head: Normocephalic and atraumatic.     Nose: Nose normal.     Mouth/Throat:     Mouth: Mucous membranes are moist.  Eyes:     Conjunctiva/sclera: Conjunctivae normal.     Pupils: Pupils are equal, round, and reactive to light.  Neck:     Thyroid : No thyromegaly.  Cardiovascular:     Rate and Rhythm: Normal rate and regular rhythm.     Heart sounds: No murmur heard. Pulmonary:     Effort: Pulmonary effort is normal.     Breath sounds: Normal breath sounds.  Abdominal:     General: Bowel sounds are normal.     Palpations: Abdomen is soft.     Tenderness: There is no abdominal tenderness.  Musculoskeletal:        General: Tenderness present.     Cervical back: Normal range of motion and neck supple.     Right lower leg: Edema present.     Left lower leg: No edema.     Comments: Trace to 1+edema RLE, mild redness, warmth, and tenderness in the right knee, no pain in calves and with R+L foot dorsiflexion.  R distal femur s/p ORIF  Lymphadenopathy:     Cervical: No cervical adenopathy.  Skin:    General: Skin is warm and dry.     Comments: R distal femur s/p ORIF surgical scar  Neurological:     Mental Status: She is alert and oriented to person, place, and time. Mental status is at baseline.     Motor: No weakness.     Coordination: Coordination normal.     Gait: Gait  abnormal.  Psychiatric:        Mood and Affect: Mood normal.        Behavior: Behavior normal.     Labs reviewed: Recent Labs    07/23/24 0622 07/24/24 0241 07/25/24 0630 08/08/24 0000  NA 135 138 138 137  K 4.5 4.0 3.8 3.6  CL 99 97* 98 98*  CO2 29 31 32 33*  GLUCOSE 161* 123* 104*  --   BUN 17 26* 22 24*  CREATININE 1.06* 1.22* 1.07* 1.1  CALCIUM  9.6 9.6 10.2 10.4   Recent Labs    07/21/24 0849 07/25/24 0630  AST 22 22  ALT 20 11  ALKPHOS 105 77  BILITOT 0.9 1.5*  PROT 7.2 5.8*  ALBUMIN  3.9 3.1*   Recent Labs    06/24/24 1011 07/21/24 0849 07/23/24 0622 07/23/24 0928 07/24/24 0241 07/25/24 0630  WBC 7.2 9.1   < > 16.4* 13.1* 10.6*  NEUTROABS 4,298 6.5  --   --   --   --   HGB 15.4 16.0*   < > 11.3* 9.8* 11.1*  HCT 47.7* 48.6*   < > 35.1* 29.7* 33.9*  MCV 97.7 95.9   < > 99.7 97.4 97.1  PLT 227 265   < > 191 175 221   < > = values in this interval not displayed.   Lab Results  Component Value Date   TSH 1.197 10/02/2020   Lab Results  Component Value Date   HGBA1C 5.9 (H) 10/27/2021   Lab Results  Component Value Date   CHOL 116 04/26/2013   HDL 45 04/26/2013   LDLCALC 60 04/26/2013   TRIG 56 04/26/2013   CHOLHDL 2.6 04/26/2013    Significant Diagnostic Results in last 30 days:  No results found.  Assessment/Plan  Right knee pain The right knee pain with weightbearing worsened from yesterday, she attributed it to her over worked herself for her birthday party yesterday,  walked with therapy this morning, not new, underwent ortho evaluation in, improved swelling,  mild warmth.  No pain in the right calf or with the right foot dorsiflexion.  Persistent right leg swelling from middle of the right thigh to the right foot. 09/09/24 X-ray and venous US  negative findings.              Hospitalized 07/21/2024 to 07/25/2024 distal right femur fracture, status post ORIF 07/22/24, WBAT, ambulating with walker, follow-up Dr. Kendal, prn Oxycodone , Tylenol ,  Gabapentin   Observe, activity as tolerated.   Prosthetic joint infection OA s/p R+L knee replacement, bil hip prosthesis             History of positive culture on her right total hip replacement, has been treated Bactrim  for years.   GERD (gastroesophageal reflux disease) Stable,  taking Nexium , Hgb 13.3 09/06/24  Paroxysmal atrial fibrillation (HCC) takes Eliquis  [place] vas 2 score >3, takes Diltiazem , Metoprolol   Hypothyroidism takes Levothyroxine , TSH 4.22 07/30/24  Hyponatremia SIADH, hx of fluid restriction, Na 137 09/06/24,  treated with Furosemide  in the past  Insomnia  takes Temazepam  prn  Edema, peripheral Furosemide  prn, Chlorthalidone  25mg  qd.minimal swelling RLE   Family/ staff Communication: Plan of care reviewed with the patient and charge nurse  Labs/tests ordered: None

## 2024-09-11 NOTE — Assessment & Plan Note (Signed)
 The right knee pain with weightbearing worsened from yesterday, she attributed it to her over worked herself for her birthday party yesterday,  walked with therapy this morning, not new, underwent ortho evaluation in, improved swelling, mild warmth.  No pain in the right calf or with the right foot dorsiflexion.  Persistent right leg swelling from middle of the right thigh to the right foot. 09/09/24 X-ray and venous US  negative findings.              Hospitalized 07/21/2024 to 07/25/2024 distal right femur fracture, status post ORIF 07/22/24, WBAT, ambulating with walker, follow-up Dr. Kendal, prn Oxycodone , Tylenol , Gabapentin   Observe, activity as tolerated.

## 2024-09-11 NOTE — Assessment & Plan Note (Signed)
 Furosemide  prn, Chlorthalidone  25mg  qd.minimal swelling RLE

## 2024-09-11 NOTE — Assessment & Plan Note (Signed)
 takes Eliquis  [place] vas 2 score >3, takes Diltiazem , Metoprolol 

## 2024-09-11 NOTE — Assessment & Plan Note (Signed)
 takes Temazepam  prn

## 2024-09-11 NOTE — Assessment & Plan Note (Signed)
 Stable,  taking Nexium , Hgb 13.3 09/06/24

## 2024-09-13 ENCOUNTER — Encounter: Payer: Self-pay | Admitting: Nurse Practitioner

## 2024-09-13 ENCOUNTER — Non-Acute Institutional Stay (SKILLED_NURSING_FACILITY): Payer: Self-pay | Admitting: Nurse Practitioner

## 2024-09-13 DIAGNOSIS — G4700 Insomnia, unspecified: Secondary | ICD-10-CM

## 2024-09-13 DIAGNOSIS — K219 Gastro-esophageal reflux disease without esophagitis: Secondary | ICD-10-CM

## 2024-09-13 DIAGNOSIS — I480 Paroxysmal atrial fibrillation: Secondary | ICD-10-CM

## 2024-09-13 DIAGNOSIS — K5900 Constipation, unspecified: Secondary | ICD-10-CM

## 2024-09-13 DIAGNOSIS — E876 Hypokalemia: Secondary | ICD-10-CM

## 2024-09-13 DIAGNOSIS — D500 Iron deficiency anemia secondary to blood loss (chronic): Secondary | ICD-10-CM

## 2024-09-13 DIAGNOSIS — M810 Age-related osteoporosis without current pathological fracture: Secondary | ICD-10-CM

## 2024-09-13 DIAGNOSIS — T8450XD Infection and inflammatory reaction due to unspecified internal joint prosthesis, subsequent encounter: Secondary | ICD-10-CM

## 2024-09-13 DIAGNOSIS — M150 Primary generalized (osteo)arthritis: Secondary | ICD-10-CM | POA: Diagnosis not present

## 2024-09-13 DIAGNOSIS — M159 Polyosteoarthritis, unspecified: Secondary | ICD-10-CM | POA: Insufficient documentation

## 2024-09-13 DIAGNOSIS — E039 Hypothyroidism, unspecified: Secondary | ICD-10-CM

## 2024-09-13 DIAGNOSIS — R600 Localized edema: Secondary | ICD-10-CM

## 2024-09-13 DIAGNOSIS — E871 Hypo-osmolality and hyponatremia: Secondary | ICD-10-CM

## 2024-09-13 DIAGNOSIS — F339 Major depressive disorder, recurrent, unspecified: Secondary | ICD-10-CM

## 2024-09-13 NOTE — Assessment & Plan Note (Signed)
 Stable,  off Duloxetine 

## 2024-09-13 NOTE — Assessment & Plan Note (Signed)
 History of positive culture on her right total hip replacement, has been treated Bactrim  for years.

## 2024-09-13 NOTE — Assessment & Plan Note (Signed)
 Stable,  taking Nexium , Hgb 13.3 09/06/24

## 2024-09-13 NOTE — Assessment & Plan Note (Signed)
 Post op anemia, Hgb 13.3 09/06/24

## 2024-09-13 NOTE — Assessment & Plan Note (Signed)
 The right knee pain with weightbearing , not new, underwent ortho evaluation in, improved swelling, mild warmth.  No pain in the right calf or with the right foot dorsiflexion.  Persistent right leg swelling from middle of the right thigh to the right foot. 09/09/24 X-ray and venous US  negative findings. CRP<3 09/06/24             Hospitalized 07/21/2024 to 07/25/2024 distal right femur fracture, status post ORIF 07/22/24, WBAT, ambulating with walker, follow-up Dr. Kendal, prn Oxycodone , Tylenol , Gabapentin   s/p R+L knee replacement, bil hip prosthesis

## 2024-09-13 NOTE — Assessment & Plan Note (Signed)
 takes Levothyroxine , TSH 4.22 07/30/24

## 2024-09-13 NOTE — Assessment & Plan Note (Signed)
 off Prolia , taking  Vit D, Vit D 70.72 07/23/24, follow-up with her PCP

## 2024-09-13 NOTE — Assessment & Plan Note (Signed)
 Heart rate is controlled,  takes Eliquis  [place] vas 2 score >3, takes Diltiazem , Metoprolol 

## 2024-09-13 NOTE — Assessment & Plan Note (Signed)
 Mild swelling right lower leg,  Furosemide  prn, Chlorthalidone  25mg  qd.

## 2024-09-13 NOTE — Assessment & Plan Note (Signed)
 SIADH, hx of fluid restriction, Na 137 09/06/24,  treated with Furosemide  in the past

## 2024-09-13 NOTE — Assessment & Plan Note (Signed)
 takes Temazepam  prn

## 2024-09-13 NOTE — Progress Notes (Signed)
 Location:   [facility] Room Number: 37 Place of Service:  SNF (31)  Provider: Larwance Hark NP  PCP: [name], MD (Inactive) Patient Care Team: [name], MD (Inactive) as PCP - General (Family Medicine) Court Dorn PARAS, MD as PCP - Cardiology (Cardiology) Shari Sieving, MD as Consulting Physician (Orthopedic Surgery) Court Dorn PARAS, MD as Consulting Physician (Cardiology) Camillo Golas, MD as Consulting Physician (Ophthalmology)  Extended Emergency Contact Information Primary Emergency Contact: Iyanna, Drummer [place], [place] [address] [place] Home Phone: [phone] Mobile Phone: [phone] Relation: Son Secondary Emergency Contact: [name] Address: [address]          [place], [place] [address] [place] Home Phone: [phone] Mobile Phone: [phone] Relation: Son  Code Status: DNR Goals of care:  Advanced Directive information    09/06/2024   11:31 AM  Advanced Directives  Does Patient Have a Medical Advance Directive? Yes  Type of Advance Directive Healthcare Power of Attorney  Does patient want to make changes to medical advance directive? No - Patient declined     Allergies  Allergen Reactions   Levaquin [Levofloxacin] Other (See Comments)    tendinopathy   Morphine  And Codeine Other (See Comments)    Made pt wild   Morphine  Sulfate     Other reaction(s): made pt wild    No chief complaint on file.   HPI:  89 y.o. female with medical history significant for OA, chronic right hip prosthetic infection-on Bactrim  for years, constipation, GERD, A-fib, hypothyroidism, hyponatremia, hypokalemia, insomnia, edema BLE, depression, and osteoporosis was admitted to [facility] for therapy following the right knee surgery.   The right knee pain with weightbearing , not new, underwent ortho evaluation in, improved swelling, mild warmth.  No pain in the right calf or with the right  foot dorsiflexion.  Persistent right leg swelling from middle of the right thigh to the right foot. 09/09/24 X-ray and venous US  negative findings. CRP<3 09/06/24             Hospitalized 07/21/2024 to 07/25/2024 distal right femur fracture, status post ORIF 07/22/24, WBAT, ambulating with walker, follow-up Dr. Kendal, prn Oxycodone , Tylenol , Gabapentin   The patient has recovered from right knee surgery, regained physical strength, mobility, and ADL function she is a stable to return to her independent living at [facility] for continuation of therapy.             OA s/p R+L knee replacement, bil hip prosthesis             History of positive culture on her right total hip replacement, has been treated Bactrim  for years.              Constipation, prn MiraLax , Senokot             GERD, taking Nexium , Hgb 13.3 09/06/24             Post op anemia, Hgb 13.3 09/06/24              Afib, takes Eliquis  [place] vas 2 score >3, takes Diltiazem , Metoprolol              Hypothyroidism, takes Levothyroxine , TSH 4.22 07/30/24             Hyponatremia, SIADH, hx of fluid restriction, Na 137 09/06/24,  treated with Furosemide  in the past  Hypokalemia, takes Kcl, K 3.7 09/06/24             Insomnia, takes Temazepam  prn             Edema BLE, Furosemide  prn, Chlorthalidone  25mg  qd.             Depression, off Duloxetine              OP, off Prolia , taking  Vit D, Vit D 70.72 07/23/24                 Past Medical History:  Diagnosis Date   Anxiety attack    take RX prn (08/13/2013)   Arthritis    probably (08/13/2013)   Atrial fibrillation (HCC)    new onset/pt report 08/13/2013   Bladder wall thickening 07/01/2021   CT scan in Epic   Bronchitis 12/2011   Cholelithiasis 07/01/2021   CT in Epic.   Common bile duct dilation 07/01/2021   CT in Epic:  13 mm.   Complication of anesthesia    Compression fracture of first lumbar vertebra (HCC) 07/01/2021   CT in Epic.   Depression    Femur fracture (HCC)    left  proximal femur non-union   GERD (gastroesophageal reflux disease)    Grieving 05/03/2023   H/O hiatal hernia    Hardware complicating wound infection 08/22/2016   History of blood transfusion    w/both knee OR's (08/13/2013)   Hyperlipidemia    Hyperlipidemia    Hypertension    Hypertensive urgency 10/22/2018   Hypothyroidism    takes synthroid    Insomnia    Intertrochanteric fracture of right hip (HCC) 07/30/2015   Neuromuscular disorder (HCC)    carpal tunnel in right hand   Periprosthetic fracture around internal prosthetic right hip joint (HCC), nonunion basicervical femoral neck with broken hardware and subtrochanteric fracture 03/16/2016   PONV (postoperative nausea and vomiting)    Prosthetic hip infection 08/22/2016   Prosthetic joint infection 05/02/2023   Proteus infection 08/22/2016   Serratia infection 11/14/2016   Shingles    Skin cancer of face 2000's   one on each side of my face and one on my nose (08/13/2013)   Swelling of extremity    sees Dr. Court, cardiologist 1 x year [phone]   Tendon dysfunction 08/22/2016   Wears hearing aid    [facility]    Past Surgical History:  Procedure Laterality Date   2D ECHOCARDIOGRAM  12/07/2004   EF 48%, moderate pulmonary hypertension   BREAST BIOPSY Left ?1970's   CARDIOVASCULAR STRESS TEST  12/07/2011   CARDIOVASCULAR STRESS TEST  12/07/2011   LEXISCAN , normal scan, no significant wall abnormalities noted   CARPAL TUNNEL RELEASE Right ~ 2005   CATARACT EXTRACTION, BILATERAL Bilateral 12/12/1998   CYSTOSCOPY  11/02/2021   Procedure: CYSTOSCOPY;  Surgeon: Viktoria Comer SAUNDERS, MD;  Location: [facility];  Service: Gynecology;;   DILATION AND CURETTAGE OF UTERUS  08/12/1949   DOPPLER ECHOCARDIOGRAPHY  12/07/2011   EYE SURGERY     FEMUR IM NAIL Right 08/01/2015   Procedure: INTRAMEDULLARY (IM) NAIL FEMORAL;  Surgeon: Fonda Olmsted, MD;  Location: [facility] OR;  Service: Orthopedics;  Laterality: Right;   HARDWARE REMOVAL Right  03/16/2016   Procedure: HARDWARE REMOVAL;  Surgeon: Fonda Olmsted, MD;  Location: [facility] OR;  Service: Orthopedics;  Laterality: Right;   INTRAMEDULLARY (IM) NAIL INTERTROCHANTERIC Left 01/26/2017   Procedure: INTRAMEDULLARY (IM) NAIL INTERTROCHANTRIC;  Surgeon: Kay CHRISTELLA Cummins, MD;  Location: [facility] OR;  Service:  Orthopedics;  Laterality: Left;   JOINT REPLACEMENT     bilateral knee replacement   ORIF FEMUR FRACTURE Right 07/22/2024   Procedure: OPEN REDUCTION INTERNAL FIXATION (ORIF) DISTAL FEMUR FRACTURE;  Surgeon: Kendal Franky SQUIBB, MD;  Location: [facility];  Service: Orthopedics;  Laterality: Right;   ROBOTIC ASSISTED BILATERAL SALPINGO OOPHERECTOMY Bilateral 11/02/2021   Procedure: XI ROBOTIC ASSISTED BILATERAL SALPINGO OOPHORECTOMY;  Surgeon: Viktoria Comer SAUNDERS, MD;  Location: [facility];  Service: Gynecology;  Laterality: Bilateral;   ROBOTIC ASSISTED TOTAL HYSTERECTOMY N/A 11/02/2021   Procedure: XI ROBOTIC ASSISTED TOTAL HYSTERECTOMY WITH  MINI LAPAROTOMY;  Surgeon: Viktoria Comer SAUNDERS, MD;  Location: [facility];  Service: Gynecology;  Laterality: N/A;   SKIN CANCER EXCISION  08/13/1999   off my nose (08/13/2013)   TOTAL HIP ARTHROPLASTY Right 03/16/2016   Procedure: TOTAL HIP ARTHROPLASTY;  Surgeon: Fonda Olmsted, MD;  Location: [facility];  Service: Orthopedics;  Laterality: Right;   TOTAL HIP ARTHROPLASTY Left 06/09/2017   Procedure: REMOVE LEFT HIP INTRAMEDULLARY NAIL AND CONVERT TO LEFT CEMENTED HEMI HIP ARTHROPLASTY;  Surgeon: Jerri Kay HERO, MD;  Location: [facility];  Service: Orthopedics;  Laterality: Left;   TOTAL KNEE ARTHROPLASTY Right 12/13/2003   TOTAL KNEE ARTHROPLASTY  03/02/2012   Procedure: TOTAL KNEE ARTHROPLASTY;  Surgeon: LELON JONETTA Shari Mickey., MD;  Location: [facility];  Service: Orthopedics;  Laterality: Left;      reports that she has never smoked. She has never used smokeless tobacco. She reports that she does not drink alcohol and does not use drugs. Social History   Socioeconomic History   Marital  status: Widowed    Spouse name: Not on file   Number of children: Not on file   Years of education: Not on file   Highest education level: Not on file  Occupational History   Not on file  Tobacco Use   Smoking status: Never   Smokeless tobacco: Never  Vaping Use   Vaping status: Never Used  Substance and Sexual Activity   Alcohol use: No   Drug use: No   Sexual activity: Not Currently  Other Topics Concern   Not on file  Social History Narrative   Widowed. Lives at [facility]. Son died in the [place] War and she has not slept well since then.   Admitted to [facility] 01/30/17   Never smoked   Alcohol none   Full Code   Social Drivers of Health   Financial Resource Strain: Not on file  Food Insecurity: No Food Insecurity (07/21/2024)   Hunger Vital Sign    Worried About Running Out of Food in the Last Year: Never true    Ran Out of Food in the Last Year: Never true  Transportation Needs: No Transportation Needs (07/21/2024)   PRAPARE - Administrator, Civil Service (Medical): No    Lack of Transportation (Non-Medical): No  Physical Activity: Not on file  Stress: Not on file  Social Connections: Unknown (07/21/2024)   Social Connection and Isolation Panel    Frequency of Communication with Friends and Family: Three times a week    Frequency of Social Gatherings with Friends and Family: Three times a week    Attends Religious Services: 1 to 4 times per year    Active Member of Clubs or Organizations: No    Attends Banker Meetings: 1 to 4 times per year    Marital Status: Not on file  Intimate Partner Violence: Not At  Risk (07/21/2024)   Humiliation, Afraid, Rape, and Kick questionnaire    Fear of Current or Ex-Partner: No    Emotionally Abused: No    Physically Abused: No    Sexually Abused: No   Functional Status Survey:    Allergies  Allergen Reactions   Levaquin [Levofloxacin] Other (See Comments)    tendinopathy    Morphine  And Codeine Other (See Comments)    Made pt wild   Morphine  Sulfate     Other reaction(s): made pt wild    Pertinent  Health Maintenance Due  Topic Date Due   FOOT EXAM  Never done   Mammogram  Never done   DEXA SCAN  Never done   HEMOGLOBIN A1C  04/26/2022   OPHTHALMOLOGY EXAM  09/02/2023   Influenza Vaccine  07/12/2024    Medications: Allergies as of 09/13/2024       Reactions   Levaquin [levofloxacin] Other (See Comments)   tendinopathy   Morphine  And Codeine Other (See Comments)   Made pt wild   Morphine  Sulfate    Other reaction(s): made pt wild        Medication List        Accurate as of September 13, 2024 11:59 PM. If you have any questions, ask your nurse or doctor.          chlorthalidone  25 MG tablet Commonly known as: HYGROTON  Take 25 mg by mouth daily.   Cholecalciferol  50 MCG (2000 UT) Caps Take 2,000 Units by mouth every evening.   diltiazem  180 MG 24 hr capsule Commonly known as: CARDIZEM  CD Take 180 mg by mouth daily.   Eliquis  2.5 MG Tabs tablet Generic drug: apixaban  TAKE ONE TABLET BY MOUTH TWICE DAILY.   esomeprazole  20 MG capsule Commonly known as: NEXIUM  Take 20 mg by mouth as needed.   furosemide  20 MG tablet Commonly known as: LASIX  Take 20 mg by mouth as needed for edema.   lactose free nutrition Liqd Take 237 mLs by mouth daily.   levothyroxine  112 MCG tablet Commonly known as: SYNTHROID  Take 112 mcg by mouth daily before breakfast.   metoprolol  tartrate 50 MG tablet Commonly known as: LOPRESSOR  Take 50 mg by mouth 2 (two) times daily.   oxyCODONE  5 MG immediate release tablet Commonly known as: Oxy IR/ROXICODONE  Take 0.5 tablets (2.5 mg total) by mouth every 6 (six) hours as needed for severe pain (pain score 7-10).   polyethylene glycol 17 g packet Commonly known as: MIRALAX  / GLYCOLAX  Take 17 g by mouth daily. See as needed dosing also   polyethylene glycol 17 g packet Commonly known as: MIRALAX   / GLYCOLAX  Take 17 g by mouth daily as needed for mild constipation.   potassium chloride  10 MEQ tablet Commonly known as: KLOR-CON  M Take 10 mEq by mouth as needed. And take 1 tablet by mouth when taking 1 of the 20 mg of Lasix .   potassium chloride  10 MEQ tablet Commonly known as: KLOR-CON  Take 10 mEq by mouth daily.   PRESERVISION AREDS 2+MULTI VIT PO Take 1 capsule by mouth in the morning and at bedtime.   Prevagen Extra Strength 20 MG Caps Generic drug: Apoaequorin Take 1 tablet by mouth every evening.   senna 8.6 MG tablet Commonly known as: SENOKOT Take 2 tablets by mouth at bedtime.   sulfamethoxazole -trimethoprim  400-80 MG tablet Commonly known as: BACTRIM  Take 1 tablet by mouth 2 (two) times daily.   temazepam  30 MG capsule Commonly known as: RESTORIL  Take 30 mg  by mouth at bedtime as needed for sleep.        Review of Systems  Constitutional:  Negative for appetite change, fatigue and fever.  HENT:  Positive for hearing loss. Negative for congestion and trouble swallowing.   Eyes:        Wears glasses   Respiratory:  Negative for cough and shortness of breath.   Cardiovascular:  Positive for leg swelling. Negative for chest pain and palpitations.  Gastrointestinal:  Negative for abdominal pain and constipation.       Has GERD  Genitourinary:  Negative for dysuria and frequency.  Musculoskeletal:  Positive for arthralgias and gait problem. Negative for back pain.       R knee pain  Skin:        Bruises right knee  Neurological:  Negative for syncope, light-headedness and headaches.  Hematological:  Does not bruise/bleed easily.  Psychiatric/Behavioral:  Positive for sleep disturbance. Negative for confusion. The patient is nervous/anxious.     Vitals:   09/13/24 1327  BP: 136/73  Pulse: (!) 58  Resp: 16  Temp: 98.1 F (36.7 C)  SpO2: 96%  Weight: 129 lb 3.2 oz (58.6 kg)   Body mass index is 22.89 kg/m. Physical Exam Constitutional:       Appearance: Normal appearance. She is well-developed.  HENT:     Head: Normocephalic and atraumatic.     Nose: Nose normal.     Mouth/Throat:     Mouth: Mucous membranes are moist.  Eyes:     Conjunctiva/sclera: Conjunctivae normal.     Pupils: Pupils are equal, round, and reactive to light.  Neck:     Thyroid : No thyromegaly.  Cardiovascular:     Rate and Rhythm: Normal rate and regular rhythm.     Heart sounds: No murmur heard. Pulmonary:     Effort: Pulmonary effort is normal.     Breath sounds: Normal breath sounds.  Abdominal:     General: Bowel sounds are normal.     Palpations: Abdomen is soft.     Tenderness: There is no abdominal tenderness.  Musculoskeletal:        General: Tenderness present.     Cervical back: Normal range of motion and neck supple.     Right lower leg: Edema present.     Left lower leg: No edema.     Comments: Trace edema RLE, mild redness, warmth, and tenderness in the right knee, no pain in calves and with R+L foot dorsiflexion.  R distal femur s/p ORIF  Lymphadenopathy:     Cervical: No cervical adenopathy.  Skin:    General: Skin is warm and dry.     Comments: R distal femur s/p ORIF surgical scar  Neurological:     Mental Status: She is alert and oriented to person, place, and time. Mental status is at baseline.     Motor: No weakness.     Coordination: Coordination normal.     Gait: Gait abnormal.  Psychiatric:        Mood and Affect: Mood normal.        Behavior: Behavior normal.     Labs reviewed: Basic Metabolic Panel: Recent Labs    07/23/24 0622 07/24/24 0241 07/25/24 0630 08/08/24 0000  NA 135 138 138 137  K 4.5 4.0 3.8 3.6  CL 99 97* 98 98*  CO2 29 31 32 33*  GLUCOSE 161* 123* 104*  --   BUN 17 26* 22 24*  CREATININE 1.06* 1.22* 1.07*  1.1  CALCIUM  9.6 9.6 10.2 10.4   Liver Function Tests: Recent Labs    07/21/24 0849 07/25/24 0630  AST 22 22  ALT 20 11  ALKPHOS 105 77  BILITOT 0.9 1.5*  PROT 7.2 5.8*   ALBUMIN  3.9 3.1*   No results for input(s): LIPASE, AMYLASE in the last 8760 hours. No results for input(s): AMMONIA in the last 8760 hours. CBC: Recent Labs    06/24/24 1011 07/21/24 0849 07/23/24 0622 07/23/24 0928 07/24/24 0241 07/25/24 0630  WBC 7.2 9.1   < > 16.4* 13.1* 10.6*  NEUTROABS 4,298 6.5  --   --   --   --   HGB 15.4 16.0*   < > 11.3* 9.8* 11.1*  HCT 47.7* 48.6*   < > 35.1* 29.7* 33.9*  MCV 97.7 95.9   < > 99.7 97.4 97.1  PLT 227 265   < > 191 175 221   < > = values in this interval not displayed.   Cardiac Enzymes: No results for input(s): CKTOTAL, CKMB, CKMBINDEX, TROPONINI in the last 8760 hours. BNP: Invalid input(s): POCBNP CBG: No results for input(s): GLUCAP in the last 8760 hours.  Procedures and Imaging Studies During Stay: No results found.  Assessment/Plan:   Osteoarthritis, multiple sites The right knee pain with weightbearing , not new, underwent ortho evaluation in, improved swelling, mild warmth.  No pain in the right calf or with the right foot dorsiflexion.  Persistent right leg swelling from middle of the right thigh to the right foot. 09/09/24 X-ray and venous US  negative findings. CRP<3 09/06/24             Hospitalized 07/21/2024 to 07/25/2024 distal right femur fracture, status post ORIF 07/22/24, WBAT, ambulating with walker, follow-up Dr. Kendal, prn Oxycodone , Tylenol , Gabapentin   s/p R+L knee replacement, bil hip prosthesis  Prosthetic joint infection History of positive culture on her right total hip replacement, has been treated Bactrim  for years.   Constipation Stable, prn MiraLax , Senokot  GERD (gastroesophageal reflux disease) Stable,  taking Nexium , Hgb 13.3 09/06/24  Blood loss anemia   Post op anemia, Hgb 13.3 09/06/24  Paroxysmal atrial fibrillation (HCC) Heart rate is controlled, takes Eliquis  [place] vas 2 score >3, takes Diltiazem , Metoprolol   Hypothyroidism takes Levothyroxine , TSH 4.22  07/30/24  Hyponatremia  SIADH, hx of fluid restriction, Na 137 09/06/24,  treated with Furosemide  in the past  Hypokalemia  takes Kcl, K 3.7 09/06/24  Insomnia  takes Temazepam  prn  Edema, peripheral Mild swelling right lower leg,  Furosemide  prn, Chlorthalidone  25mg  qd.  Depression, recurrent Stable,  off Duloxetine   Osteoporosis  off Prolia , taking  Vit D, Vit D 70.72 07/23/24, follow-up with her PCP   Patient is being discharged with the following home health services:    Patient is being discharged with the following durable medical equipment:    Patient has been advised to f/u with their PCP in 1-2 weeks to bring them up to date on their rehab stay.  Social services at facility was responsible for arranging this appointment.  Pt was provided with a 30 day supply of prescriptions for medications and refills must be obtained from their PCP.  For controlled substances, a more limited supply may be provided adequate until PCP appointment only.  Future labs/tests needed:  prn

## 2024-09-13 NOTE — Assessment & Plan Note (Signed)
 takes Kcl, K 3.7 09/06/24

## 2024-09-13 NOTE — Assessment & Plan Note (Signed)
 Stable, prn MiraLax , Senokot

## 2024-10-07 ENCOUNTER — Other Ambulatory Visit: Payer: Self-pay | Admitting: Cardiovascular Disease

## 2024-10-08 ENCOUNTER — Telehealth: Payer: Self-pay | Admitting: Cardiovascular Disease

## 2024-10-08 MED ORDER — DILTIAZEM HCL ER COATED BEADS 180 MG PO CP24: 180.0000 mg | ORAL_CAPSULE | Freq: Every day | ORAL | 0 refills | Status: DC

## 2024-10-08 NOTE — Telephone Encounter (Signed)
*  STAT* If patient is at the pharmacy, call can be transferred to refill team.   1. Which medications need to be refilled? (please list name of each medication and dose if known) diltiazem  (CARDIZEM  CD) 180 MG 24 hr capsule   2. Which pharmacy/location (including street and city if local pharmacy) is medication to be sent to? [facility] [place], [place] - [address] Phone: [phone]  Fax: [phone]      3. Do they need a 30 day or 90 day supply? 90 Pt is out of medication

## 2024-10-08 NOTE — Telephone Encounter (Signed)
 Pt's medication was sent to pt's pharmacy as requested. Confirmation received.

## 2024-10-09 ENCOUNTER — Other Ambulatory Visit: Payer: Self-pay | Admitting: Cardiovascular Disease

## 2024-10-09 DIAGNOSIS — I4891 Unspecified atrial fibrillation: Secondary | ICD-10-CM

## 2024-10-09 NOTE — Telephone Encounter (Signed)
 Prescription refill request for Eliquis  received. Indication:afib Last office visit:12/24 Scr:1.07  8/25 Age: 89 Weight:58.6  kg  Prescription refilled

## 2024-10-14 ENCOUNTER — Encounter: Payer: Self-pay | Admitting: Radiology

## 2024-11-11 ENCOUNTER — Telehealth: Payer: Self-pay | Admitting: *Deleted

## 2024-11-11 NOTE — Telephone Encounter (Signed)
 Per provider moved appt from 12/4 to 12/3. Patient's son's aware

## 2024-11-13 ENCOUNTER — Encounter: Payer: Self-pay | Admitting: Gynecologic Oncology

## 2024-11-13 ENCOUNTER — Inpatient Hospital Stay: Attending: Gynecologic Oncology | Admitting: Gynecologic Oncology

## 2024-11-13 DIAGNOSIS — C541 Malignant neoplasm of endometrium: Secondary | ICD-10-CM

## 2024-11-13 NOTE — Progress Notes (Signed)
 Gynecologic Oncology Telehealth Note: Gyn-Onc  I connected with Leah Holland on 11/13/24 at  5:55 PM EST by telephone and verified that I am speaking with the correct person using two identifiers.  I discussed the limitations, risks, security and privacy concerns of performing an evaluation and management service by telemedicine and the availability of in-person appointments. I also discussed with the patient that there may be a patient responsible charge related to this service. The patient expressed understanding and agreed to proceed.  Other persons participating in the visit and their role in the encounter: none.  Patient's location: home Provider's location: [facility], [place]  Reason for Visit: follow-up  Treatment History: 06/2021: Patient seen initially for urinary incontinence. 07/01/2021: CT scan. 09/09/2021: Pelvic ultrasound shows enlarged uterus with internal mass and cystic/solid components.  Bilateral ovaries not identified, no adnexal masses appreciated. 09/24/2021: MRI A/P shows thick-walled cystic lesion in the midline pelvis, which appears to arise from the right ovary, measures 8.5 x 7.5 x 8.1 cm.  Findings highly concerning for cystic ovarian neoplasm.  No adenopathy or metastatic disease definitively noted. 11/02/2021: Robotic total hysterectomy with BSO, mini lap for specimen removal, cystoscopy.  Findings at surgery included enlarged uterus with pyometra, atrophic adnexa, retroperitoneal fibrosis and edema, significant diverticular disease, powder burn lesions on the uterus and pelvic peritoneum consistent with possible endometriosis. 11/02/2021: Final pathology reveals poorly differentiated carcinoma with clear cell features of the uterus, extends to subserosal myometrium.  Depth of invasion 95%, LVI not identified.  Cervix negative for carcinoma.  Adnexa negative for carcinoma.  Interval History: Haven't been feeling as well as she had been. Has had a headache, feeling sort of  sluggish.  Thinks headaches started a month ago, dull, intermittent. Uses Tylenol  or heat therapy if needed. Feeling better today and yesterday. Appetite similar to slightly decrease. Thinks has lost some weight. Denies vaginal bleeding, abdominal or pelvic pain. Had a nice Thanksgiving with family last week.  Past Medical/Surgical History: Past Medical History:  Diagnosis Date   Anxiety attack    take RX prn (08/13/2013)   Arthritis    probably (08/13/2013)   Atrial fibrillation (HCC)    new onset/pt report 08/13/2013   Bladder wall thickening 07/01/2021   CT scan in Epic   Bronchitis 12/2011   Cholelithiasis 07/01/2021   CT in Epic.   Common bile duct dilation 07/01/2021   CT in Epic:  13 mm.   Complication of anesthesia    Compression fracture of first lumbar vertebra (HCC) 07/01/2021   CT in Epic.   Depression    Femur fracture (HCC)    left proximal femur non-union   GERD (gastroesophageal reflux disease)    Grieving 05/03/2023   H/O hiatal hernia    Hardware complicating wound infection 08/22/2016   History of blood transfusion    w/both knee OR's (08/13/2013)   Hyperlipidemia    Hyperlipidemia    Hypertension    Hypertensive urgency 10/22/2018   Hypothyroidism    takes synthroid    Insomnia    Intertrochanteric fracture of right hip (HCC) 07/30/2015   Neuromuscular disorder (HCC)    carpal tunnel in right hand   Periprosthetic fracture around internal prosthetic right hip joint (HCC), nonunion basicervical femoral neck with broken hardware and subtrochanteric fracture 03/16/2016   PONV (postoperative nausea and vomiting)    Prosthetic hip infection 08/22/2016   Prosthetic joint infection 05/02/2023   Proteus infection 08/22/2016   Serratia infection 11/14/2016   Shingles    Skin cancer  of face 2000's   one on each side of my face and one on my nose (08/13/2013)   Swelling of extremity    sees Dr. Court, cardiologist 1 x year [phone]   Tendon dysfunction  08/22/2016   Wears hearing aid    [facility]    Past Surgical History:  Procedure Laterality Date   2D ECHOCARDIOGRAM  12/07/2004   EF 48%, moderate pulmonary hypertension   BREAST BIOPSY Left ?1970's   CARDIOVASCULAR STRESS TEST  12/07/2011   CARDIOVASCULAR STRESS TEST  12/07/2011   LEXISCAN , normal scan, no significant wall abnormalities noted   CARPAL TUNNEL RELEASE Right ~ 2005   CATARACT EXTRACTION, BILATERAL Bilateral 12/12/1998   CYSTOSCOPY  11/02/2021   Procedure: CYSTOSCOPY;  Surgeon: Viktoria Comer SAUNDERS, MD;  Location: [facility];  Service: Gynecology;;   DILATION AND CURETTAGE OF UTERUS  08/12/1949   DOPPLER ECHOCARDIOGRAPHY  12/07/2011   EYE SURGERY     FEMUR IM NAIL Right 08/01/2015   Procedure: INTRAMEDULLARY (IM) NAIL FEMORAL;  Surgeon: Fonda Olmsted, MD;  Location: [facility] OR;  Service: Orthopedics;  Laterality: Right;   HARDWARE REMOVAL Right 03/16/2016   Procedure: HARDWARE REMOVAL;  Surgeon: Fonda Olmsted, MD;  Location: [facility] OR;  Service: Orthopedics;  Laterality: Right;   INTRAMEDULLARY (IM) NAIL INTERTROCHANTERIC Left 01/26/2017   Procedure: INTRAMEDULLARY (IM) NAIL INTERTROCHANTRIC;  Surgeon: Kay CHRISTELLA Cummins, MD;  Location: [facility] OR;  Service: Orthopedics;  Laterality: Left;   JOINT REPLACEMENT     bilateral knee replacement   ORIF FEMUR FRACTURE Right 07/22/2024   Procedure: OPEN REDUCTION INTERNAL FIXATION (ORIF) DISTAL FEMUR FRACTURE;  Surgeon: Kendal Franky SQUIBB, MD;  Location: [facility] OR;  Service: Orthopedics;  Laterality: Right;   ROBOTIC ASSISTED BILATERAL SALPINGO OOPHERECTOMY Bilateral 11/02/2021   Procedure: XI ROBOTIC ASSISTED BILATERAL SALPINGO OOPHORECTOMY;  Surgeon: Viktoria Comer SAUNDERS, MD;  Location: [facility];  Service: Gynecology;  Laterality: Bilateral;   ROBOTIC ASSISTED TOTAL HYSTERECTOMY N/A 11/02/2021   Procedure: XI ROBOTIC ASSISTED TOTAL HYSTERECTOMY WITH  MINI LAPAROTOMY;  Surgeon: Viktoria Comer SAUNDERS, MD;  Location: [facility];  Service: Gynecology;  Laterality: N/A;    SKIN CANCER EXCISION  08/13/1999   off my nose (08/13/2013)   TOTAL HIP ARTHROPLASTY Right 03/16/2016   Procedure: TOTAL HIP ARTHROPLASTY;  Surgeon: Fonda Olmsted, MD;  Location: [facility];  Service: Orthopedics;  Laterality: Right;   TOTAL HIP ARTHROPLASTY Left 06/09/2017   Procedure: REMOVE LEFT HIP INTRAMEDULLARY NAIL AND CONVERT TO LEFT CEMENTED HEMI HIP ARTHROPLASTY;  Surgeon: Cummins Kay CHRISTELLA, MD;  Location: [facility];  Service: Orthopedics;  Laterality: Left;   TOTAL KNEE ARTHROPLASTY Right 12/13/2003   TOTAL KNEE ARTHROPLASTY  03/02/2012   Procedure: TOTAL KNEE ARTHROPLASTY;  Surgeon: LELON JONETTA Shari Mickey., MD;  Location: [facility] OR;  Service: Orthopedics;  Laterality: Left;    Family History  Problem Relation Age of Onset   Arrhythmia Mother    Heart disease Mother    Stroke Father 35   Arrhythmia Sister    Heart disease Sister    Heart disease Sister    Stroke Sister    Diabetes Sister    Heart disease Brother    Heart disease Brother    Cancer Brother        Colon cancer   Arrhythmia Brother    Heart disease Brother    Heart disease Child 46   Heart disease Child 32   Breast cancer Neg Hx    Ovarian cancer Neg Hx    Endometrial cancer Neg  Hx    Pancreatic cancer Neg Hx    Prostate cancer Neg Hx     Social History   Socioeconomic History   Marital status: Widowed    Spouse name: Not on file   Number of children: Not on file   Years of education: Not on file   Highest education level: Not on file  Occupational History   Not on file  Tobacco Use   Smoking status: Never   Smokeless tobacco: Never  Vaping Use   Vaping status: Never Used  Substance and Sexual Activity   Alcohol use: No   Drug use: No   Sexual activity: Not Currently  Other Topics Concern   Not on file  Social History Narrative   Widowed. Lives at [facility]. Son died in the [place] and she has not slept well since then.   Admitted to [facility] 01/30/17   Never smoked   Alcohol none    Full Code   Social Drivers of Health   Financial Resource Strain: Not on file  Food Insecurity: No Food Insecurity (07/21/2024)   Hunger Vital Sign    Worried About Running Out of Food in the Last Year: Never true    Ran Out of Food in the Last Year: Never true  Transportation Needs: No Transportation Needs (07/21/2024)   PRAPARE - Administrator, Civil Service (Medical): No    Lack of Transportation (Non-Medical): No  Physical Activity: Not on file  Stress: Not on file  Social Connections: Unknown (07/21/2024)   Social Connection and Isolation Panel    Frequency of Communication with Friends and Family: Three times a week    Frequency of Social Gatherings with Friends and Family: Three times a week    Attends Religious Services: 1 to 4 times per year    Active Member of Clubs or Organizations: No    Attends Banker Meetings: 1 to 4 times per year    Marital Status: Not on file    Current Medications:  Current Outpatient Medications:    Apoaequorin (PREVAGEN EXTRA STRENGTH) 20 MG CAPS, Take 1 tablet by mouth every evening., Disp: , Rfl:    chlorthalidone  (HYGROTON ) 25 MG tablet, Take 25 mg by mouth daily., Disp: , Rfl:    Cholecalciferol  2000 units CAPS, Take 2,000 Units by mouth every evening., Disp: , Rfl:    diltiazem  (CARDIZEM  CD) 180 MG 24 hr capsule, Take 1 capsule (180 mg total) by mouth daily., Disp: 90 capsule, Rfl: 0   ELIQUIS  2.5 MG TABS tablet, TAKE ONE TABLET BY MOUTH TWICE DAILY., Disp: 180 tablet, Rfl: 1   esomeprazole  (NEXIUM ) 20 MG capsule, Take 20 mg by mouth as needed., Disp: , Rfl:    furosemide  (LASIX ) 20 MG tablet, Take 20 mg by mouth as needed for edema., Disp: , Rfl:    lactose free nutrition (BOOST) LIQD, Take 237 mLs by mouth daily., Disp: , Rfl:    levothyroxine  (SYNTHROID ) 112 MCG tablet, Take 112 mcg by mouth daily before breakfast. , Disp: , Rfl:    metoprolol  tartrate (LOPRESSOR ) 50 MG tablet, Take 50 mg by mouth 2 (two) times  daily., Disp: , Rfl:    Multiple Vitamins-Minerals (PRESERVISION AREDS 2+MULTI VIT PO), Take 1 capsule by mouth in the morning and at bedtime., Disp: , Rfl:    oxyCODONE  (OXY IR/ROXICODONE ) 5 MG immediate release tablet, Take 0.5 tablets (2.5 mg total) by mouth every 6 (six) hours as needed for  severe pain (pain score 7-10)., Disp: 5 tablet, Rfl: 0   polyethylene glycol (MIRALAX  / GLYCOLAX ) 17 g packet, Take 17 g by mouth daily as needed for mild constipation., Disp: , Rfl:    polyethylene glycol (MIRALAX  / GLYCOLAX ) 17 g packet, Take 17 g by mouth daily. See as needed dosing also, Disp: , Rfl:    potassium chloride  (KLOR-CON  M) 10 MEQ tablet, Take 10 mEq by mouth as needed. And take 1 tablet by mouth when taking 1 of the 20 mg of Lasix ., Disp: , Rfl:    potassium chloride  (KLOR-CON ) 10 MEQ tablet, Take 10 mEq by mouth daily., Disp: , Rfl:    senna (SENOKOT) 8.6 MG tablet, Take 2 tablets by mouth at bedtime., Disp: , Rfl:    sulfamethoxazole -trimethoprim  (BACTRIM ) 400-80 MG tablet, Take 1 tablet by mouth 2 (two) times daily., Disp: 60 tablet, Rfl: 11   temazepam  (RESTORIL ) 30 MG capsule, Take 30 mg by mouth at bedtime as needed for sleep., Disp: , Rfl:   Review of Symptoms: Pertinent positives as per HPI.  Physical Exam: Deferred given limitations of phone visit.  Laboratory & Radiologic Studies: None new  Assessment & Plan: Leah Holland is a 89 y.o. 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   Surgery 10/2021.   Doing very well without symptoms concerning for recurrence.  She would like to continue with q 6 months phone visits.  She will call if any needs before our next visit.  I discussed the assessment and treatment plan with the patient. The patient was provided with an opportunity to ask questions and all were answered. The patient agreed with the plan and demonstrated an  understanding of the instructions.   The patient was advised to call back or see an in-person evaluation if the symptoms worsen or if the condition fails to improve as anticipated.   10 minutes of total time was spent for this patient encounter, including preparation, phone counseling with the patient and coordination of care, and documentation of the encounter.   Comer Dollar, MD  Division of Gynecologic Oncology  Department of Obstetrics and Gynecology  [facility]

## 2024-11-14 ENCOUNTER — Telehealth: Payer: Self-pay

## 2024-11-14 ENCOUNTER — Telehealth: Admitting: Gynecologic Oncology

## 2024-11-14 NOTE — Telephone Encounter (Signed)
-----   Message from Comer JONELLE Dollar sent at 11/13/2024  4:08 PM EST ----- Hi guys, Please schedule her for phone follow-up in 6 months. Thanks! Kat

## 2024-11-14 NOTE — Telephone Encounter (Signed)
 Per Dr.Tucker request. Leah Holland is scheduled for a 6 month phone visit on 05/14/25 @ 6:00pm. Pt agrees to date/time

## 2024-11-25 ENCOUNTER — Ambulatory Visit: Admitting: Cardiovascular Disease

## 2024-11-25 ENCOUNTER — Encounter: Payer: Self-pay | Admitting: Cardiovascular Disease

## 2024-11-25 VITALS — BP 130/68 | HR 71 | Ht 63.0 in | Wt 114.0 lb

## 2024-11-25 DIAGNOSIS — I10 Essential (primary) hypertension: Secondary | ICD-10-CM

## 2024-11-25 DIAGNOSIS — I480 Paroxysmal atrial fibrillation: Secondary | ICD-10-CM | POA: Insufficient documentation

## 2024-11-25 DIAGNOSIS — E782 Mixed hyperlipidemia: Secondary | ICD-10-CM

## 2024-11-25 MED ORDER — DILTIAZEM HCL ER COATED BEADS 180 MG PO CP24: 180.0000 mg | ORAL_CAPSULE | Freq: Every day | ORAL | 3 refills | Status: AC

## 2024-11-25 NOTE — Assessment & Plan Note (Signed)
 History of persistent A-fib rate controlled on low-dose Eliquis .

## 2024-11-25 NOTE — Progress Notes (Signed)
 11/25/2024 Leah Holland   Sep 05, 1922  [id number]  Primary Physician Delayne Artist PARAS, MD Primary Cardiologist: Dorn PARAS Lesches MD FACP, [place], [place], [place]  HPI:  Leah Holland is a 89 y.o.   widowed, Caucasian female, mother of two living children (one deceased), grandmother to five grandchildren, who I last saw 11/22/2023.SABRA She is the youngest of 10 children. Her sister, Almarie Marc was also a patient i of mine that has passed away. She had one son who died , one son named Lemond who is now a retired optometrist , who accompanies her today,  She has a history of hypertension and hyperlipidemia, as well as family history of heart disease. She had a remote right total knee replacement and a recent left one by Dr. Shari. She had a Myoview stress test performed in our office on August 07, 2011, which was low risk. Recent lab work performed by Dr. Alyse revealed a total cholesterol of 133, HDL of 71, and HDL of 39.  Patient presented with apparent new onset atrial fibrillation with RVR. She was just seen at [facility] on 08/03/2013 for chest pain. She was diagnosed with gastroesophageal reflux disease and a UTI. At that time she was in normal sinus rhythm at a rate of 79 beats per minute and with PACs. She reported dizziness, feeling tired and a racing HR since ~Sat/Sun. She otherwise denied nausea, vomiting, fever, chest pain, dizziness, PND, cough, congestion, abdominal pain, hematochezia, melena, lower extremity edema, claudication. No recent GIB.  She was admitted and started on IV diltiazem  5mg /hr and Eliquis  2.5mg  twice daily. She converted to NSR at 0246hrs. She was changed to PO Cardizem  120mg  daily. She's been taking lasix  60mg  daily for LEE for a long time. This was changed to 20mg  daily as needed a long with 10meq as needed potassium.    Her major issues had been with recurrent right and left hip fractures requiring surgical intervention.  She has recuperated  over the last year and is currently walking with a walker.    Her son who is the retired pediatrician feels that she may have an element of orthostatic hypotension which has led to her falls in the past and was recommended that she sit for a while before she stands up.   Since I saw her a year ago she is remained stable.    She lives at [facility] for the last 12 years..  She walks with a walker.  She does have some mild orthostasis but gets up slowly.  She denies chest pain or shortness of breath.  She recently fractured her right femur and had surgery.  She has recuperated after getting physical therapy.  She uses a motorized wheelchair around [facility] home.   Active Medications[1]   Allergies[2]  Social History   Socioeconomic History   Marital status: Widowed    Spouse name: Not on file   Number of children: Not on file   Years of education: Not on file   Highest education level: Not on file  Occupational History   Not on file  Tobacco Use   Smoking status: Never   Smokeless tobacco: Never  Vaping Use   Vaping status: Never Used  Substance and Sexual Activity   Alcohol use: No   Drug use: No   Sexual activity: Not Currently  Other Topics Concern   Not on file  Social History Narrative   Widowed. Lives at [facility]. Son died  in the [place] War and she has not slept well since then.   Admitted to [facility] 01/30/17   Never smoked   Alcohol none   Full Code   Social Drivers of Health   Tobacco Use: Low Risk (11/25/2024)   Patient History    Smoking Tobacco Use: Never    Smokeless Tobacco Use: Never    Passive Exposure: Not on file  Financial Resource Strain: Not on file  Food Insecurity: No Food Insecurity (07/21/2024)   Epic    Worried About Programme Researcher, Broadcasting/film/video in the Last Year: Never true    Ran Out of Food in the Last Year: Never true  Transportation Needs: No Transportation Needs (07/21/2024)   Epic    Lack of Transportation (Medical): No     Lack of Transportation (Non-Medical): No  Physical Activity: Not on file  Stress: Not on file  Social Connections: Unknown (07/21/2024)   Social Connection and Isolation Panel    Frequency of Communication with Friends and Family: Three times a week    Frequency of Social Gatherings with Friends and Family: Three times a week    Attends Religious Services: 1 to 4 times per year    Active Member of Clubs or Organizations: No    Attends Banker Meetings: 1 to 4 times per year    Marital Status: Not on file  Intimate Partner Violence: Not At Risk (07/21/2024)   Epic    Fear of Current or Ex-Partner: No    Emotionally Abused: No    Physically Abused: No    Sexually Abused: No  Depression (PHQ2-9): Low Risk (02/28/2022)   Depression (PHQ2-9)    PHQ-2 Score: 0  Alcohol Screen: Not on file  Housing: Low Risk (07/21/2024)   Epic    Unable to Pay for Housing in the Last Year: No    Number of Times Moved in the Last Year: 0    Homeless in the Last Year: No  Utilities: Not At Risk (07/21/2024)   Epic    Threatened with loss of utilities: No  Health Literacy: Not on file     Review of Systems: General: negative for chills, fever, night sweats or weight changes.  Cardiovascular: negative for chest pain, dyspnea on exertion, edema, orthopnea, palpitations, paroxysmal nocturnal dyspnea or shortness of breath Dermatological: negative for rash Respiratory: negative for cough or wheezing Urologic: negative for hematuria Abdominal: negative for nausea, vomiting, diarrhea, bright red blood per rectum, melena, or hematemesis Neurologic: negative for visual changes, syncope, or dizziness All other systems reviewed and are otherwise negative except as noted above.    Blood pressure 130/68, pulse 71, height 5' 3 (1.6 m), weight 114 lb (51.7 kg), SpO2 95%.  General appearance: alert and no distress Neck: no adenopathy, no carotid bruit, no JVD, supple, symmetrical, trachea midline, and  thyroid  not enlarged, symmetric, no tenderness/mass/nodules Lungs: clear to auscultation bilaterally Heart: irregularly irregular rhythm Extremities: extremities normal, atraumatic, no cyanosis or edema Pulses: 2+ and symmetric Skin: Skin color, texture, turgor normal. No rashes or lesions Neurologic: Grossly normal  EKG not performed today      ASSESSMENT AND PLAN:   Hypertension History of essential hypertension blood pressure measured today at 130/68.  She is on chlorthalidone , diltiazem  and metoprolol .  Hyperlipidemia History of hyperlipidemia not on statin therapy lipid profile performed 11/15/2024 revealing a total cholesterol of 179.  Paroxysmal atrial fibrillation (HCC) History of persistent A-fib rate controlled on low-dose Eliquis .  Dorn DOROTHA Lesches MD FACP,FACC,FAHA, FSCAI 11/25/2024 3:51 PM    [1]  Current Meds  Medication Sig   chlorthalidone  (HYGROTON ) 25 MG tablet Take 25 mg by mouth daily.   Cholecalciferol  2000 units CAPS Take 2,000 Units by mouth every evening.   ELIQUIS  2.5 MG TABS tablet TAKE ONE TABLET BY MOUTH TWICE DAILY.   esomeprazole  (NEXIUM ) 20 MG capsule Take 20 mg by mouth as needed.   furosemide  (LASIX ) 20 MG tablet Take 20 mg by mouth as needed for edema.   lactose free nutrition (BOOST) LIQD Take 237 mLs by mouth daily.   levothyroxine  (SYNTHROID ) 112 MCG tablet Take 112 mcg by mouth daily before breakfast.    metoprolol  tartrate (LOPRESSOR ) 50 MG tablet Take 50 mg by mouth 2 (two) times daily.   polyethylene glycol (MIRALAX  / GLYCOLAX ) 17 g packet Take 17 g by mouth daily as needed for mild constipation.   potassium chloride  (KLOR-CON  M) 10 MEQ tablet Take 10 mEq by mouth as needed. And take 1 tablet by mouth when taking 1 of the 20 mg of Lasix .   sulfamethoxazole -trimethoprim  (BACTRIM ) 400-80 MG tablet Take 1 tablet by mouth 2 (two) times daily.   temazepam  (RESTORIL ) 30 MG capsule Take 30 mg by mouth at bedtime as needed for sleep.    [DISCONTINUED] diltiazem  (CARDIZEM  CD) 180 MG 24 hr capsule Take 1 capsule (180 mg total) by mouth daily.  [2]  Allergies Allergen Reactions   Levaquin [Levofloxacin] Other (See Comments)    tendinopathy   Morphine  And Codeine Other (See Comments)    Made pt wild   Morphine  Sulfate     Other reaction(s): made pt wild

## 2024-11-25 NOTE — Patient Instructions (Signed)

## 2024-11-25 NOTE — Assessment & Plan Note (Signed)
 History of essential hypertension blood pressure measured today at 130/68.  She is on chlorthalidone , diltiazem  and metoprolol .

## 2024-11-25 NOTE — Addendum Note (Signed)
 Addended by: LORRENE FEDERICO CROME on: 11/25/2024 04:20 PM   Modules accepted: Orders

## 2024-11-25 NOTE — Assessment & Plan Note (Signed)
 History of hyperlipidemia not on statin therapy lipid profile performed 11/15/2024 revealing a total cholesterol of 179.

## 2025-05-14 ENCOUNTER — Inpatient Hospital Stay: Attending: Gynecologic Oncology | Admitting: Gynecologic Oncology
